# Patient Record
Sex: Male | Born: 1945 | Race: White | Hispanic: No | Marital: Married | Smoking: Never smoker
Health system: Southern US, Community
[De-identification: ages and names within clinical notes are randomized; demographics above are authoritative.]

## PROBLEM LIST (undated history)

## (undated) DIAGNOSIS — N200 Calculus of kidney: Secondary | ICD-10-CM

## (undated) DIAGNOSIS — N183 Chronic kidney disease, stage 3 unspecified: Secondary | ICD-10-CM

## (undated) DIAGNOSIS — G4730 Sleep apnea, unspecified: Secondary | ICD-10-CM

## (undated) DIAGNOSIS — I10 Essential (primary) hypertension: Secondary | ICD-10-CM

## (undated) HISTORY — PX: MANDIBLE FRACTURE SURGERY: SHX706

## (undated) HISTORY — PX: CATARACT EXTRACTION: SUR2

## (undated) HISTORY — PX: OTHER SURGICAL HISTORY: SHX169

---

## 2019-05-25 DIAGNOSIS — I10 Essential (primary) hypertension: Secondary | ICD-10-CM | POA: Diagnosis present

## 2019-06-12 ENCOUNTER — Ambulatory Visit: Payer: Medicare HMO | Admitting: Psychology

## 2019-07-20 DIAGNOSIS — F411 Generalized anxiety disorder: Secondary | ICD-10-CM | POA: Insufficient documentation

## 2021-02-10 ENCOUNTER — Encounter (HOSPITAL_BASED_OUTPATIENT_CLINIC_OR_DEPARTMENT_OTHER): Payer: Self-pay | Admitting: Emergency Medicine

## 2021-02-10 ENCOUNTER — Other Ambulatory Visit: Payer: Self-pay

## 2021-02-10 ENCOUNTER — Emergency Department (HOSPITAL_BASED_OUTPATIENT_CLINIC_OR_DEPARTMENT_OTHER): Payer: Medicare (Managed Care)

## 2021-02-10 ENCOUNTER — Emergency Department (HOSPITAL_BASED_OUTPATIENT_CLINIC_OR_DEPARTMENT_OTHER)
Admission: EM | Admit: 2021-02-10 | Discharge: 2021-02-10 | Disposition: A | Discharge status: Home / Self Care | Payer: Medicare (Managed Care) | Attending: Emergency Medicine | Admitting: Emergency Medicine

## 2021-02-10 DIAGNOSIS — R109 Unspecified abdominal pain: Secondary | ICD-10-CM | POA: Diagnosis present

## 2021-02-10 DIAGNOSIS — N202 Calculus of kidney with calculus of ureter: Secondary | ICD-10-CM | POA: Diagnosis not present

## 2021-02-10 DIAGNOSIS — Z87442 Personal history of urinary calculi: Secondary | ICD-10-CM | POA: Insufficient documentation

## 2021-02-10 DIAGNOSIS — I10 Essential (primary) hypertension: Secondary | ICD-10-CM | POA: Insufficient documentation

## 2021-02-10 DIAGNOSIS — N200 Calculus of kidney: Secondary | ICD-10-CM

## 2021-02-10 HISTORY — DX: Sleep apnea, unspecified: G47.30

## 2021-02-10 HISTORY — DX: Essential (primary) hypertension: I10

## 2021-02-10 HISTORY — DX: Calculus of kidney: N20.0

## 2021-02-10 LAB — CBC WITH DIFFERENTIAL/PLATELET
Abs Immature Granulocytes: 0.02 10*3/uL (ref 0.00–0.07)
Basophils Absolute: 0 10*3/uL (ref 0.0–0.1)
Basophils Relative: 1 %
Eosinophils Absolute: 0.2 10*3/uL (ref 0.0–0.5)
Eosinophils Relative: 3 %
HCT: 46 % (ref 39.0–52.0)
Hemoglobin: 15 g/dL (ref 13.0–17.0)
Immature Granulocytes: 0 %
Lymphocytes Relative: 19 %
Lymphs Abs: 1.5 10*3/uL (ref 0.7–4.0)
MCH: 28.7 pg (ref 26.0–34.0)
MCHC: 32.6 g/dL (ref 30.0–36.0)
MCV: 88.1 fL (ref 80.0–100.0)
Monocytes Absolute: 0.7 10*3/uL (ref 0.1–1.0)
Monocytes Relative: 8 %
Neutro Abs: 5.5 10*3/uL (ref 1.7–7.7)
Neutrophils Relative %: 69 %
Platelets: 167 10*3/uL (ref 150–400)
RBC: 5.22 MIL/uL (ref 4.22–5.81)
RDW: 14.6 % (ref 11.5–15.5)
WBC: 7.9 10*3/uL (ref 4.0–10.5)
nRBC: 0 % (ref 0.0–0.2)

## 2021-02-10 LAB — URINALYSIS, ROUTINE W REFLEX MICROSCOPIC
Bilirubin Urine: NEGATIVE
Glucose, UA: NEGATIVE mg/dL
Ketones, ur: 15 mg/dL — AB
Nitrite: NEGATIVE
Protein, ur: >300 mg/dL — AB
RBC / HPF: >50 RBC/hpf — ABNORMAL HIGH (ref 0–5)
Specific Gravity, Urine: 1.025 (ref 1.005–1.030)
WBC, UA: >50 WBC/hpf — ABNORMAL HIGH (ref 0–5)
pH: 7 (ref 5.0–8.0)

## 2021-02-10 LAB — BASIC METABOLIC PANEL
Anion gap: 10 (ref 5–15)
BUN: 21 mg/dL (ref 8–23)
CO2: 27 mmol/L (ref 22–32)
Calcium: 9.3 mg/dL (ref 8.9–10.3)
Chloride: 103 mmol/L (ref 98–111)
Creatinine, Ser: 1.55 mg/dL — ABNORMAL HIGH (ref 0.61–1.24)
GFR, Estimated: 47 mL/min — ABNORMAL LOW (ref >60)
Glucose, Bld: 87 mg/dL (ref 70–99)
Potassium: 3.8 mmol/L (ref 3.5–5.1)
Sodium: 140 mmol/L (ref 135–145)

## 2021-02-10 IMAGING — CT CT RENAL STONE PROTOCOL
2 of 4 series · 16 of 46 positions shown, 18 images · non-contrast
Comparison: None.

CLINICAL DATA: Bilateral flank pain.  Hematuria.

EXAM:
CT ABDOMEN AND PELVIS WITHOUT CONTRAST
TECHNIQUE: Multidetector CT imaging of the abdomen and pelvis was performed
following the standard protocol without IV contrast.

[Series 2: stone full · axial · 0.91mm/px · z∈[+830,+1255]mm · 13 of 93 slices shown, 15 images]
[im 4/93  soft-tissue]
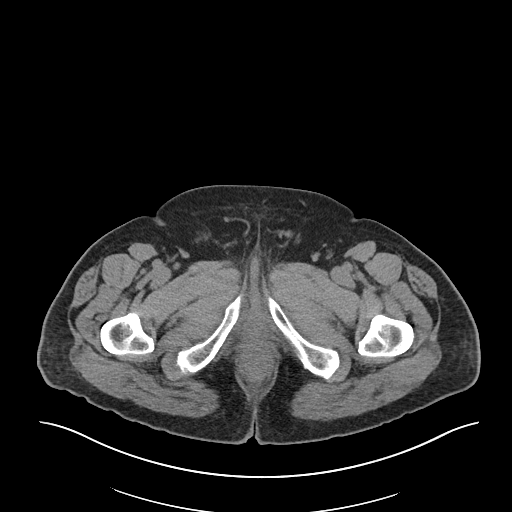
[im 4/93  bone]
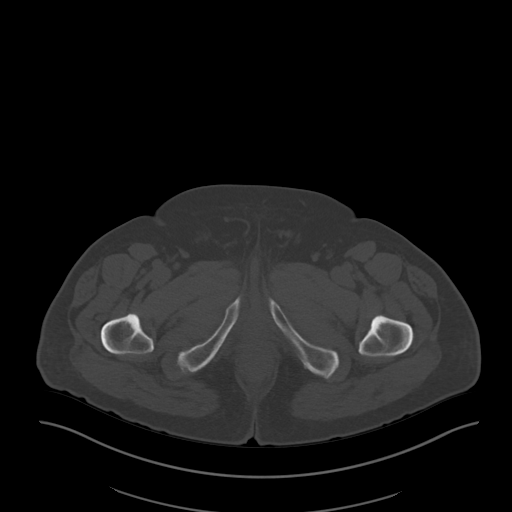
[im 12/93  soft-tissue]
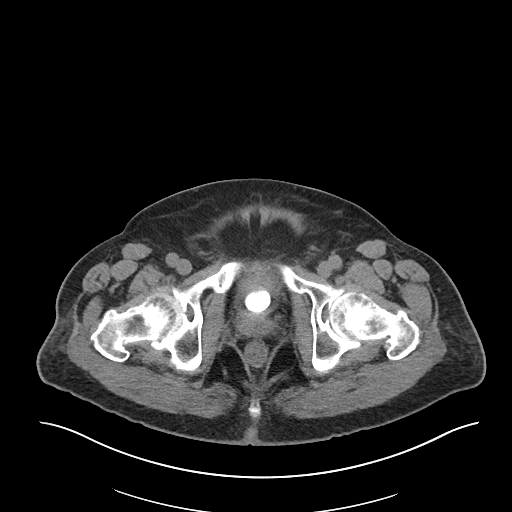
[im 20/93  soft-tissue]
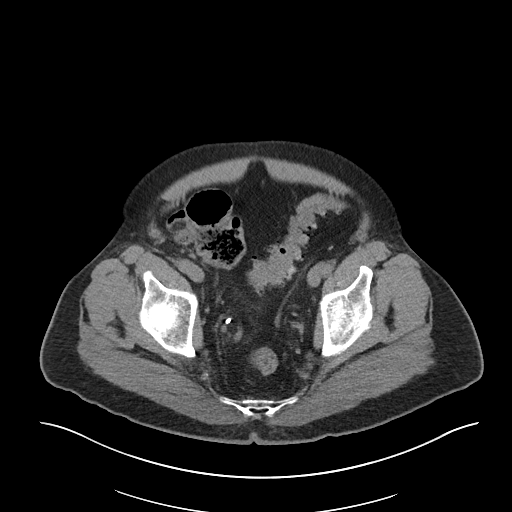
[im 27/93  soft-tissue]
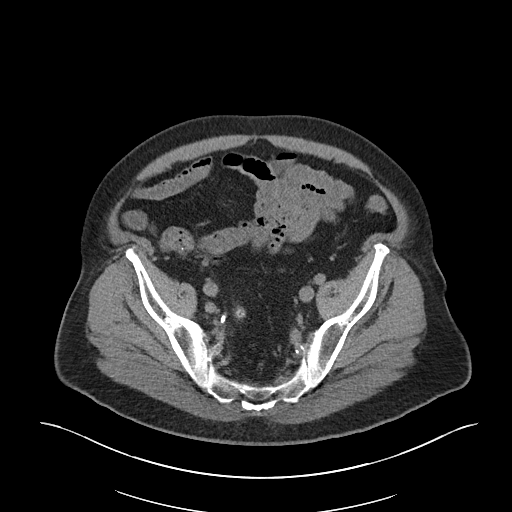
[im 31/93  soft-tissue]
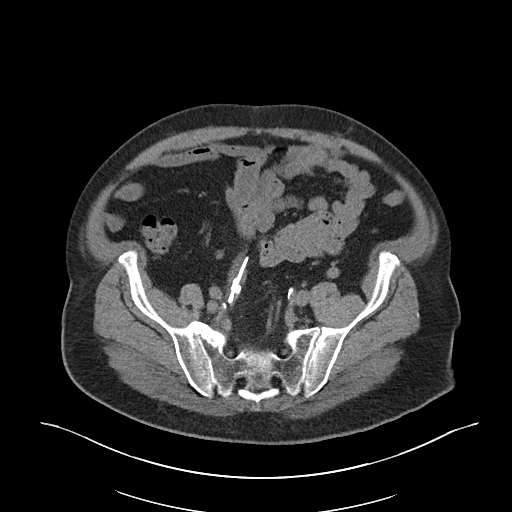
[im 39/93  soft-tissue]
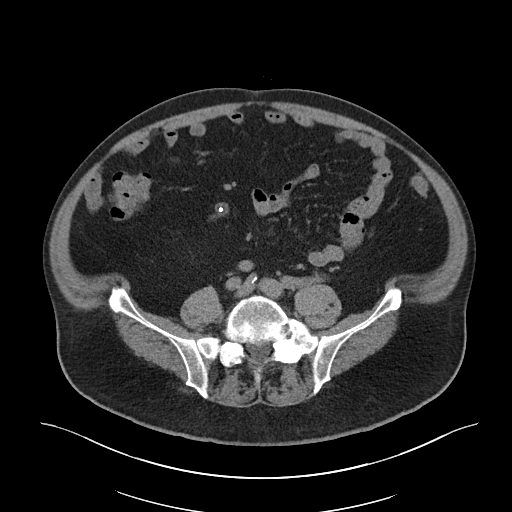
[im 47/93  soft-tissue]
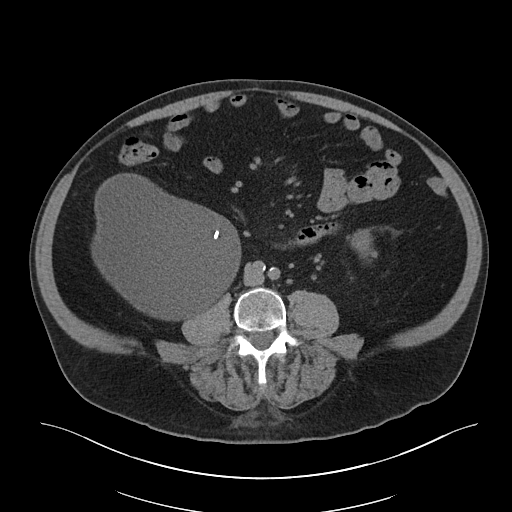
[im 54/93  soft-tissue]
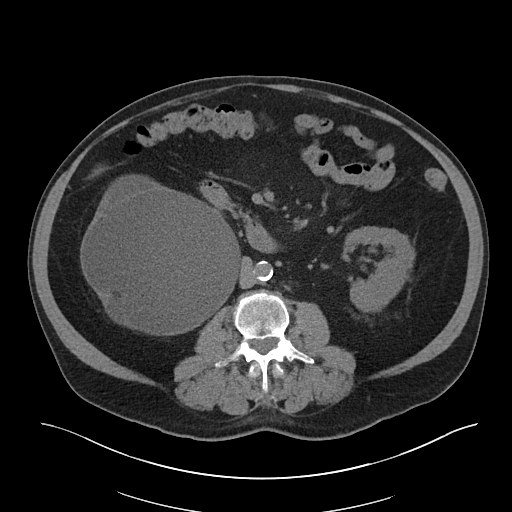
[im 62/93  soft-tissue]
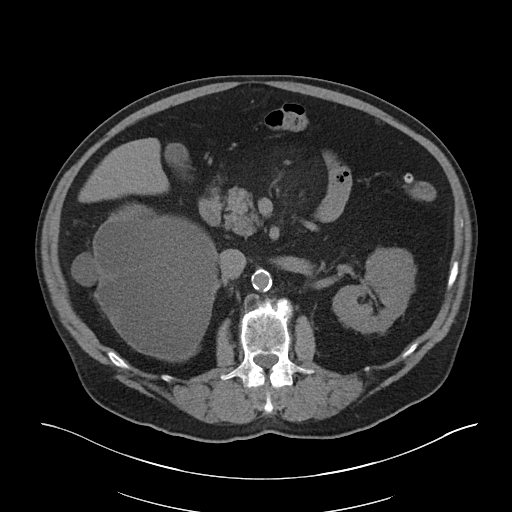
[im 62/93  bone]
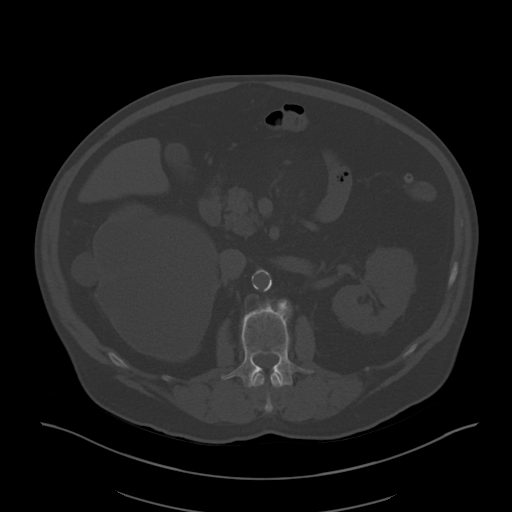
[im 66/93  soft-tissue]
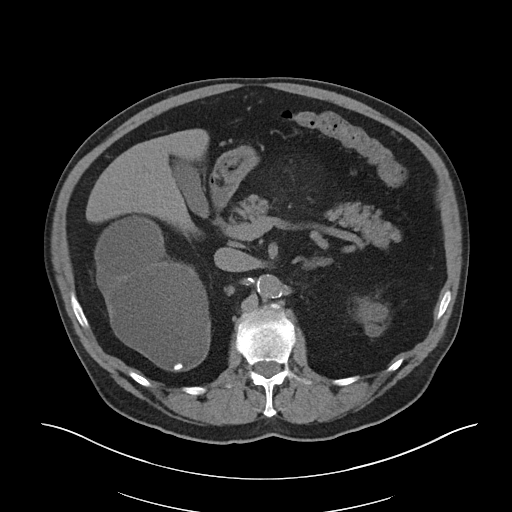
[im 73/93  soft-tissue]
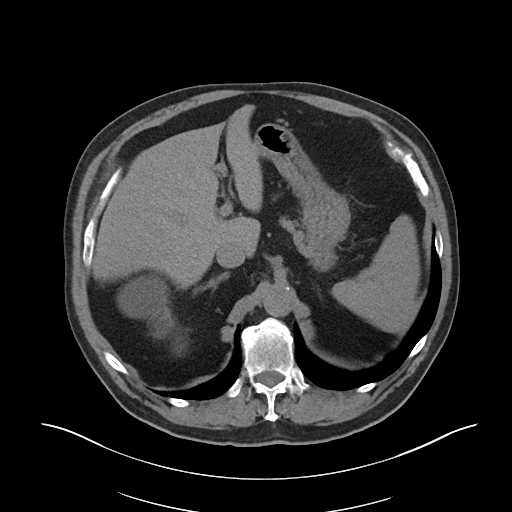
[im 81/93  soft-tissue]
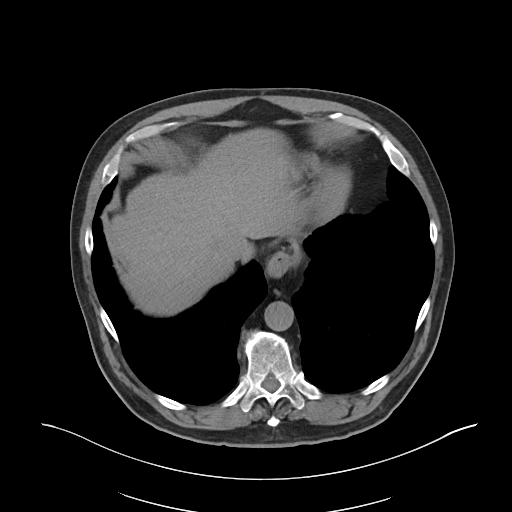
[im 89/93  soft-tissue]
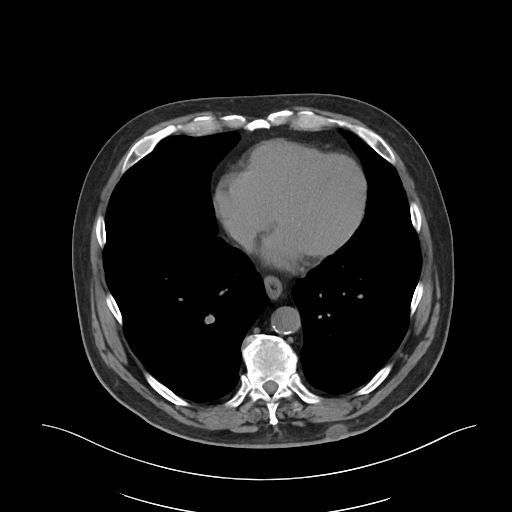

[Series 5: coronal · coronal · 0.92mm/px · 3 of 113 slices shown]
[im 38/113  soft-tissue]
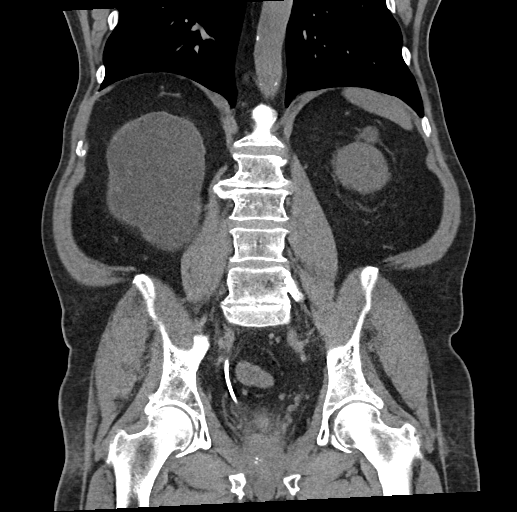
[im 50/113  soft-tissue]
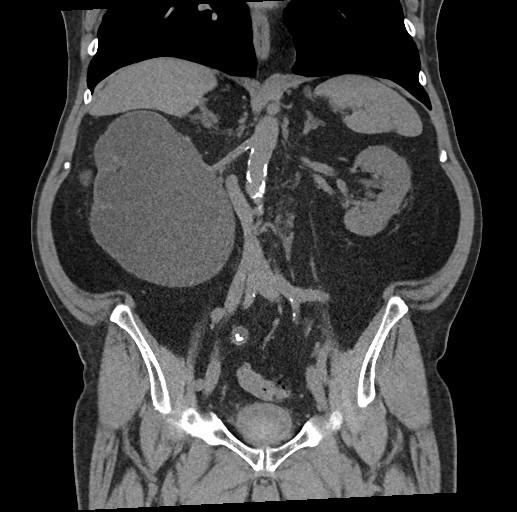
[im 63/113  soft-tissue]
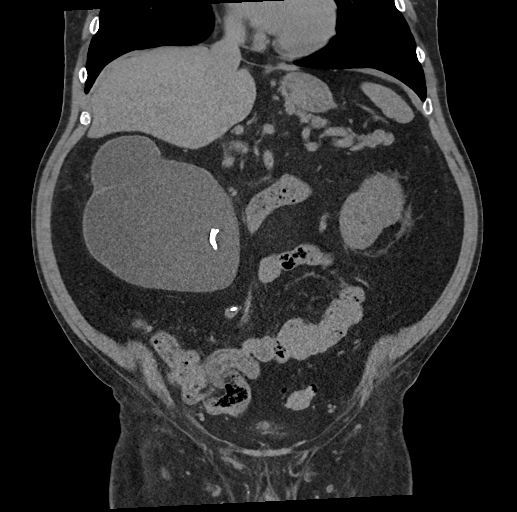

[16 of 46 positions shown; findings below may reference images not displayed]

FINDINGS: Lower chest: No acute findings.

Hepatobiliary: No mass visualized on this unenhanced exam.
Gallbladder is unremarkable. No evidence of biliary ductal
dilatation.

Pancreas: No mass or inflammatory process visualized on this
unenhanced exam.

Spleen:  Within normal limits in size.

Adrenals/Urinary tract: A few tiny fluid attenuation and hemorrhagic
renal cysts are noted. Severe right hydroureteronephrosis is seen
with diffuse right renal parenchymal atrophy. A right ureteral stent
is seen in appropriate position. Several calculi are seen adjacent
to the stent in the mid right ureter measuring up to 6 mm. An 8 mm
nonobstructing calculus is seen in the right renal collecting
system. A 2.8 cm calculus is seen in the urinary bladder, which has
formed around the distal pigtail loop of the ureteral stent.

Stomach/Bowel: No evidence of obstruction, inflammatory process, or
abnormal fluid collections. Diverticulosis is seen mainly involving
the descending and sigmoid colon, however there is no evidence of
diverticulitis.

Vascular/Lymphatic: No pathologically enlarged lymph nodes
identified. No evidence of abdominal aortic aneurysm. Aortic
atherosclerotic calcification noted.

Reproductive:  No mass or other significant abnormality.

Other:  None.

Musculoskeletal:  No suspicious bone lesions identified.
IMPRESSION: Severe right hydroureteronephrosis with diffuse right renal
parenchymal atrophy.

Right ureteral stent in appropriate position, with several calculi
in the mid right ureter measuring up to 6 mm.

Large urinary bladder calculus which is formed around the distal
pigtail loop of the ureteral stent. Stent malfunction cannot be
excluded.

Colonic diverticulosis, without radiographic evidence of
diverticulitis.

Aortic Atherosclerosis ([XL]-[XL]).

## 2021-02-10 MED ORDER — HYDROCODONE-ACETAMINOPHEN 5-325 MG PO TABS: 1.0000 | ORAL_TABLET | ORAL | 0 refills | Status: DC | PRN

## 2021-02-10 MED ORDER — SODIUM CHLORIDE 0.9 % IV SOLN
1.0000 g | Freq: Once | INTRAVENOUS | Status: AC
  Administered 2021-02-10: 1 g via INTRAVENOUS
  Filled 2021-02-10: qty 10

## 2021-02-10 MED ORDER — CEPHALEXIN 500 MG PO CAPS: 500.0000 mg | ORAL_CAPSULE | Freq: Two times a day (BID) | ORAL | 0 refills | Status: DC

## 2021-02-10 NOTE — ED Provider Notes (Signed)
[facility] EMERGENCY DEPT Provider Note   CSN: [id number] Arrival date & time: 02/10/21  1358     History Chief Complaint  Patient presents with   Flank Pain   Hematuria    Nicholas Mckenzie is a 75 y.o. male.  Patient is a 75 year old male who presents with hematuria.  He has a history of hypertension and prior kidney stones.  He states that over the last 4 days he has had some blood in his urine and pain on urination.  He denies any associated abdominal pain.  He does have some achiness across his low back.  No fevers.  No nausea or vomiting.  No dizziness.  He does have a prior history of kidney stones.  He states he was previously seen a urologist in [place] and he did have a kidney stone about 3 years ago where he had to have a stent placed.  He reports that the stent is still in place and he never had it removed once he moved here.  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      Past Medical History:  Diagnosis Date   Hypertension    Kidney stones    Sleep apnea     There are no problems to display for this patient.   Past Surgical History:  Procedure Laterality Date   CATARACT EXTRACTION     kidney stent     MANDIBLE FRACTURE SURGERY         No family history on file.     Home Medications Prior to Admission medications   Medication Sig Start Date End Date Taking? Authorizing Provider  cephALEXin (KEFLEX) 500 MG capsule Take 1 capsule (500 mg total) by mouth 2 (two) times daily. 02/10/21  Yes Rolan Bucco, MD  HYDROcodone-acetaminophen (NORCO/VICODIN) 5-325 MG tablet Take 1 tablet by mouth every 4 (four) hours as needed. 02/10/21  Yes Rolan Bucco, MD    Allergies    Patient has no known allergies.  Review of Systems   Review of Systems  Constitutional:  Negative for chills, diaphoresis, fatigue and fever.  HENT:  Negative for congestion, rhinorrhea and sneezing.   Eyes: Negative.    Respiratory:  Negative for cough, chest tightness and shortness of breath.   Cardiovascular:  Negative for chest pain and leg swelling.  Gastrointestinal:  Negative for abdominal pain, blood in stool, diarrhea, nausea and vomiting.  Genitourinary:  Positive for difficulty urinating and hematuria. Negative for flank pain and frequency.  Musculoskeletal:  Positive for back pain. Negative for arthralgias.  Skin:  Negative for rash.  Neurological:  Negative for dizziness, speech difficulty, weakness, numbness and headaches.   Physical Exam Updated Vital Signs BP (!) 170/90   Pulse (!) 45   Temp 98.1 F (36.7 C) (Oral)   Resp 18   Ht 5\' 8"  (1.727 m)   Wt 104.3 kg   SpO2 97%   BMI 34.97 kg/m   Physical Exam Constitutional:      Appearance: He is well-developed.  HENT:     Head: Normocephalic and atraumatic.  Eyes:     Pupils: Pupils are equal, round, and reactive to light.  Cardiovascular:     Rate and Rhythm: Normal rate and regular rhythm.     Heart sounds: Normal heart sounds.  Pulmonary:     Effort: Pulmonary effort is normal. No respiratory distress.     Breath sounds: Normal breath sounds. No wheezing or rales.  Chest:     Chest wall: No tenderness.  Abdominal:     General: Bowel sounds are normal.     Palpations: Abdomen is soft.     Tenderness: There is no abdominal tenderness. There is no guarding or rebound.  Musculoskeletal:        General: Normal range of motion.     Cervical back: Normal range of motion and neck supple.  Lymphadenopathy:     Cervical: No cervical adenopathy.  Skin:    General: Skin is warm and dry.     Findings: No rash.  Neurological:     Mental Status: He is alert and oriented to person, place, and time.    ED Results / Procedures / Treatments   Labs (all labs ordered are listed, but only abnormal results are displayed) Labs Reviewed  URINALYSIS, ROUTINE W REFLEX MICROSCOPIC - Abnormal; Notable for the following components:       Result Value   APPearance CLOUDY (*)    Hgb urine dipstick MODERATE (*)    Ketones, ur 15 (*)    Protein, ur >300 (*)    Leukocytes,Ua LARGE (*)    RBC / HPF >50 (*)    WBC, UA >50 (*)    Bacteria, UA MANY (*)    All other components within normal limits  BASIC METABOLIC PANEL - Abnormal; Notable for the following components:   Creatinine, Ser 1.55 (*)    GFR, Estimated 47 (*)    All other components within normal limits  CBC WITH DIFFERENTIAL/PLATELET    EKG None  Radiology CT Renal Stone Study  Result Date: 02/10/2021 CLINICAL DATA:  Bilateral flank pain.  Hematuria. EXAM: CT ABDOMEN AND PELVIS WITHOUT CONTRAST TECHNIQUE: Multidetector CT imaging of the abdomen and pelvis was performed following the standard protocol without IV contrast. COMPARISON:  None. FINDINGS: Lower chest: No acute findings. Hepatobiliary: No mass visualized on this unenhanced exam. Gallbladder is unremarkable. No evidence of biliary ductal dilatation. Pancreas: No mass or inflammatory process visualized on this unenhanced exam. Spleen:  Within normal limits in size. Adrenals/Urinary tract: A few tiny fluid attenuation and hemorrhagic renal cysts are noted. Severe right hydroureteronephrosis is seen with diffuse right renal parenchymal atrophy. A right ureteral stent is seen in appropriate position. Several calculi are seen adjacent to the stent in the mid right ureter measuring up to 6 mm. An 8 mm nonobstructing calculus is seen in the right renal collecting system. A 2.8 cm calculus is seen in the urinary bladder, which has formed around the distal pigtail loop of the ureteral stent. Stomach/Bowel: No evidence of obstruction, inflammatory process, or abnormal fluid collections. Diverticulosis is seen mainly involving the descending and sigmoid colon, however there is no evidence of diverticulitis. Vascular/Lymphatic: No pathologically enlarged lymph nodes identified. No evidence of abdominal aortic aneurysm. Aortic  atherosclerotic calcification noted. Reproductive:  No mass or other significant abnormality. Other:  None. Musculoskeletal:  No suspicious bone lesions identified. IMPRESSION: Severe right hydroureteronephrosis with diffuse right renal parenchymal atrophy. Right ureteral stent in appropriate position, with several calculi in the mid right ureter measuring up to 6 mm. Large urinary bladder calculus which is formed around the distal pigtail loop of the ureteral stent. Stent malfunction cannot be excluded. Colonic diverticulosis, without radiographic evidence of diverticulitis. Aortic Atherosclerosis (ICD10-I70.0). Electronically Signed   By: Danae Orleans M.D.   On: 02/10/2021 17:12    Procedures Procedures   Medications Ordered in ED Medications  cefTRIAXone (ROCEPHIN) 1 g in sodium chloride  0.9 % 100 mL IVPB (1 g Intravenous New Bag/Given 02/10/21 1819)    ED Course  I have reviewed the triage vital signs and the nursing notes.  Pertinent labs & imaging results that were available during my care of the patient were reviewed by me and considered in my medical decision making (see chart for details).    MDM Rules/Calculators/A&P                           Patient is a 75 year old male who presents with hematuria and back pain.  His urine does show suggestions of infection.  His creatinine is 1.55 which is similar to his prior value from July of 1.46.  His hemoglobin is stable.  He is afebrile.  His white count is normal.  CT scan shows multiple stones in the right ureter with severe hydronephrosis.  A stent is still in place with some calcifications distally.  I spoke with Dr. Berneice Heinrich with urology.  He feels that patient can be discharged home with outpatient follow-up.  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  He does not have a fever or elevated white count.  He was started on antibiotics.  He was given dose Rocephin here.  He was given prescription for Vicodin.  He was given strict return  precautions and strict instructions to have close follow-up with urology. Final Clinical Impression(s) / ED Diagnoses Final diagnoses:  Kidney stone    Rx / DC Orders ED Discharge Orders          Ordered    HYDROcodone-acetaminophen (NORCO/VICODIN) 5-325 MG tablet  Every 4 hours PRN        02/10/21 1909    cephALEXin (KEFLEX) 500 MG capsule  2 times daily        02/10/21 1909             Rolan Bucco, MD 02/10/21 1911

## 2021-02-10 NOTE — ED Triage Notes (Signed)
Patient presents ambulatory c/o bilateral flank pain, hematuria, and hypertension since Tuesday. 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

## 2021-02-10 NOTE — Discharge Instructions (Addendum)
Call on Monday to schedule an appointment with the urologist.  Take the antibiotics as directed.  Return here as needed if you have any worsening symptoms.

## 2021-03-10 ENCOUNTER — Encounter (HOSPITAL_COMMUNITY): Payer: Self-pay

## 2021-03-10 ENCOUNTER — Observation Stay (HOSPITAL_COMMUNITY)
Admission: EM | Admit: 2021-03-10 | Discharge: 2021-03-12 | Disposition: A | Discharge status: Home / Self Care | Payer: Medicare (Managed Care) | Attending: Internal Medicine | Admitting: Internal Medicine

## 2021-03-10 DIAGNOSIS — Z20822 Contact with and (suspected) exposure to covid-19: Secondary | ICD-10-CM | POA: Diagnosis not present

## 2021-03-10 DIAGNOSIS — N1831 Chronic kidney disease, stage 3a: Secondary | ICD-10-CM | POA: Insufficient documentation

## 2021-03-10 DIAGNOSIS — Z79899 Other long term (current) drug therapy: Secondary | ICD-10-CM | POA: Insufficient documentation

## 2021-03-10 DIAGNOSIS — N1832 Chronic kidney disease, stage 3b: Secondary | ICD-10-CM | POA: Diagnosis present

## 2021-03-10 DIAGNOSIS — T424X1A Poisoning by benzodiazepines, accidental (unintentional), initial encounter: Secondary | ICD-10-CM | POA: Diagnosis not present

## 2021-03-10 DIAGNOSIS — Y900 Blood alcohol level of less than 20 mg/100 ml: Secondary | ICD-10-CM | POA: Insufficient documentation

## 2021-03-10 DIAGNOSIS — I129 Hypertensive chronic kidney disease with stage 1 through stage 4 chronic kidney disease, or unspecified chronic kidney disease: Secondary | ICD-10-CM | POA: Insufficient documentation

## 2021-03-10 DIAGNOSIS — T50911A Poisoning by multiple unspecified drugs, medicaments and biological substances, accidental (unintentional), initial encounter: Secondary | ICD-10-CM | POA: Diagnosis present

## 2021-03-10 DIAGNOSIS — R001 Bradycardia, unspecified: Secondary | ICD-10-CM | POA: Diagnosis present

## 2021-03-10 DIAGNOSIS — F3341 Major depressive disorder, recurrent, in partial remission: Secondary | ICD-10-CM

## 2021-03-10 DIAGNOSIS — I10 Essential (primary) hypertension: Secondary | ICD-10-CM | POA: Diagnosis present

## 2021-03-10 DIAGNOSIS — N183 Chronic kidney disease, stage 3 unspecified: Secondary | ICD-10-CM | POA: Diagnosis present

## 2021-03-10 DIAGNOSIS — G4730 Sleep apnea, unspecified: Secondary | ICD-10-CM | POA: Diagnosis present

## 2021-03-10 LAB — COMPREHENSIVE METABOLIC PANEL
ALT: 23 U/L (ref 0–44)
AST: 22 U/L (ref 15–41)
Albumin: 3.8 g/dL (ref 3.5–5.0)
Alkaline Phosphatase: 75 U/L (ref 38–126)
Anion gap: 8 (ref 5–15)
BUN: 26 mg/dL — ABNORMAL HIGH (ref 8–23)
CO2: 26 mmol/L (ref 22–32)
Calcium: 8.9 mg/dL (ref 8.9–10.3)
Chloride: 105 mmol/L (ref 98–111)
Creatinine, Ser: 1.45 mg/dL — ABNORMAL HIGH (ref 0.61–1.24)
GFR, Estimated: 51 mL/min — ABNORMAL LOW (ref >60)
Glucose, Bld: 96 mg/dL (ref 70–99)
Potassium: 3.7 mmol/L (ref 3.5–5.1)
Sodium: 139 mmol/L (ref 135–145)
Total Bilirubin: 0.5 mg/dL (ref 0.3–1.2)
Total Protein: 6.5 g/dL (ref 6.5–8.1)

## 2021-03-10 LAB — RAPID URINE DRUG SCREEN, HOSP PERFORMED
Amphetamines: NOT DETECTED
Barbiturates: NOT DETECTED
Benzodiazepines: POSITIVE — AB
Cocaine: NOT DETECTED
Opiates: NOT DETECTED
Tetrahydrocannabinol: NOT DETECTED

## 2021-03-10 LAB — CBC WITH DIFFERENTIAL/PLATELET
Abs Immature Granulocytes: 0.04 10*3/uL (ref 0.00–0.07)
Basophils Absolute: 0 10*3/uL (ref 0.0–0.1)
Basophils Relative: 0 %
Eosinophils Absolute: 0.2 10*3/uL (ref 0.0–0.5)
Eosinophils Relative: 3 %
HCT: 41 % (ref 39.0–52.0)
Hemoglobin: 13.2 g/dL (ref 13.0–17.0)
Immature Granulocytes: 1 %
Lymphocytes Relative: 20 %
Lymphs Abs: 1.3 10*3/uL (ref 0.7–4.0)
MCH: 29.1 pg (ref 26.0–34.0)
MCHC: 32.2 g/dL (ref 30.0–36.0)
MCV: 90.5 fL (ref 80.0–100.0)
Monocytes Absolute: 0.6 10*3/uL (ref 0.1–1.0)
Monocytes Relative: 8 %
Neutro Abs: 4.7 10*3/uL (ref 1.7–7.7)
Neutrophils Relative %: 68 %
Platelets: 185 10*3/uL (ref 150–400)
RBC: 4.53 MIL/uL (ref 4.22–5.81)
RDW: 14.6 % (ref 11.5–15.5)
WBC: 6.9 10*3/uL (ref 4.0–10.5)
nRBC: 0 % (ref 0.0–0.2)

## 2021-03-10 LAB — ACETAMINOPHEN LEVEL: Acetaminophen (Tylenol), Serum: <10 ug/mL — ABNORMAL LOW (ref 10–30)

## 2021-03-10 LAB — ETHANOL: Alcohol, Ethyl (B): <10 mg/dL (ref <10)

## 2021-03-10 LAB — SALICYLATE LEVEL: Salicylate Lvl: <7 mg/dL — ABNORMAL LOW (ref 7.0–30.0)

## 2021-03-10 MED ORDER — LACTATED RINGERS IV BOLUS
1000.0000 mL | Freq: Once | INTRAVENOUS | Status: AC
  Administered 2021-03-10: 1000 mL via INTRAVENOUS

## 2021-03-10 NOTE — ED Triage Notes (Signed)
Pt presents to the ED via EMS from [employer]. Per EMS, the pt was in the store and "watched the pt turn the bottle up" of 30mg  Temazepam tablets of a 3/4 of full bottle which is now empty. The pt denies he was not attempting to harm himself he "just wanted to go home."

## 2021-03-10 NOTE — ED Notes (Addendum)
Pt de-satted to 60% on room air with good pleth while sleeping. Pt sternal rubbed and arousable to pain. Pt's O2 increased to mid 90's on room air following this episode while awake, however, pt falls back asleep and placed on 6L [place] O2 at this time. ED provider notified.

## 2021-03-10 NOTE — ED Provider Notes (Addendum)
[facility]-EMERGENCY DEPT Provider Note   CSN: [id number] Arrival date & time: 03/10/21  1630     History Chief Complaint  Patient presents with   Drug Overdose    Temazepam OD intentional    Nicholas Mckenzie is a 75 y.o. male who presents emergency department for evaluation of an unintentional drug overdose.  History obtained from patient's partner as the patient is currently altered.  She states that at approximately 3 PM today the patient took an entire bottle of a combination of his home medications that primarily included temazepam but also may have included metoprolol succinate, amlodipine, fluoxetine, or valsartan.  The patient also consumed a single beer at the grocery store earlier today.  He presents significantly drowsy, bradycardic into the upper 40s, low 50s but hemodynamically stable and arousable to loud voice.  Denies chest pain, abdominal pain, nausea, vomiting, diarrhea or other systemic symptoms.   Drug Overdose      Past Medical History:  Diagnosis Date   Hypertension    Kidney stones    Sleep apnea     There are no problems to display for this patient.   Past Surgical History:  Procedure Laterality Date   CATARACT EXTRACTION     kidney stent     MANDIBLE FRACTURE SURGERY         History reviewed. No pertinent family history.     Home Medications Prior to Admission medications   Medication Sig Start Date End Date Taking? Authorizing Provider  cephALEXin (KEFLEX) 500 MG capsule Take 1 capsule (500 mg total) by mouth 2 (two) times daily. 02/10/21   Rolan Bucco, MD  HYDROcodone-acetaminophen (NORCO/VICODIN) 5-325 MG tablet Take 1 tablet by mouth every 4 (four) hours as needed. 02/10/21   Rolan Bucco, MD    Allergies    Patient has no known allergies.  Review of Systems   Review of Systems  Unable to perform ROS: Mental status change   Physical Exam Updated Vital Signs BP (!) 178/86   Pulse (!) 50   Temp 98.4 F (36.9  C) (Oral)   Resp (!) 22   SpO2 98%   Physical Exam Vitals and nursing note reviewed.  Constitutional:      Appearance: He is well-developed.  HENT:     Head: Normocephalic and atraumatic.  Eyes:     Conjunctiva/sclera: Conjunctivae normal.  Cardiovascular:     Rate and Rhythm: Regular rhythm. Bradycardia present.     Heart sounds: No murmur heard. Pulmonary:     Effort: Pulmonary effort is normal. No respiratory distress.     Breath sounds: Normal breath sounds.  Abdominal:     Palpations: Abdomen is soft.     Tenderness: There is no abdominal tenderness.  Musculoskeletal:     Cervical back: Neck supple.  Skin:    General: Skin is warm and dry.  Neurological:     Mental Status: He is alert. He is disoriented.     Cranial Nerves: No cranial nerve deficit.     Sensory: No sensory deficit.     Motor: No weakness.    ED Results / Procedures / Treatments   Labs (all labs ordered are listed, but only abnormal results are displayed) Labs Reviewed  COMPREHENSIVE METABOLIC PANEL - Abnormal; Notable for the following components:      Result Value   BUN 26 (*)    Creatinine, Ser 1.45 (*)    GFR, Estimated 51 (*)    All other components within normal  limits  ACETAMINOPHEN LEVEL - Abnormal; Notable for the following components:   Acetaminophen (Tylenol), Serum <10 (*)    All other components within normal limits  SALICYLATE LEVEL - Abnormal; Notable for the following components:   Salicylate Lvl <7.0 (*)    All other components within normal limits  RAPID URINE DRUG SCREEN, HOSP PERFORMED - Abnormal; Notable for the following components:   Benzodiazepines POSITIVE (*)    All other components within normal limits  CBC WITH DIFFERENTIAL/PLATELET  ETHANOL    EKG None  Radiology No results found.  Procedures Procedures   Medications Ordered in ED Medications  lactated ringers bolus 1,000 mL (1,000 mLs Intravenous New Bag/Given 03/10/21 1814)    ED Course  I have  reviewed the triage vital signs and the nursing notes.  Pertinent labs & imaging results that were available during my care of the patient were reviewed by me and considered in my medical decision making (see chart for details).   MDM Rules/Calculators/A&P                           Patient seen in the emergency department for evaluation of an unintentional drug overdose.  Exam reveals a regular bradycardia but is otherwise unremarkable.  Laboratory evaluation with negative salicylate and acetaminophen levels, creatinine elevation 1.45, BUN 26 but is otherwise unremarkable.  UDS with positive benzodiazepines but otherwise unremarkable.  On reevaluation, patient's mental status is improving but patient remains persistently bradycardic.  Due to patient's elderly status, persistent altered mental status despite significant observation time in the emergency department and persistent bradycardia, patient will require admission for observation for suspected benzodiazepine and beta-blocker overdose. Final Clinical Impression(s) / ED Diagnoses Final diagnoses:  None    Rx / DC Orders ED Discharge Orders     None        Winnie Barsky, Wyn Forster, MD 03/11/21 0015    Glendora Score, MD 03/11/21 0030

## 2021-03-10 NOTE — ED Notes (Addendum)
This nurse contacted Winnifred Friar., RN with Poison Control who recommends 4hr Acetaminophen level and observe until pt is back to baseline mentation. Observing for drowsiness, mainly respiratory depression. No additional recommendations from Poison Control at this time. Per Poison Control, they are to follow-up around 2100pm tonight. The above information was relayed to EDP Dr. Posey Rea.

## 2021-03-10 NOTE — ED Notes (Signed)
ED provider at the bedside.  

## 2021-03-10 NOTE — ED Notes (Signed)
This nurse attempted to straight cath pt for urine. Pt has a "difficult prostate" as well as a ureteral stent. Unsuccessful with obtaining urine sample with straight cath. ED provider notified.

## 2021-03-10 NOTE — ED Notes (Signed)
ED provider notified.

## 2021-03-11 ENCOUNTER — Encounter (HOSPITAL_COMMUNITY): Payer: Self-pay | Admitting: Internal Medicine

## 2021-03-11 DIAGNOSIS — I10 Essential (primary) hypertension: Secondary | ICD-10-CM

## 2021-03-11 DIAGNOSIS — F3341 Major depressive disorder, recurrent, in partial remission: Secondary | ICD-10-CM

## 2021-03-11 DIAGNOSIS — N183 Chronic kidney disease, stage 3 unspecified: Secondary | ICD-10-CM | POA: Diagnosis present

## 2021-03-11 DIAGNOSIS — N1832 Chronic kidney disease, stage 3b: Secondary | ICD-10-CM | POA: Diagnosis present

## 2021-03-11 DIAGNOSIS — T50911A Poisoning by multiple unspecified drugs, medicaments and biological substances, accidental (unintentional), initial encounter: Secondary | ICD-10-CM | POA: Diagnosis present

## 2021-03-11 DIAGNOSIS — G4730 Sleep apnea, unspecified: Secondary | ICD-10-CM

## 2021-03-11 DIAGNOSIS — R001 Bradycardia, unspecified: Secondary | ICD-10-CM | POA: Diagnosis present

## 2021-03-11 LAB — BASIC METABOLIC PANEL
Anion gap: 9 (ref 5–15)
BUN: 19 mg/dL (ref 8–23)
CO2: 27 mmol/L (ref 22–32)
Calcium: 9 mg/dL (ref 8.9–10.3)
Chloride: 105 mmol/L (ref 98–111)
Creatinine, Ser: 1.12 mg/dL (ref 0.61–1.24)
GFR, Estimated: >60 mL/min (ref >60)
Glucose, Bld: 115 mg/dL — ABNORMAL HIGH (ref 70–99)
Potassium: 3.3 mmol/L — ABNORMAL LOW (ref 3.5–5.1)
Sodium: 141 mmol/L (ref 135–145)

## 2021-03-11 LAB — RESP PANEL BY RT-PCR (FLU A&B, COVID) ARPGX2
Influenza A by PCR: NEGATIVE
Influenza B by PCR: NEGATIVE
SARS Coronavirus 2 by RT PCR: NEGATIVE

## 2021-03-11 LAB — MAGNESIUM: Magnesium: 2.2 mg/dL (ref 1.7–2.4)

## 2021-03-11 MED ORDER — HYDRALAZINE HCL 25 MG PO TABS: 25.0000 mg | ORAL_TABLET | Freq: Three times a day (TID) | ORAL | Status: DC

## 2021-03-11 MED ORDER — SODIUM CHLORIDE 0.9 % IV SOLN: INTRAVENOUS | Status: DC

## 2021-03-11 MED ORDER — FLUOXETINE HCL 20 MG PO CAPS
20.0000 mg | ORAL_CAPSULE | Freq: Every day | ORAL | Status: DC
  Administered 2021-03-11 – 2021-03-12 (×2): 20 mg via ORAL
  Filled 2021-03-11 (×2): qty 1

## 2021-03-11 MED ORDER — POTASSIUM CHLORIDE CRYS ER 20 MEQ PO TBCR
40.0000 meq | EXTENDED_RELEASE_TABLET | Freq: Three times a day (TID) | ORAL | Status: AC
  Administered 2021-03-11 (×2): 40 meq via ORAL
  Filled 2021-03-11 (×2): qty 2

## 2021-03-11 MED ORDER — ACETAMINOPHEN 650 MG RE SUPP: 650.0000 mg | Freq: Four times a day (QID) | RECTAL | Status: DC | PRN

## 2021-03-11 MED ORDER — ENOXAPARIN SODIUM 40 MG/0.4ML IJ SOSY
40.0000 mg | PREFILLED_SYRINGE | INTRAMUSCULAR | Status: DC
  Administered 2021-03-11 – 2021-03-12 (×2): 40 mg via SUBCUTANEOUS
  Filled 2021-03-11 (×2): qty 0.4

## 2021-03-11 MED ORDER — ONDANSETRON HCL 4 MG/2ML IJ SOLN: 4.0000 mg | Freq: Four times a day (QID) | INTRAMUSCULAR | Status: DC | PRN

## 2021-03-11 MED ORDER — HYDRALAZINE HCL 25 MG PO TABS
25.0000 mg | ORAL_TABLET | Freq: Three times a day (TID) | ORAL | Status: DC
  Administered 2021-03-11 – 2021-03-12 (×4): 25 mg via ORAL
  Filled 2021-03-11 (×4): qty 1

## 2021-03-11 MED ORDER — ONDANSETRON HCL 4 MG PO TABS: 4.0000 mg | ORAL_TABLET | Freq: Four times a day (QID) | ORAL | Status: DC | PRN

## 2021-03-11 MED ORDER — ROSUVASTATIN CALCIUM 10 MG PO TABS
10.0000 mg | ORAL_TABLET | Freq: Every day | ORAL | Status: DC
  Administered 2021-03-11: 10 mg via ORAL
  Filled 2021-03-11: qty 1

## 2021-03-11 MED ORDER — TEMAZEPAM 15 MG PO CAPS
30.0000 mg | ORAL_CAPSULE | Freq: Every evening | ORAL | Status: DC | PRN
  Administered 2021-03-11: 30 mg via ORAL
  Filled 2021-03-11: qty 2

## 2021-03-11 MED ORDER — ACETAMINOPHEN 325 MG PO TABS: 650.0000 mg | ORAL_TABLET | Freq: Four times a day (QID) | ORAL | Status: DC | PRN

## 2021-03-11 NOTE — Consult Note (Signed)
[facility] Face-to-Face Psychiatry Consult   Reason for Consult: '' overdose on multiple meds. ?? unintentional'' Referring Physician:  Ramiro Harvest, MD Patient Identification: Nicholas Mckenzie MRN:  [id number] Principal Diagnosis: Multiple drug overdose, accidental or unintentional, initial encounter Diagnosis:  Principal Problem:   Multiple drug overdose, accidental or unintentional, initial encounter Active Problems:   Sinus bradycardia   CKD (chronic kidney disease) stage 3a   Hypertension   Sleep apnea   Major depressive disorder, recurrent episode, in partial remission (HCC)   Total Time spent with patient: 1 hour  Subjective:   Nicholas Mckenzie is a 75 y.o. male patient admitted with unintentional medication overdose.  HPI:  75 y.o. male who reports medical history significant of hypertension, lithiasis, sleep apnea, alcohol use -mild in remission and Major depression diagnosed about 40 years ago (stable on Prozac 40 mg daily). Patient reports that he always put all the medications he takes on daily basis in one bottle and take them at once. However, he did same yesterday and made a mistake of taking a bottle of alcohol to '' wash it down'' following which he became lethargic and was brought to the hospital. His wife whom he allowed to partake in the interview confirmed his history. Patient reports that it was an unintentional overdose and has no reason to take his life. 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 Today, he is alert, awake, oriented x 4 denies depression, anxiety, psychosis,delusions and self harming thoughts. He lives with his wife of 10 year and receives all his medications from his PCP.  Past Psychiatric History: as above  Risk to Self:  denies Risk to Others:  denies Prior Inpatient Therapy:  none reported by the patient Prior Outpatient Therapy:  PCP  Past Medical History:  Past Medical History:  Diagnosis Date   Hypertension     Kidney stones    Sleep apnea     Past Surgical History:  Procedure Laterality Date   CATARACT EXTRACTION     kidney stent     MANDIBLE FRACTURE SURGERY     Family History:  Family History  Problem Relation Age of Onset   Hypertension Mother    Hypertension Other    Family Psychiatric  History:  Social History:  Social History   Substance and Sexual Activity  Alcohol Use None     Social History   Substance and Sexual Activity  Drug Use Not on file    Social History   Socioeconomic History   Marital status: Married    Spouse name: Not on file   Number of children: Not on file   Years of education: Not on file   Highest education level: Not on file  Occupational History   Not on file  Tobacco Use   Smoking status: Not on file   Smokeless tobacco: Not on file  Substance and Sexual Activity   Alcohol use: Not on file   Drug use: Not on file   Sexual activity: Not on file  Other Topics Concern   Not on file  Social History Narrative   Not on file   Social Determinants of Health   Financial Resource Strain: Not on file  Food Insecurity: Not on file  Transportation Needs: Not on file  Physical Activity: Not on file  Stress: Not on file  Social Connections: Not on file   Additional Social History:    Allergies:  No Known Allergies  Labs:  Results for orders  placed or performed during the hospital encounter of 03/10/21 (from the past 48 hour(s))  Comprehensive metabolic panel     Status: Abnormal   Collection Time: 03/10/21  5:11 PM  Result Value Ref Range   Sodium 139 135 - 145 mmol/L   Potassium 3.7 3.5 - 5.1 mmol/L   Chloride 105 98 - 111 mmol/L   CO2 26 22 - 32 mmol/L   Glucose, Bld 96 70 - 99 mg/dL    Comment: Glucose reference range applies only to samples taken after fasting for at least 8 hours.   BUN 26 (H) 8 - 23 mg/dL   Creatinine, Ser 3.66 (H) 0.61 - 1.24 mg/dL   Calcium 8.9 8.9 - 44.0 mg/dL   Total Protein 6.5 6.5 - 8.1 g/dL   Albumin  3.8 3.5 - 5.0 g/dL   AST 22 15 - 41 U/L   ALT 23 0 - 44 U/L   Alkaline Phosphatase 75 38 - 126 U/L   Total Bilirubin 0.5 0.3 - 1.2 mg/dL   GFR, Estimated 51 (L) >60 mL/min    Comment: (NOTE) Calculated using the CKD-EPI Creatinine Equation (2021)    Anion gap 8 5 - 15    Comment: Performed at [facility], [address], [place], [place] [address]  CBC with Differential     Status: None   Collection Time: 03/10/21  5:11 PM  Result Value Ref Range   WBC 6.9 4.0 - 10.5 K/uL   RBC 4.53 4.22 - 5.81 MIL/uL   Hemoglobin 13.2 13.0 - 17.0 g/dL   HCT 59.5 63.8 - 75.6 %   MCV 90.5 80.0 - 100.0 fL   MCH 29.1 26.0 - 34.0 pg   MCHC 32.2 30.0 - 36.0 g/dL   RDW 43.3 29.5 - 18.8 %   Platelets 185 150 - 400 K/uL   nRBC 0.0 0.0 - 0.2 %   Neutrophils Relative % 68 %   Neutro Abs 4.7 1.7 - 7.7 K/uL   Lymphocytes Relative 20 %   Lymphs Abs 1.3 0.7 - 4.0 K/uL   Monocytes Relative 8 %   Monocytes Absolute 0.6 0.1 - 1.0 K/uL   Eosinophils Relative 3 %   Eosinophils Absolute 0.2 0.0 - 0.5 K/uL   Basophils Relative 0 %   Basophils Absolute 0.0 0.0 - 0.1 K/uL   Immature Granulocytes 1 %   Abs Immature Granulocytes 0.04 0.00 - 0.07 K/uL    Comment: Performed at [facility], [address], [place], [place] [address]  Acetaminophen level     Status: Abnormal   Collection Time: 03/10/21  5:11 PM  Result Value Ref Range   Acetaminophen (Tylenol), Serum <10 (L) 10 - 30 ug/mL    Comment: (NOTE) Therapeutic concentrations vary significantly. A range of 10-30 ug/mL  may be an effective concentration for many patients. However, some  are best treated at concentrations outside of this range. Acetaminophen concentrations >150 ug/mL at 4 hours after ingestion  and >50 ug/mL at 12 hours after ingestion are often associated with  toxic reactions.  Performed at [facility], [address] [place], [place] [address]   Salicylate level     Status:  Abnormal   Collection Time: 03/10/21  5:11 PM  Result Value Ref Range   Salicylate Lvl <7.0 (L) 7.0 - 30.0 mg/dL    Comment: Performed at [facility], [address], [place], [place] [address]  Ethanol     Status: None  Collection Time: 03/10/21  5:11 PM  Result Value Ref Range   Alcohol, Ethyl (B) <10 <10 mg/dL    Comment: (NOTE) Lowest detectable limit for serum alcohol is 10 mg/dL.  For medical purposes only. Performed at [facility], [address] [place], [place] [address]   Rapid urine drug screen (hospital performed)     Status: Abnormal   Collection Time: 03/10/21  7:27 PM  Result Value Ref Range   Opiates NONE DETECTED NONE DETECTED   Cocaine NONE DETECTED NONE DETECTED   Benzodiazepines POSITIVE (A) NONE DETECTED   Amphetamines NONE DETECTED NONE DETECTED   Tetrahydrocannabinol NONE DETECTED NONE DETECTED   Barbiturates NONE DETECTED NONE DETECTED    Comment: (NOTE) DRUG SCREEN FOR MEDICAL PURPOSES ONLY.  IF CONFIRMATION IS NEEDED FOR ANY PURPOSE, NOTIFY LAB WITHIN 5 DAYS.  LOWEST DETECTABLE LIMITS FOR URINE DRUG SCREEN Drug Class                     Cutoff (ng/mL) Amphetamine and metabolites    1000 Barbiturate and metabolites    200 Benzodiazepine                 200 Tricyclics and metabolites     300 Opiates and metabolites        300 Cocaine and metabolites        300 THC                            50 Performed at [facility], [address] [place], [place] [address]   Basic metabolic panel     Status: Abnormal   Collection Time: 03/11/21  3:41 AM  Result Value Ref Range   Sodium 141 135 - 145 mmol/L   Potassium 3.3 (L) 3.5 - 5.1 mmol/L   Chloride 105 98 - 111 mmol/L   CO2 27 22 - 32 mmol/L   Glucose, Bld 115 (H) 70 - 99 mg/dL    Comment: Glucose reference range applies only to samples taken after fasting for at least 8 hours.   BUN 19 8 - 23 mg/dL   Creatinine, Ser 6.83 0.61 - 1.24  mg/dL   Calcium 9.0 8.9 - 41.9 mg/dL   GFR, Estimated >62 >22 mL/min    Comment: (NOTE) Calculated using the CKD-EPI Creatinine Equation (2021)    Anion gap 9 5 - 15    Comment: Performed at [facility], [address] [place], [place] [address]  Magnesium     Status: None   Collection Time: 03/11/21  3:41 AM  Result Value Ref Range   Magnesium 2.2 1.7 - 2.4 mg/dL    Comment: Performed at [facility], [address], [place], [place] [address]  Resp Panel by RT-PCR (Flu A&B, Covid) Nasopharyngeal Swab     Status: None   Collection Time: 03/11/21  3:48 AM   Specimen: Nasopharyngeal Swab; Nasopharyngeal(NP) swabs in vial transport medium  Result Value Ref Range   SARS Coronavirus 2 by RT PCR NEGATIVE NEGATIVE    Comment: (NOTE) SARS-CoV-2 target nucleic acids are NOT DETECTED.  The SARS-CoV-2 RNA is generally detectable in upper respiratory specimens during the acute phase of infection. The lowest concentration of SARS-CoV-2 viral copies this assay can detect is 138 copies/mL. A negative result does not preclude SARS-Cov-2 infection and should not be used as the sole basis for treatment or other patient management decisions.  A negative result may occur with  improper specimen collection/handling, submission of specimen other than nasopharyngeal swab, presence of viral mutation(s) within the areas targeted by this assay, and inadequate number of viral copies(<138 copies/mL). A negative result must be combined with clinical observations, patient history, and epidemiological information. The expected result is Negative.  Fact Sheet for Patients:  BloggerCourse.com  Fact Sheet for Healthcare Providers:  SeriousBroker.it  This test is no t yet approved or cleared by the [place] FDA and  has been authorized for detection and/or diagnosis of SARS-CoV-2 by FDA under an Emergency Use  Authorization (EUA). This EUA will remain  in effect (meaning this test can be used) for the duration of the COVID-19 declaration under Section 564(b)(1) of the Act, 21 U.S.C.section 360bbb-3(b)(1), unless the authorization is terminated  or revoked sooner.       Influenza A by PCR NEGATIVE NEGATIVE   Influenza B by PCR NEGATIVE NEGATIVE    Comment: (NOTE) The Xpert Xpress SARS-CoV-2/FLU/RSV plus assay is intended as an aid in the diagnosis of influenza from Nasopharyngeal swab specimens and should not be used as a sole basis for treatment. Nasal washings and aspirates are unacceptable for Xpert Xpress SARS-CoV-2/FLU/RSV testing.  Fact Sheet for Patients: BloggerCourse.com  Fact Sheet for Healthcare Providers: SeriousBroker.it  This test is not yet approved or cleared by the [place] FDA and has been authorized for detection and/or diagnosis of SARS-CoV-2 by FDA under an Emergency Use Authorization (EUA). This EUA will remain in effect (meaning this test can be used) for the duration of the COVID-19 declaration under Section 564(b)(1) of the Act, 21 U.S.C. section 360bbb-3(b)(1), unless the authorization is terminated or revoked.  Performed at [facility], [address] [place], [place] [address]     Current Facility-Administered Medications  Medication Dose Route Frequency Provider Last Rate Last Admin   0.9 %  sodium chloride infusion   Intravenous Continuous Bobette Mo, MD 100 mL/hr at 03/11/21 0934 New Bag at 03/11/21 0934   acetaminophen (TYLENOL) tablet 650 mg  650 mg Oral Q6H PRN Bobette Mo, MD       Or   acetaminophen (TYLENOL) suppository 650 mg  650 mg Rectal Q6H PRN Bobette Mo, MD       enoxaparin (LOVENOX) injection 40 mg  40 mg Subcutaneous Q24H Bobette Mo, MD   40 mg at 03/11/21 0934   hydrALAZINE (APRESOLINE) tablet 25 mg  25 mg Oral Q8H Rodolph Bong, MD   25 mg at 03/11/21 1114   ondansetron (ZOFRAN) tablet 4 mg  4 mg Oral Q6H PRN Bobette Mo, MD       Or   ondansetron [facility]) injection 4 mg  4 mg Intravenous Q6H PRN Bobette Mo, MD       potassium chloride SA (KLOR-CON) CR tablet 40 mEq  40 mEq Oral Q8H Bobette Mo, MD   40 mEq at 03/11/21 0539    Musculoskeletal: Strength & Muscle Tone: within normal limits Gait & Station: normal Patient leans: N/A   Psychiatric Specialty Exam:  Presentation  General Appearance: Appropriate for Environment; Well Groomed  Eye Contact:Good  Speech:Clear and Coherent; Normal Rate  Speech Volume:Normal  Handedness:Right   Mood and Affect  Mood:Euthymic  Affect:Appropriate   Thought Process  Thought Processes:Coherent; Linear  Descriptions of Associations:Intact  Orientation:Full (Time, Place and Person)  Thought Content:Abstract Reasoning; Logical  History of Schizophrenia/Schizoaffective disorder:No data recorded Duration of Psychotic Symptoms:No  data recorded Hallucinations:Hallucinations: None  Ideas of Reference:None  Suicidal Thoughts:Suicidal Thoughts: No  Homicidal Thoughts:Homicidal Thoughts: No   Sensorium  Memory:Immediate Good; Recent Good; Remote Good  Judgment:Intact  Insight:Good   Executive Functions  Concentration:Good  Attention Span:Good  Recall:Good  Fund of Knowledge:Good  Language:Good   Psychomotor Activity  Psychomotor Activity:Psychomotor Activity: Normal   Assets  Assets:Communication Skills   Sleep  Sleep:Sleep: Fair (uses CPAP due to sleep apnea)   Physical Exam: Physical Exam ROS Blood pressure (!) 174/87, pulse (!) 48, temperature 98.4 F (36.9 C), temperature source Oral, resp. rate 18, height 5\' 8"  (1.727 m), weight 98.5 kg, SpO2 94 %. Body mass index is 33.02 kg/m.  Treatment Plan Summary: 75 year old male with 40 year history of depression but stable on Prozac 40 mg daily.  He was admitted to the hospital after he became lethargic following unintentional overdose on his medication and a bottle of beer. However, patient is now alert, awake, oriented and vehemently denies suicide attempt. He appears mentally stable, denies psychosis and delusions.  Recommendations: -Re-start patient on Prozac 20 mg daily when he is able to tolerate orally. -Refer patient to his PCP for medication management upon discharge   Disposition: No evidence of imminent risk to self or others at present.   Patient does not meet criteria for psychiatric inpatient admission. Supportive therapy provided about ongoing stressors. Psychiatric service signing out. Re-consult as needed  66, MD 03/11/2021 12:24 PM

## 2021-03-11 NOTE — H&P (Signed)
History and Physical    Kentrail Shew NWG:[id number] DOB: [birth year] DOA: 03/10/2021  PCP: Eartha Inch, MD  Patient coming from: Home.  I have personally briefly reviewed patient's old medical records in [facility]  Chief Complaint: Drug overdose  HPI: Nicholas Mckenzie is a 75 y.o. male with medical history significant of hypertension, lithiasis, sleep apnea who is coming to the emergency department via EMS who took an unknown amount of temazepam, metoprolol succinate and probably amlodipine fluoxetine and valsartan.  His wife stated that his behavior have been normal off to the point.  He is still somnolent and unable to remember all the details.  He stated he was not trying to harm himself.  Denied headache, dyspnea, back, chest or abdominal pain.  No fever, chills, rhinorrhea, sore throat, dyspnea, wheezing or hemoptysis.  No chest pain, palpitations, diaphoresis, PND, orthopnea or pitting edema of the lower extremities.  Denied nausea, emesis, diarrhea, constipation, melena or hematochezia.  No dysuria, frequency or hematuria.  No polyuria, polydipsia, polyphagia or blurred vision.  ED Course: Initial vital signs were temperature 98.4 F, pulse 53, respirations 17, BP 139/79 mmHg O2 sat 98% on room air.  The patient received a 1000 mL LR bolus.  Lab work: UDS was positive for benzodiazepines.  CBC was normal.  CMP showed a BUN of 26 and creatinine of 1.45 mg/dL.  The rest of the values were normal.  Normal EtOH, salicylate and acetaminophen levels.  Review of Systems: As per HPI otherwise all other systems reviewed and are negative.  Past Medical History:  Diagnosis Date   Hypertension    Kidney stones    Sleep apnea     Past Surgical History:  Procedure Laterality Date   CATARACT EXTRACTION     kidney stent     MANDIBLE FRACTURE SURGERY      Social History  has no history on file for tobacco use, alcohol use, and drug use.  No Known Allergies  Family medical  history Several members with history of hypertension.  Prior to Admission medications   Medication Sig Start Date End Date Taking? Authorizing Provider  cephALEXin (KEFLEX) 500 MG capsule Take 1 capsule (500 mg total) by mouth 2 (two) times daily. 02/10/21   Rolan Bucco, MD  HYDROcodone-acetaminophen (NORCO/VICODIN) 5-325 MG tablet Take 1 tablet by mouth every 4 (four) hours as needed. 02/10/21   Rolan Bucco, MD   Physical Exam: Vitals:   03/10/21 2328 03/10/21 2358 03/11/21 0028 03/11/21 0100  BP: (!) 178/86 (!) 154/84 140/78 130/73  Pulse: (!) 50 (!) 44 (!) 44 (!) 45  Resp: (!) 22 19 19 18   Temp:      TempSrc:      SpO2: 98% 97% 97% 92%   Constitutional: NAD, calm, comfortable Eyes: PERRL, lids and conjunctivae normal ENMT: Mucous membranes are moist. Posterior pharynx clear of any exudate or lesions. Neck: normal, supple, no masses, no thyromegaly Respiratory: clear to auscultation bilaterally, no wheezing, no crackles. Normal respiratory effort. No accessory muscle use.  Cardiovascular: Sinus bradycardia in the 40s and 50s, no murmurs / rubs / gallops. No extremity edema. 2+ pedal pulses. No carotid bruits.  Abdomen: Obese, no distention.  Bowel sounds positive.  Soft, no tenderness, no masses palpated. No hepatosplenomegaly Musculoskeletal: Mild generalized weakness.  No clubbing / cyanosis. Good ROM, no contractures. Normal muscle tone.  Skin: no rashes, lesions, ulcers on very limited dermatological examination. Neurologic: CN 2-12 grossly intact. Sensation intact, DTR normal. Strength 5/5 in all  4.  Psychiatric: Somnolent but wakes up with tactile or verbal stimuli.  Oriented x3 afterwards  Labs on Admission: I have personally reviewed following labs and imaging studies  CBC: Recent Labs  Lab 03/10/21 1711  WBC 6.9  NEUTROABS 4.7  HGB 13.2  HCT 41.0  MCV 90.5  PLT 185    Basic Metabolic Panel: Recent Labs  Lab 03/10/21 1711  NA 139  K 3.7  CL 105  CO2 26   GLUCOSE 96  BUN 26*  CREATININE 1.45*  CALCIUM 8.9    GFR: CrCl cannot be calculated (Unknown ideal weight.).  Liver Function Tests: Recent Labs  Lab 03/10/21 1711  AST 22  ALT 23  ALKPHOS 75  BILITOT 0.5  PROT 6.5  ALBUMIN 3.8   Radiological Exams on Admission: No results found.  EKG: Independently reviewed.  Vent. rate 53 BPM PR interval 197 ms QRS duration 114 ms QT/QTcB 446/419 ms P-R-T axes 13 -13 3 Sinus rhythm Incomplete left bundle branch block Inferior infarct, old Baseline wander in lead(s) I III aVL  Assessment/Plan Principal Problem:   Multiple drug overdose, accidental or unintentional, initial encounter Observation/progressive unit. Neurochecks every 4 hours. Consider psych evaluation once more alert.  Active Problems:   Sinus bradycardia Secondary to possible metoprolol OD. Monitor blood pressure and heart rate. Atropine as needed for sustained bradycardia less than 40 bpm.    CKD (chronic kidney disease) stage 3a Monitor renal function electrolytes.    Hypertension Hold antihypertensives. Monitor blood pressure.    Sleep apnea Continue CPAP at bedtime. Home device seem to be malfunctioning. He requested his PCP and device provider for replacement.   DVT prophylaxis: Lovenox SQ. Code Status:   Full code. Family Communication:   Disposition Plan:   Patient is from:  Home.  Anticipated DC to:  Home.  Anticipated DC date:  03/12/2021.  Anticipated DC barriers: Clinical status.  Consults called:   Admission status:  Observation/progressive unit.  Severity of Illness:  High severity after presenting with a history of multiple pharmaceutical unintentional overdose that included temazepam, metoprolol succinate, but also could have to had amlodipine fluoxetine or valsartan.  The patient will need to remain under close observation for 24 to 48 hours.  Bobette Mo MD [facility]  How to contact the [facility] Attending or  Consulting provider 7A - 7P or covering provider during after hours 7P -7A, for this patient?   Check the care team in [facility] and look for a) attending/consulting [facility] provider listed and b) the [facility] team listed Log into [email or web address] and use [facility]'s universal password to access. If you do not have the password, please contact the hospital operator. Locate the [facility] provider you are looking for under [facility] and page to a number that you can be directly reached. If you still have difficulty reaching the provider, please page the [facility] (Director on Call) for the Hospitalists listed on amion for assistance.  03/11/2021, 2:25 AM   This document was prepared using [employer] adviser and may contain some unintended transcription errors.

## 2021-03-11 NOTE — Progress Notes (Signed)
I have seen and assessed patient and agree with Dr. Robb Matar assessment and plan. Patient is a 75 year old gentleman with history of hypertension, sleep apnea presented to the ED via EMS after he took an unknown amount of temazepam, Metroprolol, probable amlodipine, fluoxetine and valsartan and noted by his wife to have some abnormal behavior with some somnolence and memory difficulties.  Patient denies suicidal ideation.  Patient seen in the ED noted to be bradycardic with heart rates in the 40s to 50s, generalized weakness.  Patient admitted for multiple drug overdose, unintentional to be monitored in the progressive care unit with neurochecks.  Psychiatry consulted.  No charge.

## 2021-03-12 ENCOUNTER — Encounter (HOSPITAL_COMMUNITY): Payer: Self-pay

## 2021-03-12 ENCOUNTER — Other Ambulatory Visit: Payer: Self-pay

## 2021-03-12 DIAGNOSIS — N1831 Chronic kidney disease, stage 3a: Secondary | ICD-10-CM | POA: Diagnosis not present

## 2021-03-12 DIAGNOSIS — I10 Essential (primary) hypertension: Secondary | ICD-10-CM | POA: Diagnosis not present

## 2021-03-12 DIAGNOSIS — T50911A Poisoning by multiple unspecified drugs, medicaments and biological substances, accidental (unintentional), initial encounter: Secondary | ICD-10-CM | POA: Diagnosis not present

## 2021-03-12 DIAGNOSIS — F3341 Major depressive disorder, recurrent, in partial remission: Secondary | ICD-10-CM | POA: Diagnosis not present

## 2021-03-12 LAB — BASIC METABOLIC PANEL
Anion gap: 8 (ref 5–15)
BUN: 23 mg/dL (ref 8–23)
CO2: 26 mmol/L (ref 22–32)
Calcium: 8.8 mg/dL — ABNORMAL LOW (ref 8.9–10.3)
Chloride: 106 mmol/L (ref 98–111)
Creatinine, Ser: 1.27 mg/dL — ABNORMAL HIGH (ref 0.61–1.24)
GFR, Estimated: 60 mL/min — ABNORMAL LOW (ref >60)
Glucose, Bld: 85 mg/dL (ref 70–99)
Potassium: 4.3 mmol/L (ref 3.5–5.1)
Sodium: 140 mmol/L (ref 135–145)

## 2021-03-12 LAB — MAGNESIUM: Magnesium: 2.3 mg/dL (ref 1.7–2.4)

## 2021-03-12 MED ORDER — AMOXICILLIN-POT CLAVULANATE 875-125 MG PO TABS: 1.0000 | ORAL_TABLET | Freq: Two times a day (BID) | ORAL | Status: DC

## 2021-03-12 MED ORDER — CHLORHEXIDINE GLUCONATE 0.12 % MT SOLN
15.0000 mL | Freq: Two times a day (BID) | OROMUCOSAL | Status: DC
  Administered 2021-03-12: 15 mL via OROMUCOSAL
  Filled 2021-03-12: qty 15

## 2021-03-12 MED ORDER — HYDRALAZINE HCL 25 MG PO TABS
25.0000 mg | ORAL_TABLET | Freq: Three times a day (TID) | ORAL | 0 refills | Status: DC
Start: 2021-03-12 — End: 2021-04-14

## 2021-03-12 MED ORDER — INFLUENZA VAC A&B SA ADJ QUAD 0.5 ML IM PRSY: 0.5000 mL | PREFILLED_SYRINGE | INTRAMUSCULAR | Status: DC

## 2021-03-12 MED ORDER — ACETAMINOPHEN 325 MG PO TABS: 650.0000 mg | ORAL_TABLET | Freq: Four times a day (QID) | ORAL | Status: DC | PRN

## 2021-03-12 MED ORDER — AMOXICILLIN-POT CLAVULANATE 875-125 MG PO TABS
1.0000 | ORAL_TABLET | Freq: Two times a day (BID) | ORAL | Status: DC
  Administered 2021-03-12: 1 via ORAL
  Filled 2021-03-12: qty 1

## 2021-03-12 MED ORDER — IRBESARTAN 300 MG PO TABS
300.0000 mg | ORAL_TABLET | Freq: Every day | ORAL | Status: DC
  Administered 2021-03-12: 300 mg via ORAL
  Filled 2021-03-12: qty 1

## 2021-03-12 MED ORDER — ORAL CARE MOUTH RINSE
15.0000 mL | Freq: Two times a day (BID) | OROMUCOSAL | Status: DC
  Administered 2021-03-12: 15 mL via OROMUCOSAL

## 2021-03-12 MED ORDER — AMLODIPINE BESYLATE 10 MG PO TABS
10.0000 mg | ORAL_TABLET | Freq: Every day | ORAL | Status: DC
  Administered 2021-03-12: 10 mg via ORAL
  Filled 2021-03-12: qty 1

## 2021-03-12 MED ORDER — AMLODIPINE BESYLATE 10 MG PO TABS: 10.0000 mg | ORAL_TABLET | Freq: Every day | ORAL | Status: DC

## 2021-03-12 NOTE — Progress Notes (Signed)
Poison Control called to check on patient. Poison Control signed off on patient.

## 2021-03-12 NOTE — Discharge Summary (Signed)
Physician Discharge Summary  Adien Kimmel QQV:[id number] DOB: 08-23-1945 DOA: 03/10/2021  PCP: Eartha Inch, MD  Admit date: 03/10/2021 Discharge date: 03/12/2021  Time spent: 50 minutes  Recommendations for Outpatient Follow-up:  Follow-up with Eartha Inch, MD in 1 week or as previously scheduled.  On follow-up patient's blood pressure need to be reassessed as patient's metoprolol has been discontinued and patient placed on hydralazine, continued on home regimen Norvasc and ARB.  Patient will need a basic metabolic profile done to follow-up on electrolytes and renal function.   Discharge Diagnoses:  Principal Problem:   Multiple drug overdose, accidental or unintentional, initial encounter Active Problems:   Sinus bradycardia   CKD (chronic kidney disease) stage 3a   Hypertension   Sleep apnea   Major depressive disorder, recurrent episode, in partial remission (HCC)   Discharge Condition: Stable and improved  Diet recommendation: Heart healthy  Filed Weights   03/11/21 0900  Weight: 98.5 kg    History of present illness:  HPI per Dr. Lucien Mons Baze is a 75 y.o. male with medical history significant of hypertension, lithiasis, sleep apnea who is coming to the emergency department via EMS who took an unknown amount of temazepam, metoprolol succinate and probably amlodipine fluoxetine and valsartan.  His wife stated that his behavior have been normal off to the point.  He is still somnolent and unable to remember all the details.  He stated he was not trying to harm himself.  Denied headache, dyspnea, back, chest or abdominal pain.  No fever, chills, rhinorrhea, sore throat, dyspnea, wheezing or hemoptysis.  No chest pain, palpitations, diaphoresis, PND, orthopnea or pitting edema of the lower extremities.  Denied nausea, emesis, diarrhea, constipation, melena or hematochezia.  No dysuria, frequency or hematuria.  No polyuria, polydipsia, polyphagia or blurred vision.   ED  Course: Initial vital signs were temperature 98.4 F, pulse 53, respirations 17, BP 139/79 mmHg O2 sat 98% on room air.  The patient received a 1000 mL LR bolus.   Lab work: UDS was positive for benzodiazepines.  CBC was normal.  CMP showed a BUN of 26 and creatinine of 1.45 mg/dL.  The rest of the values were normal.  Normal EtOH, salicylate and acetaminophen levels.   Hospital Course:  #1 multiple drug overdose, accidental or unintentional -Patient was admitted with unintentional drug overdose it was felt patient had taken an unknown amount of temazepam, metoprolol succinate, amlodipine, fluoxetine, valsartan and noted to have change in behavior. -Patient was admitted and observed in the progressive care unit with neurochecks every 4 hours. -Patient noted to be bradycardic on admission with heart rates in the 40s to the 50s. -Patient gently hydrated with IV fluids and antihypertensive medications initially held. -UDS obtained was positive for benzodiazepines. -Alcohol level was < 10, salicylate level < 7. -Patient improved clinically and was back to baseline by day of discharge. -Patient also seen by psychiatry who recommended resumption of patient's Prozac, felt patient was not an imminent risk to self or others and did not meet criteria for psychiatric inpatient admission. -Outpatient follow-up recommended with PCP. -Patient be discharged in stable and improved condition.  2.  Sinus bradycardia -Patient noted to have heart rates in the 40s to 50s on admission felt likely secondary to overdose from metoprolol. -Metoprolol was discontinued during this hospitalization. -Bradycardia improved such that by day of discharge heart rate was in the 60s. -Metoprolol will not be resumed on discharge, outpatient follow-up with PCP.  3.  Chronic  kidney disease stage IIIa -Remained stable. -ARB initially held and resumed on discharge.  4.  Hypertension -Patient's antihypertensives were initially  held on admission. -As patient improved clinically patient was started on hydralazine for better blood pressure control, patient's ARB was subsequently resumed.  It was noted that patient is Norvasc was recently increased to 10 mg daily per PCPs last office note which patient was placed back on. -Patient's metoprolol was discontinued during the hospitalization. -Outpatient follow-up with PCP for further blood pressure titration and management.  5.  Sleep apnea -Patient noted to to have his home device which was malfunctioning and requested being placed in for PCP and device provider for replacement which was pending. -Patient maintained on CPAP during the hospitalization. -Outpatient follow-up.  6.  Major depressive disorder -Remained stable throughout the hospitalization. -Patient seen in consultation by psychiatry who recommended resumption of patient's Prozac with outpatient follow-up with PCP.  Procedures: None  Consultations: Psychiatry: Dr. Jannifer Franklin 03/11/2021  Discharge Exam: Vitals:   03/12/21 0950 03/12/21 1254  BP: (!) 174/96 (!) 153/91  Pulse:  (!) 59  Resp:  18  Temp:  98.5 F (36.9 C)  SpO2:  97%    General: NAD Cardiovascular: RRR no murmurs rubs or gallops.  No JVD.  No lower extremity edema. Respiratory: Clear to auscultation bilaterally.  No wheezes, no crackles, no rhonchi.  Discharge Instructions   Discharge Instructions     Diet - low sodium heart healthy   Complete by: As directed    Increase activity slowly   Complete by: As directed       Allergies as of 03/12/2021   No Known Allergies      Medication List     STOP taking these medications    cephALEXin 500 MG capsule Commonly known as: KEFLEX   HYDROcodone-acetaminophen 5-325 MG tablet Commonly known as: NORCO/VICODIN   metoprolol succinate 25 MG 24 hr tablet Commonly known as: TOPROL-XL       TAKE these medications    acetaminophen 325 MG tablet Commonly known as:  TYLENOL Take 2 tablets (650 mg total) by mouth every 6 (six) hours as needed for mild pain (or Fever >/= 101).   amLODipine 10 MG tablet Commonly known as: NORVASC Take 1 tablet (10 mg total) by mouth daily. What changed:  medication strength how much to take   amoxicillin-clavulanate 875-125 MG tablet Commonly known as: AUGMENTIN Take 1 tablet by mouth every 12 (twelve) hours.   FLUoxetine 20 MG capsule Commonly known as: PROZAC Take 1 capsule by mouth daily.   hydrALAZINE 25 MG tablet Commonly known as: APRESOLINE Take 1 tablet (25 mg total) by mouth every 8 (eight) hours.   Hydrocortisone (Perianal) 1 % Crea Place 1 application rectally daily as needed for pain.   MULTIVITAMIN ADULT PO Take 1 tablet by mouth daily.   OVER THE COUNTER MEDICATION Take 1 tablet by mouth daily. Magnesium + Potassium + Bromelain supplement by [employer]   OVER THE COUNTER MEDICATION Take 1 tablet by mouth at bedtime. Dan Maker for sleep.   rosuvastatin 10 MG tablet Commonly known as: CRESTOR Take 10 mg by mouth at bedtime.   sildenafil 20 MG tablet Commonly known as: REVATIO Take 4 tablets by mouth daily as needed (ED).   telmisartan 80 MG tablet Commonly known as: MICARDIS Take 1 tablet by mouth daily.   temazepam 30 MG capsule Commonly known as: RESTORIL Take 30 mg by mouth at bedtime as needed for sleep.  No Known Allergies  Follow-up Information     Eartha Inch, MD. Schedule an appointment as soon as possible for a visit in 1 week(s).   Specialty: Family Medicine Why: Follow-up in 1 week or as previously scheduled. Contact information: [address] [place] [address] [phone]                  The results of significant diagnostics from this hospitalization (including imaging, microbiology, ancillary and laboratory) are listed below for reference.    Significant Diagnostic Studies: CT Renal Stone Study  Result Date:  02/10/2021 CLINICAL DATA:  Bilateral flank pain.  Hematuria. EXAM: CT ABDOMEN AND PELVIS WITHOUT CONTRAST TECHNIQUE: Multidetector CT imaging of the abdomen and pelvis was performed following the standard protocol without IV contrast. COMPARISON:  None. FINDINGS: Lower chest: No acute findings. Hepatobiliary: No mass visualized on this unenhanced exam. Gallbladder is unremarkable. No evidence of biliary ductal dilatation. Pancreas: No mass or inflammatory process visualized on this unenhanced exam. Spleen:  Within normal limits in size. Adrenals/Urinary tract: A few tiny fluid attenuation and hemorrhagic renal cysts are noted. Severe right hydroureteronephrosis is seen with diffuse right renal parenchymal atrophy. A right ureteral stent is seen in appropriate position. Several calculi are seen adjacent to the stent in the mid right ureter measuring up to 6 mm. An 8 mm nonobstructing calculus is seen in the right renal collecting system. A 2.8 cm calculus is seen in the urinary bladder, which has formed around the distal pigtail loop of the ureteral stent. Stomach/Bowel: No evidence of obstruction, inflammatory process, or abnormal fluid collections. Diverticulosis is seen mainly involving the descending and sigmoid colon, however there is no evidence of diverticulitis. Vascular/Lymphatic: No pathologically enlarged lymph nodes identified. No evidence of abdominal aortic aneurysm. Aortic atherosclerotic calcification noted. Reproductive:  No mass or other significant abnormality. Other:  None. Musculoskeletal:  No suspicious bone lesions identified. IMPRESSION: Severe right hydroureteronephrosis with diffuse right renal parenchymal atrophy. Right ureteral stent in appropriate position, with several calculi in the mid right ureter measuring up to 6 mm. Large urinary bladder calculus which is formed around the distal pigtail loop of the ureteral stent. Stent malfunction cannot be excluded. Colonic diverticulosis,  without radiographic evidence of diverticulitis. Aortic Atherosclerosis (ICD10-I70.0). Electronically Signed   By: Danae Orleans M.D.   On: 02/10/2021 17:12    Microbiology: Recent Results (from the past 240 hour(s))  Resp Panel by RT-PCR (Flu A&B, Covid) Nasopharyngeal Swab     Status: None   Collection Time: 03/11/21  3:48 AM   Specimen: Nasopharyngeal Swab; Nasopharyngeal(NP) swabs in vial transport medium  Result Value Ref Range Status   SARS Coronavirus 2 by RT PCR NEGATIVE NEGATIVE Final    Comment: (NOTE) SARS-CoV-2 target nucleic acids are NOT DETECTED.  The SARS-CoV-2 RNA is generally detectable in upper respiratory specimens during the acute phase of infection. The lowest concentration of SARS-CoV-2 viral copies this assay can detect is 138 copies/mL. A negative result does not preclude SARS-Cov-2 infection and should not be used as the sole basis for treatment or other patient management decisions. A negative result may occur with  improper specimen collection/handling, submission of specimen other than nasopharyngeal swab, presence of viral mutation(s) within the areas targeted by this assay, and inadequate number of viral copies(<138 copies/mL). A negative result must be combined with clinical observations, patient history, and epidemiological information. The expected result is Negative.  Fact Sheet for Patients:  BloggerCourse.com  Fact Sheet for Healthcare Providers:  SeriousBroker.it  This test is no t yet approved or cleared by the [place] and  has been authorized for detection and/or diagnosis of SARS-CoV-2 by FDA under an Emergency Use Authorization (EUA). This EUA will remain  in effect (meaning this test can be used) for the duration of the COVID-19 declaration under Section 564(b)(1) of the Act, 21 U.S.C.section 360bbb-3(b)(1), unless the authorization is terminated  or revoked sooner.        Influenza A by PCR NEGATIVE NEGATIVE Final   Influenza B by PCR NEGATIVE NEGATIVE Final    Comment: (NOTE) The Xpert Xpress SARS-CoV-2/FLU/RSV plus assay is intended as an aid in the diagnosis of influenza from Nasopharyngeal swab specimens and should not be used as a sole basis for treatment. Nasal washings and aspirates are unacceptable for Xpert Xpress SARS-CoV-2/FLU/RSV testing.  Fact Sheet for Patients: BloggerCourse.com  Fact Sheet for Healthcare Providers: SeriousBroker.it  This test is not yet approved or cleared by the [place] FDA and has been authorized for detection and/or diagnosis of SARS-CoV-2 by FDA under an Emergency Use Authorization (EUA). This EUA will remain in effect (meaning this test can be used) for the duration of the COVID-19 declaration under Section 564(b)(1) of the Act, 21 U.S.C. section 360bbb-3(b)(1), unless the authorization is terminated or revoked.  Performed at [facility], [address], [place], [place] [address]      Labs: Basic Metabolic Panel: Recent Labs  Lab 03/10/21 1711 03/11/21 0341 03/12/21 0501  NA 139 141 140  K 3.7 3.3* 4.3  CL 105 105 106  CO2 26 27 26   GLUCOSE 96 115* 85  BUN 26* 19 23  CREATININE 1.45* 1.12 1.27*  CALCIUM 8.9 9.0 8.8*  MG  --  2.2 2.3   Liver Function Tests: Recent Labs  Lab 03/10/21 1711  AST 22  ALT 23  ALKPHOS 75  BILITOT 0.5  PROT 6.5  ALBUMIN 3.8   No results for input(s): LIPASE, AMYLASE in the last 168 hours. No results for input(s): AMMONIA in the last 168 hours. CBC: Recent Labs  Lab 03/10/21 1711  WBC 6.9  NEUTROABS 4.7  HGB 13.2  HCT 41.0  MCV 90.5  PLT 185   Cardiac Enzymes: No results for input(s): CKTOTAL, CKMB, CKMBINDEX, TROPONINI in the last 168 hours. BNP: BNP (last 3 results) No results for input(s): BNP in the last 8760 hours.  ProBNP (last 3 results) No results for input(s):  PROBNP in the last 8760 hours.  CBG: No results for input(s): GLUCAP in the last 168 hours.     Signed:  03/12/21 MD.  [facility] 03/12/2021, 3:56 PM

## 2021-03-12 NOTE — Plan of Care (Signed)

## 2021-03-12 NOTE — Plan of Care (Signed)

## 2021-03-12 NOTE — Evaluation (Signed)
Occupational Therapy Evaluation Patient Details Name: Nicholas Mckenzie MRN: [id number] DOB: 09/06/1945 Today's Date: 03/12/2021   History of Present Illness Patient is a 75 year old male who presented to hosptial after overdose of medications. PMH: HTN, sleep apnea   Clinical Impression   Patient evaluated by Occupational Therapy with no further acute OT needs identified. All education has been completed and the patient has no further questions. Patient is currently MI with ADL and functional mobility tasks.  See below for any follow-up Occupational Therapy or equipment needs. OT is signing off. Thank you for this referral.       Recommendations for follow up therapy are one component of a multi-disciplinary discharge planning process, led by the attending physician.  Recommendations may be updated based on patient status, additional functional criteria and insurance authorization.   Follow Up Recommendations  No OT follow up    Equipment Recommendations  None recommended by OT    Recommendations for Other Services       Precautions / Restrictions Restrictions Weight Bearing Restrictions: No      Mobility Bed Mobility               General bed mobility comments: in recliner already    Transfers Overall transfer level: Modified independent Equipment used: None                  Balance Overall balance assessment: Modified Independent                                         ADL either performed or assessed with clinical judgement   ADL Overall ADL's : Modified independent                                       General ADL Comments: patient is at baseline for ADLs at this time with patient able to complete toileting, functional mobility in hallway, LB dressing on this date with MI. patients wife was present inquring about waling in community. patient and wife educated on slow build for distance. patient and wife verbalized  understanding.     Vision Patient Visual Report: No change from baseline       Perception     Praxis      Pertinent Vitals/Pain Pain Assessment: No/denies pain     Hand Dominance Right   Extremity/Trunk Assessment Upper Extremity Assessment Upper Extremity Assessment: Overall WFL for tasks assessed   Lower Extremity Assessment Lower Extremity Assessment: Overall WFL for tasks assessed   Cervical / Trunk Assessment Cervical / Trunk Assessment: Normal   Communication Communication Communication: No difficulties   Cognition Arousal/Alertness: Awake/alert Behavior During Therapy: WFL for tasks assessed/performed Overall Cognitive Status: Within Functional Limits for tasks assessed                                     General Comments       Exercises     Shoulder Instructions      Home Living Family/patient expects to be discharged to:: Private residence Living Arrangements: Spouse/significant other Available Help at Discharge: Family Type of Home: House Home Access: Ramped entrance     Home Layout: One level     Bathroom Shower/Tub: Psychologist, counselling  Bathroom Toilet: Standard     Home Equipment: None          Prior Functioning/Environment Level of Independence: Independent                 OT Problem List:        OT Treatment/Interventions:      OT Goals(Current goals can be found in the care plan section) Acute Rehab OT Goals Patient Stated Goal: out of here today OT Goal Formulation: All assessment and education complete, DC therapy  OT Frequency:     Barriers to D/C:            Co-evaluation              AM-PAC OT "6 Clicks" Daily Activity     Outcome Measure Help from another person eating meals?: None Help from another person taking care of personal grooming?: None Help from another person toileting, which includes using toliet, bedpan, or urinal?: None Help from another person bathing (including  washing, rinsing, drying)?: None Help from another person to put on and taking off regular upper body clothing?: None Help from another person to put on and taking off regular lower body clothing?: None 6 Click Score: 24   End of Session Equipment Utilized During Treatment: Gait belt Nurse Communication: Mobility status  Activity Tolerance: Patient tolerated treatment well Patient left: in chair;with call bell/phone within reach;with family/visitor present                   Time: 1521-1541 OT Time Calculation (min): 20 min Charges:  OT General Charges $OT Visit: 1 Visit OT Evaluation $OT Eval Low Complexity: 1 Low  Sharyn Blitz OTR/L, MS Acute Rehabilitation Department Office# [phone] Pager# [phone]   [facility]almers Guest Gemayel Mascio 03/12/2021, 4:21 PM

## 2021-04-01 ENCOUNTER — Other Ambulatory Visit: Payer: Self-pay

## 2021-04-01 ENCOUNTER — Inpatient Hospital Stay (HOSPITAL_COMMUNITY)
Admission: EM | Admit: 2021-04-01 | Discharge: 2021-04-14 | DRG: 698 | Disposition: A | Discharge status: Rehabilitation Facility | Payer: Medicare (Managed Care) | Attending: Internal Medicine | Admitting: Internal Medicine

## 2021-04-01 ENCOUNTER — Emergency Department (HOSPITAL_COMMUNITY): Payer: Medicare (Managed Care)

## 2021-04-01 ENCOUNTER — Encounter (HOSPITAL_COMMUNITY): Payer: Self-pay | Admitting: Emergency Medicine

## 2021-04-01 DIAGNOSIS — T83192A Other mechanical complication of urinary stent, initial encounter: Secondary | ICD-10-CM

## 2021-04-01 DIAGNOSIS — I63431 Cerebral infarction due to embolism of right posterior cerebral artery: Secondary | ICD-10-CM | POA: Diagnosis not present

## 2021-04-01 DIAGNOSIS — Z7189 Other specified counseling: Secondary | ICD-10-CM

## 2021-04-01 DIAGNOSIS — N1330 Unspecified hydronephrosis: Secondary | ICD-10-CM | POA: Diagnosis not present

## 2021-04-01 DIAGNOSIS — Z7982 Long term (current) use of aspirin: Secondary | ICD-10-CM

## 2021-04-01 DIAGNOSIS — I10 Essential (primary) hypertension: Secondary | ICD-10-CM | POA: Diagnosis present

## 2021-04-01 DIAGNOSIS — R270 Ataxia, unspecified: Secondary | ICD-10-CM | POA: Diagnosis not present

## 2021-04-01 DIAGNOSIS — Z8249 Family history of ischemic heart disease and other diseases of the circulatory system: Secondary | ICD-10-CM

## 2021-04-01 DIAGNOSIS — Z8673 Personal history of transient ischemic attack (TIA), and cerebral infarction without residual deficits: Secondary | ICD-10-CM

## 2021-04-01 DIAGNOSIS — G4733 Obstructive sleep apnea (adult) (pediatric): Secondary | ICD-10-CM | POA: Diagnosis present

## 2021-04-01 DIAGNOSIS — T8389XA Other specified complication of genitourinary prosthetic devices, implants and grafts, initial encounter: Principal | ICD-10-CM | POA: Diagnosis present

## 2021-04-01 DIAGNOSIS — N183 Chronic kidney disease, stage 3 unspecified: Secondary | ICD-10-CM | POA: Diagnosis present

## 2021-04-01 DIAGNOSIS — G9340 Encephalopathy, unspecified: Secondary | ICD-10-CM | POA: Diagnosis present

## 2021-04-01 DIAGNOSIS — E861 Hypovolemia: Secondary | ICD-10-CM | POA: Diagnosis present

## 2021-04-01 DIAGNOSIS — R471 Dysarthria and anarthria: Secondary | ICD-10-CM | POA: Diagnosis not present

## 2021-04-01 DIAGNOSIS — B952 Enterococcus as the cause of diseases classified elsewhere: Secondary | ICD-10-CM | POA: Diagnosis present

## 2021-04-01 DIAGNOSIS — R1310 Dysphagia, unspecified: Secondary | ICD-10-CM | POA: Diagnosis not present

## 2021-04-01 DIAGNOSIS — T83192S Other mechanical complication of urinary stent, sequela: Secondary | ICD-10-CM

## 2021-04-01 DIAGNOSIS — N1832 Chronic kidney disease, stage 3b: Secondary | ICD-10-CM | POA: Diagnosis present

## 2021-04-01 DIAGNOSIS — F3341 Major depressive disorder, recurrent, in partial remission: Secondary | ICD-10-CM | POA: Diagnosis present

## 2021-04-01 DIAGNOSIS — I7121 Aneurysm of the ascending aorta, without rupture: Secondary | ICD-10-CM | POA: Diagnosis present

## 2021-04-01 DIAGNOSIS — N1831 Chronic kidney disease, stage 3a: Secondary | ICD-10-CM | POA: Diagnosis present

## 2021-04-01 DIAGNOSIS — N202 Calculus of kidney with calculus of ureter: Secondary | ICD-10-CM | POA: Diagnosis present

## 2021-04-01 DIAGNOSIS — Z66 Do not resuscitate: Secondary | ICD-10-CM | POA: Diagnosis present

## 2021-04-01 DIAGNOSIS — Z7902 Long term (current) use of antithrombotics/antiplatelets: Secondary | ICD-10-CM

## 2021-04-01 DIAGNOSIS — N179 Acute kidney failure, unspecified: Secondary | ICD-10-CM | POA: Diagnosis present

## 2021-04-01 DIAGNOSIS — G9341 Metabolic encephalopathy: Secondary | ICD-10-CM | POA: Diagnosis present

## 2021-04-01 DIAGNOSIS — G4730 Sleep apnea, unspecified: Secondary | ICD-10-CM | POA: Diagnosis present

## 2021-04-01 DIAGNOSIS — R310 Gross hematuria: Secondary | ICD-10-CM | POA: Diagnosis present

## 2021-04-01 DIAGNOSIS — Y831 Surgical operation with implant of artificial internal device as the cause of abnormal reaction of the patient, or of later complication, without mention of misadventure at the time of the procedure: Secondary | ICD-10-CM | POA: Diagnosis present

## 2021-04-01 DIAGNOSIS — Z87442 Personal history of urinary calculi: Secondary | ICD-10-CM

## 2021-04-01 DIAGNOSIS — Z20822 Contact with and (suspected) exposure to covid-19: Secondary | ICD-10-CM | POA: Diagnosis present

## 2021-04-01 DIAGNOSIS — N210 Calculus in bladder: Secondary | ICD-10-CM | POA: Diagnosis present

## 2021-04-01 DIAGNOSIS — H53462 Homonymous bilateral field defects, left side: Secondary | ICD-10-CM | POA: Diagnosis not present

## 2021-04-01 DIAGNOSIS — E669 Obesity, unspecified: Secondary | ICD-10-CM | POA: Diagnosis present

## 2021-04-01 DIAGNOSIS — Z79899 Other long term (current) drug therapy: Secondary | ICD-10-CM

## 2021-04-01 DIAGNOSIS — F419 Anxiety disorder, unspecified: Secondary | ICD-10-CM | POA: Diagnosis present

## 2021-04-01 DIAGNOSIS — E785 Hyperlipidemia, unspecified: Secondary | ICD-10-CM | POA: Diagnosis present

## 2021-04-01 DIAGNOSIS — E860 Dehydration: Secondary | ICD-10-CM | POA: Diagnosis present

## 2021-04-01 DIAGNOSIS — N136 Pyonephrosis: Secondary | ICD-10-CM | POA: Diagnosis present

## 2021-04-01 DIAGNOSIS — R7401 Elevation of levels of liver transaminase levels: Secondary | ICD-10-CM | POA: Diagnosis present

## 2021-04-01 DIAGNOSIS — Z9114 Patient's other noncompliance with medication regimen: Secondary | ICD-10-CM

## 2021-04-01 DIAGNOSIS — Z6832 Body mass index (BMI) 32.0-32.9, adult: Secondary | ICD-10-CM

## 2021-04-01 DIAGNOSIS — I129 Hypertensive chronic kidney disease with stage 1 through stage 4 chronic kidney disease, or unspecified chronic kidney disease: Secondary | ICD-10-CM | POA: Diagnosis present

## 2021-04-01 HISTORY — DX: Chronic kidney disease, stage 3 unspecified: N18.30

## 2021-04-01 LAB — CBC WITH DIFFERENTIAL/PLATELET
Abs Immature Granulocytes: 0.28 10*3/uL — ABNORMAL HIGH (ref 0.00–0.07)
Basophils Absolute: 0 10*3/uL (ref 0.0–0.1)
Basophils Relative: 0 %
Eosinophils Absolute: 0 10*3/uL (ref 0.0–0.5)
Eosinophils Relative: 0 %
HCT: 37 % — ABNORMAL LOW (ref 39.0–52.0)
Hemoglobin: 11.8 g/dL — ABNORMAL LOW (ref 13.0–17.0)
Immature Granulocytes: 2 %
Lymphocytes Relative: 3 %
Lymphs Abs: 0.4 10*3/uL — ABNORMAL LOW (ref 0.7–4.0)
MCH: 28.6 pg (ref 26.0–34.0)
MCHC: 31.9 g/dL (ref 30.0–36.0)
MCV: 89.8 fL (ref 80.0–100.0)
Monocytes Absolute: 0.7 10*3/uL (ref 0.1–1.0)
Monocytes Relative: 6 %
Neutro Abs: 10.6 10*3/uL — ABNORMAL HIGH (ref 1.7–7.7)
Neutrophils Relative %: 89 %
Platelets: 340 10*3/uL (ref 150–400)
RBC: 4.12 MIL/uL — ABNORMAL LOW (ref 4.22–5.81)
RDW: 15.4 % (ref 11.5–15.5)
WBC: 12 10*3/uL — ABNORMAL HIGH (ref 4.0–10.5)
nRBC: 0 % (ref 0.0–0.2)

## 2021-04-01 LAB — COMPREHENSIVE METABOLIC PANEL
ALT: 46 U/L — ABNORMAL HIGH (ref 0–44)
AST: 42 U/L — ABNORMAL HIGH (ref 15–41)
Albumin: 2.2 g/dL — ABNORMAL LOW (ref 3.5–5.0)
Alkaline Phosphatase: 75 U/L (ref 38–126)
Anion gap: 10 (ref 5–15)
BUN: 39 mg/dL — ABNORMAL HIGH (ref 8–23)
CO2: 22 mmol/L (ref 22–32)
Calcium: 8.7 mg/dL — ABNORMAL LOW (ref 8.9–10.3)
Chloride: 111 mmol/L (ref 98–111)
Creatinine, Ser: 1.63 mg/dL — ABNORMAL HIGH (ref 0.61–1.24)
GFR, Estimated: 44 mL/min — ABNORMAL LOW (ref >60)
Glucose, Bld: 134 mg/dL — ABNORMAL HIGH (ref 70–99)
Potassium: 3.5 mmol/L (ref 3.5–5.1)
Sodium: 143 mmol/L (ref 135–145)
Total Bilirubin: 0.8 mg/dL (ref 0.3–1.2)
Total Protein: 6.2 g/dL — ABNORMAL LOW (ref 6.5–8.1)

## 2021-04-01 LAB — RESP PANEL BY RT-PCR (FLU A&B, COVID) ARPGX2
Influenza A by PCR: NEGATIVE
Influenza B by PCR: NEGATIVE
SARS Coronavirus 2 by RT PCR: NEGATIVE

## 2021-04-01 LAB — LACTIC ACID, PLASMA: Lactic Acid, Venous: 1.3 mmol/L (ref 0.5–1.9)

## 2021-04-01 LAB — LIPASE, BLOOD: Lipase: 40 U/L (ref 11–51)

## 2021-04-01 IMAGING — CT CT ABD-PELV W/O CM
2 of 4 series · 15 of 46 positions shown, 17 images · non-contrast
Comparison: [DATE]

CLINICAL DATA: Diarrhea, weakness, dizziness for 2 weeks

EXAM:
CT ABDOMEN AND PELVIS WITHOUT CONTRAST
TECHNIQUE: Multidetector CT imaging of the abdomen and pelvis was performed
following the standard protocol without IV contrast. Unenhanced CT
was performed per clinician order. Lack of IV contrast limits
sensitivity and specificity, especially for evaluation of
abdominal/pelvic solid viscera.

[Series 2: axial st · axial · 0.92mm/px · z∈[-543,-88]mm · 12 of 105 slices shown, 14 images]
[im 7/105  soft-tissue]
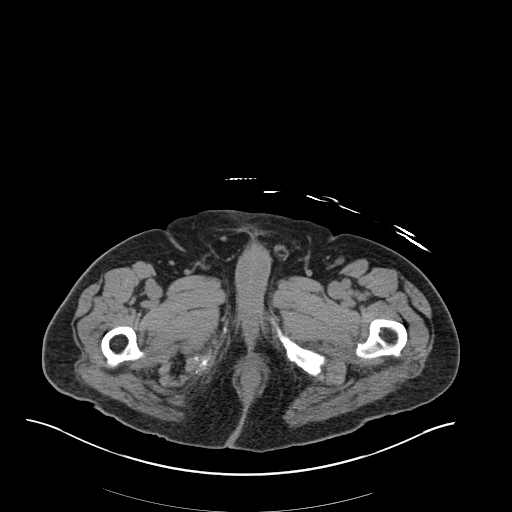
[im 7/105  bone]
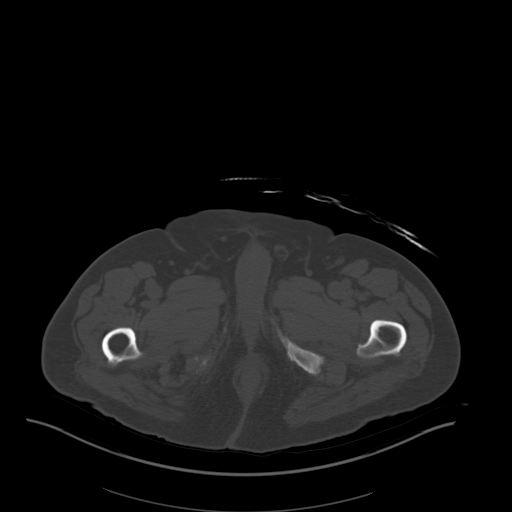
[im 14/105  soft-tissue]
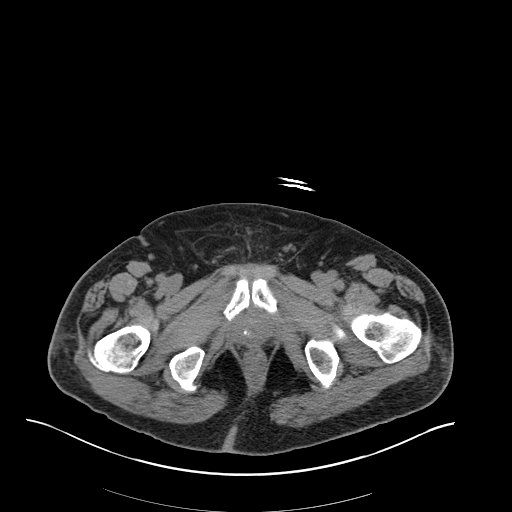
[im 27/105  soft-tissue]
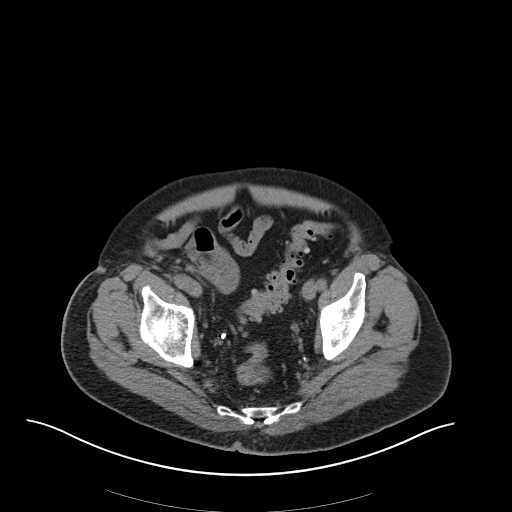
[im 33/105  soft-tissue]
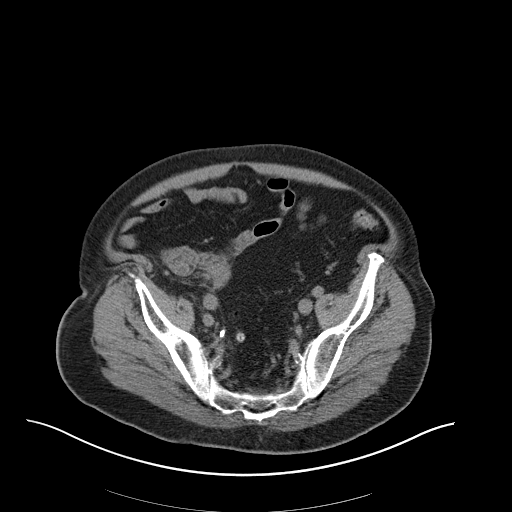
[im 40/105  soft-tissue]
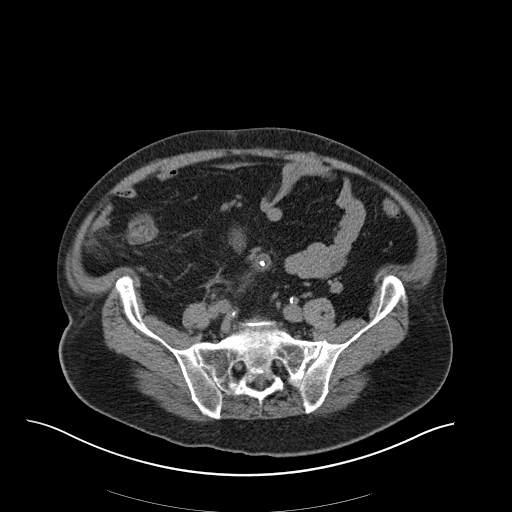
[im 46/105  soft-tissue]
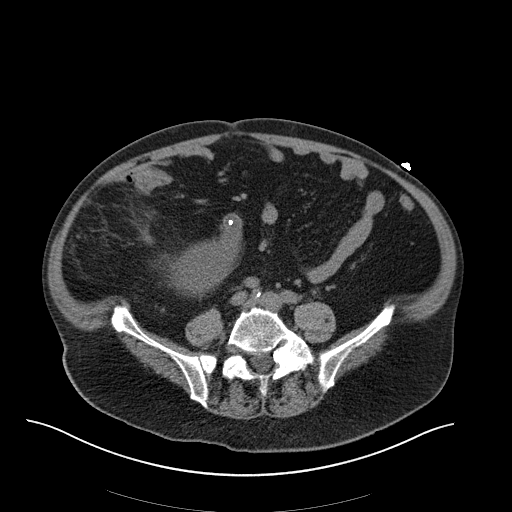
[im 59/105  soft-tissue]
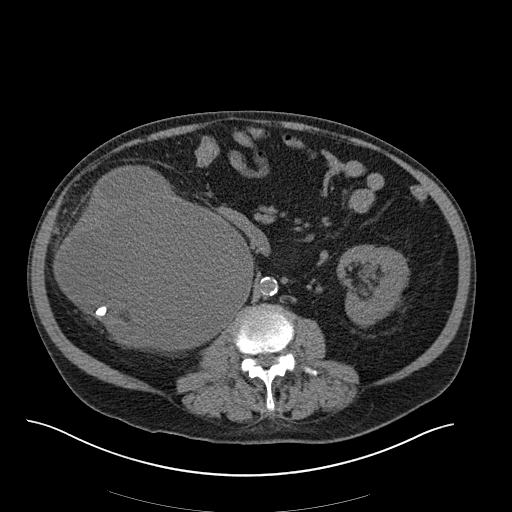
[im 66/105  soft-tissue]
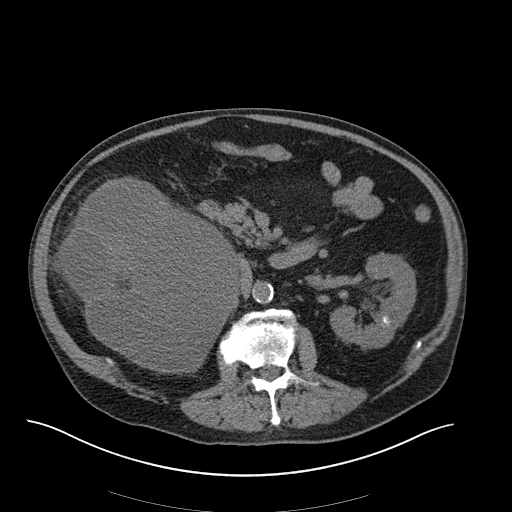
[im 72/105  soft-tissue]
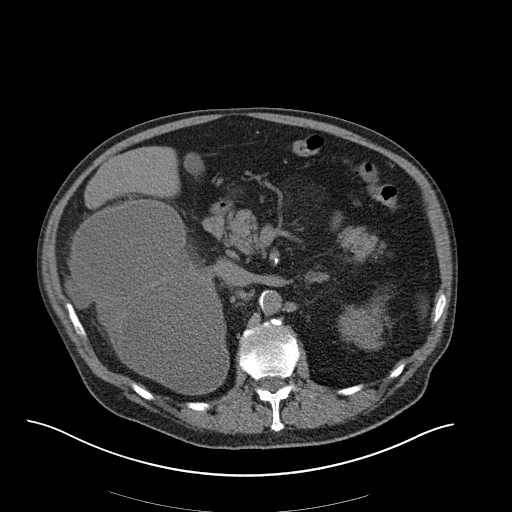
[im 72/105  bone]
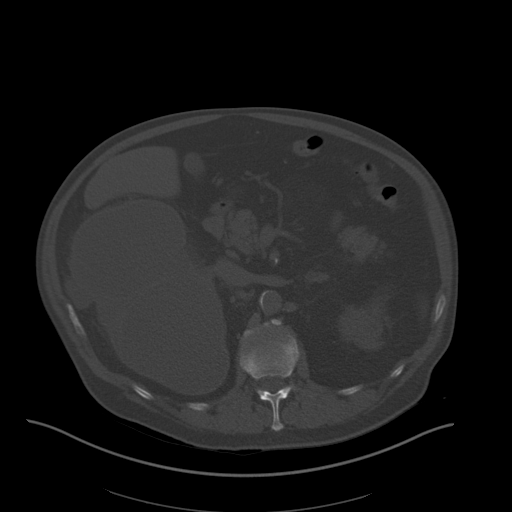
[im 79/105  soft-tissue]
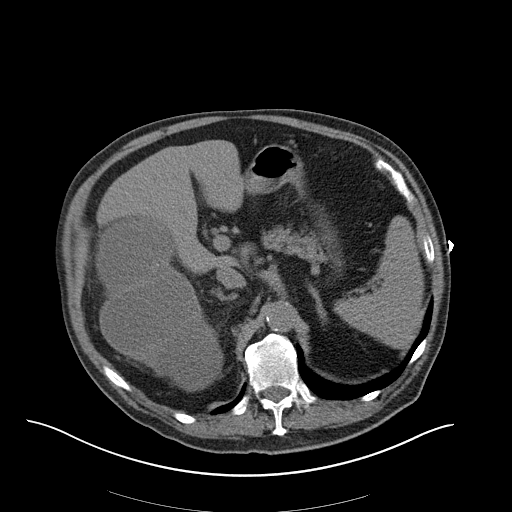
[im 92/105  soft-tissue]
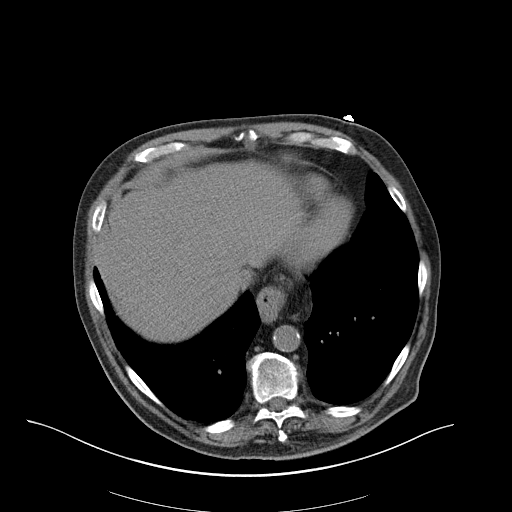
[im 98/105  soft-tissue]
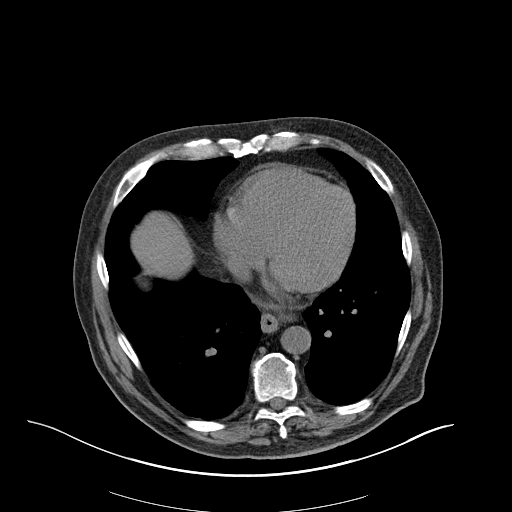

[Series 5: coronal st · coronal · 0.91mm/px · 3 of 185 slices shown]
[im 62/185  soft-tissue]
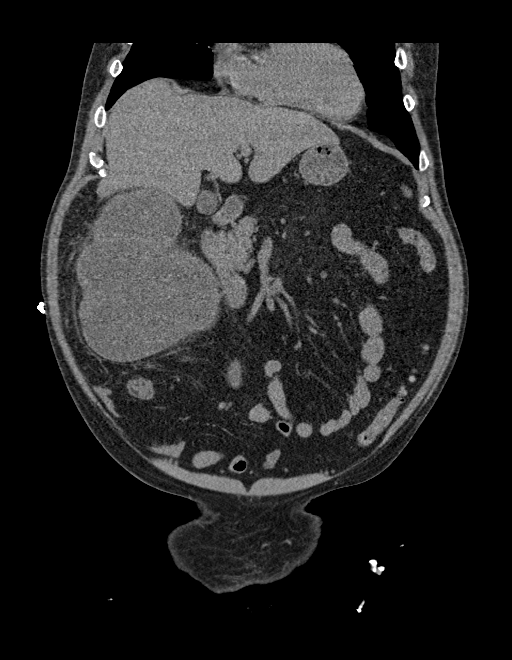
[im 82/185  soft-tissue]
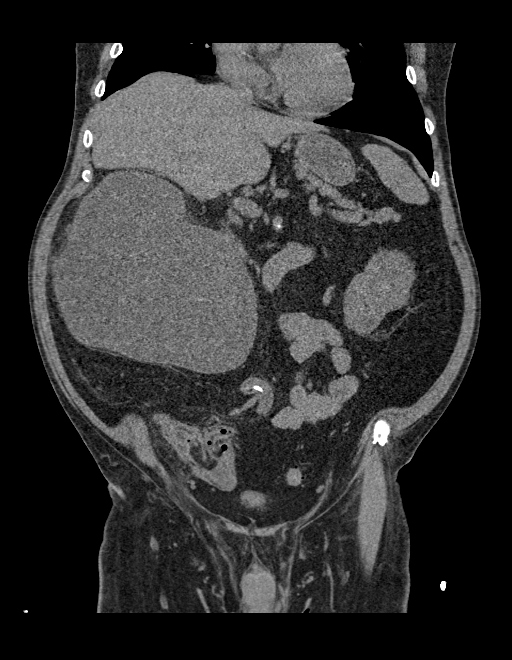
[im 103/185  soft-tissue]
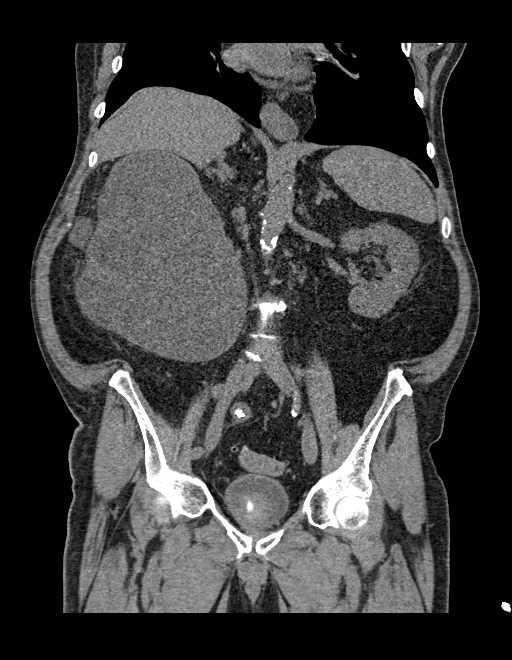

[15 of 46 positions shown; findings below may reference images not displayed]

FINDINGS: Lower chest: No acute pleural or parenchymal lung disease. Small
hiatal hernia.

Hepatobiliary: No focal liver abnormality is seen. No gallstones,
gallbladder wall thickening, or biliary dilatation.

Pancreas: Unremarkable. No pancreatic ductal dilatation or
surrounding inflammatory changes.

Spleen: Normal in size without focal abnormality.

Adrenals/Urinary Tract: There is progressive severe right-sided
obstructive uropathy with worsening hydronephrosis and hydroureter.
Nonobstructing right renal calculus measuring 1 cm again noted. An
indwelling right ureteral stent is coiled within the dilated right
renal pelvis, and extends into the bladder lumen. A large concretion
surrounds the distal aspect of the stent within the bladder lumen,
measuring 3.1 cm, likely resulting in stent malfunction. There are
multiple right ureteral calculi along the course of the right
ureteral stent, largest measuring 1.1 cm.

Stable small caliceal calculi within the upper pole left kidney
measuring up to 7 mm. No left-sided obstruction.

The bladder is decompressed, limiting its evaluation. The adrenals
are unremarkable.

Stomach/Bowel: No bowel obstruction or ileus. Extensive
diverticulosis of the distal colon without diverticulitis. No bowel
wall thickening or inflammatory change.

Vascular/Lymphatic: Aortic atherosclerosis. No enlarged abdominal or
pelvic lymph nodes.

Reproductive: Prostate is unremarkable.

Other: No free fluid or free intraperitoneal gas. Stable fat
containing right inguinal hernia. No bowel herniation.

Musculoskeletal: No acute or destructive bony lesions. Reconstructed
images demonstrate no additional findings.
IMPRESSION: 1. Progressive severe right-sided hydronephrosis and hydroureter,
likely due to malfunction of the indwelling right ureteral stent. A
3.1 cm concretion surrounding the distal margin of the right
ureteral stent within the bladder lumen likely results in stent
obstruction.
2. Bilateral nonobstructing renal calculi.
3. Multiple right ureteral calculi are seen along the course of the
ureteral stent, largest measuring 1.1 cm. These are stable.
4. Distal colonic diverticulosis without diverticulitis.
5. Small hiatal hernia.
6.  Choose 1

## 2021-04-01 MED ORDER — ONDANSETRON 4 MG PO TBDP: 4.0000 mg | ORAL_TABLET | Freq: Once | ORAL | Status: DC

## 2021-04-01 MED ORDER — ACETAMINOPHEN 325 MG PO TABS
650.0000 mg | ORAL_TABLET | Freq: Once | ORAL | Status: AC
  Administered 2021-04-01: 650 mg via ORAL
  Filled 2021-04-01: qty 2

## 2021-04-01 MED ORDER — LACTATED RINGERS IV BOLUS
1000.0000 mL | Freq: Once | INTRAVENOUS | Status: AC
  Administered 2021-04-01: 1000 mL via INTRAVENOUS

## 2021-04-01 MED ORDER — ONDANSETRON HCL 4 MG/2ML IJ SOLN
4.0000 mg | Freq: Once | INTRAMUSCULAR | Status: AC
  Administered 2021-04-01: 4 mg via INTRAVENOUS
  Filled 2021-04-01: qty 2

## 2021-04-01 NOTE — ED Provider Notes (Signed)
[facility]-EMERGENCY DEPT Provider Note   CSN: [id number] Arrival date & time: 04/01/21  1935     History Chief Complaint  Patient presents with   Emesis   Diarrhea   Weakness    Nicholas Mckenzie is a 75 y.o. male.  HPI  Patient is a 75 year old male with a medical history as noted below.  He presents to the emergency department due to abdominal pain, nausea, vomiting, diarrhea, weakness, fevers.  States his symptoms started yesterday and have been persistent.  He has been unable to tolerate any significant p.o. intake due to his symptoms.  Denies any hematemesis or hematochezia.  States his abdominal pain is diffuse.  Denies any cough, runny nose, sore throat, urinary complaints, chest pain, shortness of breath.     Patient has a history of kidney stones and previously had a right ureteral stent placed in [place] about 3 years ago which she never had removed since moving to [place].  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  Patient was seen on August 19 of this year for hematuria and back pain.  He was found to have severe hydronephrosis.  Dr. Berneice Heinrich was consulted with urology who recommended discharge and outpatient follow-up.   I spoke to patient's wife who is now at bedside.  She states that they have been trying to reach out to local urology offices in [place] but have been unsuccessful in getting a follow-up appointment and having his stent removed.  She states that over the past month he has had decreased appetite as well as around 20 pounds of weight loss.  She states that he is becoming more intermittently confused over the past week and states that at times he will have difficulty finding his way around his own home.     Past Medical History:  Diagnosis Date   Hypertension    Kidney stones    Sleep apnea     Patient Active Problem List   Diagnosis Date Noted   Multiple drug overdose,  accidental or unintentional, initial encounter 03/11/2021   Sinus bradycardia 03/11/2021   CKD (chronic kidney disease) stage 3a 03/11/2021   Major depressive disorder, recurrent episode, in partial remission (HCC) 03/11/2021   Hypertension    Sleep apnea     Past Surgical History:  Procedure Laterality Date   CATARACT EXTRACTION     kidney stent     MANDIBLE FRACTURE SURGERY         Family History  Problem Relation Age of Onset   Hypertension Mother    Hypertension Other     Social History   Tobacco Use   Smoking status: Never   Smokeless tobacco: Never  Substance Use Topics   Alcohol use: Yes    Comment: Socially; twice a week    Home Medications Prior to Admission medications   Medication Sig Start Date End Date Taking? Authorizing Provider  acetaminophen (TYLENOL) 325 MG tablet Take 2 tablets (650 mg total) by mouth every 6 (six) hours as needed for mild pain (or Fever >/= 101). 03/12/21   Rodolph Bong, MD  amLODipine (NORVASC) 10 MG tablet Take 1 tablet (10 mg total) by mouth daily. 03/12/21   Rodolph Bong, MD  amoxicillin-clavulanate (AUGMENTIN) 875-125 MG tablet Take 1 tablet by mouth every 12 (twelve) hours. 03/12/21   Rodolph Bong, MD  FLUoxetine (PROZAC) 20 MG capsule Take 1 capsule by mouth daily. 06/29/20  [provider]  hydrALAZINE (APRESOLINE) 25 MG tablet Take 1 tablet (25 mg total) by mouth every 8 (eight) hours. 03/12/21   Rodolph Bong, MD  Hydrocortisone, Perianal, 1 % CREA Place 1 application rectally daily as needed for pain. 03/19/19   [provider]  Multiple Vitamin (MULTIVITAMIN ADULT PO) Take 1 tablet by mouth daily.    [provider]  OVER THE COUNTER MEDICATION Take 1 tablet by mouth daily. Magnesium + Potassium + Bromelain supplement by [facility]    [provider]  OVER THE COUNTER MEDICATION Take 1 tablet by mouth at bedtime. Dan Maker for sleep.    [provider]   rosuvastatin (CRESTOR) 10 MG tablet Take 10 mg by mouth at bedtime. 01/15/21   [provider]  sildenafil (REVATIO) 20 MG tablet Take 4 tablets by mouth daily as needed (ED). 01/11/21   [provider]  telmisartan (MICARDIS) 80 MG tablet Take 1 tablet by mouth daily. 02/24/21   [provider]  temazepam (RESTORIL) 30 MG capsule Take 30 mg by mouth at bedtime as needed for sleep. 03/10/21   [provider]    Allergies    Patient has no known allergies.  Review of Systems   Review of Systems  All other systems reviewed and are negative. Ten systems reviewed and are negative for acute change, except as noted in the HPI.   Physical Exam Updated Vital Signs BP 133/80 (BP Location: Right Arm)   Pulse 74   Temp 98.9 F (37.2 C) (Oral)   Resp 18   Wt 98.4 kg   SpO2 96%   BMI 32.99 kg/m   Physical Exam Vitals and nursing note reviewed.  Constitutional:      General: He is not in acute distress.    Appearance: Normal appearance. He is not ill-appearing, toxic-appearing or diaphoretic.  HENT:     Head: Normocephalic and atraumatic.     Right Ear: External ear normal.     Left Ear: External ear normal.     Nose: Nose normal.     Mouth/Throat:     Mouth: Mucous membranes are moist.     Pharynx: Oropharynx is clear. No oropharyngeal exudate or posterior oropharyngeal erythema.  Eyes:     General: No scleral icterus.       Right eye: No discharge.        Left eye: No discharge.     Extraocular Movements: Extraocular movements intact.     Conjunctiva/sclera: Conjunctivae normal.  Cardiovascular:     Rate and Rhythm: Normal rate and regular rhythm.     Pulses: Normal pulses.     Heart sounds: Normal heart sounds. No murmur heard.   No friction rub. No gallop.  Pulmonary:     Effort: Pulmonary effort is normal. No respiratory distress.     Breath sounds: Normal breath sounds. No stridor. No wheezing, rhonchi or rales.  Abdominal:     General:  Abdomen is flat.     Palpations: Abdomen is soft.     Tenderness: There is abdominal tenderness.     Comments: Abdomen is soft.  Mildly distended.  Diffuse tenderness noted across the abdomen that appears to be worst along the epigastrium.  Musculoskeletal:        General: Normal range of motion.     Cervical back: Normal range of motion and neck supple. No tenderness.  Skin:    General: Skin is warm and dry.  Neurological:  General: No focal deficit present.     Mental Status: He is alert and oriented to person, place, and time.     Comments: A&O x3.  Speaking clearly and coherently.  Moving all 4 extremities.  No gross deficits.  Psychiatric:        Mood and Affect: Mood normal.        Behavior: Behavior normal.   ED Results / Procedures / Treatments   Labs (all labs ordered are listed, but only abnormal results are displayed) Labs Reviewed  CBC WITH DIFFERENTIAL/PLATELET - Abnormal; Notable for the following components:      Result Value   WBC 12.0 (*)    RBC 4.12 (*)    Hemoglobin 11.8 (*)    HCT 37.0 (*)    Neutro Abs 10.6 (*)    Lymphs Abs 0.4 (*)    Abs Immature Granulocytes 0.28 (*)    All other components within normal limits  COMPREHENSIVE METABOLIC PANEL - Abnormal; Notable for the following components:   Glucose, Bld 134 (*)    BUN 39 (*)    Creatinine, Ser 1.63 (*)    Calcium 8.7 (*)    Total Protein 6.2 (*)    Albumin 2.2 (*)    AST 42 (*)    ALT 46 (*)    GFR, Estimated 44 (*)    All other components within normal limits  RESP PANEL BY RT-PCR (FLU A&B, COVID) ARPGX2  LIPASE, BLOOD  LACTIC ACID, PLASMA  URINALYSIS, ROUTINE W REFLEX MICROSCOPIC  LACTIC ACID, PLASMA   EKG None  Radiology CT ABDOMEN PELVIS WO CONTRAST  Result Date: 04/01/2021 CLINICAL DATA:  Diarrhea, weakness, dizziness for 2 weeks EXAM: CT ABDOMEN AND PELVIS WITHOUT CONTRAST TECHNIQUE: Multidetector CT imaging of the abdomen and pelvis was performed following the standard protocol  without IV contrast. Unenhanced CT was performed per clinician order. Lack of IV contrast limits sensitivity and specificity, especially for evaluation of abdominal/pelvic solid viscera. COMPARISON:  02/10/2021 FINDINGS: Lower chest: No acute pleural or parenchymal lung disease. Small hiatal hernia. Hepatobiliary: No focal liver abnormality is seen. No gallstones, gallbladder wall thickening, or biliary dilatation. Pancreas: Unremarkable. No pancreatic ductal dilatation or surrounding inflammatory changes. Spleen: Normal in size without focal abnormality. Adrenals/Urinary Tract: There is progressive severe right-sided obstructive uropathy with worsening hydronephrosis and hydroureter. Nonobstructing right renal calculus measuring 1 cm again noted. An indwelling right ureteral stent is coiled within the dilated right renal pelvis, and extends into the bladder lumen. A large concretion surrounds the distal aspect of the stent within the bladder lumen, measuring 3.1 cm, likely resulting in stent malfunction. There are multiple right ureteral calculi along the course of the right ureteral stent, largest measuring 1.1 cm. Stable small caliceal calculi within the upper pole left kidney measuring up to 7 mm. No left-sided obstruction. The bladder is decompressed, limiting its evaluation. The adrenals are unremarkable. Stomach/Bowel: No bowel obstruction or ileus. Extensive diverticulosis of the distal colon without diverticulitis. No bowel wall thickening or inflammatory change. Vascular/Lymphatic: Aortic atherosclerosis. No enlarged abdominal or pelvic lymph nodes. Reproductive: Prostate is unremarkable. Other: No free fluid or free intraperitoneal gas. Stable fat containing right inguinal hernia. No bowel herniation. Musculoskeletal: No acute or destructive bony lesions. Reconstructed images demonstrate no additional findings. IMPRESSION: 1. Progressive severe right-sided hydronephrosis and hydroureter, likely due to  malfunction of the indwelling right ureteral stent. A 3.1 cm concretion surrounding the distal margin of the right ureteral stent within the bladder lumen likely results in  stent obstruction. 2. Bilateral nonobstructing renal calculi. 3. Multiple right ureteral calculi are seen along the course of the ureteral stent, largest measuring 1.1 cm. These are stable. 4. Distal colonic diverticulosis without diverticulitis. 5. Small hiatal hernia. 6.  Choose 1 Electronically Signed   By: Sharlet Salina M.D.   On: 04/01/2021 22:06    Procedures Procedures   Medications Ordered in ED Medications  lactated ringers bolus 1,000 mL (1,000 mLs Intravenous New Bag/Given 04/01/21 2252)  ondansetron (ZOFRAN) injection 4 mg (4 mg Intravenous Given 04/01/21 2251)  acetaminophen (TYLENOL) tablet 650 mg (650 mg Oral Given 04/01/21 2226)    ED Course  I have reviewed the triage vital signs and the nursing notes.  Pertinent labs & imaging results that were available during my care of the patient were reviewed by me and considered in my medical decision making (see chart for details).  Clinical Course as of 04/01/21 2341  Sat Apr 01, 2021  2322 WBC(!): 12.0 [LJ]  2322 NEUT#(!): 10.6 [LJ]  2322 Abs Immature Granulocytes(!): 0.28 [LJ]  2335 Creatinine(!): 1.63 [LJ]  2335 GFR, Estimated(!): 44 [LJ]    Clinical Course User Index [LJ] Placido Sou, PA-C   MDM Rules/Calculators/A&P                          Pt is a 75 y.o. male who presents to the emergency department due to nausea, vomiting, diarrhea, abdominal pain.  Labs: CBC with a white count of 12, neutrophils of 10.6, lymphocytes of 0.4, absolute immature granulocytes of 0.28. CMP with a glucose of 134, BUN of 39, creatinine of 1.63, calcium of 8.7, total protein of 6.2, albumin of 2.2, AST of 42, ALT of 46, GFR 44. Respiratory panel is negative. Lactic acid 1.3. Lipase of 40. UA pending.  Imaging: CT scan of the abdomen/pelvis without contrast  showing IMPRESSION: 1. Progressive severe right-sided hydronephrosis and hydroureter, likely due to malfunction of the indwelling right ureteral stent. A 3.1 cm concretion surrounding the distal margin of the right ureteral stent within the bladder lumen likely results in stent obstruction. 2. Bilateral nonobstructing renal calculi. 3. Multiple right ureteral calculi are seen along the course of the ureteral stent, largest measuring 1.1 cm. These are stable. 4. Distal colonic diverticulosis without diverticulitis. 5. Small hiatal hernia.  CT scan of the head without contrast is pending.  I, Placido Sou, PA-C, personally reviewed and evaluated these images and lab results as part of my medical decision-making.  Patient appears to have worsening severe right-sided hydronephrosis and hydroureter which is likely due to a malfunction of a right-sided indwelling right ureteral stent which was placed in [place] about 3 years ago.  He was seen for hematuria as well as back pain in August of this year and was discussed with urology who recommended outpatient follow-up.  His wife is at bedside and states that they have been unable to secure an appointment with urology since that ED visit.  She notes that he has had decreased p.o. intake as well as about a 20 pound weight loss over the past month.  She feels as if he is becoming more altered intermittently over the past week and sometimes will have difficulty finding rooms in his own home.  Lab work with a CBC showing leukocytosis as well as neutrophilia.  CMP concerning for mild AKI.  Lactic acid within normal limits at 1.3.  Given patient's CT findings and altered mental status will obtain a CT  scan of the head as well as a UA.  Patient likely experiencing a right infected stent.  Feel that he will likely require admission and urology consultation.  Note: Portions of this report may have been transcribed using voice recognition software. Every effort was  made to ensure accuracy; however, inadvertent computerized transcription errors may be present.   Final Clinical Impression(s) / ED Diagnoses Final diagnoses:  Hydroureteronephrosis  AKI (acute kidney injury) [facility])   Rx / DC Orders ED Discharge Orders     None        Placido Sou, PA-C 04/01/21 2348    Cheryll Cockayne, MD 04/12/21 [phone]

## 2021-04-01 NOTE — ED Triage Notes (Signed)
Pt BIB EMS from home c/o diarrhea, weakness, dizziness, and pale x 2 weeks that worsened today. A&o x 4.

## 2021-04-01 NOTE — ED Provider Notes (Signed)
Emergency Medicine Provider Triage Evaluation Note  Nicholas Mckenzie , a 75 y.o. male  was evaluated in triage.  Pt complains of weakness.  Review of Systems  Positive: Weak ness, abdominal pain, nausea, vomiting, diarrhea, fatigue Negative: Cough, dysuria  Physical Exam  There were no vitals taken for this visit. Gen:   Awake, ill appearing Resp:  Normal effort  MSK:   Moves extremities without difficulty  Other:    Medical Decision Making  Medically screening exam initiated at 7:55 PM.  Appropriate orders placed.  Chet Greenley was informed that the remainder of the evaluation will be completed by another provider, this initial triage assessment does not replace that evaluation, and the importance of remaining in the ED until their evaluation is complete.  Pt here with n/v//d, weakness, fatigue and abd pain x 2 weeks.  Pt is ill appearing.    Fayrene Helper, PA-C 04/01/21 2002    Maia Plan, MD 04/03/21 [id number]

## 2021-04-02 ENCOUNTER — Emergency Department (HOSPITAL_COMMUNITY): Payer: Medicare (Managed Care)

## 2021-04-02 ENCOUNTER — Encounter (HOSPITAL_COMMUNITY): Payer: Self-pay | Admitting: Family Medicine

## 2021-04-02 DIAGNOSIS — E785 Hyperlipidemia, unspecified: Secondary | ICD-10-CM | POA: Diagnosis present

## 2021-04-02 DIAGNOSIS — Z6832 Body mass index (BMI) 32.0-32.9, adult: Secondary | ICD-10-CM | POA: Diagnosis not present

## 2021-04-02 DIAGNOSIS — N1831 Chronic kidney disease, stage 3a: Secondary | ICD-10-CM | POA: Diagnosis present

## 2021-04-02 DIAGNOSIS — H53462 Homonymous bilateral field defects, left side: Secondary | ICD-10-CM | POA: Diagnosis not present

## 2021-04-02 DIAGNOSIS — Z66 Do not resuscitate: Secondary | ICD-10-CM | POA: Diagnosis present

## 2021-04-02 DIAGNOSIS — F3341 Major depressive disorder, recurrent, in partial remission: Secondary | ICD-10-CM | POA: Diagnosis present

## 2021-04-02 DIAGNOSIS — Y831 Surgical operation with implant of artificial internal device as the cause of abnormal reaction of the patient, or of later complication, without mention of misadventure at the time of the procedure: Secondary | ICD-10-CM | POA: Diagnosis present

## 2021-04-02 DIAGNOSIS — I7121 Aneurysm of the ascending aorta, without rupture: Secondary | ICD-10-CM | POA: Diagnosis present

## 2021-04-02 DIAGNOSIS — G9340 Encephalopathy, unspecified: Secondary | ICD-10-CM | POA: Diagnosis not present

## 2021-04-02 DIAGNOSIS — I63431 Cerebral infarction due to embolism of right posterior cerebral artery: Secondary | ICD-10-CM | POA: Diagnosis not present

## 2021-04-02 DIAGNOSIS — R270 Ataxia, unspecified: Secondary | ICD-10-CM | POA: Diagnosis not present

## 2021-04-02 DIAGNOSIS — I10 Essential (primary) hypertension: Secondary | ICD-10-CM

## 2021-04-02 DIAGNOSIS — E860 Dehydration: Secondary | ICD-10-CM | POA: Diagnosis present

## 2021-04-02 DIAGNOSIS — T83192A Other mechanical complication of urinary stent, initial encounter: Secondary | ICD-10-CM

## 2021-04-02 DIAGNOSIS — T8389XA Other specified complication of genitourinary prosthetic devices, implants and grafts, initial encounter: Secondary | ICD-10-CM | POA: Diagnosis present

## 2021-04-02 DIAGNOSIS — G4730 Sleep apnea, unspecified: Secondary | ICD-10-CM

## 2021-04-02 DIAGNOSIS — R310 Gross hematuria: Secondary | ICD-10-CM | POA: Diagnosis present

## 2021-04-02 DIAGNOSIS — R7401 Elevation of levels of liver transaminase levels: Secondary | ICD-10-CM | POA: Diagnosis present

## 2021-04-02 DIAGNOSIS — N210 Calculus in bladder: Secondary | ICD-10-CM | POA: Diagnosis present

## 2021-04-02 DIAGNOSIS — N1330 Unspecified hydronephrosis: Secondary | ICD-10-CM | POA: Diagnosis present

## 2021-04-02 DIAGNOSIS — N179 Acute kidney failure, unspecified: Secondary | ICD-10-CM | POA: Diagnosis present

## 2021-04-02 DIAGNOSIS — I129 Hypertensive chronic kidney disease with stage 1 through stage 4 chronic kidney disease, or unspecified chronic kidney disease: Secondary | ICD-10-CM | POA: Diagnosis present

## 2021-04-02 DIAGNOSIS — Z20822 Contact with and (suspected) exposure to covid-19: Secondary | ICD-10-CM | POA: Diagnosis present

## 2021-04-02 DIAGNOSIS — I6389 Other cerebral infarction: Secondary | ICD-10-CM | POA: Diagnosis not present

## 2021-04-02 DIAGNOSIS — R471 Dysarthria and anarthria: Secondary | ICD-10-CM | POA: Diagnosis not present

## 2021-04-02 DIAGNOSIS — E669 Obesity, unspecified: Secondary | ICD-10-CM | POA: Diagnosis present

## 2021-04-02 DIAGNOSIS — T83192S Other mechanical complication of urinary stent, sequela: Secondary | ICD-10-CM

## 2021-04-02 DIAGNOSIS — N136 Pyonephrosis: Secondary | ICD-10-CM | POA: Diagnosis present

## 2021-04-02 DIAGNOSIS — B952 Enterococcus as the cause of diseases classified elsewhere: Secondary | ICD-10-CM | POA: Diagnosis present

## 2021-04-02 DIAGNOSIS — N202 Calculus of kidney with calculus of ureter: Secondary | ICD-10-CM | POA: Diagnosis present

## 2021-04-02 DIAGNOSIS — G9341 Metabolic encephalopathy: Secondary | ICD-10-CM | POA: Diagnosis present

## 2021-04-02 DIAGNOSIS — R1310 Dysphagia, unspecified: Secondary | ICD-10-CM | POA: Diagnosis not present

## 2021-04-02 LAB — URINALYSIS, ROUTINE W REFLEX MICROSCOPIC
Bilirubin Urine: NEGATIVE
Glucose, UA: NEGATIVE mg/dL
Hgb urine dipstick: NEGATIVE
Ketones, ur: 5 mg/dL — AB
Nitrite: NEGATIVE
Protein, ur: 30 mg/dL — AB
Specific Gravity, Urine: 1.016 (ref 1.005–1.030)
WBC, UA: >50 WBC/hpf — ABNORMAL HIGH (ref 0–5)
pH: 5 (ref 5.0–8.0)

## 2021-04-02 LAB — CBC
HCT: 36.2 % — ABNORMAL LOW (ref 39.0–52.0)
Hemoglobin: 11.7 g/dL — ABNORMAL LOW (ref 13.0–17.0)
MCH: 28.7 pg (ref 26.0–34.0)
MCHC: 32.3 g/dL (ref 30.0–36.0)
MCV: 88.7 fL (ref 80.0–100.0)
Platelets: 403 10*3/uL — ABNORMAL HIGH (ref 150–400)
RBC: 4.08 MIL/uL — ABNORMAL LOW (ref 4.22–5.81)
RDW: 15.6 % — ABNORMAL HIGH (ref 11.5–15.5)
WBC: 8.7 10*3/uL (ref 4.0–10.5)
nRBC: 0 % (ref 0.0–0.2)

## 2021-04-02 LAB — COMPREHENSIVE METABOLIC PANEL
ALT: 41 U/L (ref 0–44)
AST: 32 U/L (ref 15–41)
Albumin: 2.3 g/dL — ABNORMAL LOW (ref 3.5–5.0)
Alkaline Phosphatase: 74 U/L (ref 38–126)
Anion gap: 13 (ref 5–15)
BUN: 37 mg/dL — ABNORMAL HIGH (ref 8–23)
CO2: 24 mmol/L (ref 22–32)
Calcium: 9.2 mg/dL (ref 8.9–10.3)
Chloride: 107 mmol/L (ref 98–111)
Creatinine, Ser: 1.59 mg/dL — ABNORMAL HIGH (ref 0.61–1.24)
GFR, Estimated: 46 mL/min — ABNORMAL LOW (ref >60)
Glucose, Bld: 117 mg/dL — ABNORMAL HIGH (ref 70–99)
Potassium: 3.6 mmol/L (ref 3.5–5.1)
Sodium: 144 mmol/L (ref 135–145)
Total Bilirubin: 0.6 mg/dL (ref 0.3–1.2)
Total Protein: 6.2 g/dL — ABNORMAL LOW (ref 6.5–8.1)

## 2021-04-02 LAB — LACTIC ACID, PLASMA: Lactic Acid, Venous: 1 mmol/L (ref 0.5–1.9)

## 2021-04-02 IMAGING — CT CT HEAD W/O CM
3 of 4 series · 14 of 47 positions shown, 16 images · non-contrast
Comparison: None.

CLINICAL DATA: Delirium

EXAM:
CT HEAD WITHOUT CONTRAST
TECHNIQUE: Contiguous axial images were obtained from the base of the skull
through the vertex without intravenous contrast.

[Series 7: coronal soft tissue · coronal · 0.35mm/px · 3 of 76 slices shown]
[im 26/76  brain]
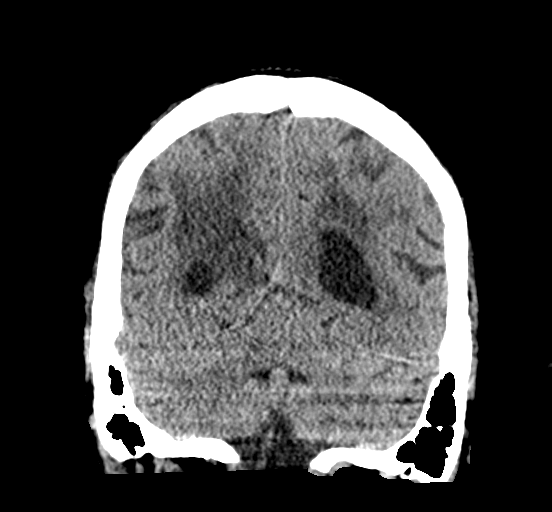
[im 34/76  brain]
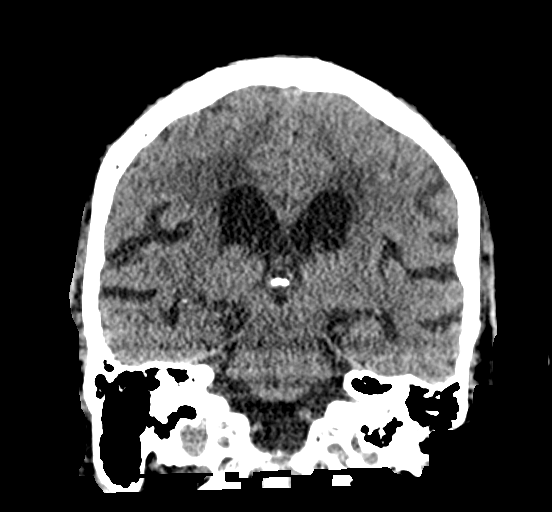
[im 42/76  brain]
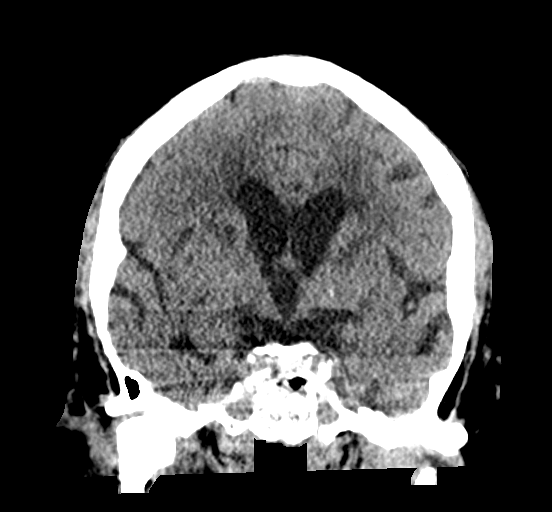

[Series 8: sagittal soft tissue · sagittal · 0.35mm/px · 3 of 65 slices shown]
[im 22/65  brain]
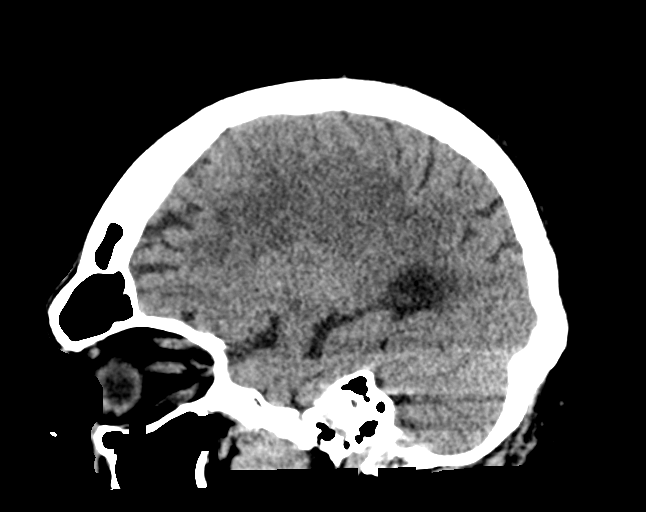
[im 33/65  brain]
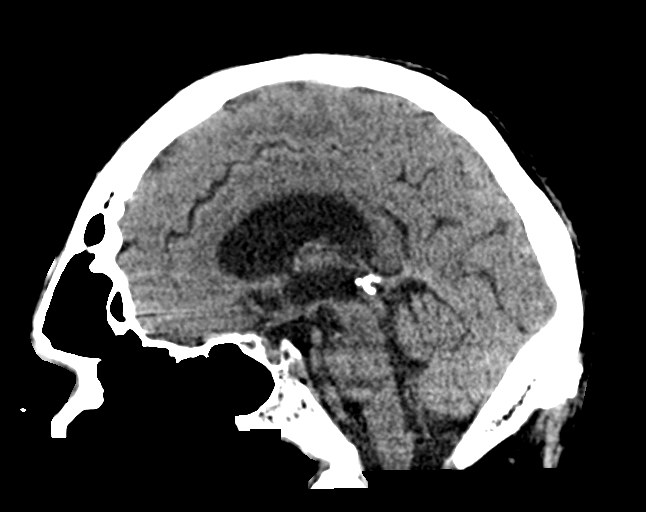
[im 43/65  brain]
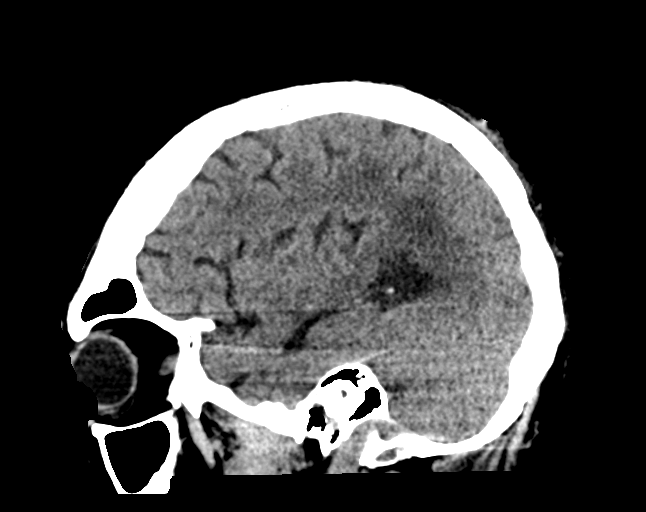

[Series 9: true axial · axial · 0.38mm/px · z∈[-167,-34]mm · 8 of 59 slices shown, 10 images]
[im 7/59  brain]
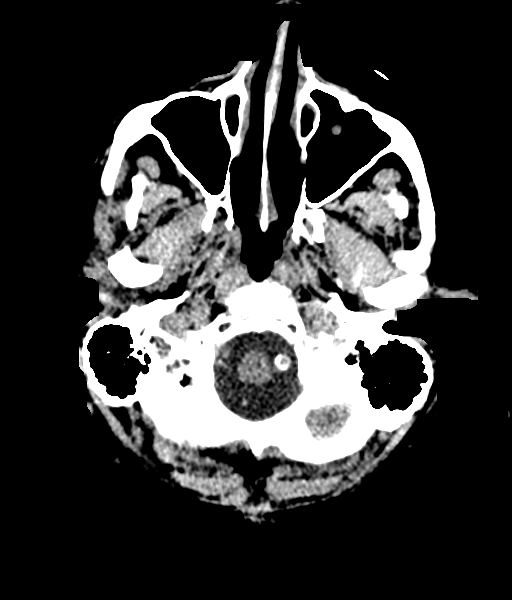
[im 7/59  bone]
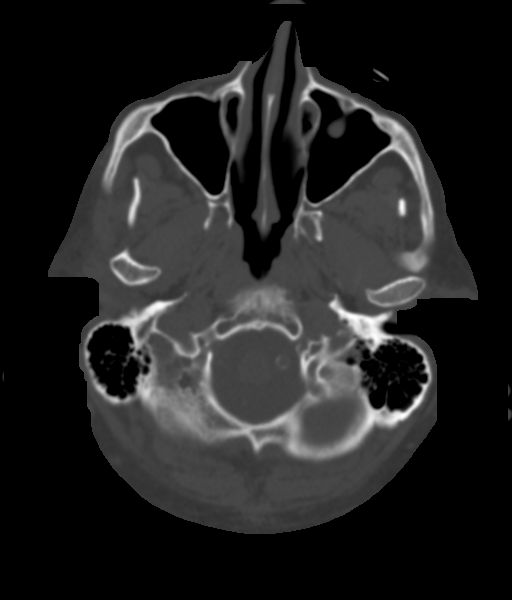
[im 13/59  brain]
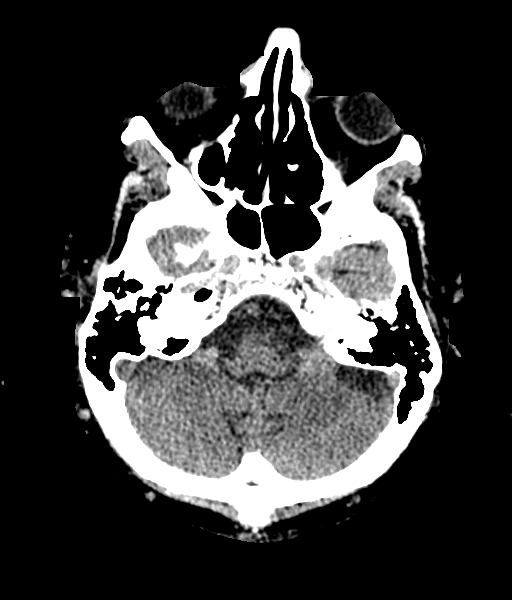
[im 20/59  brain]
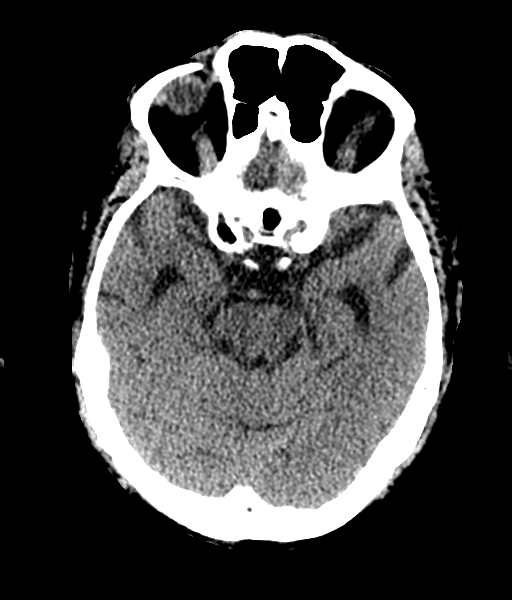
[im 26/59  brain]
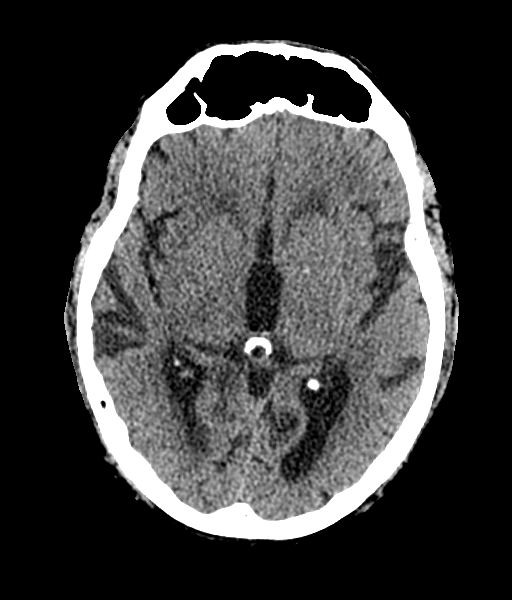
[im 33/59  brain]
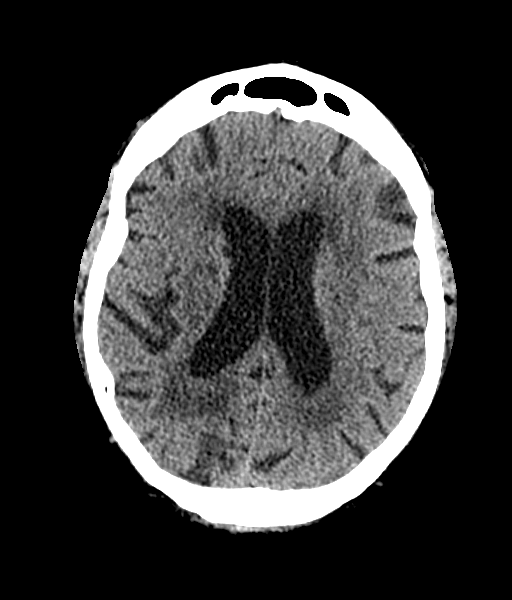
[im 33/59  bone]
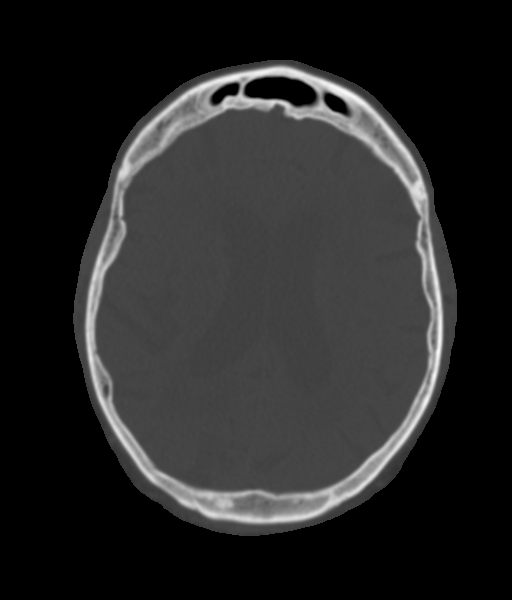
[im 39/59  brain]
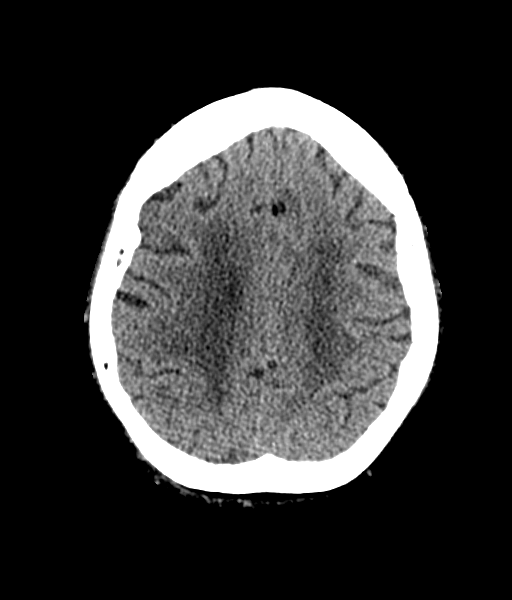
[im 46/59  brain]
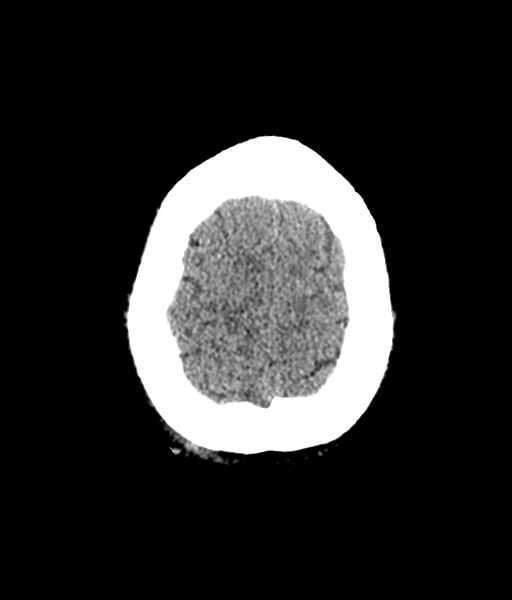
[im 52/59  brain]
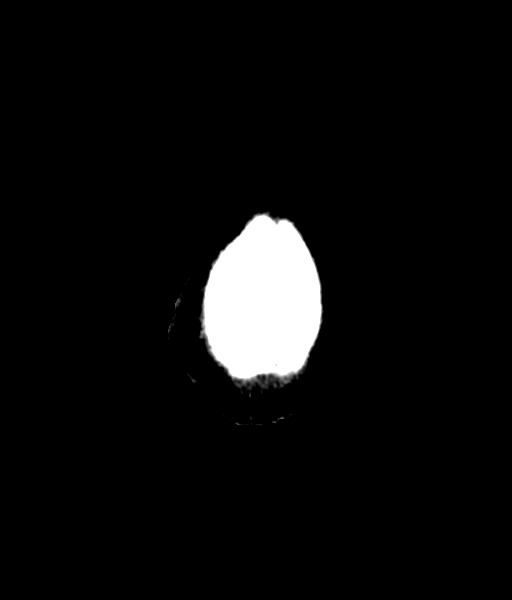

[14 of 47 positions shown; findings below may reference images not displayed]

FINDINGS: Brain: There is no mass, hemorrhage or extra-axial collection. There
is generalized atrophy without lobar predilection. Hypodensity of
the white matter is most commonly associated with chronic
microvascular disease. There are old small vessel infarcts of the
right basal ganglia.

Vascular: No abnormal hyperdensity of the major intracranial
arteries or dural venous sinuses. No intracranial atherosclerosis.

Skull: The visualized skull base, calvarium and extracranial soft
tissues are normal.

Sinuses/Orbits: No fluid levels or advanced mucosal thickening of
the visualized paranasal sinuses. No mastoid or middle ear effusion.
The orbits are normal.
IMPRESSION: 1. No acute intracranial abnormality.
2. Generalized atrophy and chronic microvascular ischemia.

## 2021-04-02 MED ORDER — MELATONIN 5 MG PO TABS
5.0000 mg | ORAL_TABLET | Freq: Once | ORAL | Status: AC
  Administered 2021-04-02: 5 mg via ORAL
  Filled 2021-04-02: qty 1

## 2021-04-02 MED ORDER — ACETAMINOPHEN 650 MG RE SUPP: 650.0000 mg | Freq: Four times a day (QID) | RECTAL | Status: DC | PRN

## 2021-04-02 MED ORDER — SODIUM CHLORIDE 0.9 % IV SOLN: INTRAVENOUS | Status: AC

## 2021-04-02 MED ORDER — SODIUM CHLORIDE 0.9 % IV SOLN
1.0000 g | Freq: Once | INTRAVENOUS | Status: AC
  Administered 2021-04-02: 1 g via INTRAVENOUS
  Filled 2021-04-02: qty 10

## 2021-04-02 MED ORDER — ONDANSETRON HCL 4 MG/2ML IJ SOLN
4.0000 mg | Freq: Four times a day (QID) | INTRAMUSCULAR | Status: DC | PRN
  Administered 2021-04-04 – 2021-04-06 (×2): 4 mg via INTRAVENOUS
  Filled 2021-04-02 (×2): qty 2

## 2021-04-02 MED ORDER — ACETAMINOPHEN 325 MG PO TABS
650.0000 mg | ORAL_TABLET | Freq: Four times a day (QID) | ORAL | Status: DC | PRN
  Administered 2021-04-02 – 2021-04-06 (×6): 650 mg via ORAL
  Filled 2021-04-02 (×7): qty 2

## 2021-04-02 MED ORDER — ENOXAPARIN SODIUM 40 MG/0.4ML IJ SOSY
40.0000 mg | PREFILLED_SYRINGE | INTRAMUSCULAR | Status: DC
  Administered 2021-04-02 – 2021-04-14 (×13): 40 mg via SUBCUTANEOUS
  Filled 2021-04-02 (×13): qty 0.4

## 2021-04-02 MED ORDER — ONDANSETRON HCL 4 MG PO TABS
4.0000 mg | ORAL_TABLET | Freq: Four times a day (QID) | ORAL | Status: DC | PRN
  Administered 2021-04-02 – 2021-04-03 (×3): 4 mg via ORAL
  Filled 2021-04-02 (×3): qty 1

## 2021-04-02 MED ORDER — AMLODIPINE BESYLATE 5 MG PO TABS
5.0000 mg | ORAL_TABLET | Freq: Every day | ORAL | Status: DC
  Administered 2021-04-02 – 2021-04-14 (×12): 5 mg via ORAL
  Filled 2021-04-02 (×13): qty 1

## 2021-04-02 MED ORDER — SODIUM CHLORIDE 0.9 % IV SOLN
1.0000 g | INTRAVENOUS | Status: DC
  Administered 2021-04-03 – 2021-04-08 (×6): 1 g via INTRAVENOUS
  Filled 2021-04-02 (×6): qty 10

## 2021-04-02 NOTE — Plan of Care (Signed)
  Problem: Clinical Measurements: Goal: Ability to maintain clinical measurements within normal limits will improve Outcome: Progressing   Problem: Coping: Goal: Level of anxiety will decrease Outcome: Progressing   Problem: Pain Managment: Goal: General experience of comfort will improve Outcome: Progressing   Problem: Safety: Goal: Ability to remain free from injury will improve Outcome: Progressing   

## 2021-04-02 NOTE — Progress Notes (Addendum)
Pt stable though confused and attempting to crawl of bed with bed alarm going off often this afternoon. Family reports on admission history earlier in shift that pt was not falling but having severe weakness at home. Wife reported she feels like she needs help for him at home. Physical therapy and occupational therapy eval ordered. Pt wife also reported concern with pt meds being reordered as well. She feels like he was on too much medication that caused weakness and diarrhea at home. She definitely feels like pt did much better when his statin drug was stopped. She wants to be notified and kept up to date when medications are added back to pt.

## 2021-04-02 NOTE — H&P (Signed)
History and Physical    Nicholas Mckenzie BEM:[id number] DOB: 04/29/1946 DOA: 04/01/2021  PCP: Chesley Noon, MD   Patient coming from: Home   Chief Complaint: Fatigue, lightheaded, N/V/D, pain with urination, confusion   HPI: Nicholas Mckenzie is a 75 y.o. male with medical history significant for hypertension, nephrolithiasis, OSA on CPAP, chronic renal insufficiency, and depression, now presenting to emergency department for evaluation of fatigue, lightheadedness, dysuria, confusion, nausea, vomiting, and loose stools.  Patient is accompanied by his wife who assists with the history.  He was discharged from the hospital 3 weeks ago after an accidental overdose on multiple prescription medications, was doing well initially, but over the past 1 to 2 weeks, has become more fatigued, developed nausea, poor appetite, nonbloody vomiting, loose stools, and has had pain with urination.  He has seemed confused intermittently for the past few days according to his wife.  He has not been drinking any alcohol since the recent admission, is also stopped his antihypertensives and statin.  Wife reports that his blood pressure has been well controlled off of the medications for the past couple weeks.  Patient denies headache, focal numbness or weakness, but acknowledges feeling confused at times, fatigued, and lightheaded, particularly on standing.  He had a right ureteral stent placed in [place] 2-1/2 years ago and was working with his PCP to get a referral to urology to have this addressed.  ED Course: Upon arrival to the ED, patient is found to be afebrile, saturating well on room air, and with stable blood pressure.  Chemistry panel notable for creatinine 1.63 and slight elevation in transaminases.  CBC with new leukocytosis to 12,000.  Lactic acid is normal.  Urinalysis with many bacteria, large leukocytes, greater than 50 WBC/hpf, ketonuria, and proteinuria.  Head CT is negative for acute intracranial abnormality.  CT  of the abdomen and pelvis demonstrates progressive severe right hydroureteronephrosis likely due to obstruction of right ureteral stent from 3.1 cm concretion at the distal margin within the bladder lumen.  Multiple right ureteral calculi are noted along the course of the ureteral stent on CT, measuring up to 1.1 cm and stable from prior study.  Patient was given a liter saline, acetaminophen, Rocephin, and Zofran in the ED.  Review of Systems:  All other systems reviewed and apart from HPI, are negative.  Past Medical History:  Diagnosis Date   Hypertension    Kidney stones    Sleep apnea     Past Surgical History:  Procedure Laterality Date   CATARACT EXTRACTION     kidney stent     MANDIBLE FRACTURE SURGERY      Social History:   reports that he has never smoked. He has never used smokeless tobacco. He reports current alcohol use. No history on file for drug use.  No Known Allergies  Family History  Problem Relation Age of Onset   Hypertension Mother    Hypertension Other      Prior to Admission medications   Medication Sig Start Date End Date Taking? Authorizing Provider  acetaminophen (TYLENOL) 325 MG tablet Take 2 tablets (650 mg total) by mouth every 6 (six) hours as needed for mild pain (or Fever >/= 101). 03/12/21   Eugenie Filler, MD  amLODipine (NORVASC) 10 MG tablet Take 1 tablet (10 mg total) by mouth daily. 03/12/21   Eugenie Filler, MD  amoxicillin-clavulanate (AUGMENTIN) 875-125 MG tablet Take 1 tablet by mouth every 12 (twelve) hours. 03/12/21   Eugenie Filler,  MD  FLUoxetine (PROZAC) 20 MG capsule Take 1 capsule by mouth daily. 06/29/20   [provider]  hydrALAZINE (APRESOLINE) 25 MG tablet Take 1 tablet (25 mg total) by mouth every 8 (eight) hours. 03/12/21   Eugenie Filler, MD  Hydrocortisone, Perianal, 1 % CREA Place 1 application rectally daily as needed for pain. 03/19/19   [provider]  Multiple Vitamin (MULTIVITAMIN ADULT  PO) Take 1 tablet by mouth daily.    [provider]  OVER THE COUNTER MEDICATION Take 1 tablet by mouth daily. Magnesium + Potassium + Bromelain supplement by [facility]    [provider]  OVER THE COUNTER MEDICATION Take 1 tablet by mouth at bedtime. Gennette Pac for sleep.    [provider]  rosuvastatin (CRESTOR) 10 MG tablet Take 10 mg by mouth at bedtime. 01/15/21   [provider]  sildenafil (REVATIO) 20 MG tablet Take 4 tablets by mouth daily as needed (ED). 01/11/21   [provider]  telmisartan (MICARDIS) 80 MG tablet Take 1 tablet by mouth daily. 02/24/21   [provider]  temazepam (RESTORIL) 30 MG capsule Take 30 mg by mouth at bedtime as needed for sleep. 03/10/21   [provider]    Physical Exam: Vitals:   04/01/21 2345 04/02/21 0045 04/02/21 0145 04/02/21 0224  BP: 139/83 (!) 146/95 130/78 (!) 108/94  Pulse: 70 74 69 67  Resp: '18 18 18 18  ' Temp:    97.7 F (36.5 C)  TempSrc:    Oral  SpO2: 97% 94% 98% 96%  Weight:        Constitutional: NAD, calm  Eyes: PERTLA, lids and conjunctivae normal ENMT: Mucous membranes are moist. Posterior pharynx clear of any exudate or lesions.   Neck:  supple, no masses  Respiratory: no wheezing, no crackles. No accessory muscle use.  Cardiovascular: S1 & S2 heard, regular rate and rhythm. No extremity edema.  Abdomen: No distension, no tenderness, soft. Bowel sounds active.  Musculoskeletal: no clubbing / cyanosis. No joint deformity upper and lower extremities.   Skin: no significant rashes, lesions, ulcers. Warm, dry, well-perfused. Neurologic: CN 2-12 grossly intact. Sensation intact. Strength 5/5 in all 4 limbs. Somnolent, wakes to voice. Slow to respond initially but oriented to person, place, and situation.  Psychiatric: Pleasant. Cooperative.    Labs and Imaging on Admission: I have personally reviewed following labs and imaging studies  CBC: Recent Labs   Lab 04/01/21 2255  WBC 12.0*  NEUTROABS 10.6*  HGB 11.8*  HCT 37.0*  MCV 89.8  PLT 827   Basic Metabolic Panel: Recent Labs  Lab 04/01/21 2255  NA 143  K 3.5  CL 111  CO2 22  GLUCOSE 134*  BUN 39*  CREATININE 1.63*  CALCIUM 8.7*   GFR: Estimated Creatinine Clearance: 45.9 mL/min (A) (by C-G formula based on SCr of 1.63 mg/dL (H)). Liver Function Tests: Recent Labs  Lab 04/01/21 2255  AST 42*  ALT 46*  ALKPHOS 75  BILITOT 0.8  PROT 6.2*  ALBUMIN 2.2*   Recent Labs  Lab 04/01/21 2255  LIPASE 40   No results for input(s): AMMONIA in the last 168 hours. Coagulation Profile: No results for input(s): INR, PROTIME in the last 168 hours. Cardiac Enzymes: No results for input(s): CKTOTAL, CKMB, CKMBINDEX, TROPONINI in the last 168 hours. BNP (last 3 results) No results for input(s): PROBNP in the last 8760 hours. HbA1C: No results for input(s): HGBA1C in the last 72 hours. CBG:  No results for input(s): GLUCAP in the last 168 hours. Lipid Profile: No results for input(s): CHOL, HDL, LDLCALC, TRIG, CHOLHDL, LDLDIRECT in the last 72 hours. Thyroid Function Tests: No results for input(s): TSH, T4TOTAL, FREET4, T3FREE, THYROIDAB in the last 72 hours. Anemia Panel: No results for input(s): VITAMINB12, FOLATE, FERRITIN, TIBC, IRON, RETICCTPCT in the last 72 hours. Urine analysis:    Component Value Date/Time   COLORURINE YELLOW 04/02/2021 0230   APPEARANCEUR HAZY (A) 04/02/2021 0230   LABSPEC 1.016 04/02/2021 0230   PHURINE 5.0 04/02/2021 0230   GLUCOSEU NEGATIVE 04/02/2021 0230   HGBUR NEGATIVE 04/02/2021 0230   BILIRUBINUR NEGATIVE 04/02/2021 0230   KETONESUR 5 (A) 04/02/2021 0230   PROTEINUR 30 (A) 04/02/2021 0230   NITRITE NEGATIVE 04/02/2021 0230   LEUKOCYTESUR LARGE (A) 04/02/2021 0230   Sepsis Labs: '@LABRCNTIP' (procalcitonin:4,lacticidven:4) ) Recent Results (from the past 240 hour(s))  Resp Panel by RT-PCR (Flu A&B, Covid) Nasopharyngeal Swab      Status: None   Collection Time: 04/01/21  8:00 PM   Specimen: Nasopharyngeal Swab; Nasopharyngeal(NP) swabs in vial transport medium  Result Value Ref Range Status   SARS Coronavirus 2 by RT PCR NEGATIVE NEGATIVE Final    Comment: (NOTE) SARS-CoV-2 target nucleic acids are NOT DETECTED.  The SARS-CoV-2 RNA is generally detectable in upper respiratory specimens during the acute phase of infection. The lowest concentration of SARS-CoV-2 viral copies this assay can detect is 138 copies/mL. A negative result does not preclude SARS-Cov-2 infection and should not be used as the sole basis for treatment or other patient management decisions. A negative result may occur with  improper specimen collection/handling, submission of specimen other than nasopharyngeal swab, presence of viral mutation(s) within the areas targeted by this assay, and inadequate number of viral copies(<138 copies/mL). A negative result must be combined with clinical observations, patient history, and epidemiological information. The expected result is Negative.  Fact Sheet for Patients:  EntrepreneurPulse.com.au  Fact Sheet for Healthcare Providers:  IncredibleEmployment.be  This test is no t yet approved or cleared by the [place] FDA and  has been authorized for detection and/or diagnosis of SARS-CoV-2 by FDA under an Emergency Use Authorization (EUA). This EUA will remain  in effect (meaning this test can be used) for the duration of the COVID-19 declaration under Section 564(b)(1) of the Act, 21 U.S.C.section 360bbb-3(b)(1), unless the authorization is terminated  or revoked sooner.       Influenza A by PCR NEGATIVE NEGATIVE Final   Influenza B by PCR NEGATIVE NEGATIVE Final    Comment: (NOTE) The Xpert Xpress SARS-CoV-2/FLU/RSV plus assay is intended as an aid in the diagnosis of influenza from Nasopharyngeal swab specimens and should not be used as a sole basis  for treatment. Nasal washings and aspirates are unacceptable for Xpert Xpress SARS-CoV-2/FLU/RSV testing.  Fact Sheet for Patients: EntrepreneurPulse.com.au  Fact Sheet for Healthcare Providers: IncredibleEmployment.be  This test is not yet approved or cleared by the [place] FDA and has been authorized for detection and/or diagnosis of SARS-CoV-2 by FDA under an Emergency Use Authorization (EUA). This EUA will remain in effect (meaning this test can be used) for the duration of the COVID-19 declaration under Section 564(b)(1) of the Act, 21 U.S.C. section 360bbb-3(b)(1), unless the authorization is terminated or revoked.  Performed at [facility], [place] [address], [name], [place] [address]      Radiological Exams on Admission: CT ABDOMEN PELVIS WO CONTRAST  Result Date: 04/01/2021 CLINICAL DATA:  Diarrhea,  weakness, dizziness for 2 weeks EXAM: CT ABDOMEN AND PELVIS WITHOUT CONTRAST TECHNIQUE: Multidetector CT imaging of the abdomen and pelvis was performed following the standard protocol without IV contrast. Unenhanced CT was performed per clinician order. Lack of IV contrast limits sensitivity and specificity, especially for evaluation of abdominal/pelvic solid viscera. COMPARISON:  02/10/2021 FINDINGS: Lower chest: No acute pleural or parenchymal lung disease. Small hiatal hernia. Hepatobiliary: No focal liver abnormality is seen. No gallstones, gallbladder wall thickening, or biliary dilatation. Pancreas: Unremarkable. No pancreatic ductal dilatation or surrounding inflammatory changes. Spleen: Normal in size without focal abnormality. Adrenals/Urinary Tract: There is progressive severe right-sided obstructive uropathy with worsening hydronephrosis and hydroureter. Nonobstructing right renal calculus measuring 1 cm again noted. An indwelling right ureteral stent is coiled within the dilated right renal pelvis, and extends into  the bladder lumen. A large concretion surrounds the distal aspect of the stent within the bladder lumen, measuring 3.1 cm, likely resulting in stent malfunction. There are multiple right ureteral calculi along the course of the right ureteral stent, largest measuring 1.1 cm. Stable small caliceal calculi within the upper pole left kidney measuring up to 7 mm. No left-sided obstruction. The bladder is decompressed, limiting its evaluation. The adrenals are unremarkable. Stomach/Bowel: No bowel obstruction or ileus. Extensive diverticulosis of the distal colon without diverticulitis. No bowel wall thickening or inflammatory change. Vascular/Lymphatic: Aortic atherosclerosis. No enlarged abdominal or pelvic lymph nodes. Reproductive: Prostate is unremarkable. Other: No free fluid or free intraperitoneal gas. Stable fat containing right inguinal hernia. No bowel herniation. Musculoskeletal: No acute or destructive bony lesions. Reconstructed images demonstrate no additional findings. IMPRESSION: 1. Progressive severe right-sided hydronephrosis and hydroureter, likely due to malfunction of the indwelling right ureteral stent. A 3.1 cm concretion surrounding the distal margin of the right ureteral stent within the bladder lumen likely results in stent obstruction. 2. Bilateral nonobstructing renal calculi. 3. Multiple right ureteral calculi are seen along the course of the ureteral stent, largest measuring 1.1 cm. These are stable. 4. Distal colonic diverticulosis without diverticulitis. 5. Small hiatal hernia. 6.  Choose 1 Electronically Signed   By: Randa Ngo M.D.   On: 04/01/2021 22:06   CT HEAD WO CONTRAST (5MM)  Result Date: 04/02/2021 CLINICAL DATA:  Delirium EXAM: CT HEAD WITHOUT CONTRAST TECHNIQUE: Contiguous axial images were obtained from the base of the skull through the vertex without intravenous contrast. COMPARISON:  None. FINDINGS: Brain: There is no mass, hemorrhage or extra-axial collection.  There is generalized atrophy without lobar predilection. Hypodensity of the white matter is most commonly associated with chronic microvascular disease. 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 Vascular: No abnormal hyperdensity of the major intracranial arteries or dural venous sinuses. No intracranial atherosclerosis. Skull: The visualized skull base, calvarium and extracranial soft tissues are normal. Sinuses/Orbits: No fluid levels or advanced mucosal thickening of the visualized paranasal sinuses. No mastoid or middle ear effusion. The orbits are normal. IMPRESSION: 1. No acute intracranial abnormality. 2. Generalized atrophy and chronic microvascular ischemia. Electronically Signed   By: Ulyses Jarred M.D.   On: 04/02/2021 01:22    Assessment/Plan   1. Acute encephalopathy  - Presents with ~10 days poor appetite, fatigue, N/V, loose stools, dysuria, and now confusion  - No focal neuro deficits or acute abnormality on head CT - Likely related to dehydration and UTI  - Continue IVF hydration and empiric antibiotics, expand workup if he does not improve as expected with this    2. UTI;  nephrolithiasis; obstructed right ureteral stent  - Presents with confusion, fatigue, and dysuria  - UA compatible with infection and there is new leukocytosis but no fever, lactic acidosis, or hemodynamic instability  - CT with severe Rt hydroureteronephrosis and atrophic parenchyma, likely chronic and secondary to obstruction of stent placed almost 3 yrs ago per family - Rocephin started in ED  - Culture urine, continue Rocephin, discuss with urology in am    3. CKD IIIa  - SCr is 1.63 on admission, up from last month in setting of hypovolemia  - Continue IVF hydration, renally-dose medications, monitor    4. OSA  - Continue CPAP qHS    5. Hypertension  - BP well controlled at home off of antihypertensives for the past couple weeks  - Treat as-needed only for now    6. Depression   - Hold Prozac initially in light of somnolence and diarrhea   7. N/V/D  - Abd exam benign and no bowel wall thickening or inflammatory change on CT  - Denies N/V/D since arrival in ED 8 hrs ago  - Continue IVF hydration, monitor electrolytes    8. Elevated transaminases  - Transaminases slightly elevated with normal alk phos and bilirubin  - No acute hepatobiliary abnormality on CT in ED  - Hold statin initially and trend    DVT prophylaxis: Lovenox  Code Status: Full  Level of Care: Level of care: Med-Surg Family Communication: Wife updated at bedside  Disposition Plan: Patient is from: Home  Anticipated d/c is to: Home  Anticipated d/c date is: 10/11 or 04/05/21  Patient currently: pending improvement in mental status  Consults called: None  Admission status: Inpatient     Vianne Bulls, MD [facility]  04/02/2021, 3:45 AM

## 2021-04-02 NOTE — ED Notes (Signed)
In and Out catheter attempted to obtain U/A. There was no urine output. PA notified

## 2021-04-02 NOTE — Progress Notes (Signed)
  PROGRESS NOTE    Nicholas Mckenzie  HYW:[id number] DOB: 1945-08-04 DOA: 04/01/2021  PCP: Nicholas Inch, MD    LOS - 0    Patient admitted after midnight with progressive fatigue, dysuria, confusion, N/V and loose stools and poor appetite.  UA obtained in the ED consistent with infection.  CT abdomen/pelvis with progressive severe right hydroureteronephrosis likely due to obstruction of right ureteral stent from 3.1 cm concretion at the distal margin within the bladder lumen.  Multiple right ureteral calculi are noted along the course of the ureteral stent on CT, measuring up to 1.1 cm and stable from prior study.    Interval subjective: Pt seen in ED holding for a bed. Wearing CPAP.  Reports feeling a little better since presenting yesterday evening.  No acute complaints.  Exam: awake, NAD HEENT: CPAP mask on, hearing grossly normal Lungs: clear bilaterally Heart: RRR, no peripheral edema Abdomen soft and non-tender Neuro: no gross focal deficits, normal speech.   Principal Problem:   Acute encephalopathy Active Problems:   CKD (chronic kidney disease) stage 3a   Hypertension   Sleep apnea   Major depressive disorder, recurrent episode, in partial remission (HCC)   Ureteral stent occlusion, sequela   Elevated transaminase level    I have reviewed the full H&P by Dr. Antionette Char in detail, and I agree with the assessment and plan as outlined therein. In addition: --Urology consulted and case discussed with Dr. Annabell Howells --Follow urology recommendations --Continue empiric antibiotics --Follow urine culture --Will need outpatient urology follow up   No Charge    [name], DO [facility]   If 7PM-7AM, please contact night-coverage [email or web address] 04/02/2021, 7:48 AM

## 2021-04-02 NOTE — Progress Notes (Signed)
Md notified that pt would like to be a DNR. Code status was discussed with family and pt. Wife and pt understood what DNR meant. Rn will continue to monitor.

## 2021-04-02 NOTE — ED Provider Notes (Signed)
Patient signed out to me at shift change.  Here with some confusion, low-grade temperature, mildly elevated white blood cell count.  Suspicious of urinary source.  Wife is at the bedside, she states that the patient has seemed confused to her.  I will start some Rocephin to cover urinary source.  Case was discussed with Dr. Antionette Char, who is appreciated for admitting the patient.   Roxy Horseman, PA-C 04/02/21 0236    Shon Baton, MD 04/02/21 [phone]

## 2021-04-02 NOTE — Consult Note (Signed)
Subjective: 1. Hydroureteronephrosis   2. AKI (acute kidney injury) [facility])      Consult requested by Dr. Esaw Grandchild.  I was asked to see Nicholas Mckenzie for the finding of a retained right ureteral stent with massive hydro and a large bladder stone with possible infection and acute encephalopathy.   He had a bout of severe nausea and vomiting over the last few days and presented with confusion but he is alert and orient at this time.   He has chronic frequency and urgency with nocturia.  He voids every 1 hour and is voiding well with a condom cath in place.  He was found to have an infected looking urine and a WBC of 12 and a Cr of 1.63 which is up from his baseline of 1.12.  The lactic acid was 1.3.  He was given rocephin with improvement in his labs and he is afebrile with a prior tmax of 100.2.  He has no hypotension.  He had a CT AP that shows a right ureteral stent with encrustation particularly of the distal loop that is 3.1cm.  The hydro was reported as progressive since a CT on 02/10/21 but the degree of atrophy is unchanged and the encrustation is stable.  He reports a history of right renal obstruction 20 years ago and reports having a nephrostomy tube at some point.  He had the stent placed in [place] about 3 years ago.  He was seen on 02/10/21 at [facility] ER and was referred to our office but he has [facility] Medicare which we don't participate with so a referral was initiated to [facility].   ROS:  Review of Systems  Constitutional:  Negative for chills and fever.  Gastrointestinal:  Negative for abdominal pain.  Genitourinary:  Positive for frequency and urgency. Negative for flank pain and hematuria.  All other systems reviewed and are negative.  No Known Allergies  Past Medical History:  Diagnosis Date   CKD (chronic kidney disease) stage 3, GFR 30-59 ml/min (HCC)    Hypertension    Kidney stones    Sleep apnea     Past Surgical History:  Procedure Laterality Date   CATARACT  EXTRACTION     kidney stent     MANDIBLE FRACTURE SURGERY      Social History   Socioeconomic History   Marital status: Married    Spouse name: Not on file   Number of children: Not on file   Years of education: Not on file   Highest education level: Not on file  Occupational History   Not on file  Tobacco Use   Smoking status: Never   Smokeless tobacco: Never  Substance and Sexual Activity   Alcohol use: Yes    Comment: Socially; twice a week   Drug use: Not on file   Sexual activity: Not on file  Other Topics Concern   Not on file  Social History Narrative   Not on file   Social Determinants of Health   Financial Resource Strain: Not on file  Food Insecurity: Not on file  Transportation Needs: Not on file  Physical Activity: Not on file  Stress: Not on file  Social Connections: Not on file  Intimate Partner Violence: Not on file    Family History  Problem Relation Age of Onset   Hypertension Mother    Hypertension Other     Anti-infectives: Anti-infectives (From admission, onward)    Start     Dose/Rate Route Frequency Ordered Stop  04/03/21 0215  cefTRIAXone (ROCEPHIN) 1 g in sodium chloride 0.9 % 100 mL IVPB        1 g 200 mL/hr over 30 Minutes Intravenous Every 24 hours 04/02/21 0344     04/02/21 0215  cefTRIAXone (ROCEPHIN) 1 g in sodium chloride 0.9 % 100 mL IVPB        1 g 200 mL/hr over 30 Minutes Intravenous  Once 04/02/21 0213 04/02/21 0251       Current Facility-Administered Medications  Medication Dose Route Frequency Provider Last Rate Last Admin   0.9 %  sodium chloride infusion   Intravenous Continuous Opyd, Lavone Neri, MD 100 mL/hr at 04/02/21 0646 New Bag at 04/02/21 0646   acetaminophen (TYLENOL) tablet 650 mg  650 mg Oral Q6H PRN Opyd, Lavone Neri, MD       Or   acetaminophen (TYLENOL) suppository 650 mg  650 mg Rectal Q6H PRN Opyd, Lavone Neri, MD       [START ON 04/03/2021] cefTRIAXone (ROCEPHIN) 1 g in sodium chloride 0.9 % 100 mL  IVPB  1 g Intravenous Q24H Opyd, Lavone Neri, MD       enoxaparin (LOVENOX) injection 40 mg  40 mg Subcutaneous Q24H Opyd, Lavone Neri, MD   40 mg at 04/02/21 0921   ondansetron (ZOFRAN) tablet 4 mg  4 mg Oral Q6H PRN Opyd, Lavone Neri, MD       Or   ondansetron (ZOFRAN) injection 4 mg  4 mg Intravenous Q6H PRN Opyd, Lavone Neri, MD       Current Outpatient Medications  Medication Sig Dispense Refill   acetaminophen (TYLENOL) 325 MG tablet Take 2 tablets (650 mg total) by mouth every 6 (six) hours as needed for mild pain (or Fever >/= 101). (Patient not taking: No sig reported)     amLODipine (NORVASC) 10 MG tablet Take 1 tablet (10 mg total) by mouth daily. (Patient not taking: No sig reported)     amoxicillin-clavulanate (AUGMENTIN) 875-125 MG tablet Take 1 tablet by mouth every 12 (twelve) hours. (Patient not taking: No sig reported)     hydrALAZINE (APRESOLINE) 25 MG tablet Take 1 tablet (25 mg total) by mouth every 8 (eight) hours. (Patient not taking: No sig reported) 90 tablet 0     Objective: Vital signs in last 24 hours: BP (!) 134/94   Pulse 72   Temp 97.7 F (36.5 C) (Oral)   Resp 18   Wt 98.4 kg   SpO2 100%   BMI 32.99 kg/m   Intake/Output from previous day: 10/08 0701 - 10/09 0700 In: 1099 [IV Piggyback:1099] Out: -  Intake/Output this shift: No intake/output data recorded.   Physical Exam Vitals reviewed.  Constitutional:      Appearance: Normal appearance.  HENT:     Head: Normocephalic and atraumatic.  Cardiovascular:     Rate and Rhythm: Normal rate and regular rhythm.     Heart sounds: Normal heart sounds.  Pulmonary:     Effort: Pulmonary effort is normal. No respiratory distress.     Breath sounds: Normal breath sounds.  Abdominal:     General: There is distension.     Palpations: Abdomen is soft. There is no mass (firm on right).     Tenderness: There is no abdominal tenderness.  Genitourinary:    Comments: Normal phallus with a condom cath in place.    Musculoskeletal:        General: No swelling or tenderness. Normal range of motion.  Skin:  General: Skin is warm and dry.  Neurological:     General: No focal deficit present.     Mental Status: He is alert and oriented to person, place, and time.  Psychiatric:        Mood and Affect: Mood normal.        Behavior: Behavior normal.    Lab Results:  Results for orders placed or performed during the hospital encounter of 04/01/21 (from the past 24 hour(s))  Resp Panel by RT-PCR (Flu A&B, Covid) Nasopharyngeal Swab     Status: None   Collection Time: 04/01/21  8:00 PM   Specimen: Nasopharyngeal Swab; Nasopharyngeal(NP) swabs in vial transport medium  Result Value Ref Range   SARS Coronavirus 2 by RT PCR NEGATIVE NEGATIVE   Influenza A by PCR NEGATIVE NEGATIVE   Influenza B by PCR NEGATIVE NEGATIVE  CBC with Differential     Status: Abnormal   Collection Time: 04/01/21 10:55 PM  Result Value Ref Range   WBC 12.0 (H) 4.0 - 10.5 K/uL   RBC 4.12 (L) 4.22 - 5.81 MIL/uL   Hemoglobin 11.8 (L) 13.0 - 17.0 g/dL   HCT 29.5 (L) 18.8 - 41.6 %   MCV 89.8 80.0 - 100.0 fL   MCH 28.6 26.0 - 34.0 pg   MCHC 31.9 30.0 - 36.0 g/dL   RDW 60.6 30.1 - 60.1 %   Platelets 340 150 - 400 K/uL   nRBC 0.0 0.0 - 0.2 %   Neutrophils Relative % 89 %   Neutro Abs 10.6 (H) 1.7 - 7.7 K/uL   Lymphocytes Relative 3 %   Lymphs Abs 0.4 (L) 0.7 - 4.0 K/uL   Monocytes Relative 6 %   Monocytes Absolute 0.7 0.1 - 1.0 K/uL   Eosinophils Relative 0 %   Eosinophils Absolute 0.0 0.0 - 0.5 K/uL   Basophils Relative 0 %   Basophils Absolute 0.0 0.0 - 0.1 K/uL   Immature Granulocytes 2 %   Abs Immature Granulocytes 0.28 (H) 0.00 - 0.07 K/uL  Comprehensive metabolic panel     Status: Abnormal   Collection Time: 04/01/21 10:55 PM  Result Value Ref Range   Sodium 143 135 - 145 mmol/L   Potassium 3.5 3.5 - 5.1 mmol/L   Chloride 111 98 - 111 mmol/L   CO2 22 22 - 32 mmol/L   Glucose, Bld 134 (H) 70 - 99 mg/dL   BUN  39 (H) 8 - 23 mg/dL   Creatinine, Ser 0.93 (H) 0.61 - 1.24 mg/dL   Calcium 8.7 (L) 8.9 - 10.3 mg/dL   Total Protein 6.2 (L) 6.5 - 8.1 g/dL   Albumin 2.2 (L) 3.5 - 5.0 g/dL   AST 42 (H) 15 - 41 U/L   ALT 46 (H) 0 - 44 U/L   Alkaline Phosphatase 75 38 - 126 U/L   Total Bilirubin 0.8 0.3 - 1.2 mg/dL   GFR, Estimated 44 (L) >60 mL/min   Anion gap 10 5 - 15  Lipase, blood     Status: None   Collection Time: 04/01/21 10:55 PM  Result Value Ref Range   Lipase 40 11 - 51 U/L  Lactic acid, plasma     Status: None   Collection Time: 04/01/21 10:55 PM  Result Value Ref Range   Lactic Acid, Venous 1.3 0.5 - 1.9 mmol/L  Lactic acid, plasma     Status: None   Collection Time: 04/02/21  2:00 AM  Result Value Ref Range   Lactic Acid, Venous  1.0 0.5 - 1.9 mmol/L  Urinalysis, Routine w reflex microscopic Urine, Clean Catch     Status: Abnormal   Collection Time: 04/02/21  2:30 AM  Result Value Ref Range   Color, Urine YELLOW YELLOW   APPearance HAZY (A) CLEAR   Specific Gravity, Urine 1.016 1.005 - 1.030   pH 5.0 5.0 - 8.0   Glucose, UA NEGATIVE NEGATIVE mg/dL   Hgb urine dipstick NEGATIVE NEGATIVE   Bilirubin Urine NEGATIVE NEGATIVE   Ketones, ur 5 (A) NEGATIVE mg/dL   Protein, ur 30 (A) NEGATIVE mg/dL   Nitrite NEGATIVE NEGATIVE   Leukocytes,Ua LARGE (A) NEGATIVE   RBC / HPF 0-5 0 - 5 RBC/hpf   WBC, UA >50 (H) 0 - 5 WBC/hpf   Bacteria, UA MANY (A) NONE SEEN   Squamous Epithelial / LPF 0-5 0 - 5   WBC Clumps PRESENT    Mucus PRESENT    Amorphous Crystal PRESENT   Comprehensive metabolic panel     Status: Abnormal   Collection Time: 04/02/21  5:59 AM  Result Value Ref Range   Sodium 144 135 - 145 mmol/L   Potassium 3.6 3.5 - 5.1 mmol/L   Chloride 107 98 - 111 mmol/L   CO2 24 22 - 32 mmol/L   Glucose, Bld 117 (H) 70 - 99 mg/dL   BUN 37 (H) 8 - 23 mg/dL   Creatinine, Ser 2.19 (H) 0.61 - 1.24 mg/dL   Calcium 9.2 8.9 - 75.8 mg/dL   Total Protein 6.2 (L) 6.5 - 8.1 g/dL   Albumin 2.3  (L) 3.5 - 5.0 g/dL   AST 32 15 - 41 U/L   ALT 41 0 - 44 U/L   Alkaline Phosphatase 74 38 - 126 U/L   Total Bilirubin 0.6 0.3 - 1.2 mg/dL   GFR, Estimated 46 (L) >60 mL/min   Anion gap 13 5 - 15  CBC     Status: Abnormal   Collection Time: 04/02/21  5:59 AM  Result Value Ref Range   WBC 8.7 4.0 - 10.5 K/uL   RBC 4.08 (L) 4.22 - 5.81 MIL/uL   Hemoglobin 11.7 (L) 13.0 - 17.0 g/dL   HCT 83.2 (L) 54.9 - 82.6 %   MCV 88.7 80.0 - 100.0 fL   MCH 28.7 26.0 - 34.0 pg   MCHC 32.3 30.0 - 36.0 g/dL   RDW 41.5 (H) 83.0 - 94.0 %   Platelets 403 (H) 150 - 400 K/uL   nRBC 0.0 0.0 - 0.2 %    BMET Recent Labs    04/01/21 2255 04/02/21 0559  NA 143 144  K 3.5 3.6  CL 111 107  CO2 22 24  GLUCOSE 134* 117*  BUN 39* 37*  CREATININE 1.63* 1.59*  CALCIUM 8.7* 9.2   PT/INR No results for input(s): LABPROT, INR in the last 72 hours. ABG No results for input(s): PHART, HCO3 in the last 72 hours.  Invalid input(s): PCO2, PO2  Studies/Results: CT ABDOMEN PELVIS WO CONTRAST  Result Date: 04/01/2021 CLINICAL DATA:  Diarrhea, weakness, dizziness for 2 weeks EXAM: CT ABDOMEN AND PELVIS WITHOUT CONTRAST TECHNIQUE: Multidetector CT imaging of the abdomen and pelvis was performed following the standard protocol without IV contrast. Unenhanced CT was performed per clinician order. Lack of IV contrast limits sensitivity and specificity, especially for evaluation of abdominal/pelvic solid viscera. COMPARISON:  02/10/2021 FINDINGS: Lower chest: No acute pleural or parenchymal lung disease. Small hiatal hernia. Hepatobiliary: No focal liver abnormality is seen. No gallstones, gallbladder  wall thickening, or biliary dilatation. Pancreas: Unremarkable. No pancreatic ductal dilatation or surrounding inflammatory changes. Spleen: Normal in size without focal abnormality. Adrenals/Urinary Tract: There is progressive severe right-sided obstructive uropathy with worsening hydronephrosis and hydroureter. Nonobstructing  right renal calculus measuring 1 cm again noted. An indwelling right ureteral stent is coiled within the dilated right renal pelvis, and extends into the bladder lumen. A large concretion surrounds the distal aspect of the stent within the bladder lumen, measuring 3.1 cm, likely resulting in stent malfunction. There are multiple right ureteral calculi along the course of the right ureteral stent, largest measuring 1.1 cm. Stable small caliceal calculi within the upper pole left kidney measuring up to 7 mm. No left-sided obstruction. The bladder is decompressed, limiting its evaluation. The adrenals are unremarkable. Stomach/Bowel: No bowel obstruction or ileus. Extensive diverticulosis of the distal colon without diverticulitis. No bowel wall thickening or inflammatory change. Vascular/Lymphatic: Aortic atherosclerosis. No enlarged abdominal or pelvic lymph nodes. Reproductive: Prostate is unremarkable. Other: No free fluid or free intraperitoneal gas. Stable fat containing right inguinal hernia. No bowel herniation. Musculoskeletal: No acute or destructive bony lesions. Reconstructed images demonstrate no additional findings. IMPRESSION: 1. Progressive severe right-sided hydronephrosis and hydroureter, likely due to malfunction of the indwelling right ureteral stent. A 3.1 cm concretion surrounding the distal margin of the right ureteral stent within the bladder lumen likely results in stent obstruction. 2. Bilateral nonobstructing renal calculi. 3. Multiple right ureteral calculi are seen along the course of the ureteral stent, largest measuring 1.1 cm. These are stable. 4. Distal colonic diverticulosis without diverticulitis. 5. Small hiatal hernia. 6.  Choose 1 Electronically Signed   By: Sharlet Salina M.D.   On: 04/01/2021 22:06   CT HEAD WO CONTRAST ( )  Result Date: 04/02/2021 CLINICAL DATA:  Delirium EXAM: CT HEAD WITHOUT CONTRAST TECHNIQUE: Contiguous axial images were obtained from the base of the  skull through the vertex without intravenous contrast. COMPARISON:  None. FINDINGS: Brain: There is no mass, hemorrhage or extra-axial collection. There is generalized atrophy without lobar predilection. Hypodensity of the white matter is most commonly associated with chronic microvascular disease. 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 Vascular: No abnormal hyperdensity of the major intracranial arteries or dural venous sinuses. No intracranial atherosclerosis. Skull: The visualized skull base, calvarium and extracranial soft tissues are normal. Sinuses/Orbits: No fluid levels or advanced mucosal thickening of the visualized paranasal sinuses. No mastoid or middle ear effusion. The orbits are normal. IMPRESSION: 1. No acute intracranial abnormality. 2. Generalized atrophy and chronic microvascular ischemia. Electronically Signed   By: Deatra Robinson M.D.   On: 04/02/2021 01:22     Assessment/Plan: Mental status change with encephalopathy.   I don't think his issues are related to urosepsis at this time.  His urine is abnormal because of the bladder stone and antibiotics are appropriate pending the culture, but I don't think acute renal decompression is indicated at this time.  Retained, encrusted right ureteral stent with massive hydronephrosis and severe parenchymal atrophy.   I believe he will be best served by a right nephroureterectomy with removal of the stent and bladder stone.   I will discuss this with one of my partners, but he may need to be referred to [facility] because of his Medicare network.  His wife will look into changing plans if possible to stay local.     AKI.  His Cr is up from baseline and there is progressive hydronephrosis but I don't think  the right kidney is contributing to his overall renal function so the AKI maybe more hydrational or related to the infection.   Continue to monitor and if it doesn't improve, we could consider a nephrostomy but it is unlikely to  help the renal function.      No follow-ups on file.    CC: Dr. Esaw Grandchild.      Bjorn Pippin 04/02/2021 [phone]

## 2021-04-02 NOTE — Plan of Care (Signed)
  Problem: Clinical Measurements: Goal: Diagnostic test results will improve Outcome: Progressing   Problem: Clinical Measurements: Goal: Respiratory complications will improve Outcome: Progressing   Problem: Clinical Measurements: Goal: Cardiovascular complication will be avoided Outcome: Progressing   Problem: Elimination: Goal: Will not experience complications related to bowel motility Outcome: Progressing   Problem: Skin Integrity: Goal: Risk for impaired skin integrity will decrease Outcome: Progressing   Problem: Safety: Goal: Ability to remain free from injury will improve Outcome: Progressing   

## 2021-04-02 NOTE — Progress Notes (Signed)
Pt in stable condition on arrival to floor. Assessment and Vs performed. Family at bedside.

## 2021-04-03 DIAGNOSIS — I10 Essential (primary) hypertension: Secondary | ICD-10-CM | POA: Diagnosis not present

## 2021-04-03 DIAGNOSIS — Z7189 Other specified counseling: Secondary | ICD-10-CM

## 2021-04-03 LAB — COMPREHENSIVE METABOLIC PANEL
ALT: 42 U/L (ref 0–44)
AST: 32 U/L (ref 15–41)
Albumin: 1.9 g/dL — ABNORMAL LOW (ref 3.5–5.0)
Alkaline Phosphatase: 70 U/L (ref 38–126)
Anion gap: 9 (ref 5–15)
BUN: 31 mg/dL — ABNORMAL HIGH (ref 8–23)
CO2: 23 mmol/L (ref 22–32)
Calcium: 8.7 mg/dL — ABNORMAL LOW (ref 8.9–10.3)
Chloride: 108 mmol/L (ref 98–111)
Creatinine, Ser: 1.51 mg/dL — ABNORMAL HIGH (ref 0.61–1.24)
GFR, Estimated: 48 mL/min — ABNORMAL LOW (ref >60)
Glucose, Bld: 126 mg/dL — ABNORMAL HIGH (ref 70–99)
Potassium: 3.7 mmol/L (ref 3.5–5.1)
Sodium: 140 mmol/L (ref 135–145)
Total Bilirubin: 0.4 mg/dL (ref 0.3–1.2)
Total Protein: 5.7 g/dL — ABNORMAL LOW (ref 6.5–8.1)

## 2021-04-03 LAB — CBC
HCT: 34.6 % — ABNORMAL LOW (ref 39.0–52.0)
Hemoglobin: 11.5 g/dL — ABNORMAL LOW (ref 13.0–17.0)
MCH: 28.5 pg (ref 26.0–34.0)
MCHC: 33.2 g/dL (ref 30.0–36.0)
MCV: 85.9 fL (ref 80.0–100.0)
Platelets: 360 10*3/uL (ref 150–400)
RBC: 4.03 MIL/uL — ABNORMAL LOW (ref 4.22–5.81)
RDW: 15.1 % (ref 11.5–15.5)
WBC: 15.7 10*3/uL — ABNORMAL HIGH (ref 4.0–10.5)
nRBC: 0 % (ref 0.0–0.2)

## 2021-04-03 MED ORDER — FLORANEX PO PACK
1.0000 g | PACK | Freq: Two times a day (BID) | ORAL | Status: DC
  Administered 2021-04-03 – 2021-04-14 (×22): 1 g via ORAL
  Filled 2021-04-03 (×27): qty 1

## 2021-04-03 MED ORDER — BACID PO TABS
2.0000 | ORAL_TABLET | Freq: Three times a day (TID) | ORAL | Status: DC
  Filled 2021-04-03: qty 2

## 2021-04-03 NOTE — Progress Notes (Signed)
Met with patient and his wife, Nicholas Mckenzie, on rounds today.   We had a conversation to clarify patient's request yesterday to have his code status changed to DNR.  Patient remains mildly confused, not oriented to year (said 1921), slow to respond when asked where we are (not fully oriented to place).  Wife reports patient's mental status is not at his baseline, agrees he remains confused.  Wife also reports that patient and she have had these discussions previously.  Patient has stated he would not want to be "kept alive" if requiring long term life support, but would want to have CPR and other measures in the event of cardiac or respiratory arrest.  Code status changed back to full code.   Wife also states that they would NOT WANT ANY BLOOD PRODUCTS, if any blood transfusions were indicated.  

## 2021-04-03 NOTE — TOC Initial Note (Signed)
Transition of Care [facility]) - Initial/Assessment Note   Patient Details  Name: Nicholas Mckenzie MRN: [id number] Date of Birth: 1946/04/14  [facility]) CM/SW Contact:    Ewing Schlein, LCSW Phone Number: 04/03/2021, 2:55 PM  Clinical Narrative: TOC received consult for help at home per patient's wife's request. CSW spoke with wife, Nicholas Mckenzie, to follow up with consult. Per wife, she is unsure what help the patient will need at home as the patient was not able to participate with PT today. CSW explained that HH is covered by insurance, but if they patient needs PCS it will be private pay. TOC awaiting PT recommendations.  Expected Discharge Plan: Home/Self Care Barriers to Discharge: Continued Medical Work up  Patient Goals and CMS Choice Patient states their goals for this hospitalization and ongoing recovery are:: Get some help at home CMS Medicare.gov Compare Post Acute Care list provided to:: Patient Represenative (must comment) Choice offered to / list presented to : Spouse  Expected Discharge Plan and Services Expected Discharge Plan: Home/Self Care In-house Referral: Clinical Social Work Living arrangements for the past 2 months: Single Family Home             DME Arranged: N/A DME Agency: NA  Prior Living Arrangements/Services Living arrangements for the past 2 months: Single Family Home Lives with:: Spouse Patient language and need for interpreter reviewed:: Yes Do you feel safe going back to the place where you live?: Yes      Need for Family Participation in Patient Care: Yes (Comment) (Patient has acute encephalopathy and is oriented x2.) Care giver support system in place?: Yes (comment) Criminal Activity/Legal Involvement Pertinent to Current Situation/Hospitalization: No - Comment as needed  Activities of Daily Living Home Assistive Devices/Equipment: CPAP, Hearing aid ADL Screening (condition at time of admission) Patient's cognitive ability adequate to safely  complete daily activities?: Yes Is the patient deaf or have difficulty hearing?: Yes Does the patient have difficulty seeing, even when wearing glasses/contacts?: No Does the patient have difficulty concentrating, remembering, or making decisions?: No Patient able to express need for assistance with ADLs?: Yes Does the patient have difficulty dressing or bathing?: No Independently performs ADLs?: Yes (appropriate for developmental age) Does the patient have difficulty walking or climbing stairs?: Yes Weakness of Legs: Both Weakness of Arms/Hands: None  Emotional Assessment Attitude/Demeanor/Rapport: Unable to Assess Affect (typically observed): Unable to Assess Orientation: : Oriented to Self, Oriented to Place Alcohol / Substance Use: Not Applicable Psych Involvement: No (comment)  Admission diagnosis:  Hydroureteronephrosis [N13.30] Acute encephalopathy [G93.40] AKI (acute kidney injury) (HCC) [N17.9] Patient Active Problem List   Diagnosis Date Noted   Acute encephalopathy 04/02/2021   Ureteral stent occlusion, sequela 04/02/2021   Elevated transaminase level 04/02/2021   CKD (chronic kidney disease) stage 3a 03/11/2021   Major depressive disorder, recurrent episode, in partial remission (HCC) 03/11/2021   Hypertension    Sleep apnea    PCP:  Eartha Inch, MD Pharmacy:   [id number] [place], [place] - [address] AT [facility] [address] [facility] [address] [place] [address] Phone: [phone] Fax: [phone]  [facility] #[id number] [place], [place] - [address] AT [facility] OF [facility] [address] & [facility] [address] [place] [address] Phone: [phone] Fax: [phone]  Readmission Risk Interventions No flowsheet data found.

## 2021-04-03 NOTE — Progress Notes (Addendum)
PROGRESS NOTE    Nicholas Mckenzie   HTD:[id number]  DOB: [birth year]  PCP: Nicholas Noon, MD    DOA: 04/01/2021 LOS: 1    Brief Narrative / Hospital Course to Date:   75 y.o. male with medical history significant for hypertension, nephrolithiasis, OSA on CPAP, chronic renal insufficiency, and depression, now presenting to emergency department for evaluation of fatigue, lightheadedness, dysuria, confusion, nausea, vomiting, and loose stools. Marland KitchenMarland KitchenMarland KitchenHe was discharged from the hospital 3 weeks ago after an accidental overdose on multiple prescription medications, was doing well initially, but over the past 1 to 2 weeks, has become more fatigued, intermittently confused, developed nausea, poor appetite, nonbloody vomiting, loose stools, and has had pain with urination.    Evaluation in the ED notable for leukocytosis, creatinine 1.63, slightly elevated LFTs, urinalysis consistent with infection showing many bacteria, large leukocytes, greater than 50 WBCs.  Head CT was negative for anything acute.  CT of abdomen and pelvis showed progressive severe right hydroureteronephrosis likely due to obstruction of the right ureteral stent, 3.1 cm stone in the bladder lumen, multiple right ureteral calculi stable from prior study.  Treated with empiric IV antibiotics and IV fluids in the ED and admitted to [facility] service for further evaluation and management.  Urology is consulted and following.  Assessment & Plan   Principal Problem:   Acute encephalopathy Active Problems:   CKD (chronic kidney disease) stage 3a   Hypertension   Sleep apnea   Major depressive disorder, recurrent episode, in partial remission ([place])   Ureteral stent occlusion, sequela   Elevated transaminase level   Goals of care, counseling/discussion   Complicated UTI due to right ureteral stent obstruction Cystolithiasis -urology is managing, see their recommendations.  Expect patient will need right nephro ureterectomy and  cystolithotomy. --Urology recommending transfer [facility] due to [facility] outside insurance network and would be high out-of-pocket cost --Continue empiric Rocephin --Follow urine cultures  Acute metabolic encephalopathy -likely due to infection.  Patient presented with intermittent confusion and about 10 days poor appetite, fatigue, nausea vomiting dysuria and found to have complicated UTI.  Head CT was negative.  Mental status improving with antibiotics but patient still mildly confused. -- Treat underlying causes as above --Delirium precautions  Nausea vomiting diarrhea -reported on admission. Abdominal exam is benign and no bowel wall thickening or inflammatory changes seen on CT abdomen pelvis. --Continue supportive care with IV hydration --Monitor electrolytes  Transaminitis  -Presented with mildly elevated transaminases but normal alk phos and bilirubin.  No acute hepatobiliary findings on CT abdomen pelvis ED. --Hold statin --Trend LFTs  CKD stage IIIa -presented with creatinine 1.63 up just slightly from the month before in the setting of hypovolemia.  Renal function improved with IV hydration. --Monitor BMP -- Avoid nephrotoxins and hypotension  Obstructive sleep apnea -continue CPAP nightly  Essential hypertension -BP stable upon admission while patient had been off antihypertensives at home for couple weeks.  Treat as needed for now.  Depression -Prozac held on admission due to somnolence and diarrhea   Obesity: Body mass index is 31.55 kg/m (pended).  Complicates overall care and prognosis.  Recommend lifestyle modifications including physical activity and diet for weight loss and overall long-term health.   DVT prophylaxis: enoxaparin (LOVENOX) injection 40 mg Start: 04/02/21 1000   Diet:  Diet Orders (From admission, onward)     Start     Ordered   04/02/21 0344  Diet Heart Room service appropriate? Yes; Fluid consistency: Thin  Diet effective now  Question  Answer Comment  Room service appropriate? Yes   Fluid consistency: Thin      04/02/21 0344              Code Status: Full Code   Subjective 04/03/21    Pt seen with wife at bedside today.  Pt still a bit confused, wife confirms he is not at his baseline mental status, not himself.   Pt reports feeling okay.  Currently no acute complaints.    Wife working on supplemental insurance coverage to get urology procedure/surgery covered vs paying out of pocket vs transferring to [facility].    Disposition Plan & Communication   Status is: Inpatient  Remains inpatient appropriate because:Ongoing diagnostic testing needed not appropriate for outpatient work up. Remains on IV antibiotics.  Fevers.  Dispo: The patient is from: Home              Anticipated d/c is to: Home              Patient currently is not medically stable to d/c.   Difficult to place patient No    Family Communication: wife, Nicholas Mckenzie, at bedside on rounds today. See separate note regarding code status discussion.     Consults, Procedures, Significant Events   Consultants:  Urology  Procedures:  None  Antimicrobials:  Anti-infectives (From admission, onward)    Start     Dose/Rate Route Frequency Ordered Stop   04/03/21 0215  cefTRIAXone (ROCEPHIN) 1 g in sodium chloride 0.9 % 100 mL IVPB        1 g 200 mL/hr over 30 Minutes Intravenous Every 24 hours 04/02/21 0344     04/02/21 0215  cefTRIAXone (ROCEPHIN) 1 g in sodium chloride 0.9 % 100 mL IVPB        1 g 200 mL/hr over 30 Minutes Intravenous  Once 04/02/21 0213 04/02/21 0251         Micro    Objective   Vitals:   04/02/21 1610 04/02/21 2215 04/03/21 0239 04/03/21 0604  BP: 138/72 (!) 148/70 (!) 150/77 129/76  Pulse: 76 88 78 79  Resp: 18     Temp: 98.4 F (36.9 C) (!) 101.4 F (38.6 C) 99.5 F (37.5 C) 99.9 F (37.7 C)  TempSrc: Oral Oral Oral Oral  SpO2: 99% 100% 100% 97%  Weight:      Height:        Intake/Output Summary (Last 24  hours) at 04/03/2021 1516 Last data filed at 04/03/2021 0600 Gross per 24 hour  Intake 1982.59 ml  Output --  Net 1982.59 ml   Filed Weights   04/01/21 2043 04/02/21 1500  Weight: 98.4 kg (P) 94.1 kg    Physical Exam:  General exam: awake, alert, no acute distress, mildly confused HEENT: clear conjunctiva, anicteric sclera, moist mucus membranes, hearing grossly normal  Respiratory system: CTAB, no wheezes, rales or rhonchi, normal respiratory effort. Cardiovascular system: normal S1/S2, RRR, no pedal edema.   Gastrointestinal system: soft, non-tender abdomen. Central nervous system: alert, oriented to self (not to year, delayed answer to place). no gross focal neurologic deficits, normal speech Skin: dry, intact, normal temperature Psychiatry: normal mood, congruent affect, judgement and insight appear normal  Labs   Data Reviewed: I have personally reviewed following labs and imaging studies  CBC: Recent Labs  Lab 04/01/21 2255 04/02/21 0559 04/03/21 0600  WBC 12.0* 8.7 15.7*  NEUTROABS 10.6*  --   --   HGB 11.8* 11.7* 11.5*  HCT 37.0* 36.2*  34.6*  MCV 89.8 88.7 85.9  PLT 340 403* 921   Basic Metabolic Panel: Recent Labs  Lab 04/01/21 2255 04/02/21 0559 04/03/21 0600  NA 143 144 140  K 3.5 3.6 3.7  CL 111 107 108  CO2 '22 24 23  ' GLUCOSE 134* 117* 126*  BUN 39* 37* 31*  CREATININE 1.63* 1.59* 1.51*  CALCIUM 8.7* 9.2 8.7*   GFR: Estimated Creatinine Clearance: 49.5 mL/min (A) (by C-G formula based on SCr of 1.51 mg/dL (H)). Liver Function Tests: Recent Labs  Lab 04/01/21 2255 04/02/21 0559 04/03/21 0600  AST 42* 32 32  ALT 46* 41 42  ALKPHOS 75 74 70  BILITOT 0.8 0.6 0.4  PROT 6.2* 6.2* 5.7*  ALBUMIN 2.2* 2.3* 1.9*   Recent Labs  Lab 04/01/21 2255  LIPASE 40   No results for input(s): AMMONIA in the last 168 hours. Coagulation Profile: No results for input(s): INR, PROTIME in the last 168 hours. Cardiac Enzymes: No results for input(s):  CKTOTAL, CKMB, CKMBINDEX, TROPONINI in the last 168 hours. BNP (last 3 results) No results for input(s): PROBNP in the last 8760 hours. HbA1C: No results for input(s): HGBA1C in the last 72 hours. CBG: No results for input(s): GLUCAP in the last 168 hours. Lipid Profile: No results for input(s): CHOL, HDL, LDLCALC, TRIG, CHOLHDL, LDLDIRECT in the last 72 hours. Thyroid Function Tests: No results for input(s): TSH, T4TOTAL, FREET4, T3FREE, THYROIDAB in the last 72 hours. Anemia Panel: No results for input(s): VITAMINB12, FOLATE, FERRITIN, TIBC, IRON, RETICCTPCT in the last 72 hours. Sepsis Labs: Recent Labs  Lab 04/01/21 2255 04/02/21 0200  LATICACIDVEN 1.3 1.0    Recent Results (from the past 240 hour(s))  Resp Panel by RT-PCR (Flu A&B, Covid) Nasopharyngeal Swab     Status: None   Collection Time: 04/01/21  8:00 PM   Specimen: Nasopharyngeal Swab; Nasopharyngeal(NP) swabs in vial transport medium  Result Value Ref Range Status   SARS Coronavirus 2 by RT PCR NEGATIVE NEGATIVE Final    Comment: (NOTE) SARS-CoV-2 target nucleic acids are NOT DETECTED.  The SARS-CoV-2 RNA is generally detectable in upper respiratory specimens during the acute phase of infection. The lowest concentration of SARS-CoV-2 viral copies this assay can detect is 138 copies/mL. A negative result does not preclude SARS-Cov-2 infection and should not be used as the sole basis for treatment or other patient management decisions. A negative result may occur with  improper specimen collection/handling, submission of specimen other than nasopharyngeal swab, presence of viral mutation(s) within the areas targeted by this assay, and inadequate number of viral copies(<138 copies/mL). A negative result must be combined with clinical observations, patient history, and epidemiological information. The expected result is Negative.  Fact Sheet for Patients:  EntrepreneurPulse.com.au  Fact Sheet  for Healthcare Providers:  IncredibleEmployment.be  This test is no t yet approved or cleared by the [place] FDA and  has been authorized for detection and/or diagnosis of SARS-CoV-2 by FDA under an Emergency Use Authorization (EUA). This EUA will remain  in effect (meaning this test can be used) for the duration of the COVID-19 declaration under Section 564(b)(1) of the Act, 21 U.S.C.section 360bbb-3(b)(1), unless the authorization is terminated  or revoked sooner.       Influenza A by PCR NEGATIVE NEGATIVE Final   Influenza B by PCR NEGATIVE NEGATIVE Final    Comment: (NOTE) The Xpert Xpress SARS-CoV-2/FLU/RSV plus assay is intended as an aid in the diagnosis of influenza from Nasopharyngeal swab specimens and  should not be used as a sole basis for treatment. Nasal washings and aspirates are unacceptable for Xpert Xpress SARS-CoV-2/FLU/RSV testing.  Fact Sheet for Patients: EntrepreneurPulse.com.au  Fact Sheet for Healthcare Providers: IncredibleEmployment.be  This test is not yet approved or cleared by the [place] FDA and has been authorized for detection and/or diagnosis of SARS-CoV-2 by FDA under an Emergency Use Authorization (EUA). This EUA will remain in effect (meaning this test can be used) for the duration of the COVID-19 declaration under Section 564(b)(1) of the Act, 21 U.S.C. section 360bbb-3(b)(1), unless the authorization is terminated or revoked.  Performed at [facility], [address] [place], [place] [address]       Imaging Studies   CT ABDOMEN PELVIS WO CONTRAST  Result Date: 04/01/2021 CLINICAL DATA:  Diarrhea, weakness, dizziness for 2 weeks EXAM: CT ABDOMEN AND PELVIS WITHOUT CONTRAST TECHNIQUE: Multidetector CT imaging of the abdomen and pelvis was performed following the standard protocol without IV contrast. Unenhanced CT was performed per clinician order.  Lack of IV contrast limits sensitivity and specificity, especially for evaluation of abdominal/pelvic solid viscera. COMPARISON:  02/10/2021 FINDINGS: Lower chest: No acute pleural or parenchymal lung disease. Small hiatal hernia. Hepatobiliary: No focal liver abnormality is seen. No gallstones, gallbladder wall thickening, or biliary dilatation. Pancreas: Unremarkable. No pancreatic ductal dilatation or surrounding inflammatory changes. Spleen: Normal in size without focal abnormality. Adrenals/Urinary Tract: There is progressive severe right-sided obstructive uropathy with worsening hydronephrosis and hydroureter. Nonobstructing right renal calculus measuring 1 cm again noted. An indwelling right ureteral stent is coiled within the dilated right renal pelvis, and extends into the bladder lumen. A large concretion surrounds the distal aspect of the stent within the bladder lumen, measuring 3.1 cm, likely resulting in stent malfunction. There are multiple right ureteral calculi along the course of the right ureteral stent, largest measuring 1.1 cm. Stable small caliceal calculi within the upper pole left kidney measuring up to 7 mm. No left-sided obstruction. The bladder is decompressed, limiting its evaluation. The adrenals are unremarkable. Stomach/Bowel: No bowel obstruction or ileus. Extensive diverticulosis of the distal colon without diverticulitis. No bowel wall thickening or inflammatory change. Vascular/Lymphatic: Aortic atherosclerosis. No enlarged abdominal or pelvic lymph nodes. Reproductive: Prostate is unremarkable. Other: No free fluid or free intraperitoneal gas. Stable fat containing right inguinal hernia. No bowel herniation. Musculoskeletal: No acute or destructive bony lesions. Reconstructed images demonstrate no additional findings. IMPRESSION: 1. Progressive severe right-sided hydronephrosis and hydroureter, likely due to malfunction of the indwelling right ureteral stent. A 3.1 cm concretion  surrounding the distal margin of the right ureteral stent within the bladder lumen likely results in stent obstruction. 2. Bilateral nonobstructing renal calculi. 3. Multiple right ureteral calculi are seen along the course of the ureteral stent, largest measuring 1.1 cm. These are stable. 4. Distal colonic diverticulosis without diverticulitis. 5. Small hiatal hernia. 6.  Choose 1 Electronically Signed   By: Randa Ngo M.D.   On: 04/01/2021 22:06   CT HEAD WO CONTRAST (5MM)  Result Date: 04/02/2021 CLINICAL DATA:  Delirium EXAM: CT HEAD WITHOUT CONTRAST TECHNIQUE: Contiguous axial images were obtained from the base of the skull through the vertex without intravenous contrast. COMPARISON:  None. FINDINGS: Brain: There is no mass, hemorrhage or extra-axial collection. There is generalized atrophy without lobar predilection. Hypodensity of the white matter is most commonly associated with chronic microvascular disease. 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 Vascular: No abnormal hyperdensity of the major  intracranial arteries or dural venous sinuses. No intracranial atherosclerosis. Skull: The visualized skull base, calvarium and extracranial soft tissues are normal. Sinuses/Orbits: No fluid levels or advanced mucosal thickening of the visualized paranasal sinuses. No mastoid or middle ear effusion. The orbits are normal. IMPRESSION: 1. No acute intracranial abnormality. 2. Generalized atrophy and chronic microvascular ischemia. Electronically Signed   By: Ulyses Jarred M.D.   On: 04/02/2021 01:22     Medications   Scheduled Meds:  amLODipine  5 mg Oral Daily   enoxaparin (LOVENOX) injection  40 mg Subcutaneous Q24H   lactobacillus  1 g Oral BID   Continuous Infusions:  cefTRIAXone (ROCEPHIN)  IV Stopped (04/03/21 0238)       LOS: 1 day    Time spent: 40 minutes with > 50% spent at bedside and in coordination of care     Ezekiel Slocumb, DO [facility]  04/03/2021, 3:16 PM      If 7PM-7AM, please contact night-coverage. How to contact the [facility] Attending or Consulting provider [place] or covering provider during after hours [place], for this patient?    Check the care team in [facility] and look for a) attending/consulting [facility] provider listed and b) the [facility] team listed Log into [email or web address] and use [place]'s universal password to access. If you do not have the password, please contact the hospital operator. Locate the [facility] provider you are looking for under [facility] and page to a number that you can be directly reached. If you still have difficulty reaching the provider, please page the [facility] (Director on Call) for the Hospitalists listed on amion for assistance.

## 2021-04-03 NOTE — Progress Notes (Deleted)
Met with patient and his wife, [name], on rounds today.   We had a conversation to clarify patient's request yesterday to have his code status changed to DNR.  Patient remains mildly confused, not oriented to year (said [place]), slow to respond when asked where we are (not fully oriented to place).  Wife reports patient's mental status is not at his baseline, agrees he remains confused.  Wife also reports that patient and she have had these discussions previously.  Patient has stated he would not want to be "kept alive" if requiring long term life support, but would want to have CPR and other measures in the event of cardiac or respiratory arrest.  Code status changed back to full code.   Wife also states that they would NOT WANT ANY BLOOD PRODUCTS, if any blood transfusions were indicated.

## 2021-04-03 NOTE — Evaluation (Signed)
Physical Therapy Evaluation Patient Details Name: Nicholas Mckenzie MRN: [id number] DOB: 09-06-1945 Today's Date: 04/03/2021  History of Present Illness  75 yo male admitted with AMS, UTI, weakness, fevers, R obstructed ureteral stent + bladder stone-urology following. Hx of chronic renal insufficiency, OSA  Clinical Impression  On eval, pt required Min A for mobility. He stood for ~ 10 seconds at bedside. Mobility was limited 2* dizziness-pt reported spinning and c/o some nausea. Did not visualize any nystagmus during my session. Pt c/o spinning/dizziness that lasted greater than 1 minute. Dizziness lessened once in bed but still lingered. Wife reports that he had a couple of falls prior to admission.  Will plan to follow and progress activity as tolerated.      Recommendations for follow up therapy are one component of a multi-disciplinary discharge planning process, led by the attending physician.  Recommendations may be updated based on patient status, additional functional criteria and insurance authorization.  Follow Up Recommendations Home health PT;Supervision/Assistance - 24 hour (depending on progress)    Equipment Recommendations   (continuing to assess-possibly walker?)    Recommendations for Other Services       Precautions / Restrictions Precautions Precautions: Fall Precaution Comments: dizzness, nausea Restrictions Weight Bearing Restrictions: No      Mobility  Bed Mobility Overal bed mobility: Needs Assistance Bed Mobility: Supine to Sit;Sit to Supine     Supine to sit: Min guard;HOB elevated Sit to supine: Min assist   General bed mobility comments: x 3 2* pt abruptly returning to supine due to dizziness (spinning). He relied on bedrail. Assist for LEs back onto bed.    Transfers Overall transfer level: Needs assistance   Transfers: Sit to/from Stand Sit to Stand: Min assist         General transfer comment: Assist to steady once standing 2* dizziness. Pt  used bedrail for support as well. Stood for ~10 seconds before having to sit back down 2* dizziness.  Ambulation/Gait             General Gait Details: Nt-unable to safely attempt on today 2* dizziness.  Stairs            Wheelchair Mobility    Modified Rankin (Stroke Patients Only)       Balance Overall balance assessment: Needs assistance;History of Falls Sitting-balance support: Feet supported Sitting balance-Leahy Scale: Fair     Standing balance support: Single extremity supported Standing balance-Leahy Scale: Poor                               Pertinent Vitals/Pain Pain Assessment: Faces Faces Pain Scale: No hurt Pain Location: wife reports he has pain when urinating    Home Living Family/patient expects to be discharged to:: Private residence Living Arrangements: Spouse/significant other Available Help at Discharge: Family Type of Home: House Home Access: Ramped entrance     Home Layout: One level Home Equipment: None      Prior Function Level of Independence: Independent               Hand Dominance        Extremity/Trunk Assessment   Upper Extremity Assessment Upper Extremity Assessment: Defer to OT evaluation    Lower Extremity Assessment Lower Extremity Assessment: Generalized weakness    Cervical / Trunk Assessment Cervical / Trunk Assessment: Normal  Communication   Communication: No difficulties  Cognition Arousal/Alertness: Awake/alert Behavior During Therapy: WFL for tasks assessed/performed Overall Cognitive  Status: Impaired/Different from baseline Area of Impairment: Problem solving                             Problem Solving: Slow processing;Requires verbal cues General Comments: mostly oriented at time of my session.      General Comments      Exercises     Assessment/Plan    PT Assessment Patient needs continued PT services  PT Problem List Decreased strength;Decreased  mobility;Decreased activity tolerance;Decreased balance;Pain;Decreased cognition       PT Treatment Interventions Gait training;DME instruction;Therapeutic exercise;Balance training;Functional mobility training;Therapeutic activities;Patient/family education    PT Goals (Current goals can be found in the Care Plan section)  Acute Rehab PT Goals Patient Stated Goal: get rid of dizziness. get better PT Goal Formulation: With patient/family Time For Goal Achievement: 04/17/21 Potential to Achieve Goals: Good    Frequency Min 3X/week   Barriers to discharge        Co-evaluation               AM-PAC PT "6 Clicks" Mobility  Outcome Measure Help needed turning from your back to your side while in a flat bed without using bedrails?: A Little Help needed moving from lying on your back to sitting on the side of a flat bed without using bedrails?: A Little Help needed moving to and from a bed to a chair (including a wheelchair)?: A Little Help needed standing up from a chair using your arms (e.g., wheelchair or bedside chair)?: A Little Help needed to walk in hospital room?: A Lot Help needed climbing 3-5 steps with a railing? : A Lot 6 Click Score: 16    End of Session   Activity Tolerance:  (limited by dizziness) Patient left: in bed;with call bell/phone within reach;with bed alarm set;with family/visitor present   PT Visit Diagnosis: Muscle weakness (generalized) (M62.81);Difficulty in walking, not elsewhere classified (R26.2)    Time: 1884-1660 PT Time Calculation (min) (ACUTE ONLY): 19 min   Charges:   PT Evaluation $PT Eval Moderate Complexity: 1 Mod             Faye Ramsay, PT Acute Rehabilitation  Office: [phone] Pager: [phone]

## 2021-04-03 NOTE — Progress Notes (Signed)
Patient ID: Nicholas Mckenzie, male   DOB: 08/09/1945, 75 y.o.   MRN: [id number]    Subjective: Kincaid had a fever to 101.4 overnight but the curve is declining.  His WBC is up to 15.7.   He denies pain.    ROS:  Review of Systems  Constitutional:  Positive for fever.  Gastrointestinal:  Negative for abdominal pain.  Genitourinary:  Positive for frequency.   Anti-infectives: Anti-infectives (From admission, onward)    Start     Dose/Rate Route Frequency Ordered Stop   04/03/21 0215  cefTRIAXone (ROCEPHIN) 1 g in sodium chloride 0.9 % 100 mL IVPB        1 g 200 mL/hr over 30 Minutes Intravenous Every 24 hours 04/02/21 0344     04/02/21 0215  cefTRIAXone (ROCEPHIN) 1 g in sodium chloride 0.9 % 100 mL IVPB        1 g 200 mL/hr over 30 Minutes Intravenous  Once 04/02/21 0213 04/02/21 0251       Current Facility-Administered Medications  Medication Dose Route Frequency Provider Last Rate Last Admin   acetaminophen (TYLENOL) tablet 650 mg  650 mg Oral Q6H PRN Briscoe Deutscher, MD   650 mg at 04/02/21 2216   Or   acetaminophen (TYLENOL) suppository 650 mg  650 mg Rectal Q6H PRN Opyd, Lavone Neri, MD       amLODipine (NORVASC) tablet 5 mg  5 mg Oral Daily Esaw Grandchild A, DO   5 mg at 04/02/21 1441   cefTRIAXone (ROCEPHIN) 1 g in sodium chloride 0.9 % 100 mL IVPB  1 g Intravenous Q24H Briscoe Deutscher, MD   Stopped at 04/03/21 0238   enoxaparin (LOVENOX) injection 40 mg  40 mg Subcutaneous Q24H Opyd, Lavone Neri, MD   40 mg at 04/02/21 0921   ondansetron (ZOFRAN) tablet 4 mg  4 mg Oral Q6H PRN Briscoe Deutscher, MD   4 mg at 04/03/21 0618   Or   ondansetron (ZOFRAN) injection 4 mg  4 mg Intravenous Q6H PRN Opyd, Lavone Neri, MD         Objective: Vital signs in last 24 hours: Temp:  [98.4 F (36.9 C)-101.4 F (38.6 C)] 99.9 F (37.7 C) (10/10 0604) Pulse Rate:  [76-89] 79 (10/10 0604) Resp:  [18-20] 18 (10/09 1610) BP: (129-158)/(70-80) 129/76 (10/10 0604) SpO2:  [94 %-100 %] 97 % (10/10  0604) Weight:  [94.1 kg] (P) 94.1 kg (10/09 1500)  Intake/Output from previous day: 10/09 0701 - 10/10 0700 In: 2742.6 [P.O.:1200; I.V.:1345.7; IV Piggyback:196.9] Out: 300 [Urine:300] Intake/Output this shift: No intake/output data recorded.   Physical Exam Vitals reviewed.  Constitutional:      Appearance: Normal appearance.  Abdominal:     Palpations: Abdomen is soft.     Tenderness: There is no abdominal tenderness.  Neurological:     Mental Status: He is alert.    Lab Results:  Recent Labs    04/02/21 0559 04/03/21 0600  WBC 8.7 15.7*  HGB 11.7* 11.5*  HCT 36.2* 34.6*  PLT 403* 360   BMET Recent Labs    04/02/21 0559 04/03/21 0600  NA 144 140  K 3.6 3.7  CL 107 108  CO2 24 23  GLUCOSE 117* 126*  BUN 37* 31*  CREATININE 1.59* 1.51*  CALCIUM 9.2 8.7*   PT/INR No results for input(s): LABPROT, INR in the last 72 hours. ABG No results for input(s): PHART, HCO3 in the last 72 hours.  Invalid input(s): PCO2, PO2  Studies/Results: CT ABDOMEN PELVIS WO CONTRAST  Result Date: 04/01/2021 CLINICAL DATA:  Diarrhea, weakness, dizziness for 2 weeks EXAM: CT ABDOMEN AND PELVIS WITHOUT CONTRAST TECHNIQUE: Multidetector CT imaging of the abdomen and pelvis was performed following the standard protocol without IV contrast. Unenhanced CT was performed per clinician order. Lack of IV contrast limits sensitivity and specificity, especially for evaluation of abdominal/pelvic solid viscera. COMPARISON:  02/10/2021 FINDINGS: Lower chest: No acute pleural or parenchymal lung disease. Small hiatal hernia. Hepatobiliary: No focal liver abnormality is seen. No gallstones, gallbladder wall thickening, or biliary dilatation. Pancreas: Unremarkable. No pancreatic ductal dilatation or surrounding inflammatory changes. Spleen: Normal in size without focal abnormality. Adrenals/Urinary Tract: There is progressive severe right-sided obstructive uropathy with worsening hydronephrosis and  hydroureter. Nonobstructing right renal calculus measuring 1 cm again noted. An indwelling right ureteral stent is coiled within the dilated right renal pelvis, and extends into the bladder lumen. A large concretion surrounds the distal aspect of the stent within the bladder lumen, measuring 3.1 cm, likely resulting in stent malfunction. There are multiple right ureteral calculi along the course of the right ureteral stent, largest measuring 1.1 cm. Stable small caliceal calculi within the upper pole left kidney measuring up to 7 mm. No left-sided obstruction. The bladder is decompressed, limiting its evaluation. The adrenals are unremarkable. Stomach/Bowel: No bowel obstruction or ileus. Extensive diverticulosis of the distal colon without diverticulitis. No bowel wall thickening or inflammatory change. Vascular/Lymphatic: Aortic atherosclerosis. No enlarged abdominal or pelvic lymph nodes. Reproductive: Prostate is unremarkable. Other: No free fluid or free intraperitoneal gas. Stable fat containing right inguinal hernia. No bowel herniation. Musculoskeletal: No acute or destructive bony lesions. Reconstructed images demonstrate no additional findings. IMPRESSION: 1. Progressive severe right-sided hydronephrosis and hydroureter, likely due to malfunction of the indwelling right ureteral stent. A 3.1 cm concretion surrounding the distal margin of the right ureteral stent within the bladder lumen likely results in stent obstruction. 2. Bilateral nonobstructing renal calculi. 3. Multiple right ureteral calculi are seen along the course of the ureteral stent, largest measuring 1.1 cm. These are stable. 4. Distal colonic diverticulosis without diverticulitis. 5. Small hiatal hernia. 6.  Choose 1 Electronically Signed   By: Sharlet Salina M.D.   On: 04/01/2021 22:06   CT HEAD WO CONTRAST ( )  Result Date: 04/02/2021 CLINICAL DATA:  Delirium EXAM: CT HEAD WITHOUT CONTRAST TECHNIQUE: Contiguous axial images were  obtained from the base of the skull through the vertex without intravenous contrast. COMPARISON:  None. FINDINGS: Brain: There is no mass, hemorrhage or extra-axial collection. There is generalized atrophy without lobar predilection. Hypodensity of the white matter is most commonly associated with chronic microvascular disease. 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 Vascular: No abnormal hyperdensity of the major intracranial arteries or dural venous sinuses. No intracranial atherosclerosis. Skull: The visualized skull base, calvarium and extracranial soft tissues are normal. Sinuses/Orbits: No fluid levels or advanced mucosal thickening of the visualized paranasal sinuses. No mastoid or middle ear effusion. The orbits are normal. IMPRESSION: 1. No acute intracranial abnormality. 2. Generalized atrophy and chronic microvascular ischemia. Electronically Signed   By: Deatra Robinson M.D.   On: 04/02/2021 01:22     Assessment and Plan: Retained stent with right renal obstruction with severe atrophy and a 3cm bladder stone.  He has no flank pain.  I have spoken to Dr. Berneice Heinrich who agrees with the need for a right nephroureterectomy and cystolithotomy and is willing to do the surgery if the  patient can get his insurance changed to a carrier that we participate with but if that doesn't happen.  He will need a referral to [facility].     Febrile UTI.   If he has persistent fevers, he will need a right nephrostomy tube.   I will continue to follow.       LOS: 1 day    Bjorn Pippin 04/03/2021 [phone]

## 2021-04-04 ENCOUNTER — Inpatient Hospital Stay (HOSPITAL_COMMUNITY): Payer: Medicare (Managed Care)

## 2021-04-04 ENCOUNTER — Encounter (HOSPITAL_COMMUNITY): Payer: Self-pay | Admitting: Family Medicine

## 2021-04-04 ENCOUNTER — Other Ambulatory Visit: Payer: Self-pay | Admitting: Urology

## 2021-04-04 DIAGNOSIS — G9340 Encephalopathy, unspecified: Secondary | ICD-10-CM | POA: Diagnosis not present

## 2021-04-04 DIAGNOSIS — I10 Essential (primary) hypertension: Secondary | ICD-10-CM | POA: Diagnosis not present

## 2021-04-04 LAB — COMPREHENSIVE METABOLIC PANEL
ALT: 35 U/L (ref 0–44)
AST: 26 U/L (ref 15–41)
Albumin: 2.1 g/dL — ABNORMAL LOW (ref 3.5–5.0)
Alkaline Phosphatase: 71 U/L (ref 38–126)
Anion gap: 10 (ref 5–15)
BUN: 29 mg/dL — ABNORMAL HIGH (ref 8–23)
CO2: 23 mmol/L (ref 22–32)
Calcium: 8.3 mg/dL — ABNORMAL LOW (ref 8.9–10.3)
Chloride: 107 mmol/L (ref 98–111)
Creatinine, Ser: 1.67 mg/dL — ABNORMAL HIGH (ref 0.61–1.24)
GFR, Estimated: 43 mL/min — ABNORMAL LOW (ref >60)
Glucose, Bld: 131 mg/dL — ABNORMAL HIGH (ref 70–99)
Potassium: 3.4 mmol/L — ABNORMAL LOW (ref 3.5–5.1)
Sodium: 140 mmol/L (ref 135–145)
Total Bilirubin: 0.7 mg/dL (ref 0.3–1.2)
Total Protein: 5.8 g/dL — ABNORMAL LOW (ref 6.5–8.1)

## 2021-04-04 LAB — CBC
HCT: 32.8 % — ABNORMAL LOW (ref 39.0–52.0)
Hemoglobin: 10.9 g/dL — ABNORMAL LOW (ref 13.0–17.0)
MCH: 28.2 pg (ref 26.0–34.0)
MCHC: 33.2 g/dL (ref 30.0–36.0)
MCV: 85 fL (ref 80.0–100.0)
Platelets: 352 10*3/uL (ref 150–400)
RBC: 3.86 MIL/uL — ABNORMAL LOW (ref 4.22–5.81)
RDW: 15.1 % (ref 11.5–15.5)
WBC: 17.3 10*3/uL — ABNORMAL HIGH (ref 4.0–10.5)
nRBC: 0 % (ref 0.0–0.2)

## 2021-04-04 LAB — MAGNESIUM: Magnesium: 1.9 mg/dL (ref 1.7–2.4)

## 2021-04-04 IMAGING — CT CT ANGIO HEAD
2 of 11 series · 7 of 34 positions shown · IV contrast (APPLIED)
Comparison: Head CT [DATE].
COMPARISON: Head CT [DATE].

Addendum:
CLINICAL DATA: Vertigo, central. Additional history provided: Sinus
headache, vertigo, confusion.

EXAM:
CT ANGIOGRAPHY HEAD AND NECK
TECHNIQUE: Multidetector CT imaging of the head and neck was performed using
the standard protocol during bolus administration of intravenous
contrast. Multiplanar CT image reconstructions and MIPs were
obtained to evaluate the vascular anatomy. Carotid stenosis
measurements (when applicable) are obtained utilizing NASCET
criteria, using the distal internal carotid diameter as the
denominator.
CONTRAST:  60mL OMNIPAQUE IOHEXOL 350 MG/ML SOLN

[Series 9: cta head neck · axial · 0.46mm/px · z∈[+1378,+1500]mm · 2 of 185 slices shown]
[im 62/185  soft-tissue]
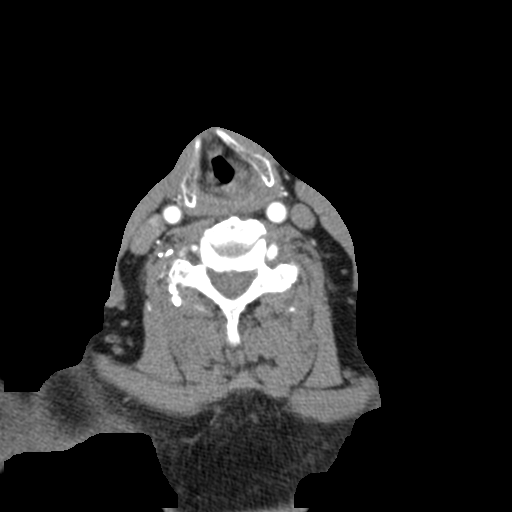
[im 123/185  soft-tissue]
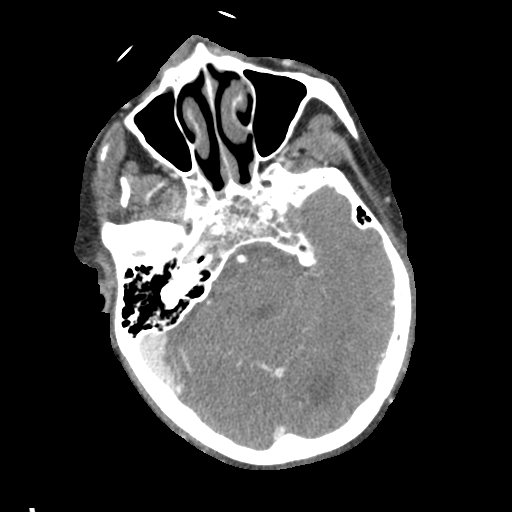

[Series 11: ax thin · axial · 0.46mm/px · z∈[+1325,+1570]mm · 5 of 369 slices shown]
[im 62/369  soft-tissue]
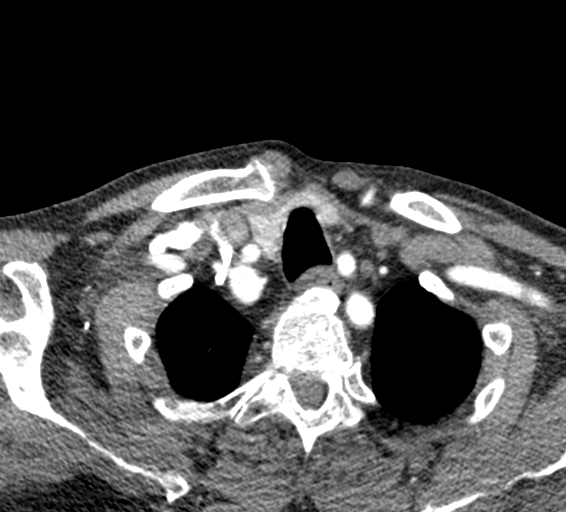
[im 123/369  bone]
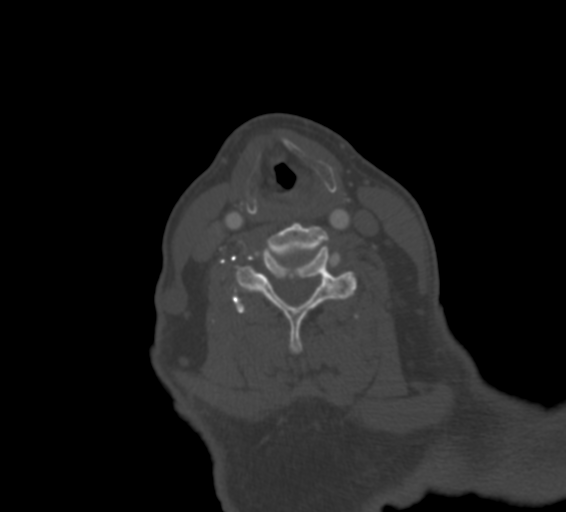
[im 185/369  soft-tissue]
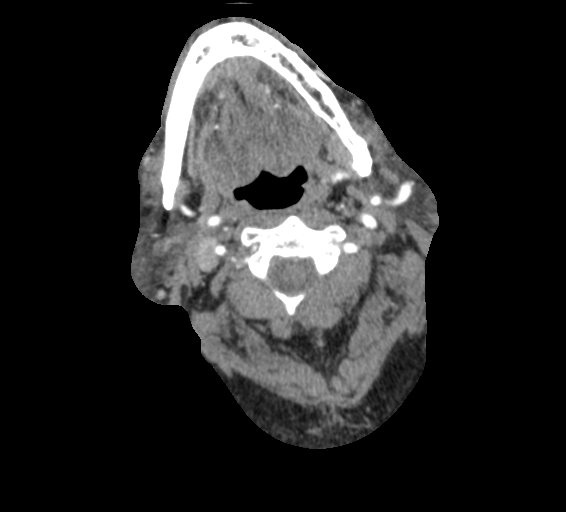
[im 246/369  bone]
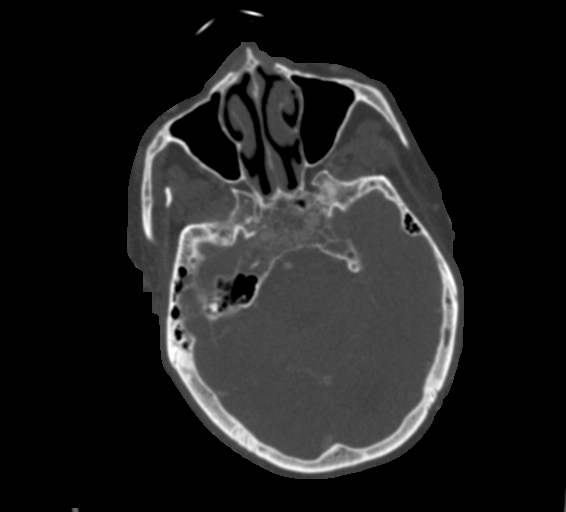
[im 307/369  soft-tissue]
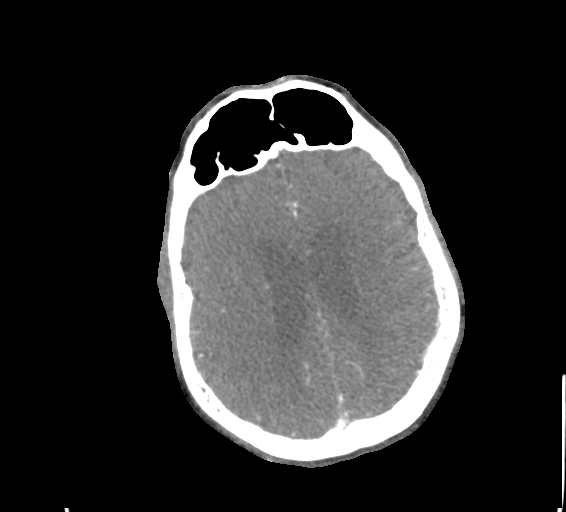

[7 of 34 positions shown; findings below may reference images not displayed]

FINDINGS: CT HEAD FINDINGS

Brain:

Mild generalized cerebral atrophy.

Acute/subacute appearing cortical/subcortical infarct within the
right parietooccipital lobes and callosal splenium (right PCA
vascular territory). No significant mass effect at this time. No
evidence of hemorrhagic conversion.

Chronic lacunar infarcts within the right basal ganglia.

Background moderate patchy and ill-defined hypoattenuation within
the cerebral white matter, nonspecific but compatible with chronic
small vessel ischemic disease.

There is no acute intracranial hemorrhage.

No extra-axial fluid collection.

No evidence of an intracranial mass.

No midline shift.

Vascular: Reported below.

Skull: Normal. Negative for fracture or focal lesion.

Sinuses: Postsurgical appearance of the paranasal sinuses. Small
mucous retention cyst versus polypoid mucosal thickening within the
left maxillary sinus.

Orbits: No acute or significant orbital finding.

Review of the MIP images confirms the above findings

CTA NECK FINDINGS

Aortic arch: Standard aortic branching. Atherosclerotic plaque
within the proximal major branch vessels of the neck. No
hemodynamically significant innominate or proximal subclavian artery
stenosis.

Right carotid system: CCA and ICA patent within the neck without
stenosis. Minimal soft and calcified plaque within the CCA.

Left carotid system: CCA and ICA patent within the neck without
stenosis. Minimal atherosclerotic plaque about the carotid
bifurcation.

Vertebral arteries: The non dominant right vertebral artery becomes
occluded at the C1-skull base level. The dominant left vertebral
artery is patent within the neck without stenosis.

Skeleton: Partially imaged thoracic dextrocurvature. Multilevel
bridging ventrolateral osteophytes with relative preservation of the
intervertebral disc spaces. These findings suggest diffuse
idiopathic skeletal hyperostosis (DISH). No acute bony abnormality
or aggressive osseous lesion.

Other neck: 1.4 cm right thyroid lobe nodule, not meeting consensus
criteria for ultrasound follow-up based on size.

Upper chest: No consolidation within the imaged lung apices.

Review of the MIP images confirms the above findings

CTA HEAD FINDINGS

Anterior circulation:

The intracranial internal carotid arteries are patent. Calcified
plaque within both vessels with no more than mild stenosis. There is
a severe, near occlusive stenosis of the proximal to mid M1 left
middle cerebral artery (series 14, image 57). The M1 right middle
cerebral artery is patent with mild atherosclerotic irregularity. No
M2 proximal branch occlusion or high-grade proximal stenosis is
identified. The anterior cerebral arteries are patent. Moderate
stenosis within the proximal right A1 segment.

Posterior circulation:

There is reconstitution of enhancement within the V4 right vertebral
artery, likely due to retrograde flow. Enhancement is present within
the proximal right PICA. Mild-to-moderate atherosclerotic narrowing
of the V4 left vertebral artery. The basilar artery is patent.
Severe stenosis of a right PCA branch at the P2/P3 junction.
Occlusion of a right PCA branch at the P3 segment. The left cerebral
artery is fetal in origin and patent. However, there is a severe
stenosis within the P2 left PCA. The right posterior communicating
artery is diminutive or absent.

Venous sinuses: Within the limitations of contrast timing, no
convincing thrombus.

Anatomic variants: As described.

Review of the MIP images confirms the above findings
IMPRESSION: CT head:

1. Acute/early subacute appearing right PCA territory
cortical/subcortical infarct within the right parietooccipital lobes
and callosal splenium. No significant mass effect at this time. No
evidence of hemorrhagic conversion.
2. Chronic lacunar infarcts within the right basal ganglia.
3. Background moderate chronic small vessel ischemic changes within
the cerebral white matter.
4. Mild generalized parenchymal atrophy.

CTA neck:

1. The non-dominant right vertebral artery becomes occluded at the
C1-skull base level.
2. The dominant left vertebral artery is patent throughout the neck
without stenosis.
3. Common and internal carotid arteries patent within the neck
bilaterally without stenosis.
4. Findings suggesting diffuse idiopathic skeletal hyperostosis
(DISH).

CTA head:

1. Occlusion of a P3 segment right posterior cerebral artery branch.
2. Severe stenosis of a right posterior cerebral artery branch at
the P2/P3 junction.
3. Severe stenosis within the P2 segment of the left posterior
cerebral artery.
4. Reconstitution of enhancement within the intracranial right
vertebral artery, likely due to retrograde flow.
5. Mild/moderate stenosis within the V4 left vertebral artery.
6. Severe, near-occlusive stenosis of the M1 left middle cerebral
artery.
7. Moderate stenosis of the right anterior cerebral artery A1
segment.
8. Atherosclerotic plaque within the intracranial internal carotid
arteries bilaterally with no more than mild stenosis.

ADDENDUM:
CT head impression 1, CTA neck impression 1 and CTA head impressions
1, 2 and 6 called by telephone at the time of interpretation on
[DATE] at [DATE] to provider Dr. MAGNER, who verbally
acknowledged these results.

*** End of Addendum ***
FINDINGS: CT HEAD FINDINGS

Brain:

Mild generalized cerebral atrophy.

Acute/subacute appearing cortical/subcortical infarct within the
right parietooccipital lobes and callosal splenium (right PCA
vascular territory). No significant mass effect at this time. No
evidence of hemorrhagic conversion.

Chronic lacunar infarcts within the right basal ganglia.

Background moderate patchy and ill-defined hypoattenuation within
the cerebral white matter, nonspecific but compatible with chronic
small vessel ischemic disease.

There is no acute intracranial hemorrhage.

No extra-axial fluid collection.

No evidence of an intracranial mass.

No midline shift.

Vascular: Reported below.

Skull: Normal. Negative for fracture or focal lesion.

Sinuses: Postsurgical appearance of the paranasal sinuses. Small
mucous retention cyst versus polypoid mucosal thickening within the
left maxillary sinus.

Orbits: No acute or significant orbital finding.

Review of the MIP images confirms the above findings

CTA NECK FINDINGS

Aortic arch: Standard aortic branching. Atherosclerotic plaque
within the proximal major branch vessels of the neck. No
hemodynamically significant innominate or proximal subclavian artery
stenosis.

Right carotid system: CCA and ICA patent within the neck without
stenosis. Minimal soft and calcified plaque within the CCA.

Left carotid system: CCA and ICA patent within the neck without
stenosis. Minimal atherosclerotic plaque about the carotid
bifurcation.

Vertebral arteries: The non dominant right vertebral artery becomes
occluded at the C1-skull base level. The dominant left vertebral
artery is patent within the neck without stenosis.

Skeleton: Partially imaged thoracic dextrocurvature. Multilevel
bridging ventrolateral osteophytes with relative preservation of the
intervertebral disc spaces. These findings suggest diffuse
idiopathic skeletal hyperostosis (DISH). No acute bony abnormality
or aggressive osseous lesion.

Other neck: 1.4 cm right thyroid lobe nodule, not meeting consensus
criteria for ultrasound follow-up based on size.

Upper chest: No consolidation within the imaged lung apices.

Review of the MIP images confirms the above findings

CTA HEAD FINDINGS

Anterior circulation:

The intracranial internal carotid arteries are patent. Calcified
plaque within both vessels with no more than mild stenosis. There is
a severe, near occlusive stenosis of the proximal to mid M1 left
middle cerebral artery (series 14, image 57). The M1 right middle
cerebral artery is patent with mild atherosclerotic irregularity. No
M2 proximal branch occlusion or high-grade proximal stenosis is
identified. The anterior cerebral arteries are patent. Moderate
stenosis within the proximal right A1 segment.

Posterior circulation:

There is reconstitution of enhancement within the V4 right vertebral
artery, likely due to retrograde flow. Enhancement is present within
the proximal right PICA. Mild-to-moderate atherosclerotic narrowing
of the V4 left vertebral artery. The basilar artery is patent.
Severe stenosis of a right PCA branch at the P2/P3 junction.
Occlusion of a right PCA branch at the P3 segment. The left cerebral
artery is fetal in origin and patent. However, there is a severe
stenosis within the P2 left PCA. The right posterior communicating
artery is diminutive or absent.

Venous sinuses: Within the limitations of contrast timing, no
convincing thrombus.

Anatomic variants: As described.

Review of the MIP images confirms the above findings
IMPRESSION: CT head:

1. Acute/early subacute appearing right PCA territory
cortical/subcortical infarct within the right parietooccipital lobes
and callosal splenium. No significant mass effect at this time. No
evidence of hemorrhagic conversion.
2. Chronic lacunar infarcts within the right basal ganglia.
3. Background moderate chronic small vessel ischemic changes within
the cerebral white matter.
4. Mild generalized parenchymal atrophy.

CTA neck:

1. The non-dominant right vertebral artery becomes occluded at the
C1-skull base level.
2. The dominant left vertebral artery is patent throughout the neck
without stenosis.
3. Common and internal carotid arteries patent within the neck
bilaterally without stenosis.
4. Findings suggesting diffuse idiopathic skeletal hyperostosis
(DISH).

CTA head:

1. Occlusion of a P3 segment right posterior cerebral artery branch.
2. Severe stenosis of a right posterior cerebral artery branch at
the P2/P3 junction.
3. Severe stenosis within the P2 segment of the left posterior
cerebral artery.
4. Reconstitution of enhancement within the intracranial right
vertebral artery, likely due to retrograde flow.
5. Mild/moderate stenosis within the V4 left vertebral artery.
6. Severe, near-occlusive stenosis of the M1 left middle cerebral
artery.
7. Moderate stenosis of the right anterior cerebral artery A1
segment.
8. Atherosclerotic plaque within the intracranial internal carotid
arteries bilaterally with no more than mild stenosis.

## 2021-04-04 MED ORDER — ASPIRIN 300 MG RE SUPP
300.0000 mg | Freq: Every day | RECTAL | Status: DC
  Filled 2021-04-04 (×5): qty 1

## 2021-04-04 MED ORDER — ASPIRIN 325 MG PO TABS
325.0000 mg | ORAL_TABLET | Freq: Once | ORAL | Status: AC
  Administered 2021-04-04: 325 mg via ORAL
  Filled 2021-04-04: qty 1

## 2021-04-04 MED ORDER — CLOPIDOGREL BISULFATE 75 MG PO TABS
300.0000 mg | ORAL_TABLET | Freq: Once | ORAL | Status: AC
  Administered 2021-04-04: 300 mg via ORAL
  Filled 2021-04-04: qty 4

## 2021-04-04 MED ORDER — CLOPIDOGREL BISULFATE 75 MG PO TABS
75.0000 mg | ORAL_TABLET | Freq: Every day | ORAL | Status: DC
  Administered 2021-04-05 – 2021-04-14 (×10): 75 mg via ORAL
  Filled 2021-04-04 (×10): qty 1

## 2021-04-04 MED ORDER — POTASSIUM CHLORIDE CRYS ER 20 MEQ PO TBCR
40.0000 meq | EXTENDED_RELEASE_TABLET | Freq: Once | ORAL | Status: AC
  Administered 2021-04-04: 40 meq via ORAL
  Filled 2021-04-04: qty 2

## 2021-04-04 MED ORDER — STROKE: EARLY STAGES OF RECOVERY BOOK
Freq: Once | Status: AC
  Filled 2021-04-04: qty 1

## 2021-04-04 MED ORDER — ASPIRIN 81 MG PO CHEW
81.0000 mg | CHEWABLE_TABLET | Freq: Every day | ORAL | Status: DC
  Administered 2021-04-05 – 2021-04-14 (×10): 81 mg via ORAL
  Filled 2021-04-04 (×10): qty 1

## 2021-04-04 MED ORDER — POLYETHYLENE GLYCOL 3350 17 GM/SCOOP PO POWD
1.0000 | Freq: Once | ORAL | Status: AC
  Administered 2021-04-04: 255 g via ORAL
  Filled 2021-04-04: qty 255

## 2021-04-04 MED ORDER — IOHEXOL 350 MG/ML SOLN
60.0000 mL | Freq: Once | INTRAVENOUS | Status: AC | PRN
  Administered 2021-04-04: 60 mL via INTRAVENOUS

## 2021-04-04 NOTE — Progress Notes (Signed)
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 Placed years ago per wife for stones at another facility. Distal aspect of stent with 3cm+ bladder stone. Appears 1 artery / 2 vein (lower accessory inferior to artery) right renovascular anatomy. Colon antero-renal.   2 - Urinary Tract Infection - enterococcus by [facility] 04/02/21 on eval mental status changes, placed on empiric ceftriaxone.   Today "Nicholas Mckenzie" is stable. Still with some significant confusion, not yet at baseline per wife Nicholas Mckenzie). CT earlier today with subacute large CVA for which he will require antiplatelet therapy.    Objective: Vital signs in last 24 hours: Temp:  [99 F (37.2 C)-100.4 F (38 C)] 100.4 F (38 C) (10/11 1349) Pulse Rate:  [75-93] 93 (10/11 1349) Resp:  [16-20] 20 (10/11 1349) BP: (132-142)/(75-80) 137/80 (10/11 1349) SpO2:  [96 %-100 %] 100 % (10/11 1349) Weight:  [94.4 kg-95.7 kg] 94.4 kg (10/10 2000) Last BM Date: 04/02/21  Intake/Output from previous day: 10/10 0701 - 10/11 0700 In: 820 [P.O.:720; IV Piggyback:100] Out: 500 [Urine:500] Intake/Output this shift: No intake/output data recorded.  AOx1 On CPAP Wife Nicholas Mckenzie at bedside and provides all history RRR by monitor Stable obese abdomen w/o r/g External penile urine collection bag in place No c/c/e  Lab Results:  Recent Labs    04/03/21 0600 04/04/21 0311  WBC 15.7* 17.3*  HGB 11.5* 10.9*  HCT 34.6* 32.8*  PLT 360 352   BMET Recent Labs    04/03/21 0600 04/04/21 0311  NA 140 140  K 3.7 3.4*  CL 108 107  CO2 23 23  GLUCOSE 126* 131*  BUN 31* 29*  CREATININE 1.51* 1.67*  CALCIUM 8.7* 8.3*   PT/INR No results for input(s): LABPROT, INR in the last 72 hours. ABG No results for input(s): PHART, HCO3 in the last 72 hours.  Invalid input(s): PCO2, PO2  Studies/Results: No results  found.  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 I feel most definitive management would be RIGHT nephroureterectomy (decompress kdiney early in procedure to allow more room in abdomen, address hilum and ureter to around iliacs from lateral approach, then convert to pelvic approach with small cystotomy to address large bladder stone / distal stent component, foley x about 10 days before cystogram and removal as optimal.   Risks (including bleeding, infection, mortality), benefits, alternatives, expected peri-op course discussed extensively with wife Nicholas Mckenzie [facility]). WE specifically discussed his desire to refuse all blood products even if life-saving and we will respect this.   UNFORTUNATELY, his subacute stroke clearly takes precedence and we will put above on hold. INstead.  Ref Rt neph tube as temporizing measure to decompress, reassess potential for surgery in elective setting.    2 - Urinary Tract Infection - agree with current ABX pending additional CX data. Removing  / decompressing infected nidus (kidney) should help as well.   Greatly appreciate hospitalist team comangment of this complex and challenging patient.    Sebastian Ache 04/04/2021

## 2021-04-04 NOTE — Progress Notes (Signed)
Physical Therapy Treatment Patient Details Name: Nicholas Mckenzie MRN: [id number] DOB: 09/01/1945 Today's Date: 04/04/2021   History of Present Illness 75 yo male admitted with AMS, UTI, weakness, fevers, R obstructed ureteral stent + bladder stone-urology following. Hx of chronic renal insufficiency, OSA.    PT Comments    Patient remains limited by dizziness with mobility. Pt easily fatigued by orthostatic vitals at start of session and requesting seated rest throughout vitals assessment. In sitting pt with Rt lateral and slight posterior lean. Vestibular evaluation completed; significant findings listed below: horizontal nystagmus at rest, direction changing nystagmus, impaired VOR and inability to fixate gaze on target, impaired occulomotor coordination with undershooting saccades during tracking, absent/impaired Lt visual field; and impaired finger to nose on Lt UE with normal result on Rt UE. Testing is suggestive of central lesion as the source of vestibular dysfunction and RN/MD notified. Acute PT will continue to progress pt as able. Pending findings recommend CIR rehab at this time for intense follow up.    Recommendations for follow up therapy are one component of a multi-disciplinary discharge planning process, led by the attending physician.  Recommendations may be updated based on patient status, additional functional criteria and insurance authorization.  Follow Up Recommendations   CIR     Equipment Recommendations  Rolling walker with 5" wheels (TBA)    Recommendations for Other Services       Precautions / Restrictions Precautions Precautions: Fall Restrictions Weight Bearing Restrictions: No      Vestibular Assessment - 04/04/21 0001       Vestibular Assessment   General Observation pt sitting at EOB for orthostatic vitals with RN. pt wtih Rt lateral lean and slight posterior lean.      Symptom Behavior   Subjective history of current problem Pt reports he feels  like he is falling to the left and backwards. pt's wife reports 2 posterior falls the day of admission. pt reports he had visual deficits PTA however wife clarifies he had readers but did not appear to have trouble with peripheral vision. when asked if vision is blurry, double, or feels like part of it is missing states "I feel like part is missing...the left side"    Type of Dizziness  Oscillopsia;Spinning;Vertigo;Blurred vision;Imbalance    Frequency of Dizziness constant    Duration of Dizziness constant    Symptom Nature Constant    Aggravating Factors No known aggravating factors;Spontaneous onset;Activity in general;Moving eyes    Relieving Factors Lying supine;Closing eyes    Progression of Symptoms No change since onset    History of similar episodes no known prior episodes      Oculomotor Exam   Oculomotor Alignment Abnormal   Rt eye medial drift and appears to rest slightly superior compared to Lt eye.   Ocular ROM abnormal   pt has decreased ROM tracking in Lt gaze and eyes drift back to midline position.   Spontaneous Direction changing nystagmus   spontaneous horizonatl beating nystagmus at rest in room light. increased and direction changing with gaze fixation.   Gaze-induced  Direction changing nystagmus    Head shaking Horizontal Comment   pt with inability to maintain gaze and nystagmus direction changing   Head Shaking Vertical Comment   inability to maintain gaze, no vertical nystagmus present   Smooth Pursuits Saccades   pt with abnoraml catch-up saccades in bil visual fields. pt unable to catch up towards Lt visual field due to loss of vision.   Saccades Dysmetria;Poor trajectory;Undershoots  pt with abnormal saccades and undershoots with Rt tracking and Rt eye drifting medialling. pt unable to fully track to Lt signifcant undershooting.     Oculomotor Exam-Fixation Suppressed    Left Head Impulse pt unable to maintain fixation on target durign VOR testing with direction  changing nystagmus and poor occularmotor control/coordination.    Right Head Impulse pt unable to maintain fixation on target durign VOR testing with direction changing nystagmus and poor occularmotor control/coordination.      Vestibulo-Ocular Reflex   VOR Cancellation Unable to maintain gaze   pt unable to maintain fixation on target durign VOR.             Mobility  Bed Mobility Overal bed mobility: Needs Assistance Bed Mobility: Supine to Sit;Sit to Supine     Supine to sit: Min guard;HOB elevated Sit to supine: Min guard        Transfers Overall transfer level: Needs assistance Equipment used: Rolling walker (2 wheeled) Transfers: Sit to/from Stand Sit to Stand: Min assist            Ambulation/Gait                 Stairs             Wheelchair Mobility    Modified Rankin (Stroke Patients Only)       Balance Overall balance assessment: Needs assistance;History of Falls Sitting-balance support: Feet supported Sitting balance-Leahy Scale: Fair   Postural control: Posterior lean;Right lateral lean Standing balance support: Bilateral upper extremity supported Standing balance-Leahy Scale: Poor                              Cognition Arousal/Alertness: Awake/alert Behavior During Therapy: WFL for tasks assessed/performed Overall Cognitive Status: Impaired/Different from baseline Area of Impairment: Orientation;Attention;Following commands;Problem solving                 Orientation Level: Disoriented to;Place (slightly) Current Attention Level: Selective   Following Commands: Follows multi-step commands with increased time;Follows multi-step commands inconsistently;Follows one step commands with increased time     Problem Solving: Slow processing;Requires verbal cues;Decreased initiation        Exercises      General Comments        Pertinent Vitals/Pain Pain Assessment: No/denies pain    Home Living                       Prior Function            PT Goals (current goals can now be found in the care plan section) Acute Rehab PT Goals PT Goal Formulation: With patient/family Time For Goal Achievement: 04/17/21 Potential to Achieve Goals: Good Progress towards PT goals: Progressing toward goals    Frequency    Min 3X/week      PT Plan Current plan remains appropriate    Co-evaluation              AM-PAC PT "6 Clicks" Mobility   Outcome Measure  Help needed turning from your back to your side while in a flat bed without using bedrails?: A Little Help needed moving from lying on your back to sitting on the side of a flat bed without using bedrails?: A Little Help needed moving to and from a bed to a chair (including a wheelchair)?: A Little Help needed standing up from a chair using your arms (e.g., wheelchair or bedside chair)?:  A Little Help needed to walk in hospital room?: A Lot Help needed climbing 3-5 steps with a railing? : A Lot 6 Click Score: 16    End of Session Equipment Utilized During Treatment: Gait belt Activity Tolerance: Patient limited by fatigue Patient left: in bed;with call bell/phone within reach;with bed alarm set;with family/visitor present Nurse Communication: Mobility status;Other (comment) (central lesion/source concerns) PT Visit Diagnosis: Muscle weakness (generalized) (M62.81);Difficulty in walking, not elsewhere classified (R26.2)     Time: 4967-5916 PT Time Calculation (min) (ACUTE ONLY): 31 min  Charges:  $Therapeutic Activity: 8-22 mins $Physical Performance Test: 8-22 mins                     Wynn Maudlin, DPT Acute Rehabilitation Services Office [phone] Pager [phone]    Anitra Lauth 04/04/2021, 1:29 PM

## 2021-04-04 NOTE — Progress Notes (Signed)
PROGRESS NOTE    Nicholas Mckenzie   PPI:[id number]  DOB: [birth year]  PCP: Chesley Noon, MD    DOA: 04/01/2021 LOS: 2    Brief Narrative / Hospital Course to Date:   75 y.o. male with medical history significant for hypertension, nephrolithiasis, OSA on CPAP, chronic renal insufficiency, and depression, now presenting to emergency department for evaluation of fatigue, lightheadedness, dysuria, confusion, nausea, vomiting, and loose stools. Marland KitchenMarland KitchenMarland KitchenHe was discharged from the hospital 3 weeks ago after an accidental overdose on multiple prescription medications, was doing well initially, but over the past 1 to 2 weeks, has become more fatigued, intermittently confused, developed nausea, poor appetite, nonbloody vomiting, loose stools, and has had pain with urination.    Evaluation in the ED notable for leukocytosis, creatinine 1.63, slightly elevated LFTs, urinalysis consistent with infection showing many bacteria, large leukocytes, greater than 50 WBCs.  Head CT was negative for anything acute.  CT of abdomen and pelvis showed progressive severe right hydroureteronephrosis likely due to obstruction of the right ureteral stent, 3.1 cm stone in the bladder lumen, multiple right ureteral calculi stable from prior study.  Treated with empiric IV antibiotics and IV fluids in the ED and admitted to [facility] service for further evaluation and management.  Urology is consulted and following.  Assessment & Plan   Principal Problem:   Acute encephalopathy Active Problems:   CKD (chronic kidney disease) stage 3a   Hypertension   Sleep apnea   Major depressive disorder, recurrent episode, in partial remission ([place])   Ureteral stent occlusion, sequela   Elevated transaminase level   Goals of care, counseling/discussion   Complicated UTI due to right ureteral stent obstruction Cystolithiasis -urology is managing, see their recommendations.  Expect patient will need right nephro ureterectomy and  cystolithotomy. --Urology following --Continue empiric Rocephin --Follow urine cultures --Plan for perc nephrostomy placement tomorrow --Eventual nephroureterectomy in future  Acute Right PCA Stroke - with symptoms of vertigo and blurry vision.  See CTA head/neck report.  Also with multiple areas of intracranial vascular stenosis. --Neurology consulted --Transfer to [facility]  --MRI Brain still pending - follow up --ASA 325 and load Plavix --Further evaluation per Neurology --PT/OT/SLP  Acute metabolic encephalopathy -likely due to infection and stroke.  Patient presented with intermittent confusion and about 10 days poor appetite, fatigue, nausea vomiting dysuria and found to have complicated UTI.  Head CT on admission was negative.  Mental status improving with antibiotics but patient still mildly confused. 10/11: nystagmus noted, found to have a stroke --Treat underlying causes as above --Delirium precautions  Nausea vomiting diarrhea -reported on admission. Abdominal exam is benign and no bowel wall thickening or inflammatory changes seen on CT abdomen pelvis. --Continue supportive care with IV hydration --Monitor electrolytes  Transaminitis  -Presented with mildly elevated transaminases but normal alk phos and bilirubin.  No acute hepatobiliary findings on CT abdomen pelvis ED. --Hold statin --Trend LFTs  CKD stage IIIa -presented with creatinine 1.63 up just slightly from the month before in the setting of hypovolemia.  Renal function improved with IV hydration. --Monitor BMP -- Avoid nephrotoxins and hypotension  Obstructive sleep apnea -continue CPAP nightly  Essential hypertension -BP stable upon admission while patient had been off antihypertensives at home for couple weeks.  Treat as needed for now.  Depression -Prozac held on admission due to somnolence and diarrhea   Obesity: Body mass index is 31.66 kg/m.  Complicates overall care and prognosis.  Recommend  lifestyle modifications including physical activity and  diet for weight loss and overall long-term health.   DVT prophylaxis: enoxaparin (LOVENOX) injection 40 mg Start: 04/02/21 1000   Diet:  Diet Orders (From admission, onward)     Start     Ordered   04/04/21 1117  Diet clear liquid Fluid consistency: Thin  Diet effective now       Comments: The day before surgery.  Question:  Fluid consistency:  Answer:  Thin   04/04/21 1116              Code Status: Full Code   Subjective 04/04/21    Pt seen with wife at bedside today.  PT reported noticing nystagmus during evaluation today.  Pt endorses dizziness / room spinning even at rest, not only in change of positions.  He also reports feeling his vision is more blurry than usual but unable to say when he first noticed these symptoms.    Disposition Plan & Communication   Status is: Inpatient  Remains inpatient appropriate because:Ongoing diagnostic testing needed not appropriate for outpatient work up. Remains on IV antibiotics.  Fevers.  Dispo: The patient is from: Home              Anticipated d/c is to: Home              Patient currently is not medically stable to d/c.   Difficult to place patient No    Family Communication: wife, [name], at bedside on rounds 10/10, 10/11.   Consults, Procedures, Significant Events   Consultants:  Urology  Procedures:  None  Antimicrobials:  Anti-infectives (From admission, onward)    Start     Dose/Rate Route Frequency Ordered Stop   04/03/21 0215  cefTRIAXone (ROCEPHIN) 1 g in sodium chloride 0.9 % 100 mL IVPB        1 g 200 mL/hr over 30 Minutes Intravenous Every 24 hours 04/02/21 0344     04/02/21 0215  cefTRIAXone (ROCEPHIN) 1 g in sodium chloride 0.9 % 100 mL IVPB        1 g 200 mL/hr over 30 Minutes Intravenous  Once 04/02/21 0213 04/02/21 0251         Micro    Objective   Vitals:   04/03/21 2157 04/04/21 0255 04/04/21 0611 04/04/21 1349  BP: (!) 141/75  132/77 (!) 142/77 137/80  Pulse: 92 85 89 93  Resp: '17 16 16 20  ' Temp: 99.9 F (37.7 C) 99.1 F (37.3 C) 99 F (37.2 C) (!) 100.4 F (38 C)  TempSrc: Oral Oral  Oral  SpO2: 99% 96% 97% 100%  Weight:      Height:        Intake/Output Summary (Last 24 hours) at 04/04/2021 1707 Last data filed at 04/04/2021 0649 Gross per 24 hour  Intake 820 ml  Output 500 ml  Net 320 ml   Filed Weights   04/02/21 1500 04/03/21 1957 04/03/21 2000  Weight: (P) 94.1 kg 95.7 kg 94.4 kg    Physical Exam:  General exam: awake, alert, no acute distress, mildly confused HEENT: no nystagmus seen, moist mucus membranes, hearing grossly normal  Respiratory system: lungs clear, normal respiratory effort. Cardiovascular system: normal S1/S2, RRR, no pedal edema.   Gastrointestinal system: soft, non-tender abdomen. Central nervous system: normal speech, CN's grossly intact, motor intact and symmetric in extremities Skin: dry, intact, normal temperature Psychiatry: normal mood, congruent affect  Labs   Data Reviewed: I have personally reviewed following labs and imaging studies  CBC: Recent Labs  Lab 04/01/21 2255 04/02/21 0559 04/03/21 0600 04/04/21 0311  WBC 12.0* 8.7 15.7* 17.3*  NEUTROABS 10.6*  --   --   --   HGB 11.8* 11.7* 11.5* 10.9*  HCT 37.0* 36.2* 34.6* 32.8*  MCV 89.8 88.7 85.9 85.0  PLT 340 403* 360 845   Basic Metabolic Panel: Recent Labs  Lab 04/01/21 2255 04/02/21 0559 04/03/21 0600 04/04/21 0311  NA 143 144 140 140  K 3.5 3.6 3.7 3.4*  CL 111 107 108 107  CO2 '22 24 23 23  ' GLUCOSE 134* 117* 126* 131*  BUN 39* 37* 31* 29*  CREATININE 1.63* 1.59* 1.51* 1.67*  CALCIUM 8.7* 9.2 8.7* 8.3*  MG  --   --   --  1.9   GFR: Estimated Creatinine Clearance: 43.9 mL/min (A) (by C-G formula based on SCr of 1.67 mg/dL (H)). Liver Function Tests: Recent Labs  Lab 04/01/21 2255 04/02/21 0559 04/03/21 0600 04/04/21 0311  AST 42* 32 32 26  ALT 46* 41 42 35  ALKPHOS 75 74  70 71  BILITOT 0.8 0.6 0.4 0.7  PROT 6.2* 6.2* 5.7* 5.8*  ALBUMIN 2.2* 2.3* 1.9* 2.1*   Recent Labs  Lab 04/01/21 2255  LIPASE 40   No results for input(s): AMMONIA in the last 168 hours. Coagulation Profile: No results for input(s): INR, PROTIME in the last 168 hours. Cardiac Enzymes: No results for input(s): CKTOTAL, CKMB, CKMBINDEX, TROPONINI in the last 168 hours. BNP (last 3 results) No results for input(s): PROBNP in the last 8760 hours. HbA1C: No results for input(s): HGBA1C in the last 72 hours. CBG: No results for input(s): GLUCAP in the last 168 hours. Lipid Profile: No results for input(s): CHOL, HDL, LDLCALC, TRIG, CHOLHDL, LDLDIRECT in the last 72 hours. Thyroid Function Tests: No results for input(s): TSH, T4TOTAL, FREET4, T3FREE, THYROIDAB in the last 72 hours. Anemia Panel: No results for input(s): VITAMINB12, FOLATE, FERRITIN, TIBC, IRON, RETICCTPCT in the last 72 hours. Sepsis Labs: Recent Labs  Lab 04/01/21 2255 04/02/21 0200  LATICACIDVEN 1.3 1.0    Recent Results (from the past 240 hour(s))  Resp Panel by RT-PCR (Flu A&B, Covid) Nasopharyngeal Swab     Status: None   Collection Time: 04/01/21  8:00 PM   Specimen: Nasopharyngeal Swab; Nasopharyngeal(NP) swabs in vial transport medium  Result Value Ref Range Status   SARS Coronavirus 2 by RT PCR NEGATIVE NEGATIVE Final    Comment: (NOTE) SARS-CoV-2 target nucleic acids are NOT DETECTED.  The SARS-CoV-2 RNA is generally detectable in upper respiratory specimens during the acute phase of infection. The lowest concentration of SARS-CoV-2 viral copies this assay can detect is 138 copies/mL. A negative result does not preclude SARS-Cov-2 infection and should not be used as the sole basis for treatment or other patient management decisions. A negative result may occur with  improper specimen collection/handling, submission of specimen other than nasopharyngeal swab, presence of viral mutation(s) within  the areas targeted by this assay, and inadequate number of viral copies(<138 copies/mL). A negative result must be combined with clinical observations, patient history, and epidemiological information. The expected result is Negative.  Fact Sheet for Patients:  EntrepreneurPulse.com.au  Fact Sheet for Healthcare Providers:  IncredibleEmployment.be  This test is no t yet approved or cleared by the [place] FDA and  has been authorized for detection and/or diagnosis of SARS-CoV-2 by FDA under an Emergency Use Authorization (EUA). This EUA will remain  in effect (meaning this test can be used) for the duration  of the COVID-19 declaration under Section 564(b)(1) of the Act, 21 U.S.C.section 360bbb-3(b)(1), unless the authorization is terminated  or revoked sooner.       Influenza A by PCR NEGATIVE NEGATIVE Final   Influenza B by PCR NEGATIVE NEGATIVE Final    Comment: (NOTE) The Xpert Xpress SARS-CoV-2/FLU/RSV plus assay is intended as an aid in the diagnosis of influenza from Nasopharyngeal swab specimens and should not be used as a sole basis for treatment. Nasal washings and aspirates are unacceptable for Xpert Xpress SARS-CoV-2/FLU/RSV testing.  Fact Sheet for Patients: EntrepreneurPulse.com.au  Fact Sheet for Healthcare Providers: IncredibleEmployment.be  This test is not yet approved or cleared by the [place] FDA and has been authorized for detection and/or diagnosis of SARS-CoV-2 by FDA under an Emergency Use Authorization (EUA). This EUA will remain in effect (meaning this test can be used) for the duration of the COVID-19 declaration under Section 564(b)(1) of the Act, 21 U.S.C. section 360bbb-3(b)(1), unless the authorization is terminated or revoked.  Performed at [facility], [place] [address], [place], [place] [address]   Urine Culture     Status: Abnormal  (Preliminary result)   Collection Time: 04/02/21  2:41 AM   Specimen: Urine, Clean Catch  Result Value Ref Range Status   Specimen Description   Final    URINE, CLEAN CATCH Performed at [facility], [place] [address], [place], [place] [address]    Special Requests   Final    NONE Performed at [facility], [place] [address], [place], [place] [address]    Culture (A)  Final    >=100,000 COLONIES/mL ENTEROBACTER SPECIES CULTURE REINCUBATED FOR BETTER GROWTH SUSCEPTIBILITIES TO FOLLOW Performed at [facility], [place] [address], [place], [place] [address]    Report Status PENDING  Incomplete  Culture, blood (routine x 2)     Status: None (Preliminary result)   Collection Time: 04/02/21 11:24 PM   Specimen: BLOOD  Result Value Ref Range Status   Specimen Description   Final    BLOOD BLOOD RIGHT HAND Performed at [place] [address] [place], [place] [address]    Special Requests   Final    BOTTLES DRAWN AEROBIC AND ANAEROBIC Blood Culture adequate volume Performed at [place] [address] [place], [place] [address]    Culture   Final    NO GROWTH 1 DAY Performed at [facility], [place] [address], [place], [place] [address]    Report Status PENDING  Incomplete  Culture, blood (routine x 2)     Status: None (Preliminary result)   Collection Time: 04/02/21 11:26 PM   Specimen: BLOOD  Result Value Ref Range Status   Specimen Description   Final    BLOOD RIGHT ANTECUBITAL Performed at [place] [address], [place], [place] [address]    Special Requests   Final    BOTTLES DRAWN AEROBIC AND ANAEROBIC Blood Culture adequate volume Performed at [place] [address], [place], [place] [address]    Culture   Final    NO GROWTH 1 DAY Performed at [facility], [place] [address] [place], [place] [address]    Report Status PENDING   Incomplete      Imaging Studies   CT ANGIO HEAD W OR WO CONTRAST  Addendum Date: 04/04/2021   ADDENDUM REPORT: 04/04/2021 15:55 ADDENDUM: CT head impression 1, CTA neck impression 1  and CTA head impressions 1, 2 and 6 called by telephone at the time of interpretation on 04/04/2021 at 3:50 pm to provider Dr. Arbutus Ped, who verbally acknowledged these results. Electronically Signed   By: Kellie Simmering D.O.   On: 04/04/2021 15:55   Result Date: 04/04/2021 CLINICAL DATA:  Vertigo, central. Additional history provided: Sinus headache, vertigo, confusion. EXAM: CT ANGIOGRAPHY HEAD AND NECK TECHNIQUE: Multidetector CT imaging of the head and neck was performed using the standard protocol during bolus administration of intravenous contrast. Multiplanar CT image reconstructions and MIPs were obtained to evaluate the vascular anatomy. Carotid stenosis measurements (when applicable) are obtained utilizing NASCET criteria, using the distal internal carotid diameter as the denominator. CONTRAST:  49m OMNIPAQUE IOHEXOL 350 MG/ML SOLN COMPARISON:  Head CT 04/02/2021. FINDINGS: CT HEAD FINDINGS Brain: Mild generalized cerebral atrophy. Acute/subacute appearing cortical/subcortical infarct within the right parietooccipital lobes and callosal splenium (right PCA vascular territory). No significant mass effect at this time. No evidence of hemorrhagic conversion. 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 Background moderate patchy and ill-defined hypoattenuation within the cerebral white matter, nonspecific but compatible with chronic small vessel ischemic disease. There is no acute intracranial hemorrhage. No extra-axial fluid collection. No evidence of an intracranial mass. No midline shift. Vascular: Reported below. Skull: Normal. Negative for fracture or focal lesion. Sinuses: Postsurgical appearance of the paranasal sinuses. Small mucous retention cyst versus polypoid mucosal thickening within the left  maxillary sinus. Orbits: No acute or significant orbital finding. Review of the MIP images confirms the above findings CTA NECK FINDINGS Aortic arch: Standard aortic branching. Atherosclerotic plaque within the proximal major branch vessels of the neck. No hemodynamically significant innominate or proximal subclavian artery stenosis. Right carotid system: CCA and ICA patent within the neck without stenosis. Minimal soft and calcified plaque within the CCA. Left carotid system: CCA and ICA patent within the neck without stenosis. Minimal atherosclerotic plaque about the carotid bifurcation. Vertebral arteries: The non dominant right vertebral artery becomes occluded at the C1-skull base level. The dominant left vertebral artery is patent within the neck without stenosis. Skeleton: Partially imaged thoracic dextrocurvature. Multilevel bridging ventrolateral osteophytes with relative preservation of the intervertebral disc spaces. 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 No acute bony abnormality or aggressive osseous lesion. Other neck: 1.4 cm right thyroid lobe nodule, not meeting consensus criteria for ultrasound follow-up based on size. Upper chest: No consolidation within the imaged lung apices. Review of the MIP images confirms the above findings CTA HEAD FINDINGS Anterior circulation: The intracranial internal carotid arteries are patent. Calcified plaque within both vessels with no more than mild stenosis. There is a severe, near occlusive stenosis of the proximal to mid M1 left middle cerebral artery (series 14, image 57). The M1 right middle cerebral artery is patent with mild atherosclerotic irregularity. No M2 proximal branch occlusion or high-grade proximal stenosis is identified. The anterior cerebral arteries are patent. Moderate stenosis within the proximal right A1 segment. Posterior circulation: There is reconstitution of enhancement within the V4 right vertebral artery,  likely due to retrograde flow. Enhancement is present within the proximal right PICA. Mild-to-moderate atherosclerotic narrowing of the V4 left vertebral artery. The basilar artery is patent. Severe stenosis of a right PCA branch at the P2/P3 junction. Occlusion of a right PCA branch at the P3 segment. The left cerebral artery is fetal in origin and patent. However, there is a severe stenosis within the P2 left PCA. The right posterior communicating artery is diminutive or  absent. Venous sinuses: Within the limitations of contrast timing, no convincing thrombus. Anatomic variants: As described. Review of the MIP images confirms the above findings IMPRESSION: CT head: 1. Acute/early subacute appearing right PCA territory cortical/subcortical infarct within the right parietooccipital lobes and callosal splenium. No significant mass effect at this time. No evidence of hemorrhagic conversion. 2. 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 3. Background moderate chronic small vessel ischemic changes within the cerebral white matter. 4. Mild generalized parenchymal atrophy. CTA neck: 1. The non-dominant right vertebral artery becomes occluded at the C1-skull base level. 2. The dominant left vertebral artery is patent throughout the neck without stenosis. 3. Common and internal carotid arteries patent within the neck bilaterally without stenosis. 4. 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 CTA head: 1. Occlusion of a P3 segment right posterior cerebral artery branch. 2. Severe stenosis of a right posterior cerebral artery branch at the P2/P3 junction. 3. Severe stenosis within the P2 segment of the left posterior cerebral artery. 4. Reconstitution of enhancement within the intracranial right vertebral artery, likely due to retrograde flow. 5. Mild/moderate stenosis within the V4 left vertebral artery. 6. 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 7.  Moderate stenosis of the right anterior cerebral artery A1 segment. 8. Atherosclerotic plaque within the intracranial internal carotid arteries bilaterally with no more than mild stenosis. Electronically Signed: By: Kellie Simmering D.O. On: 04/04/2021 15:45   CT ANGIO NECK W OR WO CONTRAST  Addendum Date: 04/04/2021   ADDENDUM REPORT: 04/04/2021 15:55 ADDENDUM: CT head impression 1, CTA neck impression 1 and CTA head impressions 1, 2 and 6 called by telephone at the time of interpretation on 04/04/2021 at 3:50 pm to provider Dr. Arbutus Ped, who verbally acknowledged these results. Electronically Signed   By: Kellie Simmering D.O.   On: 04/04/2021 15:55   Result Date: 04/04/2021 CLINICAL DATA:  Vertigo, central. Additional history provided: Sinus headache, vertigo, confusion. EXAM: CT ANGIOGRAPHY HEAD AND NECK TECHNIQUE: Multidetector CT imaging of the head and neck was performed using the standard protocol during bolus administration of intravenous contrast. Multiplanar CT image reconstructions and MIPs were obtained to evaluate the vascular anatomy. Carotid stenosis measurements (when applicable) are obtained utilizing NASCET criteria, using the distal internal carotid diameter as the denominator. CONTRAST:  54m OMNIPAQUE IOHEXOL 350 MG/ML SOLN COMPARISON:  Head CT 04/02/2021. FINDINGS: CT HEAD FINDINGS Brain: Mild generalized cerebral atrophy. Acute/subacute appearing cortical/subcortical infarct within the right parietooccipital lobes and callosal splenium (right PCA vascular territory). No significant mass effect at this time. No evidence of hemorrhagic conversion. 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 Background moderate patchy and ill-defined hypoattenuation within the cerebral white matter, nonspecific but compatible with chronic small vessel ischemic disease. There is no acute intracranial hemorrhage. No extra-axial fluid collection. No evidence of an intracranial mass. No midline shift.  Vascular: Reported below. Skull: Normal. Negative for fracture or focal lesion. Sinuses: Postsurgical appearance of the paranasal sinuses. Small mucous retention cyst versus polypoid mucosal thickening within the left maxillary sinus. Orbits: No acute or significant orbital finding. Review of the MIP images confirms the above findings CTA NECK FINDINGS Aortic arch: Standard aortic branching. Atherosclerotic plaque within the proximal major branch vessels of the neck. No hemodynamically significant innominate or proximal subclavian artery stenosis. Right carotid system: CCA and ICA patent within the neck without stenosis. Minimal soft and calcified plaque within the CCA. Left carotid system: CCA and ICA patent within the neck without stenosis. Minimal  atherosclerotic plaque about the carotid bifurcation. Vertebral arteries: The non dominant right vertebral artery becomes occluded at the C1-skull base level. The dominant left vertebral artery is patent within the neck without stenosis. Skeleton: Partially imaged thoracic dextrocurvature. Multilevel bridging ventrolateral osteophytes with relative preservation of the intervertebral disc spaces. 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 No acute bony abnormality or aggressive osseous lesion. Other neck: 1.4 cm right thyroid lobe nodule, not meeting consensus criteria for ultrasound follow-up based on size. Upper chest: No consolidation within the imaged lung apices. Review of the MIP images confirms the above findings CTA HEAD FINDINGS Anterior circulation: The intracranial internal carotid arteries are patent. Calcified plaque within both vessels with no more than mild stenosis. There is a severe, near occlusive stenosis of the proximal to mid M1 left middle cerebral artery (series 14, image 57). The M1 right middle cerebral artery is patent with mild atherosclerotic irregularity. No M2 proximal branch occlusion or high-grade proximal  stenosis is identified. The anterior cerebral arteries are patent. Moderate stenosis within the proximal right A1 segment. Posterior circulation: There is reconstitution of enhancement within the V4 right vertebral artery, likely due to retrograde flow. Enhancement is present within the proximal right PICA. Mild-to-moderate atherosclerotic narrowing of the V4 left vertebral artery. The basilar artery is patent. Severe stenosis of a right PCA branch at the P2/P3 junction. Occlusion of a right PCA branch at the P3 segment. The left cerebral artery is fetal in origin and patent. However, there is a severe stenosis within the P2 left PCA. The right posterior communicating artery is diminutive or absent. Venous sinuses: Within the limitations of contrast timing, no convincing thrombus. Anatomic variants: As described. Review of the MIP images confirms the above findings IMPRESSION: CT head: 1. Acute/early subacute appearing right PCA territory cortical/subcortical infarct within the right parietooccipital lobes and callosal splenium. No significant mass effect at this time. No evidence of hemorrhagic conversion. 2. 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 3. Background moderate chronic small vessel ischemic changes within the cerebral white matter. 4. Mild generalized parenchymal atrophy. CTA neck: 1. The non-dominant right vertebral artery becomes occluded at the C1-skull base level. 2. The dominant left vertebral artery is patent throughout the neck without stenosis. 3. Common and internal carotid arteries patent within the neck bilaterally without stenosis. 4. 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 CTA head: 1. Occlusion of a P3 segment right posterior cerebral artery branch. 2. Severe stenosis of a right posterior cerebral artery branch at the P2/P3 junction. 3. Severe stenosis within the P2 segment of the left posterior cerebral artery. 4. Reconstitution of enhancement  within the intracranial right vertebral artery, likely due to retrograde flow. 5. Mild/moderate stenosis within the V4 left vertebral artery. 6. 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 7. Moderate stenosis of the right anterior cerebral artery A1 segment. 8. Atherosclerotic plaque within the intracranial internal carotid arteries bilaterally with no more than mild stenosis. Electronically Signed: By: Kellie Simmering D.O. On: 04/04/2021 15:45     Medications   Scheduled Meds:  amLODipine  5 mg Oral Daily   [START ON 04/05/2021] clopidogrel  75 mg Oral Daily   enoxaparin (LOVENOX) injection  40 mg Subcutaneous Q24H   lactobacillus  1 g Oral BID   Continuous Infusions:  cefTRIAXone (ROCEPHIN)  IV Stopped (04/04/21 0310)       LOS: 2 days    Time spent: 50 minutes with > 50% spent at bedside and  in coordination of care     Ezekiel Slocumb, DO [facility]  04/04/2021, 5:07 PM      If 7PM-7AM, please contact night-coverage. How to contact the [facility] Attending or Consulting provider [place] or covering provider during after hours [place], for this patient?    Check the care team in [facility] and look for a) attending/consulting TR[facility] provider listed and b) the [facility] team listed Log into [email or web address] and use [place]'s universal password to access. If you do not have the password, please contact the hospital operator. Locate the [facility] provider you are looking for under [facility] and page to a number that you can be directly reached. If you still have difficulty reaching the provider, please page the [facility] (Director on Call) for the Hospitalists listed on amion for assistance.

## 2021-04-04 NOTE — Progress Notes (Signed)
Patient to [facility] via [facility]. Wife at bedside, will take all belongings. Report called to [employer]. Patient had large incont BM. Assisted w/ hygiene prior to stand/pivot to stretcher.

## 2021-04-04 NOTE — Progress Notes (Signed)
OT Cancellation Note  Patient Details Name: Nicholas Mckenzie MRN: [id number] DOB: May 04, 1946   Cancelled Treatment:    Reason Eval/Treat Not Completed: Patient at procedure or test/ unavailable patient is off unit getting MRI at this time. Will check back as schedule allows.  Sharyn Blitz OTR/L, MS Acute Rehabilitation Department Office# [phone] Pager# [phone]    04/04/2021, 2:36 PM

## 2021-04-04 NOTE — Consult Note (Addendum)
NEUROLOGY CONSULTATION NOTE   Date of service: April 04, 2021 Patient Name: Nicholas Mckenzie MRN:  [id number] DOB:  11-09-1945 Reason for consult: "Right PCA stroke" Requesting Provider: [name], DO _ _ _   _ __   _ __ _ _  __ __   _ __   __ _  History of Present Illness  Nicholas Mckenzie is a 75 y.o. male with PMH significant for CKD 3, OSA, HTN, Kidney stones, OSA on CPAP who presented to the ED with malaise, lightheaded, confusion, dysuria, nausea and vomiting. He is being treated for a Complicated UTI due to right ureteral stent obstruction. 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 This was not noted on CTH 2 days prior at the time of admission.  He was transferred to [facility] for further workup and stroke evaluation.  Denies any arm or leg weakness or numbness, no facial droop, no facial numbness, no speech issues, no slurred speech. Endorses difficulty with vision on the left, this started a few days ago.  mRS: 0 LKW: unknown tNK/thrombectomy: outside window NIHSS components Score: Comment  1a Level of Conscious 0[x]  1[]  2[]  3[]      1b LOC Questions 0[x]  1[]  2[]       1c LOC Commands 0[x]  1[]  2[]       2 Best Gaze 0[x]  1[]  2[]       3 Visual 0[]  1[]  2[x]  3[]    Left inferior quadrantanopsia  4 Facial Palsy 0[x]  1[]  2[]  3[]      5a Motor Arm - left 0[x]  1[]  2[]  3[]  4[]  UN[]    5b Motor Arm - Right 0[x]  1[]  2[]  3[]  4[]  UN[]    6a Motor Leg - Left 0[x]  1[]  2[]  3[]  4[]  UN[]    6b Motor Leg - Right 0[x]  1[]  2[]  3[]  4[]  UN[]    7 Limb Ataxia 0[x]  1[]  2[]  3[]  UN[]     8 Sensory 0[x]  1[]  2[]  UN[]      9 Best Language 0[x]  1[]  2[]  3[]      10 Dysarthria 0[x]  1[]  2[]  UN[]      11 Extinct. and Inattention 0[x]  1[]  2[]       TOTAL: 2      ROS   Constitutional Denies weight loss, endorses fever and chills.   HEENT Endorses changes in vision but not in hearing.   Respiratory Denies SOB and cough.   CV Denies palpitations  and CP   GI Denies abdominal pain, endorses nausea, vomiting but no diarrhea.  GU Denies dysuria and urinary frequency.   MSK Denies myalgia and joint pain.   Skin Denies rash and pruritus.   Neurological Denies headache and syncope.   Psychiatric Denies recent changes in mood. Denies anxiety and depression.    Past History   Past Medical History:  Diagnosis Date   CKD (chronic kidney disease) stage 3, GFR 30-59 ml/min (HCC)    Hypertension    Kidney stones    Sleep apnea    Past Surgical History:  Procedure Laterality Date   CATARACT EXTRACTION     kidney stent     MANDIBLE FRACTURE SURGERY     Family History  Problem Relation Age of Onset   Hypertension Mother    Hypertension Other    Social History   Socioeconomic History   Marital status: Married    Spouse name: Not on file   Number of children: Not on file   Years of education: Not on file  Highest education level: Not on file  Occupational History   Not on file  Tobacco Use   Smoking status: Never   Smokeless tobacco: Never  Substance and Sexual Activity   Alcohol use: Yes    Comment: Socially; twice a week   Drug use: Not on file   Sexual activity: Not on file  Other Topics Concern   Not on file  Social History Narrative   Not on file   Social Determinants of Health   Financial Resource Strain: Not on file  Food Insecurity: Not on file  Transportation Needs: Not on file  Physical Activity: Not on file  Stress: Not on file  Social Connections: Not on file   Allergies  Allergen Reactions   Statins Other (See Comments)    Pt reports severe weakness and mobility issues with his med, as well as diarrhea    Medications   Medications Prior to Admission  Medication Sig Dispense Refill Last Dose   acetaminophen (TYLENOL) 325 MG tablet Take 2 tablets (650 mg total) by mouth every 6 (six) hours as needed for mild pain (or Fever >/= 101). (Patient not taking: No sig reported)   Not Taking    amLODipine (NORVASC) 10 MG tablet Take 1 tablet (10 mg total) by mouth daily. (Patient not taking: No sig reported)   Not Taking   amoxicillin-clavulanate (AUGMENTIN) 875-125 MG tablet Take 1 tablet by mouth every 12 (twelve) hours. (Patient not taking: No sig reported)   Completed Course   hydrALAZINE (APRESOLINE) 25 MG tablet Take 1 tablet (25 mg total) by mouth every 8 (eight) hours. (Patient not taking: No sig reported) 90 tablet 0 Not Taking     Vitals   Vitals:   04/03/21 2157 04/04/21 0255 04/04/21 0611 04/04/21 1349  BP: (!) 141/75 132/77 (!) 142/77 137/80  Pulse: 92 85 89 93  Resp: 17 16 16 20   Temp: 99.9 F (37.7 C) 99.1 F (37.3 C) 99 F (37.2 C) (!) 100.4 F (38 C)  TempSrc: Oral Oral  Oral  SpO2: 99% 96% 97% 100%  Weight:      Height:         Body mass index is 31.66 kg/m.  Physical Exam   General: Laying comfortably in bed; in no acute distress.  HENT: Normal oropharynx and mucosa. Normal external appearance of ears and nose.  Neck: Supple, no pain or tenderness  CV: No JVD. No peripheral edema.  Pulmonary: Symmetric Chest rise. Normal respiratory effort.  Abdomen: Soft to touch, non-tender.  Ext: No cyanosis, edema, or deformity  Skin: No rash. Normal palpation of skin.   Musculoskeletal: Normal digits and nails by inspection. No clubbing.   Neurologic Examination  Mental status/Cognition: Alert, oriented to self, place, month and year, good attention.  Speech/language: Fluent, comprehension intact, object naming intact, repetition intact.  Cranial nerves:   CN II Pupils equal and reactive to light, Left inferior temporal quadrantanopsia   CN III,IV,VI EOM intact, no gaze preference or deviation, no nystagmus    CN V normal sensation in V1, V2, and V3 segments bilaterally    CN VII no asymmetry, no nasolabial fold flattening    CN VIII normal hearing to speech    CN IX & X normal palatal elevation, no uvular deviation    CN XI 5/5 head turn and 5/5  shoulder shrug bilaterally    CN XII midline tongue protrusion    Motor:  Muscle bulk: normal, tone normal, pronator drift none tremor none  Mvmt Root Nerve  Muscle Right Left Comments  SA C5/6 Ax Deltoid 5 5   EF C5/6 Mc Biceps 5 5   EE C6/7/8 Rad Triceps 5 5   WF C6/7 Med FCR     WE C7/8 PIN ECU     F Ab C8/T1 U ADM/FDI 5 5   HF L1/2/3 Fem Illopsoas 5 5   KE L2/3/4 Fem Quad 5 5   DF L4/5 D Peron Tib Ant 5 5   PF S1/2 Tibial Grc/Sol 5 5    Reflexes:  Right Left Comments  Pectoralis      Biceps (C5/6) 2 2   Brachioradialis (C5/6) 2 2    Triceps (C6/7) 2 2    Patellar (L3/4) 2 2    Achilles (S1)      Hoffman      Plantar     Jaw jerk    Sensation:  Light touch Intact throughout   Pin prick    Temperature    Vibration   Proprioception    Coordination/Complex Motor:  - Finger to Nose intact BL - Heel to shin intact BL - Rapid alternating movement are normal - Gait: Stride length normal. Arm swing none. Base width narrow.  Labs   CBC:  Recent Labs  Lab 04/01/21 2255 04/02/21 0559 04/03/21 0600 04/04/21 0311  WBC 12.0*   < > 15.7* 17.3*  NEUTROABS 10.6*  --   --   --   HGB 11.8*   < > 11.5* 10.9*  HCT 37.0*   < > 34.6* 32.8*  MCV 89.8   < > 85.9 85.0  PLT 340   < > 360 352   < > = values in this interval not displayed.    Basic Metabolic Panel:  Lab Results  Component Value Date   NA 140 04/04/2021   K 3.4 (L) 04/04/2021   CO2 23 04/04/2021   GLUCOSE 131 (H) 04/04/2021   BUN 29 (H) 04/04/2021   CREATININE 1.67 (H) 04/04/2021   CALCIUM 8.3 (L) 04/04/2021   GFRNONAA 43 (L) 04/04/2021   Lipid Panel: No results found for: LDLCALC HgbA1c: No results found for: HGBA1C Urine Drug Screen:     Component Value Date/Time   LABOPIA NONE DETECTED 03/10/2021 1927   COCAINSCRNUR NONE DETECTED 03/10/2021 1927   LABBENZ POSITIVE (A) 03/10/2021 1927   AMPHETMU NONE DETECTED 03/10/2021 1927   THCU NONE DETECTED 03/10/2021 1927   LABBARB NONE DETECTED  03/10/2021 1927    Alcohol Level     Component Value Date/Time   ETH <10 03/10/2021 1711    CT Head without contrast: Personally reviewed and notable for R PCA stroke.  CT angio Head and Neck with contrast: Personally reviewed and notable for multifocal multivessel intracranial stenosis. Specifically, also has R PCA P3 occlusion.  MRI Brain: pending Impression   Nicholas Mckenzie is a 75 y.o. male with PMH significant for CKD 3, OSA, HTN, Kidney stones, OSA on CPAP admitted for complicated UTI. 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 I suspect that his stroke is probably related to large artery atherosclerosis and the noted multifocal multivessel stenosis. Will get stroke workup to evaluate for causes and see what we can do to reduce future risks of strokes.  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    Secondary Diagnosis: Essential (primary) hypertension  Recommendations  Plan:  - Frequent Neuro checks per stroke unit protocol -  Recommend brain imaging with MRI Brain without contrast - Recommend obtaining TTE - Recommend obtaining Lipid panel with LDL - Please start statin if LDL > 70 - Recommend HbA1c - Antithrombotic - Aspirin 81mg  daily along with plavix 75mg  daily for 21 days followed by Aspirin 81mg  daily alone. - Recommend DVT ppx - SBP goal - permissive hypertension first 24 h < 220/110. Held home meds.  - Recommend Telemetry monitoring for arrythmia - Recommend bedside swallow screen prior to PO intake. - Stroke education booklet - Recommend PT/OT/SLP consult - Stroke team to follow along. - I discussed with patient and his wife that he should not drive given his vision deficit.  ________________________________________________  Plan discussed with patient and his wife at the bedside. I also discussed with Dr. with the overnight hospitalist team.   Thank you for  the opportunity to take part in the care of this patient. If you have any further questions, please contact the neurology consultation attending.  Signed,  [facility] Pager Number _ _ _   _ __   _ __ _ _  __ __   _ __   __ _

## 2021-04-04 NOTE — Progress Notes (Signed)
Inpatient Rehab Admissions Coordinator:   Pt was screened for CIR based on recommendations from PT.  Please note that [facility] Medicare is not in network with [facility].  Will need to seek acute inpatient rehab at an in-network facility.   Estill Dooms, PT, DPT Admissions Coordinator [phone] 04/04/21  4:51 PM

## 2021-04-04 NOTE — TOC Progression Note (Signed)
Transition of Care [facility]) - Progression Note   Patient Details  Name: Nicholas Mckenzie MRN: [id number] Date of Birth: 11/20/1945  [facility] CM/SW Contact  Ewing Schlein, LCSW Phone Number: 04/04/2021, 2:18 PM  Clinical Narrative: PT recommended HHPT. Wife is agreeable to referral for [facility]. CSW made referral to [facility] with [facility]. Wife updated. TOC to follow.  Expected Discharge Plan: Home w Home Health Services Barriers to Discharge: Continued Medical Work up  Expected Discharge Plan and Services Expected Discharge Plan: Home w Home Health Services In-house Referral: Clinical Social Work Post Acute Care Choice: Home Health Living arrangements for the past 2 months: Single Family Home          DME Arranged: N/A DME Agency: NA HH Arranged: PT HH Agency: [facility] Date HH Agency Contacted: 04/04/21 Time HH Agency Contacted: 1400 Representative spoke with at [facility] Agency: Pearson Grippe  Readmission Risk Interventions No flowsheet data found.

## 2021-04-04 NOTE — Evaluation (Addendum)
Occupational Therapy Evaluation Patient Details Name: Nicholas Mckenzie MRN: [id number] DOB: 25-Jun-1946 Today's Date: 04/04/2021   History of Present Illness 75 yo male admitted with AMS, UTI, weakness, fevers, R obstructed ureteral stent + bladder stone-urology following. Hx of chronic renal insufficiency, OSA.    Clinical Impression   Patient was previously living at home with wife with independence in ADLs with no AD. Currently, patient is noted to have new onset of L visual field cut with left eye deficits with moving medially. Patient was noted to have decreased coordination, decreased safety awareness, increased visual deficits, decreased sitting balance/tolerance, and decreased functional activity tolerance impacting participation in ADL tasks. Patient would continue to benefit from skilled OT services at this time while admitted and after d/c to address noted deficits in order to improve overall safety and independence in ADLs.       Recommendations for follow up therapy are one component of a multi-disciplinary discharge planning process, led by the attending physician.  Recommendations may be updated based on patient status, additional functional criteria and insurance authorization.   Follow Up Recommendations  CIR    Equipment Recommendations  Other (comment) (TBD)    Recommendations for Other Services       Precautions / Restrictions Precautions Precautions: Fall Precaution Comments: dizzness, nausea Restrictions Weight Bearing Restrictions: No      Mobility Bed Mobility Overal bed mobility: Needs Assistance Bed Mobility: Supine to Sit;Sit to Supine     Supine to sit: Mod assist Sit to supine: Mod assist;HOB elevated   General bed mobility comments: patient was able to sit on edge of bed for about 2 mins to attempt periphreal vision test with patient returning to supine without warning leaving BLE off edge of bed. patient was noted to have increased assistance with  mangement of BLE and BUE to preform tasks. patient declined to stand up at this time.    Transfers                      Balance Overall balance assessment: Needs assistance;History of Falls Sitting-balance support: Feet supported Sitting balance-Leahy Scale: Fair Sitting balance - Comments: patient with increased time sitting on edge of bed was noted to lean to R side Postural control: Right lateral lean                                 ADL either performed or assessed with clinical judgement   ADL                                               Vision Patient Visual Report: Other (comment) (patient reported having blurred vision and seeing almost double. patient reported having it for about a week) Vision Assessment?: Yes Ocular Range of Motion: Impaired-to be further tested in functional context Tracking/Visual Pursuits: Left eye does not track medially Visual Fields: Left visual field deficit Additional Comments: Patient was noted to have absent awareness of L peripheral field when tested. Patient was noted to correctly idenfity letters on the priner paper in front of patient with noted need for cues to become aware of letter on top L corner of paper. when cued to find it stating near therpist hand. patient turned head and looked down at therpist opposite hand at side. patient was noted  to have increased frustrations with testing on this date. in pointing to each letter identified patient was noted to point to item lower and more to R than where located on page. patients clock draw test is attached to this note. patient drew this with L hand with page held horizontally.     Perception     Praxis      Pertinent Vitals/Pain Pain Assessment: No/denies pain     Hand Dominance Right   Extremity/Trunk Assessment Upper Extremity Assessment Upper Extremity Assessment: LUE deficits/detail;RUE deficits/detail RUE Deficits / Details: noted to  have some strength deficits from evaluation 03/12/21 RUE Coordination: decreased fine motor;decreased gross motor LUE Deficits / Details: patient was noted to have some weakness with grasp on L side compared to R. patient was able to complete bilateral digit opposition with increased time. LUE Coordination: decreased gross motor;decreased fine motor           Communication Communication Communication: No difficulties   Cognition Arousal/Alertness: Awake/alert Behavior During Therapy: WFL for tasks assessed/performed Overall Cognitive Status: Impaired/Different from baseline                     Current Attention Level: Selective           General Comments: patient was able to follow commands with noted frustrations with each assessment item.   General Comments       Exercises     Shoulder Instructions      Home Living Family/patient expects to be discharged to:: Private residence Living Arrangements: Spouse/significant other Available Help at Discharge: Family Type of Home: House Home Access: Ramped entrance     Home Layout: One level     Bathroom Shower/Tub: Producer, television/film/video: Standard     Home Equipment: None          Prior Functioning/Environment Level of Independence: Independent                 OT Problem List: Decreased strength;Decreased range of motion;Decreased coordination;Impaired vision/perception;Decreased knowledge of use of DME or AE;Decreased knowledge of precautions;Decreased activity tolerance;Impaired balance (sitting and/or standing);Decreased safety awareness;Decreased cognition;Impaired UE functional use      OT Treatment/Interventions: Self-care/ADL training;Neuromuscular education;Therapeutic exercise;Energy conservation;DME and/or AE instruction;Therapeutic activities;Balance training;Patient/family education    OT Goals(Current goals can be found in the care plan section) Acute Rehab OT Goals Patient  Stated Goal: to rest OT Goal Formulation: With patient Time For Goal Achievement: 04/18/21 Potential to Achieve Goals: Good  OT Frequency: Min 2X/week   Barriers to D/C:                            AM-PAC OT "6 Clicks" Daily Activity     Outcome Measure Help from another person eating meals?: A Little Help from another person taking care of personal grooming?: A Lot Help from another person toileting, which includes using toliet, bedpan, or urinal?: Total Help from another person bathing (including washing, rinsing, drying)?: Total Help from another person to put on and taking off regular upper body clothing?: A Lot Help from another person to put on and taking off regular lower body clothing?: A Lot 6 Click Score: 11   End of Session Nurse Communication: Other (comment) (visual deficits and coordination issues identified during session)  Activity Tolerance:   Patient left: in bed;with call bell/phone within reach  OT Visit Diagnosis: Unsteadiness on feet (R26.81);Other symptoms and signs involving cognitive function;Muscle  weakness (generalized) (M62.81);Other abnormalities of gait and mobility (R26.89);History of falling (Z91.81)                Time: 1542-1600 OT Time Calculation (min): 18 min Charges:  OT General Charges $OT Visit: 1 Visit OT Evaluation $OT Eval High Complexity: 1 High  Sharyn Blitz OTR/L, MS Acute Rehabilitation Department Office# [phone] Pager# [phone]   [place] Guest Aleane Wesenberg 04/04/2021, 4:46 PM

## 2021-04-04 NOTE — Progress Notes (Signed)
Patient roomed to [facility] via [facility]. Vitals WNL and CHG bath given. CCMD called and second verified. Pt oriented to room.

## 2021-04-05 ENCOUNTER — Inpatient Hospital Stay (HOSPITAL_COMMUNITY): Payer: Medicare (Managed Care)

## 2021-04-05 ENCOUNTER — Encounter (HOSPITAL_COMMUNITY)
Admission: EM | Disposition: A | Discharge status: Rehabilitation Facility | Payer: Self-pay | Source: Home / Self Care | Attending: Internal Medicine

## 2021-04-05 DIAGNOSIS — I6389 Other cerebral infarction: Secondary | ICD-10-CM

## 2021-04-05 DIAGNOSIS — I10 Essential (primary) hypertension: Secondary | ICD-10-CM | POA: Diagnosis not present

## 2021-04-05 DIAGNOSIS — G9340 Encephalopathy, unspecified: Secondary | ICD-10-CM | POA: Diagnosis not present

## 2021-04-05 LAB — URINE CULTURE: Culture: >=100000 — AB

## 2021-04-05 LAB — BASIC METABOLIC PANEL
Anion gap: 11 (ref 5–15)
BUN: 24 mg/dL — ABNORMAL HIGH (ref 8–23)
CO2: 21 mmol/L — ABNORMAL LOW (ref 22–32)
Calcium: 8.5 mg/dL — ABNORMAL LOW (ref 8.9–10.3)
Chloride: 108 mmol/L (ref 98–111)
Creatinine, Ser: 1.6 mg/dL — ABNORMAL HIGH (ref 0.61–1.24)
GFR, Estimated: 45 mL/min — ABNORMAL LOW (ref >60)
Glucose, Bld: 124 mg/dL — ABNORMAL HIGH (ref 70–99)
Potassium: 3.4 mmol/L — ABNORMAL LOW (ref 3.5–5.1)
Sodium: 140 mmol/L (ref 135–145)

## 2021-04-05 LAB — CBC
HCT: 33 % — ABNORMAL LOW (ref 39.0–52.0)
Hemoglobin: 10.8 g/dL — ABNORMAL LOW (ref 13.0–17.0)
MCH: 28.1 pg (ref 26.0–34.0)
MCHC: 32.7 g/dL (ref 30.0–36.0)
MCV: 85.9 fL (ref 80.0–100.0)
Platelets: 323 10*3/uL (ref 150–400)
RBC: 3.84 MIL/uL — ABNORMAL LOW (ref 4.22–5.81)
RDW: 15.4 % (ref 11.5–15.5)
WBC: 15 10*3/uL — ABNORMAL HIGH (ref 4.0–10.5)
nRBC: 0 % (ref 0.0–0.2)

## 2021-04-05 LAB — ECHOCARDIOGRAM COMPLETE
Area-P 1/2: 2.09 cm2
Height: 68 in
S' Lateral: 3.3 cm
Weight: 3331.2 oz

## 2021-04-05 LAB — LIPID PANEL
Cholesterol: 82 mg/dL (ref 0–200)
HDL: <10 mg/dL — ABNORMAL LOW (ref >40)
Triglycerides: 91 mg/dL (ref <150)
VLDL: 18 mg/dL (ref 0–40)

## 2021-04-05 LAB — HEMOGLOBIN A1C
Hgb A1c MFr Bld: 6.1 % — ABNORMAL HIGH (ref 4.8–5.6)
Mean Plasma Glucose: 128.37 mg/dL

## 2021-04-05 IMAGING — MR MR HEAD W/O CM
13 of 14 series · 44 of 48 positions shown · non-contrast
Comparison: None.

CLINICAL DATA: Dizziness

EXAM:
MRI HEAD WITHOUT CONTRAST
TECHNIQUE: Multiplanar, multiecho pulse sequences of the brain and surrounding
structures were obtained without intravenous contrast.

[Series 5: DWI · axial · 3.0mm · 0.88mm/px · z∈[-37,+86]mm · 7 of 96 slices shown (1 of 4)]
[im 1/96]
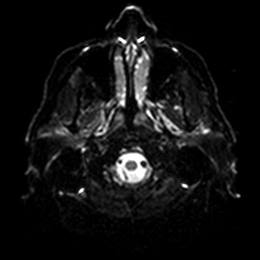
[im 16/96]
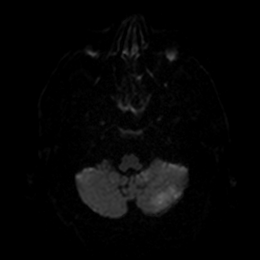
[im 32/96]
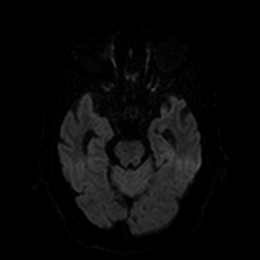
[im 48/96]
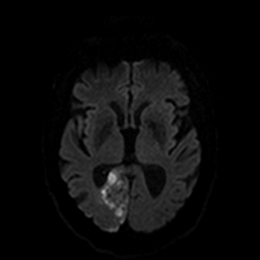
[im 64/96]
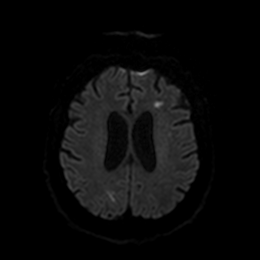
[im 80/96]
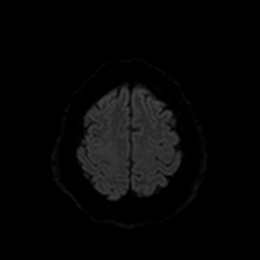
[im 96/96]
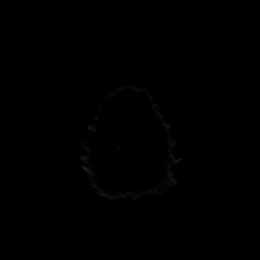

[Series 6: DWI · axial · 3.0mm · 0.88mm/px · z∈[-37,+86]mm · 4 of 48 slices shown (2 of 4)]
[im 1/48]
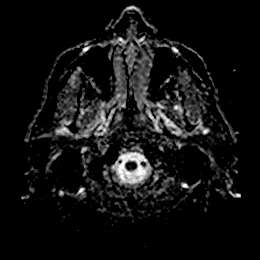
[im 16/48]
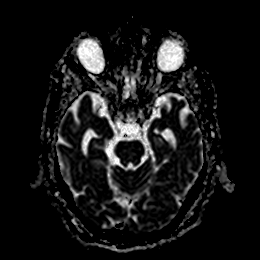
[im 32/48]
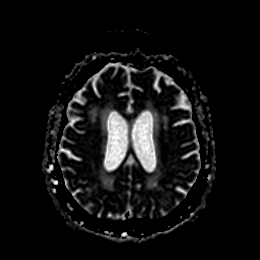
[im 48/48]
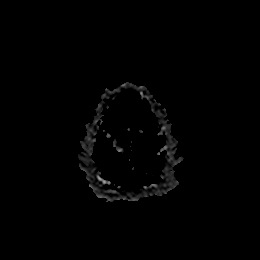

[Series 7: DWI · coronal · 4.0mm · 0.88mm/px · 5 of 68 slices shown (3 of 4)]
[im 1/68]
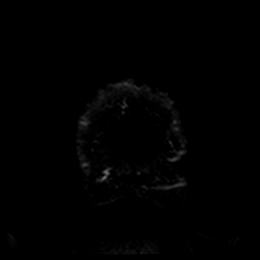
[im 17/68]
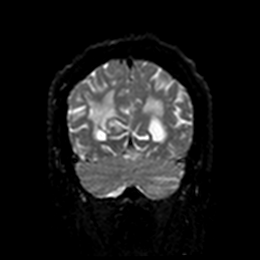
[im 34/68]
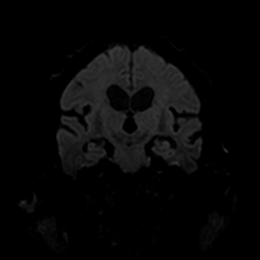
[im 51/68]
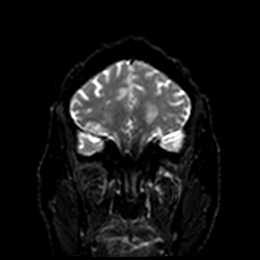
[im 68/68]
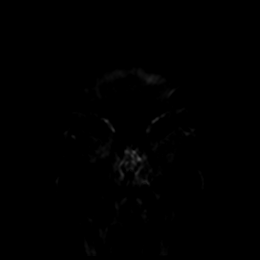

[Series 8: DWI · coronal · 4.0mm · 0.88mm/px · 3 of 34 slices shown (4 of 4)]
[im 1/34]
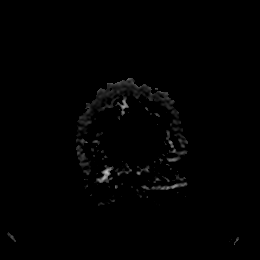
[im 17/34]
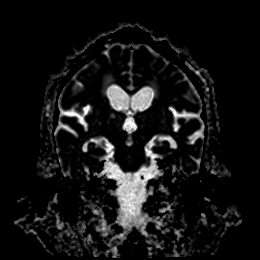
[im 34/34]
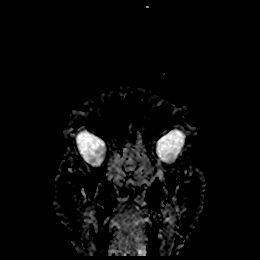

[Series 9: T2 · axial · 5.0mm · 0.72mm/px · z∈[-39,+88]mm · 2 of 25 slices shown (1 of 2)]
[im 1/25]
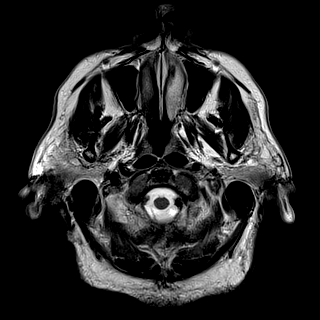
[im 25/25]
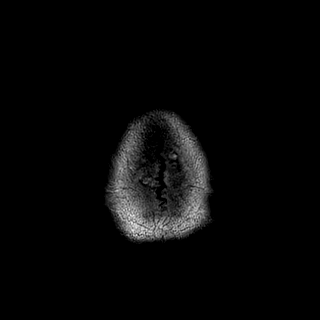

[Series 10: T1 · sagittal · 5.0mm · 0.75mm/px · 2 of 25 slices shown]
[im 1/25]
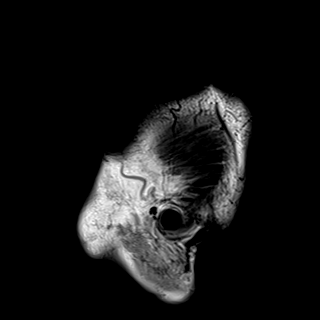
[im 25/25]
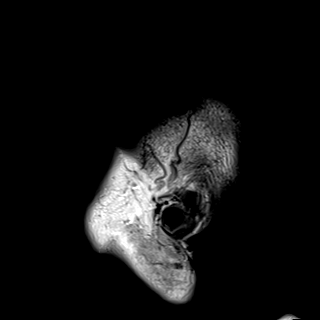

[Series 11: FLAIR · axial · 5.0mm · 0.45mm/px · z∈[-38,+89]mm · 2 of 25 slices shown (1 of 2)]
[im 1/25]
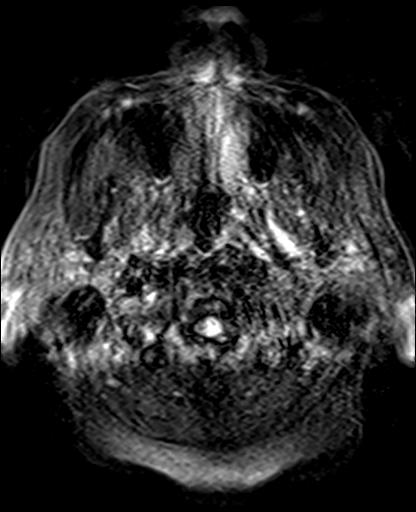
[im 25/25]
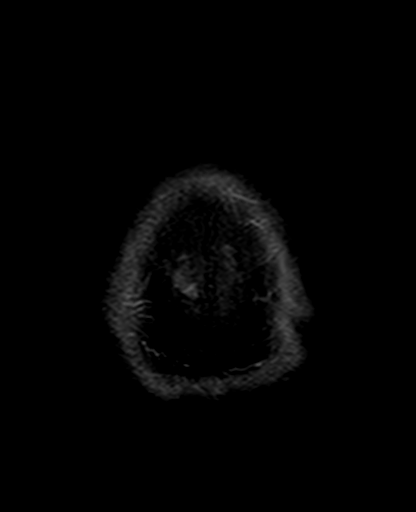

[Series 12: mag_images · axial · 3.0mm · 0.90mm/px · z∈[-42,+92]mm · 4 of 52 slices shown]
[im 1/52]
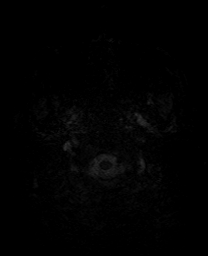
[im 18/52]
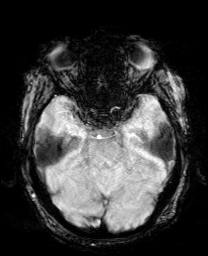
[im 35/52]
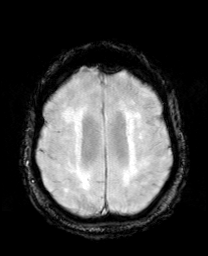
[im 52/52]
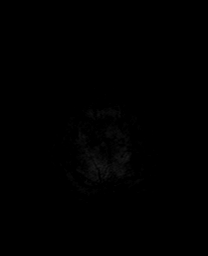

[Series 13: pha_images · axial · 3.0mm · 0.90mm/px · z∈[-42,+92]mm · 4 of 52 slices shown]
[im 1/52]
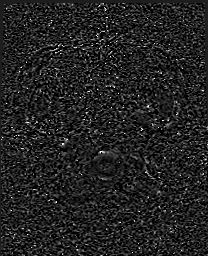
[im 18/52]
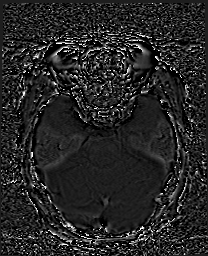
[im 35/52]
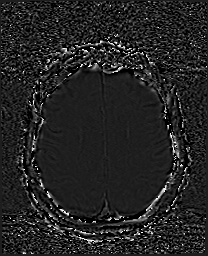
[im 52/52]
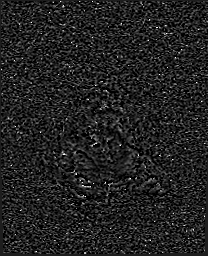

[Series 14: swi_images · axial · 3.0mm · 0.90mm/px · z∈[-42,+92]mm · 4 of 52 slices shown]
[im 1/52]
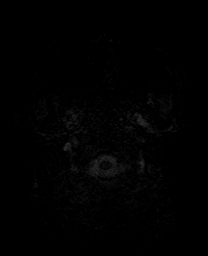
[im 18/52]
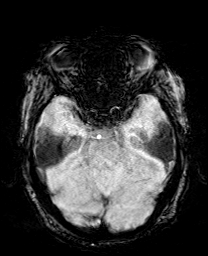
[im 35/52]
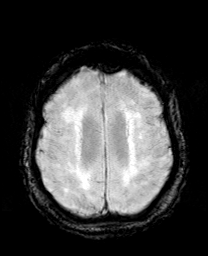
[im 52/52]
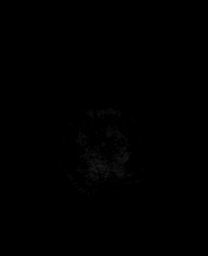

[Series 15: mip_images(sw) · axial · 24.0mm · 0.90mm/px · z∈[-32,+83]mm · 3 of 45 slices shown]
[im 1/45]
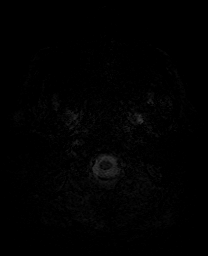
[im 23/45]
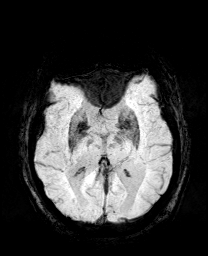
[im 45/45]
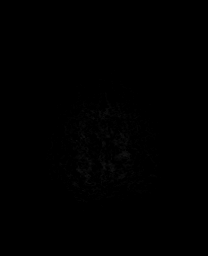

[Series 17: FLAIR · axial · 5.0mm · 0.90mm/px · z∈[-39,+88]mm · 2 of 25 slices shown (2 of 2)]
[im 1/25]
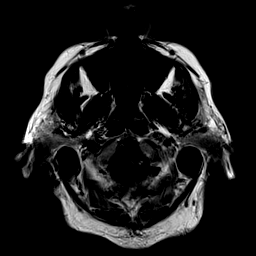
[im 25/25]
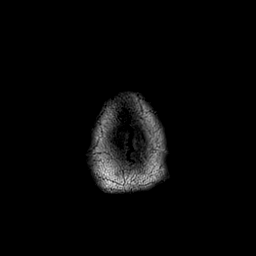

[Series 18: T2 · coronal · 5.0mm · 0.34mm/px · 2 of 29 slices shown (2 of 2)]
[im 1/29]
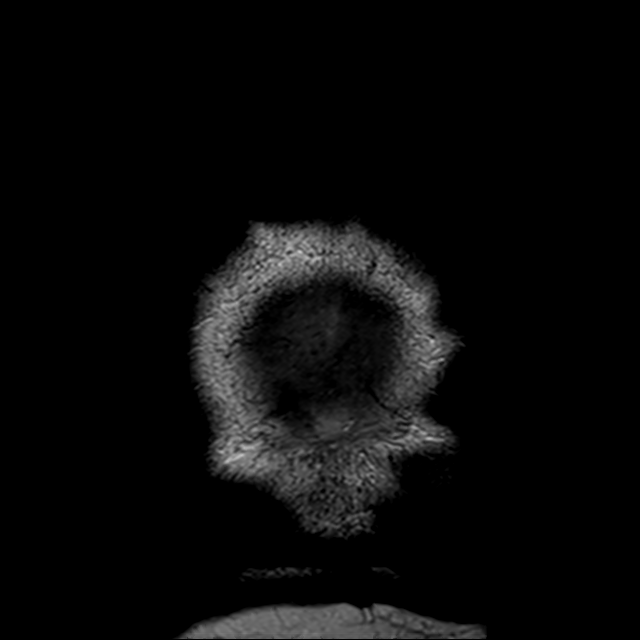
[im 29/29]
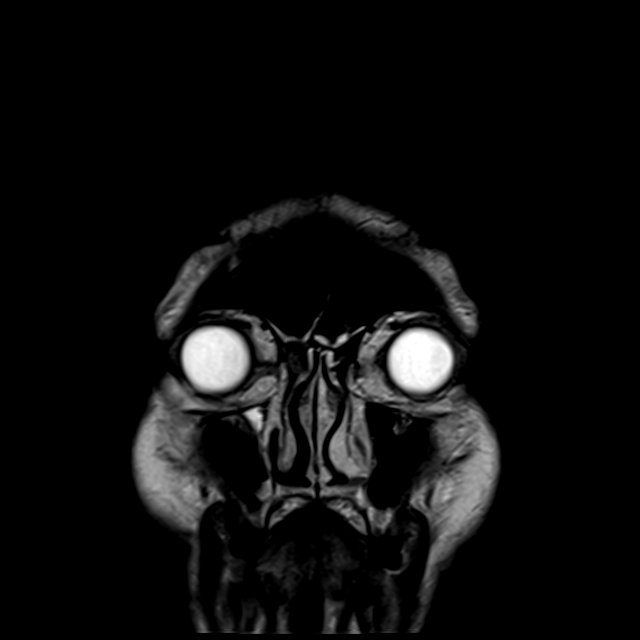

[44 of 48 positions shown; findings below may reference images not displayed]

FINDINGS: Brain: Multifocal abnormal diffusion restriction within both
cerebral hemispheres right worse than left, in the left middle
cerebellar peduncle and the inferior left cerebellum. No acute or
chronic hemorrhage. Hyperintense T2-weighted signal is moderately
widespread throughout the white matter. Generalized volume loss
without a clear lobar predilection. The midline structures are
normal.

Vascular: Major flow voids are preserved.

Skull and upper cervical spine: Normal calvarium and skull base.
Visualized upper cervical spine and soft tissues are normal.

Sinuses/Orbits:No paranasal sinus fluid levels or advanced mucosal
thickening. No mastoid or middle ear effusion. Normal orbits.
IMPRESSION: Multifocal acute ischemia within both cerebral hemispheres, right
worse than left. Ischemia in the left middle cerebellar peduncle and
inferior left cerebellum may be slightly older but is still in the
recent subacute phase. No hemorrhage or mass effect.

## 2021-04-05 SURGERY — NEPHROURETERECTOMY, ROBOT-ASSISTED, LAPAROSCOPIC
Anesthesia: General | Laterality: Right

## 2021-04-05 NOTE — Progress Notes (Addendum)
STROKE TEAM PROGRESS NOTE   INTERVAL HISTORY His wife is at the bedside.  He complains of visual loss affecting left eye. Otherwise he is pleasant and cooperative.  MRI shows a right PCA, left cerebellar and left periventricular white matter infarcts.  CT angiogram shows severe diffuse multivessel intracranial atherosclerosis.  LDL cannot be calculated.  Hemoglobin A1c 6.1.  He had fever and blood cultures are pending. Vitals:   04/05/21 0127 04/05/21 0130 04/05/21 0326 04/05/21 1124  BP:  139/71 121/74 (!) 148/87  Pulse:  74 74 83  Resp:  '18 18 20  ' Temp: 97.6 F (36.4 C) 97.6 F (36.4 C) 98.1 F (36.7 C) 98.1 F (36.7 C)  TempSrc: Oral Oral Oral Oral  SpO2:  96% 98% 97%  Weight:      Height:       CBC:  Recent Labs  Lab 04/01/21 2255 04/02/21 0559 04/04/21 0311 04/05/21 0200  WBC 12.0*   < > 17.3* 15.0*  NEUTROABS 10.6*  --   --   --   HGB 11.8*   < > 10.9* 10.8*  HCT 37.0*   < > 32.8* 33.0*  MCV 89.8   < > 85.0 85.9  PLT 340   < > 352 323   < > = values in this interval not displayed.   Basic Metabolic Panel:  Recent Labs  Lab 04/04/21 0311 04/05/21 0200  NA 140 140  K 3.4* 3.4*  CL 107 108  CO2 23 21*  GLUCOSE 131* 124*  BUN 29* 24*  CREATININE 1.67* 1.60*  CALCIUM 8.3* 8.5*  MG 1.9  --     Lipid Panel:  Recent Labs  Lab 04/05/21 0200  CHOL 82  TRIG 91  HDL <10*  CHOLHDL NOT CALCULATED  VLDL 18  LDLCALC NOT CALCULATED    HgbA1c:  Recent Labs  Lab 04/05/21 0200  HGBA1C 6.1*   Urine Drug Screen: No results for input(s): LABOPIA, COCAINSCRNUR, LABBENZ, AMPHETMU, THCU, LABBARB in the last 168 hours.  Alcohol Level No results for input(s): ETH in the last 168 hours.  IMAGING past 24 hours CT ANGIO HEAD W OR WO CONTRAST  Addendum Date: 04/04/2021   ADDENDUM REPORT: 04/04/2021 15:55 ADDENDUM: CT head impression 1, CTA neck impression 1 and CTA head impressions 1, 2 and 6 called by telephone at the time of interpretation on 04/04/2021 at 3:50 pm to  provider Dr. Arbutus Ped, who verbally acknowledged these results. Electronically Signed   By: Kellie Simmering D.O.   On: 04/04/2021 15:55   Result Date: 04/04/2021 CLINICAL DATA:  Vertigo, central. Additional history provided: Sinus headache, vertigo, confusion. EXAM: CT ANGIOGRAPHY HEAD AND NECK TECHNIQUE: Multidetector CT imaging of the head and neck was performed using the standard protocol during bolus administration of intravenous contrast. Multiplanar CT image reconstructions and MIPs were obtained to evaluate the vascular anatomy. Carotid stenosis measurements (when applicable) are obtained utilizing NASCET criteria, using the distal internal carotid diameter as the denominator. CONTRAST:  51m OMNIPAQUE IOHEXOL 350 MG/ML SOLN COMPARISON:  Head CT 04/02/2021. FINDINGS: CT HEAD FINDINGS Brain: Mild generalized cerebral atrophy. Acute/subacute appearing cortical/subcortical infarct within the right parietooccipital lobes and callosal splenium (right PCA vascular territory). No significant mass effect at this time. No evidence of hemorrhagic conversion. 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 Background moderate patchy and ill-defined hypoattenuation within the cerebral white matter, nonspecific but compatible with chronic small vessel ischemic disease. There is no acute intracranial hemorrhage. No extra-axial fluid collection. No evidence of  an intracranial mass. No midline shift. Vascular: Reported below. Skull: Normal. Negative for fracture or focal lesion. Sinuses: Postsurgical appearance of the paranasal sinuses. Small mucous retention cyst versus polypoid mucosal thickening within the left maxillary sinus. Orbits: No acute or significant orbital finding. Review of the MIP images confirms the above findings CTA NECK FINDINGS Aortic arch: Standard aortic branching. Atherosclerotic plaque within the proximal major branch vessels of the neck. No hemodynamically significant innominate or proximal  subclavian artery stenosis. Right carotid system: CCA and ICA patent within the neck without stenosis. Minimal soft and calcified plaque within the CCA. Left carotid system: CCA and ICA patent within the neck without stenosis. Minimal atherosclerotic plaque about the carotid bifurcation. Vertebral arteries: The non dominant right vertebral artery becomes occluded at the C1-skull base level. The dominant left vertebral artery is patent within the neck without stenosis. Skeleton: Partially imaged thoracic dextrocurvature. Multilevel bridging ventrolateral osteophytes with relative preservation of the intervertebral disc spaces. 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 No acute bony abnormality or aggressive osseous lesion. Other neck: 1.4 cm right thyroid lobe nodule, not meeting consensus criteria for ultrasound follow-up based on size. Upper chest: No consolidation within the imaged lung apices. Review of the MIP images confirms the above findings CTA HEAD FINDINGS Anterior circulation: The intracranial internal carotid arteries are patent. Calcified plaque within both vessels with no more than mild stenosis. There is a severe, near occlusive stenosis of the proximal to mid M1 left middle cerebral artery (series 14, image 57). The M1 right middle cerebral artery is patent with mild atherosclerotic irregularity. No M2 proximal branch occlusion or high-grade proximal stenosis is identified. The anterior cerebral arteries are patent. Moderate stenosis within the proximal right A1 segment. Posterior circulation: There is reconstitution of enhancement within the V4 right vertebral artery, likely due to retrograde flow. Enhancement is present within the proximal right PICA. Mild-to-moderate atherosclerotic narrowing of the V4 left vertebral artery. The basilar artery is patent. Severe stenosis of a right PCA branch at the P2/P3 junction. Occlusion of a right PCA branch at the P3 segment. The  left cerebral artery is fetal in origin and patent. However, there is a severe stenosis within the P2 left PCA. The right posterior communicating artery is diminutive or absent. Venous sinuses: Within the limitations of contrast timing, no convincing thrombus. Anatomic variants: As described. Review of the MIP images confirms the above findings IMPRESSION: CT head: 1. Acute/early subacute appearing right PCA territory cortical/subcortical infarct within the right parietooccipital lobes and callosal splenium. No significant mass effect at this time. No evidence of hemorrhagic conversion. 2. 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 3. Background moderate chronic small vessel ischemic changes within the cerebral white matter. 4. Mild generalized parenchymal atrophy. CTA neck: 1. The non-dominant right vertebral artery becomes occluded at the C1-skull base level. 2. The dominant left vertebral artery is patent throughout the neck without stenosis. 3. Common and internal carotid arteries patent within the neck bilaterally without stenosis. 4. 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 CTA head: 1. Occlusion of a P3 segment right posterior cerebral artery branch. 2. Severe stenosis of a right posterior cerebral artery branch at the P2/P3 junction. 3. Severe stenosis within the P2 segment of the left posterior cerebral artery. 4. Reconstitution of enhancement within the intracranial right vertebral artery, likely due to retrograde flow. 5. Mild/moderate stenosis within the V4 left vertebral artery. 6. 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 7. Moderate stenosis  of the right anterior cerebral artery A1 segment. 8. Atherosclerotic plaque within the intracranial internal carotid arteries bilaterally with no more than mild stenosis. Electronically Signed: By: Kellie Simmering D.O. On: 04/04/2021 15:45   CT ANGIO NECK W OR WO CONTRAST  Addendum Date:  04/04/2021   ADDENDUM REPORT: 04/04/2021 15:55 ADDENDUM: CT head impression 1, CTA neck impression 1 and CTA head impressions 1, 2 and 6 called by telephone at the time of interpretation on 04/04/2021 at 3:50 pm to provider Dr. Arbutus Ped, who verbally acknowledged these results. Electronically Signed   By: Kellie Simmering D.O.   On: 04/04/2021 15:55   Result Date: 04/04/2021 CLINICAL DATA:  Vertigo, central. Additional history provided: Sinus headache, vertigo, confusion. EXAM: CT ANGIOGRAPHY HEAD AND NECK TECHNIQUE: Multidetector CT imaging of the head and neck was performed using the standard protocol during bolus administration of intravenous contrast. Multiplanar CT image reconstructions and MIPs were obtained to evaluate the vascular anatomy. Carotid stenosis measurements (when applicable) are obtained utilizing NASCET criteria, using the distal internal carotid diameter as the denominator. CONTRAST:  81m OMNIPAQUE IOHEXOL 350 MG/ML SOLN COMPARISON:  Head CT 04/02/2021. FINDINGS: CT HEAD FINDINGS Brain: Mild generalized cerebral atrophy. Acute/subacute appearing cortical/subcortical infarct within the right parietooccipital lobes and callosal splenium (right PCA vascular territory). No significant mass effect at this time. No evidence of hemorrhagic conversion. 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 Background moderate patchy and ill-defined hypoattenuation within the cerebral white matter, nonspecific but compatible with chronic small vessel ischemic disease. There is no acute intracranial hemorrhage. No extra-axial fluid collection. No evidence of an intracranial mass. No midline shift. Vascular: Reported below. Skull: Normal. Negative for fracture or focal lesion. Sinuses: Postsurgical appearance of the paranasal sinuses. Small mucous retention cyst versus polypoid mucosal thickening within the left maxillary sinus. Orbits: No acute or significant orbital finding. Review of the MIP images  confirms the above findings CTA NECK FINDINGS Aortic arch: Standard aortic branching. Atherosclerotic plaque within the proximal major branch vessels of the neck. No hemodynamically significant innominate or proximal subclavian artery stenosis. Right carotid system: CCA and ICA patent within the neck without stenosis. Minimal soft and calcified plaque within the CCA. Left carotid system: CCA and ICA patent within the neck without stenosis. Minimal atherosclerotic plaque about the carotid bifurcation. Vertebral arteries: The non dominant right vertebral artery becomes occluded at the C1-skull base level. The dominant left vertebral artery is patent within the neck without stenosis. Skeleton: Partially imaged thoracic dextrocurvature. Multilevel bridging ventrolateral osteophytes with relative preservation of the intervertebral disc spaces. 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 No acute bony abnormality or aggressive osseous lesion. Other neck: 1.4 cm right thyroid lobe nodule, not meeting consensus criteria for ultrasound follow-up based on size. Upper chest: No consolidation within the imaged lung apices. Review of the MIP images confirms the above findings CTA HEAD FINDINGS Anterior circulation: The intracranial internal carotid arteries are patent. Calcified plaque within both vessels with no more than mild stenosis. There is a severe, near occlusive stenosis of the proximal to mid M1 left middle cerebral artery (series 14, image 57). The M1 right middle cerebral artery is patent with mild atherosclerotic irregularity. No M2 proximal branch occlusion or high-grade proximal stenosis is identified. The anterior cerebral arteries are patent. Moderate stenosis within the proximal right A1 segment. Posterior circulation: There is reconstitution of enhancement within the V4 right vertebral artery, likely due to retrograde flow. Enhancement is present within the proximal right PICA.  Mild-to-moderate  atherosclerotic narrowing of the V4 left vertebral artery. The basilar artery is patent. Severe stenosis of a right PCA branch at the P2/P3 junction. Occlusion of a right PCA branch at the P3 segment. The left cerebral artery is fetal in origin and patent. However, there is a severe stenosis within the P2 left PCA. The right posterior communicating artery is diminutive or absent. Venous sinuses: Within the limitations of contrast timing, no convincing thrombus. Anatomic variants: As described. Review of the MIP images confirms the above findings IMPRESSION: CT head: 1. Acute/early subacute appearing right PCA territory cortical/subcortical infarct within the right parietooccipital lobes and callosal splenium. No significant mass effect at this time. No evidence of hemorrhagic conversion. 2. 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 3. Background moderate chronic small vessel ischemic changes within the cerebral white matter. 4. Mild generalized parenchymal atrophy. CTA neck: 1. The non-dominant right vertebral artery becomes occluded at the C1-skull base level. 2. The dominant left vertebral artery is patent throughout the neck without stenosis. 3. Common and internal carotid arteries patent within the neck bilaterally without stenosis. 4. 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 CTA head: 1. Occlusion of a P3 segment right posterior cerebral artery branch. 2. Severe stenosis of a right posterior cerebral artery branch at the P2/P3 junction. 3. Severe stenosis within the P2 segment of the left posterior cerebral artery. 4. Reconstitution of enhancement within the intracranial right vertebral artery, likely due to retrograde flow. 5. Mild/moderate stenosis within the V4 left vertebral artery. 6. 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 7. Moderate stenosis of the right anterior cerebral artery A1 segment. 8. Atherosclerotic  plaque within the intracranial internal carotid arteries bilaterally with no more than mild stenosis. Electronically Signed: By: Kellie Simmering D.O. On: 04/04/2021 15:45   MR BRAIN WO CONTRAST  Result Date: 04/05/2021 CLINICAL DATA:  Dizziness EXAM: MRI HEAD WITHOUT CONTRAST TECHNIQUE: Multiplanar, multiecho pulse sequences of the brain and surrounding structures were obtained without intravenous contrast. COMPARISON:  None. FINDINGS: Brain: Multifocal abnormal diffusion restriction within both cerebral hemispheres right worse than left, in the left middle cerebellar peduncle and the inferior left cerebellum. No acute or chronic hemorrhage. Hyperintense T2-weighted signal is moderately widespread throughout the white matter. Generalized volume loss without a clear lobar predilection. The midline structures are normal. Vascular: Major flow voids are preserved. Skull and upper cervical spine: Normal calvarium and skull base. Visualized upper cervical spine and soft tissues are normal. Sinuses/Orbits:No paranasal sinus fluid levels or advanced mucosal thickening. No mastoid or middle ear effusion. Normal orbits. 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 Ischemia in the left middle cerebellar peduncle and inferior left cerebellum may be slightly older but is still in the recent subacute phase. No hemorrhage or mass effect. Electronically Signed   By: Ulyses Jarred M.D.   On: 04/05/2021 03:19    PHYSICAL EXAM Pleasant elderly Caucasian male not in distress. . Afebrile. Head is nontraumatic. Neck is supple without bruit.    Cardiac exam no murmur or gallop. Lungs are clear to auscultation. Distal pulses are well felt.   Mental status/Cognition: Alert, oriented to self, place, month and year, good attention.  Diminished attention and recall. Speech/language: Fluent, comprehension intact, object naming intact, repetition intact.  Cranial nerves:   CN II Pupils  equal and reactive to light, Left inferior temporal quadrantanopsia   CN III,IV,VI EOM intact, no gaze preference or deviation, no nystagmus    CN  V normal sensation in V1, V2, and V3 segments bilaterally    CN VII no asymmetry, no nasolabial fold flattening    CN VIII normal hearing to speech    CN IX & X normal palatal elevation, no uvular deviation    CN XI 5/5 head turn and 5/5 shoulder shrug bilaterally    CN XII midline tongue protrusion     motor: muscle bulk normal, tone normal, no pronator drift seen. Strength: 5/5 throughout  Sensation: intact throughout to light touch.   ASSESSMENT/PLAN [name] is a 75 y.o. male with history of   CKD 3, OSA, HTN, Kidney stones, OSA on CPAP who presented to the ED with malaise, lightheaded, confusion, dysuria, nausea and vomiting. He is being treated for a Complicated UTI due to right ureteral stent obstruction. 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 This was not noted on CTH 2 days prior at the time of admission.   He was transferred to [facility] for further workup and stroke evaluation.   Denies any arm or leg weakness or numbness, no facial droop, no facial numbness, no speech issues, no slurred speech. Endorses difficulty with vision on the left, this started a few days ago.   MRI shows a right PCA territory stroke.   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 No significant mass effect at this time. No evidence of hemorrhagic conversion. 2. 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 3. Background moderate chronic small vessel ischemic changes within the cerebral white matter. 4. Mild generalized parenchymal atrophy.  CTA head   1. Occlusion of a P3 segment  right posterior cerebral artery branch. 2. Severe stenosis of a right posterior cerebral artery branch at the P2/P3 junction. 3. Severe stenosis within the P2 segment of the left posterior cerebral artery. 4. Reconstitution of enhancement within the intracranial right vertebral artery, likely due to retrograde flow. 5. Mild/moderate stenosis within the V4 left vertebral artery. 6. 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 7. Moderate stenosis of the right anterior cerebral artery A1 segment. 8. Atherosclerotic plaque within the intracranial internal carotid arteries bilaterally with no more than mild stenosis.   CTA NECK 1. The non-dominant right vertebral artery becomes occluded at the C1-skull base level. 2. The dominant left vertebral artery is patent throughout the neck without stenosis. 3. Common and internal carotid arteries patent within the neck bilaterally without stenosis. 4. 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  MRI   Multifocal acute ischemia within both cerebral hemispheres, right worse than left. Ischemia in the left middle cerebellar peduncle and inferior left cerebellum may be slightly older but is still in the recent subacute phase. No hemorrhage or mass effect.   LDL NOT CALCULATED HgbA1c 6.1 VTE prophylaxis - SCD    Diet   DIET DYS 2 Room service appropriate? Yes; Fluid consistency: Thin   No antithrombotic prior to admission, now on aspirin 81 mg daily and clopidogrel 75 mg daily.  For 3 months followed by aspirin alone Therapy recommendations:  CIR Disposition:  pending  Hypertension Home meds:  amlodipine Stable Permissive hypertension (OK if < 220/120) but gradually normalize in 5-7 days Long-term BP goal normotensive  Hyperlipidemia Home meds:  no  LDL NOT CALCULATED, goal <  70 Results for DARIUSH, MCNELLIS (MRN [id number]) as of 04/05/2021 12:03  Ref. Range 04/05/2021 02:00  Total  CHOL/HDL Ratio Latest Units: RATIO NOT CALCULATED  Cholesterol Latest Ref Range: 0 - 200 mg/dL 82  HDL Cholesterol Latest Ref Range: >40 mg/dL <10 (L)  LDL (calc) Latest Ref Range: 0 - 99 mg/dL NOT CALCULATED  Triglycerides Latest Ref Range: <150 mg/dL 91  VLDL Latest Ref Range: 0 - 40 mg/dL 18   HgbA1c 6.1, goal < 7.0 CBGs No results for input(s): GLUCAP in the last 72 hours.  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 CKD IIIa  - SCr is 1.63 on admission, up from last month in setting of hypovolemia  - Continue IVF hydration, renally-dose medications, monitor     4. OSA  - Continue CPAP qHS     5. Hypertension  - BP well controlled at home off of antihypertensives for the past couple weeks  - Treat as-needed only for now     6. Depression  - Hold Prozac initially in light of somnolence and diarrhea    7. N/V/D  - Abd exam benign and no bowel wall thickening or inflammatory change on CT  - Denies N/V/D since arrival in ED 8 hrs ago  - Continue IVF hydration, monitor electrolytes     8. Elevated transaminases  - Transaminases slightly elevated with normal alk phos and bilirubin  - No acute hepatobiliary abnormality on CT in ED  - Hold statin initially and trend        Hospital day # 3   I have personally  obtained history,examined this patient, reviewed notes, independently viewed imaging studies, participated in medical decision making and plan of care.ROS completed by me personally and pertinent positives fully documented  I have made any additions or clarifications directly to the above note. Agree with note above.  She presented with confusion, headache and vertigo and MRI shows right PCA, left cerebellar and left periventricular infarcts with CT angiogram showing diffuse multivessel intracranial atherosclerosis.  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  Recommend dual antiplatelet therapy aspirin Plavix for 3 months followed by aspirin alone.  Along with aggressive risk factor modification.  Patient advised not to drive till his vision improves.  Continue ongoing stroke work-up and therapy consults.  Check blood cultures and if positive may need a TEE to look for endocarditis.  Treatment of UTI as per primary team.  Discussed with Dr. Avon Gully and patient's wife and answered questions greater than 50% time during the study family does not spent on counseling and coordination of care and discussion with care team and answering questions.  Antony Contras, MD Medical Director [facility] Pager: [phone] 04/05/2021 4:05 PM   To contact Stroke Continuity provider, please refer to [email or web address]  After hours, contact General Neurology

## 2021-04-05 NOTE — Progress Notes (Signed)
RT set up CPAP and placed on patient. Patient tolerating settings well at this time. RT will monitor as needed.  

## 2021-04-05 NOTE — Evaluation (Signed)
Clinical/Bedside Swallow Evaluation Patient Details  Name: Nicholas Mckenzie MRN: [id number] Date of Birth: [birth year]  Today's Date: 04/05/2021 Time: SLP Start Time (ACUTE ONLY): 1035 SLP Stop Time (ACUTE ONLY): 1100 SLP Time Calculation (min) (ACUTE ONLY): 25 min  Past Medical History:  Past Medical History:  Diagnosis Date   CKD (chronic kidney disease) stage 3, GFR 30-59 ml/min (HCC)    Hypertension    Kidney stones    Sleep apnea    Past Surgical History:  Past Surgical History:  Procedure Laterality Date   CATARACT EXTRACTION     kidney stent     MANDIBLE FRACTURE SURGERY     HPI:  75yo male admitted 04/01/21 with malaise, lightheadedness, confusion, dysuria, n/v, UTI d/t R ureteral stent obstruction (surgery now on hold due to infection). 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 PMH: CKD3, HTN, kidney stones, OSA    Assessment / Plan / Recommendation  Clinical Impression  Pt seen at bedside for assessment of swallow function and safety, and to identify least restrictive diet. Pt presents with adequate natural dentition. CN exam is remarkable for slight left facial weakness, and mild lingual weakness resulting in mildly dysarthric (but intelligible) speech. Pt was able to complete oral care after set up, after which pt accepted trials of ice chips, water, juice, puree, and solid textures. Oropharyngeal swallow appears WFL, with no obvious oral issues or overt s/s aspiration observed. Pt exhibited difficulty self feeding puree with a spoon, possibly due to visual issues. Given altered mentation and vision, recommend a more conservative diet of dys 2/thin liquids to begin. Anticipate being able to advance solid textures quickly with improvement in cognition. SLP will follow to assess diet tolerance and readiness to advance textures. RN and MD informed. Safe swallow precautions posted at [facility].  SLP Visit Diagnosis: Dysphagia, unspecified (R13.10)    Aspiration Risk  Mild aspiration risk;Moderate  aspiration risk    Diet Recommendation Thin liquid;Dysphagia 2 (Fine chop)   Liquid Administration via: Straw Medication Administration: Whole meds with liquid Supervision: Patient able to self feed;Full supervision/cueing for compensatory strategies;Staff to assist with self feeding Compensations: Slow rate;Small sips/bites;Minimize environmental distractions Postural Changes: Seated upright at 90 degrees    Other  Recommendations Oral Care Recommendations: Oral care BID    Recommendations for follow up therapy are one component of a multi-disciplinary discharge planning process, led by the attending physician.  Recommendations may be updated based on patient status, additional functional criteria and insurance authorization.  Follow up Recommendations Other (comment) (TBD)      Frequency and Duration min 1 x/week  1 week;2 weeks       Prognosis Prognosis for Safe Diet Advancement: Good Barriers to Reach Goals: Cognitive deficits      Swallow Study   General Date of Onset: 04/05/21 HPI: 74yo male admitted 04/01/21 with malaise, lightheadedness, confusion, dysuria, n/v, UTI d/t R ureteral stent obstruction (surgery now on hold due to infection). 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 PMH: CKD3, HTN, kidney stones, OSA Type of Study: Bedside Swallow Evaluation Previous Swallow Assessment: none Diet Prior to this Study: NPO Temperature Spikes Noted: No Respiratory Status: Room air History of Recent Intubation: No Behavior/Cognition: Alert;Cooperative;Pleasant mood Oral Cavity Assessment: Within Functional Limits Oral Care Completed by SLP: Yes Oral Cavity - Dentition: Adequate natural dentition Vision: Functional for self-feeding Self-Feeding Abilities: Able to feed self Patient Positioning: Upright in bed Baseline Vocal Quality: Normal Volitional Cough: Weak Volitional Swallow: Able to elicit    Oral/Motor/Sensory Function Overall Oral  Motor/Sensory Function: Mild  impairment Facial ROM: Reduced left Facial Symmetry: Abnormal symmetry left Facial Strength: Reduced left Facial Sensation: Within Functional Limits Lingual ROM: Within Functional Limits Lingual Symmetry: Within Functional Limits Lingual Strength: Reduced Lingual Sensation: Within Functional Limits Velum: Within Functional Limits Mandible: Within Functional Limits   Ice Chips Ice chips: Within functional limits Presentation: Spoon   Thin Liquid Thin Liquid: Within functional limits Presentation: Straw    Nectar Thick Nectar Thick Liquid: Within functional limits Presentation: Straw;Self Fed   Honey Thick Honey Thick Liquid: Not tested   Puree Puree: Within functional limits Presentation: Spoon;Self Fed Other Comments: difficulty noted with self feeding, possibly due to visual issues   Solid     Solid: Within functional limits Presentation: Self Fed     Nicholas Mckenzie B. Murvin Natal, [facility], CCC-SLP Speech Language Pathologist Office: [phone]  [name] 04/05/2021,11:07 AM

## 2021-04-05 NOTE — Progress Notes (Signed)
Echocardiogram 2D Echocardiogram has been performed.  Warren Lacy Neeko Pharo RDCS 04/05/2021, 4:02 PM

## 2021-04-05 NOTE — Progress Notes (Signed)
PROGRESS NOTE    Nicholas Mckenzie  ZOX:[id number] DOB: December 16, 1945 DOA: 04/01/2021 PCP: Eartha Inch, MD   Brief Narrative:  75 y.o. male with medical history significant for hypertension, nephrolithiasis, OSA on CPAP, chronic renal insufficiency, and depression, now presenting to emergency department for evaluation of fatigue, lightheadedness, dysuria, confusion, nausea, vomiting, and loose stools. Previously discharged 3 weeks ago from polypharmacy/accidental overdose. Who present with worsening malaise/weakness found to have UTI in the setting of R ureteral stent obstruction. Urology is consulted and following.  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  Defer to urology and neurology for further timing in the case given acute CVA as above    Acute metabolic encephalopathy -Likely multifactorial in the setting of infection, complicated by acute stroke given discussion with wife that patient had intermittent and worsening confusion up to 10 days prior to admission  Nausea vomiting diarrhea  -Reported prior to admission, no further signs or symptoms of nausea vomiting  Transaminitis, resolved -Likely in setting of poor p.o. intake -LFTs downtrending appropriately   CKD stage IIIa -Resolving with IV fluids, increase p.o. intake as  appropriate  Obstructive sleep apnea -continue CPAP nightly  Essential hypertension -BP stable upon admission while patient had been off antihypertensives at home for couple weeks.  Treat as needed for now.  Depression -Prozac held on admission due to somnolence     Obesity: Body mass index is 31.66 kg/m.  Complicates overall care and prognosis.  Recommend lifestyle modifications including physical activity and diet for weight loss and overall long-term health.    DVT prophylaxis: Lovenox Code Status: Full Family Communication: Wife updated at bedside, lengthy discussion about current infection, stroke possible need for procedure and further imaging  Status is: Inpatient  Dispo: The patient is from: Home              Anticipated d/c is to: CIR              Anticipated d/c date is: > 72 hours              Patient currently not medically stable for discharge  Consultants:  Neurology, neurology  Antimicrobials:  Ceftriaxone  Subjective: No acute issues or events overnight per staff or wife at bedside, patient remains somnolent but arousable, review of systems markedly limited  Objective: Vitals:   04/05/21 0000 04/05/21 0127 04/05/21 0130 04/05/21 0326  BP: 110/65  139/71 121/74  Pulse:   74 74  Resp:   18 18  Temp:  97.6 F (36.4 C) 97.6 F (36.4 C) 98.1 F (36.7 C)  TempSrc:  Oral Oral Oral  SpO2:   96% 98%  Weight:      Height:        Intake/Output Summary (Last 24 hours) at 04/05/2021 0738 Last data filed at 04/04/2021 1858 Gross per 24 hour  Intake --  Output 650 ml  Net -650 ml   Filed Weights   04/02/21 1500 04/03/21 1957 04/03/21 2000  Weight: (P) 94.1 kg 95.7 kg 94.4 kg    Examination:  General exam: Appears calm and comfortable  Respiratory system: Clear to auscultation. Respiratory effort normal. Cardiovascular system: S1 & S2 heard, RRR. No JVD, murmurs, rubs, gallops or clicks. No pedal edema. Gastrointestinal system: Abdomen is  nondistended, soft and nontender. No organomegaly or masses felt. Normal bowel sounds heard. Central nervous system: Alert and oriented. No focal neurological deficits. Extremities: Symmetric 5 x 5 power. Skin: No rashes, lesions or ulcers Psychiatry: Judgement and insight appear normal. Mood & affect appropriate.     Data Reviewed: I have personally reviewed following labs and imaging studies  CBC: Recent Labs  Lab 04/01/21 2255 04/02/21 0559 04/03/21 0600 04/04/21 0311 04/05/21 0200  WBC 12.0* 8.7 15.7* 17.3* 15.0*  NEUTROABS 10.6*  --   --   --   --   HGB 11.8* 11.7* 11.5* 10.9* 10.8*  HCT 37.0* 36.2* 34.6* 32.8* 33.0*  MCV 89.8 88.7 85.9 85.0 85.9  PLT 340 403* 360 352 323   Basic Metabolic Panel: Recent Labs  Lab 04/01/21 2255 04/02/21 0559 04/03/21 0600 04/04/21 0311 04/05/21 0200  NA 143 144 140 140 140  K 3.5 3.6 3.7 3.4* 3.4*  CL 111 107 108 107 108  CO2 21*  GLUCOSE 134* 117* 126* 131* 124*  BUN 39* 37* 31* 29* 24*  CREATININE 1.63* 1.59* 1.51* 1.67* 1.60*  CALCIUM 8.7* 9.2 8.7* 8.3* 8.5*  MG  --   --   --  1.9  --    GFR: Estimated Creatinine Clearance: 45.8 mL/min (A) (by C-G formula based on SCr of 1.6 mg/dL (H)). Liver Function Tests: Recent Labs  Lab 04/01/21 2255 04/02/21 0559 04/03/21 0600 04/04/21 0311  AST 42* 32 32 26  ALT 46* 41 42 35  ALKPHOS 75 74 70 71  BILITOT 0.8 0.6 0.4 0.7  PROT 6.2* 6.2* 5.7* 5.8*  ALBUMIN 2.2* 2.3* 1.9* 2.1*   Recent Labs  Lab 04/01/21 2255  LIPASE 40   No results for input(s): AMMONIA in the last 168 hours. Coagulation Profile: No results for input(s): INR, PROTIME in the last 168 hours. Cardiac Enzymes: No results for input(s): CKTOTAL, CKMB, CKMBINDEX, TROPONINI in the last 168 hours. BNP (last 3 results) No results for input(s): PROBNP in the last 8760 hours. HbA1C: Recent Labs    04/05/21 0200  HGBA1C 6.1*   CBG: No results for input(s): GLUCAP in the last 168 hours. Lipid  Profile: Recent Labs    04/05/21 0200  CHOL 82  HDL <10*  LDLCALC NOT CALCULATED  TRIG 91  CHOLHDL NOT CALCULATED   Thyroid Function Tests: No results for input(s): TSH, T4TOTAL, FREET4, T3FREE, THYROIDAB in the last 72 hours. Anemia Panel: No results for input(s): VITAMINB12, FOLATE, FERRITIN, TIBC, IRON, RETICCTPCT in the last 72 hours. Sepsis Labs: Recent Labs  Lab 04/01/21 2255 04/02/21 0200  LATICACIDVEN 1.3 1.0    Recent Results (from the past 240 hour(s))  Resp Panel by RT-PCR (Flu A&B, Covid) Nasopharyngeal Swab     Status: None   Collection Time: 04/01/21  8:00 PM   Specimen: Nasopharyngeal Swab; Nasopharyngeal(NP) swabs in vial transport medium  Result Value Ref Range Status   SARS Coronavirus 2 by RT PCR NEGATIVE NEGATIVE Final    Comment: (NOTE) SARS-CoV-2 target nucleic acids are NOT DETECTED.  The SARS-CoV-2 RNA is generally detectable in upper respiratory specimens during the acute phase of infection. The lowest concentration of SARS-CoV-2 viral copies  this assay can detect is 138 copies/mL. A negative result does not preclude SARS-Cov-2 infection and should not be used as the sole basis for treatment or other patient management decisions. A negative result may occur with  improper specimen collection/handling, submission of specimen other than nasopharyngeal swab, presence of viral mutation(s) within the areas targeted by this assay, and inadequate number of viral copies(<138 copies/mL). A negative result must be combined with clinical observations, patient history, and epidemiological information. The expected result is Negative.  Fact Sheet for Patients:  BloggerCourse.com  Fact Sheet for Healthcare Providers:  SeriousBroker.it  This test is no t yet approved or cleared by the [place] FDA and  has been authorized for detection and/or diagnosis of SARS-CoV-2 by FDA under an Emergency Use  Authorization (EUA). This EUA will remain  in effect (meaning this test can be used) for the duration of the COVID-19 declaration under Section 564(b)(1) of the Act, 21 U.S.C.section 360bbb-3(b)(1), unless the authorization is terminated  or revoked sooner.       Influenza A by PCR NEGATIVE NEGATIVE Final   Influenza B by PCR NEGATIVE NEGATIVE Final    Comment: (NOTE) The Xpert Xpress SARS-CoV-2/FLU/RSV plus assay is intended as an aid in the diagnosis of influenza from Nasopharyngeal swab specimens and should not be used as a sole basis for treatment. Nasal washings and aspirates are unacceptable for Xpert Xpress SARS-CoV-2/FLU/RSV testing.  Fact Sheet for Patients: BloggerCourse.com  Fact Sheet for Healthcare Providers: SeriousBroker.it  This test is not yet approved or cleared by the [place] FDA and has been authorized for detection and/or diagnosis of SARS-CoV-2 by FDA under an Emergency Use Authorization (EUA). This EUA will remain in effect (meaning this test can be used) for the duration of the COVID-19 declaration under Section 564(b)(1) of the Act, 21 U.S.C. section 360bbb-3(b)(1), unless the authorization is terminated or revoked.  Performed at [facility], [address], [place], [place] [address]   Urine Culture     Status: Abnormal   Collection Time: 04/02/21  2:41 AM   Specimen: Urine, Clean Catch  Result Value Ref Range Status   Specimen Description   Final    URINE, CLEAN CATCH Performed at [facility], [address] [place], [place] [address]    Special Requests   Final    NONE Performed at [facility], [address] [place], [place] [address]    Culture (A)  Final    >=100,000 COLONIES/mL ENTEROBACTER CLOACAE 50,000 COLONIES/mL AEROCOCCUS SPECIES Standardized susceptibility testing for this organism is not available. Performed at  [facility], [address] [place], [place] [address]    Report Status 04/05/2021 FINAL  Final   Organism ID, Bacteria ENTEROBACTER CLOACAE (A)  Final      Susceptibility   Enterobacter cloacae - MIC*    CEFAZOLIN >=64 RESISTANT Resistant     CEFEPIME <=0.12 SENSITIVE Sensitive     CIPROFLOXACIN <=0.25 SENSITIVE Sensitive     GENTAMICIN <=1 SENSITIVE Sensitive     IMIPENEM <=0.25 SENSITIVE Sensitive     NITROFURANTOIN 64 INTERMEDIATE Intermediate     TRIMETH/SULFA >=320 RESISTANT Resistant     PIP/TAZO 8 SENSITIVE Sensitive     * >=100,000 COLONIES/mL ENTEROBACTER CLOACAE  Culture, blood (routine x 2)     Status: None (Preliminary result)   Collection Time: 04/02/21 11:24 PM   Specimen: BLOOD  Result Value Ref Range Status   Specimen Description   Final  BLOOD BLOOD RIGHT HAND Performed at [facility], [address], [place], [place] [address]    Special Requests   Final    BOTTLES DRAWN AEROBIC AND ANAEROBIC Blood Culture adequate volume Performed at [facility], [address] [place], [place] [address]    Culture   Final    NO GROWTH 1 DAY Performed at [facility], [address], [place], [place] [address]    Report Status PENDING  Incomplete  Culture, blood (routine x 2)     Status: None (Preliminary result)   Collection Time: 04/02/21 11:26 PM   Specimen: BLOOD  Result Value Ref Range Status   Specimen Description   Final    BLOOD RIGHT ANTECUBITAL Performed at [facility], [address], [place], [place] [address]    Special Requests   Final    BOTTLES DRAWN AEROBIC AND ANAEROBIC Blood Culture adequate volume Performed at [facility], [address], [place], [place] [address]    Culture   Final    NO GROWTH 1 DAY Performed at [facility], [address], [place], [place] [address]    Report Status PENDING  Incomplete         Radiology Studies: CT  ANGIO HEAD W OR WO CONTRAST  Addendum Date: 04/04/2021   ADDENDUM REPORT: 04/04/2021 15:55 ADDENDUM: CT head impression 1, CTA neck impression 1 and CTA head impressions 1, 2 and 6 called by telephone at the time of interpretation on 04/04/2021 at 3:50 pm to provider Dr. Denton Lank, who verbally acknowledged these results. Electronically Signed   By: Jackey Loge D.O.   On: 04/04/2021 15:55   Result Date: 04/04/2021 CLINICAL DATA:  Vertigo, central. Additional history provided: Sinus headache, vertigo, confusion. EXAM: CT ANGIOGRAPHY HEAD AND NECK TECHNIQUE: Multidetector CT imaging of the head and neck was performed using the standard protocol during bolus administration of intravenous contrast. Multiplanar CT image reconstructions and MIPs were obtained to evaluate the vascular anatomy. Carotid stenosis measurements (when applicable) are obtained utilizing NASCET criteria, using the distal internal carotid diameter as the denominator. CONTRAST:  59mL OMNIPAQUE IOHEXOL 350 MG/ML SOLN COMPARISON:  Head CT 04/02/2021. FINDINGS: CT HEAD FINDINGS Brain: Mild generalized cerebral atrophy. Acute/subacute appearing cortical/subcortical infarct within the right parietooccipital lobes and callosal splenium (right PCA vascular territory). No significant mass effect at this time. No evidence of hemorrhagic conversion. 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 Background moderate patchy and ill-defined hypoattenuation within the cerebral white matter, nonspecific but compatible with chronic small vessel ischemic disease. There is no acute intracranial hemorrhage. No extra-axial fluid collection. No evidence of an intracranial mass. No midline shift. Vascular: Reported below. Skull: Normal. Negative for fracture or focal lesion. Sinuses: Postsurgical appearance of the paranasal sinuses. Small mucous retention cyst versus polypoid mucosal thickening within the left maxillary sinus. Orbits: No acute or  significant orbital finding. Review of the MIP images confirms the above findings CTA NECK FINDINGS Aortic arch: Standard aortic branching. Atherosclerotic plaque within the proximal major branch vessels of the neck. No hemodynamically significant innominate or proximal subclavian artery stenosis. Right carotid system: CCA and ICA patent within the neck without stenosis. Minimal soft and calcified plaque within the CCA. Left carotid system: CCA and ICA patent within the neck without stenosis. Minimal atherosclerotic plaque about the carotid bifurcation. Vertebral arteries: The non dominant right vertebral artery becomes occluded at the C1-skull base level. The dominant left vertebral artery is  patent within the neck without stenosis. Skeleton: Partially imaged thoracic dextrocurvature. Multilevel bridging ventrolateral osteophytes with relative preservation of the intervertebral disc spaces. 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 No acute bony abnormality or aggressive osseous lesion. Other neck: 1.4 cm right thyroid lobe nodule, not meeting consensus criteria for ultrasound follow-up based on size. Upper chest: No consolidation within the imaged lung apices. Review of the MIP images confirms the above findings CTA HEAD FINDINGS Anterior circulation: The intracranial internal carotid arteries are patent. Calcified plaque within both vessels with no more than mild stenosis. There is a severe, near occlusive stenosis of the proximal to mid M1 left middle cerebral artery (series 14, image 57). The M1 right middle cerebral artery is patent with mild atherosclerotic irregularity. No M2 proximal branch occlusion or high-grade proximal stenosis is identified. The anterior cerebral arteries are patent. Moderate stenosis within the proximal right A1 segment. Posterior circulation: There is reconstitution of enhancement within the V4 right vertebral artery, likely due to retrograde flow.  Enhancement is present within the proximal right PICA. Mild-to-moderate atherosclerotic narrowing of the V4 left vertebral artery. The basilar artery is patent. Severe stenosis of a right PCA branch at the P2/P3 junction. Occlusion of a right PCA branch at the P3 segment. The left cerebral artery is fetal in origin and patent. However, there is a severe stenosis within the P2 left PCA. The right posterior communicating artery is diminutive or absent. Venous sinuses: Within the limitations of contrast timing, no convincing thrombus. Anatomic variants: As described. Review of the MIP images confirms the above findings IMPRESSION: CT head: 1. Acute/early subacute appearing right PCA territory cortical/subcortical infarct within the right parietooccipital lobes and callosal splenium. No significant mass effect at this time. No evidence of hemorrhagic conversion. 2. 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 3. Background moderate chronic small vessel ischemic changes within the cerebral white matter. 4. Mild generalized parenchymal atrophy. CTA neck: 1. The non-dominant right vertebral artery becomes occluded at the C1-skull base level. 2. The dominant left vertebral artery is patent throughout the neck without stenosis. 3. Common and internal carotid arteries patent within the neck bilaterally without stenosis. 4. 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 CTA head: 1. Occlusion of a P3 segment right posterior cerebral artery branch. 2. Severe stenosis of a right posterior cerebral artery branch at the P2/P3 junction. 3. Severe stenosis within the P2 segment of the left posterior cerebral artery. 4. Reconstitution of enhancement within the intracranial right vertebral artery, likely due to retrograde flow. 5. Mild/moderate stenosis within the V4 left vertebral artery. 6. 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 7. Moderate stenosis of the right  anterior cerebral artery A1 segment. 8. Atherosclerotic plaque within the intracranial internal carotid arteries bilaterally with no more than mild stenosis. Electronically Signed: By: Jackey Loge D.O. On: 04/04/2021 15:45   CT ANGIO NECK W OR WO CONTRAST  Addendum Date: 04/04/2021   ADDENDUM REPORT: 04/04/2021 15:55 ADDENDUM: CT head impression 1, CTA neck impression 1 and CTA head impressions 1, 2 and 6 called by telephone at the time of interpretation on 04/04/2021 at 3:50 pm to provider Dr. Denton Lank, who verbally acknowledged these results. Electronically Signed   By: Jackey Loge D.O.   On: 04/04/2021 15:55   Result Date: 04/04/2021 CLINICAL DATA:  Vertigo, central. Additional history provided: Sinus headache, vertigo, confusion. EXAM: CT ANGIOGRAPHY HEAD AND NECK TECHNIQUE: Multidetector CT imaging of the head and neck was performed using  the standard protocol during bolus administration of intravenous contrast. Multiplanar CT image reconstructions and MIPs were obtained to evaluate the vascular anatomy. Carotid stenosis measurements (when applicable) are obtained utilizing NASCET criteria, using the distal internal carotid diameter as the denominator. CONTRAST:  42mL OMNIPAQUE IOHEXOL 350 MG/ML SOLN COMPARISON:  Head CT 04/02/2021. FINDINGS: CT HEAD FINDINGS Brain: Mild generalized cerebral atrophy. Acute/subacute appearing cortical/subcortical infarct within the right parietooccipital lobes and callosal splenium (right PCA vascular territory). No significant mass effect at this time. No evidence of hemorrhagic conversion. 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 Background moderate patchy and ill-defined hypoattenuation within the cerebral white matter, nonspecific but compatible with chronic small vessel ischemic disease. There is no acute intracranial hemorrhage. No extra-axial fluid collection. No evidence of an intracranial mass. No midline shift. Vascular: Reported below. Skull:  Normal. Negative for fracture or focal lesion. Sinuses: Postsurgical appearance of the paranasal sinuses. Small mucous retention cyst versus polypoid mucosal thickening within the left maxillary sinus. Orbits: No acute or significant orbital finding. Review of the MIP images confirms the above findings CTA NECK FINDINGS Aortic arch: Standard aortic branching. Atherosclerotic plaque within the proximal major branch vessels of the neck. No hemodynamically significant innominate or proximal subclavian artery stenosis. Right carotid system: CCA and ICA patent within the neck without stenosis. Minimal soft and calcified plaque within the CCA. Left carotid system: CCA and ICA patent within the neck without stenosis. Minimal atherosclerotic plaque about the carotid bifurcation. Vertebral arteries: The non dominant right vertebral artery becomes occluded at the C1-skull base level. The dominant left vertebral artery is patent within the neck without stenosis. Skeleton: Partially imaged thoracic dextrocurvature. Multilevel bridging ventrolateral osteophytes with relative preservation of the intervertebral disc spaces. 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 No acute bony abnormality or aggressive osseous lesion. Other neck: 1.4 cm right thyroid lobe nodule, not meeting consensus criteria for ultrasound follow-up based on size. Upper chest: No consolidation within the imaged lung apices. Review of the MIP images confirms the above findings CTA HEAD FINDINGS Anterior circulation: The intracranial internal carotid arteries are patent. Calcified plaque within both vessels with no more than mild stenosis. There is a severe, near occlusive stenosis of the proximal to mid M1 left middle cerebral artery (series 14, image 57). The M1 right middle cerebral artery is patent with mild atherosclerotic irregularity. No M2 proximal branch occlusion or high-grade proximal stenosis is identified. The anterior  cerebral arteries are patent. Moderate stenosis within the proximal right A1 segment. Posterior circulation: There is reconstitution of enhancement within the V4 right vertebral artery, likely due to retrograde flow. Enhancement is present within the proximal right PICA. Mild-to-moderate atherosclerotic narrowing of the V4 left vertebral artery. The basilar artery is patent. Severe stenosis of a right PCA branch at the P2/P3 junction. Occlusion of a right PCA branch at the P3 segment. The left cerebral artery is fetal in origin and patent. However, there is a severe stenosis within the P2 left PCA. The right posterior communicating artery is diminutive or absent. Venous sinuses: Within the limitations of contrast timing, no convincing thrombus. Anatomic variants: As described. Review of the MIP images confirms the above findings IMPRESSION: CT head: 1. Acute/early subacute appearing right PCA territory cortical/subcortical infarct within the right parietooccipital lobes and callosal splenium. No significant mass effect at this time. No evidence of hemorrhagic conversion. 2. 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 3. Background moderate chronic small vessel ischemic changes within the cerebral white matter.  4. Mild generalized parenchymal atrophy. CTA neck: 1. The non-dominant right vertebral artery becomes occluded at the C1-skull base level. 2. The dominant left vertebral artery is patent throughout the neck without stenosis. 3. Common and internal carotid arteries patent within the neck bilaterally without stenosis. 4. 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 CTA head: 1. Occlusion of a P3 segment right posterior cerebral artery branch. 2. Severe stenosis of a right posterior cerebral artery branch at the P2/P3 junction. 3. Severe stenosis within the P2 segment of the left posterior cerebral artery. 4. Reconstitution of enhancement within the intracranial right  vertebral artery, likely due to retrograde flow. 5. Mild/moderate stenosis within the V4 left vertebral artery. 6. 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 7. Moderate stenosis of the right anterior cerebral artery A1 segment. 8. Atherosclerotic plaque within the intracranial internal carotid arteries bilaterally with no more than mild stenosis. Electronically Signed: By: Jackey Loge D.O. On: 04/04/2021 15:45   MR BRAIN WO CONTRAST  Result Date: 04/05/2021 CLINICAL DATA:  Dizziness EXAM: MRI HEAD WITHOUT CONTRAST TECHNIQUE: Multiplanar, multiecho pulse sequences of the brain and surrounding structures were obtained without intravenous contrast. COMPARISON:  None. FINDINGS: Brain: Multifocal abnormal diffusion restriction within both cerebral hemispheres right worse than left, in the left middle cerebellar peduncle and the inferior left cerebellum. No acute or chronic hemorrhage. Hyperintense T2-weighted signal is moderately widespread throughout the white matter. Generalized volume loss without a clear lobar predilection. The midline structures are normal. Vascular: Major flow voids are preserved. Skull and upper cervical spine: Normal calvarium and skull base. Visualized upper cervical spine and soft tissues are normal. Sinuses/Orbits:No paranasal sinus fluid levels or advanced mucosal thickening. No mastoid or middle ear effusion. Normal orbits. 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 Ischemia in the left middle cerebellar peduncle and inferior left cerebellum may be slightly older but is still in the recent subacute phase. No hemorrhage or mass effect. Electronically Signed   By: Deatra Robinson M.D.   On: 04/05/2021 03:19     Scheduled Meds:  amLODipine  5 mg Oral Daily   aspirin  81 mg Oral Daily   Or   aspirin  300 mg Rectal Daily   clopidogrel  75 mg Oral Daily   enoxaparin (LOVENOX) injection  40 mg Subcutaneous  Q24H   lactobacillus  1 g Oral BID   Continuous Infusions:  cefTRIAXone (ROCEPHIN)  IV 1 g (04/05/21 0125)     LOS: 3 days   Time spent:  Azucena Fallen, DO [facility]  If 7PM-7AM, please contact night-coverage [email or web address]  04/05/2021, 7:38 AM

## 2021-04-06 DIAGNOSIS — I10 Essential (primary) hypertension: Secondary | ICD-10-CM | POA: Diagnosis not present

## 2021-04-06 DIAGNOSIS — G9340 Encephalopathy, unspecified: Secondary | ICD-10-CM | POA: Diagnosis not present

## 2021-04-06 LAB — BASIC METABOLIC PANEL
Anion gap: 10 (ref 5–15)
BUN: 24 mg/dL — ABNORMAL HIGH (ref 8–23)
CO2: 22 mmol/L (ref 22–32)
Calcium: 8.5 mg/dL — ABNORMAL LOW (ref 8.9–10.3)
Chloride: 107 mmol/L (ref 98–111)
Creatinine, Ser: 1.44 mg/dL — ABNORMAL HIGH (ref 0.61–1.24)
GFR, Estimated: 51 mL/min — ABNORMAL LOW (ref >60)
Glucose, Bld: 129 mg/dL — ABNORMAL HIGH (ref 70–99)
Potassium: 3.7 mmol/L (ref 3.5–5.1)
Sodium: 139 mmol/L (ref 135–145)

## 2021-04-06 LAB — PROTIME-INR
INR: 1.2 (ref 0.8–1.2)
Prothrombin Time: 15 seconds (ref 11.4–15.2)

## 2021-04-06 LAB — CBC
HCT: 33.2 % — ABNORMAL LOW (ref 39.0–52.0)
Hemoglobin: 11 g/dL — ABNORMAL LOW (ref 13.0–17.0)
MCH: 28.5 pg (ref 26.0–34.0)
MCHC: 33.1 g/dL (ref 30.0–36.0)
MCV: 86 fL (ref 80.0–100.0)
Platelets: 305 10*3/uL (ref 150–400)
RBC: 3.86 MIL/uL — ABNORMAL LOW (ref 4.22–5.81)
RDW: 15.5 % (ref 11.5–15.5)
WBC: 14.6 10*3/uL — ABNORMAL HIGH (ref 4.0–10.5)
nRBC: 0 % (ref 0.0–0.2)

## 2021-04-06 MED ORDER — PROMETHAZINE HCL 25 MG RE SUPP
25.0000 mg | Freq: Four times a day (QID) | RECTAL | Status: DC | PRN
  Filled 2021-04-06: qty 1

## 2021-04-06 MED ORDER — PROMETHAZINE HCL 12.5 MG PO TABS
12.5000 mg | ORAL_TABLET | Freq: Four times a day (QID) | ORAL | Status: DC | PRN
  Administered 2021-04-06 – 2021-04-12 (×2): 12.5 mg via ORAL
  Filled 2021-04-06 (×4): qty 1

## 2021-04-06 MED ORDER — SODIUM CHLORIDE 0.9 % IV SOLN
12.5000 mg | Freq: Four times a day (QID) | INTRAVENOUS | Status: DC | PRN
  Filled 2021-04-06: qty 0.5

## 2021-04-06 NOTE — Care Management Important Message (Signed)
Important Message  Patient Details  Name: Nicholas Mckenzie MRN: [id number] Date of Birth: 1946-06-12   Medicare Important Message Given:  Yes     Renie Ora 04/06/2021, 9:23 AM

## 2021-04-06 NOTE — Consult Note (Signed)
Chief Complaint: Patient was seen in consultation today for obstruction of ureteral stent.   Referring Physician(s): Dr. Carma Leaven  Supervising Physician: Oley Balm  Patient Status: [facility] - In-pt  History of Present Illness: Nicholas Mckenzie is a 75 y.o. male with chronic renal insufficiency, who presented to the ED for fatigue, lightheadedness, dysuria, confusion, N/V/D.  He was found to have URI in the setting of Right ureteral stent obstruction.  He has history of recent stroke and is on ASA 81 and Plavix.  He has started Rocephin.  Plan for nephroureterectomy in the future.  Imaging demonstrates worsening hydronephrosis of the Right kidney and IR has been asked to place a PCN.  Past Medical History:  Diagnosis Date   CKD (chronic kidney disease) stage 3, GFR 30-59 ml/min (HCC)    Hypertension    Kidney stones    Sleep apnea     Past Surgical History:  Procedure Laterality Date   CATARACT EXTRACTION     kidney stent     MANDIBLE FRACTURE SURGERY      Allergies: Statins  Medications: Prior to Admission medications   Medication Sig Start Date End Date Taking? Authorizing Provider  acetaminophen (TYLENOL) 325 MG tablet Take 2 tablets (650 mg total) by mouth every 6 (six) hours as needed for mild pain (or Fever >/= 101). Patient not taking: No sig reported 03/12/21   Rodolph Bong, MD  amLODipine (NORVASC) 10 MG tablet Take 1 tablet (10 mg total) by mouth daily. Patient not taking: No sig reported 03/12/21   Rodolph Bong, MD  amoxicillin-clavulanate (AUGMENTIN) 875-125 MG tablet Take 1 tablet by mouth every 12 (twelve) hours. Patient not taking: No sig reported 03/12/21   Rodolph Bong, MD  hydrALAZINE (APRESOLINE) 25 MG tablet Take 1 tablet (25 mg total) by mouth every 8 (eight) hours. Patient not taking: No sig reported 03/12/21   Rodolph Bong, MD     Family History  Problem Relation Age of Onset   Hypertension Mother    Hypertension  Other     Social History   Socioeconomic History   Marital status: Married    Spouse name: Not on file   Number of children: Not on file   Years of education: Not on file   Highest education level: Not on file  Occupational History   Not on file  Tobacco Use   Smoking status: Never   Smokeless tobacco: Never  Substance and Sexual Activity   Alcohol use: Yes    Comment: Socially; twice a week   Drug use: Not on file   Sexual activity: Not on file  Other Topics Concern   Not on file  Social History Narrative   Not on file   Social Determinants of Health   Financial Resource Strain: Not on file  Food Insecurity: Not on file  Transportation Needs: Not on file  Physical Activity: Not on file  Stress: Not on file  Social Connections: Not on file     Review of Systems: A 12 point ROS discussed and pertinent positives are indicated in the HPI above.  All other systems are negative.  Review of Systems  Constitutional:  Positive for activity change and fatigue.  Eyes:  Positive for visual disturbance.  Respiratory: Negative.    Cardiovascular: Negative.   Gastrointestinal: Negative.   Genitourinary:  Positive for decreased urine volume, difficulty urinating and frequency.  Neurological:  Positive for light-headedness.  Psychiatric/Behavioral:  Positive for confusion.  Vital Signs: BP 137/79 (BP Location: Right Arm)   Pulse 84   Temp 99.4 F (37.4 C) (Oral)   Resp 18   Ht  (1.727 m)   Wt 208 lb 3.2 oz (94.4 kg)   SpO2 98%   BMI 31.66 kg/m   Physical Exam Constitutional:      General: He is not in acute distress. HENT:     Head: Normocephalic.     Mouth/Throat:     Mouth: Mucous membranes are dry.  Cardiovascular:     Rate and Rhythm: Normal rate and regular rhythm.     Pulses: Normal pulses.  Pulmonary:     Effort: Pulmonary effort is normal.  Abdominal:     General: Abdomen is flat.  Skin:    General: Skin is dry.  Neurological:     General:  No focal deficit present.     Mental Status: He is alert.  Psychiatric:        Behavior: Behavior normal.    Imaging: CT ABDOMEN PELVIS WO CONTRAST  Result Date: 04/01/2021 CLINICAL DATA:  Diarrhea, weakness, dizziness for 2 weeks EXAM: CT ABDOMEN AND PELVIS WITHOUT CONTRAST TECHNIQUE: Multidetector CT imaging of the abdomen and pelvis was performed following the standard protocol without IV contrast. Unenhanced CT was performed per clinician order. Lack of IV contrast limits sensitivity and specificity, especially for evaluation of abdominal/pelvic solid viscera. COMPARISON:  02/10/2021 FINDINGS: Lower chest: No acute pleural or parenchymal lung disease. Small hiatal hernia. Hepatobiliary: No focal liver abnormality is seen. No gallstones, gallbladder wall thickening, or biliary dilatation. Pancreas: Unremarkable. No pancreatic ductal dilatation or surrounding inflammatory changes. Spleen: Normal in size without focal abnormality. Adrenals/Urinary Tract: There is progressive severe right-sided obstructive uropathy with worsening hydronephrosis and hydroureter. Nonobstructing right renal calculus measuring 1 cm again noted. An indwelling right ureteral stent is coiled within the dilated right renal pelvis, and extends into the bladder lumen. A large concretion surrounds the distal aspect of the stent within the bladder lumen, measuring 3.1 cm, likely resulting in stent malfunction. There are multiple right ureteral calculi along the course of the right ureteral stent, largest measuring 1.1 cm. Stable small caliceal calculi within the upper pole left kidney measuring up to 7 mm. No left-sided obstruction. The bladder is decompressed, limiting its evaluation. The adrenals are unremarkable. Stomach/Bowel: No bowel obstruction or ileus. Extensive diverticulosis of the distal colon without diverticulitis. No bowel wall thickening or inflammatory change. Vascular/Lymphatic: Aortic atherosclerosis. No enlarged  abdominal or pelvic lymph nodes. Reproductive: Prostate is unremarkable. Other: No free fluid or free intraperitoneal gas. Stable fat containing right inguinal hernia. No bowel herniation. Musculoskeletal: No acute or destructive bony lesions. Reconstructed images demonstrate no additional findings. IMPRESSION: 1. Progressive severe right-sided hydronephrosis and hydroureter, likely due to malfunction of the indwelling right ureteral stent. A 3.1 cm concretion surrounding the distal margin of the right ureteral stent within the bladder lumen likely results in stent obstruction. 2. Bilateral nonobstructing renal calculi. 3. Multiple right ureteral calculi are seen along the course of the ureteral stent, largest measuring 1.1 cm. These are stable. 4. Distal colonic diverticulosis without diverticulitis. 5. Small hiatal hernia. 6.  Choose 1 Electronically Signed   By: Sharlet Salina M.D.   On: 04/01/2021 22:06   CT ANGIO HEAD W OR WO CONTRAST  Addendum Date: 04/04/2021   ADDENDUM REPORT: 04/04/2021 15:55 ADDENDUM: CT head impression 1, CTA neck impression 1 and CTA head impressions 1, 2 and 6 called by telephone at the  time of interpretation on 04/04/2021 at 3:50 pm to provider Dr. Denton Lank, who verbally acknowledged these results. Electronically Signed   By: Jackey Loge D.O.   On: 04/04/2021 15:55   Result Date: 04/04/2021 CLINICAL DATA:  Vertigo, central. Additional history provided: Sinus headache, vertigo, confusion. EXAM: CT ANGIOGRAPHY HEAD AND NECK TECHNIQUE: Multidetector CT imaging of the head and neck was performed using the standard protocol during bolus administration of intravenous contrast. Multiplanar CT image reconstructions and MIPs were obtained to evaluate the vascular anatomy. Carotid stenosis measurements (when applicable) are obtained utilizing NASCET criteria, using the distal internal carotid diameter as the denominator. CONTRAST:  60mL OMNIPAQUE IOHEXOL 350 MG/ML SOLN COMPARISON:  Head  CT 04/02/2021. FINDINGS: CT HEAD FINDINGS Brain: Mild generalized cerebral atrophy. Acute/subacute appearing cortical/subcortical infarct within the right parietooccipital lobes and callosal splenium (right PCA vascular territory). No significant mass effect at this time. No evidence of hemorrhagic conversion. 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 Background moderate patchy and ill-defined hypoattenuation within the cerebral white matter, nonspecific but compatible with chronic small vessel ischemic disease. There is no acute intracranial hemorrhage. No extra-axial fluid collection. No evidence of an intracranial mass. No midline shift. Vascular: Reported below. Skull: Normal. Negative for fracture or focal lesion. Sinuses: Postsurgical appearance of the paranasal sinuses. Small mucous retention cyst versus polypoid mucosal thickening within the left maxillary sinus. Orbits: No acute or significant orbital finding. Review of the MIP images confirms the above findings CTA NECK FINDINGS Aortic arch: Standard aortic branching. Atherosclerotic plaque within the proximal major branch vessels of the neck. No hemodynamically significant innominate or proximal subclavian artery stenosis. Right carotid system: CCA and ICA patent within the neck without stenosis. Minimal soft and calcified plaque within the CCA. Left carotid system: CCA and ICA patent within the neck without stenosis. Minimal atherosclerotic plaque about the carotid bifurcation. Vertebral arteries: The non dominant right vertebral artery becomes occluded at the C1-skull base level. The dominant left vertebral artery is patent within the neck without stenosis. Skeleton: Partially imaged thoracic dextrocurvature. Multilevel bridging ventrolateral osteophytes with relative preservation of the intervertebral disc spaces. 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 No acute bony abnormality or aggressive osseous  lesion. Other neck: 1.4 cm right thyroid lobe nodule, not meeting consensus criteria for ultrasound follow-up based on size. Upper chest: No consolidation within the imaged lung apices. Review of the MIP images confirms the above findings CTA HEAD FINDINGS Anterior circulation: The intracranial internal carotid arteries are patent. Calcified plaque within both vessels with no more than mild stenosis. There is a severe, near occlusive stenosis of the proximal to mid M1 left middle cerebral artery (series 14, image 57). The M1 right middle cerebral artery is patent with mild atherosclerotic irregularity. No M2 proximal branch occlusion or high-grade proximal stenosis is identified. The anterior cerebral arteries are patent. Moderate stenosis within the proximal right A1 segment. Posterior circulation: There is reconstitution of enhancement within the V4 right vertebral artery, likely due to retrograde flow. Enhancement is present within the proximal right PICA. Mild-to-moderate atherosclerotic narrowing of the V4 left vertebral artery. The basilar artery is patent. Severe stenosis of a right PCA branch at the P2/P3 junction. Occlusion of a right PCA branch at the P3 segment. The left cerebral artery is fetal in origin and patent. However, there is a severe stenosis within the P2 left PCA. The right posterior communicating artery is diminutive or absent. Venous sinuses: Within the limitations of contrast timing, no convincing thrombus. Anatomic  variants: As described. Review of the MIP images confirms the above findings IMPRESSION: CT head: 1. Acute/early subacute appearing right PCA territory cortical/subcortical infarct within the right parietooccipital lobes and callosal splenium. No significant mass effect at this time. No evidence of hemorrhagic conversion. 2. 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 3. Background moderate chronic small vessel ischemic changes within the cerebral white matter. 4.  Mild generalized parenchymal atrophy. CTA neck: 1. The non-dominant right vertebral artery becomes occluded at the C1-skull base level. 2. The dominant left vertebral artery is patent throughout the neck without stenosis. 3. Common and internal carotid arteries patent within the neck bilaterally without stenosis. 4. 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 CTA head: 1. Occlusion of a P3 segment right posterior cerebral artery branch. 2. Severe stenosis of a right posterior cerebral artery branch at the P2/P3 junction. 3. Severe stenosis within the P2 segment of the left posterior cerebral artery. 4. Reconstitution of enhancement within the intracranial right vertebral artery, likely due to retrograde flow. 5. Mild/moderate stenosis within the V4 left vertebral artery. 6. 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 7. Moderate stenosis of the right anterior cerebral artery A1 segment. 8. Atherosclerotic plaque within the intracranial internal carotid arteries bilaterally with no more than mild stenosis. Electronically Signed: By: Jackey Loge D.O. On: 04/04/2021 15:45   CT HEAD WO CONTRAST ( )  Result Date: 04/02/2021 CLINICAL DATA:  Delirium EXAM: CT HEAD WITHOUT CONTRAST TECHNIQUE: Contiguous axial images were obtained from the base of the skull through the vertex without intravenous contrast. COMPARISON:  None. FINDINGS: Brain: There is no mass, hemorrhage or extra-axial collection. There is generalized atrophy without lobar predilection. Hypodensity of the white matter is most commonly associated with chronic microvascular disease. 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 Vascular: No abnormal hyperdensity of the major intracranial arteries or dural venous sinuses. No intracranial atherosclerosis. Skull: The visualized skull base, calvarium and extracranial soft tissues are normal. Sinuses/Orbits: No fluid levels or advanced  mucosal thickening of the visualized paranasal sinuses. No mastoid or middle ear effusion. The orbits are normal. IMPRESSION: 1. No acute intracranial abnormality. 2. Generalized atrophy and chronic microvascular ischemia. Electronically Signed   By: Deatra Robinson M.D.   On: 04/02/2021 01:22   CT ANGIO NECK W OR WO CONTRAST  Addendum Date: 04/04/2021   ADDENDUM REPORT: 04/04/2021 15:55 ADDENDUM: CT head impression 1, CTA neck impression 1 and CTA head impressions 1, 2 and 6 called by telephone at the time of interpretation on 04/04/2021 at 3:50 pm to provider Dr. Denton Lank, who verbally acknowledged these results. Electronically Signed   By: Jackey Loge D.O.   On: 04/04/2021 15:55   Result Date: 04/04/2021 CLINICAL DATA:  Vertigo, central. Additional history provided: Sinus headache, vertigo, confusion. EXAM: CT ANGIOGRAPHY HEAD AND NECK TECHNIQUE: Multidetector CT imaging of the head and neck was performed using the standard protocol during bolus administration of intravenous contrast. Multiplanar CT image reconstructions and MIPs were obtained to evaluate the vascular anatomy. Carotid stenosis measurements (when applicable) are obtained utilizing NASCET criteria, using the distal internal carotid diameter as the denominator. CONTRAST:  2mL OMNIPAQUE IOHEXOL 350 MG/ML SOLN COMPARISON:  Head CT 04/02/2021. FINDINGS: CT HEAD FINDINGS Brain: Mild generalized cerebral atrophy. Acute/subacute appearing cortical/subcortical infarct within the right parietooccipital lobes and callosal splenium (right PCA vascular territory). No significant mass effect at this time. No evidence of hemorrhagic conversion. 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  Background moderate patchy and ill-defined hypoattenuation within the cerebral white matter, nonspecific but compatible with chronic small vessel ischemic disease. There is no acute intracranial hemorrhage. No extra-axial fluid collection. No evidence of an  intracranial mass. No midline shift. Vascular: Reported below. Skull: Normal. Negative for fracture or focal lesion. Sinuses: Postsurgical appearance of the paranasal sinuses. Small mucous retention cyst versus polypoid mucosal thickening within the left maxillary sinus. Orbits: No acute or significant orbital finding. Review of the MIP images confirms the above findings CTA NECK FINDINGS Aortic arch: Standard aortic branching. Atherosclerotic plaque within the proximal major branch vessels of the neck. No hemodynamically significant innominate or proximal subclavian artery stenosis. Right carotid system: CCA and ICA patent within the neck without stenosis. Minimal soft and calcified plaque within the CCA. Left carotid system: CCA and ICA patent within the neck without stenosis. Minimal atherosclerotic plaque about the carotid bifurcation. Vertebral arteries: The non dominant right vertebral artery becomes occluded at the C1-skull base level. The dominant left vertebral artery is patent within the neck without stenosis. Skeleton: Partially imaged thoracic dextrocurvature. Multilevel bridging ventrolateral osteophytes with relative preservation of the intervertebral disc spaces. 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 No acute bony abnormality or aggressive osseous lesion. Other neck: 1.4 cm right thyroid lobe nodule, not meeting consensus criteria for ultrasound follow-up based on size. Upper chest: No consolidation within the imaged lung apices. Review of the MIP images confirms the above findings CTA HEAD FINDINGS Anterior circulation: The intracranial internal carotid arteries are patent. Calcified plaque within both vessels with no more than mild stenosis. There is a severe, near occlusive stenosis of the proximal to mid M1 left middle cerebral artery (series 14, image 57). The M1 right middle cerebral artery is patent with mild atherosclerotic irregularity. No M2 proximal branch  occlusion or high-grade proximal stenosis is identified. The anterior cerebral arteries are patent. Moderate stenosis within the proximal right A1 segment. Posterior circulation: There is reconstitution of enhancement within the V4 right vertebral artery, likely due to retrograde flow. Enhancement is present within the proximal right PICA. Mild-to-moderate atherosclerotic narrowing of the V4 left vertebral artery. The basilar artery is patent. Severe stenosis of a right PCA branch at the P2/P3 junction. Occlusion of a right PCA branch at the P3 segment. The left cerebral artery is fetal in origin and patent. However, there is a severe stenosis within the P2 left PCA. The right posterior communicating artery is diminutive or absent. Venous sinuses: Within the limitations of contrast timing, no convincing thrombus. Anatomic variants: As described. Review of the MIP images confirms the above findings IMPRESSION: CT head: 1. Acute/early subacute appearing right PCA territory cortical/subcortical infarct within the right parietooccipital lobes and callosal splenium. No significant mass effect at this time. No evidence of hemorrhagic conversion. 2. 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 3. Background moderate chronic small vessel ischemic changes within the cerebral white matter. 4. Mild generalized parenchymal atrophy. CTA neck: 1. The non-dominant right vertebral artery becomes occluded at the C1-skull base level. 2. The dominant left vertebral artery is patent throughout the neck without stenosis. 3. Common and internal carotid arteries patent within the neck bilaterally without stenosis. 4. 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 CTA head: 1. Occlusion of a P3 segment right posterior cerebral artery branch. 2. Severe stenosis of a right posterior cerebral artery branch at the P2/P3 junction. 3. Severe stenosis within the P2 segment of the left posterior cerebral artery.  4. Reconstitution of enhancement  within the intracranial right vertebral artery, likely due to retrograde flow. 5. Mild/moderate stenosis within the V4 left vertebral artery. 6. 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 7. Moderate stenosis of the right anterior cerebral artery A1 segment. 8. Atherosclerotic plaque within the intracranial internal carotid arteries bilaterally with no more than mild stenosis. Electronically Signed: By: Jackey Loge D.O. On: 04/04/2021 15:45   MR BRAIN WO CONTRAST  Result Date: 04/05/2021 CLINICAL DATA:  Dizziness EXAM: MRI HEAD WITHOUT CONTRAST TECHNIQUE: Multiplanar, multiecho pulse sequences of the brain and surrounding structures were obtained without intravenous contrast. COMPARISON:  None. FINDINGS: Brain: Multifocal abnormal diffusion restriction within both cerebral hemispheres right worse than left, in the left middle cerebellar peduncle and the inferior left cerebellum. No acute or chronic hemorrhage. Hyperintense T2-weighted signal is moderately widespread throughout the white matter. Generalized volume loss without a clear lobar predilection. The midline structures are normal. Vascular: Major flow voids are preserved. Skull and upper cervical spine: Normal calvarium and skull base. Visualized upper cervical spine and soft tissues are normal. Sinuses/Orbits:No paranasal sinus fluid levels or advanced mucosal thickening. No mastoid or middle ear effusion. Normal orbits. 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 Ischemia in the left middle cerebellar peduncle and inferior left cerebellum may be slightly older but is still in the recent subacute phase. No hemorrhage or mass effect. Electronically Signed   By: Deatra Robinson M.D.   On: 04/05/2021 03:19   ECHOCARDIOGRAM COMPLETE  Result Date: 04/05/2021    ECHOCARDIOGRAM REPORT   Patient Name:   Nicholas Mckenzie  Date of Exam: 04/05/2021 Medical  Rec #:  [id number]  Height:       68.0 in Accession #:    [id number] Weight:       208.2 lb Date of Birth:  11/19/1945 BSA:          2.079 m Patient Age:    74 years   BP:           148/87 mmHg Patient Gender: M          HR:           82 bpm. Exam Location:  Inpatient Procedure: 2D Echo, Color Doppler and Cardiac Doppler Indications:    Stroke i63.9  History:        Patient has no prior history of Echocardiogram examinations.                 Risk Factors:Hypertension and Sleep Apnea.  Sonographer:    Alvera Novel Referring Phys: [id number] [facility] KHALIQDINA IMPRESSIONS  1. Consider further imaging of the aorta with CTA given enlargement.  2. Left ventricular ejection fraction, by estimation, is 60 to 65%. The left ventricle has normal function. The left ventricle has no regional wall motion abnormalities. There is mild left ventricular hypertrophy. Left ventricular diastolic parameters were normal.  3. Right ventricular systolic function is normal. The right ventricular size is normal. There is normal pulmonary artery systolic pressure.  4. Left atrial size was moderately dilated.  5. The mitral valve is normal in structure. No evidence of mitral valve regurgitation. No evidence of mitral stenosis.  6. The aortic valve is tricuspid. Aortic valve regurgitation is not visualized. No aortic stenosis is present.  7. Aortic dilatation noted. There is mild dilatation of the aortic root, measuring 39 mm. 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  8. The inferior vena cava is normal in size  with greater than 50% respiratory variability, suggesting right atrial pressure of 3 mmHg. FINDINGS  Left Ventricle: Left ventricular ejection fraction, by estimation, is 60 to 65%. The left ventricle has normal function. The left ventricle has no regional wall motion abnormalities. The left ventricular internal cavity size was normal in size. There is  mild left ventricular hypertrophy. Left ventricular  diastolic parameters were normal. Right Ventricle: The right ventricular size is normal. No increase in right ventricular wall thickness. Right ventricular systolic function is normal. There is normal pulmonary artery systolic pressure. The tricuspid regurgitant velocity is 2.03 m/s, and  with an assumed right atrial pressure of 8 mmHg, the estimated right ventricular systolic pressure is 24.5 mmHg. Left Atrium: Left atrial size was moderately dilated. Right Atrium: Right atrial size was normal in size. Pericardium: There is no evidence of pericardial effusion. Mitral Valve: The mitral valve is normal in structure. No evidence of mitral valve regurgitation. No evidence of mitral valve stenosis. Tricuspid Valve: The tricuspid valve is normal in structure. Tricuspid valve regurgitation is not demonstrated. No evidence of tricuspid stenosis. Aortic Valve: The aortic valve is tricuspid. Aortic valve regurgitation is not visualized. No aortic stenosis is present. Pulmonic Valve: The pulmonic valve was normal in structure. Pulmonic valve regurgitation is not visualized. No evidence of pulmonic stenosis. Aorta: Aortic dilatation noted. There is mild dilatation of the aortic root, measuring 39 mm. 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 Venous: The inferior vena cava is normal in size with greater than 50% respiratory variability, suggesting right atrial pressure of 3 mmHg. IAS/Shunts: No atrial level shunt detected by color flow Doppler. Additional Comments: Consider further imaging of the aorta with CTA given enlargement.  LEFT VENTRICLE PLAX 2D LVIDd:         4.80 cm   Diastology LVIDs:         3.30 cm   LV e' medial:    7.18 cm/s LV PW:         1.20 cm   LV E/e' medial:  7.7 LV IVS:        1.20 cm   LV e' lateral:   14.70 cm/s LVOT diam:     2.20 cm   LV E/e' lateral: 3.8 LV SV:         105 LV SV Index:   50 LVOT Area:     3.80 cm  RIGHT VENTRICLE RV S prime:     22.80 cm/s TAPSE (M-mode):  3.0 cm LEFT ATRIUM             Index        RIGHT ATRIUM           Index LA diam:        4.50 cm 2.16 cm/m   RA Area:     19.80 cm LA Vol (A2C):   56.7 ml 27.27 ml/m  RA Volume:   55.90 ml  26.89 ml/m LA Vol (A4C):   62.8 ml 30.21 ml/m LA Biplane Vol: 61.3 ml 29.49 ml/m  AORTIC VALVE LVOT Vmax:   148.00 cm/s LVOT Vmean:  96.000 cm/s LVOT VTI:    0.276 m  AORTA Ao Root diam: 3.90 cm Ao Asc diam:  4.50 cm MITRAL VALVE               TRICUSPID VALVE MV Area (PHT): 2.09 cm    TR Peak grad:   16.5 mmHg MV Decel Time: 363 msec    TR Vmax:  203.00 cm/s MV E velocity: 55.50 cm/s MV A velocity: 72.30 cm/s  SHUNTS MV E/A ratio:  0.77        Systemic VTI:  0.28 m                            Systemic Diam: 2.20 cm Charlton Haws MD Electronically signed by Charlton Haws MD Signature Date/Time: 04/05/2021/4:20:55 PM    Final     Labs:  CBC: Recent Labs    04/03/21 0600 04/04/21 0311 04/05/21 0200 04/06/21 0443  WBC 15.7* 17.3* 15.0* 14.6*  HGB 11.5* 10.9* 10.8* 11.0*  HCT 34.6* 32.8* 33.0* 33.2*  PLT 360 352 323 305    COAGS: No results for input(s): INR, APTT in the last 8760 hours.  BMP: Recent Labs    04/03/21 0600 04/04/21 0311 04/05/21 0200 04/06/21 0443  NA 140 140 140 139  K 3.7 3.4* 3.4* 3.7  CL 108 107 108 107  CO2 23 23 21* 22  GLUCOSE 126* 131* 124* 129*  BUN 31* 29* 24* 24*  CALCIUM 8.7* 8.3* 8.5* 8.5*  CREATININE 1.51* 1.67* 1.60* 1.44*  GFRNONAA 48* 43* 45* 51*    LIVER FUNCTION TESTS: Recent Labs    04/01/21 2255 04/02/21 0559 04/03/21 0600 04/04/21 0311  BILITOT 0.8 0.6 0.4 0.7  AST 42* 32 32 26  ALT 46* 41 42 35  ALKPHOS 75 74 70 71  PROT 6.2* 6.2* 5.7* 5.8*  ALBUMIN 2.2* 2.3* 1.9* 2.1*     Assessment and Plan:  Hydronephrosis- --ureteral stent obstructed by stone --plan for percutaneous nephrostomy tomorrow --INR ordered, unable to come off Plavix and ASA --NPO after midnight --patient and spouse in agreement  Risks and benefits discussed  with the patient and spouse, [name], including bleeding, infection, damage to adjacent structures, bowel perforation/fistula connection, and sepsis.  All of the patient's questions were answered, patient is agreeable to proceed. Consent signed and in IR.    Thank you for this interesting consult.  I greatly enjoyed meeting Nicholas Mckenzie and look forward to participating in their care.  A copy of this report was sent to the requesting provider on this date.  Electronically Signed: Sheliah Plane, PA 04/06/2021, 1:48 PM   I spent a total of 20 Minutes in face to face in clinical consultation, greater than 50% of which was counseling/coordinating care for hydronephrosis

## 2021-04-06 NOTE — Progress Notes (Signed)
PROGRESS NOTE    Nicholas Mckenzie  LHT:[id number] DOB: 10-31-1945 DOA: 04/01/2021 PCP: Eartha Inch, MD   Brief Narrative:  75 y.o. male with medical history significant for hypertension, nephrolithiasis, OSA on CPAP, chronic renal insufficiency, and depression, now presenting to emergency department for evaluation of fatigue, lightheadedness, dysuria, confusion, nausea, vomiting, and loose stools. Previously discharged 3 weeks ago from polypharmacy/accidental overdose. Who present with worsening malaise/weakness found to have UTI in the setting of R ureteral stent obstruction. Urology is consulted and following.  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  Acute metabolic encephalopathy, resolving -Likely multifactorial in the setting of infection, complicated by acute stroke  -Infection likely primary driving source given worsening confusion up to 10 days prior to admission, well before  strokelike symptoms  Nausea vomiting diarrhea, ongoing but improving -Mild episodes of nausea today, soft to loose stool ongoing but appears to be resolving from profound watery diarrhea noted previously  Transaminitis, resolved -Likely in setting of poor p.o. intake -LFTs downtrending appropriately   CKD stage IIIa -Resolving with IV fluids, increase p.o. intake as appropriate  Obstructive sleep apnea -continue CPAP nightly  Essential hypertension -BP stable upon admission while patient had been off antihypertensives at home for couple weeks.  Treat as needed for now.  Depression -Prozac held on admission due to somnolence     Obesity: Body mass index is 31.66 kg/m.  Complicates overall care and prognosis.  Recommend lifestyle modifications including physical activity and diet for weight loss and overall long-term health.    DVT prophylaxis: Lovenox Code Status: Full Family Communication: Wife updated at bedside, lengthy discussion about current infection, stroke possible need for procedure and further imaging  Status is: Inpatient  Dispo: The patient is from: Home              Anticipated d/c is to: CIR              Anticipated d/c date is: > 72 hours              Patient currently not medically stable for discharge  Consultants:  Urology, neurology  Antimicrobials:  Ceftriaxone  Subjective: Patient much more awake alert oriented to person place and general situation, oriented to year but not month.  Review of systems otherwise unremarkable other than ongoing nausea.  Objective: Vitals:   04/05/21 2328 04/05/21 2359 04/06/21 0119 04/06/21 0301  BP: (!) 150/76   (!) 142/75  Pulse: 90 89  72  Resp: (!) 24 18  18   Temp: (!) 101.4 F (38.6 C)  98.1 F (36.7 C) 98.8 F (37.1 C)  TempSrc: Oral  Oral Oral  SpO2: 96% 95%  96%  Weight:      Height:        Intake/Output Summary (Last 24 hours) at 04/06/2021 0804 Last data filed at 04/06/2021 0604 Gross per 24 hour   Intake 1440 ml  Output 700 ml  Net 740 ml    Filed Weights   04/02/21 1500 04/03/21 1957 04/03/21 2000  Weight: (P) 94.1 kg 95.7 kg 94.4 kg    Examination:  General:  Pleasantly resting in bed, No acute distress.  Alert to person place and year only HEENT:  Normocephalic atraumatic.  Sclerae nonicteric, noninjected.  Extraocular movements intact bilaterally. Neck:  Without mass or deformity.  Trachea is midline. Lungs:  Clear to auscultate bilaterally without rhonchi, wheeze, or rales. Heart:  Regular rate and rhythm.  Without murmurs, rubs, or gallops. Abdomen:  Soft, nontender, nondistended.  Without guarding or rebound. Extremities: Without cyanosis, clubbing, edema, or obvious deformity. Vascular:  Dorsalis pedis and posterior tibial pulses palpable bilaterally. Skin:  Warm and dry, no erythema, no ulcerations.  Data Reviewed: I have personally reviewed following labs and imaging studies  CBC: Recent Labs  Lab 04/01/21 2255 04/02/21 0559 04/03/21 0600 04/04/21 0311 04/05/21 0200 04/06/21 0443  WBC 12.0* 8.7 15.7* 17.3* 15.0* 14.6*  NEUTROABS 10.6*  --   --   --   --   --   HGB 11.8* 11.7* 11.5* 10.9* 10.8* 11.0*  HCT 37.0* 36.2* 34.6* 32.8* 33.0* 33.2*  MCV 89.8 88.7 85.9 85.0 85.9 86.0  PLT 340 403* 360 352 323 305    Basic Metabolic Panel: Recent Labs  Lab 04/02/21 0559 04/03/21 0600 04/04/21 0311 04/05/21 0200 04/06/21 0443  NA 144 140 140 140 139  K 3.6 3.7 3.4* 3.4* 3.7  CL 107 108 107 108 107  CO2 24 23 23  21* 22  GLUCOSE 117* 126* 131* 124* 129*  BUN 37* 31* 29* 24* 24*  CREATININE 1.59* 1.51* 1.67* 1.60* 1.44*  CALCIUM 9.2 8.7* 8.3* 8.5* 8.5*  MG  --   --  1.9  --   --     GFR: Estimated Creatinine Clearance: 50.9 mL/min (A) (by C-G formula based on SCr of 1.44 mg/dL (H)). Liver Function Tests: Recent Labs  Lab 04/01/21 2255 04/02/21 0559 04/03/21 0600 04/04/21 0311  AST 42* 32 32 26  ALT 46* 41 42 35  ALKPHOS 75 74 70 71  BILITOT  0.8 0.6 0.4 0.7  PROT 6.2* 6.2* 5.7* 5.8*  ALBUMIN 2.2* 2.3* 1.9* 2.1*    Recent Labs  Lab 04/01/21 2255  LIPASE 40    No results for input(s): AMMONIA in the last 168 hours. Coagulation Profile: No results for input(s): INR, PROTIME in the last 168 hours. Cardiac Enzymes: No results for input(s): CKTOTAL, CKMB, CKMBINDEX, TROPONINI in the last 168 hours. BNP (last 3 results) No results for input(s): PROBNP in the last 8760 hours. HbA1C: Recent Labs    04/05/21 0200  HGBA1C 6.1*    CBG: No results for input(s): GLUCAP in the last 168 hours. Lipid Profile: Recent Labs    04/05/21 0200  CHOL 82  HDL <10*  LDLCALC NOT CALCULATED  TRIG 91  CHOLHDL NOT CALCULATED    Thyroid Function Tests: No results for input(s): TSH, T4TOTAL, FREET4, T3FREE, THYROIDAB in the last 72 hours. Anemia Panel: No results for input(s): VITAMINB12, FOLATE, FERRITIN, TIBC, IRON, RETICCTPCT in the last 72 hours. Sepsis Labs: Recent Labs  Lab 04/01/21 2255 04/02/21 0200  LATICACIDVEN 1.3  1.0     Recent Results (from the past 240 hour(s))  Resp Panel by RT-PCR (Flu A&B, Covid) Nasopharyngeal Swab     Status: None   Collection Time: 04/01/21  8:00 PM   Specimen: Nasopharyngeal Swab; Nasopharyngeal(NP) swabs in vial transport medium  Result Value Ref Range Status   SARS Coronavirus 2 by RT PCR NEGATIVE NEGATIVE Final    Comment: (NOTE) SARS-CoV-2 target nucleic acids are NOT DETECTED.  The SARS-CoV-2 RNA is generally detectable in upper respiratory specimens during the acute phase of infection. The lowest concentration of SARS-CoV-2 viral copies this assay can detect is 138 copies/mL. A negative result does not preclude SARS-Cov-2 infection and should not be used as the sole basis for treatment or other patient management decisions. A negative result may occur with  improper specimen collection/handling, submission of specimen other than nasopharyngeal swab, presence of viral  mutation(s) within the areas targeted by this assay, and inadequate number of viral copies(<138 copies/mL). A negative result must be combined with clinical observations, patient history, and epidemiological information. The expected result is Negative.  Fact Sheet for Patients:  BloggerCourse.com  Fact Sheet for Healthcare Providers:  SeriousBroker.it  This test is no t yet approved or cleared by the [place] FDA and  has been authorized for detection and/or diagnosis of SARS-CoV-2 by FDA under an Emergency Use Authorization (EUA). This EUA will remain  in effect (meaning this test can be used) for the duration of the COVID-19 declaration under Section 564(b)(1) of the Act, 21 U.S.C.section 360bbb-3(b)(1), unless the authorization is terminated  or revoked sooner.       Influenza A by PCR NEGATIVE NEGATIVE Final   Influenza B by PCR NEGATIVE NEGATIVE Final    Comment: (NOTE) The Xpert Xpress SARS-CoV-2/FLU/RSV plus assay is intended as an aid in the diagnosis of influenza from Nasopharyngeal swab specimens and should not be used as a sole basis for treatment. Nasal washings and aspirates are unacceptable for Xpert Xpress SARS-CoV-2/FLU/RSV testing.  Fact Sheet for Patients: BloggerCourse.com  Fact Sheet for Healthcare Providers: SeriousBroker.it  This test is not yet approved or cleared by the [place] FDA and has been authorized for detection and/or diagnosis of SARS-CoV-2 by FDA under an Emergency Use Authorization (EUA). This EUA will remain in effect (meaning this test can be used) for the duration of the COVID-19 declaration under Section 564(b)(1) of the Act, 21 U.S.C. section 360bbb-3(b)(1), unless the authorization is terminated or revoked.  Performed at [facility], [address] [place], [place] [address]   Urine Culture      Status: Abnormal   Collection Time: 04/02/21  2:41 AM   Specimen: Urine, Clean Catch  Result Value Ref Range Status   Specimen Description   Final    URINE, CLEAN CATCH Performed at [facility], [address] [place], [place] [address]    Special Requests   Final    NONE Performed at [facility], [address], [place], [place] [address]    Culture (A)  Final    >=100,000 COLONIES/mL ENTEROBACTER CLOACAE 50,000 COLONIES/mL AEROCOCCUS SPECIES Standardized susceptibility testing for this organism is not available. Performed at [facility], [address], [place], [place] [address]    Report Status 04/05/2021 FINAL  Final   Organism ID, Bacteria ENTEROBACTER CLOACAE (A)  Final      Susceptibility   Enterobacter cloacae - MIC*    CEFAZOLIN >=64 RESISTANT Resistant  CEFEPIME <=0.12 SENSITIVE Sensitive     CIPROFLOXACIN <=0.25 SENSITIVE Sensitive     GENTAMICIN <=1 SENSITIVE Sensitive     IMIPENEM <=0.25 SENSITIVE Sensitive     NITROFURANTOIN 64 INTERMEDIATE Intermediate     TRIMETH/SULFA >=320 RESISTANT Resistant     PIP/TAZO 8 SENSITIVE Sensitive     * >=100,000 COLONIES/mL ENTEROBACTER CLOACAE  Culture, blood (routine x 2)     Status: None (Preliminary result)   Collection Time: 04/02/21 11:24 PM   Specimen: BLOOD  Result Value Ref Range Status   Specimen Description   Final    BLOOD BLOOD RIGHT HAND Performed at [facility], [address], [place], [place] [address]    Special Requests   Final    BOTTLES DRAWN AEROBIC AND ANAEROBIC Blood Culture adequate volume Performed at [facility], [address], [place], [place] [address]    Culture   Final    NO GROWTH 2 DAYS Performed at [facility], [address] [place], [place] [address]    Report Status PENDING  Incomplete  Culture, blood (routine x 2)     Status: None (Preliminary result)   Collection Time: 04/02/21  11:26 PM   Specimen: BLOOD  Result Value Ref Range Status   Specimen Description   Final    BLOOD RIGHT ANTECUBITAL Performed at [facility], [address] [place], [place] [address]    Special Requests   Final    BOTTLES DRAWN AEROBIC AND ANAEROBIC Blood Culture adequate volume Performed at [facility], [address], [place], [place] [address]    Culture   Final    NO GROWTH 2 DAYS Performed at [facility], [address] [place], [place] [address]    Report Status PENDING  Incomplete          Radiology Studies: CT ANGIO HEAD W OR WO CONTRAST  Addendum Date: 04/04/2021   ADDENDUM REPORT: 04/04/2021 15:55 ADDENDUM: CT head impression 1, CTA neck impression 1 and CTA head impressions 1, 2 and 6 called by telephone at the time of interpretation on 04/04/2021 at 3:50 pm to provider Dr. Denton Lank, who verbally acknowledged these results. Electronically Signed   By: Jackey Loge D.O.   On: 04/04/2021 15:55   Result Date: 04/04/2021 CLINICAL DATA:  Vertigo, central. Additional history provided: Sinus headache, vertigo, confusion. EXAM: CT ANGIOGRAPHY HEAD AND NECK TECHNIQUE: Multidetector CT imaging of the head and neck was performed using the standard protocol during bolus administration of intravenous contrast. Multiplanar CT image reconstructions and MIPs were obtained to evaluate the vascular anatomy. Carotid stenosis measurements (when applicable) are obtained utilizing NASCET criteria, using the distal internal carotid diameter as the denominator. CONTRAST:  60mL OMNIPAQUE IOHEXOL 350 MG/ML SOLN COMPARISON:  Head CT 04/02/2021. FINDINGS: CT HEAD FINDINGS Brain: Mild generalized cerebral atrophy. Acute/subacute appearing cortical/subcortical infarct within the right parietooccipital lobes and callosal splenium (right PCA vascular territory). No significant mass effect at this time. No evidence of hemorrhagic conversion. Chronic lacunar  infarcts within the right basal ganglia. Background moderate patchy and ill-defined hypoattenuation within the cerebral white matter, nonspecific but compatible with chronic small vessel ischemic disease. There is no acute intracranial hemorrhage. No extra-axial fluid collection. No evidence of an intracranial mass. No midline shift. Vascular: Reported below. Skull: Normal. Negative for fracture or focal lesion. Sinuses: Postsurgical appearance of the paranasal sinuses. Small mucous retention cyst versus polypoid mucosal thickening within the left maxillary sinus. Orbits:  No acute or significant orbital finding. Review of the MIP images confirms the above findings CTA NECK FINDINGS Aortic arch: Standard aortic branching. Atherosclerotic plaque within the proximal major branch vessels of the neck. No hemodynamically significant innominate or proximal subclavian artery stenosis. Right carotid system: CCA and ICA patent within the neck without stenosis. Minimal soft and calcified plaque within the CCA. Left carotid system: CCA and ICA patent within the neck without stenosis. Minimal atherosclerotic plaque about the carotid bifurcation. Vertebral arteries: The non dominant right vertebral artery becomes occluded at the C1-skull base level. The dominant left vertebral artery is patent within the neck without stenosis. Skeleton: Partially imaged thoracic dextrocurvature. Multilevel bridging ventrolateral osteophytes with relative preservation of the intervertebral disc spaces. 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 No acute bony abnormality or aggressive osseous lesion. Other neck: 1.4 cm right thyroid lobe nodule, not meeting consensus criteria for ultrasound follow-up based on size. Upper chest: No consolidation within the imaged lung apices. Review of the MIP images confirms the above findings CTA HEAD FINDINGS Anterior circulation: The intracranial internal carotid arteries are patent.  Calcified plaque within both vessels with no more than mild stenosis. There is a severe, near occlusive stenosis of the proximal to mid M1 left middle cerebral artery (series 14, image 57). The M1 right middle cerebral artery is patent with mild atherosclerotic irregularity. No M2 proximal branch occlusion or high-grade proximal stenosis is identified. The anterior cerebral arteries are patent. Moderate stenosis within the proximal right A1 segment. Posterior circulation: There is reconstitution of enhancement within the V4 right vertebral artery, likely due to retrograde flow. Enhancement is present within the proximal right PICA. Mild-to-moderate atherosclerotic narrowing of the V4 left vertebral artery. The basilar artery is patent. Severe stenosis of a right PCA branch at the P2/P3 junction. Occlusion of a right PCA branch at the P3 segment. The left cerebral artery is fetal in origin and patent. However, there is a severe stenosis within the P2 left PCA. The right posterior communicating artery is diminutive or absent. Venous sinuses: Within the limitations of contrast timing, no convincing thrombus. Anatomic variants: As described. Review of the MIP images confirms the above findings IMPRESSION: CT head: 1. Acute/early subacute appearing right PCA territory cortical/subcortical infarct within the right parietooccipital lobes and callosal splenium. No significant mass effect at this time. No evidence of hemorrhagic conversion. 2. 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 3. Background moderate chronic small vessel ischemic changes within the cerebral white matter. 4. Mild generalized parenchymal atrophy. CTA neck: 1. The non-dominant right vertebral artery becomes occluded at the C1-skull base level. 2. The dominant left vertebral artery is patent throughout the neck without stenosis. 3. Common and internal carotid arteries patent within the neck bilaterally without stenosis. 4. Findings  suggesting diffuse idiopathic skeletal hyperostosis (DISH). CTA head: 1. Occlusion of a P3 segment right posterior cerebral artery branch. 2. Severe stenosis of a right posterior cerebral artery branch at the P2/P3 junction. 3. Severe stenosis within the P2 segment of the left posterior cerebral artery. 4. Reconstitution of enhancement within the intracranial right vertebral artery, likely due to retrograde flow. 5. Mild/moderate stenosis within the V4 left vertebral artery. 6. 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 7. Moderate stenosis of the right anterior cerebral artery A1 segment. 8. Atherosclerotic plaque within the intracranial internal carotid arteries bilaterally with no more than mild stenosis. Electronically Signed: By: Jackey Loge D.O. On: 04/04/2021 15:45   CT ANGIO  NECK W OR WO CONTRAST  Addendum Date: 04/04/2021   ADDENDUM REPORT: 04/04/2021 15:55 ADDENDUM: CT head impression 1, CTA neck impression 1 and CTA head impressions 1, 2 and 6 called by telephone at the time of interpretation on 04/04/2021 at 3:50 pm to provider Dr. Denton Lank, who verbally acknowledged these results. Electronically Signed   By: Jackey Loge D.O.   On: 04/04/2021 15:55   Result Date: 04/04/2021 CLINICAL DATA:  Vertigo, central. Additional history provided: Sinus headache, vertigo, confusion. EXAM: CT ANGIOGRAPHY HEAD AND NECK TECHNIQUE: Multidetector CT imaging of the head and neck was performed using the standard protocol during bolus administration of intravenous contrast. Multiplanar CT image reconstructions and MIPs were obtained to evaluate the vascular anatomy. Carotid stenosis measurements (when applicable) are obtained utilizing NASCET criteria, using the distal internal carotid diameter as the denominator. CONTRAST:  60mL OMNIPAQUE IOHEXOL 350 MG/ML SOLN COMPARISON:  Head CT 04/02/2021. FINDINGS: CT HEAD FINDINGS Brain: Mild generalized cerebral atrophy. Acute/subacute appearing  cortical/subcortical infarct within the right parietooccipital lobes and callosal splenium (right PCA vascular territory). No significant mass effect at this time. No evidence of hemorrhagic conversion. 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 Background moderate patchy and ill-defined hypoattenuation within the cerebral white matter, nonspecific but compatible with chronic small vessel ischemic disease. There is no acute intracranial hemorrhage. No extra-axial fluid collection. No evidence of an intracranial mass. No midline shift. Vascular: Reported below. Skull: Normal. Negative for fracture or focal lesion. Sinuses: Postsurgical appearance of the paranasal sinuses. Small mucous retention cyst versus polypoid mucosal thickening within the left maxillary sinus. Orbits: No acute or significant orbital finding. Review of the MIP images confirms the above findings CTA NECK FINDINGS Aortic arch: Standard aortic branching. Atherosclerotic plaque within the proximal major branch vessels of the neck. No hemodynamically significant innominate or proximal subclavian artery stenosis. Right carotid system: CCA and ICA patent within the neck without stenosis. Minimal soft and calcified plaque within the CCA. Left carotid system: CCA and ICA patent within the neck without stenosis. Minimal atherosclerotic plaque about the carotid bifurcation. Vertebral arteries: The non dominant right vertebral artery becomes occluded at the C1-skull base level. The dominant left vertebral artery is patent within the neck without stenosis. Skeleton: Partially imaged thoracic dextrocurvature. Multilevel bridging ventrolateral osteophytes with relative preservation of the intervertebral disc spaces. 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 No acute bony abnormality or aggressive osseous lesion. Other neck: 1.4 cm right thyroid lobe nodule, not meeting consensus criteria for ultrasound follow-up  based on size. Upper chest: No consolidation within the imaged lung apices. Review of the MIP images confirms the above findings CTA HEAD FINDINGS Anterior circulation: The intracranial internal carotid arteries are patent. Calcified plaque within both vessels with no more than mild stenosis. There is a severe, near occlusive stenosis of the proximal to mid M1 left middle cerebral artery (series 14, image 57). The M1 right middle cerebral artery is patent with mild atherosclerotic irregularity. No M2 proximal branch occlusion or high-grade proximal stenosis is identified. The anterior cerebral arteries are patent. Moderate stenosis within the proximal right A1 segment. Posterior circulation: There is reconstitution of enhancement within the V4 right vertebral artery, likely due to retrograde flow. Enhancement is present within the proximal right PICA. Mild-to-moderate atherosclerotic narrowing of the V4 left vertebral artery. The basilar artery is patent. Severe stenosis of a right PCA branch at the P2/P3 junction. Occlusion of a right PCA branch at the P3 segment. The left cerebral artery is  fetal in origin and patent. However, there is a severe stenosis within the P2 left PCA. The right posterior communicating artery is diminutive or absent. Venous sinuses: Within the limitations of contrast timing, no convincing thrombus. Anatomic variants: As described. Review of the MIP images confirms the above findings IMPRESSION: CT head: 1. Acute/early subacute appearing right PCA territory cortical/subcortical infarct within the right parietooccipital lobes and callosal splenium. No significant mass effect at this time. No evidence of hemorrhagic conversion. 2. 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 3. Background moderate chronic small vessel ischemic changes within the cerebral white matter. 4. Mild generalized parenchymal atrophy. CTA neck: 1. The non-dominant right vertebral artery becomes occluded at  the C1-skull base level. 2. The dominant left vertebral artery is patent throughout the neck without stenosis. 3. Common and internal carotid arteries patent within the neck bilaterally without stenosis. 4. 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 CTA head: 1. Occlusion of a P3 segment right posterior cerebral artery branch. 2. Severe stenosis of a right posterior cerebral artery branch at the P2/P3 junction. 3. Severe stenosis within the P2 segment of the left posterior cerebral artery. 4. Reconstitution of enhancement within the intracranial right vertebral artery, likely due to retrograde flow. 5. Mild/moderate stenosis within the V4 left vertebral artery. 6. 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 7. Moderate stenosis of the right anterior cerebral artery A1 segment. 8. Atherosclerotic plaque within the intracranial internal carotid arteries bilaterally with no more than mild stenosis. Electronically Signed: By: Jackey Loge D.O. On: 04/04/2021 15:45   MR BRAIN WO CONTRAST  Result Date: 04/05/2021 CLINICAL DATA:  Dizziness EXAM: MRI HEAD WITHOUT CONTRAST TECHNIQUE: Multiplanar, multiecho pulse sequences of the brain and surrounding structures were obtained without intravenous contrast. COMPARISON:  None. FINDINGS: Brain: Multifocal abnormal diffusion restriction within both cerebral hemispheres right worse than left, in the left middle cerebellar peduncle and the inferior left cerebellum. No acute or chronic hemorrhage. Hyperintense T2-weighted signal is moderately widespread throughout the white matter. Generalized volume loss without a clear lobar predilection. The midline structures are normal. Vascular: Major flow voids are preserved. Skull and upper cervical spine: Normal calvarium and skull base. Visualized upper cervical spine and soft tissues are normal. Sinuses/Orbits:No paranasal sinus fluid levels or advanced mucosal thickening. No  mastoid or middle ear effusion. Normal orbits. 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 Ischemia in the left middle cerebellar peduncle and inferior left cerebellum may be slightly older but is still in the recent subacute phase. No hemorrhage or mass effect. Electronically Signed   By: Deatra Robinson M.D.   On: 04/05/2021 03:19   ECHOCARDIOGRAM COMPLETE  Result Date: 04/05/2021    ECHOCARDIOGRAM REPORT   Patient Name:   Nicholas Mckenzie  Date of Exam: 04/05/2021 Medical Rec #:  [id number]  Height:       68.0 in Accession #:    [id number] Weight:       208.2 lb Date of Birth:  [birth year] BSA:          2.079 m Patient Age:    74 years   BP:           148/87 mmHg Patient Gender: M          HR:           82 bpm. Exam Location:  Inpatient Procedure: 2D Echo, Color Doppler and Cardiac Doppler Indications:    Stroke i63.9  History:  Patient has no prior history of Echocardiogram examinations.                 Risk Factors:Hypertension and Sleep Apnea.  Sonographer:    Alvera Novel Referring Phys: [id number] [facility] KHALIQDINA IMPRESSIONS  1. Consider further imaging of the aorta with CTA given enlargement.  2. Left ventricular ejection fraction, by estimation, is 60 to 65%. The left ventricle has normal function. The left ventricle has no regional wall motion abnormalities. There is mild left ventricular hypertrophy. Left ventricular diastolic parameters were normal.  3. Right ventricular systolic function is normal. The right ventricular size is normal. There is normal pulmonary artery systolic pressure.  4. Left atrial size was moderately dilated.  5. The mitral valve is normal in structure. No evidence of mitral valve regurgitation. No evidence of mitral stenosis.  6. The aortic valve is tricuspid. Aortic valve regurgitation is not visualized. No aortic stenosis is present.  7. Aortic dilatation noted. There is mild dilatation of the aortic root, measuring 39 mm.  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  8. The inferior vena cava is normal in size with greater than 50% respiratory variability, suggesting right atrial pressure of 3 mmHg. FINDINGS  Left Ventricle: Left ventricular ejection fraction, by estimation, is 60 to 65%. The left ventricle has normal function. The left ventricle has no regional wall motion abnormalities. The left ventricular internal cavity size was normal in size. There is  mild left ventricular hypertrophy. Left ventricular diastolic parameters were normal. Right Ventricle: The right ventricular size is normal. No increase in right ventricular wall thickness. Right ventricular systolic function is normal. There is normal pulmonary artery systolic pressure. The tricuspid regurgitant velocity is 2.03 m/s, and  with an assumed right atrial pressure of 8 mmHg, the estimated right ventricular systolic pressure is 24.5 mmHg. Left Atrium: Left atrial size was moderately dilated. Right Atrium: Right atrial size was normal in size. Pericardium: There is no evidence of pericardial effusion. Mitral Valve: The mitral valve is normal in structure. No evidence of mitral valve regurgitation. No evidence of mitral valve stenosis. Tricuspid Valve: The tricuspid valve is normal in structure. Tricuspid valve regurgitation is not demonstrated. No evidence of tricuspid stenosis. Aortic Valve: The aortic valve is tricuspid. Aortic valve regurgitation is not visualized. No aortic stenosis is present. Pulmonic Valve: The pulmonic valve was normal in structure. Pulmonic valve regurgitation is not visualized. No evidence of pulmonic stenosis. Aorta: Aortic dilatation noted. There is mild dilatation of the aortic root, measuring 39 mm. 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 Venous: The inferior vena cava is normal in size with greater than 50% respiratory variability, suggesting right atrial pressure of 3 mmHg. IAS/Shunts:  No atrial level shunt detected by color flow Doppler. Additional Comments: Consider further imaging of the aorta with CTA given enlargement.  LEFT VENTRICLE PLAX 2D LVIDd:         4.80 cm   Diastology LVIDs:         3.30 cm   LV e' medial:    7.18 cm/s LV PW:         1.20 cm   LV E/e' medial:  7.7 LV IVS:        1.20 cm   LV e' lateral:   14.70 cm/s LVOT diam:     2.20 cm   LV E/e' lateral: 3.8 LV SV:         105 LV SV Index:  50 LVOT Area:     3.80 cm  RIGHT VENTRICLE RV S prime:     22.80 cm/s TAPSE (M-mode): 3.0 cm LEFT ATRIUM             Index        RIGHT ATRIUM           Index LA diam:        4.50 cm 2.16 cm/m   RA Area:     19.80 cm LA Vol (A2C):   56.7 ml 27.27 ml/m  RA Volume:   55.90 ml  26.89 ml/m LA Vol (A4C):   62.8 ml 30.21 ml/m LA Biplane Vol: 61.3 ml 29.49 ml/m  AORTIC VALVE LVOT Vmax:   148.00 cm/s LVOT Vmean:  96.000 cm/s LVOT VTI:    0.276 m  AORTA Ao Root diam: 3.90 cm Ao Asc diam:  4.50 cm MITRAL VALVE               TRICUSPID VALVE MV Area (PHT): 2.09 cm    TR Peak grad:   16.5 mmHg MV Decel Time: 363 msec    TR Vmax:        203.00 cm/s MV E velocity: 55.50 cm/s MV A velocity: 72.30 cm/s  SHUNTS MV E/A ratio:  0.77        Systemic VTI:  0.28 m                            Systemic Diam: 2.20 cm Charlton Haws MD Electronically signed by Charlton Haws MD Signature Date/Time: 04/05/2021/4:20:55 PM    Final      Scheduled Meds:  amLODipine  5 mg Oral Daily   aspirin  81 mg Oral Daily   Or   aspirin  300 mg Rectal Daily   clopidogrel  75 mg Oral Daily   enoxaparin (LOVENOX) injection  40 mg Subcutaneous Q24H   lactobacillus  1 g Oral BID   Continuous Infusions:  cefTRIAXone (ROCEPHIN)  IV 1 g (04/06/21 0116)     LOS: 4 days   Time spent:  Azucena Fallen, DO [facility]  If 7PM-7AM, please contact night-coverage [email or web address]  04/06/2021, 8:04 AM

## 2021-04-06 NOTE — Progress Notes (Signed)
RT went to place pt on cpap for the night, pt refusing for the night.

## 2021-04-06 NOTE — Progress Notes (Signed)
Physical Therapy Treatment Patient Details Name: Nicholas Mckenzie MRN: [id number] DOB: 10/24/1945 Today's Date: 04/06/2021   History of Present Illness The pt is a 75 yo male admitted 10/8 to [facility] with c/o dizziness and weakness x2 weeks. Pt found to have UTI, nephrolithiasis, and obstructed right ureteral stent. PT noted vestibular dysfunction indicating central lesion on 10/11 and pt then found to have R PCA, L cerebellar, and L periventricular infarcts as well as left inferior quadrantanopsia. PMH includes: accidential overdose, CKD III, depression, HTN, and kidney stones.     PT Comments    The pt was agreeable to limited session due to fatigue and nausea, but was able to complete sit-stand transfers and short bout of steps with modA at this time. Will continue to benefit from skilled PT to address dynamic stability, strength, and activity tolerance, but will benefit from CIR level therapies to maximize pt recovery and return to independence.     Recommendations for follow up therapy are one component of a multi-disciplinary discharge planning process, led by the attending physician.  Recommendations may be updated based on patient status, additional functional criteria and insurance authorization.  Follow Up Recommendations  CIR     Equipment Recommendations   (defer to post acute)    Recommendations for Other Services       Precautions / Restrictions Precautions Precautions: Fall Precaution Comments: dizzness, nausea Restrictions Weight Bearing Restrictions: No     Mobility  Bed Mobility Overal bed mobility: Needs Assistance Bed Mobility: Sit to Supine     Supine to sit: Mod assist Sit to supine: Mod assist;+2 for physical assistance   General bed mobility comments: pt unable to follow cues to return to bed, able to initiate movements with all extremities but needing modA to complete movements and +2 to reposition in bed after returning to supine    Transfers Overall  transfer level: Needs assistance Equipment used: Rolling walker (2 wheeled) Transfers: Sit to/from [employer] Sit to Stand: Min assist Stand pivot transfers: Mod assist       General transfer comment: minA to power up with VC for hand placement and posture in standing. modA to manage RW movements and balance in standing with pivoting steps to the side  Ambulation/Gait Ambulation/Gait assistance: Mod assist Gait Distance (Feet): 3 Feet Assistive device: Rolling walker (2 wheeled) Gait Pattern/deviations: Step-to pattern;Decreased stride length;Trunk flexed;Narrow base of support     General Gait Details: pt with small steps, increased time and effort to manage advancement and placement of feet, max cues for direction and modA to RW to manage and maintain safety. chair follow helpful for gait progression   Stairs             Wheelchair Mobility    Modified Rankin (Stroke Patients Only) Modified Rankin (Stroke Patients Only) Pre-Morbid Rankin Score: Slight disability Modified Rankin: Moderately severe disability     Balance Overall balance assessment: Needs assistance;History of Falls Sitting-balance support: Feet supported Sitting balance-Leahy Scale: Fair Sitting balance - Comments: able to maintain balance with one extremity support Postural control: Right lateral lean Standing balance support: Bilateral upper extremity supported Standing balance-Leahy Scale: Poor Standing balance comment: BUE support for static stance and gait                            Cognition Arousal/Alertness: Awake/alert Behavior During Therapy: Flat affect Overall Cognitive Status: Difficult to assess Area of Impairment: Orientation;Attention;Following commands;Problem solving  Orientation Level: Disoriented to;Time Current Attention Level: Selective   Following Commands: Follows multi-step commands with increased time;Follows  multi-step commands inconsistently;Follows one step commands with increased time     Problem Solving: Slow processing;Requires verbal cues;Decreased initiation General Comments: pt following all commands during session but deeding increased cues at times to sequence tasks such as bed mobility. maintained flat affect due to dizziness so difficult to discern cog vs flat affect/behavior      Exercises      General Comments General comments (skin integrity, edema, etc.): VSS on RA, pt reports consistent dizziness and nausea through session, wife stating zofran increases nausea, alerted RN      Pertinent Vitals/Pain Pain Assessment: No/denies pain Pain Location: dizziness    Home Living     Available Help at Discharge: Family Type of Home: House                  PT Goals (current goals can now be found in the care plan section) Acute Rehab PT Goals Patient Stated Goal: to rest PT Goal Formulation: With patient/family Time For Goal Achievement: 04/17/21 Potential to Achieve Goals: Good Progress towards PT goals: Progressing toward goals    Frequency    Min 4X/week      PT Plan Current plan remains appropriate    Co-evaluation              AM-PAC PT "6 Clicks" Mobility   Outcome Measure  Help needed turning from your back to your side while in a flat bed without using bedrails?: A Little Help needed moving from lying on your back to sitting on the side of a flat bed without using bedrails?: A Lot Help needed moving to and from a bed to a chair (including a wheelchair)?: A Lot Help needed standing up from a chair using your arms (e.g., wheelchair or bedside chair)?: A Little Help needed to walk in hospital room?: A Lot Help needed climbing 3-5 steps with a railing? : Total 6 Click Score: 13    End of Session Equipment Utilized During Treatment: Gait belt Activity Tolerance: Treatment limited secondary to medical complications (Comment) (nausea) Patient  left: in bed;with call bell/phone within reach;with bed alarm set;with family/visitor present Nurse Communication: Mobility status (nausea) PT Visit Diagnosis: Muscle weakness (generalized) (M62.81);Difficulty in walking, not elsewhere classified (R26.2);Ataxic gait (R26.0)     Time: 5631-4970 PT Time Calculation (min) (ACUTE ONLY): 15 min  Charges:  $Therapeutic Activity: 8-22 mins                     Vickki Muff, PT, DPT   Acute Rehabilitation Department Pager #: [phone]   Ronnie Derby 04/06/2021, 2:51 PM

## 2021-04-06 NOTE — Progress Notes (Signed)
Occupational Therapy Treatment Patient Details Name: Nicholas Mckenzie MRN: [id number] DOB: 09-25-1945 Today's Date: 04/06/2021   History of present illness 75 yo male admitted with AMS, UTI, weakness, fevers, R obstructed ureteral stent + bladder stone-urology following. Hx of chronic renal insufficiency, OSA   OT comments  Patient received in bed and eager to participate. Patient seen by skilled OT to address bed mobility, transfer training, and grooming seated.  Patient had complaints of dizziness once up on eob and nausea at end of treatment.  BP checked at end of session with 145/78 and nursing informed. Acute OT to continue to follow.    Recommendations for follow up therapy are one component of a multi-disciplinary discharge planning process, led by the attending physician.  Recommendations may be updated based on patient status, additional functional criteria and insurance authorization.    Follow Up Recommendations  CIR    Equipment Recommendations  Other (comment)    Recommendations for Other Services      Precautions / Restrictions Precautions Precautions: Fall Precaution Comments: dizzness, nausea Restrictions Weight Bearing Restrictions: No       Mobility Bed Mobility Overal bed mobility: Needs Assistance Bed Mobility: Supine to Sit     Supine to sit: Mod assist     General bed mobility comments: patient required assistance with BLEs to get to eob.  Once on eob he had complaints of dizziness which subsided time to allow for transfer to recliner.    Transfers Overall transfer level: Needs assistance Equipment used: Rolling walker (2 wheeled) Transfers: Sit to/from Stand Sit to Stand: Min assist         General transfer comment: rquired min assist to stand and for correct walker use.  Verbal cues to push up from bed and to reach back before sitting.    Balance Overall balance assessment: Needs assistance;History of Falls Sitting-balance support: Feet  supported Sitting balance-Leahy Scale: Fair Sitting balance - Comments: able to maintain balance with one extremity support Postural control: Right lateral lean Standing balance support: Bilateral upper extremity supported Standing balance-Leahy Scale: Poor Standing balance comment: stood with rw for transfer to recliner                           ADL either performed or assessed with clinical judgement   ADL Overall ADL's : Needs assistance/impaired     Grooming: Supervision/safety;Cueing for sequencing;Sitting;Wash/dry hands;Wash/dry face;Oral care Grooming Details (indicate cue type and reason): patient required verbal cues for sequencing with brushing teeth; attempted to brush teeth without applying toothpaste                 Toilet Transfer: Minimal assistance Toilet Transfer Details (indicate cue type and reason): simulated with transfer to recliner           General ADL Comments: grooming items placed on left side to allow for visual scanning     Vision       Perception     Praxis      Cognition Arousal/Alertness: Awake/alert Behavior During Therapy: WFL for tasks assessed/performed Overall Cognitive Status: Impaired/Different from baseline Area of Impairment: Orientation;Attention;Following commands;Problem solving                 Orientation Level: Disoriented to;Time Current Attention Level: Selective   Following Commands: Follows multi-step commands with increased time;Follows multi-step commands inconsistently;Follows one step commands with increased time     Problem Solving: Slow processing;Requires verbal cues;Decreased initiation General Comments: patient rquired  extra time to process commands        Exercises     Shoulder Instructions       General Comments      Pertinent Vitals/ Pain       Pain Assessment: No/denies pain  Home Living                                          Prior  Functioning/Environment              Frequency  Min 2X/week        Progress Toward Goals  OT Goals(current goals can now be found in the care plan section)  Progress towards OT goals: Progressing toward goals  Acute Rehab OT Goals Patient Stated Goal: to rest OT Goal Formulation: With patient Time For Goal Achievement: 04/18/21 Potential to Achieve Goals: Good ADL Goals Pt Will Perform Upper Body Bathing: with modified independence;sitting Pt Will Transfer to Toilet: with supervision;bedside commode;ambulating Additional ADL Goal #1: patient to demonstrate use of compesatory strategies to compenate for visual deficits with MI during ADL tasks.  Plan Discharge plan remains appropriate    Co-evaluation                 AM-PAC OT "6 Clicks" Daily Activity     Outcome Measure   Help from another person eating meals?: A Little Help from another person taking care of personal grooming?: A Lot Help from another person toileting, which includes using toliet, bedpan, or urinal?: Total Help from another person bathing (including washing, rinsing, drying)?: Total Help from another person to put on and taking off regular upper body clothing?: A Lot Help from another person to put on and taking off regular lower body clothing?: A Lot 6 Click Score: 11    End of Session Equipment Utilized During Treatment: Gait belt;Rolling walker  OT Visit Diagnosis: Unsteadiness on feet (R26.81);Other symptoms and signs involving cognitive function;Muscle weakness (generalized) (M62.81);Other abnormalities of gait and mobility (R26.89);History of falling (Z91.81)   Activity Tolerance Patient tolerated treatment well   Patient Left in chair;with call bell/phone within reach;with chair alarm set;with family/visitor present   Nurse Communication Other (comment) (on patient's complaints of nausea)        Time: 6237-6283 OT Time Calculation (min): 30 min  Charges: OT General  Charges $OT Visit: 1 Visit OT Treatments $Self Care/Home Management : 23-37 mins  Alfonse Flavors, OTA   Aundra Espin Jeannett Senior 04/06/2021, 1:19 PM

## 2021-04-06 NOTE — Progress Notes (Signed)
STROKE TEAM PROGRESS NOTE   INTERVAL HISTORY His friend is at the bedside.  He complains of visual loss affecting left eye yet persist.  He is pleasant and cooperative.  Blood cultures have been negative so far.  He is afebrile now.  Her white count remains elevated. Vitals:   04/06/21 0119 04/06/21 0301 04/06/21 0859 04/06/21 1119  BP:  (!) 142/75 (!) 150/79 137/79  Pulse:  72 85 84  Resp:  '18 20 18  ' Temp: 98.1 F (36.7 C) 98.8 F (37.1 C) 98.7 F (37.1 C) 99.4 F (37.4 C)  TempSrc: Oral Oral Oral Oral  SpO2:  96% 96% 98%  Weight:      Height:       CBC:  Recent Labs  Lab 04/01/21 2255 04/02/21 0559 04/05/21 0200 04/06/21 0443  WBC 12.0*   < > 15.0* 14.6*  NEUTROABS 10.6*  --   --   --   HGB 11.8*   < > 10.8* 11.0*  HCT 37.0*   < > 33.0* 33.2*  MCV 89.8   < > 85.9 86.0  PLT 340   < > 323 305   < > = values in this interval not displayed.   Basic Metabolic Panel:  Recent Labs  Lab 04/04/21 0311 04/05/21 0200 04/06/21 0443  NA 140 140 139  K 3.4* 3.4* 3.7  CL 107 108 107  CO2 23 21* 22  GLUCOSE 131* 124* 129*  BUN 29* 24* 24*  CREATININE 1.67* 1.60* 1.44*  CALCIUM 8.3* 8.5* 8.5*  MG 1.9  --   --     Lipid Panel:  Recent Labs  Lab 04/05/21 0200  CHOL 82  TRIG 91  HDL <10*  CHOLHDL NOT CALCULATED  VLDL 18  LDLCALC NOT CALCULATED    HgbA1c:  Recent Labs  Lab 04/05/21 0200  HGBA1C 6.1*   Urine Drug Screen: No results for input(s): LABOPIA, COCAINSCRNUR, LABBENZ, AMPHETMU, THCU, LABBARB in the last 168 hours.  Alcohol Level No results for input(s): ETH in the last 168 hours.  IMAGING past 24 hours ECHOCARDIOGRAM COMPLETE  Result Date: 04/05/2021    ECHOCARDIOGRAM REPORT   Patient Name:   Nicholas Mckenzie  Date of Exam: 04/05/2021 Medical Rec #:  [id number]  Height:       68.0 in Accession #:    [id number] Weight:       208.2 lb Date of Birth:  06-Jun-1946 BSA:          2.079 m Patient Age:    75 years   BP:           148/87 mmHg Patient Gender: M           HR:           82 bpm. Exam Location:  Inpatient Procedure: 2D Echo, Color Doppler and Cardiac Doppler Indications:    Stroke i63.9  History:        Patient has no prior history of Echocardiogram examinations.                 Risk Factors:Hypertension and Sleep Apnea.  Sonographer:    Helmut Muster Referring Phys: [id number] Warren  1. Consider further imaging of the aorta with CTA given enlargement.  2. Left ventricular ejection fraction, by estimation, is 60 to 65%. The left ventricle has normal function. The left ventricle has no regional wall motion abnormalities. There is mild left ventricular hypertrophy. Left ventricular diastolic parameters were normal.  3. Right ventricular systolic  function is normal. The right ventricular size is normal. There is normal pulmonary artery systolic pressure.  4. Left atrial size was moderately dilated.  5. The mitral valve is normal in structure. No evidence of mitral valve regurgitation. No evidence of mitral stenosis.  6. The aortic valve is tricuspid. Aortic valve regurgitation is not visualized. No aortic stenosis is present.  7. Aortic dilatation noted. There is mild dilatation of the aortic root, measuring 39 mm. 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  8. The inferior vena cava is normal in size with greater than 50% respiratory variability, suggesting right atrial pressure of 3 mmHg. FINDINGS  Left Ventricle: Left ventricular ejection fraction, by estimation, is 60 to 65%. The left ventricle has normal function. The left ventricle has no regional wall motion abnormalities. The left ventricular internal cavity size was normal in size. There is  mild left ventricular hypertrophy. Left ventricular diastolic parameters were normal. Right Ventricle: The right ventricular size is normal. No increase in right ventricular wall thickness. Right ventricular systolic function is normal. There is normal pulmonary artery systolic  pressure. The tricuspid regurgitant velocity is 2.03 m/s, and  with an assumed right atrial pressure of 8 mmHg, the estimated right ventricular systolic pressure is 45.4 mmHg. Left Atrium: Left atrial size was moderately dilated. Right Atrium: Right atrial size was normal in size. Pericardium: There is no evidence of pericardial effusion. Mitral Valve: The mitral valve is normal in structure. No evidence of mitral valve regurgitation. No evidence of mitral valve stenosis. Tricuspid Valve: The tricuspid valve is normal in structure. Tricuspid valve regurgitation is not demonstrated. No evidence of tricuspid stenosis. Aortic Valve: The aortic valve is tricuspid. Aortic valve regurgitation is not visualized. No aortic stenosis is present. Pulmonic Valve: The pulmonic valve was normal in structure. Pulmonic valve regurgitation is not visualized. No evidence of pulmonic stenosis. Aorta: Aortic dilatation noted. There is mild dilatation of the aortic root, measuring 39 mm. 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 Venous: The inferior vena cava is normal in size with greater than 50% respiratory variability, suggesting right atrial pressure of 3 mmHg. IAS/Shunts: No atrial level shunt detected by color flow Doppler. Additional Comments: Consider further imaging of the aorta with CTA given enlargement.  LEFT VENTRICLE PLAX 2D LVIDd:         4.80 cm   Diastology LVIDs:         3.30 cm   LV e' medial:    7.18 cm/s LV PW:         1.20 cm   LV E/e' medial:  7.7 LV IVS:        1.20 cm   LV e' lateral:   14.70 cm/s LVOT diam:     2.20 cm   LV E/e' lateral: 3.8 LV SV:         105 LV SV Index:   50 LVOT Area:     3.80 cm  RIGHT VENTRICLE RV S prime:     22.80 cm/s TAPSE (M-mode): 3.0 cm LEFT ATRIUM             Index        RIGHT ATRIUM           Index LA diam:        4.50 cm 2.16 cm/m   RA Area:     19.80 cm LA Vol (A2C):   56.7 ml 27.27 ml/m  RA Volume:  55.90 ml  26.89 ml/m LA Vol (A4C):   62.8 ml  30.21 ml/m LA Biplane Vol: 61.3 ml 29.49 ml/m  AORTIC VALVE LVOT Vmax:   148.00 cm/s LVOT Vmean:  96.000 cm/s LVOT VTI:    0.276 m  AORTA Ao Root diam: 3.90 cm Ao Asc diam:  4.50 cm MITRAL VALVE               TRICUSPID VALVE MV Area (PHT): 2.09 cm    TR Peak grad:   16.5 mmHg MV Decel Time: 363 msec    TR Vmax:        203.00 cm/s MV E velocity: 55.50 cm/s MV A velocity: 72.30 cm/s  SHUNTS MV E/A ratio:  0.77        Systemic VTI:  0.28 m                            Systemic Diam: 2.20 cm Jenkins Rouge MD Electronically signed by Jenkins Rouge MD Signature Date/Time: 04/05/2021/4:20:55 PM    Final     PHYSICAL EXAM Pleasant elderly Caucasian male not in distress. . Afebrile. Head is nontraumatic. Neck is supple without bruit.    Cardiac exam no murmur or gallop. Lungs are clear to auscultation. Distal pulses are well felt.   Mental status/Cognition: Alert, oriented to self, place, month and year, good attention.  Diminished attention and recall. Speech/language: Fluent, comprehension intact, object naming intact, repetition intact.  Cranial nerves:   CN II Pupils equal and reactive to light, Left homonymous hemianopsia   CN III,IV,VI EOM intact, no gaze preference or deviation, no nystagmus    CN V normal sensation in V1, V2, and V3 segments bilaterally    CN VII no asymmetry, no nasolabial fold flattening    CN VIII normal hearing to speech    CN IX & X normal palatal elevation, no uvular deviation    CN XI 5/5 head turn and 5/5 shoulder shrug bilaterally    CN XII midline tongue protrusion     motor: muscle bulk normal, tone normal, no pronator drift seen. Strength: 5/5 throughout  Sensation: intact throughout to light touch.   ASSESSMENT/PLAN Nicholas Mckenzie is a 75 y.o. male with history of   CKD 3, OSA, HTN, Kidney stones, OSA on CPAP who presented to the ED with malaise, lightheaded, confusion, dysuria, nausea and vomiting. He is being treated for a Complicated UTI due to right ureteral stent  obstruction. 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 This was not noted on CTH 2 days prior at the time of admission.   He was [facility] to [facility] for further workup and stroke evaluation.   Denies any arm or leg weakness or numbness, no facial droop, no facial numbness, no speech issues, no slurred speech. Endorses difficulty with vision on the left, this started a few days ago.   MRI shows a right PCA territory stroke.   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 No significant mass effect at this time. No evidence of hemorrhagic conversion. 2. 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 3. Background moderate chronic small vessel ischemic changes within the cerebral white matter. 4. Mild generalized parenchymal atrophy.  CTA head   1.  Occlusion of a P3 segment right posterior cerebral artery branch. 2. Severe stenosis of a right posterior cerebral artery branch at the P2/P3 junction. 3. Severe stenosis within the P2 segment of the left posterior cerebral artery. 4. Reconstitution of enhancement within the intracranial right vertebral artery, likely due to retrograde flow. 5. Mild/moderate stenosis within the V4 left vertebral artery. 6. 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 7. Moderate stenosis of the right anterior cerebral artery A1 segment. 8. Atherosclerotic plaque within the intracranial internal carotid arteries bilaterally with no more than mild stenosis.   CTA NECK 1. The non-dominant right vertebral artery becomes occluded at the C1-skull base level. 2. The dominant left vertebral artery is patent throughout the neck without stenosis. 3. Common and internal  carotid arteries patent within the neck bilaterally without stenosis. 4. 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  MRI   Multifocal acute ischemia within both cerebral hemispheres, right worse than left. Ischemia in the left middle cerebellar peduncle and inferior left cerebellum may be slightly older but is still in the recent subacute phase. No hemorrhage or mass effect.   LDL NOT CALCULATED HgbA1c 6.1 VTE prophylaxis - SCD    Diet   DIET DYS 2 Room service appropriate? No; Fluid consistency: Thin   No antithrombotic prior to admission, now on aspirin 81 mg daily and clopidogrel 75 mg daily.  For 3 months followed by aspirin alone Therapy recommendations:  CIR Disposition:  pending  Hypertension Home meds:  amlodipine Stable Permissive hypertension (OK if < 220/120) but gradually normalize in 5-7 days Long-term BP goal normotensive  Hyperlipidemia Home meds:  no  LDL NOT CALCULATED, goal < 70 Results for TACARI, REPASS (MRN [id number]) as of 04/05/2021 12:03  Ref. Range 04/05/2021 02:00  Total CHOL/HDL Ratio Latest Units: RATIO NOT CALCULATED  Cholesterol Latest Ref Range: 0 - 200 mg/dL 82  HDL Cholesterol Latest Ref Range: >40 mg/dL <10 (L)  LDL (calc) Latest Ref Range: 0 - 99 mg/dL NOT CALCULATED  Triglycerides Latest Ref Range: <150 mg/dL 91  VLDL Latest Ref Range: 0 - 40 mg/dL 18   HgbA1c 6.1, goal < 7.0 CBGs No results for input(s): GLUCAP in the last 72 hours.  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 CKD IIIa  - SCr is 1.63 on admission, up from last month in setting of hypovolemia  - Continue IVF hydration, renally-dose medications, monitor     4. OSA  - Continue CPAP qHS     5. Hypertension  - BP well controlled at home off of antihypertensives for the past couple weeks  - Treat as-needed only for now     6. Depression  - Hold Prozac initially in light of somnolence and diarrhea    7. N/V/D  -  Abd exam benign and no bowel wall thickening or inflammatory change on CT  - Denies N/V/D since arrival in ED 8 hrs ago  - Continue IVF hydration, monitor electrolytes     8. Elevated transaminases  - Transaminases slightly elevated with normal alk phos and bilirubin  - No acute hepatobiliary abnormality on CT in ED  - Hold statin initially and trend        Hospital day # 4   He presented with confusion, headache and vertigo and MRI shows right PCA, left cerebellar and left periventricular infarcts with CT angiogram showing diffuse multivessel intracranial atherosclerosis.  Neurological exam shows left   homonymous hemianopsia .  Recommend dual antiplatelet therapy aspirin Plavix for 3 months followed by aspirin alone and aggressive risk factor modification.  Patient advised not to drive till his vision improves.  Continue ongoing therapies.  His insurance is not in network with [facility] so he will need inpatient rehab at an outside facility.  Blood cultures have been negative so far.  Long discussion over the phone with the patient's wife and answered questions.  Discussed with Dr. Avon Gully and patient's wife and answered questions greater than 50% time during t this 25-minute visit was spent on counseling and coordination of care and discussion with care team and  answering questions.  Antony Contras, MD Medical Director [facility] Pager: [phone] 04/06/2021 1:58 PM   To contact Stroke Continuity provider, please refer to [email or web address] After hours, contact General Neurology

## 2021-04-06 NOTE — Evaluation (Signed)
Speech Language Pathology Evaluation Patient Details Name: Nicholas Mckenzie MRN: [id number] DOB: [birth year] Today's Date: 04/06/2021 Time: 3710-6269 SLP Time Calculation (min) (ACUTE ONLY): 21 min  Problem List:  Patient Active Problem List   Diagnosis Date Noted   Goals of care, counseling/discussion 04/03/2021   Acute encephalopathy 04/02/2021   Ureteral stent occlusion, sequela 04/02/2021   Elevated transaminase level 04/02/2021   CKD (chronic kidney disease) stage 3a 03/11/2021   Major depressive disorder, recurrent episode, in partial remission (HCC) 03/11/2021   Hypertension    Sleep apnea    Past Medical History:  Past Medical History:  Diagnosis Date   CKD (chronic kidney disease) stage 3, GFR 30-59 ml/min (HCC)    Hypertension    Kidney stones    Sleep apnea    Past Surgical History:  Past Surgical History:  Procedure Laterality Date   CATARACT EXTRACTION     kidney stent     MANDIBLE FRACTURE SURGERY     HPI:  75yo male admitted 04/01/21 with malaise, lightheadedness, confusion, dysuria, n/v, UTI d/t R ureteral stent obstruction (surgery now on hold due to infection). 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 PMH: CKD3, HTN, kidney stones, OSA. 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   Assessment / Plan / Recommendation Clinical Impression  Pt was seen at bedside for assessment of cognitive linguistic function. He reports residual cognitive deficits from his previous stroke, including memory and decision making. Pt presents with mild-moderate cognitive deficits, including decreased mental math, thought organization, and problem solving. Pt had difficulty with reading and writing tests due to reduced visual acuity. Pt also reports orofacial numbness of the forehead, left cheek, and tongue. Speech continues to be fully intelligible, and is slightly clearer than yesterday. Pt reports he is independent with med administration at home. His wife manages the  finances and household responsibilities. Given residual and current deficits, continued ST intervention is recommended to maximize safety and independence at home. Will continue to follow pt acutely.    SLP Assessment  SLP Recommendation/Assessment: Patient needs continued Speech Language Pathology Services  SLP Visit Diagnosis: Cognitive communication deficit (R41.841)    Recommendations for follow up therapy are one component of a multi-disciplinary discharge planning process, led by the attending physician.  Recommendations may be updated based on patient status, additional functional criteria and insurance authorization.    Follow Up Recommendations  Other (comment) (TBD)    Frequency and Duration min 1 x/week  2 weeks      SLP Evaluation Cognition  Overall Cognitive Status: History of cognitive impairments - at baseline Arousal/Alertness: Awake/alert Orientation Level: Oriented to person;Oriented to place;Oriented to situation (pt independently utilized environmental cues for day/date)       Comprehension  Auditory Comprehension Overall Auditory Comprehension: Appears within functional limits for tasks assessed Reading Comprehension Reading Status: Impaired Interfering Components: Visual acuity    Expression Expression Primary Mode of Expression: Verbal Verbal Expression Overall Verbal Expression: Appears within functional limits for tasks assessed Written Expression Dominant Hand: Right Written Expression: Exceptions to [facility] (pt's right (dominant) hand is weak)   Oral / Motor  Oral Motor/Sensory Function Overall Oral Motor/Sensory Function: Mild impairment Facial ROM: Reduced left Facial Symmetry: Abnormal symmetry left Facial Strength: Reduced left Facial Sensation: Reduced left Lingual ROM: Within Functional Limits Lingual Symmetry: Within Functional Limits Lingual Strength: Reduced Lingual Sensation: Reduced Velum: Within Functional Limits Mandible: Within  Functional Limits Motor Speech Overall Motor Speech: Impaired Articulation: Impaired Level of Impairment: Conversation Intelligibility: Intelligible  GO                   Verba Ainley B. Murvin Natal, [facility], CCC-SLP Speech Language Pathologist Office: [phone]  [name] 04/06/2021, 2:00 PM

## 2021-04-06 NOTE — Progress Notes (Signed)
Speech Language Pathology Treatment: Dysphagia  Patient Details Name: Nicholas Mckenzie MRN: [id number] DOB: 1945/12/13 Today's Date: 04/06/2021 Time: 8115-7262 SLP Time Calculation (min) (ACUTE ONLY): 15 min  Assessment / Plan / Recommendation Clinical Impression  Pt seen for follow up after BSE completed 04/05/21. Pt was seated upright in recliner, finishing lunch. Pt reports no difficulty swallowing, and indicates he prefers the dys2 (finely chopped) consistency at this time. Pt was observed drinking water from a bottle. Left anterior leakage and cough response was noted. When pt was given a straw and encouraged to place the straw on the right side of the mouth, no cough and no anterior leakage occurred.  Will continue dys 2 diet with thin liquids. Safe swallow precautions posted at [facility]. SLP will continue to follow acutely to assess readiness to advance solids.   HPI HPI: 75yo male admitted 04/01/21 with malaise, lightheadedness, confusion, dysuria, n/v, UTI d/t R ureteral stent obstruction (surgery now on hold due to infection). 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 PMH: CKD3, HTN, kidney stones, OSA      SLP Plan  Continue with current plan of care      Recommendations for follow up therapy are one component of a multi-disciplinary discharge planning process, led by the attending physician.  Recommendations may be updated based on patient status, additional functional criteria and insurance authorization.    Recommendations  Diet recommendations: Dysphagia 2 (fine chop);Thin liquid Liquids provided via: Straw;Cup Medication Administration: Whole meds with liquid Supervision: Patient able to self feed Compensations: Slow rate;Small sips/bites;Minimize environmental distractions Postural Changes and/or Swallow Maneuvers: Seated upright 90 degrees;Upright 30-60 min after meal                Oral Care Recommendations: Oral care BID Follow up Recommendations: Other (comment) (TBD) SLP Visit  Diagnosis: Dysphagia, unspecified (R13.10) Plan: Continue with current plan of care       GO               Nicholas Mckenzie B. Murvin Natal, [facility], CCC-SLP Speech Language Pathologist Office: [phone]  [name]  04/06/2021, 1:41 PM

## 2021-04-07 ENCOUNTER — Inpatient Hospital Stay (HOSPITAL_COMMUNITY): Payer: Medicare (Managed Care)

## 2021-04-07 DIAGNOSIS — G9340 Encephalopathy, unspecified: Secondary | ICD-10-CM | POA: Diagnosis not present

## 2021-04-07 DIAGNOSIS — I10 Essential (primary) hypertension: Secondary | ICD-10-CM | POA: Diagnosis not present

## 2021-04-07 HISTORY — PX: IR NEPHROSTOMY PLACEMENT RIGHT: IMG6064

## 2021-04-07 IMAGING — XA IR NEPHROSTOMY PLACEMENT RIGHT
3 series · 4 of 4 positions shown · non-contrast
Comparison: None.

INDICATION: Chronic right-sided urinary obstruction secondary to right-sided
double-J ureteral catheter which has not been exchanged for many
years, presently with a nonfunctional right kidney however not a
candidate for nephro ureterectomy secondary to recent stroke and
dual platelet anticoagulation.

As such, request made for placement of a right-sided percutaneous
nephrostomy catheter for infection source control purposes.
EXAM:
ULTRASOUND AND FLUOROSCOPIC GUIDED PLACEMENT OF LEFT NEPHROSTOMY
TUBE

[Series 1: fl (-) angio · 1 of 1 slices shown (1 of 2)]
[im 1/1]
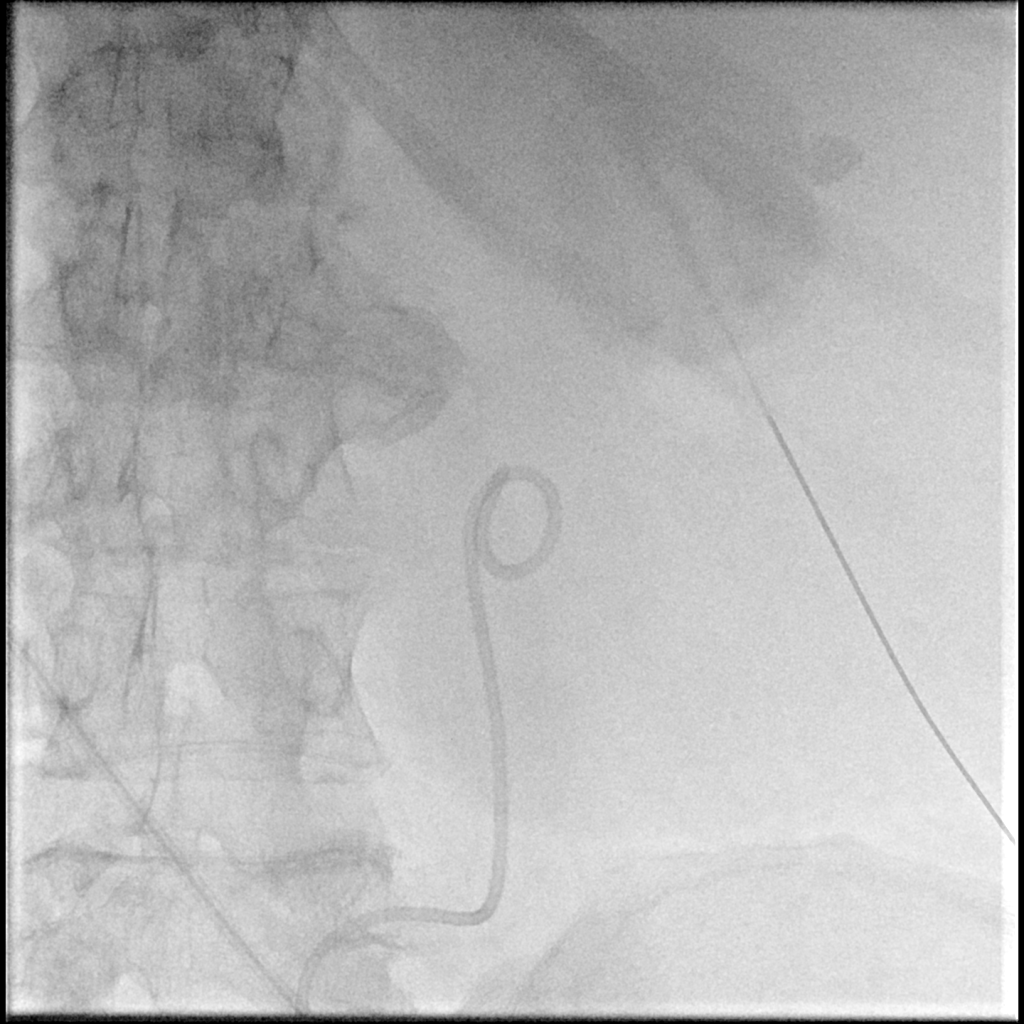

[Series 1: ir nephrostomy placement right · 2 of 2 slices shown]
[im 1/2]
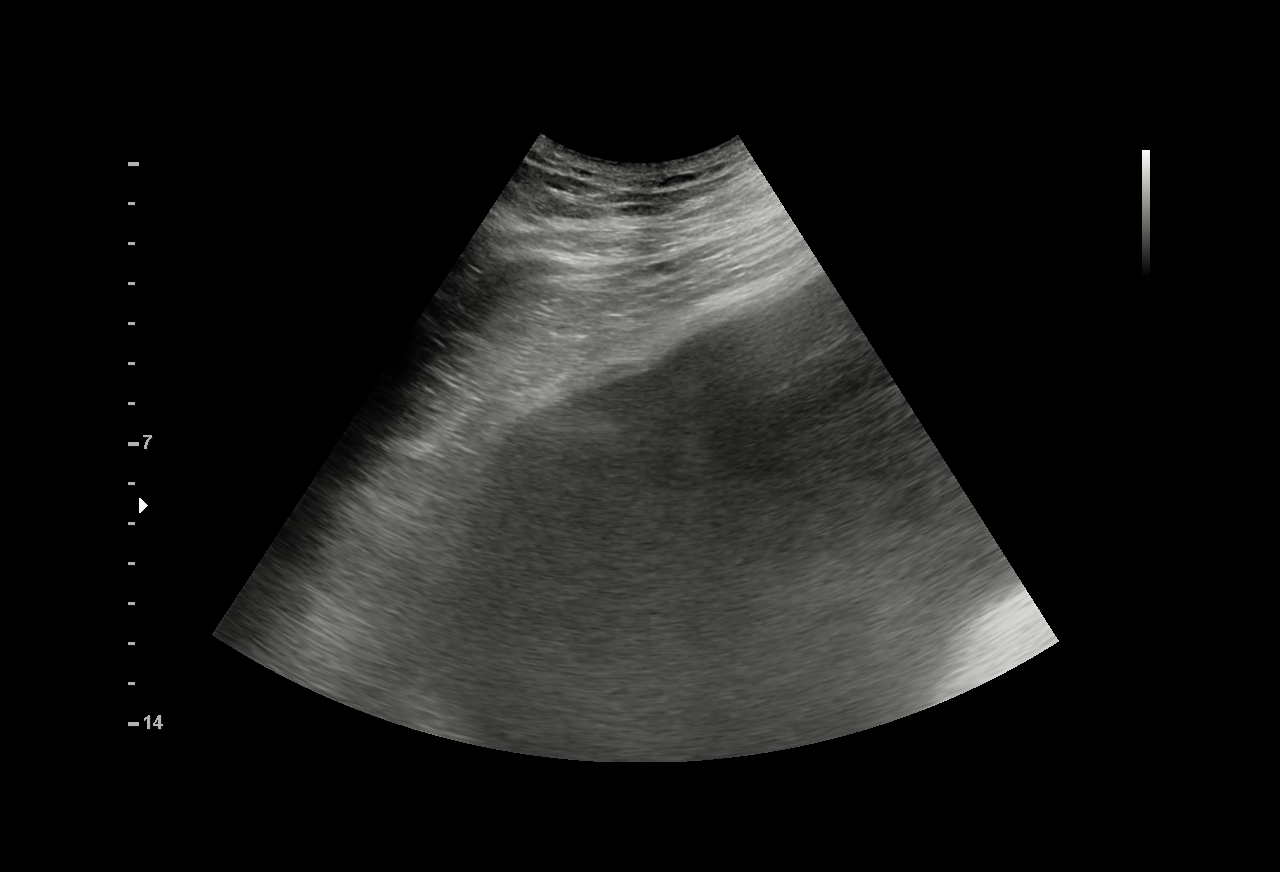
[im 2/2]
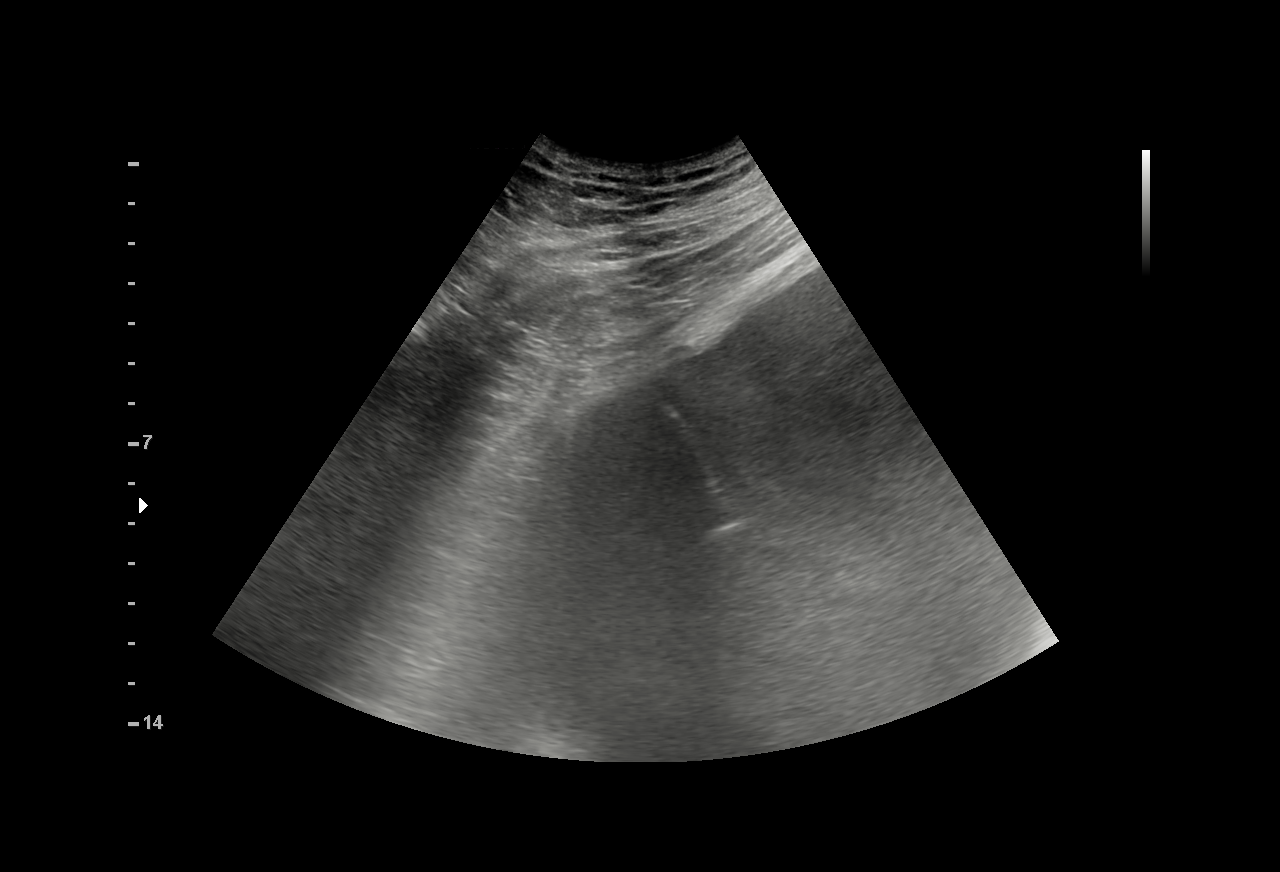

[Series 2: fl (-) angio · 1 of 1 slices shown (2 of 2)]
[im 1/1]
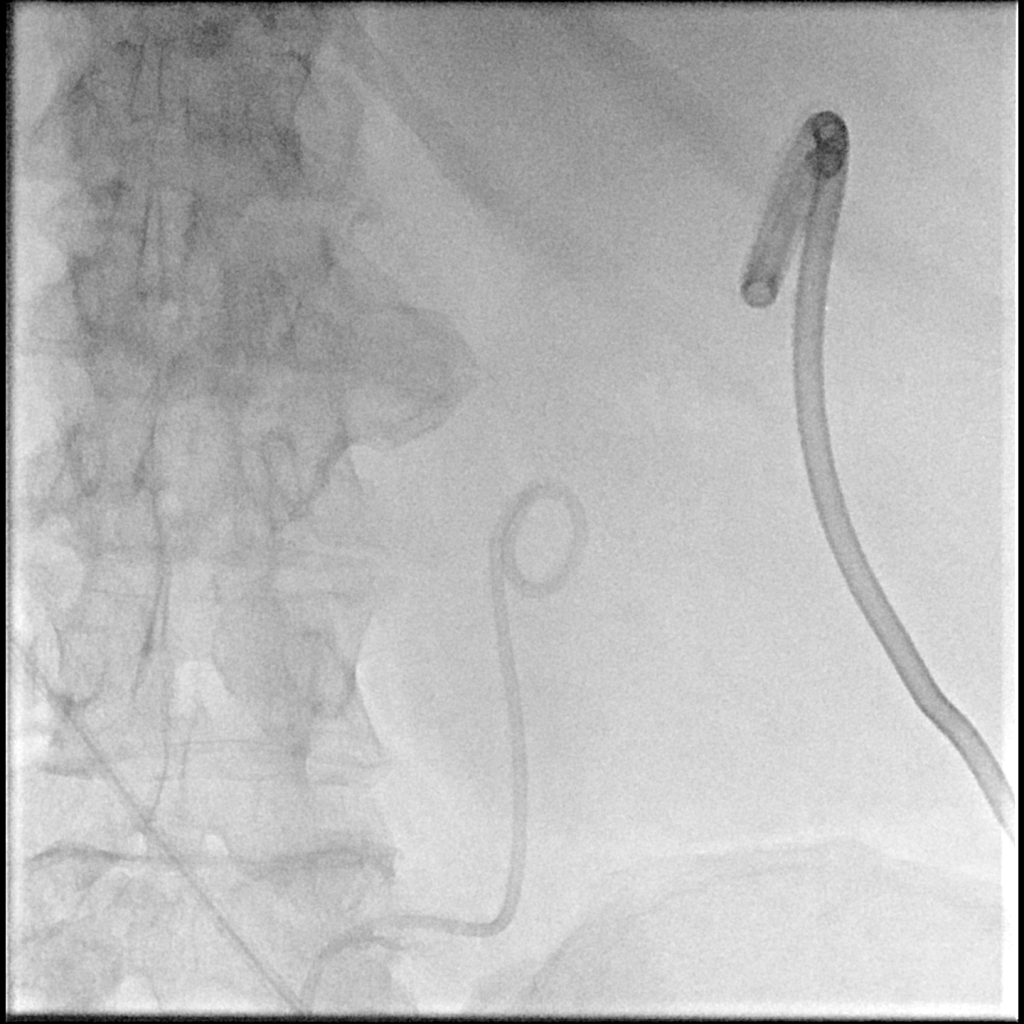

[4 of 4 positions shown; findings below may reference images not displayed]

MEDICATIONS:
Ciprofloxacin 400 mg IV; The antibiotic was administered in an
appropriate time frame prior to skin puncture.

ANESTHESIA/SEDATION:
Moderate (conscious) sedation was employed during this procedure as
administered by the [REDACTED].

A total of Versed 1.5 mg and Fentanyl 75 mcg was administered
intravenously.

Moderate Sedation Time: 32 minutes. The patient's level of
consciousness and vital signs were monitored continuously by
radiology nursing throughout the procedure under my direct
supervision.

CONTRAST:  10 cc mL Isovue 300 - administered into the renal
collecting system

FLUOROSCOPY TIME:  18 seconds (8 mGy)

COMPLICATIONS:
None immediate.

PROCEDURE:
The procedure, risks, benefits, and alternatives were explained to
the patient, questions were encouraged and answered and informed
consent was obtained. A timeout was performed prior to the
initiation of the procedure.

The operative site was prepped and draped in the usual sterile
fashion and a sterile drape was applied covering the operative
field. A sterile gown and sterile gloves were used for the
procedure. Local anesthesia was provided with 1% Lidocaine with
epinephrine. Ultrasound was used to localize the right kidney. Under
direct ultrasound guidance, a 20 gauge needle was advanced into the
renal collecting system. An ultrasound image documentation was
performed. Access within the collecting system was confirmed with
the efflux of purulent urine followed by limited contrast injection.

Under intermittent fluoroscopic guidance, an 0.018 wire was advanced
into the collecting system and the tract was dilated with an
Accustick stent. Next, over a short Amplatz wire, the track was
further dilated ultimately allowing placement of a 14-French
percutaneous nephrostomy catheter with end coiled and locked within
the renal pelvis.

Following nephrostomy catheter placement, 3 L of purulent urine was
aspirated. A small amount of aspirated purulent urine was capped and
sent to the laboratory for analysis.

A postprocedural spot fluoroscopic images were obtained in various
obliquities. The catheter was secured at the skin entrance site with
an interrupted suture and a stat lock device and connected to a
gravity bag. Dressings were applied. The patient tolerated procedure
well without immediate postprocedural complication.
FINDINGS: Ultrasound scanning demonstrates a severe dilatation of the right
kidney without any identifiable renal parenchyma.

Under a combination of ultrasound fluoroscopic guidance, the right
renal pelvis was accessed allowing placement of a 14 French
percutaneous nephrostomy catheter.
IMPRESSION: Successful ultrasound and fluoroscopic guided placement of a right
sided 14 French PCN yielding 3 L of purulent urine. A small amount
of aspirated purulent urine was capped and sent to the laboratory
for analysis.

Note, as this kidney is nonfunctional, there is likely to be little
to no output from the nephrostomy catheter following evacuation of
the remainder of the infected purulent urine.

Routine nephrostomy catheter exchange may be performed in 6-8 weeks
unless nephroureteral intervention is pursued in the interval.

## 2021-04-07 MED ORDER — FENTANYL CITRATE (PF) 100 MCG/2ML IJ SOLN
INTRAMUSCULAR | Status: DC | PRN
  Administered 2021-04-07: 50 ug via INTRAVENOUS
  Administered 2021-04-07: 25 ug via INTRAVENOUS

## 2021-04-07 MED ORDER — SODIUM CHLORIDE 0.9% FLUSH
5.0000 mL | Freq: Three times a day (TID) | INTRAVENOUS | Status: DC
  Administered 2021-04-07 – 2021-04-14 (×16): 5 mL

## 2021-04-07 MED ORDER — LIDOCAINE HCL 1 % IJ SOLN
INTRAMUSCULAR | Status: AC
  Administered 2021-04-07: 10 mL via SUBCUTANEOUS
  Filled 2021-04-07: qty 20

## 2021-04-07 MED ORDER — MIDAZOLAM HCL 2 MG/2ML IJ SOLN
INTRAMUSCULAR | Status: DC | PRN
  Administered 2021-04-07: 1 mg via INTRAVENOUS
  Administered 2021-04-07: .5 mg via INTRAVENOUS

## 2021-04-07 MED ORDER — FENTANYL CITRATE (PF) 100 MCG/2ML IJ SOLN
INTRAMUSCULAR | Status: AC
  Filled 2021-04-07: qty 4

## 2021-04-07 MED ORDER — IOHEXOL 300 MG/ML  SOLN: 100.0000 mL | Freq: Once | INTRAMUSCULAR | Status: DC | PRN

## 2021-04-07 MED ORDER — MIDAZOLAM HCL 2 MG/2ML IJ SOLN
INTRAMUSCULAR | Status: AC
  Filled 2021-04-07: qty 4

## 2021-04-07 NOTE — Progress Notes (Addendum)
PROGRESS NOTE  Nicholas Mckenzie ULA:[id number] DOB: 10/11/1945 DOA: 04/01/2021 PCP: Eartha Inch, MD  HPI/Recap of past 24 hours:  75 y.o. male with medical history significant for hypertension, nephrolithiasis, OSA on CPAP, chronic renal insufficiency, and depression, now presenting to emergency department for evaluation of fatigue, lightheadedness, dysuria, confusion, nausea, vomiting, and loose stools. Previously discharged 3 weeks ago from polypharmacy/accidental overdose. Who present with worsening malaise/weakness found to have UTI in the setting of R ureteral stent obstruction.  Seen by urology, plan for percutaneous nephrostomy by IR on 04/07/2021.  Hospital course complicated by acute/subacute CVA involving the right PCA territory cortical/subcortical infarct within the right parietal occipital lobes, chronic lacunar infarcts within the right basal ganglia for which neurology/stroke team was consulted.  04/07/2021: Patient was seen and examined at his bedside.  Family members at bedside.  He has no new complaints.  He was getting ready to go to IR.    Assessment/Plan: Principal Problem:   Acute encephalopathy Active Problems:   CKD (chronic kidney disease) stage 3a   Hypertension   Sleep apnea   Major depressive disorder, recurrent episode, in partial remission (HCC)   Ureteral stent occlusion, sequela   Elevated transaminase level   Goals of care, counseling/discussion  Acute/subacute CVA involving right PCA territory, both cerebral hemisphere, right worse than left, confirmed on MRI brain 04/13/2021. Cortical/subcortical infarct within the right parietal occipital lobes Ischemia in the left middle cerebellar peduncle and inferior left cerebellum - Confirmed on MRI --Neurology following, appreciate insight and recommendations --Continue aspirin 81 Plavix 75 for 3 months followed by aspirin alone -recommending against any breaks in therapy unless for urgent or emergent issues  procedures or surgery Hemoglobin A1c 6.1, goal less than 7.0. LDL, not calculated. --PT/OT/SLP -potential CIR candidate pending below   Complicated Enterobacter cloacae/Aerococcus species UTI in the setting of right ureteral stent obstruction.   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 Plan for percutaneous nephrostomy by IR on 04/07/2021.  Ascending aortic aneurysm 4.5 cm seen on 2D echo Recommendation for follow-up CTA Keep BP normotensive.  Resolved acute metabolic encephalopathy -Likely multifactorial in the setting of infection, complicated by acute stroke  -Infection likely primary driving source given worsening confusion up to 10 days prior to admission, well before strokelike symptoms   Nausea vomiting diarrhea, ongoing but improving Continue supportive care.  Transaminitis, resolved -Likely in setting of poor p.o. intake -LFTs downtrending appropriately   CKD stage IIIa -Resolving with IV fluids, increase p.o. intake as appropriate Appears to be back to his baseline creatinine 1.44 with GFR 51. Continue to monitor urine output and renal function Continue to avoid nephrotoxic agents  Obstructive sleep apnea -continue CPAP nightly  Essential hypertension  BP is currently at goal. Continue Norvasc 5 mg daily Continue to monitor vital signs  Chronic anxiety/depression Prozac held on admission due to somnolence  Restart once mentation is returning to baseline.  Obesity: Body mass index is 31.66 kg/m.  Complicates overall care and prognosis.  Recommend lifestyle modifications including physical activity and diet for weight loss and overall long-term health.     DVT prophylaxis: Subcu Lovenox daily. Code Status: Full Family Communication: Wife updated at bedside, lengthy discussion about current infection, stroke possible need for procedure and  further imaging   Status is: Inpatient   Dispo: The patient is from: Home              Anticipated  d/c is to: CIR              Anticipated d/c date is: > 72 hours              Patient currently not medically stable for discharge   Consultants:  Urology, neurology   Antimicrobials:  Ceftriaxone           Objective: Vitals:   04/07/21 0945 04/07/21 0950 04/07/21 0955 04/07/21 1000  BP: 135/72 136/74 136/73 131/71  Pulse: 80 82 80 78  Resp: 14 16 16 16   Temp:      TempSrc:      SpO2: 99% 99% 99% 97%  Weight:      Height:        Intake/Output Summary (Last 24 hours) at 04/07/2021 1004 Last data filed at 04/07/2021 0959 Gross per 24 hour  Intake --  Output 4150 ml  Net -4150 ml   Filed Weights   04/02/21 1500 04/03/21 1957 04/03/21 2000  Weight: (P) 94.1 kg 95.7 kg 94.4 kg    Exam:  General: 75 y.o. year-old male well developed well nourished in no acute distress.  Alert and interactive. Cardiovascular: Regular rate and rhythm with no rubs or gallops.  No thyromegaly or JVD noted.   Respiratory: Clear to auscultation with no wheezes or rales. Good inspiratory effort. Abdomen: Soft nontender nondistended with normal bowel sounds x4 quadrants. Musculoskeletal: No lower extremity edema. 2/4 pulses in all 4 extremities. Skin: No ulcerative lesions noted or rashes, Psychiatry: Mood is appropriate for condition and setting   Data Reviewed: CBC: Recent Labs  Lab 04/01/21 2255 04/02/21 0559 04/03/21 0600 04/04/21 0311 04/05/21 0200 04/06/21 0443  WBC 12.0* 8.7 15.7* 17.3* 15.0* 14.6*  NEUTROABS 10.6*  --   --   --   --   --   HGB 11.8* 11.7* 11.5* 10.9* 10.8* 11.0*  HCT 37.0* 36.2* 34.6* 32.8* 33.0* 33.2*  MCV 89.8 88.7 85.9 85.0 85.9 86.0  PLT 340 403* 360 352 323 305   Basic Metabolic Panel: Recent Labs  Lab 04/02/21 0559 04/03/21 0600 04/04/21 0311 04/05/21 0200 04/06/21 0443  NA 144 140 140 140 139  K 3.6 3.7 3.4* 3.4* 3.7  CL 107 108 107  108 107  CO2 24 23 23  21* 22  GLUCOSE 117* 126* 131* 124* 129*  BUN 37* 31* 29* 24* 24*  CREATININE 1.59* 1.51* 1.67* 1.60* 1.44*  CALCIUM 9.2 8.7* 8.3* 8.5* 8.5*  MG  --   --  1.9  --   --    GFR: Estimated Creatinine Clearance: 50.9 mL/min (A) (by C-G formula based on SCr of 1.44 mg/dL (H)). Liver Function Tests: Recent Labs  Lab 04/01/21 2255 04/02/21 0559 04/03/21 0600 04/04/21 0311  AST 42* 32 32 26  ALT 46* 41 42 35  ALKPHOS 75 74 70 71  BILITOT 0.8 0.6 0.4 0.7  PROT 6.2* 6.2* 5.7* 5.8*  ALBUMIN 2.2* 2.3* 1.9* 2.1*   Recent Labs  Lab 04/01/21 2255  LIPASE 40   No results for input(s): AMMONIA in the last 168 hours. Coagulation Profile: Recent Labs  Lab 04/06/21 1506  INR 1.2   Cardiac Enzymes: No results for input(s): CKTOTAL, CKMB, CKMBINDEX, TROPONINI in the last 168 hours. BNP (last 3 results) No results for input(s): PROBNP in the last 8760 hours. HbA1C: Recent Labs    04/05/21 0200  HGBA1C 6.1*   CBG: No results for input(s): GLUCAP in the last 168 hours. Lipid Profile: Recent Labs  04/05/21 0200  CHOL 82  HDL <10*  LDLCALC NOT CALCULATED  TRIG 91  CHOLHDL NOT CALCULATED   Thyroid Function Tests: No results for input(s): TSH, T4TOTAL, FREET4, T3FREE, THYROIDAB in the last 72 hours. Anemia Panel: No results for input(s): VITAMINB12, FOLATE, FERRITIN, TIBC, IRON, RETICCTPCT in the last 72 hours. Urine analysis:    Component Value Date/Time   COLORURINE YELLOW 04/02/2021 0230   APPEARANCEUR HAZY (A) 04/02/2021 0230   LABSPEC 1.016 04/02/2021 0230   PHURINE 5.0 04/02/2021 0230   GLUCOSEU NEGATIVE 04/02/2021 0230   HGBUR NEGATIVE 04/02/2021 0230   BILIRUBINUR NEGATIVE 04/02/2021 0230   KETONESUR 5 (A) 04/02/2021 0230   PROTEINUR 30 (A) 04/02/2021 0230   NITRITE NEGATIVE 04/02/2021 0230   LEUKOCYTESUR LARGE (A) 04/02/2021 0230   Sepsis Labs: @LABRCNTIP (procalcitonin:4,lacticidven:4)  ) Recent Results (from the past 240 hour(s))   Resp Panel by RT-PCR (Flu A&B, Covid) Nasopharyngeal Swab     Status: None   Collection Time: 04/01/21  8:00 PM   Specimen: Nasopharyngeal Swab; Nasopharyngeal(NP) swabs in vial transport medium  Result Value Ref Range Status   SARS Coronavirus 2 by RT PCR NEGATIVE NEGATIVE Final    Comment: (NOTE) SARS-CoV-2 target nucleic acids are NOT DETECTED.  The SARS-CoV-2 RNA is generally detectable in upper respiratory specimens during the acute phase of infection. The lowest concentration of SARS-CoV-2 viral copies this assay can detect is 138 copies/mL. A negative result does not preclude SARS-Cov-2 infection and should not be used as the sole basis for treatment or other patient management decisions. A negative result may occur with  improper specimen collection/handling, submission of specimen other than nasopharyngeal swab, presence of viral mutation(s) within the areas targeted by this assay, and inadequate number of viral copies(<138 copies/mL). A negative result must be combined with clinical observations, patient history, and epidemiological information. The expected result is Negative.  Fact Sheet for Patients:  06/01/21  Fact Sheet for Healthcare Providers:  BloggerCourse.com  This test is no t yet approved or cleared by the [employer] FDA and  has been authorized for detection and/or diagnosis of SARS-CoV-2 by FDA under an Emergency Use Authorization (EUA). This EUA will remain  in effect (meaning this test can be used) for the duration of the COVID-19 declaration under Section 564(b)(1) of the Act, 21 U.S.C.section 360bbb-3(b)(1), unless the authorization is terminated  or revoked sooner.       Influenza A by PCR NEGATIVE NEGATIVE Final   Influenza B by PCR NEGATIVE NEGATIVE Final    Comment: (NOTE) The Xpert Xpress SARS-CoV-2/FLU/RSV plus assay is intended as an aid in the diagnosis of influenza from  Nasopharyngeal swab specimens and should not be used as a sole basis for treatment. Nasal washings and aspirates are unacceptable for Xpert Xpress SARS-CoV-2/FLU/RSV testing.  Fact Sheet for Patients: [place]  Fact Sheet for Healthcare Providers: BloggerCourse.com  This test is not yet approved or cleared by the [employer] FDA and has been authorized for detection and/or diagnosis of SARS-CoV-2 by FDA under an Emergency Use Authorization (EUA). This EUA will remain in effect (meaning this test can be used) for the duration of the COVID-19 declaration under Section 564(b)(1) of the Act, 21 U.S.C. section 360bbb-3(b)(1), unless the authorization is terminated or revoked.  Performed at [facility], [address] [place], [place]   Urine Culture     Status: Abnormal   Collection Time: 04/02/21  2:41 AM   Specimen: Urine, Clean Catch  Result Value  Ref Range Status   Specimen Description   Final    URINE, CLEAN CATCH Performed at [facility] [address], [place], [place] [address]    Special Requests   Final    NONE Performed at [facility], [address] [place], [place] [address]    Culture (A)  Final    >=100,000 COLONIES/mL ENTEROBACTER CLOACAE 50,000 COLONIES/mL AEROCOCCUS SPECIES Standardized susceptibility testing for this organism is not available. Performed at [facility], [address] [place], [place] [address]    Report Status 04/05/2021 FINAL  Final   Organism ID, Bacteria ENTEROBACTER CLOACAE (A)  Final      Susceptibility   Enterobacter cloacae - MIC*    CEFAZOLIN >=64 RESISTANT Resistant     CEFEPIME <=0.12 SENSITIVE Sensitive     CIPROFLOXACIN <=0.25 SENSITIVE Sensitive     GENTAMICIN <=1 SENSITIVE Sensitive     IMIPENEM <=0.25 SENSITIVE Sensitive     NITROFURANTOIN 64 INTERMEDIATE Intermediate      TRIMETH/SULFA >=320 RESISTANT Resistant     PIP/TAZO 8 SENSITIVE Sensitive     * >=100,000 COLONIES/mL ENTEROBACTER CLOACAE  Culture, blood (routine x 2)     Status: None (Preliminary result)   Collection Time: 04/02/21 11:24 PM   Specimen: BLOOD  Result Value Ref Range Status   Specimen Description   Final    BLOOD BLOOD RIGHT HAND Performed at [facility], [name] [place], [place] [address]    Special Requests   Final    BOTTLES DRAWN AEROBIC AND ANAEROBIC Blood Culture adequate volume Performed at [facility], [address] [place], [place] [address]    Culture   Final    NO GROWTH 4 DAYS Performed at [facility], [address], [place], [place] [address]    Report Status PENDING  Incomplete  Culture, blood (routine x 2)     Status: None (Preliminary result)   Collection Time: 04/02/21 11:26 PM   Specimen: BLOOD  Result Value Ref Range Status   Specimen Description   Final    BLOOD RIGHT ANTECUBITAL Performed at [facility], [address], [place], [place] [address]    Special Requests   Final    BOTTLES DRAWN AEROBIC AND ANAEROBIC Blood Culture adequate volume Performed at [facility], [address] [place], [place] [address]    Culture   Final    NO GROWTH 4 DAYS Performed at [facility], [address] [place], [place] [address]    Report Status PENDING  Incomplete      Studies: No results found.  Scheduled Meds:  amLODipine  5 mg Oral Daily   aspirin  81 mg Oral Daily   Or   aspirin  300 mg Rectal Daily   clopidogrel  75 mg Oral Daily   enoxaparin (LOVENOX) injection  40 mg Subcutaneous Q24H   fentaNYL       lactobacillus  1 g Oral BID   midazolam        Continuous Infusions:  cefTRIAXone (ROCEPHIN)  IV 1 g (04/07/21 0202)   promethazine (PHENERGAN) injection (IM or IVPB)       LOS: 5 days     Darlin Drop, MD [facility] Pager  [phone]  If 7PM-7AM, please contact night-coverage [email or web address] Password [email or web address] 04/07/2021, 10:04 AM

## 2021-04-07 NOTE — Procedures (Addendum)
Pre Procedure Dx: Hydronephrosis Post Procedural Dx: Same  Successful [place] and fluoroscopic guided placement of a right sided PCN with end coiled and locked within the renal pelvis. 3 L of purulent fluid aspirated following PCN placement.  Sample sent to lab for analysis. PCN connected to gravity bag.  EBL: None  Complications: None immediate.  Katherina Right, MD Pager #: [phone]

## 2021-04-07 NOTE — Progress Notes (Signed)
STROKE TEAM PROGRESS NOTE   INTERVAL HISTORY Patient's wife and friends are at the bedside.  He is sedated with pain medications following ultrasound and fluoroscopic guided placement of right PCN which has drained 3 L of purulent fluid.  He is on BiPAP.  He is drowsy but can be aroused and follows simple commands and moves all 4 extremities purposefully.  Vital signs are stable.  Neurological exam unchanged limited due to sedation Vitals:   04/07/21 0955 04/07/21 1000 04/07/21 1010 04/07/21 1058  BP: 136/73 131/71 129/72 123/70  Pulse: 80 78 78 72  Resp: '16 16 14 14  ' Temp:    97.7 F (36.5 C)  TempSrc:    Oral  SpO2: 99% 97% 95% 96%  Weight:      Height:       CBC:  Recent Labs  Lab 04/01/21 2255 04/02/21 0559 04/05/21 0200 04/06/21 0443  WBC 12.0*   < > 15.0* 14.6*  NEUTROABS 10.6*  --   --   --   HGB 11.8*   < > 10.8* 11.0*  HCT 37.0*   < > 33.0* 33.2*  MCV 89.8   < > 85.9 86.0  PLT 340   < > 323 305   < > = values in this interval not displayed.   Basic Metabolic Panel:  Recent Labs  Lab 04/04/21 0311 04/05/21 0200 04/06/21 0443  NA 140 140 139  K 3.4* 3.4* 3.7  CL 107 108 107  CO2 23 21* 22  GLUCOSE 131* 124* 129*  BUN 29* 24* 24*  CREATININE 1.67* 1.60* 1.44*  CALCIUM 8.3* 8.5* 8.5*  MG 1.9  --   --     Lipid Panel:  Recent Labs  Lab 04/05/21 0200  CHOL 82  TRIG 91  HDL <10*  CHOLHDL NOT CALCULATED  VLDL 18  LDLCALC NOT CALCULATED    HgbA1c:  Recent Labs  Lab 04/05/21 0200  HGBA1C 6.1*   Urine Drug Screen: No results for input(s): LABOPIA, COCAINSCRNUR, LABBENZ, AMPHETMU, THCU, LABBARB in the last 168 hours.  Alcohol Level No results for input(s): ETH in the last 168 hours.  IMAGING past 24 hours No results found.  PHYSICAL EXAM Pleasant elderly Caucasian male not in distress. . Afebrile. Head is nontraumatic. Neck is supple without bruit.    Cardiac exam no murmur or gallop. Lungs are clear to auscultation. Distal pulses are well felt.    Mental status/Cognition: Drowsy due to sedation but can be aroused and follows commands well.   Speech/language: Fluent, comprehension intact, object naming intact, repetition intact.  Cranial nerves:   CN II Pupils equal and reactive to light, Left homonymous hemianopsia   CN III,IV,VI EOM intact, no gaze preference or deviation, no nystagmus    CN V normal sensation in V1, V2, and V3 segments bilaterally    CN VII no asymmetry, no nasolabial fold flattening    CN VIII normal hearing to speech    CN IX & X normal palatal elevation, no uvular deviation    CN XI 5/5 head turn and 5/5 shoulder shrug bilaterally    CN XII midline tongue protrusion     motor: muscle bulk normal, tone normal, no pronator drift seen. Strength: 5/5 throughout  Sensation: intact throughout to light touch.   ASSESSMENT/PLAN [name] is a 75 y.o. male with history of   CKD 3, OSA, HTN, Kidney stones, OSA on CPAP who presented to the ED with malaise, lightheaded, confusion, dysuria, nausea and vomiting.  He is being treated for a Complicated UTI due to right ureteral stent obstruction. 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 This was not noted on CTH 2 days prior at the time of admission.   He was transferred to [facility] for further workup and stroke evaluation.   Denies any arm or leg weakness or numbness, no facial droop, no facial numbness, no speech issues, no slurred speech. Endorses difficulty with vision on the left, this started a few days ago.   MRI shows a right PCA territory stroke.   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 No significant mass effect at this time. No evidence of hemorrhagic conversion. 2. Chronic lacunar infarcts within the  right basal ganglia. 3. Background moderate chronic small vessel ischemic changes within the cerebral white matter. 4. Mild generalized parenchymal atrophy.  CTA head   1. Occlusion of a P3 segment right posterior cerebral artery branch. 2. Severe stenosis of a right posterior cerebral artery branch at the P2/P3 junction. 3. Severe stenosis within the P2 segment of the left posterior cerebral artery. 4. Reconstitution of enhancement within the intracranial right vertebral artery, likely due to retrograde flow. 5. Mild/moderate stenosis within the V4 left vertebral artery. 6. 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 7. Moderate stenosis of the right anterior cerebral artery A1 segment. 8. Atherosclerotic plaque within the intracranial internal carotid arteries bilaterally with no more than mild stenosis.   CTA NECK 1. The non-dominant right vertebral artery becomes occluded at the C1-skull base level. 2. The dominant left vertebral artery is patent throughout the neck without stenosis. 3. Common and internal carotid arteries patent within the neck bilaterally without stenosis. 4. 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  MRI   Multifocal acute ischemia within both cerebral hemispheres, right worse than left. Ischemia in the left middle cerebellar peduncle and inferior left cerebellum may be slightly older but is still in the recent subacute phase. No hemorrhage or mass effect.   LDL NOT CALCULATED HgbA1c 6.1 VTE prophylaxis - SCD    Diet   Diet NPO time specified   No antithrombotic prior to admission, now on aspirin 81 mg daily and clopidogrel 75 mg daily.  For 3 months followed by aspirin alone Therapy recommendations:  CIR Disposition:  pending  Hypertension Home meds:  amlodipine Stable Permissive hypertension (OK if < 220/120) but gradually normalize in 5-7 days Long-term BP goal  normotensive  Hyperlipidemia Home meds:  no  LDL NOT CALCULATED, goal < 70 Results for Nicholas, Mckenzie (MRN [id number]) as of 04/05/2021 12:03  Ref. Range 04/05/2021 02:00  Total CHOL/HDL Ratio Latest Units: RATIO NOT CALCULATED  Cholesterol Latest Ref Range: 0 - 200 mg/dL 82  HDL Cholesterol Latest Ref Range: >40 mg/dL <10 (L)  LDL (calc) Latest Ref Range: 0 - 99 mg/dL NOT CALCULATED  Triglycerides Latest Ref Range: <150 mg/dL 91  VLDL Latest Ref Range: 0 - 40 mg/dL 18   HgbA1c 6.1, goal < 7.0 CBGs No results for input(s): GLUCAP in the last 72 hours.  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 CKD IIIa  - SCr is 1.63 on admission, up from last month in setting of hypovolemia  - Continue IVF hydration, renally-dose medications, monitor     4. OSA  - Continue CPAP qHS     5. Hypertension  - BP well controlled at home off of antihypertensives for the past couple weeks  - Treat as-needed only for now     6. Depression  - Hold Prozac initially in light of somnolence and diarrhea    7. N/V/D  - Abd exam benign and no bowel wall thickening or inflammatory change on CT  - Denies N/V/D since arrival in ED 8 hrs ago  - Continue IVF hydration, monitor electrolytes     8. Elevated transaminases  - Transaminases slightly  elevated with normal alk phos and bilirubin  - No acute hepatobiliary abnormality on CT in ED  - Hold statin initially and trend        Hospital day # 5   He presented with confusion, headache and vertigo and MRI shows right PCA, left cerebellar and left periventricular infarcts with CT angiogram showing diffuse multivessel intracranial atherosclerosis.  Patient had percutaneous procedure for treatment for hydronephrosis and drainage of pus.  Recommend dual antiplatelet therapy aspirin Plavix for 3 months followed by aspirin alone when it is safe to be restarted after the procedure.  And aggressive risk factor modification.  Patient advised not to drive till his vision improves.  Continue ongoing therapies.  His insurance is not in network with [facility] so he will need inpatient rehab at an outside facility.   Discussed with patient and family at the bedside and answered questions..  Discussed with Dr. Nevada Crane.  Stroke team will sign off.  Kindly call for questions.  Greater than 50% time during t this 25-minute visit was spent on counseling and coordination of care and discussion with care team and answering questions.  Antony Contras, MD Medical Director [facility] Pager: [phone] 04/07/2021 1:35 PM   To contact Stroke Continuity provider, please refer to [email or web address] After hours, contact General Neurology

## 2021-04-07 NOTE — Progress Notes (Signed)
PT Cancellation Note  Patient Details Name: Chett Taniguchi MRN: [id number] DOB: [birth year]   Cancelled Treatment:    Reason Eval/Treat Not Completed: Patient at procedure or test/unavailable   Angelina Ok [facility] 04/07/2021, 10:08 AM Skip Mayer PT Acute Rehabilitation Services Pager [phone] Office [phone]

## 2021-04-08 DIAGNOSIS — I10 Essential (primary) hypertension: Secondary | ICD-10-CM | POA: Diagnosis not present

## 2021-04-08 LAB — CULTURE, BLOOD (ROUTINE X 2)
Culture: NO GROWTH
Culture: NO GROWTH
Special Requests: ADEQUATE
Special Requests: ADEQUATE

## 2021-04-08 LAB — BASIC METABOLIC PANEL
Anion gap: 9 (ref 5–15)
BUN: 30 mg/dL — ABNORMAL HIGH (ref 8–23)
CO2: 22 mmol/L (ref 22–32)
Calcium: 8.5 mg/dL — ABNORMAL LOW (ref 8.9–10.3)
Chloride: 108 mmol/L (ref 98–111)
Creatinine, Ser: 1.46 mg/dL — ABNORMAL HIGH (ref 0.61–1.24)
GFR, Estimated: 50 mL/min — ABNORMAL LOW (ref >60)
Glucose, Bld: 113 mg/dL — ABNORMAL HIGH (ref 70–99)
Potassium: 4.2 mmol/L (ref 3.5–5.1)
Sodium: 139 mmol/L (ref 135–145)

## 2021-04-08 LAB — CBC
HCT: 34.3 % — ABNORMAL LOW (ref 39.0–52.0)
Hemoglobin: 11 g/dL — ABNORMAL LOW (ref 13.0–17.0)
MCH: 28.4 pg (ref 26.0–34.0)
MCHC: 32.1 g/dL (ref 30.0–36.0)
MCV: 88.4 fL (ref 80.0–100.0)
Platelets: 325 10*3/uL (ref 150–400)
RBC: 3.88 MIL/uL — ABNORMAL LOW (ref 4.22–5.81)
RDW: 15.8 % — ABNORMAL HIGH (ref 11.5–15.5)
WBC: 17.2 10*3/uL — ABNORMAL HIGH (ref 4.0–10.5)
nRBC: 0 % (ref 0.0–0.2)

## 2021-04-08 MED ORDER — PIPERACILLIN-TAZOBACTAM 3.375 G IVPB 30 MIN
3.3750 g | Freq: Once | INTRAVENOUS | Status: AC
  Administered 2021-04-08: 3.375 g via INTRAVENOUS
  Filled 2021-04-08 (×2): qty 50

## 2021-04-08 MED ORDER — TRAZODONE HCL 50 MG PO TABS: 50.0000 mg | ORAL_TABLET | Freq: Every evening | ORAL | Status: DC | PRN

## 2021-04-08 MED ORDER — PIPERACILLIN-TAZOBACTAM 3.375 G IVPB
3.3750 g | Freq: Three times a day (TID) | INTRAVENOUS | Status: DC
  Administered 2021-04-08 – 2021-04-14 (×18): 3.375 g via INTRAVENOUS
  Filled 2021-04-08 (×18): qty 50

## 2021-04-08 MED ORDER — MELATONIN 3 MG PO TABS
3.0000 mg | ORAL_TABLET | Freq: Every day | ORAL | Status: DC
  Administered 2021-04-08 – 2021-04-13 (×6): 3 mg via ORAL
  Filled 2021-04-08 (×6): qty 1

## 2021-04-08 NOTE — Progress Notes (Signed)
Pharmacy Antibiotic Note  Nicholas Mckenzie is a 75 y.o. male admitted on 04/01/2021, now with concern for  intra-abdominal infection .  Pharmacy has been consulted for Zosyn dosing.  Plan: Zosyn 3.375g IV q8h (4 hour infusion).  Height: 5\' 8"  (172.7 cm) Weight: 94.4 kg (208 lb 3.2 oz) IBW/kg (Calculated) : 68.4  Temp (24hrs), Avg:99.4 F (37.4 C), Min:97.7 F (36.5 C), Max:100.4 F (38 C)  Recent Labs  Lab 04/01/21 2255 04/02/21 0200 04/02/21 0559 04/03/21 0600 04/04/21 0311 04/05/21 0200 04/06/21 0443  WBC 12.0*  --  8.7 15.7* 17.3* 15.0* 14.6*  CREATININE 1.63*  --  1.59* 1.51* 1.67* 1.60* 1.44*  LATICACIDVEN 1.3 1.0  --   --   --   --   --     Estimated Creatinine Clearance: 50.9 mL/min (A) (by C-G formula based on SCr of 1.44 mg/dL (H)).    Allergies  Allergen Reactions   Statins Other (See Comments)    Pt reports severe weakness and mobility issues with his med, as well as diarrhea    Thank you for allowing pharmacy to be a part of this patient's care.  04/08/21, PharmD, BCPS  04/08/2021 5:49 AM

## 2021-04-08 NOTE — Progress Notes (Signed)
Patient was drowsy this morning comparatively speaking to yesterdays progress.  During assessment patient stated that he took a supplement and pills in the morning that his wife gave him when staff was NOT present before she left.  Patient stated his wife said they were a "silver" supplement drink and "vitamin" pills  Notified Charge nurse, and DO.  Will reach out to pharmacy for a medication reconciliation order  Educated patient about NOT taking anything that the hospital does not have for him while inpatient. Patient stated he understood and would comply. Patient states he feels safe at home and doesn't feel in danger  Will educate wife upon arrival to unit later today.  Patient's Temperature has ranged from 99.8-100.0  Will continue to monitor

## 2021-04-08 NOTE — Progress Notes (Signed)
Inpatient Rehab Admissions Coordinator:   CIR consult received. While Pt. Does appear appropriate for acute inpatient rehab, Pt.'s insurance is out of network for CIR at [facility]. TOC will need to look into other facilities that are in network with pt.'s insurance. MD and TOC notified. CIR will sign off.   Megan Salon, MS, CCC-SLP Rehab Admissions Coordinator  [phone] (celll) [phone] (office)

## 2021-04-08 NOTE — Progress Notes (Signed)
Upon wife's arrival to unit I asked the wife if the patient took anything to sleep. She stated she has a spray that she brought to give the patient to sleep that she gave last night that "knocks him out".  Pt wife educated on not giving patient any supplements, vitamins, medications, drinks or sprays from home while inpatient at [facility].  When asked the specifics of the spray after being educated on not giving anything to the patient the wife stated that is was just distilled water.  Wife stated she understood and would comply  Pharmacist entered the room shortly after.  Wife stated she brought a home medications list for her husband before the pharmacist arrived.  Once the pharmacist arrived wife said she did not have a medications list for her husband but would bring the list and all his home medications tomorrow for the pharmacist to review.

## 2021-04-08 NOTE — Progress Notes (Signed)
Patient states he takes medicine to sleep that isn't in his chart but isn't sure what he takes at home to sleep.  Patient also states he hasn't taken any medication at home for 1 month due to having constant diarrhea and vomit.  Patient states his wife keeps track of his medication at home.  Patient states he was hospitalized in September for overdose on his Benzodiazapine medications. Checked and Verified with Pharmacist that this did occur.  Contacted Pharmacy about patient's home meds.  Pharmacy has questions about patients home meds as well and will meet me in the patients room.  Pt is alert and temperature has gone down and is stable now after a few hours of monitoring.

## 2021-04-08 NOTE — Progress Notes (Signed)
Pt placed on CPAP at previous settings. Tol well 

## 2021-04-08 NOTE — Progress Notes (Addendum)
Urology Progress Note   75 yo M w/ Hx of retained R ureteral stent c/b obstruction and 3 cm bladder stone. Developed febrile UTI and acute CVA while admitted.  Subjective: NAEON.  AF, VSS Now s/p R PCN placement on 10/14.  Adequate UOP from Foley and R PCN. R PCN urine sent for culture Hgb and Cr stable Prior Ucx w/ Enterobacter spp. Currently on Zosyn.    Objective: Vital signs in last 24 hours: Temp:  [97.7 F (36.5 C)-100.4 F (38 C)] 98.8 F (37.1 C) (10/15 0734) Pulse Rate:  [72-85] 85 (10/15 0734) Resp:  [14-20] 18 (10/15 0734) BP: (122-143)/(67-86) 136/81 (10/15 0734) SpO2:  [91 %-99 %] 96 % (10/15 0734)  Intake/Output from previous day: 10/14 0701 - 10/15 0700 In: 5 [I.V.:5] Out: 4800 [Urine:1800] Intake/Output this shift: Total I/O In: -  Out: 450 [Urine:450]  Physical Exam:  General: Alert and oriented CV: Regular rate Lungs: No increased work of breathing Abdomen: Soft, non-tender. GU: Foley in place draining clear yellow urine. Right PCN in place draining bloody urine w/ some small clots/purulent draining.  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 As such, request made for placement of a right-sided percutaneous nephrostomy catheter for infection source control purposes. EXAM: ULTRASOUND AND FLUOROSCOPIC GUIDED PLACEMENT OF LEFT  NEPHROSTOMY TUBE COMPARISON:  None. MEDICATIONS: Ciprofloxacin 400 mg IV; The antibiotic was administered in an appropriate time frame prior to skin puncture. ANESTHESIA/SEDATION: Moderate (conscious) sedation was employed during this procedure as administered by the Interventional Radiology RN. A total of Versed 1.5 mg and Fentanyl 75 mcg was administered intravenously. Moderate Sedation Time: 32 minutes. The patient's level of consciousness and vital signs were monitored continuously by radiology nursing throughout the procedure under my direct supervision. CONTRAST:  10 cc mL Isovue 300 - administered into the renal collecting system FLUOROSCOPY TIME:  18 seconds (8 mGy) COMPLICATIONS: None immediate. PROCEDURE: The procedure, risks, benefits, and alternatives were explained to the patient, questions were encouraged and answered and informed consent was obtained. A timeout was performed prior to the initiation of the procedure. The operative site was prepped and draped in the usual sterile fashion and a sterile drape was applied covering the operative field. A sterile gown and sterile gloves were used for the procedure. Local anesthesia was provided with 1% Lidocaine with epinephrine. Ultrasound was used to localize the right kidney. Under direct ultrasound guidance, a 20 gauge needle was advanced into the renal collecting system. An ultrasound image documentation was performed. Access within the collecting system was confirmed with the efflux of purulent urine followed by limited contrast injection. Under intermittent fluoroscopic guidance, an 0.018 wire was advanced into the collecting system and the tract was dilated with an Accustick stent. Next, over a short Amplatz wire, the track was further dilated ultimately allowing placement of a 14-French percutaneous nephrostomy catheter  with end coiled and locked within the renal pelvis. Following nephrostomy catheter placement, 3 L of purulent urine was aspirated.  A small amount of aspirated purulent urine was capped and sent to the laboratory for analysis. A postprocedural spot fluoroscopic images were obtained in various obliquities. The catheter was secured at the skin entrance site with an interrupted suture and a stat lock device and connected to a gravity bag. Dressings were applied. The patient tolerated procedure well without immediate postprocedural complication. FINDINGS: Ultrasound scanning demonstrates a severe dilatation of the right kidney without any identifiable renal parenchyma. Under a combination of ultrasound fluoroscopic guidance, the right renal pelvis was accessed allowing placement of a 14 French percutaneous nephrostomy catheter. IMPRESSION: Successful ultrasound and fluoroscopic guided placement of a right sided 80 French PCN yielding 3 L of purulent urine. A small amount of aspirated purulent urine was capped and sent to the laboratory for analysis. 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 Routine nephrostomy catheter exchange may be performed in 6-8 weeks unless nephroureteral intervention is pursued in the interval. Electronically Signed   By: Simonne Come M.D.   On: 04/07/2021 15:50    Assessment/Plan:  75 y.o. male w/ Hx of retained R ureteral stent c/b stent failure/obstruction and 3 cm bladder stone. Originally planned for R nephroureterectomy and removal of bladder stone; however, canceled/delayed due to acute CVA. Currently on Plavix x 3 months and will begin inpatient rehab at an outside facility when able. Developed febrile UTI while admitted and now s/p R PCN placement.   - Cont Foley catheter and R PCN to drainage - Continue UTI treatment. Follow up Ucx from R PCN - Will re-evaluate plan for operative intervention early next week. Will likely delay given need for blood thinners and to recover  from acute stroke.    LOS: 6 days   Reola Mosher, MD [facility] Urology Resident, PGY4 [facility]  I have seen and examined the patient and agree with the above assessment and plan.

## 2021-04-08 NOTE — Progress Notes (Signed)
The med rec tech spoke with patient's spouse who said that he hasn't been taking his medications for the past two weeks.  Based on his fill history, he has been taking amlodipine 10 mg daily, Prozac 40 mg daily, hydralazine 25 mg q8h, Toprol 25 mg daily, Quetiapine 50 mg daily, Crestor 10 mg daily, valsartan 160 mg daily, temazepam 30 mg qHS prn sleep, verified by pharmacy.

## 2021-04-08 NOTE — Social Work (Signed)
CSW attempted to speak with pt about CIR unable to accept however someone was working with pt.  CSW will follow up tomorrow.

## 2021-04-08 NOTE — Progress Notes (Addendum)
PROGRESS NOTE  Nicholas Mckenzie IHK:[id number] DOB: 17-Jan-1946 DOA: 04/01/2021 PCP: Eartha Inch, MD  HPI/Recap of past 24 hours:  75 y.o. male with medical history significant for hypertension, nephrolithiasis, OSA on CPAP, CKD 3A, and depression, who presented to emergency department for evaluation of fatigue, lightheadedness, dysuria, confusion, nausea, vomiting, and loose stools. Previously discharged 3 weeks ago after being admitted for polypharmacy/accidental overdose.  Work-up in the ED revealed complicated UTI in the setting of R ureteral stent obstruction.  Seen by urology.  Post percutaneous nephrostomy by IR on 04/07/2021 with 3 L of purulent fluid aspirated following PCN placement, sample sent to lab for analysis.  PCN connected to gravity bag.    Hospital course complicated by confusion, central vertigo, on 04/04/2021, work-up revealed acute/subacute CVA involving the right PCA territory cortical/subcortical infarct within the right parietal occipital lobes, chronic lacunar infarcts within the right basal ganglia for which neurology/stroke team was consulted.  He had CT angio head and neck done on 04/04/2021 and MRI brain completed on 04/05/2021.  Rocephin switched to Zosyn on 04/08/2021 due to worsening leukocytosis.  04/08/2021: Seen at his bedside.  Family present.  No new complaints.    Assessment/Plan: Principal Problem:   Acute encephalopathy Active Problems:   CKD (chronic kidney disease) stage 3a   Hypertension   Sleep apnea   Major depressive disorder, recurrent episode, in partial remission (HCC)   Ureteral stent occlusion, sequela   Elevated transaminase level   Goals of care, counseling/discussion  Acute/subacute CVA involving right PCA territory, both cerebral hemisphere, right worse than left, confirmed on MRI brain 04/05/2021. Cortical/subcortical infarct within the right parietal occipital lobes Ischemia in the left middle cerebellar peduncle and inferior left  cerebellum - Confirmed on MRI brain. --Neurology followed, signed off. --Continue aspirin 81 Plavix 75 for 3 months followed by aspirin alone -recommending against any breaks in therapy unless for urgent or emergent issues procedures or surgery Hemoglobin A1c 6.1, goal less than 7.0. LDL, not calculated. --PT/OT/SLP -recommended CIR however his insurance Is not in network for our inpatient rehab. TOC assisting with inpatient rehab placement at other institutions   Complicated Enterobacter cloacae/Aerococcus species UTI in the setting of right ureteral stent obstruction.   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 3 L of purulent fluid removed following PCN placement. Follow fluid analysis.  Growing rare gram-positive cocci, too young to read. Rocephin switched to Zosyn on 04/08/2021 due to worsening leukocytosis.  Ascending aortic aneurysm 4.5 cm seen on 2D echo Recommendation for follow-up CTA Denies any chest pain. Keep BP normotensive.  Resolved acute metabolic encephalopathy -Likely multifactorial in the setting of infection, complicated by acute stroke  -Infection likely primary driving source given worsening confusion up to 10 days prior to admission, well before strokelike symptoms   Nausea vomiting diarrhea, ongoing but improving Continue supportive care.  Resolved transaminitis -Likely in setting of poor p.o. intake -LFTs have normalized.   CKD stage IIIa -Off IV fluid.  Good oral intake today. Continue to avoid nephrotoxic agents, dehydration, and hypotension.  Obstructive sleep apnea -continue CPAP nightly  Essential hypertension  BP is currently at goal. Continue Norvasc 5 mg daily Continue to monitor vital signs  Obesity: Body mass index is 31.66 kg/m.  Complicates overall care and prognosis.  Recommend lifestyle modifications including physical activity and diet  for weight loss and overall long-term health.     DVT prophylaxis: Subcu Lovenox daily. Code  Status: Full Family Communication: Family at bedside.   Status is: Inpatient   Dispo: The patient is from: Home              Anticipated d/c is to: CIR              Anticipated d/c date is: > 72 hours              Patient currently not medically stable for discharge   Consultants:  Urology, neurology   Antimicrobials:  Ceftriaxone, stopped on 04/08/2021 Zosyn started on 04/08/2021.           Objective: Vitals:   04/07/21 2320 04/08/21 0336 04/08/21 0734 04/08/21 1030  BP: 132/71 125/70 136/81 130/71  Pulse: 82 79 85 87  Resp: Temp: (!) 100.4 F (38 C) 99.4 F (37.4 C) 98.8 F (37.1 C) 99.9 F (37.7 C)  TempSrc: Axillary Axillary Oral Oral  SpO2: 91% 95% 96% 97%  Weight:      Height:        Intake/Output Summary (Last 24 hours) at 04/08/2021 1423 Last data filed at 04/08/2021 1300 Gross per 24 hour  Intake 15.18 ml  Output 2490 ml  Net -2474.82 ml   Filed Weights   04/02/21 1500 04/03/21 1957 04/03/21 2000  Weight: (P) 94.1 kg 95.7 kg 94.4 kg    Exam:  General: 75 y.o. year-old male well-developed well-nourished in no acute distress.  He is alert and interactive. Cardiovascular: Regular rate and rhythm no rubs or gallops.   Respiratory: Clear to auscultation with no wheezes or rales.  Abdomen: Soft nontender normal bowel sounds present.  Musculoskeletal: No lower extremity edema bilaterally.   Skin: No ulcerative lesions noted.   Psychiatry: Mood is appropriate for condition and setting.  Data Reviewed: CBC: Recent Labs  Lab 04/01/21 2255 04/02/21 0559 04/03/21 0600 04/04/21 0311 04/05/21 0200 04/06/21 0443 04/08/21 0503  WBC 12.0*   < > 15.7* 17.3* 15.0* 14.6* 17.2*  NEUTROABS 10.6*  --   --   --   --   --   --   HGB 11.8*   < > 11.5* 10.9* 10.8* 11.0* 11.0*  HCT 37.0*   < > 34.6* 32.8* 33.0* 33.2* 34.3*  MCV 89.8   < > 85.9 85.0  85.9 86.0 88.4  PLT 340   < > 360 352 323 305 325   < > = values in this interval not displayed.   Basic Metabolic Panel: Recent Labs  Lab 04/03/21 0600 04/04/21 0311 04/05/21 0200 04/06/21 0443 04/08/21 0503  NA 140 140 140 139 139  K 3.7 3.4* 3.4* 3.7 4.2  CL 108 107 108 107 108  CO2 23 23 21* 22 22  GLUCOSE 126* 131* 124* 129* 113*  BUN 31* 29* 24* 24* 30*  CREATININE 1.51* 1.67* 1.60* 1.44* 1.46*  CALCIUM 8.7* 8.3* 8.5* 8.5* 8.5*  MG  --  1.9  --   --   --    GFR: Estimated Creatinine Clearance: 50.2 mL/min (A) (by C-G formula based on SCr of 1.46 mg/dL (H)). Liver Function Tests: Recent Labs  Lab 04/01/21 2255 04/02/21 0559 04/03/21 0600 04/04/21 0311  AST 42* 32 32 26  ALT 46* 41 42 35  ALKPHOS 75 74 70 71  BILITOT 0.8 0.6 0.4 0.7  PROT 6.2* 6.2* 5.7* 5.8*  ALBUMIN 2.2* 2.3* 1.9* 2.1*   Recent Labs  Lab 04/01/21 2255  LIPASE 40   No results for input(s): AMMONIA  in the last 168 hours. Coagulation Profile: Recent Labs  Lab 04/06/21 1506  INR 1.2   Cardiac Enzymes: No results for input(s): CKTOTAL, CKMB, CKMBINDEX, TROPONINI in the last 168 hours. BNP (last 3 results) No results for input(s): PROBNP in the last 8760 hours. HbA1C: No results for input(s): HGBA1C in the last 72 hours.  CBG: No results for input(s): GLUCAP in the last 168 hours. Lipid Profile: No results for input(s): CHOL, HDL, LDLCALC, TRIG, CHOLHDL, LDLDIRECT in the last 72 hours.  Thyroid Function Tests: No results for input(s): TSH, T4TOTAL, FREET4, T3FREE, THYROIDAB in the last 72 hours. Anemia Panel: No results for input(s): VITAMINB12, FOLATE, FERRITIN, TIBC, IRON, RETICCTPCT in the last 72 hours. Urine analysis:    Component Value Date/Time   COLORURINE YELLOW 04/02/2021 0230   APPEARANCEUR HAZY (A) 04/02/2021 0230   LABSPEC 1.016 04/02/2021 0230   PHURINE 5.0 04/02/2021 0230   GLUCOSEU NEGATIVE 04/02/2021 0230   HGBUR NEGATIVE 04/02/2021 0230   BILIRUBINUR NEGATIVE  04/02/2021 0230   KETONESUR 5 (A) 04/02/2021 0230   PROTEINUR 30 (A) 04/02/2021 0230   NITRITE NEGATIVE 04/02/2021 0230   LEUKOCYTESUR LARGE (A) 04/02/2021 0230   Sepsis Labs: @LABRCNTIP (procalcitonin:4,lacticidven:4)  ) Recent Results (from the past 240 hour(s))  Resp Panel by RT-PCR (Flu A&B, Covid) Nasopharyngeal Swab     Status: None   Collection Time: 04/01/21  8:00 PM   Specimen: Nasopharyngeal Swab; Nasopharyngeal(NP) swabs in vial transport medium  Result Value Ref Range Status   SARS Coronavirus 2 by RT PCR NEGATIVE NEGATIVE Final    Comment: (NOTE) SARS-CoV-2 target nucleic acids are NOT DETECTED.  The SARS-CoV-2 RNA is generally detectable in upper respiratory specimens during the acute phase of infection. The lowest concentration of SARS-CoV-2 viral copies this assay can detect is 138 copies/mL. A negative result does not preclude SARS-Cov-2 infection and should not be used as the sole basis for treatment or other patient management decisions. A negative result may occur with  improper specimen collection/handling, submission of specimen other than nasopharyngeal swab, presence of viral mutation(s) within the areas targeted by this assay, and inadequate number of viral copies(<138 copies/mL). A negative result must be combined with clinical observations, patient history, and epidemiological information. The expected result is Negative.  Fact Sheet for Patients:  06/01/21  Fact Sheet for Healthcare Providers:  BloggerCourse.com  This test is no t yet approved or cleared by the [employer] FDA and  has been authorized for detection and/or diagnosis of SARS-CoV-2 by FDA under an Emergency Use Authorization (EUA). This EUA will remain  in effect (meaning this test can be used) for the duration of the COVID-19 declaration under Section 564(b)(1) of the Act, 21 U.S.C.section 360bbb-3(b)(1), unless the  authorization is terminated  or revoked sooner.       Influenza A by PCR NEGATIVE NEGATIVE Final   Influenza B by PCR NEGATIVE NEGATIVE Final    Comment: (NOTE) The Xpert Xpress SARS-CoV-2/FLU/RSV plus assay is intended as an aid in the diagnosis of influenza from Nasopharyngeal swab specimens and should not be used as a sole basis for treatment. Nasal washings and aspirates are unacceptable for Xpert Xpress SARS-CoV-2/FLU/RSV testing.  Fact Sheet for Patients: [place]  Fact Sheet for Healthcare Providers: BloggerCourse.com  This test is not yet approved or cleared by the [employer] FDA and has been authorized for detection and/or diagnosis of SARS-CoV-2 by FDA under an Emergency Use Authorization (EUA). This EUA will remain in effect (meaning this test can  be used) for the duration of the COVID-19 declaration under Section 564(b)(1) of the Act, 21 U.S.C. section 360bbb-3(b)(1), unless the authorization is terminated or revoked.  Performed at [facility], [address], [place], [place] [address]   Urine Culture     Status: Abnormal   Collection Time: 04/02/21  2:41 AM   Specimen: Urine, Clean Catch  Result Value Ref Range Status   Specimen Description   Final    URINE, CLEAN CATCH Performed at [facility], [address], [place], [place] [address]    Special Requests   Final    NONE Performed at [facility], [address], [place], [place] [address]    Culture (A)  Final    >=100,000 COLONIES/mL ENTEROBACTER CLOACAE 50,000 COLONIES/mL AEROCOCCUS SPECIES Standardized susceptibility testing for this organism is not available. Performed at [facility], [address], [place], [place] [address]    Report Status 04/05/2021 FINAL  Final   Organism ID, Bacteria ENTEROBACTER CLOACAE (A)  Final      Susceptibility   Enterobacter cloacae - MIC*     CEFAZOLIN >=64 RESISTANT Resistant     CEFEPIME <=0.12 SENSITIVE Sensitive     CIPROFLOXACIN <=0.25 SENSITIVE Sensitive     GENTAMICIN <=1 SENSITIVE Sensitive     IMIPENEM <=0.25 SENSITIVE Sensitive     NITROFURANTOIN 64 INTERMEDIATE Intermediate     TRIMETH/SULFA >=320 RESISTANT Resistant     PIP/TAZO 8 SENSITIVE Sensitive     * >=100,000 COLONIES/mL ENTEROBACTER CLOACAE  Culture, blood (routine x 2)     Status: None   Collection Time: 04/02/21 11:24 PM   Specimen: BLOOD  Result Value Ref Range Status   Specimen Description   Final    BLOOD BLOOD RIGHT HAND Performed at [facility], [address] [place], [place] [address]    Special Requests   Final    BOTTLES DRAWN AEROBIC AND ANAEROBIC Blood Culture adequate volume Performed at [facility], [address] [place], [place] [address]    Culture   Final    NO GROWTH 5 DAYS Performed at [facility], [address] [place], [place] [address]    Report Status 04/08/2021 FINAL  Final  Culture, blood (routine x 2)     Status: None   Collection Time: 04/02/21 11:26 PM   Specimen: BLOOD  Result Value Ref Range Status   Specimen Description   Final    BLOOD RIGHT ANTECUBITAL Performed at [facility], [address] [place], [place] [address]    Special Requests   Final    BOTTLES DRAWN AEROBIC AND ANAEROBIC Blood Culture adequate volume Performed at [facility], [address], [place], [place] [address]    Culture   Final    NO GROWTH 5 DAYS Performed at [facility], [address] [place], [place] [address]    Report Status 04/08/2021 FINAL  Final  Aerobic/Anaerobic Culture w Gram Stain (surgical/deep wound)     Status: None (Preliminary result)   Collection Time: 04/07/21 12:56 PM   Specimen: Abscess  Result Value Ref Range Status   Specimen Description ABSCESS  Final   Special Requests POST RIGHT SIDED PCN PLACEMENT   Final   Gram Stain   Final    ABUNDANT WBC PRESENT, PREDOMINANTLY PMN RARE GRAM POSITIVE COCCI    Culture   Final    TOO YOUNG TO READ Performed  at [facility], [address], [place], [place] [address]    Report Status PENDING  Incomplete      Studies: No results found.  Scheduled Meds:  amLODipine  5 mg Oral Daily   aspirin  81 mg Oral Daily   Or   aspirin  300 mg Rectal Daily   clopidogrel  75 mg Oral Daily   enoxaparin (LOVENOX) injection  40 mg Subcutaneous Q24H   lactobacillus  1 g Oral BID   sodium chloride flush  5 mL Intracatheter Q8H    Continuous Infusions:  piperacillin-tazobactam (ZOSYN)  IV 3.375 g (04/08/21 1210)   promethazine (PHENERGAN) injection (IM or IVPB)       LOS: 6 days     Darlin Drop, MD [facility] Pager [phone]  If 7PM-7AM, please contact night-coverage [email or web address] Password [email or web address] 04/08/2021, 2:23 PM

## 2021-04-08 NOTE — Progress Notes (Signed)
Patient ID: Nicholas Mckenzie, male   DOB: 06/10/1946, 75 y.o.   MRN: [id number]    Referring Physician(s): Lancaster,W/French,W  Supervising Physician: Ruel Favors  Patient Status:  [facility] - In-pt  Chief Complaint: Dysuria, confusion, recent stroke, retained right ureteral stent with obstruction   Subjective: Patient currently awake, talkative, family in room; has some mild right flank discomfort, denies nausea or vomiting   Allergies: Statins  Medications: Prior to Admission medications   Medication Sig Start Date End Date Taking? Authorizing Provider  acetaminophen (TYLENOL) 325 MG tablet Take 2 tablets (650 mg total) by mouth every 6 (six) hours as needed for mild pain (or Fever >/= 101). Patient not taking: No sig reported 03/12/21   Rodolph Bong, MD  amLODipine (NORVASC) 10 MG tablet Take 1 tablet (10 mg total) by mouth daily. Patient not taking: No sig reported 03/12/21   Rodolph Bong, MD  amoxicillin-clavulanate (AUGMENTIN) 875-125 MG tablet Take 1 tablet by mouth every 12 (twelve) hours. Patient not taking: No sig reported 03/12/21   Rodolph Bong, MD  hydrALAZINE (APRESOLINE) 25 MG tablet Take 1 tablet (25 mg total) by mouth every 8 (eight) hours. Patient not taking: No sig reported 03/12/21   Rodolph Bong, MD     Vital Signs: BP 130/71 (BP Location: Right Arm)   Pulse 87   Temp 99.9 F (37.7 C) (Oral)   Resp 20   Ht 5\' 8"  (1.727 m)   Wt 208 lb 3.2 oz (94.4 kg)   SpO2 97%   BMI 31.66 kg/m   Physical Exam awake, answering simple questions okay, following commands, right PCN intact, insertion site okay, mildly tender to palpation, output 800 cc yesterday, 100 cc today of blood-tinged urine, drain is being irrigated.  Imaging: CT ANGIO HEAD W OR WO CONTRAST  Addendum Date: 04/04/2021   ADDENDUM REPORT: 04/04/2021 15:55 ADDENDUM: CT head impression 1, CTA neck impression 1 and CTA head impressions 1, 2 and 6 called by telephone at the time of  interpretation on 04/04/2021 at 3:50 pm to provider Dr. 06/04/2021, who verbally acknowledged these results. Electronically Signed   By: Denton Lank D.O.   On: 04/04/2021 15:55   Result Date: 04/04/2021 CLINICAL DATA:  Vertigo, central. Additional history provided: Sinus headache, vertigo, confusion. EXAM: CT ANGIOGRAPHY HEAD AND NECK TECHNIQUE: Multidetector CT imaging of the head and neck was performed using the standard protocol during bolus administration of intravenous contrast. Multiplanar CT image reconstructions and MIPs were obtained to evaluate the vascular anatomy. Carotid stenosis measurements (when applicable) are obtained utilizing NASCET criteria, using the distal internal carotid diameter as the denominator. CONTRAST:  19mL OMNIPAQUE IOHEXOL 350 MG/ML SOLN COMPARISON:  Head CT 04/02/2021. FINDINGS: CT HEAD FINDINGS Brain: Mild generalized cerebral atrophy. Acute/subacute appearing cortical/subcortical infarct within the right parietooccipital lobes and callosal splenium (right PCA vascular territory). No significant mass effect at this time. No evidence of hemorrhagic conversion. 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 Background moderate patchy and ill-defined hypoattenuation within the cerebral white matter, nonspecific but compatible with chronic small vessel ischemic disease. There is no acute intracranial hemorrhage. No extra-axial fluid collection. No evidence of an intracranial mass. No midline shift. Vascular: Reported below. Skull: Normal. Negative for fracture or focal lesion. Sinuses: Postsurgical appearance of the paranasal sinuses. Small mucous retention cyst versus polypoid mucosal thickening within the left maxillary sinus. Orbits: No acute or significant orbital finding. Review of the MIP images confirms the above findings CTA NECK FINDINGS Aortic arch:  Standard aortic branching. Atherosclerotic plaque within the proximal major branch vessels of the neck. No  hemodynamically significant innominate or proximal subclavian artery stenosis. Right carotid system: CCA and ICA patent within the neck without stenosis. Minimal soft and calcified plaque within the CCA. Left carotid system: CCA and ICA patent within the neck without stenosis. Minimal atherosclerotic plaque about the carotid bifurcation. Vertebral arteries: The non dominant right vertebral artery becomes occluded at the C1-skull base level. The dominant left vertebral artery is patent within the neck without stenosis. Skeleton: Partially imaged thoracic dextrocurvature. Multilevel bridging ventrolateral osteophytes with relative preservation of the intervertebral disc spaces. 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 No acute bony abnormality or aggressive osseous lesion. Other neck: 1.4 cm right thyroid lobe nodule, not meeting consensus criteria for ultrasound follow-up based on size. Upper chest: No consolidation within the imaged lung apices. Review of the MIP images confirms the above findings CTA HEAD FINDINGS Anterior circulation: The intracranial internal carotid arteries are patent. Calcified plaque within both vessels with no more than mild stenosis. There is a severe, near occlusive stenosis of the proximal to mid M1 left middle cerebral artery (series 14, image 57). The M1 right middle cerebral artery is patent with mild atherosclerotic irregularity. No M2 proximal branch occlusion or high-grade proximal stenosis is identified. The anterior cerebral arteries are patent. Moderate stenosis within the proximal right A1 segment. Posterior circulation: There is reconstitution of enhancement within the V4 right vertebral artery, likely due to retrograde flow. Enhancement is present within the proximal right PICA. Mild-to-moderate atherosclerotic narrowing of the V4 left vertebral artery. The basilar artery is patent. Severe stenosis of a right PCA branch at the P2/P3 junction.  Occlusion of a right PCA branch at the P3 segment. The left cerebral artery is fetal in origin and patent. However, there is a severe stenosis within the P2 left PCA. The right posterior communicating artery is diminutive or absent. Venous sinuses: Within the limitations of contrast timing, no convincing thrombus. Anatomic variants: As described. Review of the MIP images confirms the above findings IMPRESSION: CT head: 1. Acute/early subacute appearing right PCA territory cortical/subcortical infarct within the right parietooccipital lobes and callosal splenium. No significant mass effect at this time. No evidence of hemorrhagic conversion. 2. 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 3. Background moderate chronic small vessel ischemic changes within the cerebral white matter. 4. Mild generalized parenchymal atrophy. CTA neck: 1. The non-dominant right vertebral artery becomes occluded at the C1-skull base level. 2. The dominant left vertebral artery is patent throughout the neck without stenosis. 3. Common and internal carotid arteries patent within the neck bilaterally without stenosis. 4. 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 CTA head: 1. Occlusion of a P3 segment right posterior cerebral artery branch. 2. Severe stenosis of a right posterior cerebral artery branch at the P2/P3 junction. 3. Severe stenosis within the P2 segment of the left posterior cerebral artery. 4. Reconstitution of enhancement within the intracranial right vertebral artery, likely due to retrograde flow. 5. Mild/moderate stenosis within the V4 left vertebral artery. 6. 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 7. Moderate stenosis of the right anterior cerebral artery A1 segment. 8. Atherosclerotic plaque within the intracranial internal carotid arteries bilaterally with no more than mild stenosis. Electronically Signed: By: Jackey Loge D.O. On: 04/04/2021 15:45    CT ANGIO NECK W OR WO CONTRAST  Addendum Date: 04/04/2021   ADDENDUM REPORT: 04/04/2021 15:55 ADDENDUM: CT head impression  1, CTA neck impression 1 and CTA head impressions 1, 2 and 6 called by telephone at the time of interpretation on 04/04/2021 at 3:50 pm to provider Dr. Denton Lank, who verbally acknowledged these results. Electronically Signed   By: Jackey Loge D.O.   On: 04/04/2021 15:55   Result Date: 04/04/2021 CLINICAL DATA:  Vertigo, central. Additional history provided: Sinus headache, vertigo, confusion. EXAM: CT ANGIOGRAPHY HEAD AND NECK TECHNIQUE: Multidetector CT imaging of the head and neck was performed using the standard protocol during bolus administration of intravenous contrast. Multiplanar CT image reconstructions and MIPs were obtained to evaluate the vascular anatomy. Carotid stenosis measurements (when applicable) are obtained utilizing NASCET criteria, using the distal internal carotid diameter as the denominator. CONTRAST:  31mL OMNIPAQUE IOHEXOL 350 MG/ML SOLN COMPARISON:  Head CT 04/02/2021. FINDINGS: CT HEAD FINDINGS Brain: Mild generalized cerebral atrophy. Acute/subacute appearing cortical/subcortical infarct within the right parietooccipital lobes and callosal splenium (right PCA vascular territory). No significant mass effect at this time. No evidence of hemorrhagic conversion. 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 Background moderate patchy and ill-defined hypoattenuation within the cerebral white matter, nonspecific but compatible with chronic small vessel ischemic disease. There is no acute intracranial hemorrhage. No extra-axial fluid collection. No evidence of an intracranial mass. No midline shift. Vascular: Reported below. Skull: Normal. Negative for fracture or focal lesion. Sinuses: Postsurgical appearance of the paranasal sinuses. Small mucous retention cyst versus polypoid mucosal thickening within the left maxillary sinus. Orbits: No acute or  significant orbital finding. Review of the MIP images confirms the above findings CTA NECK FINDINGS Aortic arch: Standard aortic branching. Atherosclerotic plaque within the proximal major branch vessels of the neck. No hemodynamically significant innominate or proximal subclavian artery stenosis. Right carotid system: CCA and ICA patent within the neck without stenosis. Minimal soft and calcified plaque within the CCA. Left carotid system: CCA and ICA patent within the neck without stenosis. Minimal atherosclerotic plaque about the carotid bifurcation. Vertebral arteries: The non dominant right vertebral artery becomes occluded at the C1-skull base level. The dominant left vertebral artery is patent within the neck without stenosis. Skeleton: Partially imaged thoracic dextrocurvature. Multilevel bridging ventrolateral osteophytes with relative preservation of the intervertebral disc spaces. 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 No acute bony abnormality or aggressive osseous lesion. Other neck: 1.4 cm right thyroid lobe nodule, not meeting consensus criteria for ultrasound follow-up based on size. Upper chest: No consolidation within the imaged lung apices. Review of the MIP images confirms the above findings CTA HEAD FINDINGS Anterior circulation: The intracranial internal carotid arteries are patent. Calcified plaque within both vessels with no more than mild stenosis. There is a severe, near occlusive stenosis of the proximal to mid M1 left middle cerebral artery (series 14, image 57). The M1 right middle cerebral artery is patent with mild atherosclerotic irregularity. No M2 proximal branch occlusion or high-grade proximal stenosis is identified. The anterior cerebral arteries are patent. Moderate stenosis within the proximal right A1 segment. Posterior circulation: There is reconstitution of enhancement within the V4 right vertebral artery, likely due to retrograde flow.  Enhancement is present within the proximal right PICA. Mild-to-moderate atherosclerotic narrowing of the V4 left vertebral artery. The basilar artery is patent. Severe stenosis of a right PCA branch at the P2/P3 junction. Occlusion of a right PCA branch at the P3 segment. The left cerebral artery is fetal in origin and patent. However, there is a severe stenosis within the P2 left PCA. The right posterior  communicating artery is diminutive or absent. Venous sinuses: Within the limitations of contrast timing, no convincing thrombus. Anatomic variants: As described. Review of the MIP images confirms the above findings IMPRESSION: CT head: 1. Acute/early subacute appearing right PCA territory cortical/subcortical infarct within the right parietooccipital lobes and callosal splenium. No significant mass effect at this time. No evidence of hemorrhagic conversion. 2. 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 3. Background moderate chronic small vessel ischemic changes within the cerebral white matter. 4. Mild generalized parenchymal atrophy. CTA neck: 1. The non-dominant right vertebral artery becomes occluded at the C1-skull base level. 2. The dominant left vertebral artery is patent throughout the neck without stenosis. 3. Common and internal carotid arteries patent within the neck bilaterally without stenosis. 4. 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 CTA head: 1. Occlusion of a P3 segment right posterior cerebral artery branch. 2. Severe stenosis of a right posterior cerebral artery branch at the P2/P3 junction. 3. Severe stenosis within the P2 segment of the left posterior cerebral artery. 4. Reconstitution of enhancement within the intracranial right vertebral artery, likely due to retrograde flow. 5. Mild/moderate stenosis within the V4 left vertebral artery. 6. 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 7. Moderate stenosis of the right  anterior cerebral artery A1 segment. 8. Atherosclerotic plaque within the intracranial internal carotid arteries bilaterally with no more than mild stenosis. Electronically Signed: By: Jackey Loge D.O. On: 04/04/2021 15:45   MR BRAIN WO CONTRAST  Result Date: 04/05/2021 CLINICAL DATA:  Dizziness EXAM: MRI HEAD WITHOUT CONTRAST TECHNIQUE: Multiplanar, multiecho pulse sequences of the brain and surrounding structures were obtained without intravenous contrast. COMPARISON:  None. FINDINGS: Brain: Multifocal abnormal diffusion restriction within both cerebral hemispheres right worse than left, in the left middle cerebellar peduncle and the inferior left cerebellum. No acute or chronic hemorrhage. Hyperintense T2-weighted signal is moderately widespread throughout the white matter. Generalized volume loss without a clear lobar predilection. The midline structures are normal. Vascular: Major flow voids are preserved. Skull and upper cervical spine: Normal calvarium and skull base. Visualized upper cervical spine and soft tissues are normal. Sinuses/Orbits:No paranasal sinus fluid levels or advanced mucosal thickening. No mastoid or middle ear effusion. Normal orbits. 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 Ischemia in the left middle cerebellar peduncle and inferior left cerebellum may be slightly older but is still in the recent subacute phase. No hemorrhage or mass effect. Electronically Signed   By: Deatra Robinson M.D.   On: 04/05/2021 03:19   ECHOCARDIOGRAM COMPLETE  Result Date: 04/05/2021    ECHOCARDIOGRAM REPORT   Patient Name:   JERELD BICKMORE  Date of Exam: 04/05/2021 Medical Rec #:  [id number]  Height:       68.0 in Accession #:    [id number] Weight:       208.2 lb Date of Birth:  01-12-1946 BSA:          2.079 m Patient Age:    74 years   BP:           148/87 mmHg Patient Gender: M          HR:           82 bpm. Exam Location:  Inpatient Procedure: 2D  Echo, Color Doppler and Cardiac Doppler Indications:    Stroke i63.9  History:        Patient has no prior history of Echocardiogram examinations.  Risk Factors:Hypertension and Sleep Apnea.  Sonographer:    Alvera Novel Referring Phys: [id number] [facility] KHALIQDINA IMPRESSIONS  1. Consider further imaging of the aorta with CTA given enlargement.  2. Left ventricular ejection fraction, by estimation, is 60 to 65%. The left ventricle has normal function. The left ventricle has no regional wall motion abnormalities. There is mild left ventricular hypertrophy. Left ventricular diastolic parameters were normal.  3. Right ventricular systolic function is normal. The right ventricular size is normal. There is normal pulmonary artery systolic pressure.  4. Left atrial size was moderately dilated.  5. The mitral valve is normal in structure. No evidence of mitral valve regurgitation. No evidence of mitral stenosis.  6. The aortic valve is tricuspid. Aortic valve regurgitation is not visualized. No aortic stenosis is present.  7. Aortic dilatation noted. There is mild dilatation of the aortic root, measuring 39 mm. 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  8. The inferior vena cava is normal in size with greater than 50% respiratory variability, suggesting right atrial pressure of 3 mmHg. FINDINGS  Left Ventricle: Left ventricular ejection fraction, by estimation, is 60 to 65%. The left ventricle has normal function. The left ventricle has no regional wall motion abnormalities. The left ventricular internal cavity size was normal in size. There is  mild left ventricular hypertrophy. Left ventricular diastolic parameters were normal. Right Ventricle: The right ventricular size is normal. No increase in right ventricular wall thickness. Right ventricular systolic function is normal. There is normal pulmonary artery systolic pressure. The tricuspid regurgitant velocity is 2.03 m/s, and   with an assumed right atrial pressure of 8 mmHg, the estimated right ventricular systolic pressure is 24.5 mmHg. Left Atrium: Left atrial size was moderately dilated. Right Atrium: Right atrial size was normal in size. Pericardium: There is no evidence of pericardial effusion. Mitral Valve: The mitral valve is normal in structure. No evidence of mitral valve regurgitation. No evidence of mitral valve stenosis. Tricuspid Valve: The tricuspid valve is normal in structure. Tricuspid valve regurgitation is not demonstrated. No evidence of tricuspid stenosis. Aortic Valve: The aortic valve is tricuspid. Aortic valve regurgitation is not visualized. No aortic stenosis is present. Pulmonic Valve: The pulmonic valve was normal in structure. Pulmonic valve regurgitation is not visualized. No evidence of pulmonic stenosis. Aorta: Aortic dilatation noted. There is mild dilatation of the aortic root, measuring 39 mm. 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 Venous: The inferior vena cava is normal in size with greater than 50% respiratory variability, suggesting right atrial pressure of 3 mmHg. IAS/Shunts: No atrial level shunt detected by color flow Doppler. Additional Comments: Consider further imaging of the aorta with CTA given enlargement.  LEFT VENTRICLE PLAX 2D LVIDd:         4.80 cm   Diastology LVIDs:         3.30 cm   LV e' medial:    7.18 cm/s LV PW:         1.20 cm   LV E/e' medial:  7.7 LV IVS:        1.20 cm   LV e' lateral:   14.70 cm/s LVOT diam:     2.20 cm   LV E/e' lateral: 3.8 LV SV:         105 LV SV Index:   50 LVOT Area:     3.80 cm  RIGHT VENTRICLE RV S prime:     22.80 cm/s TAPSE (M-mode):  3.0 cm LEFT ATRIUM             Index        RIGHT ATRIUM           Index LA diam:        4.50 cm 2.16 cm/m   RA Area:     19.80 cm LA Vol (A2C):   56.7 ml 27.27 ml/m  RA Volume:   55.90 ml  26.89 ml/m LA Vol (A4C):   62.8 ml 30.21 ml/m LA Biplane Vol: 61.3 ml 29.49 ml/m  AORTIC VALVE  LVOT Vmax:   148.00 cm/s LVOT Vmean:  96.000 cm/s LVOT VTI:    0.276 m  AORTA Ao Root diam: 3.90 cm Ao Asc diam:  4.50 cm MITRAL VALVE               TRICUSPID VALVE MV Area (PHT): 2.09 cm    TR Peak grad:   16.5 mmHg MV Decel Time: 363 msec    TR Vmax:        203.00 cm/s MV E velocity: 55.50 cm/s MV A velocity: 72.30 cm/s  SHUNTS MV E/A ratio:  0.77        Systemic VTI:  0.28 m                            Systemic Diam: 2.20 cm Charlton Haws MD Electronically signed by Charlton Haws MD Signature Date/Time: 04/05/2021/4:20:55 PM    Final    IR NEPHROSTOMY PLACEMENT RIGHT  Result Date: 04/07/2021 INDICATION: 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 As such, request made for placement of a right-sided percutaneous nephrostomy catheter for infection source control purposes. EXAM: ULTRASOUND AND FLUOROSCOPIC GUIDED PLACEMENT OF LEFT NEPHROSTOMY TUBE COMPARISON:  None. MEDICATIONS: Ciprofloxacin 400 mg IV; The antibiotic was administered in an appropriate time frame prior to skin puncture. ANESTHESIA/SEDATION: Moderate (conscious) sedation was employed during this procedure as administered by the Interventional Radiology RN. A total of Versed 1.5 mg and Fentanyl 75 mcg was administered intravenously. Moderate Sedation Time: 32 minutes. The patient's level of consciousness and vital signs were monitored continuously by radiology nursing throughout the procedure under my direct supervision. CONTRAST:  10 cc mL Isovue 300 - administered into the renal collecting system FLUOROSCOPY TIME:  18 seconds (8 mGy) COMPLICATIONS: None immediate. PROCEDURE: The procedure, risks, benefits, and alternatives were explained to the patient, questions were encouraged and answered and informed  consent was obtained. A timeout was performed prior to the initiation of the procedure. The operative site was prepped and draped in the usual sterile fashion and a sterile drape was applied covering the operative field. A sterile gown and sterile gloves were used for the procedure. Local anesthesia was provided with 1% Lidocaine with epinephrine. Ultrasound was used to localize the right kidney. Under direct ultrasound guidance, a 20 gauge needle was advanced into the renal collecting system. An ultrasound image documentation was performed. Access within the collecting system was confirmed with the efflux of purulent urine followed by limited contrast injection. Under intermittent fluoroscopic guidance, an 0.018 wire was advanced into the collecting system and the tract was dilated with an Accustick stent. Next, over a short Amplatz wire, the track was further dilated ultimately allowing placement of a 14-French percutaneous  nephrostomy catheter with end coiled and locked within the renal pelvis. Following nephrostomy catheter placement, 3 L of purulent urine was aspirated. A small amount of aspirated purulent urine was capped and sent to the laboratory for analysis. A postprocedural spot fluoroscopic images were obtained in various obliquities. The catheter was secured at the skin entrance site with an interrupted suture and a stat lock device and connected to a gravity bag. Dressings were applied. The patient tolerated procedure well without immediate postprocedural complication. FINDINGS: Ultrasound scanning demonstrates a severe dilatation of the right kidney without any identifiable renal parenchyma. Under a combination of ultrasound fluoroscopic guidance, the right renal pelvis was accessed allowing placement of a 14 French percutaneous nephrostomy catheter. IMPRESSION: Successful ultrasound and fluoroscopic guided placement of a right sided 30 French PCN yielding 3 L of purulent urine. A small amount of  aspirated purulent urine was capped and sent to the laboratory for analysis. 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 Routine nephrostomy catheter exchange may be performed in 6-8 weeks unless nephroureteral intervention is pursued in the interval. Electronically Signed   By: Simonne Come M.D.   On: 04/07/2021 15:50    Labs:  CBC: Recent Labs    04/04/21 0311 04/05/21 0200 04/06/21 0443 04/08/21 0503  WBC 17.3* 15.0* 14.6* 17.2*  HGB 10.9* 10.8* 11.0* 11.0*  HCT 32.8* 33.0* 33.2* 34.3*  PLT 352 323 305 325    COAGS: Recent Labs    04/06/21 1506  INR 1.2    BMP: Recent Labs    04/04/21 0311 04/05/21 0200 04/06/21 0443 04/08/21 0503  NA 140 140 139 139  K 3.4* 3.4* 3.7 4.2  CL 107 108 107 108  CO2 23 21* 22 22  GLUCOSE 131* 124* 129* 113*  BUN 29* 24* 24* 30*  CALCIUM 8.3* 8.5* 8.5* 8.5*  CREATININE 1.67* 1.60* 1.44* 1.46*  GFRNONAA 43* 45* 51* 50*    LIVER FUNCTION TESTS: Recent Labs    04/01/21 2255 04/02/21 0559 04/03/21 0600 04/04/21 0311  BILITOT 0.8 0.6 0.4 0.7  AST 42* 32 32 26  ALT 46* 41 42 35  ALKPHOS 75 74 70 71  PROT 6.2* 6.2* 5.7* 5.8*  ALBUMIN 2.2* 2.3* 1.9* 2.1*    Assessment and Plan: Patient with history of retained right ureteral stent complicated by stent failure/obstruction and 3 cm bladder stone, febrile UTI; recent CVA; status post right percutaneous nephrostomy 10/14; current temp 98.8, creatinine 1.46 up slightly from 1.44, WBC 17.2 up from 14.6, hemoglobin stable, urine culture pending, nephrostomy output 800 cc yesterday, 100 cc today blood-tinged urine; continue current treatment/hydrate/antbx; additional plans as outlined by urology/neurology/TRH  As per Dr. 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   Routine nephrostomy catheter exchange may be performed in 6-8 weeks unless nephroureteral intervention is pursued in the interval.  Electronically Signed: D. Jeananne Rama, PA-C 04/08/2021, 11:35 AM   I spent a total of 15 Minutes at the the patient's bedside AND on the patient's hospital floor or unit, greater than 50% of which was counseling/coordinating care for

## 2021-04-09 DIAGNOSIS — I10 Essential (primary) hypertension: Secondary | ICD-10-CM | POA: Diagnosis not present

## 2021-04-09 LAB — BASIC METABOLIC PANEL
Anion gap: 9 (ref 5–15)
BUN: 26 mg/dL — ABNORMAL HIGH (ref 8–23)
CO2: 21 mmol/L — ABNORMAL LOW (ref 22–32)
Calcium: 8.4 mg/dL — ABNORMAL LOW (ref 8.9–10.3)
Chloride: 107 mmol/L (ref 98–111)
Creatinine, Ser: 1.38 mg/dL — ABNORMAL HIGH (ref 0.61–1.24)
GFR, Estimated: 54 mL/min — ABNORMAL LOW (ref >60)
Glucose, Bld: 118 mg/dL — ABNORMAL HIGH (ref 70–99)
Potassium: 4.3 mmol/L (ref 3.5–5.1)
Sodium: 137 mmol/L (ref 135–145)

## 2021-04-09 LAB — CBC
HCT: 32.6 % — ABNORMAL LOW (ref 39.0–52.0)
Hemoglobin: 10.7 g/dL — ABNORMAL LOW (ref 13.0–17.0)
MCH: 28.8 pg (ref 26.0–34.0)
MCHC: 32.8 g/dL (ref 30.0–36.0)
MCV: 87.9 fL (ref 80.0–100.0)
Platelets: 309 10*3/uL (ref 150–400)
RBC: 3.71 MIL/uL — ABNORMAL LOW (ref 4.22–5.81)
RDW: 15.5 % (ref 11.5–15.5)
WBC: 16 10*3/uL — ABNORMAL HIGH (ref 4.0–10.5)
nRBC: 0 % (ref 0.0–0.2)

## 2021-04-09 LAB — MAGNESIUM: Magnesium: 2.2 mg/dL (ref 1.7–2.4)

## 2021-04-09 LAB — PHOSPHORUS: Phosphorus: 4.4 mg/dL (ref 2.5–4.6)

## 2021-04-09 MED ORDER — FLUOXETINE HCL 20 MG PO CAPS
40.0000 mg | ORAL_CAPSULE | Freq: Every day | ORAL | Status: DC
  Administered 2021-04-09 – 2021-04-14 (×6): 40 mg via ORAL
  Filled 2021-04-09 (×6): qty 2

## 2021-04-09 NOTE — TOC Progression Note (Signed)
Transition of Care [facility]) - Progression Note    Patient Details  Name: Grant Swager MRN: [id number] Date of Birth: 06-24-1946  Transition of Care [facility]) CM/SW [place], [place] Phone Number: 04/09/2021, 1:59 PM  Clinical Narrative:   CSW met with patient to discuss recommendation for inpatient rehab. Patient in agreement with rehab, and understanding that CIR is out of network. Patient agreeable to either [place] or [facility], whoever has a bed available. CSW answered patient questions. CSW to send referral to both [facility] and [facility] IR.     Expected Discharge Plan: IP Rehab Facility Barriers to Discharge: Continued Medical Work up, Orthoptist and Services Expected Discharge Plan: [place] In-house Referral: Clinical Social Work   Post Acute Care Choice: [place] arrangements for the past 2 months: Single Family Home                 DME Arranged: N/A DME Agency: NA       HH Arranged: PT HH Agency: [place] ([place]) Date HH Agency Contacted: 04/04/21 Time HH Agency Contacted: 1400 Representative spoke with at [place]: [place] ([place]) Interventions    Readmission Risk Interventions No flowsheet data found.

## 2021-04-09 NOTE — Progress Notes (Addendum)
PROGRESS NOTE    Nicholas Mckenzie  OBS:[id number] DOB: [birth year] DOA: 04/01/2021 PCP: Eartha Inch, MD   Chief Complaint  Patient presents with   Emesis   Diarrhea   Weakness   Brief Narrative/Hospital Course:  Nicholas Mckenzie, 75 y.o. male with PMH of  htn, nephrolithiasis,  OSA on CPAP,  CKD3a,depression p/w fatigue, lightheadedness, dysuria, confusion, nausea, vomiting, and loose stools.  He had recent admission 3 wks ago for polypharmacy/accidental overdose.  In the ED, found to have complicated UTI in the setting of right ureteral stent obstruction.  Seen by urology status post percutaneous nephrostomy by IR 10/14 with 3 L of purulent fluid aspirated following PCN placement. Hospital course complicated by altered mental status central vertigo and acute/subacute CVA on 04/05/2021 involving the right PCA territory cortical/subcortical infarct, chronic lacunar infarct in the right basal ganglia and seen by neurology/stroke team. Due to persistent leukocytosis antibiotics switched to Zosyn 10/15  Subjective:  Overnight T max 99.9 Wbc down 16, creat at 1.3 Wife daughter at bedside Did not work with PT yesterday Is alert, awake no complaints Nephrostomy tube drain +  Assessment & Plan:  Acute/subacute CVA involving right PCA territory, both cerebral hemisphere, right worse than left: MRI brain completed 10/12.  Seen by neurology recommended to continue aspirin 81, Plavix 75 x 3 months followed by aspirin alone and without any break in therapy unless for urgent or emergent issues procedure or surgery.  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  3 L of purulent fluid removed following PCN.  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  IR Planning for nephrostomy catheter exchange in 6 to 8 weeks.  Culture from PCN 10/14-[place] Enterobacter species, pending C/S . Cont Zosyn leukocytosis remains high but slowly better.  Dr. Laverle Patter from urology mentioned plan to reevaluate for operative intervention early next week but will likely need to delay given need for blood thinners due to his stroke. Recent Labs  Lab 04/04/21 0311 04/05/21 0200 04/06/21 0443 04/08/21 0503 04/09/21 0452  WBC 17.3* 15.0* 14.6* 17.2* 16.0*   CKD stage IIIa: Renal function is stable at baseline.monitor [place] metabolic acidosis monitor bicarb. Recent Labs  Lab 04/04/21 0311 04/05/21 0200 04/06/21 0443 04/08/21 0503 04/09/21 0452  BUN 29* 24* 24* 30* 26*  CREATININE 1.67* 1.60* 1.44* 1.46* 1.38*    Essential hypertension: BP currently controlled on 5mg   of amlodipine.  At home on Amlodipine, Toprol 50 mg recently increased, telmisartan 80 mg.  Monitor blood pressure and resume home meds if BP starts to trend up further  Acute metabolic encephalopathy in the setting of sepsis, stroke likely multifactorial.  Currently is alert awake oriented at baseline.  Recent admission for polypharmacy.  Continue delirium precaution, supportive care.  Ascending aortic aneurysm 4.5 cm-seen in TTE, recommended follow-up CTA.  Continue BP control.  Nausea/vomiting/diarrhea: Improving.  Continue  supportive care  Transaminitis resolved  Depression: Followed by outpatient psychiatry.  Patient is on Prozac 40 and Seroquel 50  and melatonin at home -after discussion wife prefers only to resume prozac. If needed we can consider low dose w seroquel at night, cont on melatonin  OSA continue CPAP nightly  Class I Obesity:Patient's Body mass index is 31.66 kg/m. : Will benefit with PCP follow-up, weight loss  healthy lifestyle.  DVT prophylaxis: enoxaparin (LOVENOX) injection 40 mg Start: 04/02/21 1000 Code Status:   Code Status: Full Code Family Communication: plan of care discussed with patient'S WIFE/DAUGHTER at bedside. Status is: Inpatient  Remains inpatient appropriate because: Of ongoing IV antibiotic needs. Is from Home with family PT OT has recommended skilled nursing facility  Anticipated discharge : Likely early next week   Objective: Vitals: Today's Vitals   04/08/21 2032 04/08/21 2327 04/09/21 0423 04/09/21 0824  BP: 137/77 128/68 114/70 119/78  Pulse: 87 79 70 70  Resp: (!) 21 (!) 21 17 14   Temp: 99.7 F (37.6 C) 97.6 F (36.4 C) 98.4 F (36.9 C) 99.1 F (37.3 C)  TempSrc: Oral Axillary Axillary Oral  SpO2: 96% 100% 97% 96%  Weight:      Height:      PainSc: 0-No pain  Asleep    Physical Examination: General exam: AA oriented , pleasant,older than stated age. HEENT:Oral mucosa moist, Ear/Nose WNL grossly,dentition normal. Respiratory system: B/l clear BS, no use of accessory muscle, non tender. Cardiovascular system: S1 & S2 +,No JVD. Gastrointestinal system: Abdomen soft, NT,ND, BS+. Nervous System:Alert, awake, moving extremities well UE/LE and non focal. Extremities: edema none, distal peripheral pulses palpable.  Skin: No rashes, no icterus. MSK: Normal muscle bulk, tone, power.  Foley- Nephrostomy tube bag+  Medications reviewed:  Scheduled Meds:  amLODipine  5 mg Oral Daily   aspirin  81 mg Oral Daily   Or   aspirin  300 mg Rectal Daily    clopidogrel  75 mg Oral Daily   enoxaparin (LOVENOX) injection  40 mg Subcutaneous Q24H   lactobacillus  1 g Oral BID   melatonin  3 mg Oral QHS   sodium chloride flush  5 mL Intracatheter Q8H   Continuous Infusions:  piperacillin-tazobactam (ZOSYN)  IV 3.375 g (04/09/21 0423)   promethazine (PHENERGAN) injection (IM or IVPB)     Diet Order             DIET DYS 2 Room service appropriate? Yes; Fluid consistency: Thin  Diet effective now                          Intake/Output  Intake/Output Summary (Last 24 hours) at 04/09/2021 0825 Last data filed at 04/08/2021 2037 Gross per 24 hour  Intake 15.18 ml  Output 540 ml  Net -524.82 ml   Intake/Output from previous day: 10/15 0701 - 10/16 0700 In: 25.2 [I.V.:5; IV Piggyback:10.2] Out: 990 [Urine:990] Net IO Since Admission: -2,718.23 mL [04/09/21 0825]   Weight change:   Wt Readings from Last 3 Encounters:  04/03/21 94.4 kg  03/11/21 98.5 kg  02/10/21 104.3 kg     Consultants:see note  Procedures:see note Antimicrobials: Anti-infectives (From admission, onward)    Start     Dose/Rate Route Frequency Ordered Stop   04/08/21 1200  piperacillin-tazobactam (ZOSYN) IVPB 3.375 g        3.375 g 12.5 mL/hr over 240 Minutes Intravenous Every  8 hours 04/08/21 0551     04/08/21 0600  piperacillin-tazobactam (ZOSYN) IVPB 3.375 g        3.375 g 100 mL/hr over 30 Minutes Intravenous  Once 04/08/21 0551 04/08/21 0701   04/03/21 0215  cefTRIAXone (ROCEPHIN) 1 g in sodium chloride 0.9 % 100 mL IVPB  Status:  Discontinued        1 g 200 mL/hr over 30 Minutes Intravenous Every 24 hours 04/02/21 0344 04/08/21 0538   04/02/21 0215  cefTRIAXone (ROCEPHIN) 1 g in sodium chloride 0.9 % 100 mL IVPB        1 g 200 mL/hr over 30 Minutes Intravenous  Once 04/02/21 0213 04/02/21 0251      Culture/Microbiology    Component Value Date/Time   SDES ABSCESS 04/07/2021 1256   SPECREQUEST POST RIGHT SIDED PCN PLACEMENT 04/07/2021 1256    CULT  04/07/2021 1256    TOO YOUNG TO READ Performed at [facility], [address], [place], [place] [address]    REPTSTATUS PENDING 04/07/2021 1256    Other culture-see note  Unresulted Labs (From admission, onward)     Start     Ordered   04/09/21 0500  Creatinine, serum  (enoxaparin (LOVENOX)    CrCl >/= 30 ml/min)  Weekly,   R     Comments: while on enoxaparin therapy    04/02/21 0344   04/06/21 0500  CBC  Every 48 hours,   R     Question:  Specimen collection method  Answer:  Lab=Lab collect   04/05/21 1848   04/06/21 0500  Basic metabolic panel  Every 48 hours,   R     Question:  Specimen collection method  Answer:  Lab=Lab collect   04/05/21 1848           Data Reviewed: I have personally reviewed following labs and imaging studies CBC: Recent Labs  Lab 04/04/21 0311 04/05/21 0200 04/06/21 0443 04/08/21 0503 04/09/21 0452  WBC 17.3* 15.0* 14.6* 17.2* 16.0*  HGB 10.9* 10.8* 11.0* 11.0* 10.7*  HCT 32.8* 33.0* 33.2* 34.3* 32.6*  MCV 85.0 85.9 86.0 88.4 87.9  PLT 352 323 305 325 309   Basic Metabolic Panel: Recent Labs  Lab 04/04/21 0311 04/05/21 0200 04/06/21 0443 04/08/21 0503 04/09/21 0452  NA 140 140 139 139 137  K 3.4* 3.4* 3.7 4.2 4.3  CL 107 108 107 108 107  CO2 23 21* 22 22 21*  GLUCOSE 131* 124* 129* 113* 118*  BUN 29* 24* 24* 30* 26*  CREATININE 1.67* 1.60* 1.44* 1.46* 1.38*  CALCIUM 8.3* 8.5* 8.5* 8.5* 8.4*  MG 1.9  --   --   --  2.2  PHOS  --   --   --   --  4.4   GFR: Estimated Creatinine Clearance: 53.1 mL/min (A) (by C-G formula based on SCr of 1.38 mg/dL (H)). Liver Function Tests: Recent Labs  Lab 04/03/21 0600 04/04/21 0311  AST 32 26  ALT 42 35  ALKPHOS 70 71  BILITOT 0.4 0.7  PROT 5.7* 5.8*  ALBUMIN 1.9* 2.1*   No results for input(s): LIPASE, AMYLASE in the last 168 hours. No results for input(s): AMMONIA in the last 168 hours. Coagulation Profile: Recent Labs  Lab 04/06/21 1506  INR 1.2   Cardiac  Enzymes: No results for input(s): CKTOTAL, CKMB, CKMBINDEX, TROPONINI in the last 168 hours. BNP (last 3 results) No results for input(s): PROBNP in the last 8760 hours. HbA1C: No results for  input(s): HGBA1C in the last 72 hours. CBG: No results for input(s): GLUCAP in the last 168 hours. Lipid Profile: No results for input(s): CHOL, HDL, LDLCALC, TRIG, CHOLHDL, LDLDIRECT in the last 72 hours. Thyroid Function Tests: No results for input(s): TSH, T4TOTAL, FREET4, T3FREE, THYROIDAB in the last 72 hours. Anemia Panel: No results for input(s): VITAMINB12, FOLATE, FERRITIN, TIBC, IRON, RETICCTPCT in the last 72 hours. Sepsis Labs: No results for input(s): PROCALCITON, LATICACIDVEN in the last 168 hours.  Recent Results (from the past 240 hour(s))  Resp Panel by RT-PCR (Flu A&B, Covid) Nasopharyngeal Swab     Status: None   Collection Time: 04/01/21  8:00 PM   Specimen: Nasopharyngeal Swab; Nasopharyngeal(NP) swabs in vial transport medium  Result Value Ref Range Status   SARS Coronavirus 2 by RT PCR NEGATIVE NEGATIVE Final    Comment: (NOTE) SARS-CoV-2 target nucleic acids are NOT DETECTED.  The SARS-CoV-2 RNA is generally detectable in upper respiratory specimens during the acute phase of infection. The lowest concentration of SARS-CoV-2 viral copies this assay can detect is 138 copies/mL. A negative result does not preclude SARS-Cov-2 infection and should not be used as the sole basis for treatment or other patient management decisions. A negative result may occur with  improper specimen collection/handling, submission of specimen other than nasopharyngeal swab, presence of viral mutation(s) within the areas targeted by this assay, and inadequate number of viral copies(<138 copies/mL). A negative result must be combined with clinical observations, patient history, and epidemiological information. The expected result is Negative.  Fact Sheet for Patients:   BloggerCourse.com  Fact Sheet for Healthcare Providers:  SeriousBroker.it  This test is no t yet approved or cleared by the [place] FDA and  has been authorized for detection and/or diagnosis of SARS-CoV-2 by FDA under an Emergency Use Authorization (EUA). This EUA will remain  in effect (meaning this test can be used) for the duration of the COVID-19 declaration under Section 564(b)(1) of the Act, 21 U.S.C.section 360bbb-3(b)(1), unless the authorization is terminated  or revoked sooner.       Influenza A by PCR NEGATIVE NEGATIVE Final   Influenza B by PCR NEGATIVE NEGATIVE Final    Comment: (NOTE) The Xpert Xpress SARS-CoV-2/FLU/RSV plus assay is intended as an aid in the diagnosis of influenza from Nasopharyngeal swab specimens and should not be used as a sole basis for treatment. Nasal washings and aspirates are unacceptable for Xpert Xpress SARS-CoV-2/FLU/RSV testing.  Fact Sheet for Patients: BloggerCourse.com  Fact Sheet for Healthcare Providers: SeriousBroker.it  This test is not yet approved or cleared by the [place] FDA and has been authorized for detection and/or diagnosis of SARS-CoV-2 by FDA under an Emergency Use Authorization (EUA). This EUA will remain in effect (meaning this test can be used) for the duration of the COVID-19 declaration under Section 564(b)(1) of the Act, 21 U.S.C. section 360bbb-3(b)(1), unless the authorization is terminated or revoked.  Performed at [facility], [address], [place], [place] [address]   Urine Culture     Status: Abnormal   Collection Time: 04/02/21  2:41 AM   Specimen: Urine, Clean Catch  Result Value Ref Range Status   Specimen Description   Final    URINE, CLEAN CATCH Performed at [facility], [address], [place], [place] [address]    Special Requests    Final    NONE Performed at [facility], [address], [place], [place] [address]  Culture (A)  Final    >=100,000 COLONIES/mL ENTEROBACTER CLOACAE 50,000 COLONIES/mL AEROCOCCUS SPECIES Standardized susceptibility testing for this organism is not available. Performed at [facility], [address], [place], [place] [address]    Report Status 04/05/2021 FINAL  Final   Organism ID, Bacteria ENTEROBACTER CLOACAE (A)  Final      Susceptibility   Enterobacter cloacae - MIC*    CEFAZOLIN >=64 RESISTANT Resistant     CEFEPIME <=0.12 SENSITIVE Sensitive     CIPROFLOXACIN <=0.25 SENSITIVE Sensitive     GENTAMICIN <=1 SENSITIVE Sensitive     IMIPENEM <=0.25 SENSITIVE Sensitive     NITROFURANTOIN 64 INTERMEDIATE Intermediate     TRIMETH/SULFA >=320 RESISTANT Resistant     PIP/TAZO 8 SENSITIVE Sensitive     * >=100,000 COLONIES/mL ENTEROBACTER CLOACAE  Culture, blood (routine x 2)     Status: None   Collection Time: 04/02/21 11:24 PM   Specimen: BLOOD  Result Value Ref Range Status   Specimen Description   Final    BLOOD BLOOD RIGHT HAND Performed at [facility], [address], [place], [place] [address]    Special Requests   Final    BOTTLES DRAWN AEROBIC AND ANAEROBIC Blood Culture adequate volume Performed at [facility], [address] [place], [place] [address]    Culture   Final    NO GROWTH 5 DAYS Performed at [facility], [address], [place], [place] [address]    Report Status 04/08/2021 FINAL  Final  Culture, blood (routine x 2)     Status: None   Collection Time: 04/02/21 11:26 PM   Specimen: BLOOD  Result Value Ref Range Status   Specimen Description   Final    BLOOD RIGHT ANTECUBITAL Performed at [facility], [address] [place], [place] [address]    Special Requests   Final    BOTTLES DRAWN AEROBIC AND ANAEROBIC Blood Culture adequate volume Performed at [facility], [address] [place], [place] [address]    Culture   Final    NO GROWTH 5 DAYS Performed at [facility], [address] [place], [place] [address]    Report Status 04/08/2021 FINAL  Final  Aerobic/Anaerobic Culture w Gram Stain (surgical/deep wound)     Status: None (Preliminary result)   Collection Time: 04/07/21 12:56 PM   Specimen: Abscess  Result Value Ref Range Status   Specimen Description ABSCESS  Final   Special Requests POST RIGHT SIDED PCN PLACEMENT  Final   Gram Stain   Final    ABUNDANT WBC PRESENT, PREDOMINANTLY PMN RARE GRAM POSITIVE COCCI    Culture   Final    TOO YOUNG TO READ Performed at [facility], [address] [place], [place] [address]    Report Status PENDING  Incomplete     Radiology Studies: IR NEPHROSTOMY PLACEMENT RIGHT  Result Date: 04/07/2021 INDICATION: 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 As such, request made for placement of a right-sided percutaneous nephrostomy catheter for infection source control purposes. EXAM: ULTRASOUND AND FLUOROSCOPIC GUIDED PLACEMENT OF LEFT NEPHROSTOMY TUBE COMPARISON:  None. MEDICATIONS: Ciprofloxacin 400 mg IV; The antibiotic was administered in an appropriate time frame prior to skin puncture. ANESTHESIA/SEDATION: Moderate (conscious)  sedation was employed during this procedure as administered by the Interventional Radiology RN. A total of Versed 1.5 mg and Fentanyl 75 mcg was administered intravenously. Moderate Sedation Time: 32 minutes. The patient's level of consciousness and vital signs were monitored continuously by radiology nursing throughout the procedure under my direct supervision. CONTRAST:  10 cc mL Isovue 300 -  administered into the renal collecting system FLUOROSCOPY TIME:  18 seconds (8 mGy) COMPLICATIONS: None immediate. PROCEDURE: The procedure, risks, benefits, and alternatives were explained to the patient, questions were encouraged and answered and informed consent was obtained. A timeout was performed prior to the initiation of the procedure. The operative site was prepped and draped in the usual sterile fashion and a sterile drape was applied covering the operative field. A sterile gown and sterile gloves were used for the procedure. Local anesthesia was provided with 1% Lidocaine with epinephrine. Ultrasound was used to localize the right kidney. Under direct ultrasound guidance, a 20 gauge needle was advanced into the renal collecting system. An ultrasound image documentation was performed. Access within the collecting system was confirmed with the efflux of purulent urine followed by limited contrast injection. Under intermittent fluoroscopic guidance, an 0.018 wire was advanced into the collecting system and the tract was dilated with an Accustick stent. Next, over a short Amplatz wire, the track was further dilated ultimately allowing placement of a 14-French percutaneous nephrostomy catheter with end coiled and locked within the renal pelvis. Following nephrostomy catheter placement, 3 L of purulent urine was aspirated. A small amount of aspirated purulent urine was capped and sent to the laboratory for analysis. A postprocedural spot fluoroscopic images were obtained in various obliquities. The catheter was secured at the skin entrance site with an interrupted suture and a stat lock device and connected to a gravity bag. Dressings were applied. The patient tolerated procedure well without immediate postprocedural complication. FINDINGS: Ultrasound scanning demonstrates a severe dilatation of the right kidney without any identifiable renal parenchyma. Under a combination of ultrasound fluoroscopic guidance,  the right renal pelvis was accessed allowing placement of a 14 French percutaneous nephrostomy catheter. IMPRESSION: Successful ultrasound and fluoroscopic guided placement of a right sided 98 French PCN yielding 3 L of purulent urine. A small amount of aspirated purulent urine was capped and sent to the laboratory for analysis. 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 Routine nephrostomy catheter exchange may be performed in 6-8 weeks unless nephroureteral intervention is pursued in the interval. Electronically Signed   By: Simonne Come M.D.   On: 04/07/2021 15:50     LOS: 7 days   Lanae Boast, MD [facility]  04/09/2021, 8:25 AM

## 2021-04-09 NOTE — Progress Notes (Addendum)
Miscellaneous Pharmacy Note  Per Med History, patient was not taking home medications for 2 weeks before admission due to N/V. Home medication list and fill history were largely discordant. Upon speaking with patient, his wife, and two daughters, there has been confusion regarding which medications he should be taking and at what doses.   Based on 9/14 office visit for hypertension with [facility], patient should be taking the following -  amlodipine 10 mg qAM  telmisartan 80 mg qPM metoprolol succinate 25 mg daily (can increase to 2 tablets every day if blood pressure remains uncontrolled) hydralazine 25 mg q8h rosuvastatin 10 mg qHS sildenafil 20 mg PRN ED quetiapine 50 mg qHS fluoxetine 40 mg daily Of note, the temezepam 30 mg qHS PRN insomnia was discontinued.   Wife brought in all of patient's home medications 10/16 and went through each medicine with a pharmacist.  The following medications were taken to be destroyed per family's express wish to avoid polypharmacy - Fluoxetine 20 mg capsules (Qty 67, fill date 01/11/21) Losartan 50 mg tablets (Qty 87, fill date 10/31/18) Losartan 100 mg tablets (Qty 98, fill date 10/11/16) Olmesartan 40 mg tablets (Qty 40, fill date 01/06/21) Trazodone 50 mg tablets (Qty 102, fill date 08/02/16) Amlodipine 5 mg tablets (Qty 4, fill date 02/07/21)  Filbert Schilder, PharmD PGY1 Pharmacy Resident 04/09/2021  2:01 PM  Please check [email or web address] for unit-specific pharmacy phone numbers.

## 2021-04-09 NOTE — Progress Notes (Signed)
Pt already on CPAP upon my arrival.

## 2021-04-10 ENCOUNTER — Other Ambulatory Visit: Payer: Self-pay | Admitting: Radiology

## 2021-04-10 DIAGNOSIS — I10 Essential (primary) hypertension: Secondary | ICD-10-CM | POA: Diagnosis not present

## 2021-04-10 DIAGNOSIS — T83192S Other mechanical complication of urinary stent, sequela: Secondary | ICD-10-CM

## 2021-04-10 LAB — BASIC METABOLIC PANEL
Anion gap: 6 (ref 5–15)
BUN: 23 mg/dL (ref 8–23)
CO2: 23 mmol/L (ref 22–32)
Calcium: 8.4 mg/dL — ABNORMAL LOW (ref 8.9–10.3)
Chloride: 104 mmol/L (ref 98–111)
Creatinine, Ser: 1.4 mg/dL — ABNORMAL HIGH (ref 0.61–1.24)
GFR, Estimated: 53 mL/min — ABNORMAL LOW (ref >60)
Glucose, Bld: 111 mg/dL — ABNORMAL HIGH (ref 70–99)
Potassium: 4.1 mmol/L (ref 3.5–5.1)
Sodium: 133 mmol/L — ABNORMAL LOW (ref 135–145)

## 2021-04-10 LAB — CBC
HCT: 32.3 % — ABNORMAL LOW (ref 39.0–52.0)
Hemoglobin: 10.4 g/dL — ABNORMAL LOW (ref 13.0–17.0)
MCH: 28 pg (ref 26.0–34.0)
MCHC: 32.2 g/dL (ref 30.0–36.0)
MCV: 87.1 fL (ref 80.0–100.0)
Platelets: 338 10*3/uL (ref 150–400)
RBC: 3.71 MIL/uL — ABNORMAL LOW (ref 4.22–5.81)
RDW: 15.4 % (ref 11.5–15.5)
WBC: 13.3 10*3/uL — ABNORMAL HIGH (ref 4.0–10.5)
nRBC: 0 % (ref 0.0–0.2)

## 2021-04-10 MED ORDER — SODIUM CHLORIDE 0.9 % IV SOLN: INTRAVENOUS | Status: DC | PRN

## 2021-04-10 NOTE — Progress Notes (Signed)
Referring Physician(s): Dr. Alease Frame  Supervising Physician: Gilmer Mor  Patient Status:  [facility] - In-pt  Chief Complaint:  Complicated UTI in the presence of a double J stent s/p right sided nephrostomy tube placed on 10.14.22 by Dr. Grace Isaac  Subjective: Patient sitting in recliner. Family at bedside. Patient denies any complaints at this time. Waiting on additional information regarding transfer.   Allergies: Statins  Medications: Prior to Admission medications   Medication Sig Start Date End Date Taking? Authorizing Provider  sildenafil (REVATIO) 20 MG tablet Take 20 mg by mouth 3 (three) times daily.   Yes [provider]  amLODipine (NORVASC) 10 MG tablet Take 1 tablet (10 mg total) by mouth daily. Patient not taking: No sig reported 03/12/21   Rodolph Bong, MD  FLUoxetine (PROZAC) 40 MG capsule Take 40 mg by mouth daily. Patient not taking: Reported on 04/09/2021    [provider]  hydrALAZINE (APRESOLINE) 25 MG tablet Take 1 tablet (25 mg total) by mouth every 8 (eight) hours. Patient not taking: No sig reported 03/12/21   Rodolph Bong, MD  melatonin 3 MG TABS tablet Take 3 mg by mouth at bedtime. Patient not taking: Reported on 04/09/2021    [provider]  metoprolol succinate (TOPROL-XL) 25 MG 24 hr tablet Take 25 mg by mouth daily. Patient not taking: Reported on 04/09/2021    [provider]  Multiple Vitamins-Minerals (MULTIVITAMIN WITH MINERALS) tablet Take 1 tablet by mouth daily. Patient not taking: Reported on 04/09/2021    [provider]  QUEtiapine (SEROQUEL) 50 MG tablet Take 50 mg by mouth at bedtime. Patient not taking: Reported on 04/09/2021    [provider]  rosuvastatin (CRESTOR) 10 MG tablet Take 10 mg by mouth daily. Patient not taking: Reported on 04/09/2021    [provider]  telmisartan (MICARDIS) 80 MG tablet Take 80 mg by mouth daily. Patient not taking:  Reported on 04/09/2021    [provider]     Vital Signs: BP 117/79 (BP Location: Right Arm)   Pulse 81   Temp 97.8 F (36.6 C) (Oral)   Resp 16   Ht 5\' 8"  (1.727 m)   Wt 208 lb 3.2 oz (94.4 kg)   SpO2 99%   BMI 31.66 kg/m   Physical Exam Vitals and nursing note reviewed.  Constitutional:      Appearance: He is well-developed.  HENT:     Head: Normocephalic.  Pulmonary:     Effort: Pulmonary effort is normal.  Genitourinary:    Comments: Right sided nephrostomy tube placed to gravity bag. 20 ml of amber colored fluid noted.  Musculoskeletal:        General: Normal range of motion.     Cervical back: Normal range of motion.  Skin:    General: Skin is dry.  Neurological:     Mental Status: He is alert and oriented to person, place, and time.    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 As such, request made for placement of a right-sided percutaneous nephrostomy catheter for infection source control purposes. EXAM: ULTRASOUND AND FLUOROSCOPIC GUIDED PLACEMENT OF LEFT NEPHROSTOMY TUBE COMPARISON:  None. MEDICATIONS: Ciprofloxacin 400 mg IV; The antibiotic was administered in an appropriate  time frame prior to skin puncture. ANESTHESIA/SEDATION: Moderate (conscious) sedation was employed during this procedure as administered by the Interventional Radiology RN. A total of Versed 1.5 mg and Fentanyl 75 mcg was administered intravenously. Moderate Sedation Time: 32 minutes. The patient's level of consciousness and vital signs were monitored continuously by radiology nursing throughout the procedure under my direct supervision. CONTRAST:  10 cc mL Isovue  300 - administered into the renal collecting system FLUOROSCOPY TIME:  18 seconds (8 mGy) COMPLICATIONS: None immediate. PROCEDURE: The procedure, risks, benefits, and alternatives were explained to the patient, questions were encouraged and answered and informed consent was obtained. A timeout was performed prior to the initiation of the procedure. The operative site was prepped and draped in the usual sterile fashion and a sterile drape was applied covering the operative field. A sterile gown and sterile gloves were used for the procedure. Local anesthesia was provided with 1% Lidocaine with epinephrine. Ultrasound was used to localize the right kidney. Under direct ultrasound guidance, a 20 gauge needle was advanced into the renal collecting system. An ultrasound image documentation was performed. Access within the collecting system was confirmed with the efflux of purulent urine followed by limited contrast injection. Under intermittent fluoroscopic guidance, an 0.018 wire was advanced into the collecting system and the tract was dilated with an Accustick stent. Next, over a short Amplatz wire, the track was further dilated ultimately allowing placement of a 14-French percutaneous nephrostomy catheter with end coiled and locked within the renal pelvis. Following nephrostomy catheter placement, 3 L of purulent urine was aspirated. A small amount of aspirated purulent urine was capped and sent to the laboratory for analysis. A postprocedural spot fluoroscopic images were obtained in various obliquities. The catheter was secured at the skin entrance site with an interrupted suture and a stat lock device and connected to a gravity bag. Dressings were applied. The patient tolerated procedure well without immediate postprocedural complication. FINDINGS: Ultrasound scanning demonstrates a severe dilatation of the right kidney without any identifiable renal parenchyma. Under a combination of ultrasound fluoroscopic  guidance, the right renal pelvis was accessed allowing placement of a 14 French percutaneous nephrostomy catheter. IMPRESSION: Successful ultrasound and fluoroscopic guided placement of a right sided 71 French PCN yielding 3 L of purulent urine. A small amount of aspirated purulent urine was capped and sent to the laboratory for analysis. 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 Routine nephrostomy catheter exchange may be performed in 6-8 weeks unless nephroureteral intervention is pursued in the interval. Electronically Signed   By: Simonne Come M.D.   On: 04/07/2021 15:50    Labs:  CBC: Recent Labs    04/06/21 0443 04/08/21 0503 04/09/21 0452 04/10/21 0553  WBC 14.6* 17.2* 16.0* 13.3*  HGB 11.0* 11.0* 10.7* 10.4*  HCT 33.2* 34.3* 32.6* 32.3*  PLT 305 325 309 338    COAGS: Recent Labs    04/06/21 1506  INR 1.2    BMP: Recent Labs    04/06/21 0443 04/08/21 0503 04/09/21 0452 04/10/21 0553  NA 139 139 137 133*  K 3.7 4.2 4.3 4.1  CL 107 108 107 104  CO2 22 22 21* 23  GLUCOSE 129* 113* 118* 111*  BUN 24* 30* 26* 23  CALCIUM 8.5* 8.5* 8.4* 8.4*  CREATININE 1.44* 1.46* 1.38* 1.40*  GFRNONAA 51* 50* 54* 53*    LIVER  FUNCTION TESTS: Recent Labs    04/01/21 2255 04/02/21 0559 04/03/21 0600 04/04/21 0311  BILITOT 0.8 0.6 0.4 0.7  AST 42* 32 32 26  ALT 46* 41 42 35  ALKPHOS 75 74 70 71  PROT 6.2* 6.2* 5.7* 5.8*  ALBUMIN 2.2* 2.3* 1.9* 2.1*    Assessment and Plan:  75 y.o male inpatient. History of  HTN, nephrolithiasis, CKD, Found to have a CVA  and complicated UTI in the presence of a right double J stent. Ct shows a 3 cm obstructing stone. Due to dual antiplatelets Patient is not a candidate for a nephrouretectomy . IR placed a right sided nephrostomy tube on 10.14.22.   Drain Location: right nephrostomy tube Size: Fr size:  14 Fr Date of placement: 10.14.22  Currently to: Drain collection device: gravity 24 hour output:  150 ml, 220 ml, 800 ml urine is amber colored  Interval imaging/drain manipulation:  None since placement   Plan:  Record output Q shift. Dressing changes QD or PRN if soiled.     Discharge planning: Patient to stable from IR perspective s/p urology.  further plans per primary Team please call IR with questions or concerns. Patient will be scheduled for nephrostomy tube exchange in 6-8 week unless nephroureteral intervention is pursued in the interim.     Electronically Signed: Alene Mires, NP 04/10/2021, 3:23 PM   I spent a total of 15 Minutes at the patient's bedside AND on the patient's hospital floor or unit, greater than 50% of which was counseling/coordinating care for right sided nephrostomy tube placement

## 2021-04-10 NOTE — Progress Notes (Signed)
PROGRESS NOTE    Yohannes Waibel  YQI:[id number] DOB: March 01, 1946 DOA: 04/01/2021 PCP: Eartha Inch, MD   Chief Complaint  Patient presents with   Emesis   Diarrhea   Weakness   Brief Narrative/Hospital Course:  [name], 75 y.o. male with PMH of  htn, nephrolithiasis,  OSA on CPAP,  CKD3a,depression p/w fatigue, lightheadedness, dysuria, confusion, nausea, vomiting, and loose stools.  He had recent admission 3 wks ago for polypharmacy/accidental overdose.  In the ED, found to have complicated UTI in the setting of right ureteral stent obstruction.  Seen by urology status post percutaneous nephrostomy by IR 10/14 with 3 L of purulent fluid aspirated following PCN placement. Hospital course complicated by altered mental status central vertigo and acute/subacute CVA on 04/05/2021 involving the right PCA territory cortical/subcortical infarct, chronic lacunar infarct in the right basal ganglia and seen by neurology/stroke team. Due to persistent leukocytosis antibiotics switched to Zosyn 10/15  Subjective: Seen and examined this morning. Patient was sleeping but woke up after calling and interactive-mildly confused right after waking. Afebrile overnight.  WBC count downtrending  Nephrostomy tube drain + with clear urine Slept well last night Daughter  at bedside  Assessment & Plan:  Acute/subacute CVA involving right PCA territory, both cerebral hemisphere, right worse than left: MRI brain completed 10/12.  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 Neurology advised to continue aspirin 81, Plavix 75 mg x 3 months followed by aspirin alone, without any break in therapy unless for urgent or emergent issues procedure or surgery.    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  IR Planning for nephrostomy catheter exchange in 6 to 8 weeks.Culture from PCN 10/14-rare Enterobacter species growing-pending C/S. Cont Zosyn -leukocytosis improving, patient is afebrile.Dr. Laverle Patter from urology mentioned plan to reevaluate for operative intervention early next week but will likely need to delay given need for blood thinners due to his stroke.  Family would like to talk with urology re: plan. Recent Labs  Lab 04/05/21 0200 04/06/21 0443 04/08/21 0503 04/09/21 0452 04/10/21 0553  WBC 15.0* 14.6* 17.2* 16.0* 13.3*   CKD stage IIIa: Renal function remains stable at baseline.   [place] metabolic acidosis monitor bicarb. Recent Labs  Lab 04/05/21 0200 04/06/21 0443 04/08/21 0503 04/09/21 0452 04/10/21 0553  BUN 24* 24* 30* 26* 23  CREATININE 1.60* 1.44* 1.46* 1.38* 1.40*    Essential hypertension: At home on Amlodipine, Toprol 50 mg recently increased, telmisartan 80 mg.   BP well controlled on amlodipine 5 mg cont same.  Acute metabolic encephalopathy in the setting of sepsis, stroke likely multifactorial.  Patient is overall doing well but mildly confused and weak deconditioned, recent admission for polypharmacy.  Continue delirium precaution, supportive care.  After discussion resume Prozac, continue melatonin holding off on resuming Seroquel  Ascending aortic aneurysm 4.5 cm-seen in TTE, recommended follow-up CTA.  Continue BP control.  Nausea/vomiting/diarrhea: Diarrhea on Saturday, no BM since then, tolerating po well. Continue supportive care  Transaminitis resolved  Depression: Followed by outpatient psychiatry.  Back  on Prozac but holding Seroquel as per discussion . If needed we can consider low dose w seroquel at night, cont on melatonin  OSA continue CPAP nightly  Class I Obesity:Patient's Body mass index is 31.66 kg/m. : Will benefit with PCP follow-up, weight loss  healthy lifestyle.  Physical deconditioning/debility: Continue PT OT.  Planning for CIR  DVT prophylaxis: enoxaparin (LOVENOX) injection 40 mg Start: 04/02/21 1000 Code Status:   Code Status: Full Code Family Communication: plan of care discussed with patient'S WIFE/DAUGHTER at bedside. Status is: Inpatient  Remains inpatient appropriate because: Of ongoing IV antibiotic needs. Is from Home with family PT OT has recommended  [facility] (which is out of network for rehab here) Anticipated discharge : once bed available in 2-3 days Discussed in MDR meeting.  Objective: Vitals: Today's Vitals   04/09/21 2000 04/10/21 0034 04/10/21 0436 04/10/21 0913  BP: 135/70 123/79 119/73 (!) 121/57  Pulse: 88 80 73 65  Resp: 18 18 17 15   Temp: 99.1 F (37.3 C) 97.7 F (36.5 C) 97.6 F (36.4 C) 97.7 F (36.5 C)  TempSrc: Oral Oral Oral Oral  SpO2: 96% 97% 98% 99%  Weight:      Height:      PainSc: 0-No pain      Physical Examination: General exam: AAOx 2 (but just waking up from sleep) HEENT:Oral mucosa moist, Ear/Nose WNL grossly, dentition normal. Respiratory system: bilaterally clear, no use of accessory muscle Cardiovascular system: S1 & S2 +, No JVD,. Gastrointestinal system: Abdomen soft, NT,ND, BS+ Nervous System:Alert, awake, moving extremities and grossly nonfocal Extremities: No edema, distal peripheral pulses palpable.  Skin: No rashes,no icterus. MSK: Normal muscle bulk,tone, power  Foley+ condom Nephrostomy tube bag+ with clear urine 75 ml.  Medications reviewed:  Scheduled Meds:  amLODipine  5 mg Oral Daily   aspirin  81 mg Oral Daily   clopidogrel  75 mg Oral Daily   enoxaparin (LOVENOX) injection  40 mg Subcutaneous  Q24H   FLUoxetine  40 mg Oral Daily   lactobacillus  1 g Oral BID   melatonin  3 mg Oral QHS   sodium chloride flush  5 mL Intracatheter Q8H   Continuous Infusions:  piperacillin-tazobactam (ZOSYN)  IV 3.375 g (04/10/21 0429)   promethazine (PHENERGAN) injection (IM or IVPB)     Diet Order             DIET DYS 2 Room service appropriate? Yes; Fluid consistency: Thin  Diet effective now                  Intake/Output  Intake/Output Summary (Last 24 hours) at 04/10/2021 0918 Last data filed at 04/10/2021 0433 Gross per 24 hour  Intake 385 ml  Output 800 ml  Net -415 ml   Intake/Output from previous day: 10/16 0701 - 10/17 0700 In: 405 [P.O.:360; I.V.:10] Out: 1200 [Urine:1200] Net IO Since Admission: -3,513.23 mL [04/10/21 0918]   Weight change:   Wt Readings from Last 3 Encounters:  04/03/21 94.4 kg  03/11/21 98.5 kg  02/10/21 104.3 kg     Consultants:see note  Procedures:see note Antimicrobials: Anti-infectives (From admission, onward)    Start     Dose/Rate Route Frequency Ordered Stop   04/08/21 1200  piperacillin-tazobactam (ZOSYN) IVPB 3.375 g        3.375 g 12.5 mL/hr over 240 Minutes Intravenous Every 8 hours 04/08/21 0551     04/08/21 0600  piperacillin-tazobactam (ZOSYN) IVPB 3.375 g  3.375 g 100 mL/hr over 30 Minutes Intravenous  Once 04/08/21 0551 04/08/21 0701   04/03/21 0215  cefTRIAXone (ROCEPHIN) 1 g in sodium chloride 0.9 % 100 mL IVPB  Status:  Discontinued        1 g 200 mL/hr over 30 Minutes Intravenous Every 24 hours 04/02/21 0344 04/08/21 0538   04/02/21 0215  cefTRIAXone (ROCEPHIN) 1 g in sodium chloride 0.9 % 100 mL IVPB        1 g 200 mL/hr over 30 Minutes Intravenous  Once 04/02/21 0213 04/02/21 0251      Culture/Microbiology    Component Value Date/Time   SDES ABSCESS 04/07/2021 1256   SPECREQUEST POST RIGHT SIDED PCN PLACEMENT 04/07/2021 1256   CULT  04/07/2021 1256    RARE ENTEROBACTER SPECIES SUSCEPTIBILITIES TO  FOLLOW NO ANAEROBES ISOLATED; CULTURE IN PROGRESS FOR 5 DAYS    REPTSTATUS PENDING 04/07/2021 1256    Other culture-see note  Unresulted Labs (From admission, onward)     Start     Ordered   04/09/21 0500  Creatinine, serum  (enoxaparin (LOVENOX)    CrCl >/= 30 ml/min)  Weekly,   R     Comments: while on enoxaparin therapy    04/02/21 0344   04/06/21 0500  CBC  Every 48 hours,   R     Question:  Specimen collection method  Answer:  Lab=Lab collect   04/05/21 1848   04/06/21 0500  Basic metabolic panel  Every 48 hours,   R     Question:  Specimen collection method  Answer:  Lab=Lab collect   04/05/21 1848           Data Reviewed: I have personally reviewed following labs and imaging studies CBC: Recent Labs  Lab 04/05/21 0200 04/06/21 0443 04/08/21 0503 04/09/21 0452 04/10/21 0553  WBC 15.0* 14.6* 17.2* 16.0* 13.3*  HGB 10.8* 11.0* 11.0* 10.7* 10.4*  HCT 33.0* 33.2* 34.3* 32.6* 32.3*  MCV 85.9 86.0 88.4 87.9 87.1  PLT 323 305 325 309 338   Basic Metabolic Panel: Recent Labs  Lab 04/04/21 0311 04/05/21 0200 04/06/21 0443 04/08/21 0503 04/09/21 0452 04/10/21 0553  NA 140 140 139 139 137 133*  K 3.4* 3.4* 3.7 4.2 4.3 4.1  CL 107 108 107 108 107 104  CO2 23 21* 22 22 21* 23  GLUCOSE 131* 124* 129* 113* 118* 111*  BUN 29* 24* 24* 30* 26* 23  CREATININE 1.67* 1.60* 1.44* 1.46* 1.38* 1.40*  CALCIUM 8.3* 8.5* 8.5* 8.5* 8.4* 8.4*  MG 1.9  --   --   --  2.2  --   PHOS  --   --   --   --  4.4  --    GFR: Estimated Creatinine Clearance: 52.4 mL/min (A) (by C-G formula based on SCr of 1.4 mg/dL (H)). Liver Function Tests: Recent Labs  Lab 04/04/21 0311  AST 26  ALT 35  ALKPHOS 71  BILITOT 0.7  PROT 5.8*  ALBUMIN 2.1*   No results for input(s): LIPASE, AMYLASE in the last 168 hours. No results for input(s): AMMONIA in the last 168 hours. Coagulation Profile: Recent Labs  Lab 04/06/21 1506  INR 1.2   Cardiac Enzymes: No results for input(s): CKTOTAL,  CKMB, CKMBINDEX, TROPONINI in the last 168 hours. BNP (last 3 results) No results for input(s): PROBNP in the last 8760 hours. HbA1C: No results for input(s): HGBA1C in the last 72 hours. CBG: No results for input(s): GLUCAP in the last 168  hours. Lipid Profile: No results for input(s): CHOL, HDL, LDLCALC, TRIG, CHOLHDL, LDLDIRECT in the last 72 hours. Thyroid Function Tests: No results for input(s): TSH, T4TOTAL, FREET4, T3FREE, THYROIDAB in the last 72 hours. Anemia Panel: No results for input(s): VITAMINB12, FOLATE, FERRITIN, TIBC, IRON, RETICCTPCT in the last 72 hours. Sepsis Labs: No results for input(s): PROCALCITON, LATICACIDVEN in the last 168 hours.  Recent Results (from the past 240 hour(s))  Resp Panel by RT-PCR (Flu A&B, Covid) Nasopharyngeal Swab     Status: None   Collection Time: 04/01/21  8:00 PM   Specimen: Nasopharyngeal Swab; Nasopharyngeal(NP) swabs in vial transport medium  Result Value Ref Range Status   SARS Coronavirus 2 by RT PCR NEGATIVE NEGATIVE Final    Comment: (NOTE) SARS-CoV-2 target nucleic acids are NOT DETECTED.  The SARS-CoV-2 RNA is generally detectable in upper respiratory specimens during the acute phase of infection. The lowest concentration of SARS-CoV-2 viral copies this assay can detect is 138 copies/mL. A negative result does not preclude SARS-Cov-2 infection and should not be used as the sole basis for treatment or other patient management decisions. A negative result may occur with  improper specimen collection/handling, submission of specimen other than nasopharyngeal swab, presence of viral mutation(s) within the areas targeted by this assay, and inadequate number of viral copies(<138 copies/mL). A negative result must be combined with clinical observations, patient history, and epidemiological information. The expected result is Negative.  Fact Sheet for Patients:  BloggerCourse.com  Fact Sheet for  Healthcare Providers:  SeriousBroker.it  This test is no t yet approved or cleared by the [place] FDA and  has been authorized for detection and/or diagnosis of SARS-CoV-2 by FDA under an Emergency Use Authorization (EUA). This EUA will remain  in effect (meaning this test can be used) for the duration of the COVID-19 declaration under Section 564(b)(1) of the Act, 21 U.S.C.section 360bbb-3(b)(1), unless the authorization is terminated  or revoked sooner.       Influenza A by PCR NEGATIVE NEGATIVE Final   Influenza B by PCR NEGATIVE NEGATIVE Final    Comment: (NOTE) The Xpert Xpress SARS-CoV-2/FLU/RSV plus assay is intended as an aid in the diagnosis of influenza from Nasopharyngeal swab specimens and should not be used as a sole basis for treatment. Nasal washings and aspirates are unacceptable for Xpert Xpress SARS-CoV-2/FLU/RSV testing.  Fact Sheet for Patients: BloggerCourse.com  Fact Sheet for Healthcare Providers: SeriousBroker.it  This test is not yet approved or cleared by the [place] FDA and has been authorized for detection and/or diagnosis of SARS-CoV-2 by FDA under an Emergency Use Authorization (EUA). This EUA will remain in effect (meaning this test can be used) for the duration of the COVID-19 declaration under Section 564(b)(1) of the Act, 21 U.S.C. section 360bbb-3(b)(1), unless the authorization is terminated or revoked.  Performed at [facility], [address], [place], [place] [address]   Urine Culture     Status: Abnormal   Collection Time: 04/02/21  2:41 AM   Specimen: Urine, Clean Catch  Result Value Ref Range Status   Specimen Description   Final    URINE, CLEAN CATCH Performed at [facility], [address] [place], [place] [address]    Special Requests   Final    NONE Performed at [facility], [address] [place], [place] [address]    Culture (A)  Final    >=100,000 COLONIES/mL ENTEROBACTER CLOACAE 50,000 COLONIES/mL AEROCOCCUS SPECIES Standardized susceptibility  testing for this organism is not available. Performed at [facility], [address], [place], [place] [address]    Report Status 04/05/2021 FINAL  Final   Organism ID, Bacteria ENTEROBACTER CLOACAE (A)  Final      Susceptibility   Enterobacter cloacae - MIC*    CEFAZOLIN >=64 RESISTANT Resistant     CEFEPIME <=0.12 SENSITIVE Sensitive     CIPROFLOXACIN <=0.25 SENSITIVE Sensitive     GENTAMICIN <=1 SENSITIVE Sensitive     IMIPENEM <=0.25 SENSITIVE Sensitive     NITROFURANTOIN 64 INTERMEDIATE Intermediate     TRIMETH/SULFA >=320 RESISTANT Resistant     PIP/TAZO 8 SENSITIVE Sensitive     * >=100,000 COLONIES/mL ENTEROBACTER CLOACAE  Culture, blood (routine x 2)     Status: None   Collection Time: 04/02/21 11:24 PM   Specimen: BLOOD  Result Value Ref Range Status   Specimen Description   Final    BLOOD BLOOD RIGHT HAND Performed at [facility], [address], [place], [place] [address]    Special Requests   Final    BOTTLES DRAWN AEROBIC AND ANAEROBIC Blood Culture adequate volume Performed at [facility], [address], [place], [place] [address]    Culture   Final    NO GROWTH 5 DAYS Performed at [facility], [address] [place], [place] [address]    Report Status 04/08/2021 FINAL  Final  Culture, blood (routine x 2)     Status: None   Collection Time: 04/02/21 11:26 PM   Specimen: BLOOD  Result Value Ref Range Status   Specimen Description   Final    BLOOD RIGHT ANTECUBITAL Performed at [facility], [address] [place], [place] [address]    Special Requests   Final    BOTTLES DRAWN AEROBIC AND ANAEROBIC Blood Culture adequate volume Performed at [facility], [address], [place], [place]  [address]    Culture   Final    NO GROWTH 5 DAYS Performed at [facility], [address], [place], [place] [address]    Report Status 04/08/2021 FINAL  Final  Aerobic/Anaerobic Culture w Gram Stain (surgical/deep wound)     Status: None (Preliminary result)   Collection Time: 04/07/21 12:56 PM   Specimen: Abscess  Result Value Ref Range Status   Specimen Description ABSCESS  Final   Special Requests POST RIGHT SIDED PCN PLACEMENT  Final   Gram Stain   Final    ABUNDANT WBC PRESENT, PREDOMINANTLY PMN RARE GRAM POSITIVE COCCI Performed at [facility], [address], [place], [place] [address]    Culture   Final    RARE ENTEROBACTER SPECIES SUSCEPTIBILITIES TO FOLLOW NO ANAEROBES ISOLATED; CULTURE IN PROGRESS FOR 5 DAYS    Report Status PENDING  Incomplete     Radiology Studies: No results found.   LOS: 8 days   Lanae Boast, MD [facility]  04/10/2021, 9:18 AM

## 2021-04-10 NOTE — Progress Notes (Signed)
Occupational Therapy Treatment Patient Details Name: Birch Farino MRN: [id number] DOB: 1946-01-28 Today's Date: 04/10/2021   History of present illness The pt is a 75 yo male admitted 10/8 to [facility] with c/o dizziness and weakness x2 weeks. Pt found to have UTI, nephrolithiasis, and obstructed right ureteral stent. PT noted vestibular dysfunction indicating central lesion on 10/11 and pt then found to have R PCA, L cerebellar, and L periventricular infarcts as well as left inferior quadrantanopsia. PMH includes: accidential overdose, CKD III, depression, HTN, and kidney stones.   OT comments  Patient received in bed with family in room. Patient willing to participate in OT treatment and required assistance to get to eob. Patient was able to ambulate to sink to perform groom and to transfer to [facility].  Patient was able to follow commands with extra time to process and cues for safety.  Patient attempted to walk to recliner but was unable to make distance and chair was moved closer.  Patient left in recliner with family present.  Acute OT to continue to follow.    Recommendations for follow up therapy are one component of a multi-disciplinary discharge planning process, led by the attending physician.  Recommendations may be updated based on patient status, additional functional criteria and insurance authorization.    Follow Up Recommendations  CIR    Equipment Recommendations  3 in 1 bedside commode    Recommendations for Other Services      Precautions / Restrictions Precautions Precautions: Fall Precaution Comments: dizzness, nausea Restrictions Weight Bearing Restrictions: No       Mobility Bed Mobility Overal bed mobility: Needs Assistance Bed Mobility: Sit to Supine     Supine to sit: Mod assist     General bed mobility comments: required assistance with trunk to get seated on eob    Transfers Overall transfer level: Needs assistance Equipment used: Rolling walker (2  wheeled) Transfers: Sit to/from [employer] Sit to Stand: Min assist Stand pivot transfers: Min assist       General transfer comment: required assistance to power up and to maneuver RW    Balance Overall balance assessment: Needs assistance;History of Falls Sitting-balance support: Feet supported Sitting balance-Leahy Scale: Fair Sitting balance - Comments: able to maintain sitting balance   Standing balance support: Single extremity supported;During functional activity Standing balance-Leahy Scale: Poor Standing balance comment: stood at sink for grooming with one extremity support                           ADL either performed or assessed with clinical judgement   ADL Overall ADL's : Needs assistance/impaired     Grooming: Wash/dry hands;Wash/dry face;Oral care;Brushing hair;Minimal assistance;Standing;Sitting Grooming Details (indicate cue type and reason): patient performed oral care and washed face and hands standing with min guard for safety and brushed hair seated                 Toilet Transfer: Minimal assistance;BSC Toilet Transfer Details (indicate cue type and reason): performed with RW and BSC Toileting- Clothing Manipulation and Hygiene: Moderate assistance Toileting - Clothing Manipulation Details (indicate cue type and reason): required assistance to complete hygiene     Functional mobility during ADLs: Min guard;Minimal assistance;Rolling walker General ADL Comments: slow processing while performing ADL tasks     Vision       Perception     Praxis      Cognition Arousal/Alertness: Lethargic Behavior During Therapy: Flat affect Overall Cognitive  Status: Difficult to assess Area of Impairment: Orientation;Attention;Following commands;Problem solving                 Orientation Level: Disoriented to;Time Current Attention Level: Selective   Following Commands: Follows multi-step commands with increased  time;Follows multi-step commands inconsistently;Follows one step commands with increased time     Problem Solving: Slow processing;Requires verbal cues;Decreased initiation General Comments: required extra time to respond to commands        Exercises     Shoulder Instructions       General Comments      Pertinent Vitals/ Pain       Pain Assessment: No/denies pain  Home Living                                          Prior Functioning/Environment              Frequency  Min 2X/week        Progress Toward Goals  OT Goals(current goals can now be found in the care plan section)  Progress towards OT goals: Progressing toward goals  Acute Rehab OT Goals Patient Stated Goal: to rest OT Goal Formulation: With patient Time For Goal Achievement: 04/18/21 Potential to Achieve Goals: Good ADL Goals Pt Will Perform Upper Body Bathing: with modified independence;sitting Pt Will Transfer to Toilet: with supervision;bedside commode;ambulating Additional ADL Goal #1: patient to demonstrate use of compesatory strategies to compenate for visual deficits with MI during ADL tasks.  Plan Discharge plan remains appropriate    Co-evaluation                 AM-PAC OT "6 Clicks" Daily Activity     Outcome Measure   Help from another person eating meals?: A Little Help from another person taking care of personal grooming?: A Little Help from another person toileting, which includes using toliet, bedpan, or urinal?: A Lot Help from another person bathing (including washing, rinsing, drying)?: A Lot Help from another person to put on and taking off regular upper body clothing?: A Lot Help from another person to put on and taking off regular lower body clothing?: A Lot 6 Click Score: 14    End of Session Equipment Utilized During Treatment: Rolling walker  OT Visit Diagnosis: Unsteadiness on feet (R26.81);Other symptoms and signs involving cognitive  function;Muscle weakness (generalized) (M62.81);Other abnormalities of gait and mobility (R26.89);History of falling (Z91.81)   Activity Tolerance Patient tolerated treatment well   Patient Left in chair;with call bell/phone within reach;with chair alarm set;with family/visitor present   Nurse Communication Mobility status        Time: 2355-7322 OT Time Calculation (min): 40 min  Charges: OT General Charges $OT Visit: 1 Visit OT Treatments $Self Care/Home Management : 38-52 mins  Alfonse Flavors, OTA Acute Rehabilitation Services  Pager [phone] Office [phone]   Dewain Penning 04/10/2021, 10:51 AM

## 2021-04-10 NOTE — Progress Notes (Signed)
Physical Therapy Treatment Patient Details Name: Nicholas Mckenzie MRN: [id number] DOB: March 02, 1946 Today's Date: 04/10/2021   History of Present Illness The pt is a 75 yo male admitted 10/8 to [facility] with c/o dizziness and weakness x2 weeks. Pt found to have UTI, nephrolithiasis, and obstructed right ureteral stent with percutaneous nephrostomy by IR with PCN placement on 04/07/21. PT noted vestibular dysfunction indicating central lesion on 10/11 and pt then found to have R PCA, L cerebellar, and L periventricular infarcts as well as left inferior quadrantanopsia. PMH includes: accidential overdose, CKD III, depression, HTN, and kidney stones.    PT Comments    Pt making gradual progress.  Increased ambulation distance today and began habituation exercises.  Pt requiring frequent cues for transfer techniques.  Incorporated focus points and segmental turns with mobility to ease dizziness.  Continue to recommend CIR.     Recommendations for follow up therapy are one component of a multi-disciplinary discharge planning process, led by the attending physician.  Recommendations may be updated based on patient status, additional functional criteria and insurance authorization.  Follow Up Recommendations  CIR     Equipment Recommendations  Rolling walker with 5" wheels;Wheelchair cushion (measurements PT);Wheelchair (measurements PT) (further assessment post acute)    Recommendations for Other Services       Precautions / Restrictions Precautions Precautions: Fall Precaution Comments: dizzness, nausea     Mobility  Bed Mobility               General bed mobility comments: in chair    Transfers Overall transfer level: Needs assistance Equipment used: Rolling walker (2 wheeled) Transfers: Sit to/from Stand Sit to Stand: Mod assist;Min assist         General transfer comment: Performed sit to stand x 2; mod A to rise initially and min A 2nd attempt.  Cues for hand placement and for  focus point  Ambulation/Gait Ambulation/Gait assistance: Mod assist Gait Distance (Feet): 8 Feet (8'x2) Assistive device: Rolling walker (2 wheeled) Gait Pattern/deviations: Step-to pattern;Decreased stride length;Trunk flexed Gait velocity: decreased   General Gait Details: Mod A , fatigued easily, ambulated 8'x2 with seated rest break.  Pt requiring mod A for RW management and balance, tends to keep RW too far forward requiring cues and assist to correct.  Also cued for segmental turns and posture   Stairs             Wheelchair Mobility    Modified Rankin (Stroke Patients Only) Modified Rankin (Stroke Patients Only) Pre-Morbid Rankin Score: Slight disability Modified Rankin: Moderately severe disability     Balance Overall balance assessment: Needs assistance;History of Falls Sitting-balance support: Feet supported Sitting balance-Leahy Scale: Fair Sitting balance - Comments: able to maintain sitting balance   Standing balance support: Bilateral upper extremity supported Standing balance-Leahy Scale: Poor Standing balance comment: Requiring RW and assist                            Cognition Arousal/Alertness: Awake/alert Behavior During Therapy: Flat affect Overall Cognitive Status: Impaired/Different from baseline                   Orientation Level: Disoriented to;Situation Current Attention Level: Selective   Following Commands: Follows multi-step commands inconsistently;Follows one step commands consistently     Problem Solving: Slow processing;Requires verbal cues General Comments: required extra time to respond to commands      Exercises Other Exercises Other Exercises: Tracking up/down/L/R 5 x  each direction; gaze stabilzation up/down/L/R 5 x each direction (cued for increased speed as able); head turns up/down/L/R 5 x each direction    General Comments General comments (skin integrity, edema, etc.): VSS on RA.  Pt reports  dizziness is constant and worsens with transfers.  Educated on segmental turns and focus points with transfers.  Also, educated on purpose of habituation exercises and given verbal HEP (daughters present)      Pertinent Vitals/Pain Pain Assessment: No/denies pain    Home Living                      Prior Function            PT Goals (current goals can now be found in the care plan section) Progress towards PT goals: Progressing toward goals    Frequency    Min 4X/week      PT Plan Current plan remains appropriate    Co-evaluation              AM-PAC PT "6 Clicks" Mobility   Outcome Measure  Help needed turning from your back to your side while in a flat bed without using bedrails?: A Little Help needed moving from lying on your back to sitting on the side of a flat bed without using bedrails?: A Lot Help needed moving to and from a bed to a chair (including a wheelchair)?: A Lot Help needed standing up from a chair using your arms (e.g., wheelchair or bedside chair)?: A Lot Help needed to walk in hospital room?: A Lot Help needed climbing 3-5 steps with a railing? : Total 6 Click Score: 12    End of Session Equipment Utilized During Treatment: Gait belt Activity Tolerance: Patient tolerated treatment well Patient left: with chair alarm set;in chair;with call bell/phone within reach;with family/visitor present Nurse Communication: Mobility status PT Visit Diagnosis: Muscle weakness (generalized) (M62.81);Difficulty in walking, not elsewhere classified (R26.2);Ataxic gait (R26.0)     Time: 1350-1419 PT Time Calculation (min) (ACUTE ONLY): 29 min  Charges:  $Gait Training: 8-22 mins $Neuromuscular Re-education: 8-22 mins                     Anise Salvo, PT Acute Rehab Services Pager [phone] [facility] [phone]    Rayetta Humphrey 04/10/2021, 4:14 PM

## 2021-04-10 NOTE — Care Management Important Message (Signed)
Important Message  Patient Details  Name: Nicholas Mckenzie MRN: [id number] Date of Birth: 11/22/1945   Medicare Important Message Given:  Yes     Renie Ora 04/10/2021, 10:38 AM

## 2021-04-10 NOTE — TOC Progression Note (Signed)
Transition of Care (TOC) - Progression Note  Donn Pierini RN, BSN Transitions of Care Unit 4E- RN Case Manager See Treatment Team for direct phone #    Patient Details  Name: Nicholas Mckenzie MRN: [id number] Date of Birth: July 06, 1945  Transition of Care [facility]) CM/SW Contact  Zenda Alpers Lenn Sink, RN Phone Number: 04/10/2021, 3:15 PM  Clinical Narrative:    Follow up done on IPR referrals to [facility] and [facility] rehabs done over the weekend. [facility] rehab has received referral and is to speak with pt and family regarding their program. [facility] states that they did not receive referral- referral re-faxed to Anne at [facility] INPT rehab for review.   Call made to wife Nicholas Mckenzie to discuss INPT rehab options. Per TC conversation wife shares that she desires patient to remain as close to home in [place] as possible. Discussed with Nicholas Mckenzie that [facility] INPT rehab was not in-network with current insurance- next closest rehab would be [facility], and then after that would be the rehabs in [place]. Provided Nicholas Mckenzie with approximate mileage to both [employer] and [employer].  Nicholas Mckenzie expresses that she would like to look at [facility] location as her next preference, as she really is hopeful to stay as close to home as possible- will use [facility] as last resort. Also discussed with Nicholas Mckenzie the average length of stay for INPT rehab. Wife also shares that they have signed pt up for new insurance that will start on Nov. 1, 2022 with [facility].   Call made back to [facility] INPT rehab and spoke with Thurston Hole who confirmed that she had received the refax this am regarding referral- referral is being reviewed- will await return call.    Expected Discharge Plan: IP Rehab Facility Barriers to Discharge: Continued Medical Work up, Conservator, museum/gallery and Services Expected Discharge Plan: IP Rehab Facility In-house Referral: Clinical Social Work   Post Acute Care Choice: Home Health Living arrangements for the  past 2 months: Single Family Home                 DME Arranged: N/A DME Agency: NA       HH Arranged: PT HH Agency: [facility] Date HH Agency Contacted: 04/04/21 Time HH Agency Contacted: 1400 Representative spoke with at [facility] Agency: Pearson Grippe   Social Determinants of Health (SDOH) Interventions    Readmission Risk Interventions No flowsheet data found.

## 2021-04-11 DIAGNOSIS — I10 Essential (primary) hypertension: Secondary | ICD-10-CM | POA: Diagnosis not present

## 2021-04-11 NOTE — Progress Notes (Signed)
Mobility Specialist: Progress Note   04/11/21 1739  Mobility  Activity Ambulated in hall  Level of Assistance Moderate assist, patient does 50-74%  Assistive Device Front wheel walker  Distance Ambulated (ft) 32 ft (16'x2)  Mobility Ambulated with assistance in hallway  Mobility Response Tolerated well  Mobility performed by Mobility specialist  $Mobility charge 1 Mobility   Pre-Mobility: 80 HR, 136/80 BP Post-Mobility: 78 HR  Pt required modA to sit EOB from supine as well as to stand. Pt required one seated break halfway through ambulation d/t general fatigue, otherwise asymptomatic. Pt back to bed after walk with call bell at his side and family member present in the room.   [facility] Mishayla Sliwinski Mobility Specialist Mobility Specialist Phone: [phone]

## 2021-04-11 NOTE — Progress Notes (Signed)
Physical Therapy Treatment Patient Details Name: Nicholas Mckenzie MRN: [id number] DOB: 10-03-1945 Today's Date: 04/11/2021   History of Present Illness The pt is a 75 yo male admitted 10/8 to [facility] with c/o dizziness and weakness x2 weeks. Pt found to have UTI, nephrolithiasis, and obstructed right ureteral stent with percutaneous nephrostomy by IR with PCN placement on 04/07/21. PT noted vestibular dysfunction indicating central lesion on 10/11 and pt then found to have R PCA, L cerebellar, and L periventricular infarcts as well as left inferior quadrantanopsia. PMH includes: accidential overdose, CKD III, depression, HTN, and kidney stones.    PT Comments    Pt continues to make steady progress with mobility. Pt motivated and family supportive. Continue to feel pt will be good inpatient  rehab candidate.   Recommendations for follow up therapy are one component of a multi-disciplinary discharge planning process, led by the attending physician.  Recommendations may be updated based on patient status, additional functional criteria and insurance authorization.  Follow Up Recommendations  CIR     Equipment Recommendations  Rolling walker with 5" wheels;Wheelchair cushion (measurements PT);Wheelchair (measurements PT)    Recommendations for Other Services       Precautions / Restrictions Precautions Precautions: Fall     Mobility  Bed Mobility Overal bed mobility: Needs Assistance Bed Mobility: Supine to Sit     Supine to sit: Mod assist;HOB elevated     General bed mobility comments: Assist to guide LE's off of bed, elevate trunk into sitting and bring hips to EOB.    Transfers Overall transfer level: Needs assistance Equipment used: Rolling walker (2 wheeled) Transfers: Sit to/from [employer] Sit to Stand: Mod assist;Min assist Stand pivot transfers: Min assist;+2 safety/equipment       General transfer comment: Mod assist on initial sit to stand to bring  hips up and for balance. On subsequent stands min assist. Verbal cues for hand placement. Bed to bsc with stand pivot with shuffling steps with walker. Verbal cues to keep visual focus  Ambulation/Gait Ambulation/Gait assistance: Mod assist;+2 safety/equipment Gait Distance (Feet): 20 Feet (4' x 1, 20' x 1, 10' x 1) Assistive device: Rolling walker (2 wheeled) Gait Pattern/deviations: Step-to pattern;Decreased step length - right;Decreased step length - left;Decreased stride length;Ataxic;Trunk flexed;Wide base of support;Shuffle Gait velocity: decreased Gait velocity interpretation: <1.31 ft/sec, indicative of household ambulator General Gait Details: Assist for balance and support. Verbal/tactile cues for hand placement on walker as pt tends to place hands too far forward on walker. Pt with difficulty with gait sequence and with advancing LLE. By using cues "left right left right" pt with improved gait.   Stairs             Wheelchair Mobility    Modified Rankin (Stroke Patients Only) Modified Rankin (Stroke Patients Only) Pre-Morbid Rankin Score: Slight disability Modified Rankin: Moderately severe disability     Balance Overall balance assessment: Needs assistance;History of Falls Sitting-balance support: Feet supported;Bilateral upper extremity supported Sitting balance-Leahy Scale: Poor Sitting balance - Comments: Pt with UE support   Standing balance support: Bilateral upper extremity supported Standing balance-Leahy Scale: Poor Standing balance comment: Walker and min assist for static standing                            Cognition Arousal/Alertness: Awake/alert Behavior During Therapy: WFL for tasks assessed/performed Overall Cognitive Status: Impaired/Different from baseline Area of Impairment: Attention;Following commands;Problem solving  Current Attention Level: Selective   Following Commands: Follows one step commands  consistently;Follows multi-step commands inconsistently     Problem Solving: Slow processing;Requires verbal cues;Requires tactile cues;Difficulty sequencing        Exercises      General Comments General comments (skin integrity, edema, etc.): VSS on RA.      Pertinent Vitals/Pain Pain Assessment: No/denies pain    Home Living                      Prior Function            PT Goals (current goals can now be found in the care plan section) Progress towards PT goals: Progressing toward goals    Frequency    Min 4X/week      PT Plan Current plan remains appropriate    Co-evaluation              AM-PAC PT "6 Clicks" Mobility   Outcome Measure  Help needed turning from your back to your side while in a flat bed without using bedrails?: A Little Help needed moving from lying on your back to sitting on the side of a flat bed without using bedrails?: A Lot Help needed moving to and from a bed to a chair (including a wheelchair)?: A Lot Help needed standing up from a chair using your arms (e.g., wheelchair or bedside chair)?: A Lot Help needed to walk in hospital room?: A Lot Help needed climbing 3-5 steps with a railing? : Total 6 Click Score: 12    End of Session Equipment Utilized During Treatment: Gait belt Activity Tolerance: Patient tolerated treatment well Patient left: in chair;with call bell/phone within reach;with family/visitor present (chair alarm box not functioning. Nurse tech placed pt on different box.) Nurse Communication: Mobility status PT Visit Diagnosis: Muscle weakness (generalized) (M62.81);Difficulty in walking, not elsewhere classified (R26.2);Ataxic gait (R26.0)     Time: 4268-3419 PT Time Calculation (min) (ACUTE ONLY): 47 min  Charges:  $Gait Training: 38-52 mins                     [facility] PT Acute Rehabilitation Services Pager [phone] Office [phone]    Angelina Ok [facility] 04/11/2021, 2:35 PM

## 2021-04-11 NOTE — TOC Progression Note (Signed)
Transition of Care (TOC) - Progression Note  Donn Pierini RN, BSN Transitions of Care Unit 4E- RN Case Manager See Treatment Team for direct phone #    Patient Details  Name: Nicholas Mckenzie MRN: [id number] Date of Birth: 06-Jul-1945  Transition of Care [facility]) CM/SW Contact  Zenda Alpers, Lenn Sink, RN Phone Number: 04/11/2021, 10:01 AM  Clinical Narrative:    Received call back from [facility] INPT rehab- they feel pt is a good candidate for INPT rehab and have opened case with insurance to seek authorization for INPT stay. Per [employer] has requested updated PT notes- which these were faxed over this AM as per request.  Will await word regarding insurance auth and bed availability.   Call made to wife- Nicholas Mckenzie to update on [facility] INPT rehab.    Expected Discharge Plan: IP Rehab Facility Barriers to Discharge: Continued Medical Work up, Conservator, museum/gallery and Services Expected Discharge Plan: IP Rehab Facility In-house Referral: Clinical Social Work   Post Acute Care Choice: Home Health Living arrangements for the past 2 months: Single Family Home                 DME Arranged: N/A DME Agency: NA       HH Arranged: PT HH [facility] Date HH Agency Contacted: 04/04/21 Time HH Agency Contacted: 1400 Representative spoke with at [facility] Agency: Pearson Grippe   Social Determinants of Health (SDOH) Interventions    Readmission Risk Interventions No flowsheet data found.

## 2021-04-11 NOTE — Progress Notes (Signed)
PROGRESS NOTE    Nicholas Mckenzie  JGO:[id number] DOB: [birth year] DOA: 04/01/2021 PCP: Eartha Inch, MD   Chief Complaint  Patient presents with   Emesis   Diarrhea   Weakness   Brief Narrative/Hospital Course:  [name], 75 y.o. male with PMH of  htn, nephrolithiasis,  OSA on CPAP,  CKD3a,depression p/w fatigue, lightheadedness, dysuria, confusion, nausea, vomiting, and loose stools.  He had recent admission 3 wks ago for polypharmacy/accidental overdose.  In the ED, found to have complicated UTI in the setting of right ureteral stent obstruction.  Seen by urology status post percutaneous nephrostomy by IR 10/14 with 3 L of purulent fluid aspirated following PCN placement. Hospital course complicated by altered mental status central vertigo and acute/subacute CVA on 04/05/2021 involving the right PCA territory cortical/subcortical infarct, chronic lacunar infarct in the right basal ganglia and seen by neurology/stroke team. Due to persistent leukocytosis antibiotics switched to Zosyn 10/15  Subjective: Seen and examined this morning. Slept well. Nephrotomy draining - small amount. He is aaox3, daughter at bedside Wants to eat real food and wants to shower.  Assessment & Plan:  Acute/subacute CVA involving right PCA territory, both cerebral hemisphere, right worse than left: MRI brain completed 10/12.  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 Neurology advised to continue aspirin 81, Plavix 75 mg x 3 months followed by aspirin alone, without any break in therapy unless for urgent or emergent issues procedure or surgery.    Dysphagia on dysphagia diet, speech eval requested as patient wants to eat more solid food.  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  IR Planning for nephrostomy catheter exchange in 6 to 8 weeks.Culture from PCN 10/14-rare Enterobacter species growing-pending C/S. Cont Zosyn -leukocytosis improving, patient is afebrile.Dr. Laverle Patter from urology mentioned plan to reevaluate for operative intervention early next week but will likely need to delay given need for blood thinners due to his stroke.  Family would like to talk with urology re: plan-I will send a message. Recent Labs  Lab 04/05/21 0200 04/06/21 0443 04/08/21 0503 04/09/21 0452 04/10/21 0553  WBC 15.0* 14.6* 17.2* 16.0* 13.3*    CKD stage IIIa: Renal function remains stable at baseline.  Repeat labs in a.m. [place] metabolic acidosis monitor bicarb. Recent Labs  Lab 04/05/21 0200 04/06/21 0443 04/08/21 0503 04/09/21 0452 04/10/21 0553  BUN 24* 24* 30* 26* 23  CREATININE 1.60* 1.44* 1.46* 1.38* 1.40*     Essential hypertension: At home on Amlodipine, Toprol 50 mg recently increased, telmisartan 80 mg.  Here well-controlled on amlodipine 5 mg cont same.  Acute metabolic encephalopathy in the setting of sepsis, stroke likely multifactorial.  Patient is overall doing well-at times slow to respond but is alert awake oriented x3.  He remains weak deconditioned, recent admission for polypharmacy.  Continue delirium precaution, supportive care.  He is back on his Prozac, continue melatonin holding off on resuming Seroquel  Ascending aortic aneurysm 4.5 cm-seen in TTE, recommended follow-up CTA.  Continue BP control.  Nausea/vomiting/diarrhea: Diarrhea on Saturday,  had BM yesterday and Soft.   Transaminitis resolved  Depression:  Followed by outpatient psychiatry.  Back on Prozac but holding Seroquel as per discussion. If needed we can consider low dose w seroquel at night-but he is doing well without it.  Continue on melatonin  OSA continue CPAP nightly  Class I Obesity:Patient's Body mass index is 31.66 kg/m. : Will benefit with PCP follow-up, weight loss  healthy lifestyle.  Physical deconditioning/debility: Remains physically weak deconditioned.  Will benefit with rehabilitation.  Continue PT OT.  Planning for CIR  DVT prophylaxis: enoxaparin (LOVENOX) injection 40 mg Start: 04/02/21 1000 Code Status:   Code Status: Full Code Family Communication: plan of care discussed with patient'S WIFE/DAUGHTER at bedside. Status is: Inpatient  Remains inpatient appropriate because: Of ongoing IV antibiotic needs. Is from Home with family PT OT has recommended  [facility] (which is out of network for rehab here) Anticipated discharge : once bed available and cleared by urology. Discussed in MDR meeting.  Objective: Vitals: Today's Vitals   04/10/21 2107 04/10/21 2310 04/11/21 0413 04/11/21 0743  BP: 130/74 132/75 135/75 (!) 142/77  Pulse: 75 80 70 77  Resp: 19 20 18 17   Temp: 99.3 F (37.4 C) 99 F (37.2 C) 99 F (37.2 C) 98.1 F (36.7 C)  TempSrc: Oral Oral Oral Oral  SpO2:  100% 100% 96%  Weight:      Height:      PainSc: 0-No pain 0-No pain     Physical Examination: General exam: AAOx 3, pleasant weak appearing. HEENT:Oral mucosa moist, Ear/Nose WNL grossly, dentition normal. Respiratory system: bilaterally diminished,no use of accessory muscle Cardiovascular system: S1 & S2 +, No JVD,. Gastrointestinal system: Abdomen soft, NT,ND, BS+ Nervous System:Alert, awake, moving extremities and grossly nonfocal Extremities: No edema, distal peripheral pulses palpable.  Skin: No rashes,no icterus. MSK: Normal muscle bulk,tone, power  Condom catheter in place, nephrostomy tube with minimal urine clear  Medications  reviewed:  Scheduled Meds:  amLODipine  5 mg Oral Daily   aspirin  81 mg Oral Daily   clopidogrel  75 mg Oral Daily   enoxaparin (LOVENOX) injection  40 mg Subcutaneous Q24H   FLUoxetine  40 mg Oral Daily   lactobacillus  1 g Oral BID   melatonin  3 mg Oral QHS   sodium chloride flush  5 mL Intracatheter Q8H   Continuous Infusions:  sodium chloride Stopped (04/10/21 2056)   piperacillin-tazobactam (ZOSYN)  IV 3.375 g (04/11/21 0442)   promethazine (PHENERGAN) injection (IM or IVPB)     Diet Order             DIET DYS 2 Room service appropriate? Yes; Fluid consistency: Thin  Diet effective now                  Intake/Output  Intake/Output Summary (Last 24 hours) at 04/11/2021 0900 Last data filed at 04/10/2021 2315 Gross per 24 hour  Intake 2028.16 ml  Output 1200 ml  Net 828.16 ml    Intake/Output from previous day: 10/17 0701 - 10/18 0700 In: 3468.2 [P.O.:3120; I.V.:6; IV Piggyback:342.1] Out: 1200 [Urine:1200] Net IO Since Admission: -1,245.07 mL [04/11/21 0900]   Weight change:   Wt Readings from Last 3 Encounters:  04/03/21 94.4 kg  03/11/21 98.5 kg  02/10/21 104.3 kg     Consultants:see note  Procedures:see note Antimicrobials: Anti-infectives (From admission, onward)    Start     Dose/Rate Route Frequency Ordered Stop   04/08/21 1200  piperacillin-tazobactam (ZOSYN)  IVPB 3.375 g        3.375 g 12.5 mL/hr over 240 Minutes Intravenous Every 8 hours 04/08/21 0551     04/08/21 0600  piperacillin-tazobactam (ZOSYN) IVPB 3.375 g        3.375 g 100 mL/hr over 30 Minutes Intravenous  Once 04/08/21 0551 04/08/21 0701   04/03/21 0215  cefTRIAXone (ROCEPHIN) 1 g in sodium chloride 0.9 % 100 mL IVPB  Status:  Discontinued        1 g 200 mL/hr over 30 Minutes Intravenous Every 24 hours 04/02/21 0344 04/08/21 0538   04/02/21 0215  cefTRIAXone (ROCEPHIN) 1 g in sodium chloride 0.9 % 100 mL IVPB        1 g 200 mL/hr over 30 Minutes Intravenous  Once 04/02/21  0213 04/02/21 0251      Culture/Microbiology    Component Value Date/Time   SDES ABSCESS 04/07/2021 1256   SPECREQUEST POST RIGHT SIDED PCN PLACEMENT 04/07/2021 1256   CULT  04/07/2021 1256    RARE ENTEROBACTER CLOACAE NO ANAEROBES ISOLATED; CULTURE IN PROGRESS FOR 5 DAYS    REPTSTATUS PENDING 04/07/2021 1256    Other culture-see note  Unresulted Labs (From admission, onward)     Start     Ordered   04/09/21 0500  Creatinine, serum  (enoxaparin (LOVENOX)    CrCl >/= 30 ml/min)  Weekly,   R     Comments: while on enoxaparin therapy    04/02/21 0344   04/06/21 0500  CBC  Every 48 hours,   R     Question:  Specimen collection method  Answer:  Lab=Lab collect   04/05/21 1848   04/06/21 0500  Basic metabolic panel  Every 48 hours,   R     Question:  Specimen collection method  Answer:  Lab=Lab collect   04/05/21 1848           Data Reviewed: I have personally reviewed following labs and imaging studies CBC: Recent Labs  Lab 04/05/21 0200 04/06/21 0443 04/08/21 0503 04/09/21 0452 04/10/21 0553  WBC 15.0* 14.6* 17.2* 16.0* 13.3*  HGB 10.8* 11.0* 11.0* 10.7* 10.4*  HCT 33.0* 33.2* 34.3* 32.6* 32.3*  MCV 85.9 86.0 88.4 87.9 87.1  PLT 323 305 325 309 338    Basic Metabolic Panel: Recent Labs  Lab 04/05/21 0200 04/06/21 0443 04/08/21 0503 04/09/21 0452 04/10/21 0553  NA 140 139 139 137 133*  K 3.4* 3.7 4.2 4.3 4.1  CL 108 107 108 107 104  CO2 21* 22 22 21* 23  GLUCOSE 124* 129* 113* 118* 111*  BUN 24* 24* 30* 26* 23  CREATININE 1.60* 1.44* 1.46* 1.38* 1.40*  CALCIUM 8.5* 8.5* 8.5* 8.4* 8.4*  MG  --   --   --  2.2  --   PHOS  --   --   --  4.4  --     GFR: Estimated Creatinine Clearance: 52.4 mL/min (A) (by C-G formula based on SCr of 1.4 mg/dL (H)). Liver Function Tests: No results for input(s): AST, ALT, ALKPHOS, BILITOT, PROT, ALBUMIN in the last 168 hours.  No results for input(s): LIPASE, AMYLASE in the last 168 hours. No results for input(s):  AMMONIA in the last 168 hours. Coagulation Profile: Recent Labs  Lab 04/06/21 1506  INR 1.2    Cardiac Enzymes: No results for input(s): CKTOTAL, CKMB, CKMBINDEX, TROPONINI in the last 168 hours. BNP (last 3 results) No results for input(s): PROBNP in the last 8760 hours. HbA1C: No results  for input(s): HGBA1C in the last 72 hours. CBG: No results for input(s): GLUCAP in the last 168 hours. Lipid Profile: No results for input(s): CHOL, HDL, LDLCALC, TRIG, CHOLHDL, LDLDIRECT in the last 72 hours. Thyroid Function Tests: No results for input(s): TSH, T4TOTAL, FREET4, T3FREE, THYROIDAB in the last 72 hours. Anemia Panel: No results for input(s): VITAMINB12, FOLATE, FERRITIN, TIBC, IRON, RETICCTPCT in the last 72 hours. Sepsis Labs: No results for input(s): PROCALCITON, LATICACIDVEN in the last 168 hours.  Recent Results (from the past 240 hour(s))  Resp Panel by RT-PCR (Flu A&B, Covid) Nasopharyngeal Swab     Status: None   Collection Time: 04/01/21  8:00 PM   Specimen: Nasopharyngeal Swab; Nasopharyngeal(NP) swabs in vial transport medium  Result Value Ref Range Status   SARS Coronavirus 2 by RT PCR NEGATIVE NEGATIVE Final    Comment: (NOTE) SARS-CoV-2 target nucleic acids are NOT DETECTED.  The SARS-CoV-2 RNA is generally detectable in upper respiratory specimens during the acute phase of infection. The lowest concentration of SARS-CoV-2 viral copies this assay can detect is 138 copies/mL. A negative result does not preclude SARS-Cov-2 infection and should not be used as the sole basis for treatment or other patient management decisions. A negative result may occur with  improper specimen collection/handling, submission of specimen other than nasopharyngeal swab, presence of viral mutation(s) within the areas targeted by this assay, and inadequate number of viral copies(<138 copies/mL). A negative result must be combined with clinical observations, patient history, and  epidemiological information. The expected result is Negative.  Fact Sheet for Patients:  BloggerCourse.com  Fact Sheet for Healthcare Providers:  SeriousBroker.it  This test is no t yet approved or cleared by the [place] FDA and  has been authorized for detection and/or diagnosis of SARS-CoV-2 by FDA under an Emergency Use Authorization (EUA). This EUA will remain  in effect (meaning this test can be used) for the duration of the COVID-19 declaration under Section 564(b)(1) of the Act, 21 U.S.C.section 360bbb-3(b)(1), unless the authorization is terminated  or revoked sooner.       Influenza A by PCR NEGATIVE NEGATIVE Final   Influenza B by PCR NEGATIVE NEGATIVE Final    Comment: (NOTE) The Xpert Xpress SARS-CoV-2/FLU/RSV plus assay is intended as an aid in the diagnosis of influenza from Nasopharyngeal swab specimens and should not be used as a sole basis for treatment. Nasal washings and aspirates are unacceptable for Xpert Xpress SARS-CoV-2/FLU/RSV testing.  Fact Sheet for Patients: BloggerCourse.com  Fact Sheet for Healthcare Providers: SeriousBroker.it  This test is not yet approved or cleared by the [place] FDA and has been authorized for detection and/or diagnosis of SARS-CoV-2 by FDA under an Emergency Use Authorization (EUA). This EUA will remain in effect (meaning this test can be used) for the duration of the COVID-19 declaration under Section 564(b)(1) of the Act, 21 U.S.C. section 360bbb-3(b)(1), unless the authorization is terminated or revoked.  Performed at [facility], [address] [place], [place] [address]   Urine Culture     Status: Abnormal   Collection Time: 04/02/21  2:41 AM   Specimen: Urine, Clean Catch  Result Value Ref Range Status   Specimen Description   Final    URINE, CLEAN CATCH Performed at [facility], [address] [place], [place] [address]    Special Requests   Final    NONE Performed at [facility], [address] [place], [place] [address]  Culture (A)  Final    >=100,000 COLONIES/mL ENTEROBACTER CLOACAE 50,000 COLONIES/mL AEROCOCCUS SPECIES Standardized susceptibility testing for this organism is not available. Performed at [facility], [address], [place], [place] [address]    Report Status 04/05/2021 FINAL  Final   Organism ID, Bacteria ENTEROBACTER CLOACAE (A)  Final      Susceptibility   Enterobacter cloacae - MIC*    CEFAZOLIN >=64 RESISTANT Resistant     CEFEPIME <=0.12 SENSITIVE Sensitive     CIPROFLOXACIN <=0.25 SENSITIVE Sensitive     GENTAMICIN <=1 SENSITIVE Sensitive     IMIPENEM <=0.25 SENSITIVE Sensitive     NITROFURANTOIN 64 INTERMEDIATE Intermediate     TRIMETH/SULFA >=320 RESISTANT Resistant     PIP/TAZO 8 SENSITIVE Sensitive     * >=100,000 COLONIES/mL ENTEROBACTER CLOACAE  Culture, blood (routine x 2)     Status: None   Collection Time: 04/02/21 11:24 PM   Specimen: BLOOD  Result Value Ref Range Status   Specimen Description   Final    BLOOD BLOOD RIGHT HAND Performed at [facility], [address], [place], [place] [address]    Special Requests   Final    BOTTLES DRAWN AEROBIC AND ANAEROBIC Blood Culture adequate volume Performed at [facility], [address] [place], [place] [address]    Culture   Final    NO GROWTH 5 DAYS Performed at [facility], [address], [place], [place] [address]    Report Status 04/08/2021 FINAL  Final  Culture, blood (routine x 2)     Status: None   Collection Time: 04/02/21 11:26 PM   Specimen: BLOOD  Result Value Ref Range Status   Specimen Description   Final    BLOOD RIGHT ANTECUBITAL Performed at [facility], [address] [place], [place] [address]    Special Requests   Final     BOTTLES DRAWN AEROBIC AND ANAEROBIC Blood Culture adequate volume Performed at [facility], [address], [place], [place] [address]    Culture   Final    NO GROWTH 5 DAYS Performed at [facility], [address], [place], [place] [address]    Report Status 04/08/2021 FINAL  Final  Aerobic/Anaerobic Culture w Gram Stain (surgical/deep wound)     Status: None (Preliminary result)   Collection Time: 04/07/21 12:56 PM   Specimen: Abscess  Result Value Ref Range Status   Specimen Description ABSCESS  Final   Special Requests POST RIGHT SIDED PCN PLACEMENT  Final   Gram Stain   Final    ABUNDANT WBC PRESENT, PREDOMINANTLY PMN RARE GRAM POSITIVE COCCI Performed at [facility], [address] [place], [place] [address]    Culture   Final    RARE ENTEROBACTER CLOACAE NO ANAEROBES ISOLATED; CULTURE IN PROGRESS FOR 5 DAYS    Report Status PENDING  Incomplete   Organism ID, Bacteria ENTEROBACTER CLOACAE  Final      Susceptibility   Enterobacter cloacae - MIC*    CEFAZOLIN >=64 RESISTANT Resistant     CEFEPIME <=0.12 SENSITIVE Sensitive     CEFTAZIDIME <=1 SENSITIVE Sensitive     CIPROFLOXACIN <=0.25 SENSITIVE Sensitive     GENTAMICIN <=1 SENSITIVE Sensitive     IMIPENEM <=0.25 SENSITIVE Sensitive     TRIMETH/SULFA >=320 RESISTANT Resistant     PIP/TAZO <=4 SENSITIVE Sensitive     * RARE ENTEROBACTER CLOACAE  Radiology Studies: No results found.   LOS: 9 days   Lanae Boast, MD [facility]  04/11/2021, 9:00 AM

## 2021-04-12 DIAGNOSIS — G9340 Encephalopathy, unspecified: Secondary | ICD-10-CM | POA: Diagnosis not present

## 2021-04-12 LAB — AEROBIC/ANAEROBIC CULTURE W GRAM STAIN (SURGICAL/DEEP WOUND)

## 2021-04-12 LAB — CBC
HCT: 33.3 % — ABNORMAL LOW (ref 39.0–52.0)
Hemoglobin: 10.4 g/dL — ABNORMAL LOW (ref 13.0–17.0)
MCH: 27.8 pg (ref 26.0–34.0)
MCHC: 31.2 g/dL (ref 30.0–36.0)
MCV: 89 fL (ref 80.0–100.0)
Platelets: 391 10*3/uL (ref 150–400)
RBC: 3.74 MIL/uL — ABNORMAL LOW (ref 4.22–5.81)
RDW: 15.3 % (ref 11.5–15.5)
WBC: 11.2 10*3/uL — ABNORMAL HIGH (ref 4.0–10.5)
nRBC: 0 % (ref 0.0–0.2)

## 2021-04-12 LAB — BASIC METABOLIC PANEL
Anion gap: 7 (ref 5–15)
BUN: 24 mg/dL — ABNORMAL HIGH (ref 8–23)
CO2: 24 mmol/L (ref 22–32)
Calcium: 8.8 mg/dL — ABNORMAL LOW (ref 8.9–10.3)
Chloride: 106 mmol/L (ref 98–111)
Creatinine, Ser: 1.36 mg/dL — ABNORMAL HIGH (ref 0.61–1.24)
GFR, Estimated: 55 mL/min — ABNORMAL LOW (ref >60)
Glucose, Bld: 102 mg/dL — ABNORMAL HIGH (ref 70–99)
Potassium: 4 mmol/L (ref 3.5–5.1)
Sodium: 137 mmol/L (ref 135–145)

## 2021-04-12 LAB — SARS CORONAVIRUS 2 (TAT 6-24 HRS): SARS Coronavirus 2: NEGATIVE

## 2021-04-12 LAB — MAGNESIUM: Magnesium: 2.3 mg/dL (ref 1.7–2.4)

## 2021-04-12 MED ORDER — METOPROLOL TARTRATE 12.5 MG HALF TABLET: 12.5000 mg | ORAL_TABLET | Freq: Two times a day (BID) | ORAL | Status: DC

## 2021-04-12 NOTE — Progress Notes (Signed)
PROGRESS NOTE    Nicholas Mckenzie  PVX:[id number] DOB: [birth year] DOA: 04/01/2021 PCP: Nicholas Inch, MD   Chief Complaint  Patient presents with   Emesis   Diarrhea   Weakness   Brief Narrative/Hospital Course:  Nicholas Mckenzie, 75 y.o. male with PMH of  htn, nephrolithiasis,  OSA on CPAP,  CKD3a,depression p/w fatigue, lightheadedness, dysuria, confusion, nausea, vomiting, and loose stools.  He had recent admission 3 wks ago for polypharmacy/accidental overdose.  In the ED, found to have complicated UTI in the setting of right ureteral stent obstruction.  Seen by urology status post percutaneous nephrostomy by IR 10/14 with 3 L of purulent fluid aspirated following PCN placement. Hospital course complicated by altered mental status central vertigo and acute/subacute CVA on 04/05/2021 involving the right PCA territory cortical/subcortical infarct, chronic lacunar infarct in the right basal ganglia and seen by neurology/stroke team.Due to persistent leukocytosis antibiotics switched to Zosyn 10/15-WBC counts are improving.  Afebrile mental status improved.  Remains deconditioned and weak PT OT continues to recommend CIR  Subjective: Patient is afebrile overnight, saturating on room air labs this morning with a stable renal function WBC 11.2.Resting,  wife at bedside no complaints No new complaints. Assessment & Plan:  Acute/subacute CVA involving right PCA territory, both cerebral hemisphere, right worse than left: MRI brain completed 10/12.  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 Neurology advised to continue aspirin 81, Plavix 75 mg x 3 months followed by aspirin alone, without any break in therapy unless for urgent or emergent issues procedure or surgery.    Dysphagia on dysphagia diet, speech eval  requested 10/18 as patient wants to eat more solid food.  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 IR Planning for nephrostomy catheter exchange in 6 to 8 weeks.Culture from PCN 10/14-rare Enterobacter cloacae-  (S- Cipro/zosyn/Cefepime). On  Zosyn while here-responding well leukocytosis almost normal.Dr. Laverle Mckenzie from urology mentioned plan to reevaluate for operative intervention early next week but will likely need to delay given need for blood thinners due to his stroke.  Family would like to talk to him-I will send a message. Recent Labs  Lab 04/06/21 0443 04/08/21 0503 04/09/21 0452 04/10/21 0553 04/12/21 0459  WBC 14.6* 17.2* 16.0* 13.3* 11.2*    CKD stage IIIa: Stable renal function.  Monitor labs intermittently [place] metabolic acidosis- resolved. Recent Labs  Lab 04/06/21 0443 04/08/21 0503 04/09/21 0452 04/10/21 0553 04/12/21 0459  BUN 24* 30* 26* 23 24*  CREATININE 1.44* 1.46* 1.38* 1.40* 1.36*     Essential hypertension: Well-controlled on amlodipine 5 mg.  Continue to hold home telmisartan and metoprolol.  Acute metabolic encephalopathy in the setting of sepsis, stroke likely multifactorial.  Mental status is improved.  At times slow to respond but communicative. He remains weak deconditioned, recent admission for polypharmacy.  Continue delirium precaution, supportive care.  He is back on his Prozac, continue melatonin holding off on resuming Seroquel  Ascending aortic aneurysm 4.5 cm-seen in TTE, recommended follow-up CTA.  Continue BP  control.  Nausea/vomiting/diarrhea: Resolved continue  Transaminitis resolved  Depression: Followed by outpatient psychiatry.  Continue Home Prozac but holding Seroquel after discussion with patient's wife. Continue on melatonin  OSA continue CPAP nightly  Class I Obesity:Patient's Body mass index is 28.89 kg/m. : Will benefit with PCP follow-up, weight loss  healthy lifestyle.  Physical deconditioning/debility: Remains physically weak deconditioned.  Will benefit with rehabilitation -CIR awaiting placement.  DVT prophylaxis: enoxaparin (LOVENOX) injection 40 mg Start: 04/02/21 1000 Code Status:   Code Status: Full Code Family Communication: plan of care discussed with patient'S WIFE/DAUGHTER at bedside. Status is: Inpatient  Remains inpatient appropriate because: Deconditioning, need for inpatient rehab. Is from Home with family PT OT has recommended  CIR (which is out of network for rehab here) Anticipated discharge : once  CIR bed available  Objective: Vitals: Today's Vitals   04/11/21 2022 04/11/21 2314 04/12/21 0329 04/12/21 0351  BP: 137/81 135/81    Pulse: 80 79 68   Resp: 20 15    Temp: 98.3 F (36.8 C) 98 F (36.7 C) 97.9 F (36.6 C)   TempSrc: Oral Oral Oral   SpO2: 97% 98%    Weight:    86.2 kg  Height:      PainSc:       Physical Examination: General exam: AAOx 3,pleasant,older than stated age, weak appearing. HEENT:Oral mucosa moist, Ear/Nose WNL grossly, dentition normal. Respiratory system: bilaterally diminished, no use of accessory muscle Cardiovascular system: S1 & S2 +, No JVD,. Gastrointestinal system: Abdomen soft, NT,ND, BS+ Nervous System:Alert, awake, moving extremities and grossly nonfocal Extremities: No edema, distal peripheral pulses palpable.  Skin: No rashes,no icterus. MSK: Normal muscle bulk,tone, power  Condom catheter in place, nephrostomy tube in place +  Medications reviewed:  Scheduled Meds:  amLODipine  5 mg Oral Daily    aspirin  81 mg Oral Daily   clopidogrel  75 mg Oral Daily   enoxaparin (LOVENOX) injection  40 mg Subcutaneous Q24H   FLUoxetine  40 mg Oral Daily   lactobacillus  1 g Oral BID   melatonin  3 mg Oral QHS   sodium chloride flush  5 mL Intracatheter Q8H   Continuous Infusions:  sodium chloride Stopped (04/10/21 2056)   piperacillin-tazobactam (ZOSYN)  IV 3.375 g (04/12/21 0336)   promethazine (PHENERGAN) injection (IM or IVPB)     Diet Order             DIET DYS 2 Room service appropriate? Yes; Fluid consistency: Thin  Diet effective now                  Intake/Output  Intake/Output Summary (Last 24 hours) at 04/12/2021 0745 Last data filed at 04/12/2021 0552 Gross per 24 hour  Intake 55 ml  Output 1450 ml  Net -1395 ml    Intake/Output from previous day: 10/18 0701 - 10/19 0700 In: 55 [P.O.:50] Out: 1450 [Urine:1450] Net IO Since Admission: -2,640.07 mL [04/12/21 0745]   Weight change:   Wt Readings from Last 3 Encounters:  04/12/21 86.2 kg  03/11/21 98.5 kg  02/10/21 104.3 kg     Consultants:see note  Procedures:see note Antimicrobials: Anti-infectives (From admission, onward)    Start     Dose/Rate Route Frequency Ordered Stop   04/08/21 1200  piperacillin-tazobactam (ZOSYN) IVPB 3.375 g        3.375 g 12.5 mL/hr over 240 Minutes Intravenous Every 8 hours 04/08/21 0551     04/08/21 0600  piperacillin-tazobactam (ZOSYN) IVPB 3.375 g  3.375 g 100 mL/hr over 30 Minutes Intravenous  Once 04/08/21 0551 04/08/21 0701   04/03/21 0215  cefTRIAXone (ROCEPHIN) 1 g in sodium chloride 0.9 % 100 mL IVPB  Status:  Discontinued        1 g 200 mL/hr over 30 Minutes Intravenous Every 24 hours 04/02/21 0344 04/08/21 0538   04/02/21 0215  cefTRIAXone (ROCEPHIN) 1 g in sodium chloride 0.9 % 100 mL IVPB        1 g 200 mL/hr over 30 Minutes Intravenous  Once 04/02/21 0213 04/02/21 0251      Culture/Microbiology    Component Value Date/Time   SDES ABSCESS  04/07/2021 1256   SPECREQUEST POST RIGHT SIDED PCN PLACEMENT 04/07/2021 1256   CULT  04/07/2021 1256    RARE ENTEROBACTER CLOACAE NO ANAEROBES ISOLATED; CULTURE IN PROGRESS FOR 5 DAYS    REPTSTATUS PENDING 04/07/2021 1256     Enterobacter cloacae    MIC    CEFAZOLIN >=64 RESIST... Resistant    CEFEPIME <=0.12 SENS... Sensitive    CEFTAZIDIME <=1 SENSITIVE  Sensitive    CIPROFLOXACIN <=0.25 SENS... Sensitive    GENTAMICIN <=1 SENSITIVE  Sensitive    IMIPENEM <=0.25 SENS... Sensitive    PIP/TAZO <=4 SENSITIVE  Sensitive    TRIMETH/SULFA >=320 RESIS... Resistant           Other culture-see note  Unresulted Labs (From admission, onward)     Start     Ordered   04/09/21 0500  Creatinine, serum  (enoxaparin (LOVENOX)    CrCl >/= 30 ml/min)  Weekly,   R     Comments: while on enoxaparin therapy    04/02/21 0344   04/06/21 0500  CBC  Every 48 hours,   R     Question:  Specimen collection method  Answer:  Lab=Lab collect   04/05/21 1848   04/06/21 0500  Basic metabolic panel  Every 48 hours,   R     Question:  Specimen collection method  Answer:  Lab=Lab collect   04/05/21 1848           Data Reviewed: I have personally reviewed following labs and imaging studies CBC: Recent Labs  Lab 04/06/21 0443 04/08/21 0503 04/09/21 0452 04/10/21 0553 04/12/21 0459  WBC 14.6* 17.2* 16.0* 13.3* 11.2*  HGB 11.0* 11.0* 10.7* 10.4* 10.4*  HCT 33.2* 34.3* 32.6* 32.3* 33.3*  MCV 86.0 88.4 87.9 87.1 89.0  PLT 305 325 309 338 391    Basic Metabolic Panel: Recent Labs  Lab 04/06/21 0443 04/08/21 0503 04/09/21 0452 04/10/21 0553 04/12/21 0459  NA 139 139 137 133* 137  K 3.7 4.2 4.3 4.1 4.0  CL 107 108 107 104 106  CO2 22 22 21* 23 24  GLUCOSE 129* 113* 118* 111* 102*  BUN 24* 30* 26* 23 24*  CREATININE 1.44* 1.46* 1.38* 1.40* 1.36*  CALCIUM 8.5* 8.5* 8.4* 8.4* 8.8*  MG  --   --  2.2  --   --   PHOS  --   --  4.4  --   --     GFR: Estimated Creatinine Clearance: 51.7  mL/min (A) (by C-G formula based on SCr of 1.36 mg/dL (H)). Liver Function Tests: No results for input(s): AST, ALT, ALKPHOS, BILITOT, PROT, ALBUMIN in the last 168 hours.  No results for input(s): LIPASE, AMYLASE in the last 168 hours. No results for input(s): AMMONIA in the last 168 hours. Coagulation Profile: Recent Labs  Lab 04/06/21 1506  INR 1.2  Cardiac Enzymes: No results for input(s): CKTOTAL, CKMB, CKMBINDEX, TROPONINI in the last 168 hours. BNP (last 3 results) No results for input(s): PROBNP in the last 8760 hours. HbA1C: No results for input(s): HGBA1C in the last 72 hours. CBG: No results for input(s): GLUCAP in the last 168 hours. Lipid Profile: No results for input(s): CHOL, HDL, LDLCALC, TRIG, CHOLHDL, LDLDIRECT in the last 72 hours. Thyroid Function Tests: No results for input(s): TSH, T4TOTAL, FREET4, T3FREE, THYROIDAB in the last 72 hours. Anemia Panel: No results for input(s): VITAMINB12, FOLATE, FERRITIN, TIBC, IRON, RETICCTPCT in the last 72 hours. Sepsis Labs: No results for input(s): PROCALCITON, LATICACIDVEN in the last 168 hours.  Recent Results (from the past 240 hour(s))  Culture, blood (routine x 2)     Status: None   Collection Time: 04/02/21 11:24 PM   Specimen: BLOOD  Result Value Ref Range Status   Specimen Description   Final    BLOOD BLOOD RIGHT HAND Performed at [facility], [address], [place], [place] [address]    Special Requests   Final    BOTTLES DRAWN AEROBIC AND ANAEROBIC Blood Culture adequate volume Performed at [facility], [address] [place], [place] [address]    Culture   Final    NO GROWTH 5 DAYS Performed at [facility], [address], [place], [place] [address]    Report Status 04/08/2021 FINAL  Final  Culture, blood (routine x 2)     Status: None   Collection Time: 04/02/21 11:26 PM   Specimen: BLOOD  Result Value Ref Range Status   Specimen Description    Final    BLOOD RIGHT ANTECUBITAL Performed at [facility], [address] [place], [place] [address]    Special Requests   Final    BOTTLES DRAWN AEROBIC AND ANAEROBIC Blood Culture adequate volume Performed at [facility], [address] [place], [place] [address]    Culture   Final    NO GROWTH 5 DAYS Performed at [facility], [address], [place], [place] [address]    Report Status 04/08/2021 FINAL  Final  Aerobic/Anaerobic Culture w Gram Stain (surgical/deep wound)     Status: None (Preliminary result)   Collection Time: 04/07/21 12:56 PM   Specimen: Abscess  Result Value Ref Range Status   Specimen Description ABSCESS  Final   Special Requests POST RIGHT SIDED PCN PLACEMENT  Final   Gram Stain   Final    ABUNDANT WBC PRESENT, PREDOMINANTLY PMN RARE GRAM POSITIVE COCCI Performed at [facility], [address], [place], [place] [address]    Culture   Final    RARE ENTEROBACTER CLOACAE NO ANAEROBES ISOLATED; CULTURE IN PROGRESS FOR 5 DAYS    Report Status PENDING  Incomplete   Organism ID, Bacteria ENTEROBACTER CLOACAE  Final      Susceptibility   Enterobacter cloacae - MIC*    CEFAZOLIN >=64 RESISTANT Resistant     CEFEPIME <=0.12 SENSITIVE Sensitive     CEFTAZIDIME <=1 SENSITIVE Sensitive     CIPROFLOXACIN <=0.25 SENSITIVE Sensitive     GENTAMICIN <=1 SENSITIVE Sensitive     IMIPENEM <=0.25 SENSITIVE Sensitive     TRIMETH/SULFA >=320 RESISTANT Resistant     PIP/TAZO <=4 SENSITIVE Sensitive     * RARE ENTEROBACTER CLOACAE      Radiology Studies: No results found.   LOS: 10 days   Lanae Boast, MD [facility]  04/12/2021, 7:45 AM

## 2021-04-12 NOTE — Progress Notes (Signed)
Occupational Therapy Treatment Patient Details Name: Nicholas Mckenzie MRN: [id number] DOB: [birth year] Today's Date: 04/12/2021   History of present illness The pt is a 75 yo male admitted 10/8 to [facility] with c/o dizziness and weakness x2 weeks. Pt found to have UTI, nephrolithiasis, and obstructed right ureteral stent with percutaneous nephrostomy by IR with PCN placement on 04/07/21. PT noted vestibular dysfunction indicating central lesion on 10/11 and pt then found to have R PCA, L cerebellar, and L periventricular infarcts as well as left inferior quadrantanopsia. PMH includes: accidential overdose, CKD III, depression, HTN, and kidney stones.   OT comments  Patient received in bed and asking to use BSC. Patient required assistance to get to eob and demonstrated Right lateral leaning but was able to correct with verbal cues. Patient used rw to transfer to [facility] with verbal cues for hand placement.  Patient required extra time and assistance to complete hygiene. Grooming tasks performed seated in recliner. Acute OT to continue to follow.    Recommendations for follow up therapy are one component of a multi-disciplinary discharge planning process, led by the attending physician.  Recommendations may be updated based on patient status, additional functional criteria and insurance authorization.    Follow Up Recommendations  CIR    Equipment Recommendations  3 in 1 bedside commode    Recommendations for Other Services      Precautions / Restrictions Precautions Precautions: Fall Precaution Comments: dizzness, nausea Restrictions Weight Bearing Restrictions: No       Mobility Bed Mobility Overal bed mobility: Needs Assistance Bed Mobility: Supine to Sit     Supine to sit: Mod assist;HOB elevated     General bed mobility comments: required extra time assistance guiding LEs off bed    Transfers Overall transfer level: Needs assistance Equipment used: Rolling walker (2  wheeled) Transfers: Sit to/from [employer] Sit to Stand: Mod assist;Min assist Stand pivot transfers: Min guard;Mod assist       General transfer comment: verbal cues for safety and to push up from surface and to reach back before sitting    Balance Overall balance assessment: Needs assistance;History of Falls Sitting-balance support: Feet supported;Bilateral upper extremity supported Sitting balance-Leahy Scale: Poor Sitting balance - Comments: Pt with UE support Postural control: Right lateral lean Standing balance support: Bilateral upper extremity supported Standing balance-Leahy Scale: Poor Standing balance comment: able to use one extremity for toilet hygiene                           ADL either performed or assessed with clinical judgement   ADL Overall ADL's : Needs assistance/impaired     Grooming: Brushing hair;Set up;Sitting Grooming Details (indicate cue type and reason): performed seated in recliner             Lower Body Dressing: Minimal assistance;Sitting/lateral leans Lower Body Dressing Details (indicate cue type and reason): able to donn sock on LLE and required assistance with right sock Toilet Transfer: Min guard;Moderate assistance;BSC;RW Toilet Transfer Details (indicate cue type and reason): required assistance with guiding walker Toileting- Clothing Manipulation and Hygiene: Moderate assistance Toileting - Clothing Manipulation Details (indicate cue type and reason): required assistance to complete hygiene     Functional mobility during ADLs: Min guard;Minimal assistance;Rolling walker General ADL Comments: patient was unsteady and complained of dizziness     Vision       Perception     Praxis      Cognition Arousal/Alertness: Awake/alert  Behavior During Therapy: WFL for tasks assessed/performed Overall Cognitive Status: Impaired/Different from baseline Area of Impairment: Attention;Following  commands;Problem solving                 Orientation Level: Disoriented to;Situation Current Attention Level: Selective   Following Commands: Follows one step commands consistently;Follows multi-step commands inconsistently     Problem Solving: Slow processing;Requires verbal cues;Requires tactile cues;Difficulty sequencing General Comments: required extra time to respond to commands and often tactile cues        Exercises     Shoulder Instructions       General Comments      Pertinent Vitals/ Pain       Pain Assessment: No/denies pain  Home Living                                          Prior Functioning/Environment              Frequency  Min 2X/week        Progress Toward Goals  OT Goals(current goals can now be found in the care plan section)  Progress towards OT goals: Progressing toward goals  Acute Rehab OT Goals Patient Stated Goal: to rest OT Goal Formulation: With patient Time For Goal Achievement: 04/18/21 Potential to Achieve Goals: Good ADL Goals Pt Will Perform Upper Body Bathing: with modified independence;sitting Pt Will Transfer to Toilet: with supervision;bedside commode;ambulating Additional ADL Goal #1: patient to demonstrate use of compesatory strategies to compenate for visual deficits with MI during ADL tasks.  Plan Discharge plan remains appropriate    Co-evaluation                 AM-PAC OT "6 Clicks" Daily Activity     Outcome Measure   Help from another person eating meals?: A Little Help from another person taking care of personal grooming?: A Little Help from another person toileting, which includes using toliet, bedpan, or urinal?: A Lot Help from another person bathing (including washing, rinsing, drying)?: A Lot Help from another person to put on and taking off regular upper body clothing?: A Lot Help from another person to put on and taking off regular lower body clothing?: A Lot 6  Click Score: 14    End of Session Equipment Utilized During Treatment: Rolling walker  OT Visit Diagnosis: Unsteadiness on feet (R26.81);Other symptoms and signs involving cognitive function;Muscle weakness (generalized) (M62.81);Other abnormalities of gait and mobility (R26.89);History of falling (Z91.81)   Activity Tolerance Patient tolerated treatment well   Patient Left in chair;with call bell/phone within reach;with chair alarm set;with family/visitor present   Nurse Communication Mobility status        Time: 1610-9604 OT Time Calculation (min): 29 min  Charges: OT General Charges $OT Visit: 1 Visit OT Treatments $Self Care/Home Management : 23-37 mins  Alfonse Flavors, OTA Acute Rehabilitation Services  Pager [phone] Office [phone]   Dewain Penning 04/12/2021, 10:39 AM

## 2021-04-12 NOTE — Progress Notes (Signed)
Mobility Specialist:   04/12/21 1643  Mobility  Activity Transferred:  Chair to bed  Level of Assistance Minimal assist, patient does 75% or more  Assistive Device Front wheel walker  Distance Ambulated (ft) 4 ft  Mobility Ambulated with assistance in room  Mobility Response Tolerated well  Mobility performed by Mobility specialist;Nurse  $Mobility charge 1 Mobility   Pt assisted back to the bed from chair per request from RN. Pt required minA to stand from chair as well as verbal cues for hand placement and direction for RW. Pt back in bed with RN present in the room.  [facility] Shabana Armentrout Mobility Specialist Mobility Specialist Phone: [phone]

## 2021-04-12 NOTE — Progress Notes (Signed)
Subjective: Lerone has done well from a GU standpoint since placement of the right NT on 10/14.  He had 3 liters of purulent urine drained and the culture grew enterobacter which was the same as his initial urine culture.  He is afebrile and has good UOP with  stable mild CKD.   The NT is not draining much as expected but he is voiding into a condom cath.  ROS:  Review of Systems  Unable to perform ROS: Mental status change   Anti-infectives: Anti-infectives (From admission, onward)    Start     Dose/Rate Route Frequency Ordered Stop   04/08/21 1200  piperacillin-tazobactam (ZOSYN) IVPB 3.375 g        3.375 g 12.5 mL/hr over 240 Minutes Intravenous Every 8 hours 04/08/21 0551     04/08/21 0600  piperacillin-tazobactam (ZOSYN) IVPB 3.375 g        3.375 g 100 mL/hr over 30 Minutes Intravenous  Once 04/08/21 0551 04/08/21 0701   04/03/21 0215  cefTRIAXone (ROCEPHIN) 1 g in sodium chloride 0.9 % 100 mL IVPB  Status:  Discontinued        1 g 200 mL/hr over 30 Minutes Intravenous Every 24 hours 04/02/21 0344 04/08/21 0538   04/02/21 0215  cefTRIAXone (ROCEPHIN) 1 g in sodium chloride 0.9 % 100 mL IVPB        1 g 200 mL/hr over 30 Minutes Intravenous  Once 04/02/21 0213 04/02/21 0251       Current Facility-Administered Medications  Medication Dose Route Frequency Provider Last Rate Last Admin   0.9 %  sodium chloride infusion   Intravenous PRN Lanae Boast, MD   Stopped at 04/10/21 2056   acetaminophen (TYLENOL) tablet 650 mg  650 mg Oral Q6H PRN Briscoe Deutscher, MD   650 mg at 04/06/21 2017   Or   acetaminophen (TYLENOL) suppository 650 mg  650 mg Rectal Q6H PRN Opyd, Lavone Neri, MD       amLODipine (NORVASC) tablet 5 mg  5 mg Oral Daily Esaw Grandchild A, DO   5 mg at 04/12/21 0944   aspirin chewable tablet 81 mg  81 mg Oral Daily Erick Blinks, MD   81 mg at 04/12/21 0944   clopidogrel (PLAVIX) tablet 75 mg  75 mg Oral Daily Esaw Grandchild A, DO   75 mg at 04/12/21 0944    enoxaparin (LOVENOX) injection 40 mg  40 mg Subcutaneous Q24H Opyd, Lavone Neri, MD   40 mg at 04/12/21 0944   fentaNYL (SUBLIMAZE) injection    PRN Simonne Come, MD   25 mcg at 04/07/21 0940   FLUoxetine (PROZAC) capsule 40 mg  40 mg Oral Daily Kc, Ramesh, MD   40 mg at 04/12/21 0944   iohexol (OMNIPAQUE) 300 MG/ML solution 100 mL  100 mL Per Tube Once PRN Simonne Come, MD       lactobacillus (FLORANEX/LACTINEX) granules 1 g  1 g Oral BID Esaw Grandchild A, DO   1 g at 04/12/21 0944   melatonin tablet 3 mg  3 mg Oral QHS Dow Adolph N, DO   3 mg at 04/11/21 2122   midazolam (VERSED) injection    PRN Simonne Come, MD   0.5 mg at 04/07/21 0939   piperacillin-tazobactam (ZOSYN) IVPB 3.375 g  3.375 g Intravenous Q8H Juliette Mangle, RPH 12.5 mL/hr at 04/12/21 1249 3.375 g at 04/12/21 1249   promethazine (PHENERGAN) tablet 12.5 mg  12.5 mg Oral Q6H PRN Azucena Fallen, MD  12.5 mg at 04/12/21 1012   Or   promethazine (PHENERGAN) 12.5 mg in sodium chloride 0.9 % 50 mL IVPB  12.5 mg Intravenous Q6H PRN Azucena Fallen, MD       Or   promethazine (PHENERGAN) suppository 25 mg  25 mg Rectal Q6H PRN Azucena Fallen, MD       sodium chloride flush (NS) 0.9 % injection 5 mL  5 mL Intracatheter Sherron Ales, MD   5 mL at 04/12/21 1317     Objective: Vital signs in last 24 hours: Temp:  [97.9 F (36.6 C)-98.8 F (37.1 C)] 98.8 F (37.1 C) (10/19 1543) Pulse Rate:  [68-83] 83 (10/19 1543) Resp:  [13-20] 18 (10/19 1543) BP: (126-144)/(73-83) 126/73 (10/19 1543) SpO2:  [97 %-98 %] 98 % (10/19 1543) Weight:  [86.2 kg] 86.2 kg (10/19 0351)  Intake/Output from previous day: 10/18 0701 - 10/19 0700 In: 55 [P.O.:50] Out: 1450 [Urine:1450] Intake/Output this shift: Total I/O In: 50 [P.O.:50] Out: 551 [Urine:550; Stool:1]   Physical Exam Vitals reviewed.    Lab Results:  Recent Labs    04/10/21 0553 04/12/21 0459  WBC 13.3* 11.2*  HGB 10.4* 10.4*  HCT 32.3* 33.3*  PLT 338  391   BMET Recent Labs    04/10/21 0553 04/12/21 0459  NA 133* 137  K 4.1 4.0  CL 104 106  CO2 23 24  GLUCOSE 111* 102*  BUN 23 24*  CREATININE 1.40* 1.36*  CALCIUM 8.4* 8.8*   PT/INR No results for input(s): LABPROT, INR in the last 72 hours. ABG No results for input(s): PHART, HCO3 in the last 72 hours.  Invalid input(s): PCO2, PO2  Studies/Results: No results found.  I spoke to his wife regarding the plan.  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  He was to have a right nephroureterectomy but will need 3 months of antiplatelet therapy for the recent stroke.  He will continue with nephrostomy drainage in the interim and I will arrange f/u for 8-10wks.  He will need IR to change the NT in 6 weeks.  UTI.  He has been treated for the enterobacter and as the kidney is drained and he is voiding ok, I would not keep him on prolonged therapy, but only treat if symptomatic and reculture prior to any  procedure.        LOS: 10 days    Bjorn Pippin 04/12/2021 [phone] Patient ID: [name], male   DOB: [birth year], 75 y.o.   MRN: [id number]

## 2021-04-12 NOTE — Progress Notes (Signed)
Physical Therapy Treatment Patient Details Name: Guhan Bruington MRN: [id number] DOB: Oct 31, 1945 Today's Date: 04/12/2021   History of Present Illness The pt is a 75 yo male admitted 10/[facility] to [facility] with c/o dizziness and weakness x2 weeks. Pt found to have UTI, nephrolithiasis, and obstructed right ureteral stent with percutaneous nephrostomy by IR with PCN placement on 04/07/21. PT noted vestibular dysfunction indicating central lesion on 10/11 and pt then found to have R PCA, L cerebellar, and L periventricular infarcts as well as left inferior quadrantanopsia. PMH includes: accidential overdose, CKD III, depression, HTN, and kidney stones.    PT Comments    Patient received in bed, pleasant and cooperative but more fatigued today. Family states he was not able to get his cat nap due to having more visitors than usual. Continues to require [place] for dynamic balance and safety with gait today, needed multiple rest breaks due to fatigue. Has poor awareness of environment and safety in general, as well as quite a bit of difficulty navigating around obstacles. Left up in recliner positioned to comfort with all needs met, chair alarm active. Continue to recommend CIR.    Recommendations for follow up therapy are one component of a multi-disciplinary discharge planning process, led by the attending physician.  Recommendations may be updated based on patient status, additional functional criteria and insurance authorization.  Follow Up Recommendations  CIR     Equipment Recommendations  Rolling walker with 5" wheels;Wheelchair cushion (measurements PT);Wheelchair (measurements PT)    Recommendations for Other Services       Precautions / Restrictions Precautions Precautions: Fall Precaution Comments: dizzness, nausea Restrictions Weight Bearing Restrictions: No     Mobility  Bed Mobility Overal bed mobility: Needs Assistance Bed Mobility: Supine to Sit     Supine to sit: Min assist;HOB  elevated     General bed mobility comments: required extra time assistance guiding LEs off bed and boosting trunk to midline    Transfers Overall transfer level: Needs assistance Equipment used: Rolling walker (2 wheeled) Transfers: Sit to/from Stand Sit to Stand: Mod assist         General transfer comment: heavy ModA to boost up to standing as well as hand over hand assist for safe transition to RW in standing  Ambulation/Gait Ambulation/Gait assistance: Mod assist Gait Distance (Feet): 20 Feet (1f, 163f Assistive device: Rolling walker (2 wheeled) Gait Pattern/deviations: Step-to pattern;Decreased step length - right;Decreased step length - left;Decreased stride length;Trunk flexed;Wide base of support Gait velocity: decreased   General Gait Details: continues to [facility]; fatigues easily and needs multimodal cues for corrent hand placement on RW. Does still benefit from "left right" cues for sequencing.   Stairs             Wheelchair Mobility    Modified Rankin (Stroke Patients Only)       Balance Overall balance assessment: Needs assistance;History of Falls Sitting-balance support: Feet supported;Bilateral upper extremity supported Sitting balance-Leahy Scale: Fair Sitting balance - Comments: Pt reliant on UE support   Standing balance support: Bilateral upper extremity supported Standing balance-Leahy Scale: Poor Standing balance comment: reliant on BUE and external support                            Cognition Arousal/Alertness: Awake/alert Behavior During Therapy: WFL for tasks assessed/performed;Impulsive Overall Cognitive Status: Impaired/Different from baseline Area of Impairment: Attention;Following commands;Problem solving  Current Attention Level: Selective   Following Commands: Follows one step commands consistently;Follows multi-step commands inconsistently;Follows one  step commands with increased time     Problem Solving: Slow processing;Requires verbal cues;Requires tactile cues;Difficulty sequencing General Comments: required extra time to respond to commands and often tactile cues, needs intermittent hand over hand assist      Exercises      General Comments        Pertinent Vitals/Pain Pain Assessment: No/denies pain Faces Pain Scale: No hurt Pain Intervention(s): Limited activity within patient's tolerance;Monitored during session    Home Living                      Prior Function            PT Goals (current goals can now be found in the care plan section) Acute Rehab PT Goals Patient Stated Goal: to rest PT Goal Formulation: With patient/family Time For Goal Achievement: 04/17/21 Potential to Achieve Goals: Good Progress towards PT goals: Progressing toward goals    Frequency    Min 4X/week      PT Plan Current plan remains appropriate    Co-evaluation              AM-PAC PT "6 Clicks" Mobility   Outcome Measure  Help needed turning from your back to your side while in a flat bed without using bedrails?: A Little Help needed moving from lying on your back to sitting on the side of a flat bed without using bedrails?: A Lot Help needed moving to and from a bed to a chair (including a wheelchair)?: A Lot Help needed standing up from a chair using your arms (e.g., wheelchair or bedside chair)?: A Lot Help needed to walk in hospital room?: A Lot Help needed climbing 3-5 steps with a railing? : Total 6 Click Score: 12    End of Session Equipment Utilized During Treatment: Gait belt Activity Tolerance: Patient tolerated treatment well Patient left: in chair;with call bell/phone within reach;with chair alarm set Nurse Communication: Mobility status PT Visit Diagnosis: Muscle weakness (generalized) (M62.81);Difficulty in walking, not elsewhere classified (R26.2);Ataxic gait (R26.0)     Time:  1610-9604 PT Time Calculation (min) (ACUTE ONLY): 28 min  Charges:  $Gait Training: 8-22 mins $Therapeutic Activity: 8-22 mins                    Windell Norfolk, DPT, PN2   Supplemental Physical Therapist [place]    Pager [phone] Acute Rehab Office [phone]

## 2021-04-12 NOTE — Progress Notes (Signed)
Speech Language Pathology Treatment: Dysphagia  Patient Details Name: Nicholas Mckenzie MRN: [id number] DOB: 1946-01-10 Today's Date: 04/12/2021 Time: 0940-1000 SLP Time Calculation (min) (ACUTE ONLY): 20 min  Assessment / Plan / Recommendation Clinical Impression  Pt seen at bedside to assess readiness for advanced solid textures. New order for BSE received, however, pt is currently on ST caseload. Pt was seated in recliner, wife present.  Pt was observed eating [place] sandwich and pretzels, drinking water via straw. RN was present with PO medications, and pt tolerated multiple pills with water. No overt s/s aspiration observed following any consistency given. Pt appears to have made improvement since last ST visit. Will advance diet to regular solids and thin liquids. Safe swallow precautions updated at [facility]. ST will follow up at least once more to assess tolerance of advanced diet.   HPI HPI: 75yo male admitted 04/01/21 with malaise, lightheadedness, confusion, dysuria, n/v, UTI d/t R ureteral stent obstruction (surgery now on hold due to infection). 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 PMH: CKD3, HTN, kidney stones, OSA. 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 New order for BSE received, due to pt request to advance solid textures.      SLP Plan  Continue with current plan of care      Recommendations for follow up therapy are one component of a multi-disciplinary discharge planning process, led by the attending physician.  Recommendations may be updated based on patient status, additional functional criteria and insurance authorization.    Recommendations  Diet recommendations: Regular;Thin liquid Liquids provided via: Straw;Cup Medication Administration: Whole meds with liquid Supervision: Patient able to self feed Compensations: Slow rate;Small sips/bites;Minimize environmental distractions Postural Changes and/or Swallow Maneuvers: Seated upright 90 degrees;Upright  30-60 min after meal                Oral Care Recommendations: Oral care BID Follow up Recommendations: Other (comment) (TBD) SLP Visit Diagnosis: Dysphagia, unspecified (R13.10) Plan: Continue with current plan of care       GO               Renata Gambino B. Murvin Natal, [facility], CCC-SLP Speech Language Pathologist Office: [phone]  [name]  04/12/2021, 10:05 AM

## 2021-04-13 DIAGNOSIS — G9340 Encephalopathy, unspecified: Secondary | ICD-10-CM | POA: Diagnosis not present

## 2021-04-13 MED ORDER — FLORANEX PO PACK: 1.0000 g | PACK | Freq: Two times a day (BID) | ORAL | Status: DC

## 2021-04-13 MED ORDER — CIPROFLOXACIN HCL 500 MG PO TABS: 500.0000 mg | ORAL_TABLET | Freq: Two times a day (BID) | ORAL | 0 refills | Status: AC

## 2021-04-13 MED ORDER — CLOPIDOGREL BISULFATE 75 MG PO TABS: 75.0000 mg | ORAL_TABLET | Freq: Every day | ORAL | Status: DC

## 2021-04-13 MED ORDER — ASPIRIN 81 MG PO CHEW: 81.0000 mg | CHEWABLE_TABLET | Freq: Every day | ORAL | Status: DC

## 2021-04-13 MED ORDER — AMLODIPINE BESYLATE 5 MG PO TABS: 5.0000 mg | ORAL_TABLET | Freq: Every day | ORAL | Status: DC

## 2021-04-13 NOTE — Discharge Summary (Addendum)
Physician Discharge Summary  Breck [place] FTD:[id number] DOB: 1945/09/03 DOA: 04/01/2021  PCP: Eartha Inch, MD  Admit date: 04/01/2021 Discharge date: 04/14/2021  Admitted From: home Disposition:  inpatient Rehab  Recommendations for Outpatient Follow-up:  Follow up with PCP in 1-2 weeks Please obtain BMP/CBC in one week IR in 6 wk for nephrostomy change  Dr Annabell Howells from urology for  nephouretectomy Post stroke fu with neurology in 4-6 wks   Home Health:no  Equipment/Devices: none  Discharge Condition: Stable Code Status:   Code Status: Full Code Diet recommendation:  Diet Order             Diet Heart Room service appropriate? Yes; Fluid consistency: Thin  Diet effective now                    Brief/Interim Summary:  Nicholas Mckenzie, 75 y.o. male with PMH of  htn, nephrolithiasis,  OSA on CPAP,  CKD3a,depression p/w fatigue, lightheadedness, dysuria, confusion, nausea, vomiting, and loose stools.  He had recent admission 3 wks ago for polypharmacy/accidental overdose.  In the ED, found to have complicated UTI in the setting of right ureteral stent obstruction.  Seen by urology status post percutaneous nephrostomy by IR 10/14 with 3 L of purulent fluid aspirated following PCN placement. Hospital course complicated by altered mental status central vertigo and acute/subacute CVA on 04/05/2021 involving the right PCA territory cortical/subcortical infarct, chronic lacunar infarct in the right basal ganglia and seen by neurology/stroke team.Due to persistent leukocytosis antibiotics switched to Zosyn 10/15-WBC counts are improving.  Afebrile mental status improved.  Remains deconditioned and weak PT OT continues to recommend CIR. seen by his urologist who will plan to evaluate in 3 months for right nephroureterectomy and will follow-up as outpatient and to continue with nephrostomy cath by IR. TOC informed- he will have a bed at  [facility] tomorrow- he will be transported tomorrow to  rehab. 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 Neurology advised to continue aspirin 81, Plavix 75 mg x 3 months followed by aspirin alone, without any break in therapy unless for urgent or emergent issues procedure or surgery.    Dysphagia resolved now on a regular diet   Complicated Enterobacter cloacae/Aerococcus species CWC:[id number] from PCN 10/14-rare Enterobacter cloacae-  (S- Cipro/zosyn/Cefepime).  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 IR Planning for nephrostomy catheter exchange in 6 to 8 weeks.).  Seen by Dr. Annabell Howells- urology 2/2 his stroke-surgery (right nephroureterectomy) will be deferred for 3 months0 he is on DAPT x 3 months- he will plan outpatient follow-up Recent Labs  Lab 04/08/21 0503 04/09/21 0452 04/10/21 0553 04/12/21 0459 04/14/21 0618  WBC 17.2* 16.0* 13.3* 11.2* 9.9   CKD stage IIIa: Stable renal function.  Monitor labs intermittently [place] metabolic acidosis- resolved. Recent  Labs  Lab 04/08/21 0503 04/09/21 0452 04/10/21 0553 04/12/21 0459 04/14/21 0618  BUN 30* 26* 23 24* 21  CREATININE 1.46* 1.38* 1.40* 1.36* 1.30*    Essential hypertension: Well-controlled on amlodipine 5 mg.  Continue to  hold home telmisartan and metoprolol.  Acute metabolic encephalopathy in the setting of sepsis, stroke likely multifactorial.  Mental status is improved.  At times slow to respond but communicative. He remains weak deconditioned, recent admission for polypharmacy.  Continue delirium precaution, supportive care.  He is back on his Prozac, continue melatonin holding off on resuming Seroquel  Ascending aortic aneurysm 4.5 cm-seen in TTE, recommended follow-up CTA advise to see PCP within 2 to 4 weeks.  Continue BP control.  Nausea/vomiting/diarrhea: Resolved continue  Transaminitis resolved  Depression: Followed by outpatient psychiatry.  Continue Home Prozac but holding Seroquel after discussion with patient's wife. Continue on melatonin  OSA continue CPAP nightly  Class I Obesity:Patient's Body mass index is 28.89 kg/m. : Will benefit with PCP follow-up, weight loss  healthy lifestyle.  Physical deconditioning/debility: Remains physically weak deconditioned.  Will benefit with rehabilitation -CIR awaiting placement.  Consults: urology  Subjective: Aaox3, wife at bedside. Feels ready for discharge to rehab  Discharge Exam: Vitals:   04/14/21 0327 04/14/21 0804  BP: (!) 163/100 (!) 158/92  Pulse: 62   Resp: 12   Temp: (!) 97 F (36.1 C)   SpO2: 96%    General: Pt is alert, awake, not in acute distress Cardiovascular: RRR, S1/S2 +, no rubs, no gallops Respiratory: CTA bilaterally, no wheezing, no rhonchi Abdominal: Soft, NT, ND, bowel sounds + Extremities: no edema, no cyanosis  Discharge Instructions  Discharge Instructions     Discharge instructions   Complete by: As directed    Fu with urology for surgery plan.. f/u with IR in 6 wk for nephrostomy tube change  Please call call MD or return to ER for similar or worsening recurring problem that brought you to hospital or if any fever,nausea/vomiting,abdominal pain, uncontrolled pain, chest pain,  shortness of breath or  any other alarming symptoms.  Please follow-up your doctor as instructed in a week time and call the office for appointment.  Please avoid alcohol, smoking, or any other illicit substance and maintain healthy habits including taking your regular medications as prescribed.  You were cared for by a hospitalist during your hospital stay. If you have any questions about your discharge medications or the care you received while you were in the hospital after you are discharged, you can call the unit and ask to speak with the hospitalist on call if the hospitalist that took care of you is not available.  Once you are discharged, your primary care physician will handle any further medical issues. Please note that NO REFILLS for any discharge medications will be authorized once you are discharged, as it is imperative that you return to your primary care physician (or establish a relationship with a primary care physician if you do not have one) for your aftercare needs so that they can reassess your need for medications and monitor your lab values   Increase activity slowly   Complete by: As directed    No wound care   Complete by: As directed       Allergies as of 04/14/2021       Reactions   Statins Other (See Comments)   Pt reports severe weakness and mobility issues with his med, as well as diarrhea  Medication List     STOP taking these medications    hydrALAZINE 25 MG tablet Commonly known as: APRESOLINE   metoprolol succinate 25 MG 24 hr tablet Commonly known as: TOPROL-XL   QUEtiapine 50 MG tablet Commonly known as: SEROQUEL   rosuvastatin 10 MG tablet Commonly known as: CRESTOR   sildenafil 20 MG tablet Commonly known as: REVATIO   telmisartan 80 MG tablet Commonly known as: MICARDIS       TAKE these medications    amLODipine 5 MG tablet Commonly known as: NORVASC Take 1 tablet (5 mg total) by mouth daily. What changed:  medication strength how much to  take   aspirin 81 MG chewable tablet Chew 1 tablet (81 mg total) by mouth daily.   ciprofloxacin 500 MG tablet Commonly known as: Cipro Take 1 tablet (500 mg total) by mouth 2 (two) times daily for 7 days.   clopidogrel 75 MG tablet Commonly known as: PLAVIX Take 1 tablet (75 mg total) by mouth daily.   FLUoxetine 40 MG capsule Commonly known as: PROZAC Take 40 mg by mouth daily.   lactobacillus Pack Take 1 packet (1 g total) by mouth 2 (two) times daily.   melatonin 3 MG Tabs tablet Take 3 mg by mouth at bedtime.   multivitamin with minerals tablet Take 1 tablet by mouth daily.        Follow-up Information     [facility]-EMERGENCY DEPT. Go to .   Specialty: Emergency Medicine Why: If symptoms worsen Contact information: [id number] [address] [place] [address] [phone]        [facility] Follow up.   Specialty: Home Health Services Why: PT        Bjorn Pippin, MD Follow up in 2 week(s).   Specialty: Urology Contact information: [address] [place] [address] [phone]                Allergies  Allergen Reactions   Statins Other (See Comments)    Pt reports severe weakness and mobility issues with his med, as well as diarrhea    The results of significant diagnostics from this hospitalization (including imaging, microbiology, ancillary and laboratory) are listed below for reference.    Microbiology: Recent Results (from the past 240 hour(s))  Aerobic/Anaerobic Culture w Gram Stain (surgical/deep wound)     Status: None   Collection Time: 04/07/21 12:56 PM   Specimen: Abscess  Result Value Ref Range Status   Specimen Description ABSCESS  Final   Special Requests POST RIGHT SIDED PCN PLACEMENT  Final   Gram Stain   Final    ABUNDANT WBC PRESENT, PREDOMINANTLY PMN RARE GRAM POSITIVE COCCI    Culture   Final    RARE ENTEROBACTER CLOACAE NO ANAEROBES  ISOLATED Performed at [facility], [address] [place], [place] [address]    Report Status 04/12/2021 FINAL  Final   Organism ID, Bacteria ENTEROBACTER CLOACAE  Final      Susceptibility   Enterobacter cloacae - MIC*    CEFAZOLIN >=64 RESISTANT Resistant     CEFEPIME <=0.12 SENSITIVE Sensitive     CEFTAZIDIME <=1 SENSITIVE Sensitive     CIPROFLOXACIN <=0.25 SENSITIVE Sensitive     GENTAMICIN <=1 SENSITIVE Sensitive     IMIPENEM <=0.25 SENSITIVE Sensitive     TRIMETH/SULFA >=320 RESISTANT Resistant     PIP/TAZO <=4 SENSITIVE Sensitive     * RARE ENTEROBACTER CLOACAE  SARS CORONAVIRUS  2 (TAT 6-24 HRS) Nasopharyngeal Nasopharyngeal Swab     Status: None   Collection Time: 04/12/21 11:07 AM   Specimen: Nasopharyngeal Swab  Result Value Ref Range Status   SARS Coronavirus 2 NEGATIVE NEGATIVE Final    Comment: (NOTE) SARS-CoV-2 target nucleic acids are NOT DETECTED.  The SARS-CoV-2 RNA is generally detectable in upper and lower respiratory specimens during the acute phase of infection. Negative results do not preclude SARS-CoV-2 infection, do not rule out co-infections with other pathogens, and should not be used as the sole basis for treatment or other patient management decisions. Negative results must be combined with clinical observations, patient history, and epidemiological information. The expected result is Negative.  Fact Sheet for Patients: HairSlick.no  Fact Sheet for Healthcare Providers: quierodirigir.com  This test is not yet approved or cleared by the [place] FDA and  has been authorized for detection and/or diagnosis of SARS-CoV-2 by FDA under an Emergency Use Authorization (EUA). This EUA will remain  in effect (meaning this test can be used) for the duration of the COVID-19 declaration under Se ction 564(b)(1) of the Act, 21 U.S.C. section 360bbb-3(b)(1), unless the authorization is  terminated or revoked sooner.  Performed at [facility], [address], [place], [place] [address]     Procedures/Studies: CT ABDOMEN PELVIS WO CONTRAST  Result Date: 04/01/2021 CLINICAL DATA:  Diarrhea, weakness, dizziness for 2 weeks EXAM: CT ABDOMEN AND PELVIS WITHOUT CONTRAST TECHNIQUE: Multidetector CT imaging of the abdomen and pelvis was performed following the standard protocol without IV contrast. Unenhanced CT was performed per clinician order. Lack of IV contrast limits sensitivity and specificity, especially for evaluation of abdominal/pelvic solid viscera. COMPARISON:  02/10/2021 FINDINGS: Lower chest: No acute pleural or parenchymal lung disease. Small hiatal hernia. Hepatobiliary: No focal liver abnormality is seen. No gallstones, gallbladder wall thickening, or biliary dilatation. Pancreas: Unremarkable. No pancreatic ductal dilatation or surrounding inflammatory changes. Spleen: Normal in size without focal abnormality. Adrenals/Urinary Tract: There is progressive severe right-sided obstructive uropathy with worsening hydronephrosis and hydroureter. Nonobstructing right renal calculus measuring 1 cm again noted. An indwelling right ureteral stent is coiled within the dilated right renal pelvis, and extends into the bladder lumen. A large concretion surrounds the distal aspect of the stent within the bladder lumen, measuring 3.1 cm, likely resulting in stent malfunction. There are multiple right ureteral calculi along the course of the right ureteral stent, largest measuring 1.1 cm. Stable small caliceal calculi within the upper pole left kidney measuring up to 7 mm. No left-sided obstruction. The bladder is decompressed, limiting its evaluation. The adrenals are unremarkable. Stomach/Bowel: No bowel obstruction or ileus. Extensive diverticulosis of the distal colon without diverticulitis. No bowel wall thickening or inflammatory change. Vascular/Lymphatic: Aortic atherosclerosis.  No enlarged abdominal or pelvic lymph nodes. Reproductive: Prostate is unremarkable. Other: No free fluid or free intraperitoneal gas. Stable fat containing right inguinal hernia. No bowel herniation. Musculoskeletal: No acute or destructive bony lesions. Reconstructed images demonstrate no additional findings. IMPRESSION: 1. Progressive severe right-sided hydronephrosis and hydroureter, likely due to malfunction of the indwelling right ureteral stent. A 3.1 cm concretion surrounding the distal margin of the right ureteral stent within the bladder lumen likely results in stent obstruction. 2. Bilateral nonobstructing renal calculi. 3. Multiple right ureteral calculi are seen along the course of the ureteral stent, largest measuring 1.1 cm. These are stable. 4. Distal colonic diverticulosis without diverticulitis. 5. Small hiatal hernia. 6.  Choose 1 Electronically Signed   By: Casimiro Needle  Manson Passey M.D.   On: 04/01/2021 22:06   CT ANGIO HEAD W OR WO CONTRAST  Addendum Date: 04/04/2021   ADDENDUM REPORT: 04/04/2021 15:55 ADDENDUM: CT head impression 1, CTA neck impression 1 and CTA head impressions 1, 2 and 6 called by telephone at the time of interpretation on 04/04/2021 at 3:50 pm to provider Dr. Denton Lank, who verbally acknowledged these results. Electronically Signed   By: Jackey Loge D.O.   On: 04/04/2021 15:55   Result Date: 04/04/2021 CLINICAL DATA:  Vertigo, central. Additional history provided: Sinus headache, vertigo, confusion. EXAM: CT ANGIOGRAPHY HEAD AND NECK TECHNIQUE: Multidetector CT imaging of the head and neck was performed using the standard protocol during bolus administration of intravenous contrast. Multiplanar CT image reconstructions and MIPs were obtained to evaluate the vascular anatomy. Carotid stenosis measurements (when applicable) are obtained utilizing NASCET criteria, using the distal internal carotid diameter as the denominator. CONTRAST:  60mL OMNIPAQUE IOHEXOL 350 MG/ML SOLN  COMPARISON:  Head CT 04/02/2021. FINDINGS: CT HEAD FINDINGS Brain: Mild generalized cerebral atrophy. Acute/subacute appearing cortical/subcortical infarct within the right parietooccipital lobes and callosal splenium (right PCA vascular territory). No significant mass effect at this time. No evidence of hemorrhagic conversion. 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 Background moderate patchy and ill-defined hypoattenuation within the cerebral white matter, nonspecific but compatible with chronic small vessel ischemic disease. There is no acute intracranial hemorrhage. No extra-axial fluid collection. No evidence of an intracranial mass. No midline shift. Vascular: Reported below. Skull: Normal. Negative for fracture or focal lesion. Sinuses: Postsurgical appearance of the paranasal sinuses. Small mucous retention cyst versus polypoid mucosal thickening within the left maxillary sinus. Orbits: No acute or significant orbital finding. Review of the MIP images confirms the above findings CTA NECK FINDINGS Aortic arch: Standard aortic branching. Atherosclerotic plaque within the proximal major branch vessels of the neck. No hemodynamically significant innominate or proximal subclavian artery stenosis. Right carotid system: CCA and ICA patent within the neck without stenosis. Minimal soft and calcified plaque within the CCA. Left carotid system: CCA and ICA patent within the neck without stenosis. Minimal atherosclerotic plaque about the carotid bifurcation. Vertebral arteries: The non dominant right vertebral artery becomes occluded at the C1-skull base level. The dominant left vertebral artery is patent within the neck without stenosis. Skeleton: Partially imaged thoracic dextrocurvature. Multilevel bridging ventrolateral osteophytes with relative preservation of the intervertebral disc spaces. 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 No acute bony abnormality or  aggressive osseous lesion. Other neck: 1.4 cm right thyroid lobe nodule, not meeting consensus criteria for ultrasound follow-up based on size. Upper chest: No consolidation within the imaged lung apices. Review of the MIP images confirms the above findings CTA HEAD FINDINGS Anterior circulation: The intracranial internal carotid arteries are patent. Calcified plaque within both vessels with no more than mild stenosis. There is a severe, near occlusive stenosis of the proximal to mid M1 left middle cerebral artery (series 14, image 57). The M1 right middle cerebral artery is patent with mild atherosclerotic irregularity. No M2 proximal branch occlusion or high-grade proximal stenosis is identified. The anterior cerebral arteries are patent. Moderate stenosis within the proximal right A1 segment. Posterior circulation: There is reconstitution of enhancement within the V4 right vertebral artery, likely due to retrograde flow. Enhancement is present within the proximal right PICA. Mild-to-moderate atherosclerotic narrowing of the V4 left vertebral artery. The basilar artery is patent. Severe stenosis of a right PCA branch at the P2/P3 junction. Occlusion of a right  PCA branch at the P3 segment. The left cerebral artery is fetal in origin and patent. However, there is a severe stenosis within the P2 left PCA. The right posterior communicating artery is diminutive or absent. Venous sinuses: Within the limitations of contrast timing, no convincing thrombus. Anatomic variants: As described. Review of the MIP images confirms the above findings IMPRESSION: CT head: 1. Acute/early subacute appearing right PCA territory cortical/subcortical infarct within the right parietooccipital lobes and callosal splenium. No significant mass effect at this time. No evidence of hemorrhagic conversion. 2. 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 3. Background moderate chronic small vessel ischemic changes within the cerebral  white matter. 4. Mild generalized parenchymal atrophy. CTA neck: 1. The non-dominant right vertebral artery becomes occluded at the C1-skull base level. 2. The dominant left vertebral artery is patent throughout the neck without stenosis. 3. Common and internal carotid arteries patent within the neck bilaterally without stenosis. 4. 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 CTA head: 1. Occlusion of a P3 segment right posterior cerebral artery branch. 2. Severe stenosis of a right posterior cerebral artery branch at the P2/P3 junction. 3. Severe stenosis within the P2 segment of the left posterior cerebral artery. 4. Reconstitution of enhancement within the intracranial right vertebral artery, likely due to retrograde flow. 5. Mild/moderate stenosis within the V4 left vertebral artery. 6. 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 7. Moderate stenosis of the right anterior cerebral artery A1 segment. 8. Atherosclerotic plaque within the intracranial internal carotid arteries bilaterally with no more than mild stenosis. Electronically Signed: By: Jackey Loge D.O. On: 04/04/2021 15:45   CT HEAD WO CONTRAST ( )  Result Date: 04/02/2021 CLINICAL DATA:  Delirium EXAM: CT HEAD WITHOUT CONTRAST TECHNIQUE: Contiguous axial images were obtained from the base of the skull through the vertex without intravenous contrast. COMPARISON:  None. FINDINGS: Brain: There is no mass, hemorrhage or extra-axial collection. There is generalized atrophy without lobar predilection. Hypodensity of the white matter is most commonly associated with chronic microvascular disease. 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 Vascular: No abnormal hyperdensity of the major intracranial arteries or dural venous sinuses. No intracranial atherosclerosis. Skull: The visualized skull base, calvarium and extracranial soft tissues are normal. Sinuses/Orbits: No fluid levels  or advanced mucosal thickening of the visualized paranasal sinuses. No mastoid or middle ear effusion. The orbits are normal. IMPRESSION: 1. No acute intracranial abnormality. 2. Generalized atrophy and chronic microvascular ischemia. Electronically Signed   By: Deatra Robinson M.D.   On: 04/02/2021 01:22   CT ANGIO NECK W OR WO CONTRAST  Addendum Date: 04/04/2021   ADDENDUM REPORT: 04/04/2021 15:55 ADDENDUM: CT head impression 1, CTA neck impression 1 and CTA head impressions 1, 2 and 6 called by telephone at the time of interpretation on 04/04/2021 at 3:50 pm to provider Dr. Denton Lank, who verbally acknowledged these results. Electronically Signed   By: Jackey Loge D.O.   On: 04/04/2021 15:55   Result Date: 04/04/2021 CLINICAL DATA:  Vertigo, central. Additional history provided: Sinus headache, vertigo, confusion. EXAM: CT ANGIOGRAPHY HEAD AND NECK TECHNIQUE: Multidetector CT imaging of the head and neck was performed using the standard protocol during bolus administration of intravenous contrast. Multiplanar CT image reconstructions and MIPs were obtained to evaluate the vascular anatomy. Carotid stenosis measurements (when applicable) are obtained utilizing NASCET criteria, using the distal internal carotid diameter as the denominator. CONTRAST:  26mL OMNIPAQUE IOHEXOL 350 MG/ML SOLN COMPARISON:  Head CT 04/02/2021. FINDINGS: CT HEAD FINDINGS Brain: Mild generalized cerebral atrophy. Acute/subacute appearing cortical/subcortical infarct within the right parietooccipital lobes and callosal splenium (right PCA vascular territory). No significant mass effect at this time. No evidence of hemorrhagic conversion. 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 Background moderate patchy and ill-defined hypoattenuation within the cerebral white matter, nonspecific but compatible with chronic small vessel ischemic disease. There is no acute intracranial hemorrhage. No extra-axial fluid collection. No  evidence of an intracranial mass. No midline shift. Vascular: Reported below. Skull: Normal. Negative for fracture or focal lesion. Sinuses: Postsurgical appearance of the paranasal sinuses. Small mucous retention cyst versus polypoid mucosal thickening within the left maxillary sinus. Orbits: No acute or significant orbital finding. Review of the MIP images confirms the above findings CTA NECK FINDINGS Aortic arch: Standard aortic branching. Atherosclerotic plaque within the proximal major branch vessels of the neck. No hemodynamically significant innominate or proximal subclavian artery stenosis. Right carotid system: CCA and ICA patent within the neck without stenosis. Minimal soft and calcified plaque within the CCA. Left carotid system: CCA and ICA patent within the neck without stenosis. Minimal atherosclerotic plaque about the carotid bifurcation. Vertebral arteries: The non dominant right vertebral artery becomes occluded at the C1-skull base level. The dominant left vertebral artery is patent within the neck without stenosis. Skeleton: Partially imaged thoracic dextrocurvature. Multilevel bridging ventrolateral osteophytes with relative preservation of the intervertebral disc spaces. 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 No acute bony abnormality or aggressive osseous lesion. Other neck: 1.4 cm right thyroid lobe nodule, not meeting consensus criteria for ultrasound follow-up based on size. Upper chest: No consolidation within the imaged lung apices. Review of the MIP images confirms the above findings CTA HEAD FINDINGS Anterior circulation: The intracranial internal carotid arteries are patent. Calcified plaque within both vessels with no more than mild stenosis. There is a severe, near occlusive stenosis of the proximal to mid M1 left middle cerebral artery (series 14, image 57). The M1 right middle cerebral artery is patent with mild atherosclerotic irregularity. No M2  proximal branch occlusion or high-grade proximal stenosis is identified. The anterior cerebral arteries are patent. Moderate stenosis within the proximal right A1 segment. Posterior circulation: There is reconstitution of enhancement within the V4 right vertebral artery, likely due to retrograde flow. Enhancement is present within the proximal right PICA. Mild-to-moderate atherosclerotic narrowing of the V4 left vertebral artery. The basilar artery is patent. Severe stenosis of a right PCA branch at the P2/P3 junction. Occlusion of a right PCA branch at the P3 segment. The left cerebral artery is fetal in origin and patent. However, there is a severe stenosis within the P2 left PCA. The right posterior communicating artery is diminutive or absent. Venous sinuses: Within the limitations of contrast timing, no convincing thrombus. Anatomic variants: As described. Review of the MIP images confirms the above findings IMPRESSION: CT head: 1. Acute/early subacute appearing right PCA territory cortical/subcortical infarct within the right parietooccipital lobes and callosal splenium. No significant mass effect at this time. No evidence of hemorrhagic conversion. 2. 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 3. Background moderate chronic small vessel ischemic changes within the cerebral white matter. 4. Mild generalized parenchymal atrophy. CTA neck: 1. The non-dominant right vertebral artery becomes occluded at the C1-skull base level. 2. The dominant left vertebral artery is patent throughout the neck without stenosis. 3. Common and internal carotid arteries patent within the neck bilaterally without stenosis. 4. Findings suggesting diffuse idiopathic  skeletal hyperostosis (DISH). CTA head: 1. Occlusion of a P3 segment right posterior cerebral artery branch. 2. Severe stenosis of a right posterior cerebral artery branch at the P2/P3 junction. 3. Severe stenosis within the P2 segment of the left posterior  cerebral artery. 4. Reconstitution of enhancement within the intracranial right vertebral artery, likely due to retrograde flow. 5. Mild/moderate stenosis within the V4 left vertebral artery. 6. 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 7. Moderate stenosis of the right anterior cerebral artery A1 segment. 8. Atherosclerotic plaque within the intracranial internal carotid arteries bilaterally with no more than mild stenosis. Electronically Signed: By: Jackey Loge D.O. On: 04/04/2021 15:45   MR BRAIN WO CONTRAST  Result Date: 04/05/2021 CLINICAL DATA:  Dizziness EXAM: MRI HEAD WITHOUT CONTRAST TECHNIQUE: Multiplanar, multiecho pulse sequences of the brain and surrounding structures were obtained without intravenous contrast. COMPARISON:  None. FINDINGS: Brain: Multifocal abnormal diffusion restriction within both cerebral hemispheres right worse than left, in the left middle cerebellar peduncle and the inferior left cerebellum. No acute or chronic hemorrhage. Hyperintense T2-weighted signal is moderately widespread throughout the white matter. Generalized volume loss without a clear lobar predilection. The midline structures are normal. Vascular: Major flow voids are preserved. Skull and upper cervical spine: Normal calvarium and skull base. Visualized upper cervical spine and soft tissues are normal. Sinuses/Orbits:No paranasal sinus fluid levels or advanced mucosal thickening. No mastoid or middle ear effusion. Normal orbits. 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 Ischemia in the left middle cerebellar peduncle and inferior left cerebellum may be slightly older but is still in the recent subacute phase. No hemorrhage or mass effect. Electronically Signed   By: Deatra Robinson M.D.   On: 04/05/2021 03:19   ECHOCARDIOGRAM COMPLETE  Result Date: 04/05/2021    ECHOCARDIOGRAM REPORT   Patient Name:   Nicholas Mckenzie  Date of Exam:  04/05/2021 Medical Rec #:  [id number]  Height:       68.0 in Accession #:    [id number] Weight:       208.2 lb Date of Birth:  08/20/1945 BSA:          2.079 m Patient Age:    74 years   BP:           148/87 mmHg Patient Gender: M          HR:           82 bpm. Exam Location:  Inpatient Procedure: 2D Echo, Color Doppler and Cardiac Doppler Indications:    Stroke i63.9  History:        Patient has no prior history of Echocardiogram examinations.                 Risk Factors:Hypertension and Sleep Apnea.  Sonographer:    Alvera Novel Referring Phys: [id number] [facility] KHALIQDINA IMPRESSIONS  1. Consider further imaging of the aorta with CTA given enlargement.  2. Left ventricular ejection fraction, by estimation, is 60 to 65%. The left ventricle has normal function. The left ventricle has no regional wall motion abnormalities. There is mild left ventricular hypertrophy. Left ventricular diastolic parameters were normal.  3. Right ventricular systolic function is normal. The right ventricular size is normal. There is normal pulmonary artery systolic pressure.  4. Left atrial size was moderately dilated.  5. The mitral valve is normal in structure. No evidence of mitral valve regurgitation. No evidence of mitral stenosis.  6. The aortic valve is tricuspid.  Aortic valve regurgitation is not visualized. No aortic stenosis is present.  7. Aortic dilatation noted. There is mild dilatation of the aortic root, measuring 39 mm. 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  8. The inferior vena cava is normal in size with greater than 50% respiratory variability, suggesting right atrial pressure of 3 mmHg. FINDINGS  Left Ventricle: Left ventricular ejection fraction, by estimation, is 60 to 65%. The left ventricle has normal function. The left ventricle has no regional wall motion abnormalities. The left ventricular internal cavity size was normal in size. There is  mild left ventricular hypertrophy. Left  ventricular diastolic parameters were normal. Right Ventricle: The right ventricular size is normal. No increase in right ventricular wall thickness. Right ventricular systolic function is normal. There is normal pulmonary artery systolic pressure. The tricuspid regurgitant velocity is 2.03 m/s, and  with an assumed right atrial pressure of 8 mmHg, the estimated right ventricular systolic pressure is 24.5 mmHg. Left Atrium: Left atrial size was moderately dilated. Right Atrium: Right atrial size was normal in size. Pericardium: There is no evidence of pericardial effusion. Mitral Valve: The mitral valve is normal in structure. No evidence of mitral valve regurgitation. No evidence of mitral valve stenosis. Tricuspid Valve: The tricuspid valve is normal in structure. Tricuspid valve regurgitation is not demonstrated. No evidence of tricuspid stenosis. Aortic Valve: The aortic valve is tricuspid. Aortic valve regurgitation is not visualized. No aortic stenosis is present. Pulmonic Valve: The pulmonic valve was normal in structure. Pulmonic valve regurgitation is not visualized. No evidence of pulmonic stenosis. Aorta: Aortic dilatation noted. There is mild dilatation of the aortic root, measuring 39 mm. 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 Venous: The inferior vena cava is normal in size with greater than 50% respiratory variability, suggesting right atrial pressure of 3 mmHg. IAS/Shunts: No atrial level shunt detected by color flow Doppler. Additional Comments: Consider further imaging of the aorta with CTA given enlargement.  LEFT VENTRICLE PLAX 2D LVIDd:         4.80 cm   Diastology LVIDs:         3.30 cm   LV e' medial:    7.18 cm/s LV PW:         1.20 cm   LV E/e' medial:  7.7 LV IVS:        1.20 cm   LV e' lateral:   14.70 cm/s LVOT diam:     2.20 cm   LV E/e' lateral: 3.8 LV SV:         105 LV SV Index:   50 LVOT Area:     3.80 cm  RIGHT VENTRICLE RV S prime:     22.80 cm/s TAPSE  (M-mode): 3.0 cm LEFT ATRIUM             Index        RIGHT ATRIUM           Index LA diam:        4.50 cm 2.16 cm/m   RA Area:     19.80 cm LA Vol (A2C):   56.7 ml 27.27 ml/m  RA Volume:   55.90 ml  26.89 ml/m LA Vol (A4C):   62.8 ml 30.21 ml/m LA Biplane Vol: 61.3 ml 29.49 ml/m  AORTIC VALVE LVOT Vmax:   148.00 cm/s LVOT Vmean:  96.000 cm/s LVOT VTI:    0.276 m  AORTA Ao Root diam: 3.90 cm Ao  Asc diam:  4.50 cm MITRAL VALVE               TRICUSPID VALVE MV Area (PHT): 2.09 cm    TR Peak grad:   16.5 mmHg MV Decel Time: 363 msec    TR Vmax:        203.00 cm/s MV E velocity: 55.50 cm/s MV A velocity: 72.30 cm/s  SHUNTS MV E/A ratio:  0.77        Systemic VTI:  0.28 m                            Systemic Diam: 2.20 cm Charlton Haws MD Electronically signed by Charlton Haws MD Signature Date/Time: 04/05/2021/4:20:55 PM    Final    IR NEPHROSTOMY PLACEMENT RIGHT  Result Date: 04/07/2021 INDICATION: 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 As such, request made for placement of a right-sided percutaneous nephrostomy catheter for infection source control purposes. EXAM: ULTRASOUND AND FLUOROSCOPIC GUIDED PLACEMENT OF LEFT NEPHROSTOMY TUBE COMPARISON:  None. MEDICATIONS: Ciprofloxacin 400 mg IV; The antibiotic was administered in an appropriate time frame prior to skin puncture. ANESTHESIA/SEDATION: Moderate (conscious) sedation was employed during this procedure as administered by the Interventional Radiology RN. A total of Versed 1.5 mg and Fentanyl 75 mcg was administered intravenously. Moderate Sedation Time: 32 minutes. The patient's level of consciousness and vital signs were monitored continuously by radiology nursing throughout the procedure under my direct  supervision. CONTRAST:  10 cc mL Isovue 300 - administered into the renal collecting system FLUOROSCOPY TIME:  18 seconds (8 mGy) COMPLICATIONS: None immediate. PROCEDURE: The procedure, risks, benefits, and alternatives were explained to the patient, questions were encouraged and answered and informed consent was obtained. A timeout was performed prior to the initiation of the procedure. The operative site was prepped and draped in the usual sterile fashion and a sterile drape was applied covering the operative field. A sterile gown and sterile gloves were used for the procedure. Local anesthesia was provided with 1% Lidocaine with epinephrine. Ultrasound was used to localize the right kidney. Under direct ultrasound guidance, a 20 gauge needle was advanced into the renal collecting system. An ultrasound image documentation was performed. Access within the collecting system was confirmed with the efflux of purulent urine followed by limited contrast injection. Under intermittent fluoroscopic guidance, an 0.018 wire was advanced into the collecting system and the tract was dilated with an Accustick stent. Next, over a short Amplatz wire, the track was further dilated ultimately allowing placement of a 14-French percutaneous nephrostomy catheter with end coiled and locked within the renal pelvis. Following nephrostomy catheter placement, 3 L of purulent urine was aspirated. A small amount of aspirated purulent urine was capped and sent to the laboratory for analysis. A postprocedural spot fluoroscopic images were obtained in various obliquities. The catheter was secured at the skin entrance site with an interrupted suture and a stat lock device and connected to a gravity bag. Dressings were applied. The patient tolerated procedure well without immediate postprocedural complication. FINDINGS: Ultrasound scanning demonstrates a severe dilatation of the right kidney without any identifiable renal parenchyma. Under a  combination of ultrasound fluoroscopic guidance, the right renal pelvis was accessed allowing placement of a 14 French percutaneous nephrostomy catheter. IMPRESSION: Successful  ultrasound and fluoroscopic guided placement of a right sided 14 French PCN yielding 3 L of purulent urine. A small amount of aspirated purulent urine was capped and sent to the laboratory for analysis. 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 Routine nephrostomy catheter exchange may be performed in 6-8 weeks unless nephroureteral intervention is pursued in the interval. Electronically Signed   By: Simonne Come M.D.   On: 04/07/2021 15:50    Labs: BNP (last 3 results) No results for input(s): BNP in the last 8760 hours. Basic Metabolic Panel: Recent Labs  Lab 04/08/21 0503 04/09/21 0452 04/10/21 0553 04/12/21 0459 04/14/21 0618  NA 139 137 133* 137 140  K 4.2 4.3 4.1 4.0 4.1  CL 108 107 104 106 108  CO2 22 21* 23 24 25   GLUCOSE 113* 118* 111* 102* 102*  BUN 30* 26* 23 24* 21  CREATININE 1.46* 1.38* 1.40* 1.36* 1.30*  CALCIUM 8.5* 8.4* 8.4* 8.8* 9.1  MG  --  2.2  --  2.3  --   PHOS  --  4.4  --   --   --    Liver Function Tests: No results for input(s): AST, ALT, ALKPHOS, BILITOT, PROT, ALBUMIN in the last 168 hours. No results for input(s): LIPASE, AMYLASE in the last 168 hours. No results for input(s): AMMONIA in the last 168 hours. CBC: Recent Labs  Lab 04/08/21 0503 04/09/21 0452 04/10/21 0553 04/12/21 0459 04/14/21 0618  WBC 17.2* 16.0* 13.3* 11.2* 9.9  HGB 11.0* 10.7* 10.4* 10.4* 10.2*  HCT 34.3* 32.6* 32.3* 33.3* 32.7*  MCV 88.4 87.9 87.1 89.0 89.1  PLT 325 309 338 391 439*   Cardiac Enzymes: No results for input(s): CKTOTAL, CKMB, CKMBINDEX, TROPONINI in the last 168 hours. BNP: Invalid input(s): POCBNP CBG: No results for input(s): GLUCAP in the last  168 hours. D-Dimer No results for input(s): DDIMER in the last 72 hours. Hgb A1c No results for input(s): HGBA1C in the last 72 hours. Lipid Profile No results for input(s): CHOL, HDL, LDLCALC, TRIG, CHOLHDL, LDLDIRECT in the last 72 hours. Thyroid function studies No results for input(s): TSH, T4TOTAL, T3FREE, THYROIDAB in the last 72 hours.  Invalid input(s): FREET3 Anemia work up No results for input(s): VITAMINB12, FOLATE, FERRITIN, TIBC, IRON, RETICCTPCT in the last 72 hours. Urinalysis    Component Value Date/Time   COLORURINE YELLOW 04/02/2021 0230   APPEARANCEUR HAZY (A) 04/02/2021 0230   LABSPEC 1.016 04/02/2021 0230   PHURINE 5.0 04/02/2021 0230   GLUCOSEU NEGATIVE 04/02/2021 0230   HGBUR NEGATIVE 04/02/2021 0230   BILIRUBINUR NEGATIVE 04/02/2021 0230   KETONESUR 5 (A) 04/02/2021 0230   PROTEINUR 30 (A) 04/02/2021 0230   NITRITE NEGATIVE 04/02/2021 0230   LEUKOCYTESUR LARGE (A) 04/02/2021 0230   Sepsis Labs Invalid input(s): PROCALCITONIN,  WBC,  LACTICIDVEN Microbiology Recent Results (from the past 240 hour(s))  Aerobic/Anaerobic Culture w Gram Stain (surgical/deep wound)     Status: None   Collection Time: 04/07/21 12:56 PM   Specimen: Abscess  Result Value Ref Range Status   Specimen Description ABSCESS  Final   Special Requests POST RIGHT SIDED PCN PLACEMENT  Final   Gram Stain   Final    ABUNDANT WBC PRESENT, PREDOMINANTLY PMN RARE GRAM POSITIVE COCCI    Culture   Final    RARE ENTEROBACTER CLOACAE NO ANAEROBES ISOLATED Performed at [facility]  Hospital Lab, [address], [place], [place] [address]    Report Status 04/12/2021 FINAL  Final   Organism ID, Bacteria ENTEROBACTER CLOACAE  Final      Susceptibility   Enterobacter cloacae - MIC*    CEFAZOLIN >=64 RESISTANT Resistant     CEFEPIME <=0.12 SENSITIVE Sensitive     CEFTAZIDIME <=1 SENSITIVE Sensitive     CIPROFLOXACIN <=0.25 SENSITIVE Sensitive     GENTAMICIN <=1 SENSITIVE Sensitive     IMIPENEM  <=0.25 SENSITIVE Sensitive     TRIMETH/SULFA >=320 RESISTANT Resistant     PIP/TAZO <=4 SENSITIVE Sensitive     * RARE ENTEROBACTER CLOACAE  SARS CORONAVIRUS 2 (TAT 6-24 HRS) Nasopharyngeal Nasopharyngeal Swab     Status: None   Collection Time: 04/12/21 11:07 AM   Specimen: Nasopharyngeal Swab  Result Value Ref Range Status   SARS Coronavirus 2 NEGATIVE NEGATIVE Final    Comment: (NOTE) SARS-CoV-2 target nucleic acids are NOT DETECTED.  The SARS-CoV-2 RNA is generally detectable in upper and lower respiratory specimens during the acute phase of infection. Negative results do not preclude SARS-CoV-2 infection, do not rule out co-infections with other pathogens, and should not be used as the sole basis for treatment or other patient management decisions. Negative results must be combined with clinical observations, patient history, and epidemiological information. The expected result is Negative.  Fact Sheet for Patients: HairSlick.no  Fact Sheet for Healthcare Providers: quierodirigir.com  This test is not yet approved or cleared by the [place] FDA and  has been authorized for detection and/or diagnosis of SARS-CoV-2 by FDA under an Emergency Use Authorization (EUA). This EUA will remain  in effect (meaning this test can be used) for the duration of the COVID-19 declaration under Se ction 564(b)(1) of the Act, 21 U.S.C. section 360bbb-3(b)(1), unless the authorization is terminated or revoked sooner.  Performed at [facility], [address], [place], [place] [address]      Time coordinating discharge: 35 minutes  SIGNED: Lanae Boast, MD  [facility] 04/14/2021, 9:46 AM  If 7PM-7AM, please contact night-coverage [email or web address]

## 2021-04-13 NOTE — TOC Progression Note (Signed)
Transition of Care (TOC) - Progression Note  Donn Pierini RN, BSN Transitions of Care Unit 4E- RN Case Manager See Treatment Team for direct phone #    Patient Details  Name: Nicholas Mckenzie MRN: [id number] Date of Birth: 12/04/1945  Transition of Care [facility]) CM/SW Contact  Zenda Alpers, Lenn Sink, RN Phone Number: 04/13/2021, 2:53 PM  Clinical Narrative:    Have spoken with [facility]- they have insurance auth and have spoken with wife who is agreeable to accept bed offer. Per Thurston Hole at [facility] they will have bed available tomorrow for pt to transition to their facility. Thurston Hole is requesting transport be set up for pt between 10-11 am.   CM spoke with wife at the bedside as well as pt- who are both agreeable to transition tomorrow to [facility]- info has been provided to wife regarding address and visitation to [facility] facility.   Call made to [facility] transport- and pt has been scheduled for transport tomorrow AM- pickup time 1030am.   Anne with [facility] will call in the am with room # and report # for staff to call report prior to transport.     Expected Discharge Plan: IP Rehab Facility Barriers to Discharge: Barriers Resolved  Expected Discharge Plan and Services Expected Discharge Plan: IP Rehab Facility In-house Referral: Clinical Social Work Discharge Planning Services: CM Consult Post Acute Care Choice: IP Rehab Living arrangements for the past 2 months: Single Family Home Expected Discharge Date: 04/14/21               DME Arranged: N/A DME Agency: NA       HH Arranged: PT HH Agency: [facility] Date HH Agency Contacted: 04/04/21 Time HH Agency Contacted: 1400 Representative spoke with at [facility] Agency: Pearson Grippe   Social Determinants of Health (SDOH) Interventions    Readmission Risk Interventions No flowsheet data found.

## 2021-04-13 NOTE — Care Management Important Message (Signed)
Important Message  Patient Details  Name: Nicholas Mckenzie MRN: [id number] Date of Birth: [birth year]   Medicare Important Message Given:  Yes     Renie Ora 04/13/2021, 10:48 AM

## 2021-04-14 DIAGNOSIS — I639 Cerebral infarction, unspecified: Secondary | ICD-10-CM | POA: Insufficient documentation

## 2021-04-14 DIAGNOSIS — N289 Disorder of kidney and ureter, unspecified: Secondary | ICD-10-CM

## 2021-04-14 LAB — CBC
HCT: 32.7 % — ABNORMAL LOW (ref 39.0–52.0)
Hemoglobin: 10.2 g/dL — ABNORMAL LOW (ref 13.0–17.0)
MCH: 27.8 pg (ref 26.0–34.0)
MCHC: 31.2 g/dL (ref 30.0–36.0)
MCV: 89.1 fL (ref 80.0–100.0)
Platelets: 439 10*3/uL — ABNORMAL HIGH (ref 150–400)
RBC: 3.67 MIL/uL — ABNORMAL LOW (ref 4.22–5.81)
RDW: 15.4 % (ref 11.5–15.5)
WBC: 9.9 10*3/uL (ref 4.0–10.5)
nRBC: 0 % (ref 0.0–0.2)

## 2021-04-14 LAB — BASIC METABOLIC PANEL
Anion gap: 7 (ref 5–15)
BUN: 21 mg/dL (ref 8–23)
CO2: 25 mmol/L (ref 22–32)
Calcium: 9.1 mg/dL (ref 8.9–10.3)
Chloride: 108 mmol/L (ref 98–111)
Creatinine, Ser: 1.3 mg/dL — ABNORMAL HIGH (ref 0.61–1.24)
GFR, Estimated: 58 mL/min — ABNORMAL LOW (ref >60)
Glucose, Bld: 102 mg/dL — ABNORMAL HIGH (ref 70–99)
Potassium: 4.1 mmol/L (ref 3.5–5.1)
Sodium: 140 mmol/L (ref 135–145)

## 2021-04-14 NOTE — Progress Notes (Signed)
Physical Therapy Treatment Patient Details Name: Nicholas Mckenzie MRN: [id number] DOB: [birth year] Today's Date: 04/14/2021   History of Present Illness Pt is a 75 y.o. male admitted to [facility] on 04/01/21 with c/o dizziness, weakness. Workup for UTI, nephrolithiasis, and obstructed right ureteral stent s/p percutaneous nephrostomy on 10/14. Physical Therapy noted vestibular dysfunction indicating central lesion on 10/11; brain MRI 10/12 with multifocal acute ischemia both cerebral hemispheres (R>L), L middle cerebellar peduncle and inferior L cerebellum ischemia.    PT Comments    The pt was able to make great progress with ambulation distance and directing RW when given cues for visual scanning. The pt was able to self-monitor for exertion, but completed ~40 ft hallway ambulation with RW and frequent standing rest breaks as well as cues for visual scanning to improve R drifting. He continues to utilize ataxic gait with inconsistent stepping of LLE that requires up to modA to steady. Continue to recommend intensive therapies at d/c to maximize functional recovery.     Recommendations for follow up therapy are one component of a multi-disciplinary discharge planning process, led by the attending physician.  Recommendations may be updated based on patient status, additional functional criteria and insurance authorization.  Follow Up Recommendations  CIR     Equipment Recommendations  Rolling walker with 5" wheels;Wheelchair cushion (measurements PT);Wheelchair (measurements PT)    Recommendations for Other Services       Precautions / Restrictions Precautions Precautions: Fall Precaution Comments: dizzness, nausea Restrictions Weight Bearing Restrictions: No     Mobility  Bed Mobility Overal bed mobility: Needs Assistance Bed Mobility: Supine to Sit     Supine to sit: Min guard     General bed mobility comments: minG with increased cues and time    Transfers Overall transfer level:  Needs assistance Equipment used: Rolling walker (2 wheeled) Transfers: Sit to/from [employer] Sit to Stand: Min assist Stand pivot transfers: Mod assist       General transfer comment: minG-minA to rise with significantly increased time to power up. max cues for positioning of feet and UE. modA with repeated cues and physical assist to RW to manage turning in tight area  Ambulation/Gait Ambulation/Gait assistance: Min assist;Mod assist Gait Distance (Feet): 40 Feet (+ 15 ft) Assistive device: Rolling walker (2 wheeled) Gait Pattern/deviations: Step-to pattern;Decreased step length - left;Decreased stance time - right;Decreased stride length;Ataxic;Drifts right/left Gait velocity: decreased Gait velocity interpretation: <1.31 ft/sec, indicative of household ambulator General Gait Details: frequent drifting to R without cues to visually scan. inconsistent stepping width, clearance, and length with LLE. minG to modA to steady and manage RW positioning as pt leaning to R.      Modified Rankin (Stroke Patients Only) Modified Rankin (Stroke Patients Only) Pre-Morbid Rankin Score: Slight disability Modified Rankin: Moderately severe disability     Balance Overall balance assessment: Needs assistance;History of Falls Sitting-balance support: Feet supported;Bilateral upper extremity supported Sitting balance-Leahy Scale: Fair Sitting balance - Comments: able to static sit without UE support Postural control: Right lateral lean Standing balance support: Bilateral upper extremity supported Standing balance-Leahy Scale: Poor Standing balance comment: reliant on BUE and external support                            Cognition Arousal/Alertness: Awake/alert Behavior During Therapy: Flat affect;WFL for tasks assessed/performed Overall Cognitive Status: Impaired/Different from baseline Area of Impairment: Attention;Following commands;Problem solving  Current Attention Level: Focused   Following Commands: Follows one step commands consistently;Follows multi-step commands inconsistently;Follows one step commands with increased time     Problem Solving: Slow processing;Requires verbal cues;Requires tactile cues;Difficulty sequencing General Comments: increased time for processing all cues, but able to at least initiate movements with simple cues. repeated cues to complete tasks, even in calm environment of room. increased cues in hallway to maintain taks         General Comments General comments (skin integrity, edema, etc.): VSS on RA      Pertinent Vitals/Pain Pain Assessment: No/denies pain Faces Pain Scale: No hurt Pain Intervention(s): Monitored during session     PT Goals (current goals can now be found in the care plan section) Acute Rehab PT Goals PT Goal Formulation: With patient/family Time For Goal Achievement: 04/17/21 Potential to Achieve Goals: Good Progress towards PT goals: Progressing toward goals    Frequency    Min 4X/week      PT Plan Current plan remains appropriate       AM-PAC PT "6 Clicks" Mobility   Outcome Measure  Help needed turning from your back to your side while in a flat bed without using bedrails?: A Little Help needed moving from lying on your back to sitting on the side of a flat bed without using bedrails?: A Little Help needed moving to and from a bed to a chair (including a wheelchair)?: A Lot Help needed standing up from a chair using your arms (e.g., wheelchair or bedside chair)?: A Little Help needed to walk in hospital room?: A Lot Help needed climbing 3-5 steps with a railing? : Total 6 Click Score: 14    End of Session Equipment Utilized During Treatment: Gait belt Activity Tolerance: Patient tolerated treatment well Patient left: in chair;with call bell/phone within reach;with chair alarm set;with family/visitor present Nurse Communication: Mobility  status PT Visit Diagnosis: Muscle weakness (generalized) (M62.81);Difficulty in walking, not elsewhere classified (R26.2);Ataxic gait (R26.0)     Time: 8756-4332 PT Time Calculation (min) (ACUTE ONLY): 40 min  Charges:  $Gait Training: 23-37 mins $Therapeutic Activity: 8-22 mins                     Vickki Muff, PT, DPT   Acute Rehabilitation Department Pager #: [phone]   Ronnie Derby 04/14/2021, 8:57 AM

## 2021-04-14 NOTE — Progress Notes (Signed)
  Seem this am 04/14/21- vitals table., aaox3. Unchanged  clinical status- he is stable for d/c to [facility] today Dc summary updated Wife at bedside agreeable

## 2021-04-14 NOTE — TOC Transition Note (Signed)
Transition of Care (TOC) - CM/SW Discharge Note Donn Pierini RN, BSN Transitions of Care Unit 4E- RN Case Manager See Treatment Team for direct phone #    Patient Details  Name: Amear Strojny MRN: [id number] Date of Birth: [birth year]  [facility] CM/SW Contact:  Darrold Span, RN Phone Number: 04/14/2021, 10:03 AM   Clinical Narrative:    Pt stable for transition to [facility], D/C summary has been faxed to [facility] on 10/20. Spoke with [facility] this AM and confirmed they are ready for pt to transition to them today- tentative room assignment is [id number].   Number for staff to call report to [facility]- [phone]  [facility] transport has been scheduled for transport- pickup time 1030-1100. Paperwork has been placed on shadow chart.    Final next level of care: IP Rehab Facility Barriers to Discharge: Barriers Resolved   Patient Goals and CMS Choice Patient states their goals for this hospitalization and ongoing recovery are:: rehab then be able to return home CMS Medicare.gov Compare Post Acute Care list provided to:: Patient Represenative (must comment) Choice offered to / list presented to : Spouse  Discharge Placement               [facility].         Discharge Plan and Services In-house Referral: Clinical Social Work Discharge Planning Services: CM Consult Post Acute Care Choice: IP Rehab          DME Arranged: N/A DME Agency: NA       HH Arranged: PT HH Agency: [facility] Date HH Agency Contacted: 04/04/21 Time HH Agency Contacted: 1400 Representative spoke with at [facility] Agency: Pearson Grippe  Social Determinants of Health (SDOH) Interventions     Readmission Risk Interventions No flowsheet data found.

## 2021-04-14 NOTE — Progress Notes (Signed)
Report given to [facility] with [facility] facility. IV removed and CCMD notified that Tele was removed. Belongings packed by wife. Awaiting transport by [employer]

## 2021-06-02 ENCOUNTER — Other Ambulatory Visit (HOSPITAL_COMMUNITY): Payer: Self-pay | Admitting: Interventional Radiology

## 2021-06-02 ENCOUNTER — Ambulatory Visit (HOSPITAL_COMMUNITY)
Admission: RE | Admit: 2021-06-02 | Discharge: 2021-06-02 | Disposition: A | Discharge status: Home / Self Care | Payer: Medicare Other | Source: Ambulatory Visit | Attending: Interventional Radiology | Admitting: Interventional Radiology

## 2021-06-02 ENCOUNTER — Other Ambulatory Visit: Payer: Self-pay

## 2021-06-02 ENCOUNTER — Encounter (HOSPITAL_COMMUNITY): Payer: Self-pay | Admitting: Radiology

## 2021-06-02 DIAGNOSIS — N1339 Other hydronephrosis: Secondary | ICD-10-CM

## 2021-06-02 HISTORY — PX: IR PATIENT EVAL TECH 0-60 MINS: IMG5564

## 2021-06-02 NOTE — Procedures (Signed)
Nicholas Mckenzie here in Interventional Radiology to have leaking drainage bag replaced.  Appt time given to pt and his wife for nephrostomy tube replacement.  Relayed care information to Dr Bryn Gulling

## 2021-06-06 ENCOUNTER — Encounter (HOSPITAL_BASED_OUTPATIENT_CLINIC_OR_DEPARTMENT_OTHER): Payer: Self-pay | Admitting: Emergency Medicine

## 2021-06-06 ENCOUNTER — Other Ambulatory Visit: Payer: Self-pay

## 2021-06-06 ENCOUNTER — Emergency Department (HOSPITAL_BASED_OUTPATIENT_CLINIC_OR_DEPARTMENT_OTHER): Payer: Medicare Other

## 2021-06-06 ENCOUNTER — Emergency Department (HOSPITAL_BASED_OUTPATIENT_CLINIC_OR_DEPARTMENT_OTHER): Payer: Medicare Other | Admitting: Radiology

## 2021-06-06 ENCOUNTER — Inpatient Hospital Stay (HOSPITAL_BASED_OUTPATIENT_CLINIC_OR_DEPARTMENT_OTHER)
Admission: EM | Admit: 2021-06-06 | Discharge: 2021-06-08 | DRG: 082 | Disposition: A | Discharge status: Home Health | Payer: Medicare Other | Attending: Internal Medicine | Admitting: Internal Medicine

## 2021-06-06 DIAGNOSIS — R4182 Altered mental status, unspecified: Secondary | ICD-10-CM

## 2021-06-06 DIAGNOSIS — G4730 Sleep apnea, unspecified: Secondary | ICD-10-CM

## 2021-06-06 DIAGNOSIS — E66811 Obesity, class 1: Secondary | ICD-10-CM | POA: Diagnosis present

## 2021-06-06 DIAGNOSIS — W06XXXA Fall from bed, initial encounter: Secondary | ICD-10-CM | POA: Diagnosis present

## 2021-06-06 DIAGNOSIS — F32A Depression, unspecified: Secondary | ICD-10-CM | POA: Diagnosis present

## 2021-06-06 DIAGNOSIS — I10 Essential (primary) hypertension: Secondary | ICD-10-CM | POA: Diagnosis not present

## 2021-06-06 DIAGNOSIS — S0990XA Unspecified injury of head, initial encounter: Secondary | ICD-10-CM

## 2021-06-06 DIAGNOSIS — E876 Hypokalemia: Secondary | ICD-10-CM | POA: Diagnosis present

## 2021-06-06 DIAGNOSIS — Z87442 Personal history of urinary calculi: Secondary | ICD-10-CM

## 2021-06-06 DIAGNOSIS — Z66 Do not resuscitate: Secondary | ICD-10-CM | POA: Diagnosis present

## 2021-06-06 DIAGNOSIS — I69354 Hemiplegia and hemiparesis following cerebral infarction affecting left non-dominant side: Secondary | ICD-10-CM | POA: Diagnosis not present

## 2021-06-06 DIAGNOSIS — R350 Frequency of micturition: Secondary | ICD-10-CM

## 2021-06-06 DIAGNOSIS — N183 Chronic kidney disease, stage 3 unspecified: Secondary | ICD-10-CM | POA: Diagnosis present

## 2021-06-06 DIAGNOSIS — N136 Pyonephrosis: Secondary | ICD-10-CM | POA: Diagnosis present

## 2021-06-06 DIAGNOSIS — T83192A Other mechanical complication of urinary stent, initial encounter: Secondary | ICD-10-CM | POA: Diagnosis present

## 2021-06-06 DIAGNOSIS — D631 Anemia in chronic kidney disease: Secondary | ICD-10-CM | POA: Diagnosis present

## 2021-06-06 DIAGNOSIS — I129 Hypertensive chronic kidney disease with stage 1 through stage 4 chronic kidney disease, or unspecified chronic kidney disease: Secondary | ICD-10-CM | POA: Diagnosis present

## 2021-06-06 DIAGNOSIS — Z8673 Personal history of transient ischemic attack (TIA), and cerebral infarction without residual deficits: Secondary | ICD-10-CM

## 2021-06-06 DIAGNOSIS — Z6830 Body mass index (BMI) 30.0-30.9, adult: Secondary | ICD-10-CM

## 2021-06-06 DIAGNOSIS — E669 Obesity, unspecified: Secondary | ICD-10-CM | POA: Diagnosis present

## 2021-06-06 DIAGNOSIS — Z7902 Long term (current) use of antithrombotics/antiplatelets: Secondary | ICD-10-CM | POA: Diagnosis not present

## 2021-06-06 DIAGNOSIS — Z79890 Hormone replacement therapy (postmenopausal): Secondary | ICD-10-CM | POA: Diagnosis not present

## 2021-06-06 DIAGNOSIS — G9341 Metabolic encephalopathy: Secondary | ICD-10-CM | POA: Diagnosis present

## 2021-06-06 DIAGNOSIS — Z7982 Long term (current) use of aspirin: Secondary | ICD-10-CM | POA: Diagnosis not present

## 2021-06-06 DIAGNOSIS — U071 COVID-19: Secondary | ICD-10-CM | POA: Diagnosis present

## 2021-06-06 DIAGNOSIS — D649 Anemia, unspecified: Secondary | ICD-10-CM | POA: Diagnosis present

## 2021-06-06 DIAGNOSIS — G9340 Encephalopathy, unspecified: Secondary | ICD-10-CM | POA: Diagnosis present

## 2021-06-06 DIAGNOSIS — N1831 Chronic kidney disease, stage 3a: Secondary | ICD-10-CM | POA: Diagnosis present

## 2021-06-06 DIAGNOSIS — N390 Urinary tract infection, site not specified: Secondary | ICD-10-CM | POA: Diagnosis present

## 2021-06-06 DIAGNOSIS — G4733 Obstructive sleep apnea (adult) (pediatric): Secondary | ICD-10-CM | POA: Diagnosis present

## 2021-06-06 DIAGNOSIS — I7121 Aneurysm of the ascending aorta, without rupture: Secondary | ICD-10-CM | POA: Diagnosis present

## 2021-06-06 DIAGNOSIS — Y92009 Unspecified place in unspecified non-institutional (private) residence as the place of occurrence of the external cause: Secondary | ICD-10-CM

## 2021-06-06 DIAGNOSIS — Z888 Allergy status to other drugs, medicaments and biological substances status: Secondary | ICD-10-CM | POA: Diagnosis not present

## 2021-06-06 DIAGNOSIS — F419 Anxiety disorder, unspecified: Secondary | ICD-10-CM | POA: Diagnosis present

## 2021-06-06 DIAGNOSIS — N261 Atrophy of kidney (terminal): Secondary | ICD-10-CM | POA: Diagnosis present

## 2021-06-06 DIAGNOSIS — N3000 Acute cystitis without hematuria: Secondary | ICD-10-CM | POA: Diagnosis not present

## 2021-06-06 DIAGNOSIS — Z8249 Family history of ischemic heart disease and other diseases of the circulatory system: Secondary | ICD-10-CM | POA: Diagnosis not present

## 2021-06-06 DIAGNOSIS — Z79899 Other long term (current) drug therapy: Secondary | ICD-10-CM

## 2021-06-06 DIAGNOSIS — S065XAA Traumatic subdural hemorrhage with loss of consciousness status unknown, initial encounter: Principal | ICD-10-CM | POA: Diagnosis present

## 2021-06-06 DIAGNOSIS — K5900 Constipation, unspecified: Secondary | ICD-10-CM | POA: Diagnosis present

## 2021-06-06 DIAGNOSIS — R509 Fever, unspecified: Secondary | ICD-10-CM

## 2021-06-06 DIAGNOSIS — N1832 Chronic kidney disease, stage 3b: Secondary | ICD-10-CM | POA: Diagnosis present

## 2021-06-06 DIAGNOSIS — N289 Disorder of kidney and ureter, unspecified: Secondary | ICD-10-CM

## 2021-06-06 HISTORY — DX: Traumatic subdural hemorrhage with loss of consciousness status unknown, initial encounter: S06.5XAA

## 2021-06-06 HISTORY — DX: COVID-19: U07.1

## 2021-06-06 LAB — LACTIC ACID, PLASMA: Lactic Acid, Venous: 1.1 mmol/L (ref 0.5–1.9)

## 2021-06-06 LAB — CBC WITH DIFFERENTIAL/PLATELET
Abs Immature Granulocytes: 0.03 10*3/uL (ref 0.00–0.07)
Basophils Absolute: 0 10*3/uL (ref 0.0–0.1)
Basophils Relative: 0 %
Eosinophils Absolute: 0.1 10*3/uL (ref 0.0–0.5)
Eosinophils Relative: 1 %
HCT: 39.3 % (ref 39.0–52.0)
Hemoglobin: 12.4 g/dL — ABNORMAL LOW (ref 13.0–17.0)
Immature Granulocytes: 0 %
Lymphocytes Relative: 11 %
Lymphs Abs: 0.8 10*3/uL (ref 0.7–4.0)
MCH: 27.7 pg (ref 26.0–34.0)
MCHC: 31.6 g/dL (ref 30.0–36.0)
MCV: 87.9 fL (ref 80.0–100.0)
Monocytes Absolute: 0.9 10*3/uL (ref 0.1–1.0)
Monocytes Relative: 12 %
Neutro Abs: 5.9 10*3/uL (ref 1.7–7.7)
Neutrophils Relative %: 76 %
Platelets: 210 10*3/uL (ref 150–400)
RBC: 4.47 MIL/uL (ref 4.22–5.81)
RDW: 16.4 % — ABNORMAL HIGH (ref 11.5–15.5)
WBC: 7.8 10*3/uL (ref 4.0–10.5)
nRBC: 0 % (ref 0.0–0.2)

## 2021-06-06 LAB — URINALYSIS, ROUTINE W REFLEX MICROSCOPIC
Bilirubin Urine: NEGATIVE
Bilirubin Urine: NEGATIVE
Glucose, UA: NEGATIVE mg/dL
Glucose, UA: NEGATIVE mg/dL
Hgb urine dipstick: NEGATIVE
Ketones, ur: NEGATIVE mg/dL
Ketones, ur: NEGATIVE mg/dL
Nitrite: NEGATIVE
Nitrite: NEGATIVE
Protein, ur: 30 mg/dL — AB
Protein, ur: >300 mg/dL — AB
Specific Gravity, Urine: 1.017 (ref 1.005–1.030)
Specific Gravity, Urine: 1.024 (ref 1.005–1.030)
pH: 6.5 (ref 5.0–8.0)
pH: 8 (ref 5.0–8.0)

## 2021-06-06 LAB — COMPREHENSIVE METABOLIC PANEL
ALT: 16 U/L (ref 0–44)
AST: 16 U/L (ref 15–41)
Albumin: 4.3 g/dL (ref 3.5–5.0)
Alkaline Phosphatase: 57 U/L (ref 38–126)
Anion gap: 10 (ref 5–15)
BUN: 16 mg/dL (ref 8–23)
CO2: 29 mmol/L (ref 22–32)
Calcium: 9.7 mg/dL (ref 8.9–10.3)
Chloride: 102 mmol/L (ref 98–111)
Creatinine, Ser: 1.43 mg/dL — ABNORMAL HIGH (ref 0.61–1.24)
GFR, Estimated: 51 mL/min — ABNORMAL LOW (ref >60)
Glucose, Bld: 95 mg/dL (ref 70–99)
Potassium: 3.3 mmol/L — ABNORMAL LOW (ref 3.5–5.1)
Sodium: 141 mmol/L (ref 135–145)
Total Bilirubin: 0.6 mg/dL (ref 0.3–1.2)
Total Protein: 7.3 g/dL (ref 6.5–8.1)

## 2021-06-06 LAB — BLOOD GAS, VENOUS
Acid-Base Excess: 4.6 mmol/L — ABNORMAL HIGH (ref 0.0–2.0)
Bicarbonate: 28.8 mmol/L — ABNORMAL HIGH (ref 20.0–28.0)
O2 Saturation: 44.2 %
Patient temperature: 37
pCO2, Ven: 44 mmHg (ref 44.0–60.0)
pH, Ven: 7.43 (ref 7.250–7.430)

## 2021-06-06 LAB — PROTIME-INR
INR: 1 (ref 0.8–1.2)
Prothrombin Time: 12.8 seconds (ref 11.4–15.2)

## 2021-06-06 LAB — RESP PANEL BY RT-PCR (FLU A&B, COVID) ARPGX2
Influenza A by PCR: NEGATIVE
Influenza B by PCR: NEGATIVE
SARS Coronavirus 2 by RT PCR: POSITIVE — AB

## 2021-06-06 IMAGING — DX DG CHEST 2V
2 series · 3 of 3 positions shown · non-contrast
Comparison: Limited correlation made with neck CT [DATE] and
abdominal CT [DATE].

CLINICAL DATA: Fever and possible urinary tract infection.
Suspected sepsis.

EXAM:
CHEST - 2 VIEW

[Series 2: chest lat · 0.14mm/px · 2 of 2 slices shown]
[im 1/2]
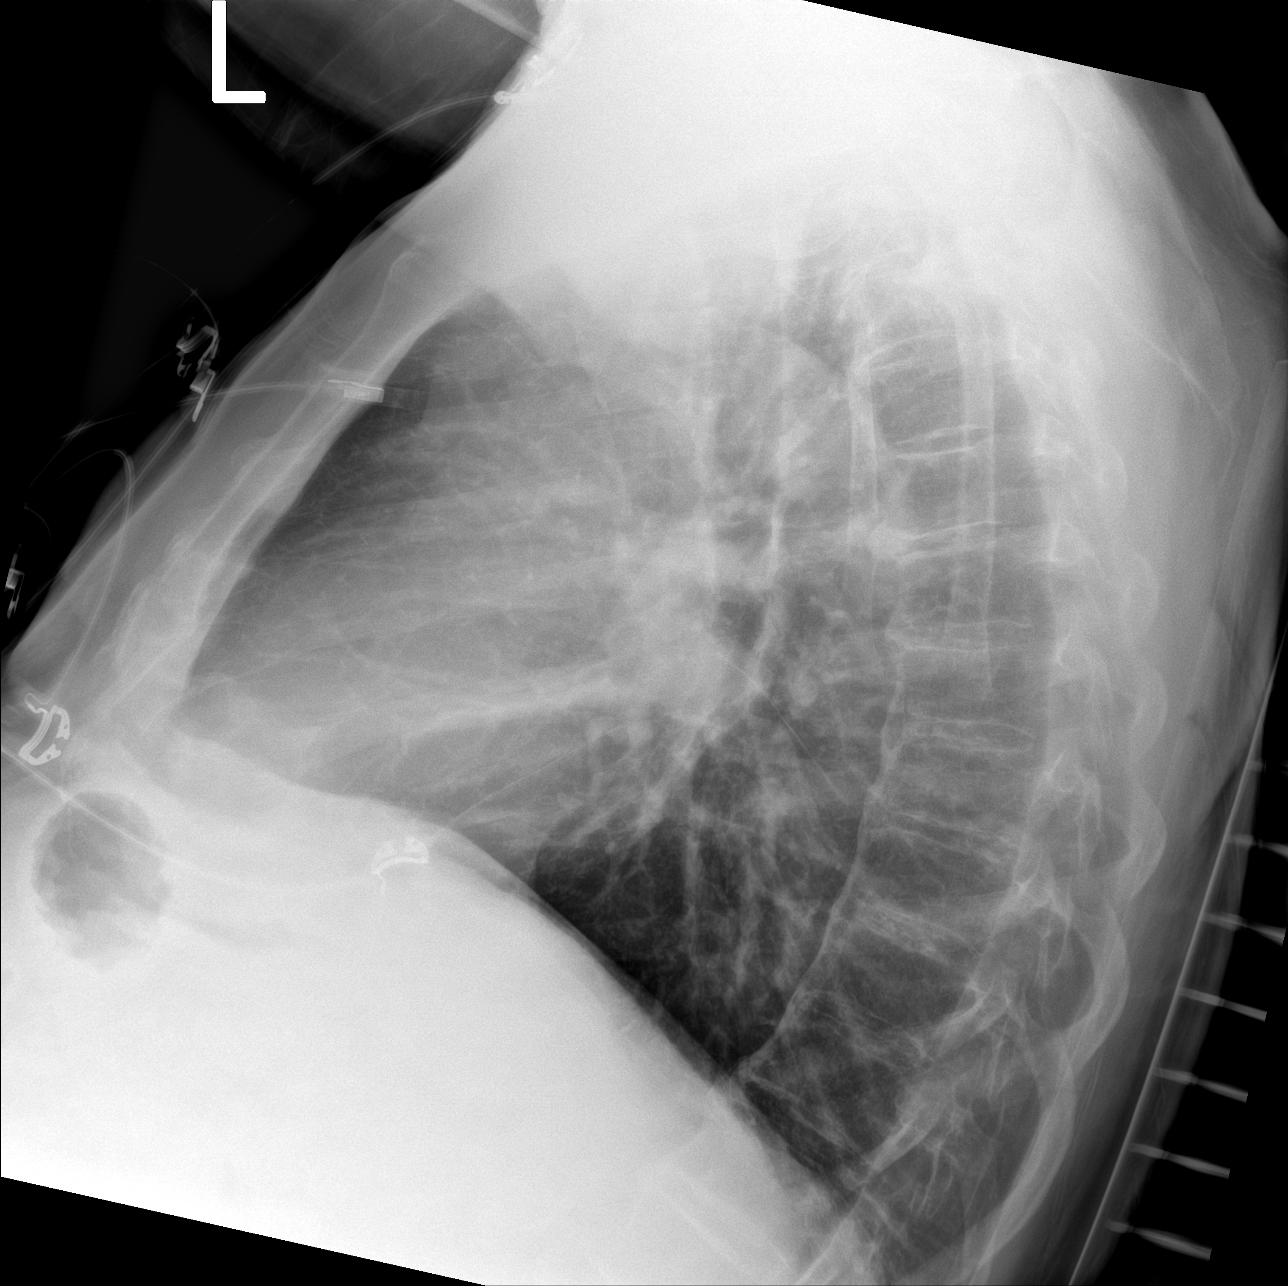
[im 2/2]
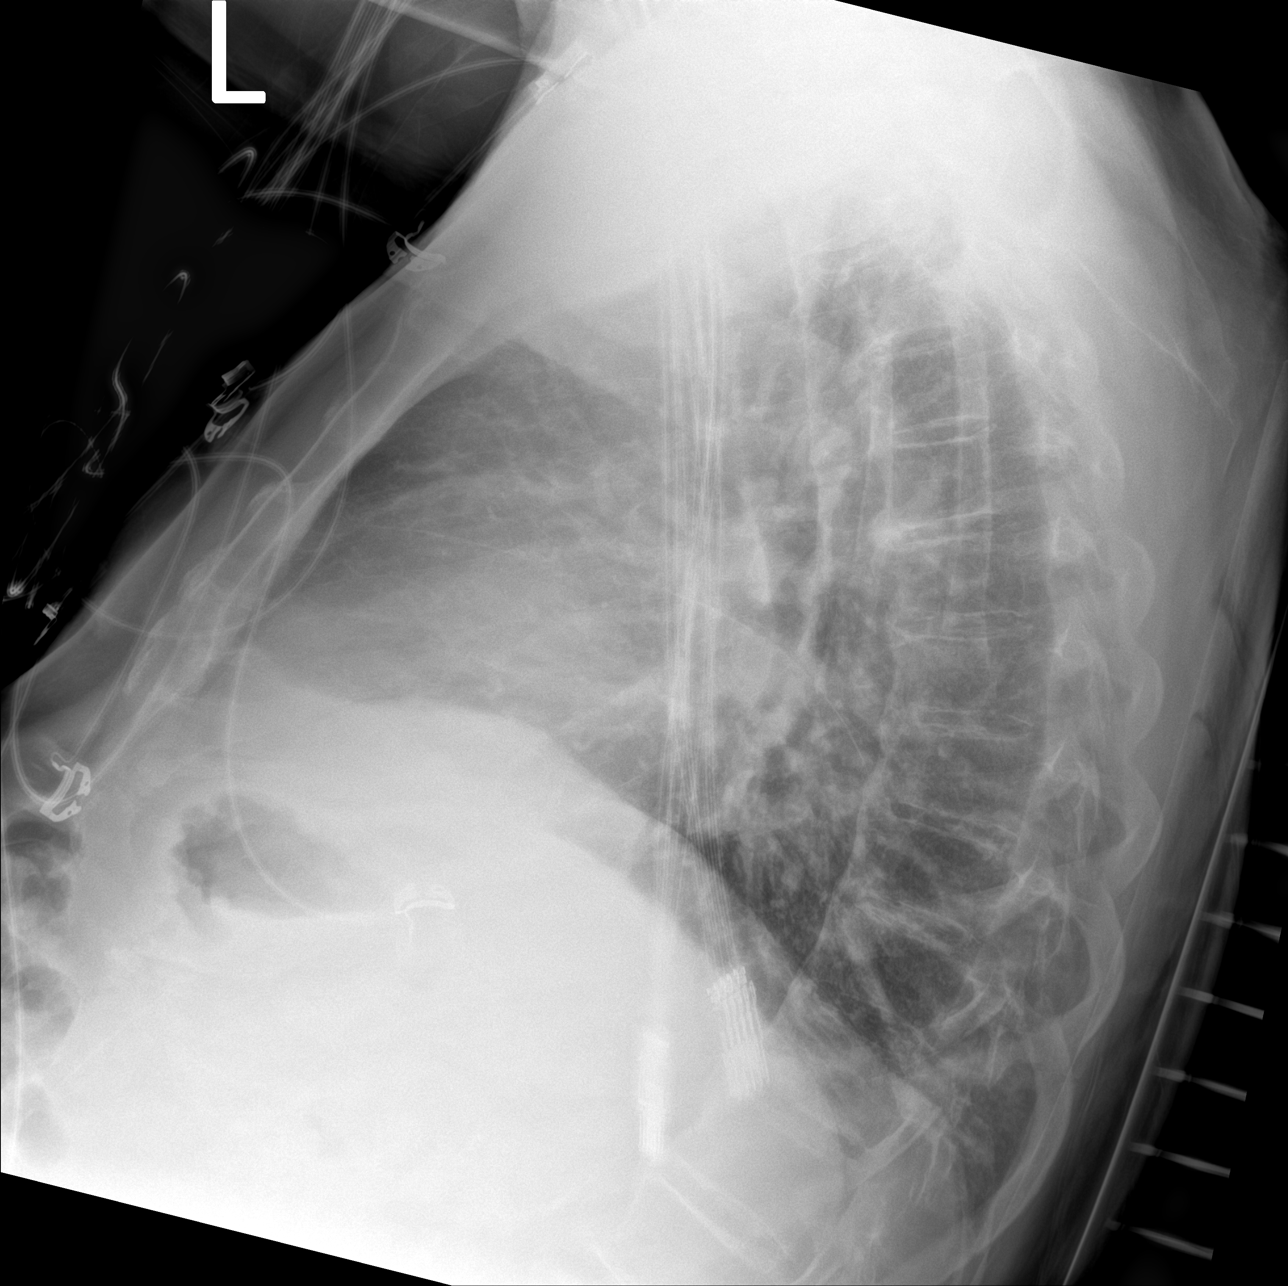

[chest ap]
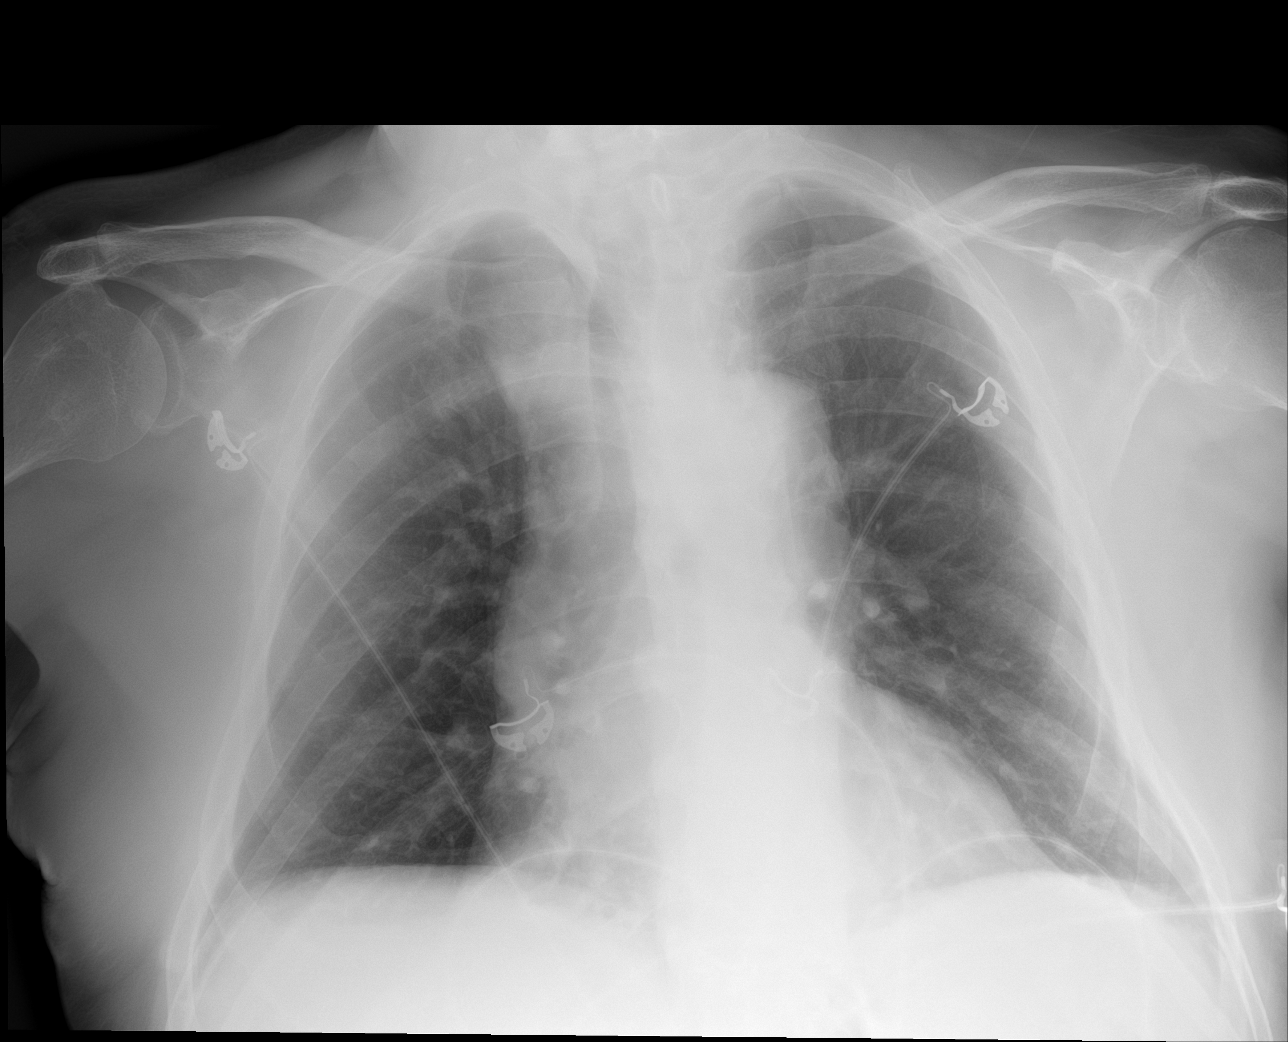

[3 of 3 positions shown; findings below may reference images not displayed]

FINDINGS: The heart size and mediastinal contours are stable with vascular
tortuosity accounting for superior mediastinal widening, similar to
prior CTA. The lungs appear clear. There is no pleural effusion or
pneumothorax. Diffuse syndesmophytes are noted throughout the
thoracic spine. No acute osseous findings are evident. Telemetry
leads overlie the chest.
IMPRESSION: No evidence of active cardiopulmonary process. Probable diffuse
idiopathic skeletal hyperostosis.

## 2021-06-06 IMAGING — CT CT HEAD W/O CM
4 series · 15 of 47 positions shown, 17 images · non-contrast
Comparison: CT head [DATE].  MRI [DATE].

CLINICAL DATA: Head trauma, moderate-severe multiple falls with now
some AMS

EXAM:
CT HEAD WITHOUT CONTRAST
TECHNIQUE: Contiguous axial images were obtained from the base of the skull
through the vertex without intravenous contrast.

[Series 2: head bone · axial · 0.43mm/px · z∈[-161,-147]mm · 2 of 75 slices shown]
[im 8/75  bone]
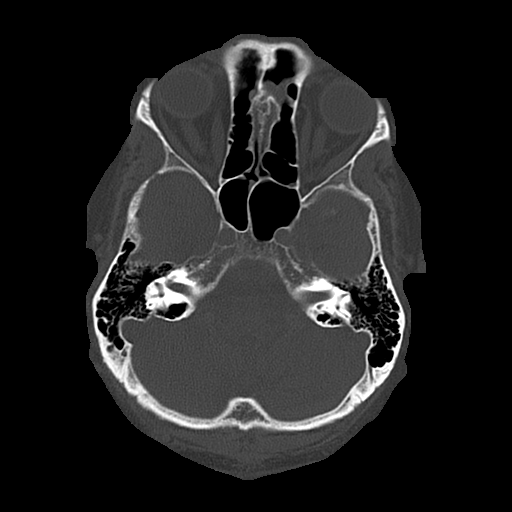
[im 15/75  bone]
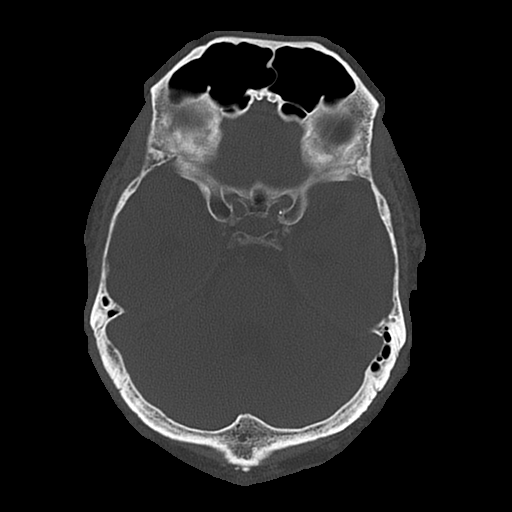

[Series 3: head wo · axial · 0.43mm/px · z∈[-160,-50]mm · 7 of 30 slices shown, 9 images]
[im 4/30  brain]
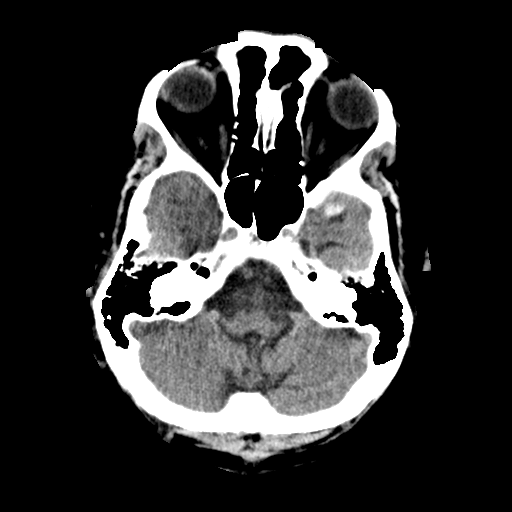
[im 4/30  bone]
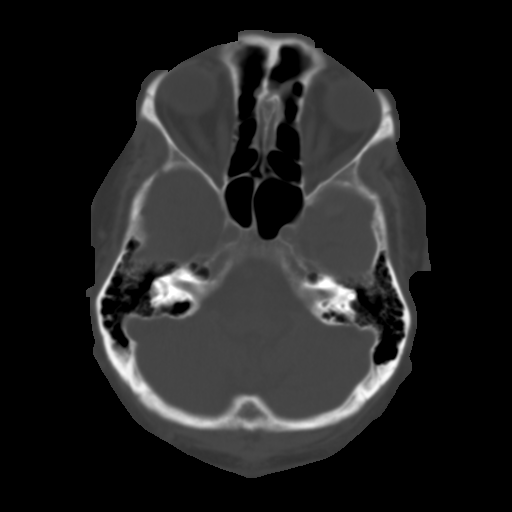
[im 8/30  brain]
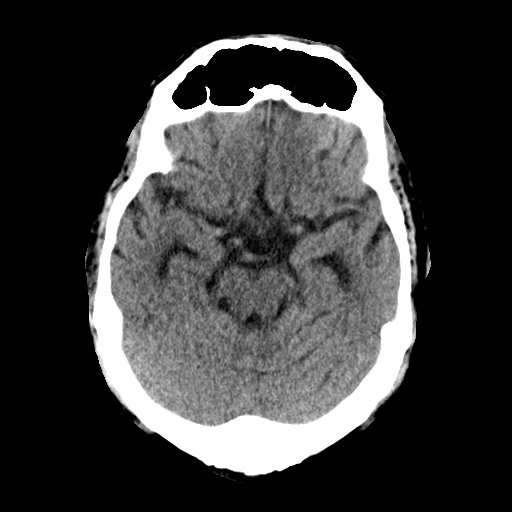
[im 11/30  brain]
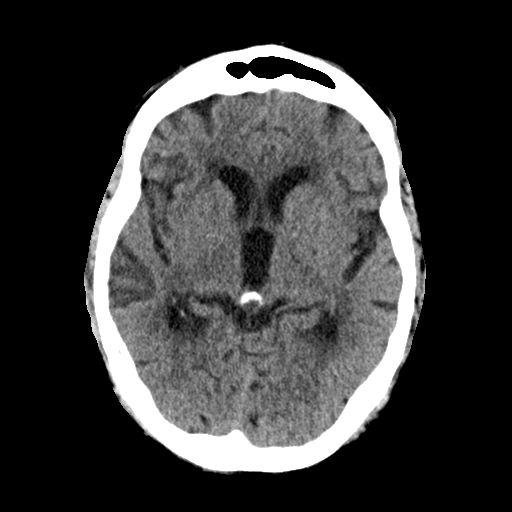
[im 15/30  brain]
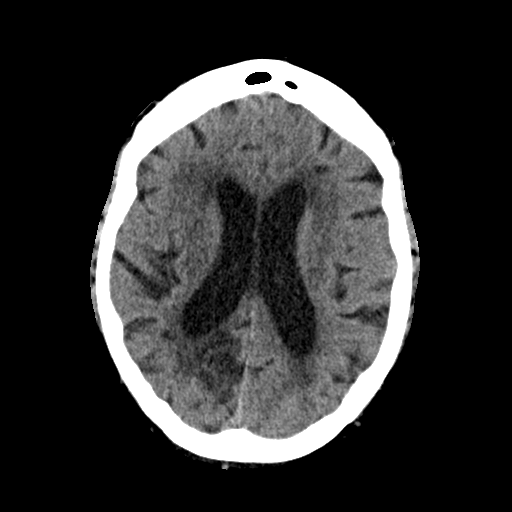
[im 19/30  brain]
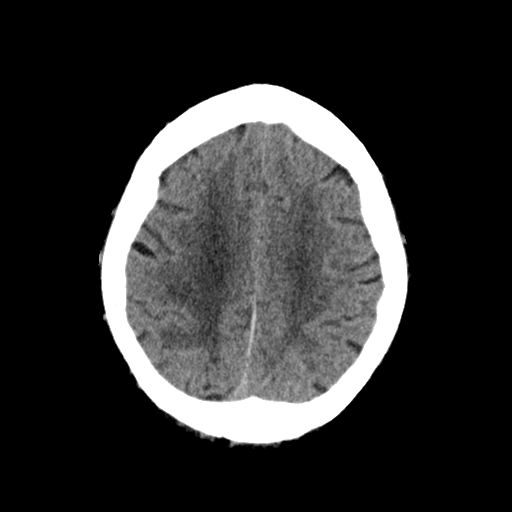
[im 19/30  bone]
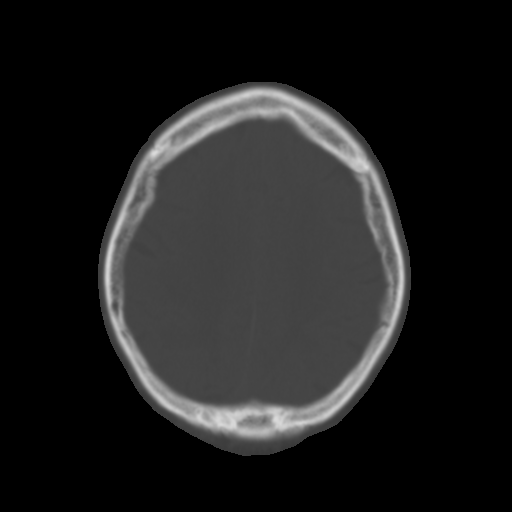
[im 22/30  brain]
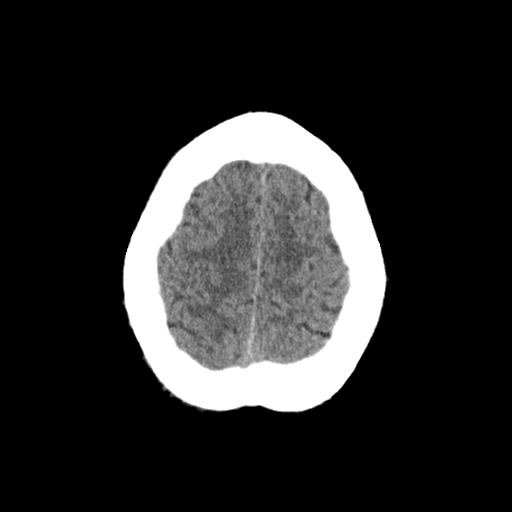
[im 26/30  brain]
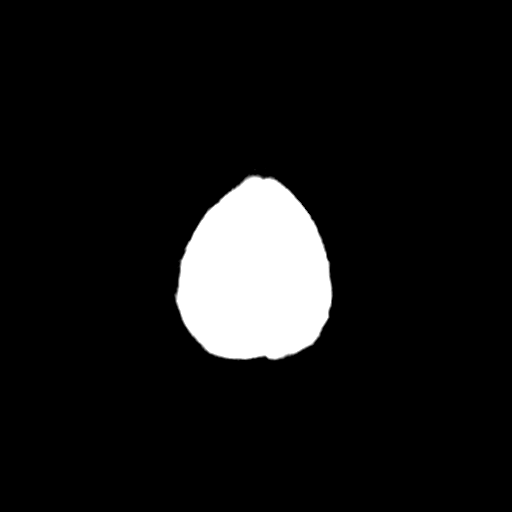

[Series 4: coronal soft · coronal · 0.28mm/px · 3 of 68 slices shown]
[im 23/68  brain]
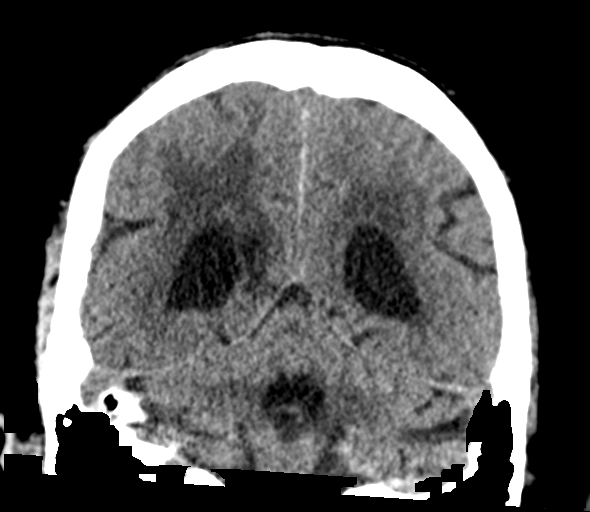
[im 30/68  brain]
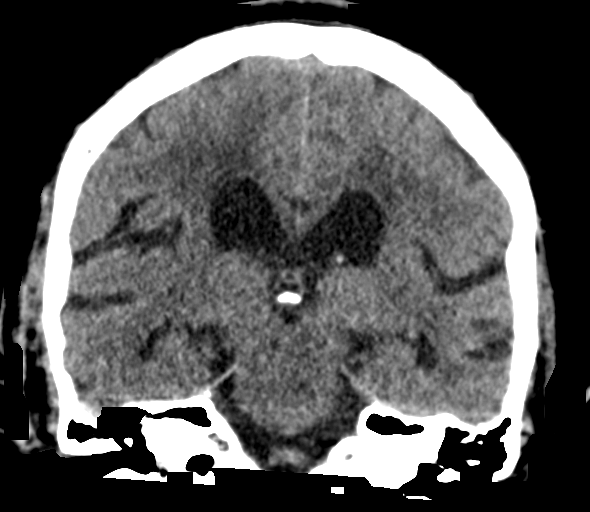
[im 38/68  brain]
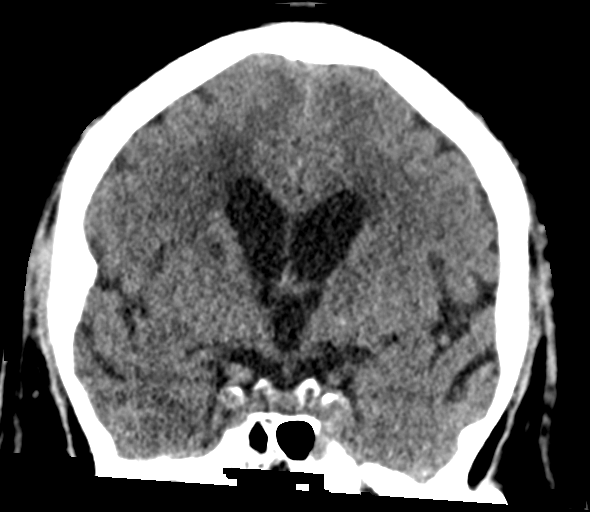

[Series 5: sagittal soft · sagittal · 0.28mm/px · 3 of 56 slices shown]
[im 19/56  brain]
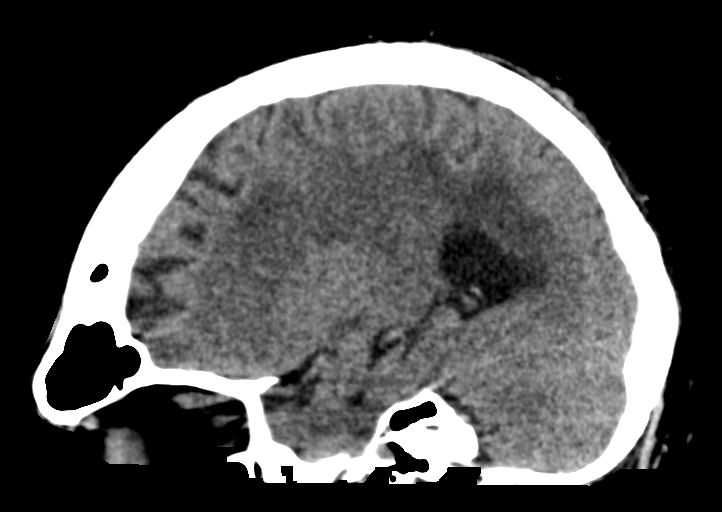
[im 28/56  brain]
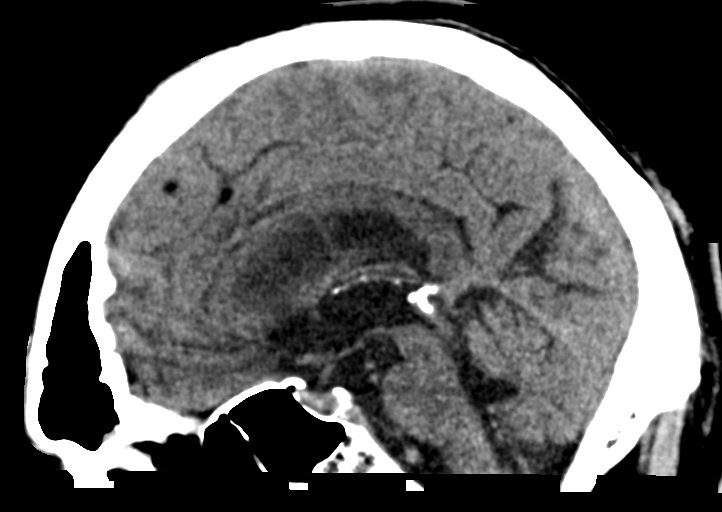
[im 37/56  brain]
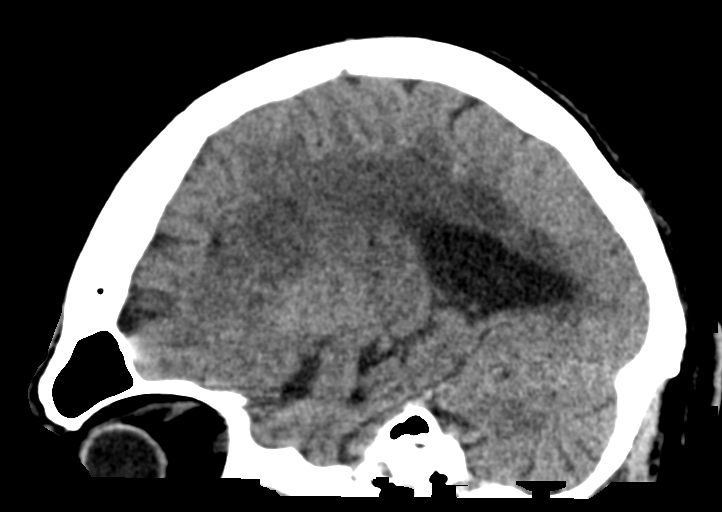

[15 of 47 positions shown; findings below may reference images not displayed]

FINDINGS: Brain: New trace hyperdensity along the posterior falx (for example
series 4, image 84; series 3, image 19), compatible with acute
subdural hemorrhage. No evidence of acute large vascular territory
infarct. Prior infarcts in the right PCA territory, left middle
cerebellar peduncle, and inferior left cerebellum. Additional
moderate to severe patchy white matter hypoattenuation, nonspecific
but compatible with chronic microvascular ischemic disease. No
hydrocephalus, mass lesion, or abnormal mass effect.

Vascular: No hyperdense vessel identified. Calcific intracranial
atherosclerosis.

Skull: No acute fracture.

Sinuses/Orbits: Mild nasal sinus mucosal thickening with evidence of
prior Skopic sinus surgery. Unremarkable orbits. No mastoid
effusions.

Other: No mastoid effusions.
IMPRESSION: 1. Acute trace subdural hemorrhage along the posterior falx.
2. Multiple prior infarcts and moderate to severe chronic
microvascular ischemic disease.

Findings discussed Dr. PELJA via telephone at [DATE].

## 2021-06-06 MED ORDER — SODIUM CHLORIDE 0.9 % IV SOLN
100.0000 mg | Freq: Every day | INTRAVENOUS | Status: DC
  Administered 2021-06-07 – 2021-06-08 (×2): 100 mg via INTRAVENOUS
  Filled 2021-06-06 (×2): qty 20

## 2021-06-06 MED ORDER — SODIUM CHLORIDE 0.9 % IV SOLN
1.0000 g | Freq: Once | INTRAVENOUS | Status: AC
  Administered 2021-06-06: 1 g via INTRAVENOUS
  Filled 2021-06-06: qty 10

## 2021-06-06 MED ORDER — SODIUM CHLORIDE 0.9 % IV SOLN
200.0000 mg | Freq: Once | INTRAVENOUS | Status: AC
  Administered 2021-06-07: 02:00:00 200 mg via INTRAVENOUS
  Filled 2021-06-06: qty 40

## 2021-06-06 MED ORDER — ACETAMINOPHEN 325 MG PO TABS
650.0000 mg | ORAL_TABLET | Freq: Once | ORAL | Status: AC
  Administered 2021-06-06: 650 mg via ORAL

## 2021-06-06 NOTE — Assessment & Plan Note (Addendum)
Will contiue norvasc

## 2021-06-06 NOTE — Consult Note (Signed)
Neurology Consultation  Reason for Consult: Subdural hematoma Referring Physician: Orland Mustard, MD  CC: Fall   History is obtained from: Pt and wife  HPI: Nicholas Mckenzie is a 75 y.o. male with hx recent stroke, CKD, HTN who presented to the ED with AMS.  Pt was recently hospitalized for encephalopathy due to UTI requiring nephrostomy tube. Pt also reports falling about 3 times since his stroke.Pt rolled twice out of bed hitting his head.  He was found to have trace posterior falcine SDH on CT.  Neuro was consulted for further work up.   Past Medical History:  Diagnosis Date   CKD (chronic kidney disease) stage 3, GFR 30-59 ml/min (HCC)    Hypertension    Kidney stones    Sleep apnea    Family History  Problem Relation Age of Onset   Hypertension Mother    Hypertension Other     Social History:   reports that he has never smoked. He has never used smokeless tobacco. He reports current alcohol use. No history on file for drug use.  Medications No current facility-administered medications for this encounter.  ROS:    General ROS: fever Psychological ROS: AMS Ophthalmic ROS: negative for - blurry vision, double vision, eye pain or loss of vision ENT ROS: negative for - epistaxis, nasal discharge, oral lesions, sore throat, tinnitus or vertigo Allergy and Immunology ROS: negative for - hives or itchy/watery eyes Hematological and Lymphatic ROS: negative for - bleeding problems, bruising or swollen lymph nodes Endocrine ROS: negative for - galactorrhea, hair pattern changes, polydipsia/polyuria or temperature intolerance Respiratory ROS: negative for - cough, hemoptysis, shortness of breath or wheezing Cardiovascular ROS: negative for - chest pain, dyspnea on exertion, edema or irregular heartbeat Gastrointestinal ROS: negative for - abdominal pain, diarrhea, hematemesis, nausea/vomiting or stool incontinence Genito-Urinary ROS: dysuria Musculoskeletal ROS: negative for - joint  swelling or muscular weakness Neurological ROS: as noted in HPI Dermatological ROS: negative for rash and skin lesion changes  Exam: Current vital signs: BP (!) 155/77    Pulse 84    Temp (!) 100.4 F (38 C) (Oral)    Resp 18    Ht 5\' 8"  (1.727 m)    Wt 90.7 kg    SpO2 94%    BMI 30.41 kg/m  Vital signs in last 24 hours: Temp:  [98.9 F (37.2 C)-101.1 F (38.4 C)] 100.4 F (38 C) (12/13 2145) Pulse Rate:  [83-94] 84 (12/13 2145) Resp:  [14-18] 18 (12/13 2145) BP: (140-170)/(68-96) 155/77 (12/13 2145) SpO2:  [93 %-95 %] 94 % (12/13 2145) Weight:  [90.7 kg] 90.7 kg (12/13 1448)   Constitutional: Appears well-developed and well-nourished.  Psych: Affect appropriate to situation Eyes: No scleral injection HENT: No OP obstrucion Head: Normocephalic.  Cardiovascular: Normal rate and regular rhythm.  Respiratory: Effort normal, non-labored breathing GI: Soft.  No distension. There is no tenderness.  Skin: WDI  Neuro: AAOx3, fluent speech, no dysarthria, follows commands EOMI, PERRL, no facial asymmetry Tongue and palate midline Nl bulk and tone, no drift Moves all ext antigravity Sensation grossly intact  Labs Reviewed  CBC    Component Value Date/Time   WBC 7.8 06/06/2021 1448   RBC 4.47 06/06/2021 1448   HGB 12.4 (L) 06/06/2021 1448   HCT 39.3 06/06/2021 1448   PLT 210 06/06/2021 1448   MCV 87.9 06/06/2021 1448   MCH 27.7 06/06/2021 1448   MCHC 31.6 06/06/2021 1448   RDW 16.4 (H) 06/06/2021 1448   LYMPHSABS 0.8  06/06/2021 1448   MONOABS 0.9 06/06/2021 1448   EOSABS 0.1 06/06/2021 1448   BASOSABS 0.0 06/06/2021 1448    CMP     Component Value Date/Time   NA 141 06/06/2021 1448   K 3.3 (L) 06/06/2021 1448   CL 102 06/06/2021 1448   CO2 29 06/06/2021 1448   GLUCOSE 95 06/06/2021 1448   BUN 16 06/06/2021 1448   CREATININE 1.43 (H) 06/06/2021 1448   CALCIUM 9.7 06/06/2021 1448   PROT 7.3 06/06/2021 1448   ALBUMIN 4.3 06/06/2021 1448   AST 16 06/06/2021  1448   ALT 16 06/06/2021 1448   ALKPHOS 57 06/06/2021 1448   BILITOT 0.6 06/06/2021 1448   GFRNONAA 51 (L) 06/06/2021 1448    Lipid Panel     Component Value Date/Time   CHOL 82 04/05/2021 0200   TRIG 91 04/05/2021 0200   HDL <10 (L) 04/05/2021 0200   CHOLHDL NOT CALCULATED 04/05/2021 0200   VLDL 18 04/05/2021 0200   LDLCALC NOT CALCULATED 04/05/2021 0200     Imaging I have reviewed the images obtained:  CT-scan of the brain Trace posterior falcine SDH   Assessment: 75 yo with hx stroke p/w AMS found to have trace parafalcine SDH on CT  Recommendations: Cont neuro checks Hold ASA and plavix Repeat head CT in the morning for stability PT/OT Neuro will cont to follow  Total time spent 57min  Ray Church, MD Attending Neurologist

## 2021-06-06 NOTE — Assessment & Plan Note (Signed)
-  chronic avoid nephrotoxic medications such as NSAIDs, Vanco Zosyn combo,  avoid hypotension, continue to follow renal function  

## 2021-06-06 NOTE — Subjective & Objective (Signed)
pT had a fall first when got up from his bed and than again when he was using a walker He hit his head x 2 No LOC  No hx of dementia no syncope No confusion Denies any sick contacts

## 2021-06-06 NOTE — Assessment & Plan Note (Addendum)
IR consult, NPO post midnight

## 2021-06-06 NOTE — ED Triage Notes (Signed)
Home nurse requested to come to ED for fever and possible UTI. Hx UTI , Hx kidney failure .  3 falls in 4 hours , confused .  1 baby aspirin this Am.

## 2021-06-06 NOTE — Assessment & Plan Note (Addendum)
Cycle threshold 18.9 COVID infection -incidental finding patient is  vaccinated   boosted   recent fevers  CXR with no infiltrates  treatment is recommended if Cycle Threshold < 32    Not a candidate for Paxlovid due to hx of plavix use discussed with pharmacy Obtain inflammatory markers Pronate as needed   Supportive measures Avoid over aggressive fluid resuscitation Airborne precautions

## 2021-06-06 NOTE — ED Provider Notes (Signed)
[facility] EMERGENCY DEPT Provider Note   CSN: [id number] Arrival date & time: 06/06/21  1438     History Chief Complaint  Patient presents with   Fever    Nicholas Mckenzie is a 75 y.o. male.  The history is provided by the patient, medical records and the spouse. No language interpreter was used.  Fever Temp source:  Oral Severity:  Moderate Timing:  Constant Progression:  Unable to specify Chronicity:  New Ineffective treatments:  None tried Associated symptoms: chills, confusion and congestion   Associated symptoms: no chest pain, no cough, no diarrhea, no dysuria, no headaches, no nausea, no rash, no somnolence and no vomiting   Associated symptoms comment:  Urine foul smell Risk factors: recent sickness       Past Medical History:  Diagnosis Date   CKD (chronic kidney disease) stage 3, GFR 30-59 ml/min (HCC)    Hypertension    Kidney stones    Sleep apnea     Patient Active Problem List   Diagnosis Date Noted   Goals of care, counseling/discussion 04/03/2021   Acute encephalopathy 04/02/2021   Ureteral stent occlusion, sequela 04/02/2021   Elevated transaminase level 04/02/2021   CKD (chronic kidney disease) stage 3a 03/11/2021   Major depressive disorder, recurrent episode, in partial remission ([place]) 03/11/2021   Hypertension    Sleep apnea     Past Surgical History:  Procedure Laterality Date   CATARACT EXTRACTION     IR NEPHROSTOMY PLACEMENT RIGHT  04/07/2021   IR PATIENT EVAL TECH 0-60 MINS  06/02/2021   kidney stent     MANDIBLE FRACTURE SURGERY         Family History  Problem Relation Age of Onset   Hypertension Mother    Hypertension Other     Social History   Tobacco Use   Smoking status: Never   Smokeless tobacco: Never  Substance Use Topics   Alcohol use: Yes    Comment: Socially; twice a week    Home Medications Prior to Admission medications   Medication Sig Start Date End Date Taking? Authorizing Provider   amLODipine (NORVASC) 5 MG tablet Take 1 tablet (5 mg total) by mouth daily. 04/14/21   Antonieta Pert, MD  aspirin 81 MG chewable tablet Chew 1 tablet (81 mg total) by mouth daily. 04/14/21   Antonieta Pert, MD  clopidogrel (PLAVIX) 75 MG tablet Take 1 tablet (75 mg total) by mouth daily. 04/05/21 07/04/21  Antonieta Pert, MD  FLUoxetine (PROZAC) 40 MG capsule Take 40 mg by mouth daily. Patient not taking: Reported on 04/09/2021    [provider]  lactobacillus (FLORANEX/LACTINEX) PACK Take 1 packet (1 g total) by mouth 2 (two) times daily. 04/13/21   Antonieta Pert, MD  melatonin 3 MG TABS tablet Take 3 mg by mouth at bedtime. Patient not taking: Reported on 04/09/2021    [provider]  Multiple Vitamins-Minerals (MULTIVITAMIN WITH MINERALS) tablet Take 1 tablet by mouth daily. Patient not taking: Reported on 04/09/2021    [provider]    Allergies    Statins  Review of Systems   Review of Systems  Constitutional:  Positive for chills, fatigue and fever. Negative for diaphoresis.  HENT:  Positive for congestion.   Respiratory:  Negative for cough, chest tightness, shortness of breath and wheezing.   Cardiovascular:  Negative for chest pain.  Gastrointestinal:  Negative for abdominal pain, constipation, diarrhea, nausea and vomiting.  Genitourinary:  Positive for decreased urine volume. Negative for dysuria  and flank pain.  Musculoskeletal:  Negative for back pain, neck pain and neck stiffness.  Skin:  Negative for rash and wound.  Neurological:  Negative for light-headedness and headaches.  Psychiatric/Behavioral:  Positive for confusion. Negative for agitation.   All other systems reviewed and are negative.  Physical Exam Updated Vital Signs BP (!) 142/80    Pulse 94    Temp (!) 101.1 F (38.4 C)    Resp 15    Ht 5\' 8"  (1.727 m)    Wt 90.7 kg    SpO2 93%    BMI 30.41 kg/m   Physical Exam Vitals and nursing note reviewed.  Constitutional:      General: He is  not in acute distress.    Appearance: He is well-developed. He is not ill-appearing, toxic-appearing or diaphoretic.  HENT:     Head: Normocephalic and atraumatic.     Nose: No congestion or rhinorrhea.     Mouth/Throat:     Mouth: Mucous membranes are moist.     Pharynx: No oropharyngeal exudate or posterior oropharyngeal erythema.  Eyes:     Extraocular Movements: Extraocular movements intact.     Conjunctiva/sclera: Conjunctivae normal.     Pupils: Pupils are equal, round, and reactive to light.  Cardiovascular:     Rate and Rhythm: Normal rate and regular rhythm.     Heart sounds: No murmur heard. Pulmonary:     Effort: Pulmonary effort is normal. No respiratory distress.     Breath sounds: Normal breath sounds. No wheezing, rhonchi or rales.  Chest:     Chest wall: No tenderness.  Abdominal:     General: Abdomen is flat.     Palpations: Abdomen is soft.     Tenderness: There is no abdominal tenderness. There is no right CVA tenderness, left CVA tenderness, guarding or rebound.  Musculoskeletal:        General: No swelling or tenderness.     Cervical back: Neck supple. No tenderness.       Back:     Right lower leg: No edema.     Left lower leg: No edema.  Skin:    General: Skin is warm and dry.     Capillary Refill: Capillary refill takes less than 2 seconds.     Findings: No erythema.  Neurological:     General: No focal deficit present.     Mental Status: He is alert.  Psychiatric:        Mood and Affect: Mood normal.    ED Results / Procedures / Treatments   Labs (all labs ordered are listed, but only abnormal results are displayed) Labs Reviewed  RESP PANEL BY RT-PCR (FLU A&B, COVID) ARPGX2 - Abnormal; Notable for the following components:      Result Value   SARS Coronavirus 2 by RT PCR POSITIVE (*)    All other components within normal limits  URINALYSIS, ROUTINE W REFLEX MICROSCOPIC - Abnormal; Notable for the following components:   APPearance CLOUDY  (*)    Hgb urine dipstick LARGE (*)    Protein, ur >300 (*)    Leukocytes,Ua MODERATE (*)    Bacteria, UA FEW (*)    All other components within normal limits  COMPREHENSIVE METABOLIC PANEL - Abnormal; Notable for the following components:   Potassium 3.3 (*)    Creatinine, Ser 1.43 (*)    GFR, Estimated 51 (*)    All other components within normal limits  CBC WITH DIFFERENTIAL/PLATELET - Abnormal; Notable for  the following components:   Hemoglobin 12.4 (*)    RDW 16.4 (*)    All other components within normal limits  URINALYSIS, ROUTINE W REFLEX MICROSCOPIC - Abnormal; Notable for the following components:   Protein, ur 30 (*)    Leukocytes,Ua MODERATE (*)    Bacteria, UA RARE (*)    All other components within normal limits  URINE CULTURE  CULTURE, BLOOD (ROUTINE X 2)  CULTURE, BLOOD (ROUTINE X 2)  URINE CULTURE  LACTIC ACID, PLASMA  PROTIME-INR    EKG EKG Interpretation  Date/Time:  Tuesday June 06 2021 14:50:03 EST Ventricular Rate:  97 PR Interval:  180 QRS Duration: 84 QT Interval:  343 QTC Calculation: 436 R Axis:   34 Text Interpretation: Sinus rhythm Probable left atrial enlargement When compared to prior ECG, faster rate. No STEMI Confirmed by Antony Blackbird [phone]) on 06/06/2021 3:11:10 PM  Radiology DG Chest 2 View  Result Date: 06/06/2021 CLINICAL DATA:  Fever and possible urinary tract infection. Suspected sepsis. EXAM: CHEST - 2 VIEW COMPARISON:  Limited correlation made with neck CT 02/02/2021 and abdominal CT 04/01/2021. FINDINGS: The heart size and mediastinal contours are stable with vascular tortuosity accounting for superior mediastinal widening, similar to prior CTA. The lungs appear clear. There is no pleural effusion or pneumothorax. Diffuse syndesmophytes are noted throughout the thoracic spine. No acute osseous findings are evident. Telemetry leads overlie the chest. IMPRESSION: No evidence of active cardiopulmonary process. 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 Electronically Signed   By: Richardean Sale M.D.   On: 06/06/2021 15:31   CT Head Wo Contrast  Result Date: 06/06/2021 CLINICAL DATA:  Head trauma, moderate-severe multiple falls with now some AMS EXAM: CT HEAD WITHOUT CONTRAST TECHNIQUE: Contiguous axial images were obtained from the base of the skull through the vertex without intravenous contrast. COMPARISON:  CT head April 02, 2021.  MRI April 05, 2021. FINDINGS: Brain: New trace hyperdensity along the posterior falx (for example series 4, image 84; series 3, image 19), compatible with acute subdural hemorrhage. No evidence of acute large vascular territory infarct. Prior infarcts in the right PCA territory, left middle cerebellar peduncle, and inferior left cerebellum. Additional moderate to severe patchy white matter hypoattenuation, nonspecific but compatible with chronic microvascular ischemic disease. No hydrocephalus, mass lesion, or abnormal mass effect. Vascular: No hyperdense vessel identified. Calcific intracranial atherosclerosis. Skull: No acute fracture. Sinuses/Orbits: Mild nasal sinus mucosal thickening with evidence of prior Skopic sinus surgery. Unremarkable orbits. No mastoid effusions. Other: No mastoid effusions. IMPRESSION: 1. Acute trace subdural hemorrhage along the posterior falx. 2. 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 Findings discussed Dr. Sherry Ruffing via telephone at 4:20 PM. Electronically Signed   By: Margaretha Sheffield M.D.   On: 06/06/2021 16:26    Procedures Procedures   CRITICAL CARE Performed by: Gwenyth Allegra Sonnia Strong Total critical care time: 35 minutes Critical care time was exclusive of separately billable procedures and treating other patients. Critical care was necessary to treat or prevent imminent or life-threatening deterioration. Critical care was time spent personally by me on the following activities: development of  treatment plan with patient and/or surrogate as well as nursing, discussions with consultants, evaluation of patient's response to treatment, examination of patient, obtaining history from patient or surrogate, ordering and performing treatments and interventions, ordering and review of laboratory studies, ordering and review of radiographic studies, pulse oximetry and re-evaluation of patient's condition.  Medications Ordered in ED Medications  acetaminophen (TYLENOL) tablet 650  mg (650 mg Oral Given 06/06/21 1456)  cefTRIAXone (ROCEPHIN) 1 g in sodium chloride 0.9 % 100 mL IVPB (0 g Intravenous Stopped 06/06/21 1913)    ED Course  I have reviewed the triage vital signs and the nursing notes.  Pertinent labs & imaging results that were available during my care of the patient were reviewed by me and considered in my medical decision making (see chart for details).    MDM Rules/Calculators/A&P                           Nicholas Mckenzie is a 75 y.o. male with a past medical history significant for recent stroke, hypertension, CKD, and recent acute encephalopathy related to urinary tract infection requiring nephrostomy tube who presents with altered mental status, fever, and foul-smelling urine.  According to patient and family, patient is having some problems with his nephrostomy tube with some leakage and then over the last 48 hours has noticed a foul smell in the urine again.  She also says her last day or 2 he has had more confusion and has had 3 falls.  This is how he acted before he went more severely encephalopathic in the setting of his recent infection and stroke.  She says that he rolled twice out of bed hitting his head and then had another fall.  Currently the patient is denying any pain in his head or neck or back.  He denies any flank pain or abdominal pain.  He agrees that his urine is slightly darker and more foul-smelling and the patient reports he has felt slightly confused.  On arrival,  patient was found to be febrile but otherwise is not tachycardic, not tachypneic, not hypoxic, not hypotensive.  Initially do not feel he meets sepsis criteria but will start work-up as we are concerned about UTI leading to mild return of encephalopathy and likely to the falls.  Of note, family reports that due to the problems with the nephrostomy he was scheduled to have it exchanged tomorrow.  On exam, patient has a nephrostomy tube in his right lower back that is not sutured in place but did not have leakage on my exam.  No tenderness or erythema seen.  Lungs clear and chest nontender.  Abdomen nontender.  Patient moving all extremities.  Patient in no distress initially.  Will start work-up to look for source of infection and will try to get urinalysis from both his voided urine and from the tube.  Will get screening lab, and will also get chest x-ray and COVID test for the fever.  We will get a CT head due to the fall.  Anticipate reassessment after work-up is completed to determine disposition.   4:32 PM Work-up has continued to return.  Patient has a positive COVID test, urinalysis shows leukocytes and bacteria with a foul smell and recent UTI is concerning for infection, and his CT of the head shows acute but small traumatic subdural hematoma.  Will call neurosurgery for intracranial bleed.  Will call IR for the concern for nephrostomy tube problem, and anticipate admission to medicine for further management.  4:58 PM Spoke to interventional radiology who feels that a consult after admission is reasonable so that they can discuss interventional radiology replacing the nephrostomy tube especially in the setting of this infection.  He agrees that either hospital is appropriate but given the possible neurosurgical consultation agrees that [facility] is most likely the best place.  Will call  medicine for admission, will order antibiotics, and will await neurosurgery recommendations as well.  5:35  PM Just spoke to neurosurgery.  He reviewed the images and due to the very small and essentially asymptomatic subdural, he would not recommend any repeat imaging unless the patient start develop any headache or new symptoms.  At that point he would recommend repeat imaging.  He recommends conservative management and treating the other causes of encephalopathy and problems the patient has including the COVID and the UTI and needing nephrostomy tube replacement.  Medicine will admit.   Final Clinical Impression(s) / ED Diagnoses Final diagnoses:  Subdural hematoma  Traumatic injury of head, initial encounter  COVID-19  Acute cystitis without hematuria  Fever, unspecified fever cause  Altered mental status, unspecified altered mental status type     Clinical Impression: 1. Subdural hematoma   2. Traumatic injury of head, initial encounter   3. COVID-19   4. Acute cystitis without hematuria   5. Fever, unspecified fever cause   6. Altered mental status, unspecified altered mental status type     Disposition: Admit  This note was prepared with assistance of Dragon voice recognition software. Occasional wrong-word or sound-a-like substitutions may have occurred due to the inherent limitations of voice recognition software.     Azuri Bozard, Gwenyth Allegra, MD 06/06/21 2151

## 2021-06-06 NOTE — H&P (Signed)
Nicholas Mckenzie [id number] DOB: 09-Oct-1945 DOA: 06/06/2021     PCP: Chesley Noon, MD   Outpatient Specialists:     NEurology  not sure of the name    Urology Dr.Manny  Patient arrived to ER on 06/06/21 at 1438 Referred by Attending Orma Flaming, MD   Patient coming from: home Lives  With family    Chief Complaint:   Chief Complaint  Patient presents with   Fever    HPI: Nicholas Mckenzie is a 75 y.o. male with medical history significant of  CKD stage 3a, HTN, OSA, depression/anxiety, hx of CVA on Plavix, hx of kidney stones    Presented with   a fall pT had a fall first when got up from his bed and than again when he was using a walker He hit his head x 2 No LOC  No hx of dementia no syncope No confusion Denies any sick contacts Admitted a few months ago with ureteral obstruction and has nephrostomy tube on the right ever since. Has has foul smelling drainage from tube.  Of note, family reports that due to the problems with the nephrostomy he was scheduled to have it exchanged tomorrow. Urology plans to do nephrectomy in January Pt has frequent falls off the bed that is a custom bed built very high with oversized mattress that hangs over resulting in pt rolling off the bed    Has  been vaccinated against COVID  and boosted had     Initial COVID TEST   POSITIVE,    Lab Results  Component Value Date   SARSCOV2NAA POSITIVE (A) 06/06/2021   [place] NEGATIVE 04/12/2021   [place] NEGATIVE 04/01/2021   [place] NEGATIVE 03/11/2021     While in ER: Febrile to 101. potassium 3.3, COVID POSITIVE CXR neg CT head: acute trace subdural hemorrhage along the posterior falx. (On plavix)  UA worrisome for UTI and given a dose of rocephin  Urine and blood cultures obtained IR notified will need t consult on arrival    Vitals  labs and radiology finding personally reviewed    ED Triage Vitals  Enc Vitals Group     BP 06/06/21 1448 (!) 170/96     Pulse Rate  06/06/21 1448 93     Resp 06/06/21 1448 18     Temp 06/06/21 1448 (!) 101.1 F (38.4 C)     Temp Source 06/06/21 2145 Oral     SpO2 06/06/21 1448 94 %     Weight 06/06/21 1448 200 lb (90.7 kg)     Height 06/06/21 1448 5\' 8"  (1.727 m)     Head Circumference --      Peak Flow --      Pain Score 06/06/21 1448 0     Pain Loc --      Pain Edu? --      Excl. in [place]? --   TMAX(24)@     _________________________________________ Significant initial  Findings: Abnormal Labs Reviewed  RESP PANEL BY RT-PCR (FLU A&B, COVID) ARPGX2 - Abnormal; Notable for the following components:      Result Value   SARS Coronavirus 2 by RT PCR POSITIVE (*)    All other components within normal limits  URINALYSIS, ROUTINE W REFLEX MICROSCOPIC - Abnormal; Notable for the following components:   APPearance CLOUDY (*)    Hgb urine dipstick LARGE (*)    Protein, ur >300 (*)    Leukocytes,Ua MODERATE (*)    Bacteria, UA FEW (*)  All other components within normal limits  COMPREHENSIVE METABOLIC PANEL - Abnormal; Notable for the following components:   Potassium 3.3 (*)    Creatinine, Ser 1.43 (*)    GFR, Estimated 51 (*)    All other components within normal limits  CBC WITH DIFFERENTIAL/PLATELET - Abnormal; Notable for the following components:   Hemoglobin 12.4 (*)    RDW 16.4 (*)    All other components within normal limits  URINALYSIS, ROUTINE W REFLEX MICROSCOPIC - Abnormal; Notable for the following components:   Protein, ur 30 (*)    Leukocytes,Ua MODERATE (*)    Bacteria, UA RARE (*)    All other components within normal limits   ____________________________________________ Ordered   CT head Acute trace subdural hemorrhage along the posterior falx. CXR -  NON acute   _________________________ Troponin 12 ECG: Ordered Personally reviewed by me showing: HR : 97 Rhythm:  NSR,    no evidence of ischemic changes QTC 436    The recent clinical data is shown below. Vitals:   06/06/21  1913 06/06/21 2023 06/06/21 2115 06/06/21 2145  BP: (!) 156/85 (!) 159/68 (!) 146/81 (!) 155/77  Pulse: 85 83 86 84  Resp: 18 18 18 18   Temp: 98.9 F (37.2 C)   (!) 100.4 F (38 C)  TempSrc:    Oral  SpO2: 95% 93% 94% 94%  Weight:      Height:        WBC     Component Value Date/Time   WBC 7.8 06/06/2021 1448   LYMPHSABS 0.8 06/06/2021 1448   MONOABS 0.9 06/06/2021 1448   EOSABS 0.1 06/06/2021 1448   BASOSABS 0.0 06/06/2021 1448      Lactic Acid, Venous    Component Value Date/Time   LATICACIDVEN 1.1 06/06/2021 1458    Procalcitonin <0.1     UA  evidence of UTI    Urine analysis:    Component Value Date/Time   COLORURINE YELLOW 06/06/2021 [place] 06/06/2021 1545   LABSPEC 1.017 06/06/2021 1545   PHURINE 6.5 06/06/2021 1545   GLUCOSEU NEGATIVE 06/06/2021 [place] 06/06/2021 1545   [place] 06/06/2021 1545   [place] 06/06/2021 1545   PROTEINUR 30 (A) 06/06/2021 1545   NITRITE NEGATIVE 06/06/2021 1545   LEUKOCYTESUR MODERATE (A) 06/06/2021 1545    Results for orders placed or performed during the hospital encounter of 06/06/21  Resp Panel by RT-PCR (Flu A&B, Covid) Nasopharyngeal Swab     Status: Abnormal   Collection Time: 06/06/21  2:58 PM   Specimen: Nasopharyngeal Swab; Nasopharyngeal(NP) swabs in vial transport medium  Result Value Ref Range Status   SARS Coronavirus 2 by RT PCR POSITIVE (A) NEGATIVE Final         Influenza A by PCR NEGATIVE NEGATIVE Final   Influenza B by PCR NEGATIVE NEGATIVE Final          _______________________________________________________ ER Provider Called:   Neurosurgery    They Recommend admit to medicine  reviewed the images and due to the very small and essentially asymptomatic subdural, he would not recommend any repeat imaging unless the patient start develop any headache or new symptoms.  At that point he would recommend repeat imaging.  He recommends conservative  management and treating the other causes of encephalopathy and problems the patient has including the COVID and the UTI and needing nephrostomy tube replacement.   _______________________________________________ Hospitalist was called for admission for acute encephalopathy and fall in  the setting of COVID infection, UTI fall resulting in small subdural  The following Work up has been ordered so far:  Orders Placed This Encounter  Procedures   Urine Culture   Resp Panel by RT-PCR (Flu A&B, Covid) Nasopharyngeal Swab   Culture, blood (Routine x 2)   Urine Culture   DG Chest 2 View   CT Head Wo Contrast   Urinalysis, Routine w reflex microscopic   Comprehensive metabolic panel   CBC with Differential   Protime-INR   Urinalysis, Routine w reflex microscopic Drain   Diet NPO time specified   Notify physician (specify)   Neuro checks   Cardiac monitoring   Consult to neurosurgery   Consult to interventional radiologist (physician to physician consult) [phone]   Consult to hospitalist   EKG 12-Lead   Admit to Inpatient (patient's expected length of stay will be greater than 2 midnights or inpatient only procedure)     Following Medications were ordered in ER: Medications  acetaminophen (TYLENOL) tablet 650 mg (650 mg Oral Given 06/06/21 1456)  cefTRIAXone (ROCEPHIN) 1 g in sodium chloride 0.9 % 100 mL IVPB (0 g Intravenous Stopped 06/06/21 1913)        Consult Orders  (From admission, onward)           Start     Ordered   06/06/21 1700  Consult to hospitalist  CL called for consult 17:09  Once       Provider:  (Not yet assigned)  Question Answer Comment  Place call to: [facility]   Reason for Consult Admit      06/06/21 [place] initial  Findings:  labs showing:    Recent Labs  Lab 06/06/21 1448  NA 141  K 3.3*  CO2 29  GLUCOSE 95  BUN 16  CREATININE 1.43*  CALCIUM 9.7    Cr   stable,    Lab  Results  Component Value Date   CREATININE 1.43 (H) 06/06/2021   CREATININE 1.30 (H) 04/14/2021   CREATININE 1.36 (H) 04/12/2021    Recent Labs  Lab 06/06/21 1448  AST 16  ALT 16  ALKPHOS 57  BILITOT 0.6  PROT 7.3  ALBUMIN 4.3   Lab Results  Component Value Date   CALCIUM 9.7 06/06/2021   PHOS 4.4 04/09/2021    Plt: Lab Results  Component Value Date   PLT 210 06/06/2021      COVID-19 Labs  No results for input(s): DDIMER, FERRITIN, LDH, CRP in the last 72 hours.  Lab Results  Component Value Date   SARSCOV2NAA POSITIVE (A) 06/06/2021   SARSCOV2NAA NEGATIVE 04/12/2021   [place] NEGATIVE 04/01/2021   [place] NEGATIVE 03/11/2021    Venous  Blood Gas result:  pH 7.430 pCO2 44   Recent Labs  Lab 06/06/21 1448  WBC 7.8  NEUTROABS 5.9  HGB 12.4*  HCT 39.3  MCV 87.9  PLT 210    HG/HCT   stable,       Component Value Date/Time   HGB 12.4 (L) 06/06/2021 1448   HCT 39.3 06/06/2021 1448   MCV 87.9 06/06/2021 1448     Cardiac Panel (last 3 results) Recent Labs    06/06/21 2328  CKTOTAL 68    DM  labs:  HbA1C: Recent Labs    04/05/21 0200  HGBA1C 6.1*      CBG (last 3)  No results for input(s):  GLUCAP in the last 72 hours.    Cultures:    Component Value Date/Time   SDES ABSCESS 04/07/2021 1256   SPECREQUEST POST RIGHT SIDED PCN PLACEMENT 04/07/2021 1256   CULT  04/07/2021 1256    RARE ENTEROBACTER CLOACAE NO ANAEROBES ISOLATED Performed at [facility], [place] [address], [place], [place] [address]    REPTSTATUS 04/12/2021 FINAL 04/07/2021 1256     Radiological Exams on Admission: DG Chest 2 View  Result Date: 06/06/2021 CLINICAL DATA:  Fever and possible urinary tract infection. Suspected sepsis. EXAM: CHEST - 2 VIEW COMPARISON:  Limited correlation made with neck CT 02/02/2021 and abdominal CT 04/01/2021. FINDINGS: The heart size and mediastinal contours are stable with vascular tortuosity accounting for superior  mediastinal widening, similar to prior CTA. The lungs appear clear. There is no pleural effusion or pneumothorax. Diffuse syndesmophytes are noted throughout the thoracic spine. No acute osseous findings are evident. Telemetry leads overlie the chest. IMPRESSION: No evidence of active cardiopulmonary process. 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 Electronically Signed   By: Richardean Sale M.D.   On: 06/06/2021 15:31   CT Head Wo Contrast  Result Date: 06/06/2021 CLINICAL DATA:  Head trauma, moderate-severe multiple falls with now some AMS EXAM: CT HEAD WITHOUT CONTRAST TECHNIQUE: Contiguous axial images were obtained from the base of the skull through the vertex without intravenous contrast. COMPARISON:  CT head April 02, 2021.  MRI April 05, 2021. FINDINGS: Brain: New trace hyperdensity along the posterior falx (for example series 4, image 84; series 3, image 19), compatible with acute subdural hemorrhage. No evidence of acute large vascular territory infarct. Prior infarcts in the right PCA territory, left middle cerebellar peduncle, and inferior left cerebellum. Additional moderate to severe patchy white matter hypoattenuation, nonspecific but compatible with chronic microvascular ischemic disease. No hydrocephalus, mass lesion, or abnormal mass effect. Vascular: No hyperdense vessel identified. Calcific intracranial atherosclerosis. Skull: No acute fracture. Sinuses/Orbits: Mild nasal sinus mucosal thickening with evidence of prior Skopic sinus surgery. Unremarkable orbits. No mastoid effusions. Other: No mastoid effusions. IMPRESSION: 1. Acute trace subdural hemorrhage along the posterior falx. 2. 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 Findings discussed Dr. Sherry Ruffing via telephone at 4:20 PM. Electronically Signed   By: Margaretha Sheffield M.D.   On: 06/06/2021 16:26    _______________________________________________________________________________________________________ Latest  Blood pressure (!) 155/77, pulse 84, temperature (!) 100.4 F (38 C), temperature source Oral, resp. rate 18, height 5\' 8"  (1.727 m), weight 90.7 kg, SpO2 94 %.   Review of Systems:    Pertinent positives include:   Fevers, chills, fatigue, urgency or frequency.  Constitutional:  No weight loss, night sweats weight loss  HEENT:  No headaches, Difficulty swallowing,Tooth/dental problems,Sore throat,  No sneezing, itching, ear ache, nasal congestion, post nasal drip,  Cardio-vascular:  No chest pain, Orthopnea, PND, anasarca, dizziness, palpitations.no Bilateral lower extremity swelling  GI:  No heartburn, indigestion, abdominal pain, nausea, vomiting, diarrhea, change in bowel habits, loss of appetite, melena, blood in stool, hematemesis Resp:  no shortness of breath at rest. No dyspnea on exertion, No excess mucus, no productive cough, No non-productive cough, No coughing up of blood.No change in color of mucus.No wheezing. Skin:  no rash or lesions. No jaundice GU:  no dysuria, change in color of urine, no  No straining to urinate.  No flank pain.  Musculoskeletal:  No joint pain or no joint swelling. No decreased range of motion. No back pain.  Psych:  No change in mood or affect. No depression or anxiety. No memory loss.  Neuro: no localizing neurological complaints, no tingling, no weakness, no double vision, no gait abnormality, no slurred speech, no confusion  All systems reviewed and apart from HOPI all are negative _______________________________________________________________________________________________ Past Medical History:   Past Medical History:  Diagnosis Date   CKD (chronic kidney disease) stage 3, GFR 30-59 ml/min (HCC)    Hypertension    Kidney stones    Sleep apnea       Past Surgical History:  Procedure Laterality Date   CATARACT  EXTRACTION     IR NEPHROSTOMY PLACEMENT RIGHT  04/07/2021   IR PATIENT EVAL TECH 0-60 MINS  06/02/2021   kidney stent     MANDIBLE FRACTURE SURGERY      Social History:  Ambulatory  walker        reports that he has never smoked. He has never used smokeless tobacco. He reports current alcohol use. No history on file for drug use.   Family History:   Family History  Problem Relation Age of Onset   Hypertension Mother    Hypertension Other    ______________________________________________________________________________________________ Allergies: Allergies  Allergen Reactions   Statins Other (See Comments)    Pt reports severe weakness and mobility issues with his med, as well as diarrhea     Prior to Admission medications   Medication Sig Start Date End Date Taking? Authorizing Provider  amLODipine (NORVASC) 5 MG tablet Take 1 tablet (5 mg total) by mouth daily. 04/14/21   Lanae BoastKc, Ramesh, MD  aspirin 81 MG chewable tablet Chew 1 tablet (81 mg total) by mouth daily. 04/14/21   Lanae BoastKc, Ramesh, MD  clopidogrel (PLAVIX) 75 MG tablet Take 1 tablet (75 mg total) by mouth daily. 04/05/21 07/04/21  Lanae BoastKc, Ramesh, MD  FLUoxetine (PROZAC) 40 MG capsule Take 40 mg by mouth daily. Patient not taking: Reported on 04/09/2021    [provider]  lactobacillus (FLORANEX/LACTINEX) PACK Take 1 packet (1 g total) by mouth 2 (two) times daily. 04/13/21   Lanae BoastKc, Ramesh, MD  melatonin 3 MG TABS tablet Take 3 mg by mouth at bedtime. Patient not taking: Reported on 04/09/2021    [provider]  Multiple Vitamins-Minerals (MULTIVITAMIN WITH MINERALS) tablet Take 1 tablet by mouth daily. Patient not taking: Reported on 04/09/2021    [provider]    ___________________________________________________________________________________________________ Physical Exam: Vitals with BMI 06/06/2021 06/06/2021 06/06/2021  Height - - -  Weight - - -  BMI - - -  Systolic 155 146 [id number]   Diastolic 77 81 68  Pulse 84 86 83     1. General:  in No  Acute distress   Chronically ill   -appearing 2. Psychological: Alert and  Oriented 3. Head/ENT:    Dry Mucous Membranes                          Head   traumatic, neck supple                          Normal  Dentition 4. SKIN:  decreased Skin turgor,  Skin clean Dry and intact no rash 5. Heart: Regular rate and rhythm no  Murmur, no Rub or gallop 6. Lungs:  no wheezes or crackles   7. Abdomen: Soft,  non-tender, Non distended   obese  bowel sounds present 8. Lower extremities: no clubbing, cyanosis, no  edema 9. Neurologically  strength 5 out of 5 in all 4 extremities cranial nerves II through XII intact 10. MSK: Normal range of motion  Right nephrostomy tube present   Chart has been reviewed  ______________________________________________________________________________________________  Assessment/Plan  75 y.o. male with medical history significant of  CKD stage 3a, HTN, OSA, depression/anxiety, hx of CVA on Plavix, hx of kidney stones   Admitted for Subdural hematoma mild, COVID infection and complex Uti  Present on Admission:  Subdural hematoma  COVID-19 virus infection  CKD (chronic kidney disease) stage 3a  Hypertension  Sleep apnea  Ureteral stent occlusion (HCC)  Hypokalemia  Frequent urination  Acute lower UTI  Ascending aortic aneurysm  Atrophy of right kidney  Anemia  Obesity (BMI 30.0-34.9)       Hospital Problem List as of 06/07/2021          Priority Resolved POA     High     * (Principal) Subdural hematoma High  Yes    Current Assessment & Plan 06/06/2021 Hospital Encounter Written 06/07/2021 12:28 AM by Toy Baker, MD     NEurosurgery aware feels it is small in size plan for Q4 neurochecks Repeat CT in am Hold ASA and Plavix        Medium      Ureteral stent occlusion ([place]) Medium   Yes    Current Assessment & Plan 06/06/2021 Hospital Encounter Edited 06/06/2021 11:51  PM by Toy Baker, MD     IR consult, NPO post midnight         Acute lower UTI Medium   Yes    Overview Addendum 06/07/2021 12:45 AM by Toy Baker, MD     Endorses frequent urination and fever Prior hx of Culture from PCN 10/14-rare Enterobacter cloacae-  (S- Cipro/zosyn/Cefepime).          Current Assessment & Plan 06/06/2021 Hospital Encounter Edited 06/07/2021 12:49 AM by Toy Baker, MD      - treat with cefepime with complex uti await results of urine culture and adjust antibiotic coverage as needed Would avoid cipro given hx of Aortic Anurism         Low     COVID-19 virus infection Low  Yes    Overview Signed 06/06/2021 11:21 PM by Toy Baker, MD     COVID-19 Labs  No results for input(s): DDIMER, FERRITIN, LDH, CRP in the last 72 hours.  Lab Results  Component Value Date   [place] (A) 06/06/2021   [place] NEGATIVE 04/12/2021   [place] NEGATIVE 04/01/2021   [place] NEGATIVE 03/11/2021         Current Assessment & Plan 06/06/2021 Hospital Encounter Edited 06/07/2021 12:29 AM by Toy Baker, MD     Cycle threshold 18.9 COVID infection -incidental finding patient is  vaccinated   boosted   recent fevers  CXR with no infiltrates  treatment is recommended if Cycle Threshold < 32    Not a candidate for Paxlovid due to hx of plavix use discussed with pharmacy Obtain inflammatory markers Pronate as needed   Supportive measures Avoid over aggressive fluid resuscitation Airborne precautions         Anemia Low  Yes    Overview Signed 06/07/2021 12:51 AM by Toy Baker, MD     Recent Labs    04/12/21 0459 04/14/21 0618 06/06/21 1448  HGB 10.4* 10.2* 12.4*        Current Assessment & Plan 06/06/2021 Hospital Encounter Written 06/07/2021 12:51 AM by Toy Baker,  MD     Improved continue to monitor        Unprioritized     Hypertension   Yes    Overview Addendum  06/06/2021 11:57 PM by Therisa Doyne, MD     On NOrvasc 5mg        Current Assessment & Plan 06/06/2021 Hospital Encounter Edited 06/07/2021 12:42 AM by 06/09/2021, MD     Will contiue norvasc        Sleep apnea   Yes    Overview Signed 06/07/2021 12:02 AM by 06/09/2021, MD     Per wife his CPAP is broken Not on bleed in O2       Current Assessment & Plan 06/06/2021 Hospital Encounter Written 06/07/2021 12:02 AM by 06/09/2021, MD     continue CPAP        Nonfunctioning kidney   Not Applicable    Current Assessment & Plan 06/06/2021 Hospital Encounter Written 06/07/2021 12:42 AM by 06/09/2021, MD     Emailed Urology Dr. 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     CKD (chronic kidney disease) stage 3a 1.  Yes    Overview Signed 06/07/2021 12:26 AM by 06/09/2021, MD     Baseline Cr 1.3       Current Assessment & Plan 06/06/2021 Hospital Encounter Written 06/06/2021 11:47 PM by 06/08/2021, MD      -chronic avoid nephrotoxic medications  such as NSAIDs, Vanco Zosyn combo,  avoid hypotension, continue to follow renal function         Obesity (BMI 30.0-34.9) 1.  Yes    Overview Signed 06/07/2021 12:52 AM by 06/09/2021, MD      Body mass index is 30.41 kg/m.        Current Assessment & Plan 06/06/2021 Hospital Encounter Written 06/07/2021 12:52 AM by 06/09/2021, MD     Follow up as an out pt        2.     Ascending aortic aneurysm 2.  Yes    Overview Addendum 06/07/2021  3:28 AM by 06/09/2021, MD     4.5 cm-seen in TTE, recommended follow-up CTA         Current Assessment & Plan 06/06/2021 Hospital Encounter Edited 06/07/2021  3:27 AM by 06/09/2021, MD     Stable follow up as outpt, BP control  4.     Atrophy of right kidney 4.  Yes    Overview Signed 06/07/2021 12:39 AM by Toy Baker, MD     Nonfunctional right kidney from an obstructed right distended with a large bladder stone  Plan for Nephrectomy in January 2023 had to defer due to need for plavix  followed up by Dr. Tresa Moore       Current Assessment & Plan 06/06/2021 Hospital Encounter Written 06/07/2021  3:29 AM by Toy Baker, MD     Pt follows up with urology have not emailed Dr. Tresa Moore the patient has been admitted        5.     Frequent urination 5.  Yes    Current Assessment & Plan 06/06/2021 Hospital Encounter Written 06/07/2021 12:41 AM by Toy Baker, MD     likely due to UTI        Other plan as per orders.  DVT prophylaxis:  SCD      Code Status:    Code Status: DNR  DNR/DNI  as per patient   I had personally discussed CODE STATUS with patient and family     Family Communication:   Family   at  Bedside  plan of care was discussed on the phone with  Wife,    Disposition Plan:      To home once workup is complete and patient is stable   Following barriers for discharge:                            Electrolytes corrected                                                            Will need consultants to evaluate patient prior to discharge                       Would benefit from PT/OT eval prior to DC  Ordered                   Swallow eval - SLP ordered                                       Transition of care consulted                   Nutrition    consulted                                  Consults called:   NEurosurgery was aware  consulted IR when for replacement of  nephrostomy tube   Emailed [place] that pt has been admitted Neurology is on board case discussed   Admission status:  ED Disposition     ED Disposition  [place]: [place] [100100]  Level of Care: Progressive [102]  Admit to Progressive based on following criteria: NEUROLOGICAL AND NEUROSURGICAL complex patients with significant risk of instability, who do not meet ICU criteria, yet require close observation or frequent assessment (< / =  every 2 - 4 hours) with medical / nursing intervention.  May admit patient to [facility] or [facility] if equivalent level of care is available:: Yes  Interfacility transfer: Yes  Covid Evaluation: Confirmed COVID Positive  Diagnosis: Subdural hematoma [id number]  Admitting Physician: Orma Flaming [id number]  Attending Physician: Orma Flaming [id number]  Estimated length of stay: past midnight tomorrow  Certification:: I certify this patient will need inpatient services for at least 2 midnights           inpatient     I Expect 2 midnight stay secondary to severity of patient's current illness need for inpatient interventions justified by the following:   Severe lab/radiological/exam abnormalities including:    Subdural hematoma and extensive comorbidities including:  CKD hx of CVA with residual deficit    That are currently affecting medical management.   I expect  patient to be hospitalized for 2 midnights requiring inpatient medical care.  Patient is  at high risk for adverse outcome (such as loss of life or disability) if not treated.  Indication for inpatient stay as follows:   Need for operative/procedural  intervention     Need for IV antibiotics, IV fluids,     Level of care    progressive tele indefinitely please discontinue once patient no longer qualifies COVID-19 Labs   Lab Results  Component Value Date   Dale (A) 06/06/2021     Precautions: admitted as  covid positive Airborne and Contact precautions    Haynes Giannotti 06/07/2021, 3:31 AM    [facility]     after 2 AM please page floor coverage PA If 7AM-7PM, please contact the day team taking care of the patient using [email or web address]   Patient was evaluated in the context of the global COVID-19 pandemic, which necessitated consideration that the patient might be at risk for infection with the SARS-CoV-2 virus that causes COVID-19. Institutional protocols and algorithms that pertain to the evaluation of patients at risk for COVID-19 are in a state of rapid change based on information released by regulatory bodies including the CDC and federal and state organizations. These policies and algorithms were followed during the patient's care.

## 2021-06-06 NOTE — Progress Notes (Signed)
Received a phone call from Facility: [facility]  Requesting MD: Dr. Rush Landmark Patient with h/o CKD stage 3a, HTN, OSA, depression/anxiety, hx of CVA, hx of kidney stones presenting with fever, concern for UTI and three falls in 4 hours with some confusion. He is on plavix. Admitted a few months ago with ureteral obstruction and has nephrostomy tube on the right ever since. Has has foul smelling drainage from tube.   Vitals: temp: 101.  Pertinent labs: potassium 3.3, COVID POSITIVE, UA with moderate leuk, few bacteria, 30 protein, large hgb. CXR with no acute findings.  CT head: acute trace subdural hemorrhage along the posterior falx. (On plavix)   Talked to IR. Dr. Rush Landmark did call out to neurosurgery. They have not called back.   Plan of care: admit to [facility]. put consult into IR when he gets here and will replace nephrostomy tube as he is due for this and possibly infected. Given rocephin. ? Confusion from UTI vs. Covid. Covid appears to be incidental. Not hypoxic, no findings on CXR, no cough. PT consult. Dr. Rush Landmark will update note after talks with neurosurgery.   The patient will be accepted for admission to progressive at [facility] when bed is available. Will place neurochecks on orders.    Nursing staff, Please call the [facility] Admits & Consults System-Wide number at the top of Amion at the time of the patient's arrival so that the patient can be paged to the admitting physician.  Lanney Gins, M.D. [facility]

## 2021-06-06 NOTE — Assessment & Plan Note (Signed)
-   will replace and repeat in AM,  check magnesium level and replace as needed ° °

## 2021-06-07 ENCOUNTER — Inpatient Hospital Stay (HOSPITAL_COMMUNITY): Admission: RE | Admit: 2021-06-07 | Payer: Self-pay | Source: Ambulatory Visit

## 2021-06-07 ENCOUNTER — Inpatient Hospital Stay (HOSPITAL_COMMUNITY): Payer: Medicare Other

## 2021-06-07 ENCOUNTER — Encounter (HOSPITAL_COMMUNITY): Payer: Self-pay | Admitting: Internal Medicine

## 2021-06-07 DIAGNOSIS — E669 Obesity, unspecified: Secondary | ICD-10-CM | POA: Diagnosis present

## 2021-06-07 DIAGNOSIS — E66811 Obesity, class 1: Secondary | ICD-10-CM | POA: Diagnosis present

## 2021-06-07 DIAGNOSIS — R350 Frequency of micturition: Secondary | ICD-10-CM | POA: Diagnosis present

## 2021-06-07 DIAGNOSIS — D649 Anemia, unspecified: Secondary | ICD-10-CM | POA: Diagnosis present

## 2021-06-07 DIAGNOSIS — N261 Atrophy of kidney (terminal): Secondary | ICD-10-CM | POA: Diagnosis present

## 2021-06-07 DIAGNOSIS — N390 Urinary tract infection, site not specified: Secondary | ICD-10-CM | POA: Diagnosis present

## 2021-06-07 DIAGNOSIS — I7121 Aneurysm of the ascending aorta, without rupture: Secondary | ICD-10-CM | POA: Diagnosis present

## 2021-06-07 HISTORY — PX: IR NEPHROSTOMY EXCHANGE RIGHT: IMG6070

## 2021-06-07 LAB — CBC WITH DIFFERENTIAL/PLATELET
Abs Immature Granulocytes: 0.01 10*3/uL (ref 0.00–0.07)
Basophils Absolute: 0 10*3/uL (ref 0.0–0.1)
Basophils Relative: 0 %
Eosinophils Absolute: 0 10*3/uL (ref 0.0–0.5)
Eosinophils Relative: 1 %
HCT: 38.4 % — ABNORMAL LOW (ref 39.0–52.0)
Hemoglobin: 12.5 g/dL — ABNORMAL LOW (ref 13.0–17.0)
Immature Granulocytes: 0 %
Lymphocytes Relative: 16 %
Lymphs Abs: 0.9 10*3/uL (ref 0.7–4.0)
MCH: 28.8 pg (ref 26.0–34.0)
MCHC: 32.6 g/dL (ref 30.0–36.0)
MCV: 88.5 fL (ref 80.0–100.0)
Monocytes Absolute: 0.7 10*3/uL (ref 0.1–1.0)
Monocytes Relative: 13 %
Neutro Abs: 3.9 10*3/uL (ref 1.7–7.7)
Neutrophils Relative %: 70 %
Platelets: 191 10*3/uL (ref 150–400)
RBC: 4.34 MIL/uL (ref 4.22–5.81)
RDW: 16.1 % — ABNORMAL HIGH (ref 11.5–15.5)
WBC: 5.5 10*3/uL (ref 4.0–10.5)
nRBC: 0 % (ref 0.0–0.2)

## 2021-06-07 LAB — COMPREHENSIVE METABOLIC PANEL
ALT: 18 U/L (ref 0–44)
AST: 17 U/L (ref 15–41)
Albumin: 3.3 g/dL — ABNORMAL LOW (ref 3.5–5.0)
Alkaline Phosphatase: 58 U/L (ref 38–126)
Anion gap: 9 (ref 5–15)
BUN: 11 mg/dL (ref 8–23)
CO2: 28 mmol/L (ref 22–32)
Calcium: 8.8 mg/dL — ABNORMAL LOW (ref 8.9–10.3)
Chloride: 102 mmol/L (ref 98–111)
Creatinine, Ser: 1.3 mg/dL — ABNORMAL HIGH (ref 0.61–1.24)
GFR, Estimated: 58 mL/min — ABNORMAL LOW (ref >60)
Glucose, Bld: 100 mg/dL — ABNORMAL HIGH (ref 70–99)
Potassium: 2.8 mmol/L — ABNORMAL LOW (ref 3.5–5.1)
Sodium: 139 mmol/L (ref 135–145)
Total Bilirubin: 0.8 mg/dL (ref 0.3–1.2)
Total Protein: 6.5 g/dL (ref 6.5–8.1)

## 2021-06-07 LAB — AMMONIA: Ammonia: 19 umol/L (ref 9–35)

## 2021-06-07 LAB — PHOSPHORUS
Phosphorus: 3.6 mg/dL (ref 2.5–4.6)
Phosphorus: 3.5 mg/dL (ref 2.5–4.6)

## 2021-06-07 LAB — BASIC METABOLIC PANEL
Anion gap: 9 (ref 5–15)
BUN: 12 mg/dL (ref 8–23)
CO2: 28 mmol/L (ref 22–32)
Calcium: 8.9 mg/dL (ref 8.9–10.3)
Chloride: 103 mmol/L (ref 98–111)
Creatinine, Ser: 1.27 mg/dL — ABNORMAL HIGH (ref 0.61–1.24)
GFR, Estimated: 59 mL/min — ABNORMAL LOW (ref >60)
Glucose, Bld: 83 mg/dL (ref 70–99)
Potassium: 3 mmol/L — ABNORMAL LOW (ref 3.5–5.1)
Sodium: 140 mmol/L (ref 135–145)

## 2021-06-07 LAB — TROPONIN I (HIGH SENSITIVITY)
Troponin I (High Sensitivity): 12 ng/L (ref <18)
Troponin I (High Sensitivity): 18 ng/L — ABNORMAL HIGH (ref <18)

## 2021-06-07 LAB — CK: Total CK: 68 U/L (ref 49–397)

## 2021-06-07 LAB — LACTATE DEHYDROGENASE: LDH: 159 U/L (ref 98–192)

## 2021-06-07 LAB — BRAIN NATRIURETIC PEPTIDE: B Natriuretic Peptide: 66.6 pg/mL (ref 0.0–100.0)

## 2021-06-07 LAB — URINE CULTURE: Culture: <10000 — AB

## 2021-06-07 LAB — TSH: TSH: 0.915 u[IU]/mL (ref 0.350–4.500)

## 2021-06-07 LAB — MAGNESIUM
Magnesium: 2.2 mg/dL (ref 1.7–2.4)
Magnesium: 2.1 mg/dL (ref 1.7–2.4)

## 2021-06-07 LAB — PROCALCITONIN: Procalcitonin: <0.1 ng/mL

## 2021-06-07 IMAGING — XA IR EXCHANGE NEPHROSTOMY RIGHT
2 series · 10 of 10 positions shown · non-contrast
Comparison: [DATE]

INDICATION: Chronic indwelling nephrostomy

EXAM:
FLUOROSCOPIC RIGHT NEPHROSTOMY EXCHANGE

[Series 1: fl (-) angio · 2 acquisitions, 5 frames shown (1 of 2)]
[im 1/2]
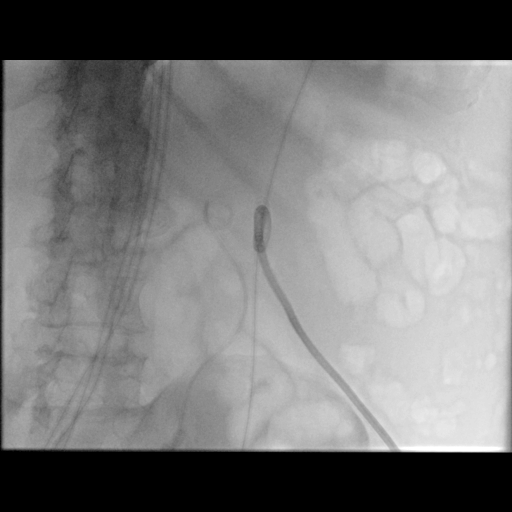
[im 1/2]
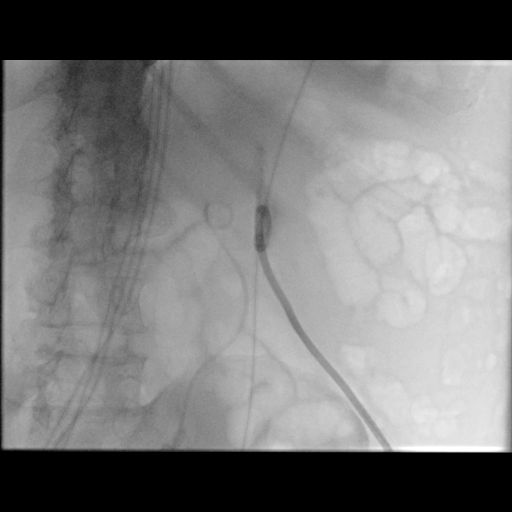
[im 1/2]
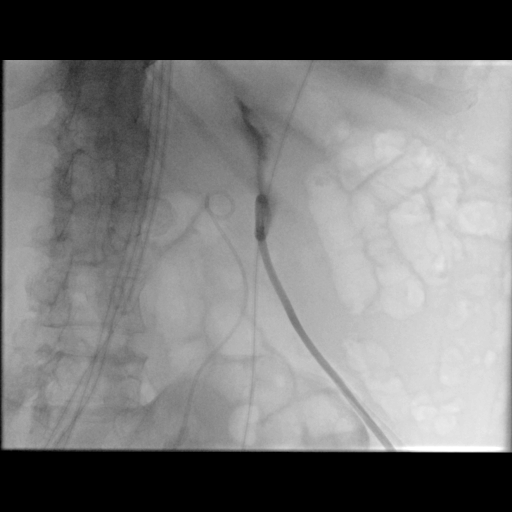
[im 1/2]
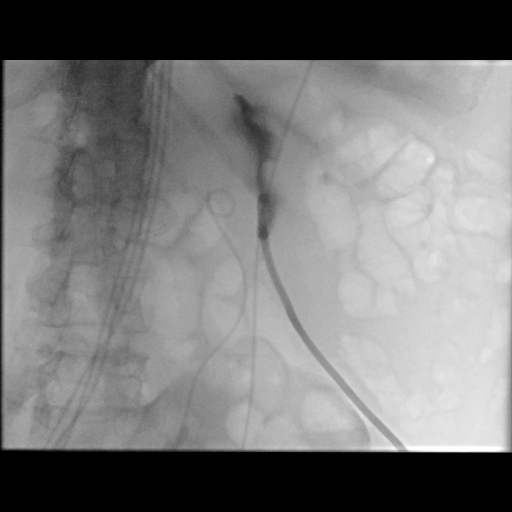
[im 2/2]
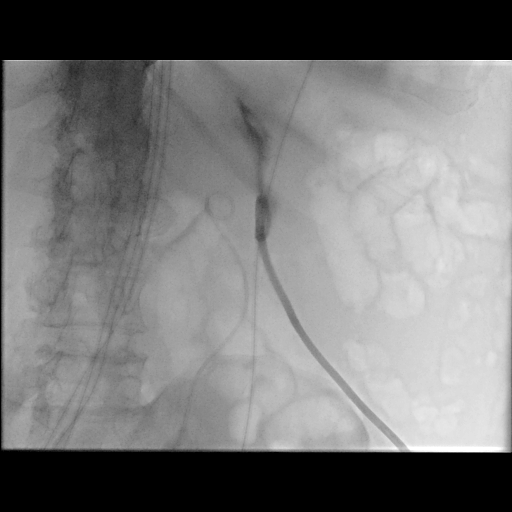

[Series 2: fl (-) angio · 2 acquisitions, 5 frames shown (2 of 2)]
[im 1/2]
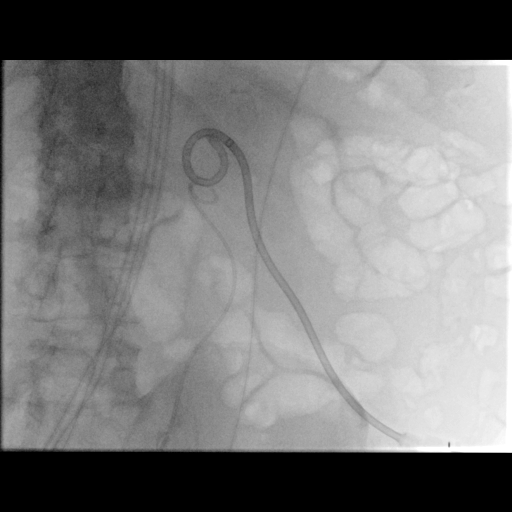
[im 1/2]
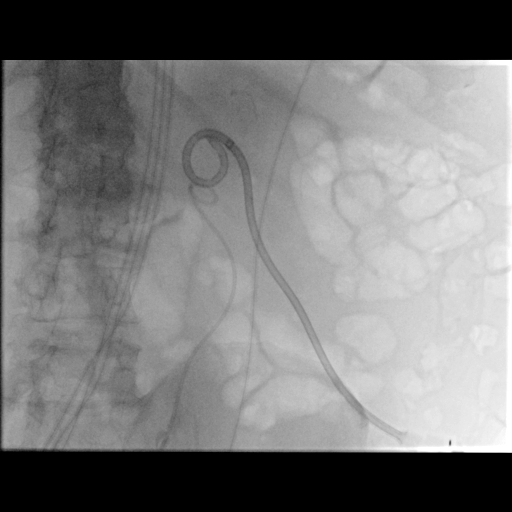
[im 1/2]
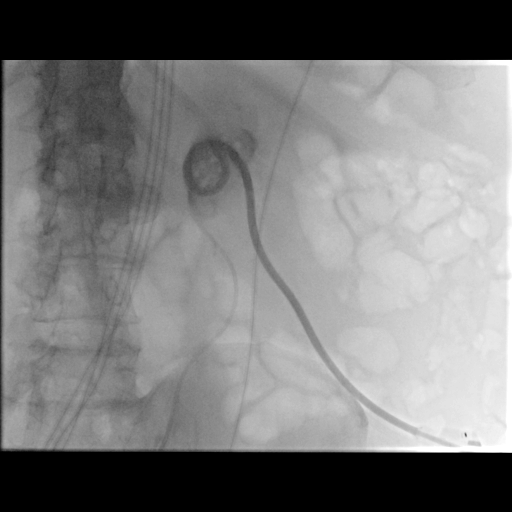
[im 1/2]
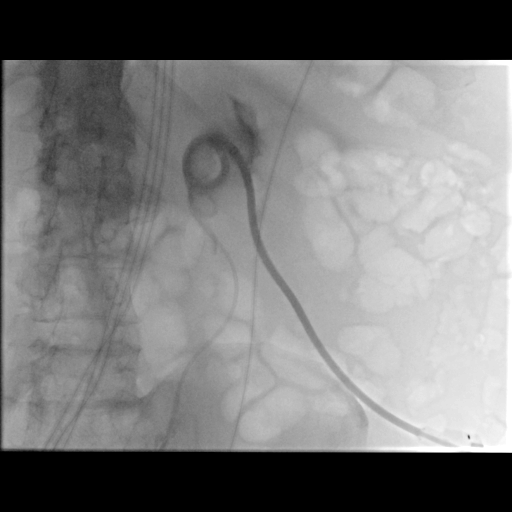
[im 2/2]
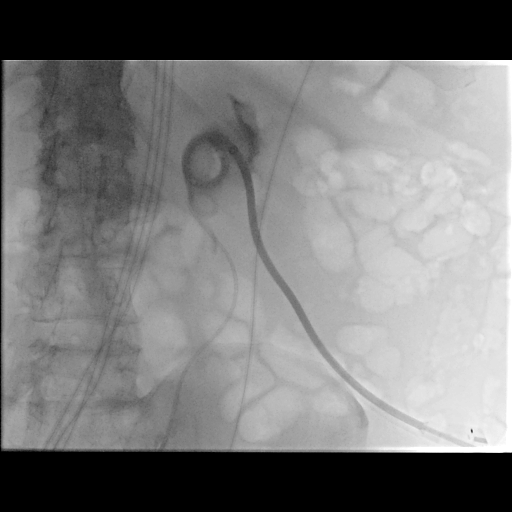

[10 of 10 positions shown; findings below may reference images not displayed]

MEDICATIONS:
1% LIDOCAINE LOCAL

ANESTHESIA/SEDATION:
Total intra-service moderate Sedation Time: NONE. The patient's
level of consciousness and vital signs were monitored continuously
by radiology nursing throughout the procedure under my direct
supervision.

CONTRAST:  15 cc-administered into the collecting system(s)

FLUOROSCOPY TIME:  Fluoroscopy Time: 0 minutes 24 seconds (2.6 mGy).

COMPLICATIONS:
None immediate.

PROCEDURE:
Informed written consent was obtained from the patient after a
thorough discussion of the procedural risks, benefits and
alternatives. All questions were addressed. Maximal Sterile Barrier
Technique was utilized including caps, mask, sterile gowns, sterile
gloves, sterile drape, hand hygiene and skin antiseptic. A timeout
was performed prior to the initiation of the procedure.

Under sterile conditions and local anesthesia, the 14 French right
nephrostomy catheter was exchanged successfully over an Amplatz
guidewire under fluoroscopy. Retention loop formed the renal pelvis.
Contrast injection confirms position. Catheter secured with 0 silk
suture. Sterile dressing applied. Gravity drainage bag connected.
IMPRESSION: Successful 14 French right nephrostomy exchange.

## 2021-06-07 IMAGING — CT CT HEAD W/O CM
3 of 4 series · 14 of 47 positions shown, 16 images · non-contrast
Comparison: Head CT from yesterday

CLINICAL DATA: Subdural hematoma

EXAM:
CT HEAD WITHOUT CONTRAST
TECHNIQUE: Contiguous axial images were obtained from the base of the skull
through the vertex without intravenous contrast.

[Series 5: head 3.0 mpr cor · coronal · 0.38mm/px · 3 of 75 slices shown]
[im 28/75  brain]
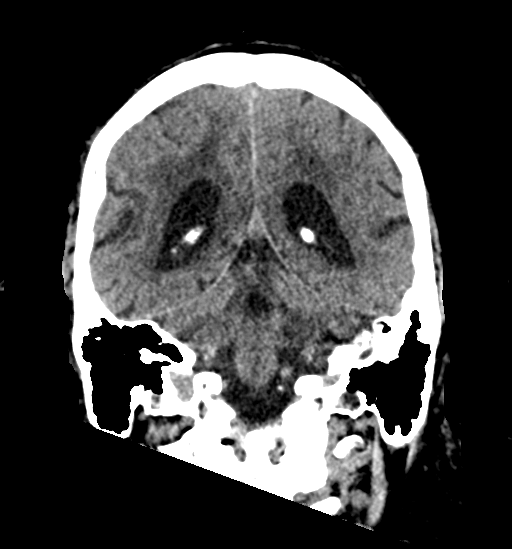
[im 34/75  brain]
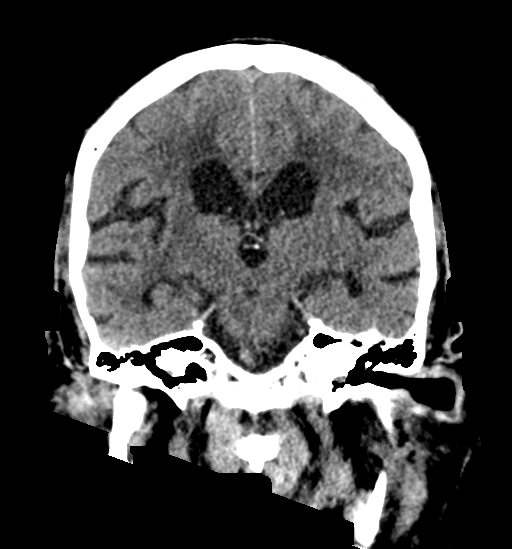
[im 41/75  brain]
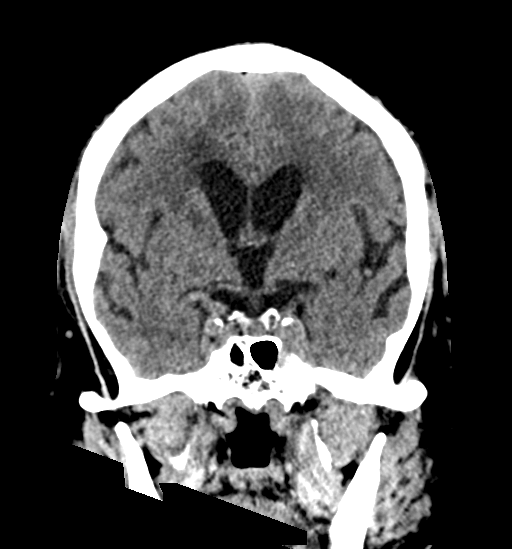

[Series 6: head 3.0 mpr sag · sagittal · 0.38mm/px · 3 of 67 slices shown]
[im 30/67  brain]
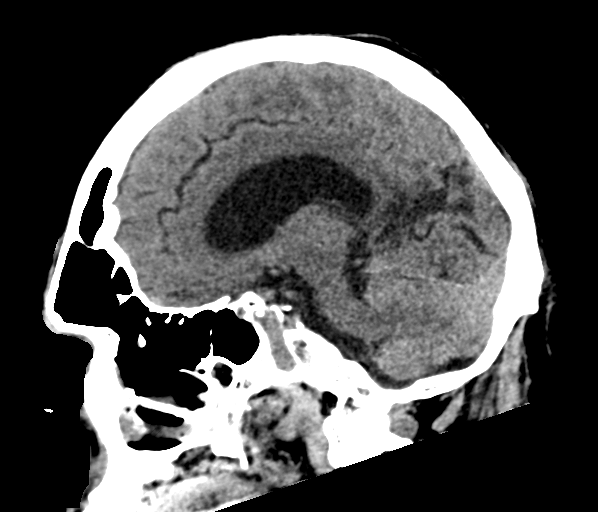
[im 34/67  brain]
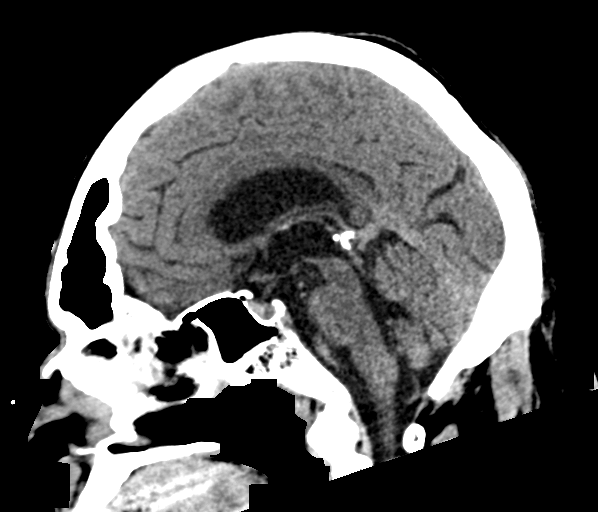
[im 38/67  brain]
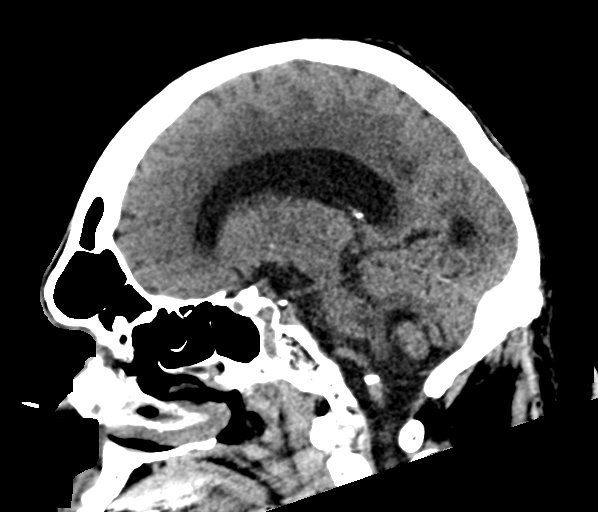

[Series 7: head 5.0 mpr ax · axial · 0.40mm/px · z∈[-167,-15]mm · 8 of 41 slices shown, 10 images]
[im 3/41  brain]
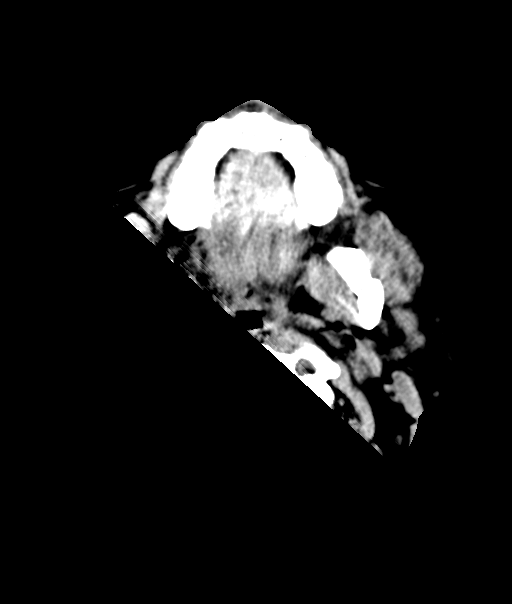
[im 3/41  bone]
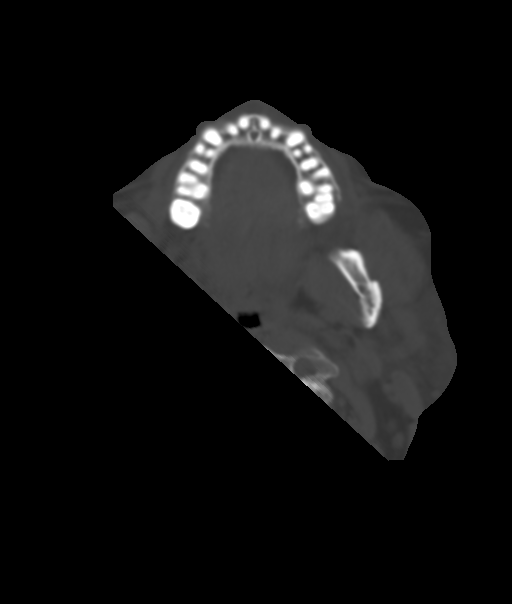
[im 9/41  brain]
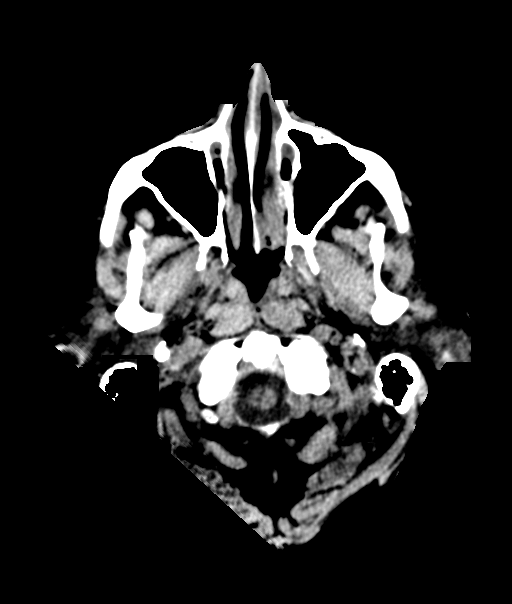
[im 14/41  brain]
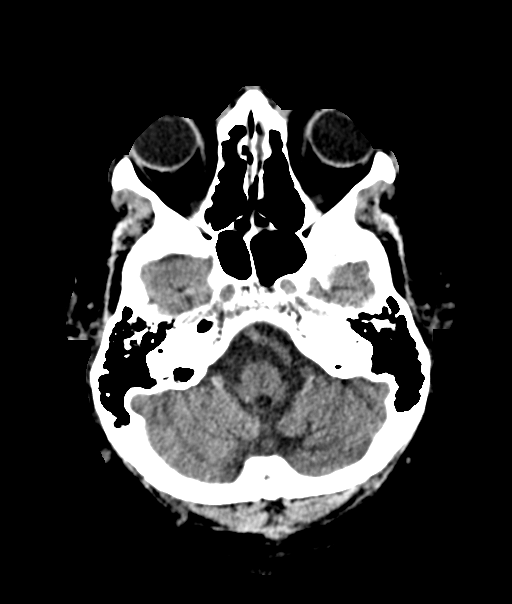
[im 19/41  brain]
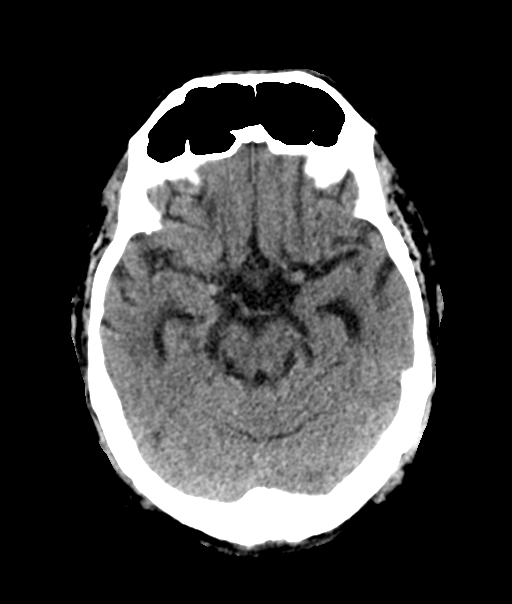
[im 22/41  brain]
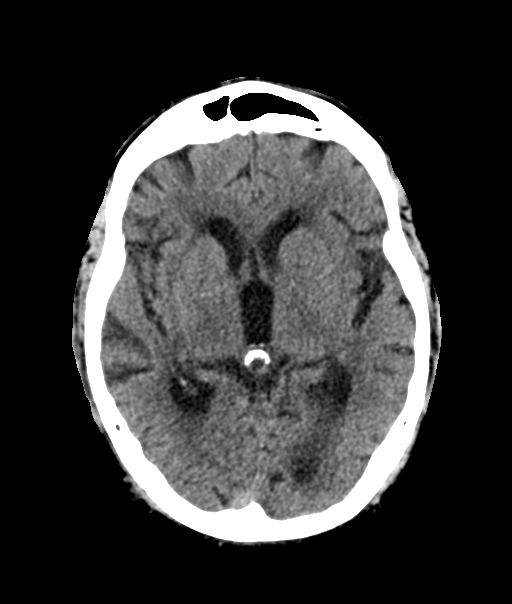
[im 22/41  bone]
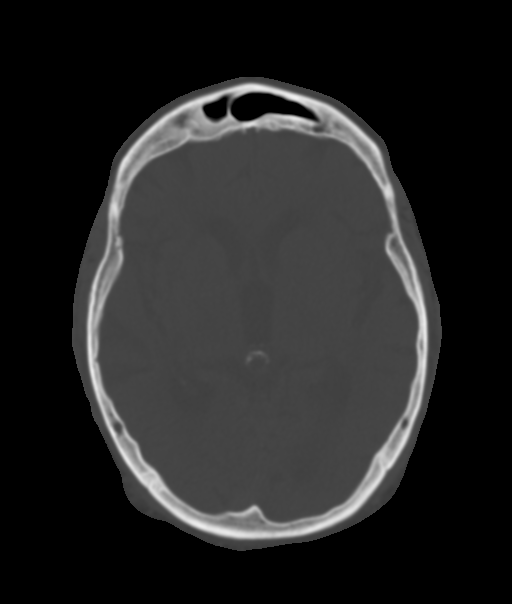
[im 27/41  brain]
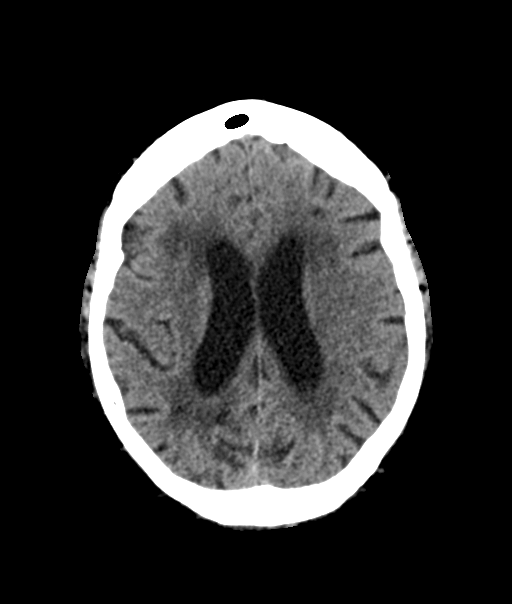
[im 33/41  brain]
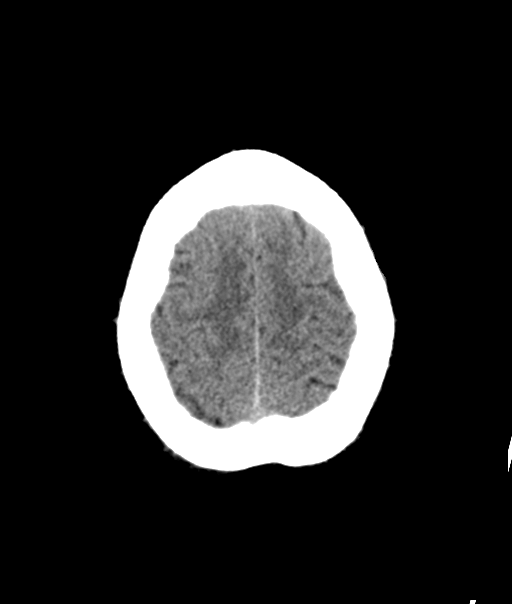
[im 38/41  brain]
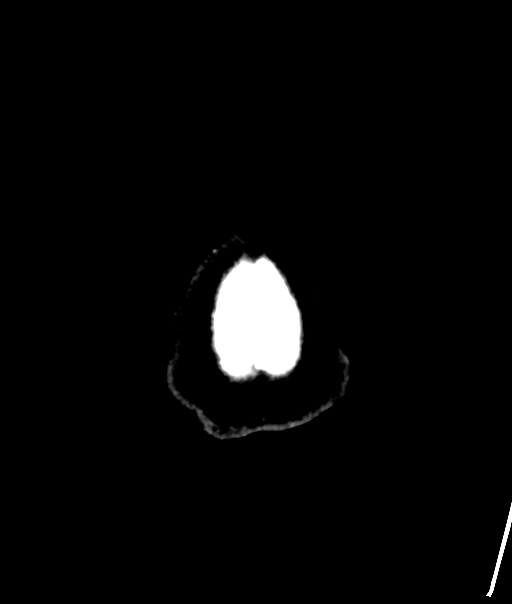

[14 of 47 positions shown; findings below may reference images not displayed]

FINDINGS: Brain: Trace subdural hematoma along the posterior falx without
progression. Chronic right occipital and small-vessel subcortical
infarcts. Confluent ischemic gliosis and brain atrophy.

Vascular: No hyperdense vessel or unexpected calcification.

Skull: Normal. Negative for fracture or focal lesion.

Sinuses/Orbits: No acute finding
IMPRESSION: 1. No progression of trace subdural hematoma.  No new abnormality.
2. Advanced chronic ischemic injury.

## 2021-06-07 MED ORDER — LIDOCAINE HCL 1 % IJ SOLN
INTRAMUSCULAR | Status: AC
  Filled 2021-06-07: qty 20

## 2021-06-07 MED ORDER — AMLODIPINE BESYLATE 5 MG PO TABS
5.0000 mg | ORAL_TABLET | Freq: Every day | ORAL | Status: DC
  Administered 2021-06-07 – 2021-06-08 (×2): 5 mg via ORAL
  Filled 2021-06-07 (×2): qty 1

## 2021-06-07 MED ORDER — ACETAMINOPHEN 650 MG RE SUPP: 650.0000 mg | Freq: Four times a day (QID) | RECTAL | Status: DC | PRN

## 2021-06-07 MED ORDER — ACETAMINOPHEN 325 MG PO TABS
650.0000 mg | ORAL_TABLET | Freq: Four times a day (QID) | ORAL | Status: DC | PRN
  Administered 2021-06-07: 17:00:00 650 mg via ORAL
  Filled 2021-06-07: qty 2

## 2021-06-07 MED ORDER — SODIUM CHLORIDE 0.9 % IV SOLN
75.0000 mL/h | INTRAVENOUS | Status: DC
  Administered 2021-06-07: 04:00:00 75 mL/h via INTRAVENOUS

## 2021-06-07 MED ORDER — POTASSIUM CHLORIDE CRYS ER 20 MEQ PO TBCR
40.0000 meq | EXTENDED_RELEASE_TABLET | Freq: Once | ORAL | Status: AC
  Administered 2021-06-07: 07:00:00 40 meq via ORAL
  Filled 2021-06-07: qty 2

## 2021-06-07 MED ORDER — HYDROCODONE-ACETAMINOPHEN 5-325 MG PO TABS: 1.0000 | ORAL_TABLET | ORAL | Status: DC | PRN

## 2021-06-07 MED ORDER — POTASSIUM CHLORIDE 10 MEQ/100ML IV SOLN
10.0000 meq | INTRAVENOUS | Status: AC
  Administered 2021-06-07 (×4): 10 meq via INTRAVENOUS
  Filled 2021-06-07 (×4): qty 100

## 2021-06-07 MED ORDER — MELATONIN 3 MG PO TABS
3.0000 mg | ORAL_TABLET | Freq: Every day | ORAL | Status: DC
  Administered 2021-06-07: 22:00:00 3 mg via ORAL
  Filled 2021-06-07: qty 1

## 2021-06-07 MED ORDER — IOHEXOL 300 MG/ML  SOLN
100.0000 mL | Freq: Once | INTRAMUSCULAR | Status: AC | PRN
  Administered 2021-06-07: 16:00:00 15 mL

## 2021-06-07 MED ORDER — SODIUM CHLORIDE 0.9 % IV SOLN
2.0000 g | Freq: Two times a day (BID) | INTRAVENOUS | Status: DC
  Administered 2021-06-07 – 2021-06-08 (×4): 2 g via INTRAVENOUS
  Filled 2021-06-07 (×4): qty 2

## 2021-06-07 MED ORDER — ONDANSETRON HCL 4 MG/2ML IJ SOLN
INTRAMUSCULAR | Status: AC
  Administered 2021-06-07: 16:00:00 4 mg via INTRAVENOUS
  Filled 2021-06-07: qty 2

## 2021-06-07 MED ORDER — ONDANSETRON HCL 4 MG/2ML IJ SOLN: 4.0000 mg | Freq: Once | INTRAMUSCULAR | Status: AC

## 2021-06-07 NOTE — Assessment & Plan Note (Signed)
--  continue CPAP

## 2021-06-07 NOTE — Evaluation (Signed)
Occupational Therapy Evaluation Patient Details Name: Nicholas Mckenzie MRN: [id number] DOB: Jun 28, 1945 Today's Date: 06/07/2021   History of Present Illness 75 y.o. male presents to [facility] hospital on 06/06/2021 after fall with head injury at home. PT found to have SDH as well as UTI and incidental COVID+. Pt underwent R PCN exchange on 06/07/2021. PMH includes CKD stage IIIa, HTN, OSA, depression/anxiety, CVA with residual left hemiparesis.   Clinical Impression   Pt admitted for concerns listed above. PTA pt reported that he was using a RW for functional mobility in the home and his wife was assisting with most ADL's and all IADL's. At this time, pt is near his baseline from recent stroke, completing functional mobility at min guard level and ADL's with min guard to min A. LUE continues to be weaker, most likely due to mild inattention. Pt also continues to demonstrate L hemianopsia, which OT provided education on compensatory strategies. Recommending HHOT at this time and spoke with Pt and his wife about possibly following up with Out Patient neuro therapies as his endurance improves.  OT will continue to follow acutely.      Recommendations for follow up therapy are one component of a multi-disciplinary discharge planning process, led by the attending physician.  Recommendations may be updated based on patient status, additional functional criteria and insurance authorization.   Follow Up Recommendations  Home health OT    Assistance Recommended at Discharge Intermittent Supervision/Assistance  Functional Status Assessment  Patient has had a recent decline in their functional status and demonstrates the ability to make significant improvements in function in a reasonable and predictable amount of time.  Equipment Recommendations  None recommended by OT    Recommendations for Other Services       Precautions / Restrictions Precautions Precautions: Fall Precaution Comments: residual R  weakness Restrictions Weight Bearing Restrictions: No      Mobility Bed Mobility Overal bed mobility: Needs Assistance Bed Mobility: Rolling;Sidelying to Sit Rolling: Supervision Sidelying to sit: Min assist       General bed mobility comments: verbal cues for technique, use of railing to assist in roll and sidelying to sit.    Transfers Overall transfer level: Needs assistance Equipment used: Rolling walker (2 wheels) Transfers: Sit to/from Stand Sit to Stand: Min guard           General transfer comment: Min G from bed level for safety      Balance Overall balance assessment: Needs assistance Sitting-balance support: No upper extremity supported;Feet supported Sitting balance-Leahy Scale: Good     Standing balance support: Bilateral upper extremity supported;Reliant on assistive device for balance Standing balance-Leahy Scale: Poor                             ADL either performed or assessed with clinical judgement   ADL Overall ADL's : Needs assistance/impaired Eating/Feeding: Set up;Sitting   Grooming: Set up;Sitting   Upper Body Bathing: Minimal assistance;Sitting   Lower Body Bathing: Moderate assistance;Sitting/lateral leans;Sit to/from stand   Upper Body Dressing : Set up;Sitting   Lower Body Dressing: Minimal assistance;Sitting/lateral leans;Sit to/from stand   Toilet Transfer: Minimal assistance;Ambulation   Toileting- Clothing Manipulation and Hygiene: Minimal assistance;Sitting/lateral lean;Sit to/from stand       Functional mobility during ADLs: Minimal assistance;Rolling walker (2 wheels) General ADL Comments: Pt requiring multimodal cuing for mobility and ADL's, attention wanders during task at times.     Vision Baseline Vision/History: 1 Wears  glasses Ability to See in Adequate Light: 2 Moderately impaired Patient Visual Report: Peripheral vision impairment Vision Assessment?: Vision impaired- to be further tested in  functional context Additional Comments: Pt with L hemianopsia     Perception     Praxis      Pertinent Vitals/Pain Pain Assessment: No/denies pain Pain Score: 1  Pain Location: R low back at PCN site Pain Descriptors / Indicators: Aching Pain Intervention(s): Monitored during session     Hand Dominance Right   Extremity/Trunk Assessment Upper Extremity Assessment Upper Extremity Assessment: LUE deficits/detail LUE Deficits / Details: Mild inattention, when ccued to focus on LUE, strength is equal to RUE LUE Sensation: WNL LUE Coordination: decreased fine motor   Lower Extremity Assessment Lower Extremity Assessment: Defer to PT evaluation RLE Deficits / Details: grossly 4-/5   Cervical / Trunk Assessment Cervical / Trunk Assessment: Normal   Communication Communication Communication: No difficulties   Cognition Arousal/Alertness: Awake/alert Behavior During Therapy: WFL for tasks assessed/performed Overall Cognitive Status: Impaired/Different from baseline Area of Impairment: Problem solving;Safety/judgement                 Orientation Level: Disoriented to;Time (pt reports december 15th, guesses day of week to be Sunday)       Safety/Judgement: Decreased awareness of safety;Decreased awareness of deficits   Problem Solving: Slow processing General Comments: Pt with good recall of past events, some slow processing with initiation     General Comments  VSS on RA    Exercises     Shoulder Instructions      Home Living Family/patient expects to be discharged to:: Private residence Living Arrangements: Spouse/significant other Available Help at Discharge: Family Type of Home: House Home Access: Ramped entrance     Home Layout: One level     Bathroom Shower/Tub: Arts development officer Toilet: Handicapped height     Home Equipment: Agricultural consultant (2 wheels);BSC/3in1          Prior Functioning/Environment Prior Level of Function : Needs  assist             Mobility Comments: pt ambulates household distances with use of walker. Spouse reports pt has fallen out of bed twice in recent weeks since CVA ADLs Comments: Pt wife assists        OT Problem List: Decreased strength;Decreased range of motion;Decreased activity tolerance;Impaired balance (sitting and/or standing);Impaired vision/perception;Decreased coordination;Decreased cognition;Decreased safety awareness;Decreased knowledge of use of DME or AE;Impaired UE functional use      OT Treatment/Interventions: Self-care/ADL training;Therapeutic exercise;Neuromuscular education;Energy conservation;DME and/or AE instruction;Therapeutic activities;Cognitive remediation/compensation;Visual/perceptual remediation/compensation;Patient/family education;Balance training    OT Goals(Current goals can be found in the care plan section) Acute Rehab OT Goals Patient Stated Goal: To be more independent OT Goal Formulation: With patient Time For Goal Achievement: 06/21/21 Potential to Achieve Goals: Good ADL Goals Pt/caregiver will Perform Home Exercise Program: Increased strength;Left upper extremity;Both right and left upper extremity;Independently;With theraband;With theraputty;With written HEP provided Additional ADL Goal #1: Pt will follow 2 step commands with no cuing 75% of the session Additional ADL Goal #2: Pt will complete a pathfinding activity with minimal assist.  OT Frequency: Min 2X/week   Barriers to D/C:            Co-evaluation              AM-PAC OT "6 Clicks" Daily Activity     Outcome Measure Help from another person eating meals?: A Little Help from another person taking care of personal  grooming?: A Little Help from another person toileting, which includes using toliet, bedpan, or urinal?: A Little Help from another person bathing (including washing, rinsing, drying)?: A Lot Help from another person to put on and taking off regular upper body  clothing?: A Little Help from another person to put on and taking off regular lower body clothing?: A Little 6 Click Score: 17   End of Session Equipment Utilized During Treatment: Gait belt;Rolling walker (2 wheels) Nurse Communication: Mobility status  Activity Tolerance: Patient tolerated treatment well Patient left: with call bell/phone within reach;in bed;with bed alarm set;with family/visitor present  OT Visit Diagnosis: Unsteadiness on feet (R26.81);Other abnormalities of gait and mobility (R26.89);Muscle weakness (generalized) (M62.81);Other symptoms and signs involving cognitive function                Time: [id number] OT Time Calculation (min): 66 min Charges:  OT General Charges $OT Visit: 1 Visit OT Evaluation $OT Eval Moderate Complexity: 1 Mod OT Treatments $Self Care/Home Management : 23-37 mins $Therapeutic Activity: 8-22 mins  Nollie Terlizzi H., OTR/L Acute Rehabilitation  Madysyn Hanken Elane Yolanda Bonine 06/07/2021, 6:37 PM

## 2021-06-07 NOTE — Plan of Care (Signed)
Patient arrived with EMS. Alert and oriented x4. Patient became more confused as night progressed. No acute distress noted. Wife at bedside. Admission assessment completed. Will continue to monitor.

## 2021-06-07 NOTE — Assessment & Plan Note (Addendum)
-   treat with cefepime with complex uti await results of urine culture and adjust antibiotic coverage as needed Would avoid cipro given hx of Aortic Anurism

## 2021-06-07 NOTE — Assessment & Plan Note (Signed)
NEurosurgery aware feels it is small in size plan for Q4 neurochecks Repeat CT in am Hold ASA and Plavix

## 2021-06-07 NOTE — Assessment & Plan Note (Addendum)
Stable follow up as outpt, BP control

## 2021-06-07 NOTE — TOC CAGE-AID Note (Signed)
Transition of Care [facility]) - CAGE-AID Screening   Patient Details  Name: Nicholas Mckenzie MRN: [id number] Date of Birth: 07-13-1945  [facility]) CM/SW Contact:    Erin Sons, LCSW Phone Number: 06/07/2021, 12:07 PM   Clinical Narrative:  CSW called pt on room phone; wife answered. She requested CSW call her phone which CSW did. Pt is able to speak with pt on wife's phone. CAGE-AID completed; score of 1. Pt states that he drinks once a week. He initially reports he has 1 beer per week. CSW inquired about the question he answered yes to (have people annoyed you by criticizing your drinking or drug use?). Pt reports that his wife things he drinks too much. CSW requests clarification on how much he drinks and he then reports sometimes he has 2-3 beers but that this is still once per week. Pt reports he has quit drinking on his own in the past. CSW discusses potential resources he can offer and discusses outpatient counseling as an option if pt were wanting more support in reducing alcohol use or quitting. He does indicate he would like to reduce his drinking and is agreeable to receive a resource list. CSW will provide list to RN to give to pt.   CAGE-AID Screening:    Have You Ever Felt You Ought to Cut Down on Your Drinking or Drug Use?: No Have People Annoyed You By Critizing Your Drinking Or Drug Use?: Yes Have You Felt Bad Or Guilty About Your Drinking Or Drug Use?: No Have You Ever Had a Drink or Used Drugs First Thing In The Morning to Steady Your Nerves or to Get Rid of a Hangover?: No CAGE-AID Score: 1  Substance Abuse Education Offered: Yes

## 2021-06-07 NOTE — Assessment & Plan Note (Signed)
Follow up as an outpt ?

## 2021-06-07 NOTE — Progress Notes (Signed)
PT Cancellation Note  Patient Details Name: Nicholas Mckenzie MRN: [id number] DOB: January 14, 1946   Cancelled Treatment:    Reason Eval/Treat Not Completed: Patient at procedure or test/unavailable. Pt currently off unit. Will check back as schedule allows to initiate PT evaluation.    Marylynn Pearson 06/07/2021, 3:24 PM  Conni Slipper, PT, DPT Acute Rehabilitation Services Pager: [phone] Office: [phone]

## 2021-06-07 NOTE — Assessment & Plan Note (Signed)
Hold aspirin and plavix, neurology have seen in consult  Will need to follow up  Possibly resume ASA in 2 wk or more

## 2021-06-07 NOTE — Assessment & Plan Note (Signed)
Pt follows up with urology have not emailed Dr. Berneice Heinrich the patient has been admitted

## 2021-06-07 NOTE — Assessment & Plan Note (Signed)
likely due to UTI

## 2021-06-07 NOTE — Assessment & Plan Note (Signed)
Will hold Aspirin and Plavix

## 2021-06-07 NOTE — Progress Notes (Addendum)
PROGRESS NOTE   Nicholas Mckenzie  [id number]    DOB: 08-12-1945    DOA: 06/06/2021  PCP: Nicholas Jewel, MD   I have briefly reviewed patients previous medical records in [facility].  Chief Complaint  Patient presents with   Fever    Brief Narrative:  75 year old married male, ambulates with the help of a walker, medical history significant for CKD stage IIIa, HTN, OSA, depression/anxiety, CVA with residual left hemiparesis, on aspirin and Plavix, kidney stones with ureteral obstruction and right percutaneous nephrostomy tube with plans to exchange 06/07/2021 and eventual nephrectomy in January 2023, sustained fall x3 with head injury at home, presented with fever with concerns of UTI, foul-smelling urine from percutaneous nephrostomy and confusion. Admitted for subdural hemorrhage, incidental COVID and complicated UTI.  Neurosurgery neurology consulted.   Assessment & Plan:  Principal Problem:   Subdural hematoma Active Problems:   CKD (chronic kidney disease) stage 3a   Hypertension   Sleep apnea   Ureteral stent occlusion (HCC)   Nonfunctioning kidney   COVID-19 virus infection   Hypokalemia   H/O ischemic right PCA stroke   Frequent urination   Acute lower UTI   Ascending aortic aneurysm   Atrophy of right kidney   Anemia   Obesity (BMI 30.0-34.9)   Subdural hematoma: CT head 12/13: Acute trace subdural hemorrhage along the posterior falx.  EDP discussed with neurosurgery on 12/13, they reviewed the images and due to very small and essentially asymptomatic subdural, they did not recommend any repeat imaging unless the patient developed headache or new symptoms.  They recommended conservative management and treating other causes of encephalopathy.  Neurology consulted, recommended holding aspirin and Plavix, repeat head CT the next day.  CT head 12/14: No progression of trace subdural hematoma.  No new abnormality.  Neurology communicated and had nothing further to add  and have signed off.  They did recommend holding antiplatelets until outpatient follow-up with neurology.  PT and OT input pending  Kidney stones with ureteral obstruction, s/p right percutaneous nephrostomy with complicated UTI: Follows with Dr. Tresa [facility], [facility].  Was to have a scheduled exchange of right percutaneous nephrostomy on 12/14, spouse indicates that is being scheduled as a last patient for today by IR.  Continue empiric cefepime pending urine culture results.  Did present with fever of 101 F.  Culture from PCN 10/14-rare Enterobacter cloacae.  Blood cultures x2: Negative to date.  Acute metabolic encephalopathy: Likely related to febrile illness/UTI complicating mild subdural and may be some underlying cognitive impairment.  Improved or even resolved.  Delirium precautions.  Incidental COVID-19 virus infection: Patient has been vaccinated and boosted.  No respiratory symptoms and not hypoxic.  Trend inflammatory markers.  Not a candidate for Paxil with due to history of Plavix use as discussed by admitting MD with pharmacy.  Airborne and contact isolation.  Chest x-ray without acute findings.  Mild anemia: Stable.  Hypertension: Mildly uncontrolled.  Continue amlodipine and adjust dose as needed  OSA: As per spouse, patient CPAP is broken.  CPAP at bedtime while here.  Outpatient follow-up with with PCP for new CPAP.  Nonfunctioning atrophic and obstructed right kidney: As per report, patient supposed to have nephrectomy in January 2023.  Admitting MD has emailed Dr. Tresa Moore of patient's admission.  Hypokalemia: Replace aggressively IV and p.o. and follow.  Magnesium 2.1.  History of ischemic right PCA stroke with residual left hemiparesis: Was supposed to be on aspirin 81 Mg + Plavix 75 Mg  x3 months followed by aspirin alone.  As communicated with neurology today, hold antiplatelets until close outpatient follow-up with neurology.  Stage IIIa chronic kidney disease: Baseline  creatinine likely fluctuates in the 1.2-1.4 range.  Currently at baseline.  Ascending aortic aneurysm: Outpatient follow-up.  Body mass index is 30.41 kg/m./Obesity     DVT prophylaxis: SCDs Start: 06/07/21 0300     Code Status: DNR Family Communication: Spouse at bedside Disposition:  Status is: Inpatient  Remains inpatient appropriate because: Severity of presentation including fever, small subdural hematoma, complicated UTI requiring IV antibiotics and exchanging right percutaneous nephrostomy which is the cause of his UTI.        Consultants:   Neurology Interventional radiology  Procedures:   Has right percutaneous nephrostomy from PTA.  Antimicrobials:   IV ceftriaxone x1 IV remdesivir x1 IV cefepime 12/13 >   Subjective:  Seen this morning with spouse at bedside.  Denies complaints.  No headache, visual symptoms, nausea or vomiting.  Reports chronic left-sided weakness which is unchanged.  No dyspnea, cough, chest pain.  Denies dysuria.  No flank pain.  Objective:   Vitals:   06/07/21 0412 06/07/21 0732 06/07/21 0830 06/07/21 1155  BP:   (!) 158/87   Pulse:   88   Resp:   14   Temp: (!) 100.4 F (38 C) 99.8 F (37.7 C)  (!) 101.3 F (38.5 C)  TempSrc: Oral Oral  Oral  SpO2:   94%   Weight:      Height:        General exam: Elderly male, moderately built and obese lying comfortably propped up in bed without distress.  Oral mucosa moist. Respiratory system: Clear to auscultation. Respiratory effort normal. Cardiovascular system: S1 & S2 heard, RRR. No JVD, murmurs, rubs, gallops or clicks. No pedal edema.  Telemetry personally reviewed: Sinus rhythm. Gastrointestinal system: Abdomen is nondistended, soft and nontender. No organomegaly or masses felt. Normal bowel sounds heard. GU: Right percutaneous nephrostomy site dressing clean and dry. Central nervous system: Alert and oriented. No focal neurological deficits. Extremities: Right limbs grade 5 x 5  power.  Left limbs 4+ by 5 power Skin: No rashes, lesions or ulcers Psychiatry: Judgement and insight appear normal. Mood & affect appropriate.     Data Reviewed:   I have personally reviewed following labs and imaging studies   CBC: Recent Labs  Lab 06/06/21 1448 06/07/21 0408  WBC 7.8 5.5  NEUTROABS 5.9 3.9  HGB 12.4* 12.5*  HCT 39.3 38.4*  MCV 87.9 88.5  PLT 210 [id number]    Basic Metabolic Panel: Recent Labs  Lab 06/06/21 1448 06/06/21 2328 06/07/21 0408  NA 141 140 139  K 3.3* 3.0* 2.8*  CL 102 103 102  CO2 29 28 28   GLUCOSE 95 83 100*  BUN 16 12 11   CREATININE 1.43* 1.27* 1.30*  CALCIUM 9.7 8.9 8.8*  MG  --  2.2 2.1  PHOS  --  3.6 3.5    Liver Function Tests: Recent Labs  Lab 06/06/21 1448 06/07/21 0408  AST 16 17  ALT 16 18  ALKPHOS 57 58  BILITOT 0.6 0.8  PROT 7.3 6.5  ALBUMIN 4.3 3.3*    CBG: No results for input(s): GLUCAP in the last 168 hours.  Microbiology Studies:   Recent Results (from the past 240 hour(s))  Culture, blood (Routine x 2)     Status: None (Preliminary result)   Collection Time: 06/06/21  2:50 PM   Specimen: BLOOD LEFT ARM  Result Value Ref Range Status   Specimen Description   Final    BLOOD LEFT ARM Performed at [facility], [address] [place], [place] [address]    Special Requests   Final    BOTTLES DRAWN AEROBIC AND ANAEROBIC Blood Culture adequate volume Performed at [facility], [address] [place], [place] [address]    Culture   Final    NO GROWTH < 24 HOURS Performed at [facility], [place] [address] [place], [place] [address]    Report Status PENDING  Incomplete  Resp Panel by RT-PCR (Flu A&B, Covid) Nasopharyngeal Swab     Status: Abnormal   Collection Time: 06/06/21  2:58 PM   Specimen: Nasopharyngeal Swab; Nasopharyngeal(NP) swabs in vial transport medium  Result Value Ref Range Status   SARS Coronavirus 2 by RT PCR POSITIVE (A) NEGATIVE Final     Comment: (NOTE) SARS-CoV-2 target nucleic acids are DETECTED.  The SARS-CoV-2 RNA is generally detectable in upper respiratory specimens during the acute phase of infection. Positive results are indicative of the presence of the identified virus, but do not rule out bacterial infection or co-infection with other pathogens not detected by the test. Clinical correlation with patient history and other diagnostic information is necessary to determine patient infection status. The expected result is Negative.  Fact Sheet for Patients: EntrepreneurPulse.com.au  Fact Sheet for Healthcare Providers: IncredibleEmployment.be  This test is not yet approved or cleared by the [place] FDA and  has been authorized for detection and/or diagnosis of SARS-CoV-2 by FDA under an Emergency Use Authorization (EUA).  This EUA will remain in effect (meaning this test can be used) for the duration of  the COVID-19 declaration under Section 564(b)(1) of the A ct, 21 U.S.C. section 360bbb-3(b)(1), unless the authorization is terminated or revoked sooner.     Influenza A by PCR NEGATIVE NEGATIVE Final   Influenza B by PCR NEGATIVE NEGATIVE Final    Comment: (NOTE) The Xpert Xpress SARS-CoV-2/FLU/RSV plus assay is intended as an aid in the diagnosis of influenza from Nasopharyngeal swab specimens and should not be used as a sole basis for treatment. Nasal washings and aspirates are unacceptable for Xpert Xpress SARS-CoV-2/FLU/RSV testing.  Fact Sheet for Patients: EntrepreneurPulse.com.au  Fact Sheet for Healthcare Providers: IncredibleEmployment.be  This test is not yet approved or cleared by the [place] FDA and has been authorized for detection and/or diagnosis of SARS-CoV-2 by FDA under an Emergency Use Authorization (EUA). This EUA will remain in effect (meaning this test can be used) for the duration of  the COVID-19 declaration under Section 564(b)(1) of the Act, 21 U.S.C. section 360bbb-3(b)(1), unless the authorization is terminated or revoked.  Performed at [facility], [address] [place], [place] [address]   Culture, blood (Routine x 2)     Status: None (Preliminary result)   Collection Time: 06/06/21  2:58 PM   Specimen: BLOOD RIGHT ARM  Result Value Ref Range Status   Specimen Description   Final    BLOOD RIGHT ARM Performed at [facility], [address], [place], [place] [address]    Special Requests   Final    BOTTLES DRAWN AEROBIC AND ANAEROBIC Blood Culture adequate volume Performed at [facility], [address] [place], [place] [address]    Culture   Final    NO GROWTH < 24 HOURS Performed at [facility], [place] [address], [place], [place] [address]  Report Status PENDING  Incomplete  Urine Culture     Status: Abnormal   Collection Time: 06/06/21  3:30 PM   Specimen: Urine, Catheterized  Result Value Ref Range Status   Specimen Description   Final    URINE, CATHETERIZED Performed at [facility], [address] [place], [place] [address]    Special Requests   Final    NONE Performed at [facility], [address] [place], [place] [address]    Culture (A)  Final    <10,000 COLONIES/mL INSIGNIFICANT GROWTH Performed at [facility] [address] [place], [place] [address]    Report Status 06/07/2021 FINAL  Final  Urine Culture     Status: Abnormal   Collection Time: 06/06/21  3:45 PM   Specimen: Urine, Clean Catch  Result Value Ref Range Status   Specimen Description   Final    URINE, CLEAN CATCH Performed at [facility], [address], [place], [place] [address]    Special Requests   Final    NONE Performed at [facility], [address] [place], [place] [address]    Culture  MULTIPLE SPECIES PRESENT, SUGGEST RECOLLECTION (A)  Final   Report Status 06/07/2021 FINAL  Final    Radiology Studies:  DG Chest 2 View  Result Date: 06/06/2021 CLINICAL DATA:  Fever and possible urinary tract infection. Suspected sepsis. EXAM: CHEST - 2 VIEW COMPARISON:  Limited correlation made with neck CT 02/02/2021 and abdominal CT 04/01/2021. FINDINGS: The heart size and mediastinal contours are stable with vascular tortuosity accounting for superior mediastinal widening, similar to prior CTA. The lungs appear clear. There is no pleural effusion or pneumothorax. Diffuse syndesmophytes are noted throughout the thoracic spine. No acute osseous findings are evident. Telemetry leads overlie the chest. IMPRESSION: No evidence of active cardiopulmonary process. 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 Electronically Signed   By: Richardean Sale M.D.   On: 06/06/2021 15:31   CT HEAD WO CONTRAST (5MM)  Result Date: 06/07/2021 CLINICAL DATA:  Subdural hematoma EXAM: CT HEAD WITHOUT CONTRAST TECHNIQUE: Contiguous axial images were obtained from the base of the skull through the vertex without intravenous contrast. COMPARISON:  Head CT from yesterday FINDINGS: Brain: Trace subdural hematoma along the posterior falx without progression. Chronic right occipital and small-vessel subcortical infarcts. Confluent ischemic gliosis and brain atrophy. Vascular: No hyperdense vessel or unexpected calcification. Skull: Normal. Negative for fracture or focal lesion. Sinuses/Orbits: No acute finding IMPRESSION: 1. No progression of trace subdural hematoma.  No new abnormality. 2. Advanced chronic ischemic injury. Electronically Signed   By: Jorje Guild M.D.   On: 06/07/2021 10:06   CT Head Wo Contrast  Result Date: 06/06/2021 CLINICAL DATA:  Head trauma, moderate-severe multiple falls with now some AMS EXAM: CT HEAD WITHOUT CONTRAST TECHNIQUE: Contiguous axial images were obtained from the base of the  skull through the vertex without intravenous contrast. COMPARISON:  CT head April 02, 2021.  MRI April 05, 2021. FINDINGS: Brain: New trace hyperdensity along the posterior falx (for example series 4, image 84; series 3, image 19), compatible with acute subdural hemorrhage. No evidence of acute large vascular territory infarct. Prior infarcts in the right PCA territory, left middle cerebellar peduncle, and inferior left cerebellum. Additional moderate to severe patchy white matter hypoattenuation, nonspecific but compatible with chronic microvascular ischemic disease. No hydrocephalus, mass lesion, or abnormal mass effect. Vascular: No hyperdense vessel identified. Calcific intracranial atherosclerosis. Skull: No acute fracture. Sinuses/Orbits: Mild nasal sinus  mucosal thickening with evidence of prior Skopic sinus surgery. Unremarkable orbits. No mastoid effusions. Other: No mastoid effusions. IMPRESSION: 1. Acute trace subdural hemorrhage along the posterior falx. 2. 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 Findings discussed Dr. Rush Landmark via telephone at 4:20 PM. Electronically Signed   By: Feliberto Harts M.D.   On: 06/06/2021 16:26   IR NEPHROSTOMY EXCHANGE RIGHT  Result Date: 06/07/2021 INDICATION: Chronic indwelling nephrostomy EXAM: FLUOROSCOPIC RIGHT NEPHROSTOMY EXCHANGE COMPARISON:  04/07/2021 MEDICATIONS: 1% LIDOCAINE LOCAL ANESTHESIA/SEDATION: Total intra-service moderate Sedation Time: NONE. The patient's level of consciousness and vital signs were monitored continuously by radiology nursing throughout the procedure under my direct supervision. CONTRAST:  15 cc-administered into the collecting system(s) FLUOROSCOPY TIME:  Fluoroscopy Time: 0 minutes 24 seconds (2.6 mGy). COMPLICATIONS: None immediate. PROCEDURE: Informed written consent was obtained from the patient after a thorough discussion of the procedural risks, benefits and alternatives. All  questions were addressed. Maximal Sterile Barrier Technique was utilized including caps, mask, sterile gowns, sterile gloves, sterile drape, hand hygiene and skin antiseptic. A timeout was performed prior to the initiation of the procedure. Under sterile conditions and local anesthesia, the 14 French right nephrostomy catheter was exchanged successfully over an Amplatz guidewire under fluoroscopy. Retention loop formed the renal pelvis. Contrast injection confirms position. Catheter secured with 0 silk suture. Sterile dressing applied. Gravity drainage bag connected. IMPRESSION: Successful 14 French right nephrostomy exchange. Electronically Signed   By: Judie Petit.  Shick M.D.   On: 06/07/2021 16:00    Scheduled Meds:    amLODipine  5 mg Oral Daily   lidocaine       melatonin  3 mg Oral QHS    Continuous Infusions:    ceFEPime (MAXIPIME) IV 2 g (06/07/21 1149)   remdesivir 100 mg in NS 100 mL 100 mg (06/07/21 1301)     LOS: 1 day     Marcellus Scott, MD,  FACP, [facility], [facility] (Care Management Physician Certified) [facility] [place]  To contact the attending provider between 7A-7P or the covering provider during after hours 7P-7A, please log into the web site [email or web address] and access using universal [place] password for that web site. If you do not have the password, please call the hospital operator.  06/07/2021, 4:16 PM

## 2021-06-07 NOTE — Evaluation (Signed)
Physical Therapy Evaluation Patient Details Name: Nicholas Mckenzie MRN: [id number] DOB: 1945-06-29 Today's Date: 06/07/2021  History of Present Illness  75 y.o. male presents to [facility] hospital on 06/06/2021 after fall with head injury at home. PT found to have SDH as well as UTI and incidental COVID+. Pt underwent R PCN exchange on 06/07/2021. PMH includes CKD stage IIIa, HTN, OSA, depression/anxiety, CVA with residual left hemiparesis.  Clinical Impression  Pt presents to PT with deficits in strength, power, gait, endurance, balance, functional mobility. Pt with residual R sided weakness from CVA in  October, resulting in significant gait deviations as documented below. With initial mobility pt demonstrates lack of LE power, however this improves with further mobility during session. Pt and spouse report gait to not be far from baseline, although endurance is limited at this time. Pt will benefit from aggressive mobilization during this admission to aide in a return to his prior level of function. PT recommends return home with continued HHPT and support from spouse.     Recommendations for follow up therapy are one component of a multi-disciplinary discharge planning process, led by the attending physician.  Recommendations may be updated based on patient status, additional functional criteria and insurance authorization.  Follow Up Recommendations Home health PT    Assistance Recommended at Discharge Intermittent Supervision/Assistance  Functional Status Assessment Patient has had a recent decline in their functional status and demonstrates the ability to make significant improvements in function in a reasonable and predictable amount of time.  Equipment Recommendations  None recommended by PT (pt owns necessary DME)    Recommendations for Other Services       Precautions / Restrictions Precautions Precautions: Fall Precaution Comments: residual R weakness Restrictions Weight Bearing  Restrictions: No      Mobility  Bed Mobility Overal bed mobility: Needs Assistance Bed Mobility: Rolling;Sidelying to Sit Rolling: Supervision Sidelying to sit: Min assist       General bed mobility comments: verbal cues for technique, use of railing to assist in roll and sidelying to sit. PT physical assist to elevate trunk into sitting    Transfers Overall transfer level: Needs assistance Equipment used: Rolling walker (2 wheels) Transfers: Sit to/from Stand Sit to Stand: Min guard;Min assist           General transfer comment: minG from elevated surface initiall, pt able to stand from lowest bed position after ambulating    Ambulation/Gait Ambulation/Gait assistance: Min guard Gait Distance (Feet): 30 Feet Assistive device: Rolling walker (2 wheels) Gait Pattern/deviations: Step-to pattern;Decreased step length - right Gait velocity: reduced Gait velocity interpretation: <1.31 ft/sec, indicative of household ambulator   General Gait Details: pt with slowed step-to gait, RLE consistently behind LLEwith short step-length. Pt and spouse report this to be baseline since CVA  Stairs            Wheelchair Mobility    Modified Rankin (Stroke Patients Only) Modified Rankin (Stroke Patients Only) Pre-Morbid Rankin Score: Moderate disability Modified Rankin: Moderately severe disability     Balance Overall balance assessment: Needs assistance Sitting-balance support: No upper extremity supported;Feet supported Sitting balance-Leahy Scale: Good     Standing balance support: Bilateral upper extremity supported;Reliant on assistive device for balance Standing balance-Leahy Scale: Poor                               Pertinent Vitals/Pain Pain Assessment: 0-10 Pain Score: 1  Pain Location: R low back  at PCN site Pain Descriptors / Indicators: Aching Pain Intervention(s): Monitored during session    Home Living Family/patient expects to be  discharged to:: Private residence Living Arrangements: Spouse/significant other Available Help at Discharge: Family Type of Home: House Home Access: Ramped entrance       Home Layout: One level Home Equipment: None      Prior Function Prior Level of Function : Needs assist             Mobility Comments: pt ambulates household distances with use of walker. Spouse reports pt has fallen out of bed twice in recent weeks since CVA       Hand Dominance   Dominant Hand: Right    Extremity/Trunk Assessment   Upper Extremity Assessment Upper Extremity Assessment: Defer to OT evaluation    Lower Extremity Assessment Lower Extremity Assessment: RLE deficits/detail RLE Deficits / Details: grossly 4-/5    Cervical / Trunk Assessment Cervical / Trunk Assessment: Normal  Communication   Communication: No difficulties  Cognition Arousal/Alertness: Awake/alert Behavior During Therapy: WFL for tasks assessed/performed Overall Cognitive Status: Impaired/Different from baseline Area of Impairment: Problem solving;Orientation;Safety/judgement                 Orientation Level: Disoriented to;Time (pt reports december 15th, guesses day of week to be Sunday)       Safety/Judgement: Decreased awareness of safety;Decreased awareness of deficits   Problem Solving: Slow processing          General Comments General comments (skin integrity, edema, etc.): VSS on RA    Exercises     Assessment/Plan    PT Assessment Patient needs continued PT services  PT Problem List Decreased strength;Decreased activity tolerance;Decreased balance;Decreased mobility;Decreased cognition;Decreased knowledge of precautions;Decreased safety awareness;Decreased knowledge of use of DME       PT Treatment Interventions DME instruction;Gait training;Functional mobility training;Therapeutic activities;Therapeutic exercise;Balance training;Neuromuscular re-education;Patient/family education     PT Goals (Current goals can be found in the Care Plan section)  Acute Rehab PT Goals Patient Stated Goal: to return home PT Goal Formulation: With patient/family Time For Goal Achievement: 06/21/21 Potential to Achieve Goals: Good    Frequency Min 3X/week   Barriers to discharge        Co-evaluation               AM-PAC PT "6 Clicks" Mobility  Outcome Measure Help needed turning from your back to your side while in a flat bed without using bedrails?: A Little Help needed moving from lying on your back to sitting on the side of a flat bed without using bedrails?: A Little Help needed moving to and from a bed to a chair (including a wheelchair)?: A Little Help needed standing up from a chair using your arms (e.g., wheelchair or bedside chair)?: A Little Help needed to walk in hospital room?: A Little Help needed climbing 3-5 steps with a railing? : A Lot 6 Click Score: 17    End of Session   Activity Tolerance: Patient tolerated treatment well Patient left: in bed;with call bell/phone within reach;with family/visitor present Nurse Communication: Mobility status PT Visit Diagnosis: Other abnormalities of gait and mobility (R26.89);Muscle weakness (generalized) (M62.81);History of falling (Z91.81)    Time: [id number] PT Time Calculation (min) (ACUTE ONLY): 34 min   Charges:   PT Evaluation $PT Eval Low Complexity: [place], PT, DPT Acute Rehabilitation Pager: [phone] Office [phone]   Lillia Carmel  Truman Hayward 06/07/2021, 5:12 PM

## 2021-06-07 NOTE — Assessment & Plan Note (Signed)
Improved; continue to monitor

## 2021-06-07 NOTE — Assessment & Plan Note (Signed)
Emailed Urology Dr. Berneice Heinrich that pt ahs been admittted

## 2021-06-07 NOTE — Procedures (Signed)
Interventional Radiology Procedure Note  Procedure: RT PCN EXCHG    Complications: None  Estimated Blood Loss:  MIN  Findings: 14 FR PCN     M. Ruel Favors, MD

## 2021-06-08 ENCOUNTER — Other Ambulatory Visit (HOSPITAL_COMMUNITY): Payer: Self-pay

## 2021-06-08 LAB — BASIC METABOLIC PANEL
Anion gap: 9 (ref 5–15)
BUN: 16 mg/dL (ref 8–23)
CO2: 26 mmol/L (ref 22–32)
Calcium: 8.8 mg/dL — ABNORMAL LOW (ref 8.9–10.3)
Chloride: 101 mmol/L (ref 98–111)
Creatinine, Ser: 1.38 mg/dL — ABNORMAL HIGH (ref 0.61–1.24)
GFR, Estimated: 54 mL/min — ABNORMAL LOW (ref >60)
Glucose, Bld: 100 mg/dL — ABNORMAL HIGH (ref 70–99)
Potassium: 3.3 mmol/L — ABNORMAL LOW (ref 3.5–5.1)
Sodium: 136 mmol/L (ref 135–145)

## 2021-06-08 LAB — CBC
HCT: 38.4 % — ABNORMAL LOW (ref 39.0–52.0)
Hemoglobin: 12.5 g/dL — ABNORMAL LOW (ref 13.0–17.0)
MCH: 28.5 pg (ref 26.0–34.0)
MCHC: 32.6 g/dL (ref 30.0–36.0)
MCV: 87.7 fL (ref 80.0–100.0)
Platelets: 168 10*3/uL (ref 150–400)
RBC: 4.38 MIL/uL (ref 4.22–5.81)
RDW: 15.9 % — ABNORMAL HIGH (ref 11.5–15.5)
WBC: 5 10*3/uL (ref 4.0–10.5)
nRBC: 0 % (ref 0.0–0.2)

## 2021-06-08 LAB — FERRITIN: Ferritin: 121 ng/mL (ref 24–336)

## 2021-06-08 LAB — D-DIMER, QUANTITATIVE: D-Dimer, Quant: 1.31 ug/mL-FEU — ABNORMAL HIGH (ref 0.00–0.50)

## 2021-06-08 LAB — C-REACTIVE PROTEIN: CRP: 3.8 mg/dL — ABNORMAL HIGH (ref <1.0)

## 2021-06-08 MED ORDER — POTASSIUM CHLORIDE CRYS ER 20 MEQ PO TBCR
40.0000 meq | EXTENDED_RELEASE_TABLET | ORAL | Status: AC
  Administered 2021-06-08 (×2): 40 meq via ORAL
  Filled 2021-06-08 (×2): qty 2

## 2021-06-08 MED ORDER — AMLODIPINE BESYLATE 5 MG PO TABS: 5.0000 mg | ORAL_TABLET | Freq: Two times a day (BID) | ORAL | Status: DC

## 2021-06-08 MED ORDER — SENNA 8.6 MG PO TABS
2.0000 | ORAL_TABLET | Freq: Every day | ORAL | Status: DC
  Administered 2021-06-08: 17.2 mg via ORAL
  Filled 2021-06-08: qty 2

## 2021-06-08 MED ORDER — POLYETHYLENE GLYCOL 3350 17 G PO PACK
17.0000 g | PACK | Freq: Every day | ORAL | Status: DC
  Administered 2021-06-08: 17 g via ORAL
  Filled 2021-06-08: qty 1

## 2021-06-08 MED ORDER — SENNA 8.6 MG PO TABS
1.0000 | ORAL_TABLET | Freq: Every evening | ORAL | 0 refills | Status: DC | PRN
  Filled 2021-06-08: qty 30, 30d supply, fill #0

## 2021-06-08 MED ORDER — POLYETHYLENE GLYCOL 3350 17 GM/SCOOP PO POWD
17.0000 g | Freq: Every day | ORAL | 0 refills | Status: DC
  Filled 2021-06-08: qty 238, 14d supply, fill #0

## 2021-06-08 MED ORDER — CEFDINIR 300 MG PO CAPS
300.0000 mg | ORAL_CAPSULE | Freq: Two times a day (BID) | ORAL | 0 refills | Status: AC
  Filled 2021-06-08: qty 10, 5d supply, fill #0

## 2021-06-08 NOTE — Progress Notes (Signed)
Physical Therapy Treatment Patient Details Name: Nicholas Mckenzie MRN: [id number] DOB: [birth year] Today's Date: 06/08/2021   History of Present Illness 75 y.o. male presents to [facility] hospital on 06/06/2021 after fall with head injury at home. PT found to have SDH as well as UTI and incidental COVID+. Pt underwent R PCN exchange on 06/07/2021. PMH includes CKD stage IIIa, HTN, OSA, depression/anxiety, CVA with residual left hemiparesis.    PT Comments    The pt continues to make slow but steady progress with OOB mobility and power to complete sit-stand transfers this session. He was able to complete repeated sit-stand transfers with minG, but needs increased time and effort to complete as well as BUE support. The pt and his spouse state he is close to his baseline mobility following prior CVA, but that he is currently showing improved ability to complete transfers compared to mobility PTA. Given the pt's deficits, continue to recommend HHPT to progress endurance, strength, and stability to reduce risk of falls following d/c.    Recommendations for follow up therapy are one component of a multi-disciplinary discharge planning process, led by the attending physician.  Recommendations may be updated based on patient status, additional functional criteria and insurance authorization.  Follow Up Recommendations  Home health PT     Assistance Recommended at Discharge Intermittent Supervision/Assistance  Equipment Recommendations  None recommended by PT (pt owns needed DME)    Recommendations for Other Services       Precautions / Restrictions Precautions Precautions: Fall Precaution Comments: residual R weakness Restrictions Weight Bearing Restrictions: No     Mobility  Bed Mobility Overal bed mobility: Needs Assistance Bed Mobility: Rolling;Sidelying to Sit Rolling: Supervision Sidelying to sit: Min assist       General bed mobility comments: verbal cues for technique and sequencing, minA  to complete trunk elevation    Transfers Overall transfer level: Needs assistance Equipment used: Rolling walker (2 wheels) Transfers: Sit to/from Stand Sit to Stand: Min guard           General transfer comment: minG with significant time to power up to standing and control lower to chair    Ambulation/Gait Ambulation/Gait assistance: Min guard Gait Distance (Feet): 30 Feet Assistive device: Rolling walker (2 wheels) Gait Pattern/deviations: Step-to pattern;Decreased step length - right Gait velocity: reduced Gait velocity interpretation: <1.31 ft/sec, indicative of household ambulator Pre-gait activities: standing marches General Gait Details: pt taking small steps with RLE behind LLE. short strides with cues to maintain LE within RW. spouse reports this is close to his baseline. max cues for directions      Modified Rankin (Stroke Patients Only) Modified Rankin (Stroke Patients Only) Pre-Morbid Rankin Score: Moderate disability Modified Rankin: Moderately severe disability     Balance Overall balance assessment: Needs assistance Sitting-balance support: No upper extremity supported;Feet supported Sitting balance-Leahy Scale: Good     Standing balance support: Bilateral upper extremity supported;Reliant on assistive device for balance Standing balance-Leahy Scale: Poor Standing balance comment: dependent on BUE support with max cues for directions and repositioning in RW.                            Cognition Arousal/Alertness: Awake/alert Behavior During Therapy: WFL for tasks assessed/performed Overall Cognitive Status: Impaired/Different from baseline Area of Impairment: Problem solving;Safety/judgement                         Safety/Judgement: Decreased awareness of safety;Decreased  awareness of deficits   Problem Solving: Slow processing General Comments: pt able to voice needs, needs increased cues and assist to manage sequence tasks  or perform problem solving        Exercises Other Exercises Other Exercises: repeated sit-stand from EOB    General Comments General comments (skin integrity, edema, etc.): VSS on RA      Pertinent Vitals/Pain Pain Assessment: No/denies pain Pain Intervention(s): Monitored during session     PT Goals (current goals can now be found in the care plan section) Acute Rehab PT Goals Patient Stated Goal: return home PT Goal Formulation: With patient/family Time For Goal Achievement: 06/21/21 Potential to Achieve Goals: Good Progress towards PT goals: Progressing toward goals    Frequency    Min 3X/week      PT Plan Current plan remains appropriate       AM-PAC PT "6 Clicks" Mobility   Outcome Measure  Help needed turning from your back to your side while in a flat bed without using bedrails?: A Little Help needed moving from lying on your back to sitting on the side of a flat bed without using bedrails?: A Little Help needed moving to and from a bed to a chair (including a wheelchair)?: A Little Help needed standing up from a chair using your arms (e.g., wheelchair or bedside chair)?: A Little Help needed to walk in hospital room?: A Little Help needed climbing 3-5 steps with a railing? : A Lot 6 Click Score: 17    End of Session Equipment Utilized During Treatment: Gait belt Activity Tolerance: Patient tolerated treatment well Patient left: in bed;with call bell/phone within reach;with family/visitor present Nurse Communication: Mobility status PT Visit Diagnosis: Other abnormalities of gait and mobility (R26.89);Muscle weakness (generalized) (M62.81);History of falling (Z91.81)     Time: 9480-1655 PT Time Calculation (min) (ACUTE ONLY): 34 min  Charges:  $Gait Training: 8-22 mins $Therapeutic Activity: 8-22 mins                     Vickki Muff, PT, DPT   Acute Rehabilitation Department Pager #: [phone]   Ronnie Derby 06/08/2021, 1:39 PM

## 2021-06-08 NOTE — TOC Transition Note (Signed)
Transition of Care [facility]) - CM/SW Discharge Note   Patient Details  Name: Nicholas Mckenzie MRN: [id number] Date of Birth: [birth year]  [facility] CM/SW Contact:  Kermit Balo, RN Phone Number: 06/08/2021, 1:02 PM   Clinical Narrative:    Patietnt discharging home today with resumption of home health services through [facility]. Information on the AVS.  Pt has all needed DME at home.  Pt has transportation home.    Final next level of care: Home w Home Health Services Barriers to Discharge: No Barriers Identified   Patient Goals and CMS Choice   CMS Medicare.gov Compare Post Acute Care list provided to:: Patient Represenative (must comment) Choice offered to / list presented to : Spouse  Discharge Placement                       Discharge Plan and Services                          HH Arranged: PT, RN, OT, Speech Therapy HH Agency: [facility] Date [facility] Agency Contacted: 06/08/21   Representative spoke with at [facility] Agency: Pearson Grippe  Social Determinants of Health (SDOH) Interventions     Readmission Risk Interventions No flowsheet data found.

## 2021-06-08 NOTE — Progress Notes (Signed)
Pt wheeled off unit by this RN. All item retrieved from the room and brought down with pt.

## 2021-06-08 NOTE — Discharge Summary (Addendum)
Physician Discharge Summary  Nicholas Mckenzie [id number] DOB: 11/19/1945  PCP: [name], MD  Admitted from: Home Discharged to: Home  Admit date: 06/06/2021 Discharge date: 06/08/2021  Recommendations for Outpatient Follow-up:    Follow-up Information     Pahwani, Michell Heinrich, MD. Schedule an appointment as soon as possible for a visit in 1 week(s).   Specialty: Internal Medicine Why: To be seen with repeat labs (CBC & BMP). Contact information: [address] [place] [address] [phone]         Alexis Frock, MD. Schedule an appointment as soon as possible for a visit.   Specialty: Urology Why: Please call for an early follow-up appointment. Contact information: [place] [address] [phone]         Frann Rider, NP Follow up on 06/13/2021.   Specialty: Neurology Why: 9:15am.  Please keep this prior appointment Contact information: [address] [place] [address] [phone]         [facility] Follow up.   Why: The home health agency will call you for the next home visit. Contact information: [place]:  [place] Orders (From admission, onward)     Start     Ordered   06/08/21 [place]  At discharge       Question Answer Comment  To provide the following care/treatments PT   To provide the following care/treatments OT      06/08/21 1258   06/08/21 [place]  At discharge       Question Answer Comment  To provide the following care/treatments PT   To provide the following care/treatments OT      06/08/21 1046             Equipment/Devices: None    Discharge Condition: Improved and stable.   Code Status: DNR Diet recommendation:  Discharge Diet Orders (From admission, onward)     Start     Ordered   06/08/21 0000  Diet - low sodium heart healthy        06/08/21 1258             Discharge Diagnoses:   Principal Problem:   Subdural hematoma Active Problems:   CKD (chronic kidney disease) stage 3a   Hypertension   Sleep apnea   Ureteral stent occlusion (HCC)   Nonfunctioning kidney   COVID-19 virus infection   Hypokalemia   H/O ischemic right PCA stroke   Frequent urination   Acute lower UTI   Ascending aortic aneurysm   Atrophy of right kidney   Anemia   Obesity (BMI 30.0-34.9)   Brief Summary: 75 year old married male, ambulates with the help of a walker, medical history significant for CKD stage IIIa, HTN, OSA, depression/anxiety, CVA with residual left hemiparesis, on aspirin and Plavix prior to admission, kidney stones with ureteral obstruction and right percutaneous nephrostomy tube with plans to exchange 06/07/2021 and eventual nephrectomy in January 2023, sustained fall x3 with head injury at home, presented with fever with concerns of UTI, foul-smelling urine from percutaneous nephrostomy and confusion. Admitted for subdural hemorrhage, incidental COVID and complicated UTI.  EDP discussed with Neurosurgery.  Neurology formally consulted.     Assessment & Plan:    Subdural hematoma: CT head 12/13: Acute trace subdural hemorrhage along the posterior falx.  EDP discussed with Neurosurgery on 12/13,  they reviewed the images and due to very small and essentially asymptomatic subdural, they did not recommend any repeat imaging unless the patient developed headache or new symptoms.  They recommended conservative management and treating other causes of encephalopathy.  Neurology consulted, recommended holding aspirin and Plavix, repeat head CT the next day.  CT head 12/14: No progression of trace subdural hematoma.  No new abnormality.  Neurology recommended holding all antiplatelets until outpatient follow-up with Neurology.  Patient has a prior follow-up appointment with neurology on 12/20 at which time decision can be made regarding resumption of antiplatelets.  Therapies evaluated  and recommend home health PT and OT   Kidney stones with ureteral obstruction, s/p right percutaneous nephrostomy with complicated UTI: Follows with Dr. Tresa Moore, [facility].  Was to have a scheduled exchange of right percutaneous nephrostomy on 12/14 as an outpatient but was admitted.  IR consulted and performed right percutaneous nephrostomy exchange on 12/14.  Treated empirically with IV cefepime for possible complicated UTI although apart from low-grade fever, patient had no dysuria or leukocytosis.  He did have foul-smelling urine from percutaneous nephrostomy.  Catheterized urine culture sample shows <10K colonies per mL i.e. insignificant growth and clean-catch urine culture shows multiple species, suggestive of contamination.  Patient has completed 2 days of IV cefepime.  No further fevers noted.  Discussed in detail with pharmacist, and opted to discharge on cefdinir to complete total 7 days course.  Although culture from PCN 10/14-rare Enterobacter cloacae, this did not reproduce on current culture data.  Blood cultures x2: Negative to date.  Outpatient follow-up with Dr. Tresa Moore, Urology.     Acute metabolic encephalopathy: Likely related to febrile illness/UTI complicating mild subdural and may be some underlying cognitive impairment.  Resolved.  As per spouse at bedside today, she did not really think that he had any confusion to begin with   Incidental COVID-19 virus infection: Patient has been vaccinated and boosted.  No respiratory symptoms and not hypoxic.  Trend inflammatory markers, mildly elevated and unremarkable.  Not a candidate for Paxlovid with due to history of Plavix use as discussed by admitting MD with pharmacy.  Airborne and contact isolation.  Chest x-ray without acute findings.  Completed 2 days of IV remdesivir but none at discharge.  Patient and spouse both were counseled that he should isolate/quarantine for a total of 10 days.  They verbalized understanding   Mild anemia:  Stable.  Hypertension: Mildly uncontrolled at times.  Continue amlodipine which patient takes 5 mg twice a day and adjust dose as needed  OSA: As per spouse, patient CPAP is broken.  CPAP at bedtime while here.  Outpatient follow-up with with PCP for new CPAP.  Nonfunctioning atrophic and obstructed right kidney: As per report, patient supposed to have nephrectomy in January 2023.  Forwarded hospitalization documents to Dr. Tresa Moore.   Hypokalemia: Replaced prior to discharge.  Magnesium 2.1.  Follow BMP as outpatient.   History of ischemic right PCA stroke with residual left hemiparesis: Was supposed to be on aspirin 81 Mg + Plavix 75 Mg x3 months followed by aspirin alone.  As communicated with Neurology 12/14, hold antiplatelets until close outpatient follow-up with neurology.  Patient has an upcoming follow-up with neurology on 12/20.   Stage IIIa chronic kidney disease: Baseline creatinine likely fluctuates in the 1.2-1.4 range.  Currently at baseline.  Ascending aortic aneurysm: Outpatient follow-up.  Body mass index is 30.41 kg/m./Obesity         Consultants:   Neurology  Interventional radiology   Procedures:   Right percutaneous nephrostomy exchange by IR on 12/14   Discharge Instructions  Discharge Instructions     Call MD for:   Complete by: As directed    Recurrent confusion or altered mental status.   Call MD for:  difficulty breathing, headache or visual disturbances   Complete by: As directed    Call MD for:  extreme fatigue   Complete by: As directed    Call MD for:  persistant dizziness or light-headedness   Complete by: As directed    Call MD for:  persistant nausea and vomiting   Complete by: As directed    Call MD for:  severe uncontrolled pain   Complete by: As directed    Call MD for:  temperature >100.4   Complete by: As directed    Diet - low sodium heart healthy   Complete by: As directed    Increase activity slowly   Complete by: As directed          Medication List     STOP taking these medications    aspirin 81 MG chewable tablet   clopidogrel 75 MG tablet Commonly known as: PLAVIX       TAKE these medications    amLODipine 5 MG tablet Commonly known as: NORVASC Take 1 tablet (5 mg total) by mouth in the morning and at bedtime.   cefdinir 300 MG capsule Commonly known as: OMNICEF Take 1 capsule (300 mg total) by mouth 2 (two) times daily for 5 days.   FLUoxetine 40 MG capsule Commonly known as: PROZAC Take 40 mg by mouth daily.   lactobacillus Pack Take 1 packet (1 g total) by mouth 2 (two) times daily.   melatonin 3 MG Tabs tablet Take 3 mg by mouth at bedtime.   multivitamin with minerals tablet Take 1 tablet by mouth daily.   polyethylene glycol powder 17 GM/SCOOP powder Commonly known as: GLYCOLAX/MIRALAX Dissolve 1 capful (17 g) in water and drink daily.   senna 8.6 MG Tabs tablet Commonly known as: SENOKOT Take 1 tablet (8.6 mg total) by mouth at bedtime as needed for mild constipation or moderate constipation.       Allergies  Allergen Reactions   Statins Other (See Comments)    Pt reports severe weakness and mobility issues with his med, as well as diarrhea      Procedures/Studies: DG Chest 2 View  Result Date: 06/06/2021 CLINICAL DATA:  Fever and possible urinary tract infection. Suspected sepsis. EXAM: CHEST - 2 VIEW COMPARISON:  Limited correlation made with neck CT 02/02/2021 and abdominal CT 04/01/2021. FINDINGS: The heart size and mediastinal contours are stable with vascular tortuosity accounting for superior mediastinal widening, similar to prior CTA. The lungs appear clear. There is no pleural effusion or pneumothorax. Diffuse syndesmophytes are noted throughout the thoracic spine. No acute osseous findings are evident. Telemetry leads overlie the chest. IMPRESSION: No evidence of active cardiopulmonary process. 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  Electronically Signed   By: Richardean Sale M.D.   On: 06/06/2021 15:31   CT HEAD WO CONTRAST (5MM)  Result Date: 06/07/2021 CLINICAL DATA:  Subdural hematoma EXAM: CT HEAD WITHOUT CONTRAST TECHNIQUE: Contiguous axial images were obtained from the base of the skull through the vertex without intravenous contrast. COMPARISON:  Head CT from yesterday FINDINGS: Brain: Trace subdural hematoma along the posterior falx without progression. Chronic right occipital and small-vessel subcortical infarcts. Confluent ischemic gliosis and brain atrophy. Vascular: No hyperdense  vessel or unexpected calcification. Skull: Normal. Negative for fracture or focal lesion. Sinuses/Orbits: No acute finding IMPRESSION: 1. No progression of trace subdural hematoma.  No new abnormality. 2. Advanced chronic ischemic injury. Electronically Signed   By: Jorje Guild M.D.   On: 06/07/2021 10:06   CT Head Wo Contrast  Result Date: 06/06/2021 CLINICAL DATA:  Head trauma, moderate-severe multiple falls with now some AMS EXAM: CT HEAD WITHOUT CONTRAST TECHNIQUE: Contiguous axial images were obtained from the base of the skull through the vertex without intravenous contrast. COMPARISON:  CT head April 02, 2021.  MRI April 05, 2021. FINDINGS: Brain: New trace hyperdensity along the posterior falx (for example series 4, image 84; series 3, image 19), compatible with acute subdural hemorrhage. No evidence of acute large vascular territory infarct. Prior infarcts in the right PCA territory, left middle cerebellar peduncle, and inferior left cerebellum. Additional moderate to severe patchy white matter hypoattenuation, nonspecific but compatible with chronic microvascular ischemic disease. No hydrocephalus, mass lesion, or abnormal mass effect. Vascular: No hyperdense vessel identified. Calcific intracranial atherosclerosis. Skull: No acute fracture. Sinuses/Orbits: Mild nasal sinus mucosal thickening with evidence of prior Skopic sinus  surgery. Unremarkable orbits. No mastoid effusions. Other: No mastoid effusions. IMPRESSION: 1. Acute trace subdural hemorrhage along the posterior falx. 2. 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 Findings discussed Dr. Sherry Ruffing via telephone at 4:20 PM. Electronically Signed   By: Margaretha Sheffield M.D.   On: 06/06/2021 16:26   IR PATIENT EVAL TECH 0-60 MINS  Result Date: 06/02/2021 Kathrin Penner     06/02/2021 12:12 PM Mr Starn here in Interventional Radiology to have leaking drainage bag replaced.  Appt time given to pt and his wife for nephrostomy tube replacement.  Relayed care information to Dr Mir    IR NEPHROSTOMY EXCHANGE RIGHT  Result Date: 06/07/2021 INDICATION: Chronic indwelling nephrostomy EXAM: FLUOROSCOPIC RIGHT NEPHROSTOMY EXCHANGE COMPARISON:  04/07/2021 MEDICATIONS: 1% LIDOCAINE LOCAL ANESTHESIA/SEDATION: Total intra-service moderate Sedation Time: NONE. The patient's level of consciousness and vital signs were monitored continuously by radiology nursing throughout the procedure under my direct supervision. CONTRAST:  15 cc-administered into the collecting system(s) FLUOROSCOPY TIME:  Fluoroscopy Time: 0 minutes 24 seconds (2.6 mGy). COMPLICATIONS: None immediate. PROCEDURE: Informed written consent was obtained from the patient after a thorough discussion of the procedural risks, benefits and alternatives. All questions were addressed. Maximal Sterile Barrier Technique was utilized including caps, mask, sterile gowns, sterile gloves, sterile drape, hand hygiene and skin antiseptic. A timeout was performed prior to the initiation of the procedure. Under sterile conditions and local anesthesia, the 14 French right nephrostomy catheter was exchanged successfully over an Amplatz guidewire under fluoroscopy. Retention loop formed the renal pelvis. Contrast injection confirms position. Catheter secured with 0 silk suture. Sterile dressing  applied. Gravity drainage bag connected. IMPRESSION: Successful 14 French right nephrostomy exchange. Electronically Signed   By: Jerilynn Mages.  Shick M.D.   On: 06/07/2021 16:00      Subjective: Patient interviewed and examined along with spouse at bedside.  Denies complaints.  No fever.  Had mild transient headache yesterday but has since resolved.  No visual symptoms or new strokelike symptoms.  No chest pain or dyspnea.  RN reported constipation.  Discharge Exam:  Vitals:   06/07/21 2349 06/08/21 0903 06/08/21 1200 06/08/21 1253  BP: (!) 165/90 (!) 153/93  (!) 153/73  Pulse: 79 73    Resp: 13 16    Temp: 98.7 F (37.1 C) 98 F (  36.7 C) 98 F (36.7 C) 98.3 F (36.8 C)  TempSrc: Oral Oral Oral Oral  SpO2: 90% 94%  98%  Weight:      Height:        General exam: Elderly male, moderately built and obese lying comfortably propped up in bed without distress.  Oral mucosa moist.  Sitting up and eating breakfast. Respiratory system: Clear to auscultation. Respiratory effort normal. Cardiovascular system: S1 & S2 heard, RRR. No JVD, murmurs, rubs, gallops or clicks. No pedal edema.  Telemetry personally reviewed: Sinus rhythm. Gastrointestinal system: Abdomen is nondistended, soft and nontender. No organomegaly or masses felt. Normal bowel sounds heard. GU: Right percutaneous nephrostomy site dressing clean and dry.  Urine in bag is clear and straw-colored, not cloudy and no gross hematuria. Central nervous system: Alert and oriented. No focal neurological deficits. Extremities: Right limbs grade 5 x 5 power.  Left limbs 4+ by 5 power Skin: No rashes, lesions or ulcers Psychiatry: Judgement and insight appear somewhat impaired. Mood & affect seem flat.     The results of significant diagnostics from this hospitalization (including imaging, microbiology, ancillary and laboratory) are listed below for reference.     Microbiology: Recent Results (from the past 240 hour(s))  Culture, blood  (Routine x 2)     Status: None (Preliminary result)   Collection Time: 06/06/21  2:50 PM   Specimen: BLOOD LEFT ARM  Result Value [place] Status   Specimen Description   Final    BLOOD LEFT ARM Performed at [facility], [address], [place], [place] [address]    Special Requests   Final    BOTTLES DRAWN AEROBIC AND ANAEROBIC Blood Culture adequate volume Performed at [facility], [address] [place], [place] [address]    Culture   Final    NO GROWTH 2 DAYS Performed at [facility], [address], [place], [place] [address]    Report Status PENDING  Incomplete  Resp Panel by RT-PCR (Flu A&B, Covid) Nasopharyngeal Swab     Status: Abnormal   Collection Time: 06/06/21  2:58 PM   Specimen: Nasopharyngeal Swab; Nasopharyngeal(NP) swabs in vial transport medium  Result Value Ref [place] Status   SARS Coronavirus 2 by RT PCR POSITIVE (A) NEGATIVE Final    Comment: (NOTE) SARS-CoV-2 target nucleic acids are DETECTED.  The SARS-CoV-2 RNA is generally detectable in upper respiratory specimens during the acute phase of infection. Positive results are indicative of the presence of the identified virus, but do not rule out bacterial infection or co-infection with other pathogens not detected by the test. Clinical correlation with patient history and other diagnostic information is necessary to determine patient infection status. The expected result is Negative.  Fact Sheet for Patients: BloggerCourse.com  Fact Sheet for Healthcare Providers: SeriousBroker.it  This test is not yet approved or cleared by the [place] FDA and  has been authorized for detection and/or diagnosis of SARS-CoV-2 by FDA under an Emergency Use Authorization (EUA).  This EUA will remain in effect (meaning this test can be used) for the duration of  the COVID-19 declaration under Section 564(b)(1) of the A  ct, 21 U.S.C. section 360bbb-3(b)(1), unless the authorization is terminated or revoked sooner.     Influenza A by PCR NEGATIVE NEGATIVE Final   Influenza B by PCR NEGATIVE NEGATIVE Final    Comment: (NOTE) The Xpert Xpress SARS-CoV-2/FLU/RSV plus assay is intended as an aid in the diagnosis of influenza from Nasopharyngeal swab  specimens and should not be used as a sole basis for treatment. Nasal washings and aspirates are unacceptable for Xpert Xpress SARS-CoV-2/FLU/RSV testing.  Fact Sheet for Patients: EntrepreneurPulse.com.au  Fact Sheet for Healthcare Providers: IncredibleEmployment.be  This test is not yet approved or cleared by the [place] FDA and has been authorized for detection and/or diagnosis of SARS-CoV-2 by FDA under an Emergency Use Authorization (EUA). This EUA will remain in effect (meaning this test can be used) for the duration of the COVID-19 declaration under Section 564(b)(1) of the Act, 21 U.S.C. section 360bbb-3(b)(1), unless the authorization is terminated or revoked.  Performed at [facility], [address] [place], [place] [address]   Culture, blood (Routine x 2)     Status: None (Preliminary result)   Collection Time: 06/06/21  2:58 PM   Specimen: BLOOD RIGHT ARM  Result Value [place] Status   Specimen Description   Final    BLOOD RIGHT ARM Performed at [facility], [address] [place], [place] [address]    Special Requests   Final    BOTTLES DRAWN AEROBIC AND ANAEROBIC Blood Culture adequate volume Performed at [facility], [address], [place], [place] [address]    Culture   Final    NO GROWTH 2 DAYS Performed at [facility], [place] [address] [place], [place] [address]    Report Status PENDING  Incomplete  Urine Culture     Status: Abnormal   Collection Time: 06/06/21  3:30 PM   Specimen: Urine, Catheterized  Result  Value Ref [place] Status   Specimen Description   Final    URINE, CATHETERIZED Performed at [facility], [address], [place], [place] [address]    Special Requests   Final    NONE Performed at [facility], [address] [place], [place] [address]    Culture (A)  Final    <10,000 COLONIES/mL INSIGNIFICANT GROWTH Performed at [facility] [address] [place], [place] [address]    Report Status 06/07/2021 FINAL  Final  Urine Culture     Status: Abnormal   Collection Time: 06/06/21  3:45 PM   Specimen: Urine, Clean Catch  Result Value Ref [place] Status   Specimen Description   Final    URINE, CLEAN CATCH Performed at [facility], [address], [place], [place] [address]    Special Requests   Final    NONE Performed at [facility], [address], [place], [place] [address]    Culture MULTIPLE SPECIES PRESENT, SUGGEST RECOLLECTION (A)  Final   Report Status 06/07/2021 FINAL  Final     Labs: CBC: Recent Labs  Lab 06/06/21 1448 06/07/21 0408 06/08/21 0312  WBC 7.8 5.5 5.0  NEUTROABS 5.9 3.9  --   HGB 12.4* 12.5* 12.5*  HCT 39.3 38.4* 38.4*  MCV 87.9 88.5 87.7  PLT 210 191 [id number]    Basic Metabolic Panel: Recent Labs  Lab 06/06/21 1448 06/06/21 2328 06/07/21 0408 06/08/21 0312  NA 141 140 139 136  K 3.3* 3.0* 2.8* 3.3*  CL 102 103 102 101  CO2 29 28 28 26   GLUCOSE 95 83 100* 100*  BUN 16 12 11 16   CREATININE 1.43* 1.27* 1.30* 1.38*  CALCIUM 9.7 8.9 8.8* 8.8*  MG  --  2.2 2.1  --   PHOS  --  3.6 3.5  --     Liver Function Tests: Recent Labs  Lab 06/06/21  1448 06/07/21 0408  AST 16 17  ALT 16 18  ALKPHOS 57 58  BILITOT 0.6 0.8  PROT 7.3 6.5  ALBUMIN 4.3 3.3*    Thyroid function studies Recent Labs    06/07/21 0408  TSH 0.915    Anemia work up Recent Labs    06/08/21 0312  FERRITIN 121    Urinalysis    Component Value Date/Time    COLORURINE YELLOW 06/06/2021 [place] 06/06/2021 1545   LABSPEC 1.017 06/06/2021 1545   PHURINE 6.5 06/06/2021 1545   GLUCOSEU NEGATIVE 06/06/2021 1545   [place] 06/06/2021 1545   [place] 06/06/2021 1545   [place] 06/06/2021 1545   PROTEINUR 30 (A) 06/06/2021 1545   NITRITE NEGATIVE 06/06/2021 1545   LEUKOCYTESUR MODERATE (A) 06/06/2021 1545    I discussed in detail with patient spouse at bedside, updated care and answered all questions  Time coordinating discharge: 35 minutes  SIGNED:  Vernell Leep, MD,  FACP, [facility], [facility] (Care Management Physician Certified). [facility]  To contact the attending provider between 7A-7P or the covering provider during after hours 7P-7A, please log into the web site [email or web address] and access using universal [place] password for that web site. If you do not have the password, please call the hospital operator.

## 2021-06-08 NOTE — Discharge Instructions (Signed)

## 2021-06-11 LAB — CULTURE, BLOOD (ROUTINE X 2)
Culture: NO GROWTH
Culture: NO GROWTH
Special Requests: ADEQUATE
Special Requests: ADEQUATE

## 2021-06-13 ENCOUNTER — Inpatient Hospital Stay: Payer: Medicare Other | Admitting: Adult Health

## 2021-06-21 ENCOUNTER — Telehealth: Payer: Self-pay | Admitting: Adult Health

## 2021-06-21 ENCOUNTER — Encounter: Payer: Self-pay | Admitting: Adult Health

## 2021-06-21 ENCOUNTER — Ambulatory Visit: Payer: Medicare Other | Admitting: Adult Health

## 2021-06-21 ENCOUNTER — Other Ambulatory Visit: Payer: Self-pay

## 2021-06-21 VITALS — BP 159/89 | HR 80 | Wt 203.0 lb

## 2021-06-21 DIAGNOSIS — S065XAA Traumatic subdural hemorrhage with loss of consciousness status unknown, initial encounter: Secondary | ICD-10-CM

## 2021-06-21 DIAGNOSIS — I69398 Other sequelae of cerebral infarction: Secondary | ICD-10-CM | POA: Diagnosis not present

## 2021-06-21 DIAGNOSIS — I63531 Cerebral infarction due to unspecified occlusion or stenosis of right posterior cerebral artery: Secondary | ICD-10-CM | POA: Diagnosis not present

## 2021-06-21 DIAGNOSIS — H539 Unspecified visual disturbance: Secondary | ICD-10-CM

## 2021-06-21 DIAGNOSIS — I69393 Ataxia following cerebral infarction: Secondary | ICD-10-CM

## 2021-06-21 NOTE — Patient Instructions (Signed)
Will follow up regarding use of aspirin and plavix and cholesterol management medication   We will repeat CT head imaging to look for resolution of recent bleed - you will be called to schedule  You will be called to schedule an evaluation with neuro-ophthalmologist Dr. Theone Murdoch - recommend scheduling no sooner then Feb/March  Continue working with therapies - once completed, we can discuss pursing additional outpatient therapy   Continue to follow up with PCP regarding cholesterol and blood pressure management  Maintain strict control of hypertension with blood pressure goal below 130/90 and cholesterol with LDL cholesterol (bad cholesterol) goal below 70 mg/dL.   Signs of a Stroke? Follow the BEFAST method:  Balance Watch for a sudden loss of balance, trouble with coordination or vertigo Eyes Is there a sudden loss of vision in one or both eyes? Or double vision?  Face: Ask the person to smile. Does one side of the face droop or is it numb?  Arms: Ask the person to raise both arms. Does one arm drift downward? Is there weakness or numbness of a leg? Speech: Ask the person to repeat a simple phrase. Does the speech sound slurred/strange? Is the person confused ? Time: If you observe any of these signs, call 911.     Followup in the future with me in 4 months or call earlier if needed       Thank you for coming to see [place] at [facility]. I hope we have been able to provide you high quality care today.  You may receive a patient satisfaction survey over the next few weeks. We would appreciate your feedback and comments so that we may continue to improve ourselves and the health of our patients.

## 2021-06-21 NOTE — Progress Notes (Signed)
[facility] [address] [place] [address] [phone]       HOSPITAL FOLLOW UP NOTE  [name] Date of Birth:  [birth year] Medical Record Number:  [id number]   Reason for Referral:  hospital stroke follow up    SUBJECTIVE:   CHIEF COMPLAINT:  Chief Complaint  Patient presents with   Follow-up    Rm 3 with spouse Nicholas Mckenzie  Pt is well, vision impairment, difficulty with hearing in L ear, L facial/tongue numbness, L sided weakness.  Spouse states he has a delay in response     HPI:   [name] is a 75 y.o. male with history of   CKD 3, OSA on CPAP, HTN, Kidney stones, OSA on CPAP who presented to the ED with malaise, lightheaded, confusion, dysuria, nausea and vomiting on 04/01/2021. Treated for a complicated UTI due to right ureteral stent obstruction. 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 This was not noted on CTH 2 days prior at the time of admission.  He was transferred to [facility] for further work-up and stroke evaluation.  Personally reviewed hospitalization pertinent progress notes, lab work and imaging.  Evaluated by Dr. Leonie Man. MRI showed multifocal acute ischemia within both cerebral hemispheres  R>L, left meddles cerebellar peduncle and inferior left cerebellum slightly older but still in the recent subacute phase.  Etiology likely secondary to large vessel disease with diffuse intracranial stenosis/occlusions (see below).  CTH showed evidence of chronic lacunar infarcts within the right basal ganglia, moderate chronic small vessel ischemic changes and mild generalized atrophy.  EF 60 to 65%.  LDL not calculated.  A1c 6.1.  Recommended DAPT for 3 months then aspirin alone. Therapy eval's rec'd CIR for generalized deconditioning and weakness. Due to insurance reasons, he was discharged to [facility] on 04/14/2021.  Readmitted 06/06/2021 after 3x falls at home hitting his  head. Found to have trace posterior falcine SDH.  Evaluated by neurologist Dr. Marcelle Overlie who recommended holding aspirin and Plavix until f/u with OP neurology.  Neurosurgery eval no intervention required as small in size and essentially asymptomatic. Repeat CTH no progression of SDH. Eval by nephrology for complicated UTI s/p nephrostomy exchange and abx course - planned f/u nephrology outpatient. Discharged home on 12/15.    Today, 06/21/2021, [name] is being seen for initial hospital stroke follow-up accompanied by his spouse.   Continued left-sided weakness - denies any improvement but her wife, has been gradually improving. Use of RW for gait difficulties - he questions transitioning to a cane.  Occasional mild slurred speech but gradually improving.  Some cognitive difficulties -gradually improving. C/o visual spatial deficit and left hemianopia - recently seen by optometrist who recommended evaluation by neuro-ophthalmologist for possible benefit of present lenses. 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  Recently seen by audiologist who made adjustments to hearing aides. C/o left lower face and tongue numbness. Working with [facility] PT/OT/SLP.  Denies new stroke/TIA symptoms  Aspirin/plavix d/c'd as noted above but per wife, recently seen urologist Dr. Barbee Cough at [facility] urology who advised to hold aspirin and Plavix until kidney surgery which will likely happen in March. Unable to verify this as unable to view notes via epic. Was not started on statin at prior d/c due to extensive history of statin intolerance.  Establish care with new PCP  Dr. Doristine Bosworth with initial visit 3 days ago - per wife, completed lab work - unable to view via epic. Will request labs sent to office.  Blood pressure today 159/89 - HH therapies routinely monitoring which has been stable. No additional EtOH use since discharge - prior, drinking multiple  beers and liquor drinks per day. Wife has been trying to encourage dietary changes and weight loss.         PERTINENT IMAGING  MR BRAIN 04/05/2021 IMPRESSION: Multifocal acute ischemia within both cerebral hemispheres, right worse than left. Ischemia in the left middle cerebellar peduncle and inferior left cerebellum may be slightly older but is still in the recent subacute phase. No hemorrhage or mass effect.  CTA HEAD/NECK 04/04/2021 IMPRESSION: CT head:   1. Acute/early subacute appearing right PCA territory cortical/subcortical infarct within the right parietooccipital lobes and callosal splenium. No significant mass effect at this time. No evidence of hemorrhagic conversion. 2. 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 3. Background moderate chronic small vessel ischemic changes within the cerebral white matter. 4. Mild generalized parenchymal atrophy.   CTA neck:   1. The non-dominant right vertebral artery becomes occluded at the C1-skull base level. 2. The dominant left vertebral artery is patent throughout the neck without stenosis. 3. Common and internal carotid arteries patent within the neck bilaterally without stenosis. 4. 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  CT HEAD 04/02/2021 IMPRESSION: 1. No acute intracranial abnormality. 2. Generalized atrophy and chronic microvascular ischemia.  2D ECHO 04/05/2021 IMPRESSIONS   1. Consider further imaging of the aorta with CTA given enlargement.   2. Left ventricular ejection fraction, by estimation, is 60 to 65%. The  left ventricle has normal function. The left ventricle has no regional  wall motion abnormalities. There is mild left ventricular hypertrophy.  Left ventricular diastolic parameters  were normal.   3. Right ventricular systolic function is normal. The right ventricular  size is normal. There is normal pulmonary artery systolic pressure.   4. Left  atrial size was moderately dilated.   5. The mitral valve is normal in structure. No evidence of mitral valve  regurgitation. No evidence of mitral stenosis.   6. The aortic valve is tricuspid. Aortic valve regurgitation is not  visualized. No aortic stenosis is present.   7. Aortic dilatation noted. There is mild dilatation of the aortic root,  measuring 39 mm. 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   8. The inferior vena cava is normal in size with greater than 50%  respiratory variability, suggesting right atrial pressure of 3 mmHg.    CT HEAD  06/06/2021 IMPRESSION: 1. Acute trace subdural hemorrhage along the posterior falx. 2. 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  06/07/2021 IMPRESSION: 1. No progression of trace subdural hematoma.  No new abnormality. 2. Advanced chronic ischemic injury.        ROS:   14 system review of systems performed and negative with exception of those listed in HPI  PMH:  Past Medical History:  Diagnosis Date   CKD (chronic kidney disease) stage 3, GFR 30-59 ml/min (HCC)    Hypertension    Kidney stones    Sleep apnea     PSH:  Past Surgical History:  Procedure Laterality Date   CATARACT EXTRACTION     IR NEPHROSTOMY EXCHANGE RIGHT  06/07/2021   IR NEPHROSTOMY PLACEMENT RIGHT  04/07/2021   IR PATIENT EVAL TECH 0-60 MINS  06/02/2021   kidney stent     MANDIBLE FRACTURE SURGERY      Social History:  Social History   Socioeconomic History   Marital status: Married    Spouse name: Not on file   Number of children: Not on file   Years of education: Not on file   Highest education level: Not on file  Occupational History   Not on file  Tobacco Use   Smoking status: Never   Smokeless tobacco: Never  Substance and Sexual Activity   Alcohol use: Yes    Comment: Socially; twice a week   Drug use: Not on file   Sexual activity: Not on file  Other  Topics Concern   Not on file  Social History Narrative   Not on file   Social Determinants of Health   Financial Resource Strain: Not on file  Food Insecurity: Not on file  Transportation Needs: Not on file  Physical Activity: Not on file  Stress: Not on file  Social Connections: Not on file  Intimate Partner Violence: Not on file    Family History:  Family History  Problem Relation Age of Onset   Hypertension Mother    Hypertension Other     Medications:   Current Outpatient Medications on File Prior to Visit  Medication Sig Dispense Refill   amLODipine (NORVASC) 5 MG tablet Take 1 tablet (5 mg total) by mouth in the morning and at bedtime.     FLUoxetine (PROZAC) 40 MG capsule Take 40 mg by mouth daily.     lactobacillus (FLORANEX/LACTINEX) PACK Take 1 packet (1 g total) by mouth 2 (two) times daily.     melatonin 3 MG TABS tablet Take 3 mg by mouth at bedtime.     Multiple Vitamins-Minerals (MULTIVITAMIN WITH MINERALS) tablet Take 1 tablet by mouth daily.     No current facility-administered medications on file prior to visit.    Allergies:   Allergies  Allergen Reactions   Statins Other (See Comments)    Pt reports severe weakness and mobility issues with his med, as well as diarrhea      OBJECTIVE:  Physical Exam  Vitals:   06/21/21 1043  BP: (!) 159/89  Pulse: 80  Weight: 203 lb (92.1 kg)   Body mass index is 30.87 kg/m. No results found.  Post stroke PHQ 2/9 Depression screen PHQ 2/9 06/21/2021  Decreased Interest 0  Down, Depressed, Hopeless 0  PHQ - 2 Score 0     General: well developed, well nourished, very pleasant elderly Caucasian male, seated, in no evident distress Head: head normocephalic and atraumatic.   Neck: supple with no carotid or supraclavicular bruits Cardiovascular: regular rate and rhythm, no murmurs Musculoskeletal: no deformity Skin:  no rash/petichiae Vascular:  Normal pulses all extremities   Neurologic  Exam Mental Status: Awake and fully alert.  Mild dysarthria.  No evidence of aphasia.  Oriented to place and time. Recent memory subjectively impaired and remote memory intact. Attention span, concentration and fund of knowledge appropriate during visit. Mood and affect appropriate.  Cranial Nerves: Fundoscopic exam reveals sharp disc margins. Pupils equal, briskly reactive to light. Extraocular movements full without nystagmus although delayed/impaired OS movement with convergence. Visual fields left homonymous hemianopia. HOH L>R. Facial sensation intact.  Left lower facial weakness.  Tongue, palate moves normally and symmetrically.  Motor: Normal bulk and tone. Normal strength in all tested extremity muscles except mild left grip weakness and hand dexterity Sensory.: intact to touch ,  pinprick , position and vibratory sensation.  Coordination: Rapid alternating movements normal in all extremities except decreased left hand. Finger-to-nose and heel-to-shin left sided ataxia. Gait and Station: Arises from chair with mild difficulty. Stance is normal. Gait demonstrates ataxic gait with decreased stride length and step height of LLE and use of RW. tandem walk and heel toe not attempted.  Reflexes: 1+ and symmetric. Toes downgoing.     NIHSS  6 Modified Rankin  3-4      ASSESSMENT: Nicholas Mckenzie is a 76 y.o. 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 Vascular risk factors include diffuse intracranial stenosis (R P3 occlusion, severe stenosis P2/P3 junction, L V4 mild/mod stenosis, L M1 severe, near occlusive stenosis, R A1 moderate stenosis and arthrosclerotic plaque b/l ICAs), R VA occlusion, prior strokes on imaging, HTN, HLD, OSA on CPAP and pre-DM (A1c 6.1). Recent traumatic SDH post fall 06/06/2021     PLAN:  R PCA stroke :  Residual deficit: left sided ataxia, ataxic gait, visual disturbance with left hemianopia  and occasional diplopia, left facial numbness, dysarthria and cognitive complaints.  Continue working with [facility] therapies for hopeful ongoing improvement. May consider transition to outpatient therapies once completed.  Continued use of RW at all times unless otherwise instructed. Referral placed to neuro-ophthalmology for evaluation no sooner than Feb/March to allow for potential further recovery Will further discuss use of aspirin/plavix and statin/cholesterol medication with Dr. Leonie Man and Dr. Barbee Cough - concern of increased stroke risk with diffuse intracranial stenosis and being off these medications but need to weight risk vs benefit.  Discussed secondary stroke prevention measures and importance of close PCP follow up for aggressive stroke risk factor management. I have gone over the pathophysiology of stroke, warning signs and symptoms, risk factors and their management in some detail with instructions to go to the closest emergency room for symptoms of concern. HTN: BP goal <130/90.  Stable on current regimen per PCP HLD: LDL goal <70. Prior hx with statin use but reports significant intolerance to statins. LDL during admission unable to be calculated. Recently completed by PCP - will request results be sent to office Traumatic SDH: post fall.  Asymptomatic. Repeat CTH to assess for resolution    Follow up in 4 months or call earlier if needed   CC:  [facility] provider: Dr. Leonie Man PCP: [name], MD    I spent a prolonged 90 minutes of face-to-face and non-face-to-face time with patient and wife.  This included previsit chart review including review of recent hospitalizations, lab review, study review, order entry, electronic health record documentation, patient and wife education and prolonged discussion regarding recent stroke including etiology, secondary stroke prevention measures and indication for above medications and importance of managing stroke risk factors, residual deficits and typical  recovery time, recent traumatic SDH and indication for repeat imaging, and answered all other multiple questions to patient and wife's satisfaction  Frann Rider, AGNP-BC  [facility] [address] [place], [place] [address]  Phone [phone] Fax [phone] Note: This document was prepared with digital dictation and possible smart phrase technology. Any transcriptional errors that result from this process are unintentional.

## 2021-06-21 NOTE — Telephone Encounter (Signed)
[employer] medicare order sent to [facility], [facility] they will reach out to the patient to schedule.  

## 2021-06-26 NOTE — Progress Notes (Signed)
I agree with the above plan 

## 2021-06-27 ENCOUNTER — Telehealth: Payer: Self-pay | Admitting: Adult Health

## 2021-06-27 DIAGNOSIS — Z436 Encounter for attention to other artificial openings of urinary tract: Secondary | ICD-10-CM | POA: Diagnosis not present

## 2021-06-27 DIAGNOSIS — N1831 Chronic kidney disease, stage 3a: Secondary | ICD-10-CM | POA: Diagnosis not present

## 2021-06-27 DIAGNOSIS — I129 Hypertensive chronic kidney disease with stage 1 through stage 4 chronic kidney disease, or unspecified chronic kidney disease: Secondary | ICD-10-CM | POA: Diagnosis not present

## 2021-06-27 DIAGNOSIS — I69354 Hemiplegia and hemiparesis following cerebral infarction affecting left non-dominant side: Secondary | ICD-10-CM | POA: Diagnosis not present

## 2021-06-27 DIAGNOSIS — I639 Cerebral infarction, unspecified: Secondary | ICD-10-CM | POA: Diagnosis not present

## 2021-06-27 DIAGNOSIS — Z960 Presence of urogenital implants: Secondary | ICD-10-CM | POA: Diagnosis not present

## 2021-06-27 DIAGNOSIS — I69312 Visuospatial deficit and spatial neglect following cerebral infarction: Secondary | ICD-10-CM | POA: Diagnosis not present

## 2021-06-27 DIAGNOSIS — F32A Depression, unspecified: Secondary | ICD-10-CM | POA: Diagnosis not present

## 2021-06-27 DIAGNOSIS — Z9181 History of falling: Secondary | ICD-10-CM | POA: Diagnosis not present

## 2021-06-27 DIAGNOSIS — Z7982 Long term (current) use of aspirin: Secondary | ICD-10-CM | POA: Diagnosis not present

## 2021-06-27 DIAGNOSIS — Z87440 Personal history of urinary (tract) infections: Secondary | ICD-10-CM | POA: Diagnosis not present

## 2021-06-27 DIAGNOSIS — N200 Calculus of kidney: Secondary | ICD-10-CM | POA: Diagnosis not present

## 2021-06-27 DIAGNOSIS — Z7902 Long term (current) use of antithrombotics/antiplatelets: Secondary | ICD-10-CM | POA: Diagnosis not present

## 2021-06-27 DIAGNOSIS — G4733 Obstructive sleep apnea (adult) (pediatric): Secondary | ICD-10-CM | POA: Diagnosis not present

## 2021-06-27 NOTE — Telephone Encounter (Signed)
Referral to ophthalmology has been sent to [facility]/Dr. Timothy Martin. Phone: [phone]. °

## 2021-06-28 DIAGNOSIS — I639 Cerebral infarction, unspecified: Secondary | ICD-10-CM | POA: Diagnosis not present

## 2021-06-28 DIAGNOSIS — G4733 Obstructive sleep apnea (adult) (pediatric): Secondary | ICD-10-CM | POA: Diagnosis not present

## 2021-06-28 DIAGNOSIS — Z9181 History of falling: Secondary | ICD-10-CM | POA: Diagnosis not present

## 2021-06-28 DIAGNOSIS — N200 Calculus of kidney: Secondary | ICD-10-CM | POA: Diagnosis not present

## 2021-06-28 DIAGNOSIS — I129 Hypertensive chronic kidney disease with stage 1 through stage 4 chronic kidney disease, or unspecified chronic kidney disease: Secondary | ICD-10-CM | POA: Diagnosis not present

## 2021-06-28 DIAGNOSIS — Z7902 Long term (current) use of antithrombotics/antiplatelets: Secondary | ICD-10-CM | POA: Diagnosis not present

## 2021-06-28 DIAGNOSIS — N1831 Chronic kidney disease, stage 3a: Secondary | ICD-10-CM | POA: Diagnosis not present

## 2021-06-28 DIAGNOSIS — I69312 Visuospatial deficit and spatial neglect following cerebral infarction: Secondary | ICD-10-CM | POA: Diagnosis not present

## 2021-06-28 DIAGNOSIS — F32A Depression, unspecified: Secondary | ICD-10-CM | POA: Diagnosis not present

## 2021-06-28 DIAGNOSIS — Z87440 Personal history of urinary (tract) infections: Secondary | ICD-10-CM | POA: Diagnosis not present

## 2021-06-28 DIAGNOSIS — Z7982 Long term (current) use of aspirin: Secondary | ICD-10-CM | POA: Diagnosis not present

## 2021-06-28 DIAGNOSIS — I69354 Hemiplegia and hemiparesis following cerebral infarction affecting left non-dominant side: Secondary | ICD-10-CM | POA: Diagnosis not present

## 2021-06-28 DIAGNOSIS — Z960 Presence of urogenital implants: Secondary | ICD-10-CM | POA: Diagnosis not present

## 2021-06-28 DIAGNOSIS — Z436 Encounter for attention to other artificial openings of urinary tract: Secondary | ICD-10-CM | POA: Diagnosis not present

## 2021-06-29 DIAGNOSIS — G4733 Obstructive sleep apnea (adult) (pediatric): Secondary | ICD-10-CM | POA: Diagnosis not present

## 2021-06-29 DIAGNOSIS — I639 Cerebral infarction, unspecified: Secondary | ICD-10-CM | POA: Diagnosis not present

## 2021-06-29 DIAGNOSIS — N200 Calculus of kidney: Secondary | ICD-10-CM | POA: Diagnosis not present

## 2021-06-29 DIAGNOSIS — Z7902 Long term (current) use of antithrombotics/antiplatelets: Secondary | ICD-10-CM | POA: Diagnosis not present

## 2021-06-29 DIAGNOSIS — I69312 Visuospatial deficit and spatial neglect following cerebral infarction: Secondary | ICD-10-CM | POA: Diagnosis not present

## 2021-06-29 DIAGNOSIS — I129 Hypertensive chronic kidney disease with stage 1 through stage 4 chronic kidney disease, or unspecified chronic kidney disease: Secondary | ICD-10-CM | POA: Diagnosis not present

## 2021-06-29 DIAGNOSIS — Z436 Encounter for attention to other artificial openings of urinary tract: Secondary | ICD-10-CM | POA: Diagnosis not present

## 2021-06-29 DIAGNOSIS — Z9181 History of falling: Secondary | ICD-10-CM | POA: Diagnosis not present

## 2021-06-29 DIAGNOSIS — Z7982 Long term (current) use of aspirin: Secondary | ICD-10-CM | POA: Diagnosis not present

## 2021-06-29 DIAGNOSIS — Z960 Presence of urogenital implants: Secondary | ICD-10-CM | POA: Diagnosis not present

## 2021-06-29 DIAGNOSIS — F32A Depression, unspecified: Secondary | ICD-10-CM | POA: Diagnosis not present

## 2021-06-29 DIAGNOSIS — N1831 Chronic kidney disease, stage 3a: Secondary | ICD-10-CM | POA: Diagnosis not present

## 2021-06-29 DIAGNOSIS — Z87440 Personal history of urinary (tract) infections: Secondary | ICD-10-CM | POA: Diagnosis not present

## 2021-06-29 DIAGNOSIS — I69354 Hemiplegia and hemiparesis following cerebral infarction affecting left non-dominant side: Secondary | ICD-10-CM | POA: Diagnosis not present

## 2021-07-03 DIAGNOSIS — I7121 Aneurysm of the ascending aorta, without rupture: Secondary | ICD-10-CM | POA: Diagnosis not present

## 2021-07-03 DIAGNOSIS — D631 Anemia in chronic kidney disease: Secondary | ICD-10-CM | POA: Diagnosis not present

## 2021-07-03 DIAGNOSIS — Z7902 Long term (current) use of antithrombotics/antiplatelets: Secondary | ICD-10-CM | POA: Diagnosis not present

## 2021-07-03 DIAGNOSIS — Z8616 Personal history of COVID-19: Secondary | ICD-10-CM | POA: Diagnosis not present

## 2021-07-03 DIAGNOSIS — W19XXXD Unspecified fall, subsequent encounter: Secondary | ICD-10-CM | POA: Diagnosis not present

## 2021-07-03 DIAGNOSIS — I129 Hypertensive chronic kidney disease with stage 1 through stage 4 chronic kidney disease, or unspecified chronic kidney disease: Secondary | ICD-10-CM | POA: Diagnosis not present

## 2021-07-03 DIAGNOSIS — I69312 Visuospatial deficit and spatial neglect following cerebral infarction: Secondary | ICD-10-CM | POA: Diagnosis not present

## 2021-07-03 DIAGNOSIS — Z87440 Personal history of urinary (tract) infections: Secondary | ICD-10-CM | POA: Diagnosis not present

## 2021-07-03 DIAGNOSIS — F32A Depression, unspecified: Secondary | ICD-10-CM | POA: Diagnosis not present

## 2021-07-03 DIAGNOSIS — I69354 Hemiplegia and hemiparesis following cerebral infarction affecting left non-dominant side: Secondary | ICD-10-CM | POA: Diagnosis not present

## 2021-07-03 DIAGNOSIS — G4733 Obstructive sleep apnea (adult) (pediatric): Secondary | ICD-10-CM | POA: Diagnosis not present

## 2021-07-03 DIAGNOSIS — N261 Atrophy of kidney (terminal): Secondary | ICD-10-CM | POA: Diagnosis not present

## 2021-07-03 DIAGNOSIS — Z960 Presence of urogenital implants: Secondary | ICD-10-CM | POA: Diagnosis not present

## 2021-07-03 DIAGNOSIS — Z936 Other artificial openings of urinary tract status: Secondary | ICD-10-CM | POA: Diagnosis not present

## 2021-07-03 DIAGNOSIS — N132 Hydronephrosis with renal and ureteral calculous obstruction: Secondary | ICD-10-CM | POA: Diagnosis not present

## 2021-07-03 DIAGNOSIS — Z7982 Long term (current) use of aspirin: Secondary | ICD-10-CM | POA: Diagnosis not present

## 2021-07-03 DIAGNOSIS — Z9181 History of falling: Secondary | ICD-10-CM | POA: Diagnosis not present

## 2021-07-03 DIAGNOSIS — S065X0D Traumatic subdural hemorrhage without loss of consciousness, subsequent encounter: Secondary | ICD-10-CM | POA: Diagnosis not present

## 2021-07-03 DIAGNOSIS — N1831 Chronic kidney disease, stage 3a: Secondary | ICD-10-CM | POA: Diagnosis not present

## 2021-07-05 DIAGNOSIS — W19XXXD Unspecified fall, subsequent encounter: Secondary | ICD-10-CM | POA: Diagnosis not present

## 2021-07-05 DIAGNOSIS — G4733 Obstructive sleep apnea (adult) (pediatric): Secondary | ICD-10-CM | POA: Diagnosis not present

## 2021-07-05 DIAGNOSIS — Z8616 Personal history of COVID-19: Secondary | ICD-10-CM | POA: Diagnosis not present

## 2021-07-05 DIAGNOSIS — N261 Atrophy of kidney (terminal): Secondary | ICD-10-CM | POA: Diagnosis not present

## 2021-07-05 DIAGNOSIS — I69354 Hemiplegia and hemiparesis following cerebral infarction affecting left non-dominant side: Secondary | ICD-10-CM | POA: Diagnosis not present

## 2021-07-05 DIAGNOSIS — Z87440 Personal history of urinary (tract) infections: Secondary | ICD-10-CM | POA: Diagnosis not present

## 2021-07-05 DIAGNOSIS — Z9181 History of falling: Secondary | ICD-10-CM | POA: Diagnosis not present

## 2021-07-05 DIAGNOSIS — Z936 Other artificial openings of urinary tract status: Secondary | ICD-10-CM | POA: Diagnosis not present

## 2021-07-05 DIAGNOSIS — D631 Anemia in chronic kidney disease: Secondary | ICD-10-CM | POA: Diagnosis not present

## 2021-07-05 DIAGNOSIS — F32A Depression, unspecified: Secondary | ICD-10-CM | POA: Diagnosis not present

## 2021-07-05 DIAGNOSIS — Z7982 Long term (current) use of aspirin: Secondary | ICD-10-CM | POA: Diagnosis not present

## 2021-07-05 DIAGNOSIS — Z960 Presence of urogenital implants: Secondary | ICD-10-CM | POA: Diagnosis not present

## 2021-07-05 DIAGNOSIS — N1831 Chronic kidney disease, stage 3a: Secondary | ICD-10-CM | POA: Diagnosis not present

## 2021-07-05 DIAGNOSIS — S065X0D Traumatic subdural hemorrhage without loss of consciousness, subsequent encounter: Secondary | ICD-10-CM | POA: Diagnosis not present

## 2021-07-05 DIAGNOSIS — I69312 Visuospatial deficit and spatial neglect following cerebral infarction: Secondary | ICD-10-CM | POA: Diagnosis not present

## 2021-07-05 DIAGNOSIS — I7121 Aneurysm of the ascending aorta, without rupture: Secondary | ICD-10-CM | POA: Diagnosis not present

## 2021-07-05 DIAGNOSIS — N132 Hydronephrosis with renal and ureteral calculous obstruction: Secondary | ICD-10-CM | POA: Diagnosis not present

## 2021-07-05 DIAGNOSIS — I129 Hypertensive chronic kidney disease with stage 1 through stage 4 chronic kidney disease, or unspecified chronic kidney disease: Secondary | ICD-10-CM | POA: Diagnosis not present

## 2021-07-05 DIAGNOSIS — Z7902 Long term (current) use of antithrombotics/antiplatelets: Secondary | ICD-10-CM | POA: Diagnosis not present

## 2021-07-06 DIAGNOSIS — I7121 Aneurysm of the ascending aorta, without rupture: Secondary | ICD-10-CM | POA: Diagnosis not present

## 2021-07-06 DIAGNOSIS — N261 Atrophy of kidney (terminal): Secondary | ICD-10-CM | POA: Diagnosis not present

## 2021-07-06 DIAGNOSIS — Z9181 History of falling: Secondary | ICD-10-CM | POA: Diagnosis not present

## 2021-07-06 DIAGNOSIS — I69354 Hemiplegia and hemiparesis following cerebral infarction affecting left non-dominant side: Secondary | ICD-10-CM | POA: Diagnosis not present

## 2021-07-06 DIAGNOSIS — G4733 Obstructive sleep apnea (adult) (pediatric): Secondary | ICD-10-CM | POA: Diagnosis not present

## 2021-07-06 DIAGNOSIS — Z7902 Long term (current) use of antithrombotics/antiplatelets: Secondary | ICD-10-CM | POA: Diagnosis not present

## 2021-07-06 DIAGNOSIS — D631 Anemia in chronic kidney disease: Secondary | ICD-10-CM | POA: Diagnosis not present

## 2021-07-06 DIAGNOSIS — Z87440 Personal history of urinary (tract) infections: Secondary | ICD-10-CM | POA: Diagnosis not present

## 2021-07-06 DIAGNOSIS — S065X0D Traumatic subdural hemorrhage without loss of consciousness, subsequent encounter: Secondary | ICD-10-CM | POA: Diagnosis not present

## 2021-07-06 DIAGNOSIS — N1831 Chronic kidney disease, stage 3a: Secondary | ICD-10-CM | POA: Diagnosis not present

## 2021-07-06 DIAGNOSIS — N132 Hydronephrosis with renal and ureteral calculous obstruction: Secondary | ICD-10-CM | POA: Diagnosis not present

## 2021-07-06 DIAGNOSIS — Z8616 Personal history of COVID-19: Secondary | ICD-10-CM | POA: Diagnosis not present

## 2021-07-06 DIAGNOSIS — Z960 Presence of urogenital implants: Secondary | ICD-10-CM | POA: Diagnosis not present

## 2021-07-06 DIAGNOSIS — Z936 Other artificial openings of urinary tract status: Secondary | ICD-10-CM | POA: Diagnosis not present

## 2021-07-06 DIAGNOSIS — I69312 Visuospatial deficit and spatial neglect following cerebral infarction: Secondary | ICD-10-CM | POA: Diagnosis not present

## 2021-07-06 DIAGNOSIS — F32A Depression, unspecified: Secondary | ICD-10-CM | POA: Diagnosis not present

## 2021-07-06 DIAGNOSIS — I129 Hypertensive chronic kidney disease with stage 1 through stage 4 chronic kidney disease, or unspecified chronic kidney disease: Secondary | ICD-10-CM | POA: Diagnosis not present

## 2021-07-06 DIAGNOSIS — Z7982 Long term (current) use of aspirin: Secondary | ICD-10-CM | POA: Diagnosis not present

## 2021-07-06 DIAGNOSIS — W19XXXD Unspecified fall, subsequent encounter: Secondary | ICD-10-CM | POA: Diagnosis not present

## 2021-07-07 ENCOUNTER — Encounter (HOSPITAL_BASED_OUTPATIENT_CLINIC_OR_DEPARTMENT_OTHER): Payer: Self-pay

## 2021-07-07 DIAGNOSIS — I69312 Visuospatial deficit and spatial neglect following cerebral infarction: Secondary | ICD-10-CM | POA: Diagnosis not present

## 2021-07-07 DIAGNOSIS — I69354 Hemiplegia and hemiparesis following cerebral infarction affecting left non-dominant side: Secondary | ICD-10-CM | POA: Diagnosis not present

## 2021-07-07 DIAGNOSIS — Z87440 Personal history of urinary (tract) infections: Secondary | ICD-10-CM | POA: Diagnosis not present

## 2021-07-07 DIAGNOSIS — G4733 Obstructive sleep apnea (adult) (pediatric): Secondary | ICD-10-CM

## 2021-07-07 DIAGNOSIS — S065X0D Traumatic subdural hemorrhage without loss of consciousness, subsequent encounter: Secondary | ICD-10-CM | POA: Diagnosis not present

## 2021-07-07 DIAGNOSIS — Z8616 Personal history of COVID-19: Secondary | ICD-10-CM | POA: Diagnosis not present

## 2021-07-07 DIAGNOSIS — I129 Hypertensive chronic kidney disease with stage 1 through stage 4 chronic kidney disease, or unspecified chronic kidney disease: Secondary | ICD-10-CM | POA: Diagnosis not present

## 2021-07-07 DIAGNOSIS — Z960 Presence of urogenital implants: Secondary | ICD-10-CM | POA: Diagnosis not present

## 2021-07-07 DIAGNOSIS — F32A Depression, unspecified: Secondary | ICD-10-CM | POA: Diagnosis not present

## 2021-07-07 DIAGNOSIS — Z9181 History of falling: Secondary | ICD-10-CM | POA: Diagnosis not present

## 2021-07-07 DIAGNOSIS — Z7902 Long term (current) use of antithrombotics/antiplatelets: Secondary | ICD-10-CM | POA: Diagnosis not present

## 2021-07-07 DIAGNOSIS — N132 Hydronephrosis with renal and ureteral calculous obstruction: Secondary | ICD-10-CM | POA: Diagnosis not present

## 2021-07-07 DIAGNOSIS — Z7982 Long term (current) use of aspirin: Secondary | ICD-10-CM | POA: Diagnosis not present

## 2021-07-07 DIAGNOSIS — G4710 Hypersomnia, unspecified: Secondary | ICD-10-CM

## 2021-07-07 DIAGNOSIS — R0683 Snoring: Secondary | ICD-10-CM

## 2021-07-07 DIAGNOSIS — N1831 Chronic kidney disease, stage 3a: Secondary | ICD-10-CM | POA: Diagnosis not present

## 2021-07-07 DIAGNOSIS — N261 Atrophy of kidney (terminal): Secondary | ICD-10-CM | POA: Diagnosis not present

## 2021-07-07 DIAGNOSIS — W19XXXD Unspecified fall, subsequent encounter: Secondary | ICD-10-CM | POA: Diagnosis not present

## 2021-07-07 DIAGNOSIS — I7121 Aneurysm of the ascending aorta, without rupture: Secondary | ICD-10-CM | POA: Diagnosis not present

## 2021-07-07 DIAGNOSIS — D631 Anemia in chronic kidney disease: Secondary | ICD-10-CM | POA: Diagnosis not present

## 2021-07-07 DIAGNOSIS — Z936 Other artificial openings of urinary tract status: Secondary | ICD-10-CM | POA: Diagnosis not present

## 2021-07-10 DIAGNOSIS — Z960 Presence of urogenital implants: Secondary | ICD-10-CM | POA: Diagnosis not present

## 2021-07-10 DIAGNOSIS — Z936 Other artificial openings of urinary tract status: Secondary | ICD-10-CM | POA: Diagnosis not present

## 2021-07-10 DIAGNOSIS — N132 Hydronephrosis with renal and ureteral calculous obstruction: Secondary | ICD-10-CM | POA: Diagnosis not present

## 2021-07-10 DIAGNOSIS — Z7902 Long term (current) use of antithrombotics/antiplatelets: Secondary | ICD-10-CM | POA: Diagnosis not present

## 2021-07-10 DIAGNOSIS — F32A Depression, unspecified: Secondary | ICD-10-CM | POA: Diagnosis not present

## 2021-07-10 DIAGNOSIS — Z9181 History of falling: Secondary | ICD-10-CM | POA: Diagnosis not present

## 2021-07-10 DIAGNOSIS — Z87440 Personal history of urinary (tract) infections: Secondary | ICD-10-CM | POA: Diagnosis not present

## 2021-07-10 DIAGNOSIS — N261 Atrophy of kidney (terminal): Secondary | ICD-10-CM | POA: Diagnosis not present

## 2021-07-10 DIAGNOSIS — D631 Anemia in chronic kidney disease: Secondary | ICD-10-CM | POA: Diagnosis not present

## 2021-07-10 DIAGNOSIS — Z7982 Long term (current) use of aspirin: Secondary | ICD-10-CM | POA: Diagnosis not present

## 2021-07-10 DIAGNOSIS — I7121 Aneurysm of the ascending aorta, without rupture: Secondary | ICD-10-CM | POA: Diagnosis not present

## 2021-07-10 DIAGNOSIS — W19XXXD Unspecified fall, subsequent encounter: Secondary | ICD-10-CM | POA: Diagnosis not present

## 2021-07-10 DIAGNOSIS — Z8616 Personal history of COVID-19: Secondary | ICD-10-CM | POA: Diagnosis not present

## 2021-07-10 DIAGNOSIS — N1831 Chronic kidney disease, stage 3a: Secondary | ICD-10-CM | POA: Diagnosis not present

## 2021-07-10 DIAGNOSIS — I69312 Visuospatial deficit and spatial neglect following cerebral infarction: Secondary | ICD-10-CM | POA: Diagnosis not present

## 2021-07-10 DIAGNOSIS — I129 Hypertensive chronic kidney disease with stage 1 through stage 4 chronic kidney disease, or unspecified chronic kidney disease: Secondary | ICD-10-CM | POA: Diagnosis not present

## 2021-07-10 DIAGNOSIS — G4733 Obstructive sleep apnea (adult) (pediatric): Secondary | ICD-10-CM | POA: Diagnosis not present

## 2021-07-10 DIAGNOSIS — S065X0D Traumatic subdural hemorrhage without loss of consciousness, subsequent encounter: Secondary | ICD-10-CM | POA: Diagnosis not present

## 2021-07-10 DIAGNOSIS — I69354 Hemiplegia and hemiparesis following cerebral infarction affecting left non-dominant side: Secondary | ICD-10-CM | POA: Diagnosis not present

## 2021-07-12 DIAGNOSIS — Z8616 Personal history of COVID-19: Secondary | ICD-10-CM | POA: Diagnosis not present

## 2021-07-12 DIAGNOSIS — I7121 Aneurysm of the ascending aorta, without rupture: Secondary | ICD-10-CM | POA: Diagnosis not present

## 2021-07-12 DIAGNOSIS — Z7902 Long term (current) use of antithrombotics/antiplatelets: Secondary | ICD-10-CM | POA: Diagnosis not present

## 2021-07-12 DIAGNOSIS — N132 Hydronephrosis with renal and ureteral calculous obstruction: Secondary | ICD-10-CM | POA: Diagnosis not present

## 2021-07-12 DIAGNOSIS — Z936 Other artificial openings of urinary tract status: Secondary | ICD-10-CM | POA: Diagnosis not present

## 2021-07-12 DIAGNOSIS — N1831 Chronic kidney disease, stage 3a: Secondary | ICD-10-CM | POA: Diagnosis not present

## 2021-07-12 DIAGNOSIS — N261 Atrophy of kidney (terminal): Secondary | ICD-10-CM | POA: Diagnosis not present

## 2021-07-12 DIAGNOSIS — Z87440 Personal history of urinary (tract) infections: Secondary | ICD-10-CM | POA: Diagnosis not present

## 2021-07-12 DIAGNOSIS — Z960 Presence of urogenital implants: Secondary | ICD-10-CM | POA: Diagnosis not present

## 2021-07-12 DIAGNOSIS — Z7982 Long term (current) use of aspirin: Secondary | ICD-10-CM | POA: Diagnosis not present

## 2021-07-12 DIAGNOSIS — F32A Depression, unspecified: Secondary | ICD-10-CM | POA: Diagnosis not present

## 2021-07-12 DIAGNOSIS — G4733 Obstructive sleep apnea (adult) (pediatric): Secondary | ICD-10-CM | POA: Diagnosis not present

## 2021-07-12 DIAGNOSIS — Z9181 History of falling: Secondary | ICD-10-CM | POA: Diagnosis not present

## 2021-07-12 DIAGNOSIS — I69354 Hemiplegia and hemiparesis following cerebral infarction affecting left non-dominant side: Secondary | ICD-10-CM | POA: Diagnosis not present

## 2021-07-12 DIAGNOSIS — I69312 Visuospatial deficit and spatial neglect following cerebral infarction: Secondary | ICD-10-CM | POA: Diagnosis not present

## 2021-07-12 DIAGNOSIS — D631 Anemia in chronic kidney disease: Secondary | ICD-10-CM | POA: Diagnosis not present

## 2021-07-12 DIAGNOSIS — I129 Hypertensive chronic kidney disease with stage 1 through stage 4 chronic kidney disease, or unspecified chronic kidney disease: Secondary | ICD-10-CM | POA: Diagnosis not present

## 2021-07-12 DIAGNOSIS — W19XXXD Unspecified fall, subsequent encounter: Secondary | ICD-10-CM | POA: Diagnosis not present

## 2021-07-12 DIAGNOSIS — S065X0D Traumatic subdural hemorrhage without loss of consciousness, subsequent encounter: Secondary | ICD-10-CM | POA: Diagnosis not present

## 2021-07-13 DIAGNOSIS — I7121 Aneurysm of the ascending aorta, without rupture: Secondary | ICD-10-CM | POA: Diagnosis not present

## 2021-07-13 DIAGNOSIS — N261 Atrophy of kidney (terminal): Secondary | ICD-10-CM | POA: Diagnosis not present

## 2021-07-13 DIAGNOSIS — I69354 Hemiplegia and hemiparesis following cerebral infarction affecting left non-dominant side: Secondary | ICD-10-CM | POA: Diagnosis not present

## 2021-07-13 DIAGNOSIS — Z87440 Personal history of urinary (tract) infections: Secondary | ICD-10-CM | POA: Diagnosis not present

## 2021-07-13 DIAGNOSIS — Z960 Presence of urogenital implants: Secondary | ICD-10-CM | POA: Diagnosis not present

## 2021-07-13 DIAGNOSIS — I129 Hypertensive chronic kidney disease with stage 1 through stage 4 chronic kidney disease, or unspecified chronic kidney disease: Secondary | ICD-10-CM | POA: Diagnosis not present

## 2021-07-13 DIAGNOSIS — S065X0D Traumatic subdural hemorrhage without loss of consciousness, subsequent encounter: Secondary | ICD-10-CM | POA: Diagnosis not present

## 2021-07-13 DIAGNOSIS — I69312 Visuospatial deficit and spatial neglect following cerebral infarction: Secondary | ICD-10-CM | POA: Diagnosis not present

## 2021-07-13 DIAGNOSIS — F32A Depression, unspecified: Secondary | ICD-10-CM | POA: Diagnosis not present

## 2021-07-13 DIAGNOSIS — Z7902 Long term (current) use of antithrombotics/antiplatelets: Secondary | ICD-10-CM | POA: Diagnosis not present

## 2021-07-13 DIAGNOSIS — D631 Anemia in chronic kidney disease: Secondary | ICD-10-CM | POA: Diagnosis not present

## 2021-07-13 DIAGNOSIS — N1831 Chronic kidney disease, stage 3a: Secondary | ICD-10-CM | POA: Diagnosis not present

## 2021-07-13 DIAGNOSIS — W19XXXD Unspecified fall, subsequent encounter: Secondary | ICD-10-CM | POA: Diagnosis not present

## 2021-07-13 DIAGNOSIS — N132 Hydronephrosis with renal and ureteral calculous obstruction: Secondary | ICD-10-CM | POA: Diagnosis not present

## 2021-07-13 DIAGNOSIS — Z936 Other artificial openings of urinary tract status: Secondary | ICD-10-CM | POA: Diagnosis not present

## 2021-07-13 DIAGNOSIS — Z7982 Long term (current) use of aspirin: Secondary | ICD-10-CM | POA: Diagnosis not present

## 2021-07-13 DIAGNOSIS — G4733 Obstructive sleep apnea (adult) (pediatric): Secondary | ICD-10-CM | POA: Diagnosis not present

## 2021-07-13 DIAGNOSIS — Z9181 History of falling: Secondary | ICD-10-CM | POA: Diagnosis not present

## 2021-07-13 DIAGNOSIS — Z8616 Personal history of COVID-19: Secondary | ICD-10-CM | POA: Diagnosis not present

## 2021-07-14 DIAGNOSIS — Z7902 Long term (current) use of antithrombotics/antiplatelets: Secondary | ICD-10-CM | POA: Diagnosis not present

## 2021-07-14 DIAGNOSIS — I69312 Visuospatial deficit and spatial neglect following cerebral infarction: Secondary | ICD-10-CM | POA: Diagnosis not present

## 2021-07-14 DIAGNOSIS — Z9181 History of falling: Secondary | ICD-10-CM | POA: Diagnosis not present

## 2021-07-14 DIAGNOSIS — F32A Depression, unspecified: Secondary | ICD-10-CM | POA: Diagnosis not present

## 2021-07-14 DIAGNOSIS — S065X0D Traumatic subdural hemorrhage without loss of consciousness, subsequent encounter: Secondary | ICD-10-CM | POA: Diagnosis not present

## 2021-07-14 DIAGNOSIS — N1831 Chronic kidney disease, stage 3a: Secondary | ICD-10-CM | POA: Diagnosis not present

## 2021-07-14 DIAGNOSIS — Z8616 Personal history of COVID-19: Secondary | ICD-10-CM | POA: Diagnosis not present

## 2021-07-14 DIAGNOSIS — I69354 Hemiplegia and hemiparesis following cerebral infarction affecting left non-dominant side: Secondary | ICD-10-CM | POA: Diagnosis not present

## 2021-07-14 DIAGNOSIS — N261 Atrophy of kidney (terminal): Secondary | ICD-10-CM | POA: Diagnosis not present

## 2021-07-14 DIAGNOSIS — Z936 Other artificial openings of urinary tract status: Secondary | ICD-10-CM | POA: Diagnosis not present

## 2021-07-14 DIAGNOSIS — I7121 Aneurysm of the ascending aorta, without rupture: Secondary | ICD-10-CM | POA: Diagnosis not present

## 2021-07-14 DIAGNOSIS — W19XXXD Unspecified fall, subsequent encounter: Secondary | ICD-10-CM | POA: Diagnosis not present

## 2021-07-14 DIAGNOSIS — Z960 Presence of urogenital implants: Secondary | ICD-10-CM | POA: Diagnosis not present

## 2021-07-14 DIAGNOSIS — N132 Hydronephrosis with renal and ureteral calculous obstruction: Secondary | ICD-10-CM | POA: Diagnosis not present

## 2021-07-14 DIAGNOSIS — G4733 Obstructive sleep apnea (adult) (pediatric): Secondary | ICD-10-CM | POA: Diagnosis not present

## 2021-07-14 DIAGNOSIS — Z87440 Personal history of urinary (tract) infections: Secondary | ICD-10-CM | POA: Diagnosis not present

## 2021-07-14 DIAGNOSIS — I129 Hypertensive chronic kidney disease with stage 1 through stage 4 chronic kidney disease, or unspecified chronic kidney disease: Secondary | ICD-10-CM | POA: Diagnosis not present

## 2021-07-14 DIAGNOSIS — D631 Anemia in chronic kidney disease: Secondary | ICD-10-CM | POA: Diagnosis not present

## 2021-07-14 DIAGNOSIS — Z7982 Long term (current) use of aspirin: Secondary | ICD-10-CM | POA: Diagnosis not present

## 2021-07-17 ENCOUNTER — Other Ambulatory Visit: Payer: Medicare Other

## 2021-07-18 ENCOUNTER — Other Ambulatory Visit: Payer: Self-pay

## 2021-07-18 ENCOUNTER — Telehealth: Payer: Self-pay | Admitting: Adult Health

## 2021-07-18 ENCOUNTER — Ambulatory Visit
Admission: RE | Admit: 2021-07-18 | Discharge: 2021-07-18 | Disposition: A | Discharge status: Home / Self Care | Payer: Medicare Other | Source: Ambulatory Visit | Attending: Adult Health | Admitting: Adult Health

## 2021-07-18 DIAGNOSIS — I7121 Aneurysm of the ascending aorta, without rupture: Secondary | ICD-10-CM | POA: Diagnosis not present

## 2021-07-18 DIAGNOSIS — Z87440 Personal history of urinary (tract) infections: Secondary | ICD-10-CM | POA: Diagnosis not present

## 2021-07-18 DIAGNOSIS — N1831 Chronic kidney disease, stage 3a: Secondary | ICD-10-CM | POA: Diagnosis not present

## 2021-07-18 DIAGNOSIS — G4733 Obstructive sleep apnea (adult) (pediatric): Secondary | ICD-10-CM | POA: Diagnosis not present

## 2021-07-18 DIAGNOSIS — I129 Hypertensive chronic kidney disease with stage 1 through stage 4 chronic kidney disease, or unspecified chronic kidney disease: Secondary | ICD-10-CM | POA: Diagnosis not present

## 2021-07-18 DIAGNOSIS — D631 Anemia in chronic kidney disease: Secondary | ICD-10-CM | POA: Diagnosis not present

## 2021-07-18 DIAGNOSIS — N261 Atrophy of kidney (terminal): Secondary | ICD-10-CM | POA: Diagnosis not present

## 2021-07-18 DIAGNOSIS — S065X0D Traumatic subdural hemorrhage without loss of consciousness, subsequent encounter: Secondary | ICD-10-CM | POA: Diagnosis not present

## 2021-07-18 DIAGNOSIS — S0990XA Unspecified injury of head, initial encounter: Secondary | ICD-10-CM | POA: Diagnosis not present

## 2021-07-18 DIAGNOSIS — N132 Hydronephrosis with renal and ureteral calculous obstruction: Secondary | ICD-10-CM | POA: Diagnosis not present

## 2021-07-18 DIAGNOSIS — I69312 Visuospatial deficit and spatial neglect following cerebral infarction: Secondary | ICD-10-CM | POA: Diagnosis not present

## 2021-07-18 DIAGNOSIS — Z9181 History of falling: Secondary | ICD-10-CM | POA: Diagnosis not present

## 2021-07-18 DIAGNOSIS — Z936 Other artificial openings of urinary tract status: Secondary | ICD-10-CM | POA: Diagnosis not present

## 2021-07-18 DIAGNOSIS — F32A Depression, unspecified: Secondary | ICD-10-CM | POA: Diagnosis not present

## 2021-07-18 DIAGNOSIS — Z7982 Long term (current) use of aspirin: Secondary | ICD-10-CM | POA: Diagnosis not present

## 2021-07-18 DIAGNOSIS — W19XXXD Unspecified fall, subsequent encounter: Secondary | ICD-10-CM | POA: Diagnosis not present

## 2021-07-18 DIAGNOSIS — Z960 Presence of urogenital implants: Secondary | ICD-10-CM | POA: Diagnosis not present

## 2021-07-18 DIAGNOSIS — I69354 Hemiplegia and hemiparesis following cerebral infarction affecting left non-dominant side: Secondary | ICD-10-CM | POA: Diagnosis not present

## 2021-07-18 DIAGNOSIS — Z7902 Long term (current) use of antithrombotics/antiplatelets: Secondary | ICD-10-CM | POA: Diagnosis not present

## 2021-07-18 DIAGNOSIS — Z8616 Personal history of COVID-19: Secondary | ICD-10-CM | POA: Diagnosis not present

## 2021-07-18 IMAGING — CT CT HEAD W/O CM
4 series · 16 of 47 positions shown, 18 images · non-contrast
Comparison: [DATE]

CLINICAL DATA: Stroke, follow up recent traumatic SDH.



[Series 2: head 5.00 hr40 s3 axial ibhc · axial · 0.45mm/px · z∈[-574,-434]mm · 7 of 38 slices shown, 9 images]
[im 5/38  brain]
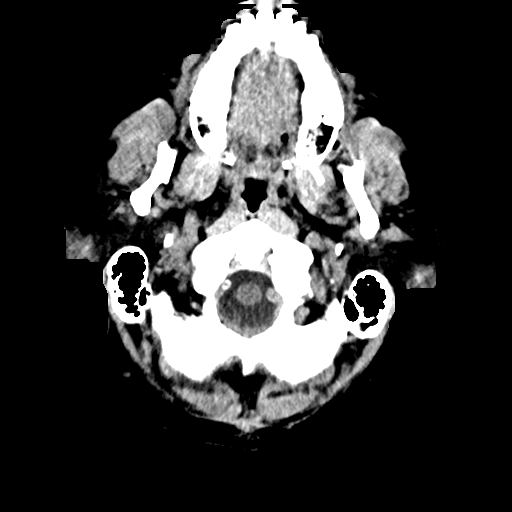
[im 5/38  bone]
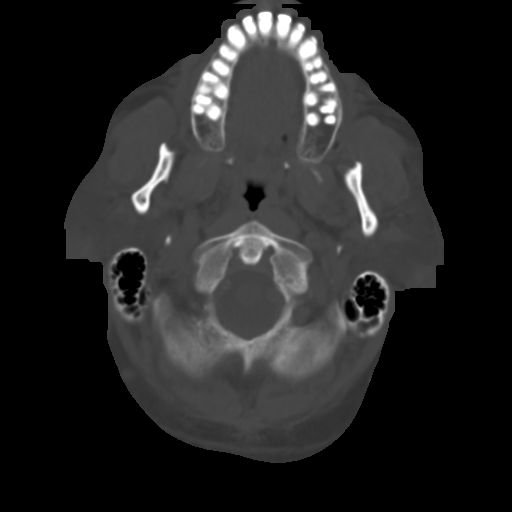
[im 10/38  brain]
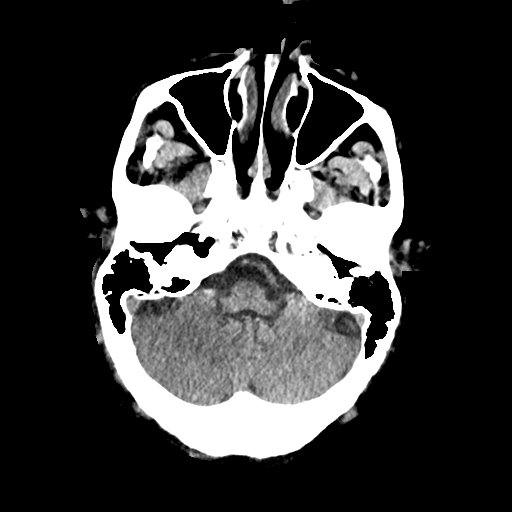
[im 14/38  brain]
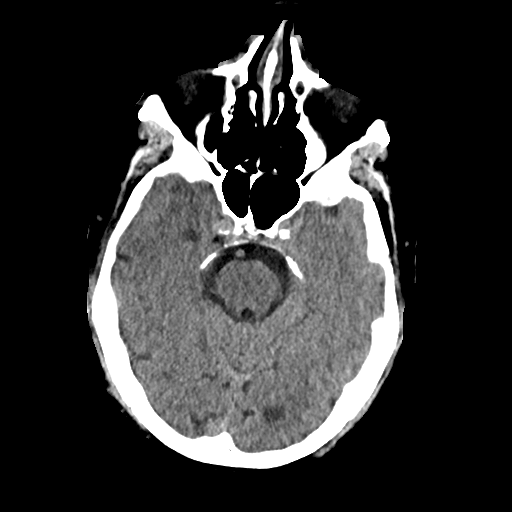
[im 19/38  brain]
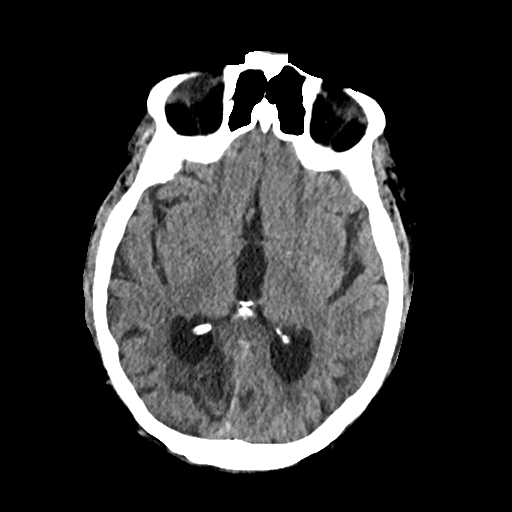
[im 24/38  brain]
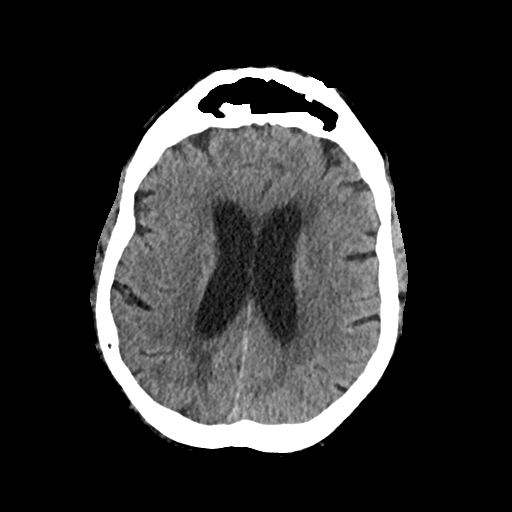
[im 24/38  bone]
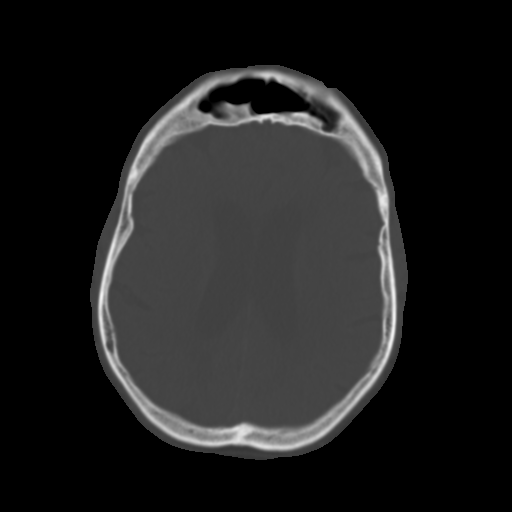
[im 28/38  brain]
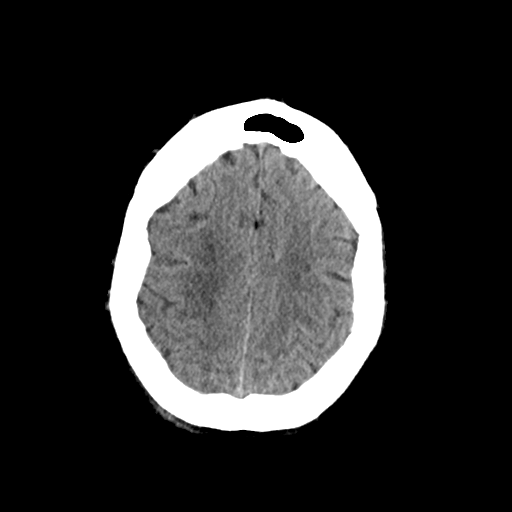
[im 33/38  brain]
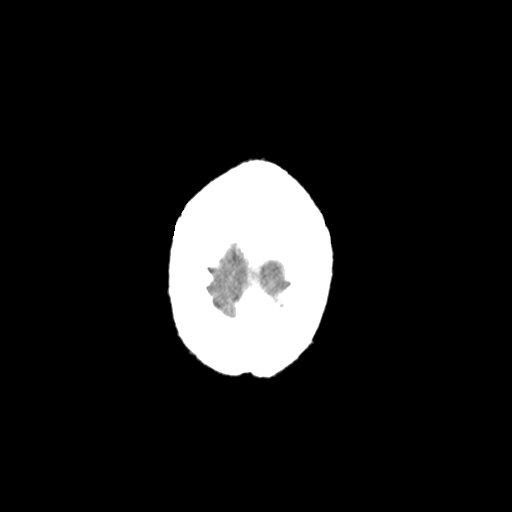

[Series 3: head 2.00 hr60 s3 axial bone · axial · 0.46mm/px · z∈[-578,-540]mm · 3 of 95 slices shown]
[im 10/95  bone]
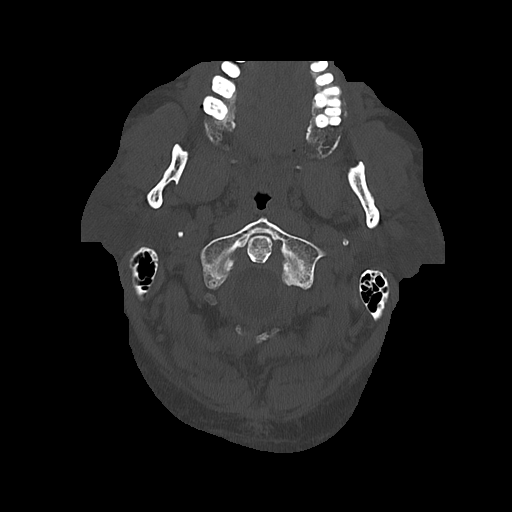
[im 19/95  bone]
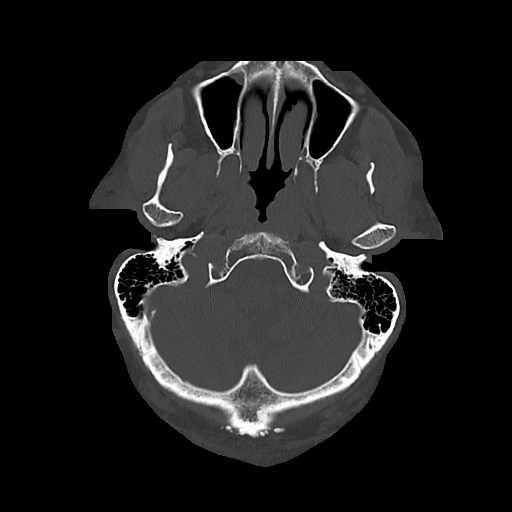
[im 29/95  bone]
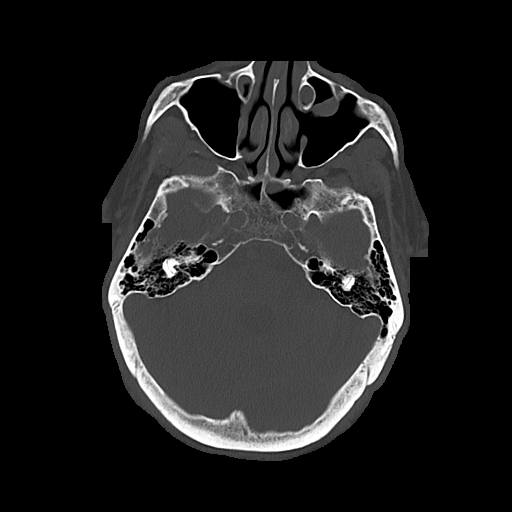

[Series 4: head 3.00 hr40 s3 sag · sagittal · 0.38mm/px · 3 of 64 slices shown]
[im 22/64  brain]
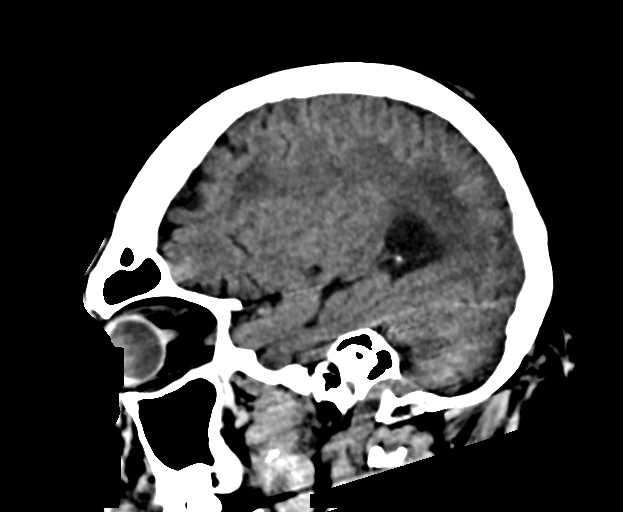
[im 32/64  brain]
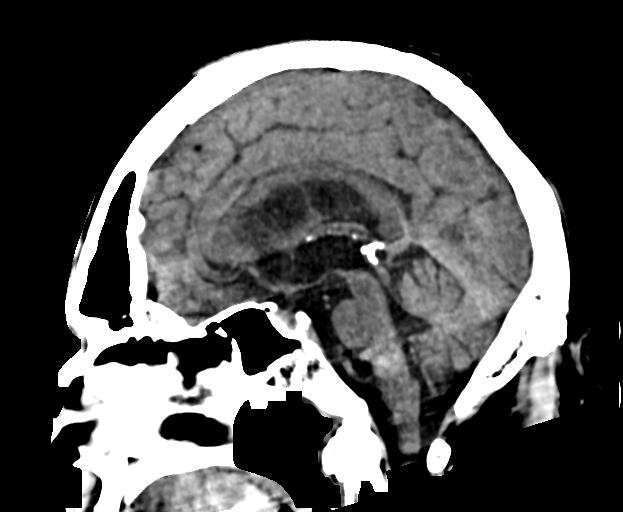
[im 43/64  brain]
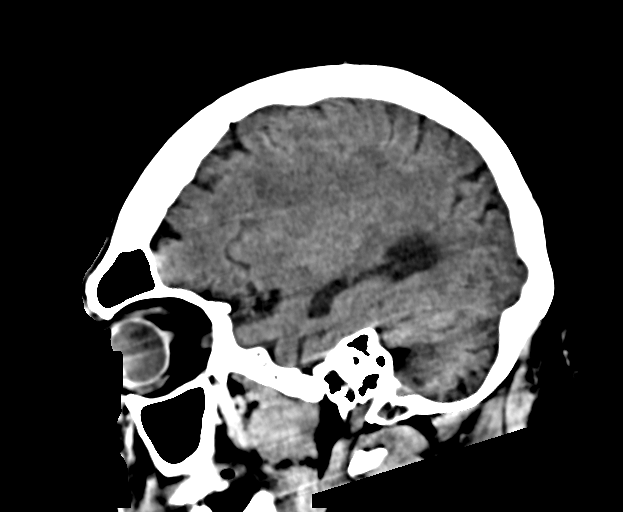

[Series 6: head 3.00 hr40 s3 cor · coronal · 0.38mm/px · 3 of 78 slices shown]
[im 26/78  brain]
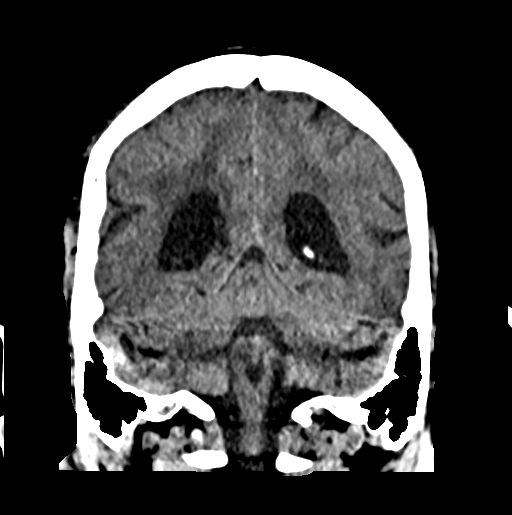
[im 35/78  brain]
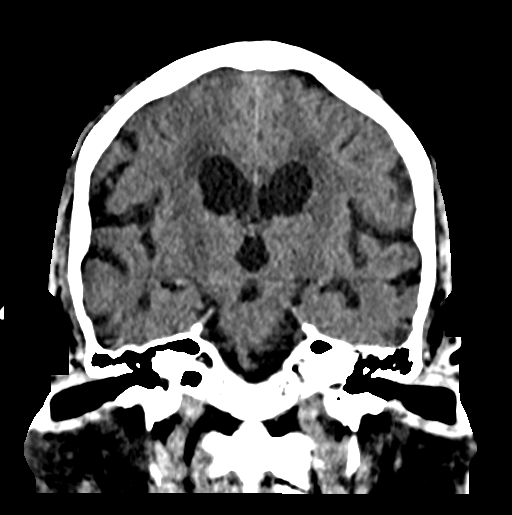
[im 43/78  brain]
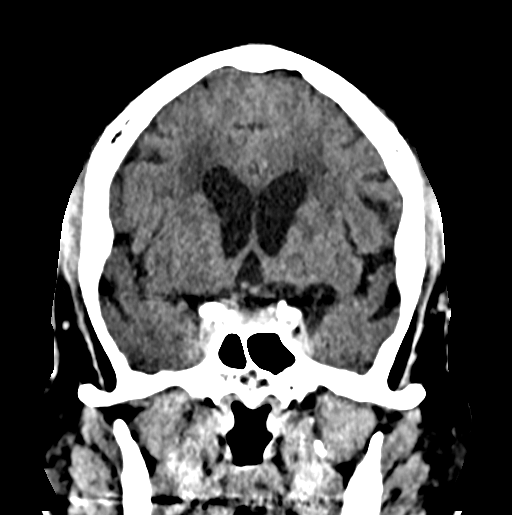

[16 of 47 positions shown; findings below may reference images not displayed]

FINDINGS: Brain: Previously seen subtle subdural hematoma along the posterior
falx is no longer visualized. No acute areas of hemorrhage.
Extensive chronic small vessel disease throughout the deep white
matter. Diffuse cerebral atrophy. Old right occipital infarct,
stable. No acute infarct, hemorrhage or hydrocephalus.

Vascular: No hyperdense vessel or unexpected calcification.

Skull: No acute calvarial abnormality.

Sinuses/Orbits: No acute findings

Other: None
IMPRESSION: Previously seen posterior falcine subdural hematoma has resolved.

No new areas of hemorrhage.

Old right occipital infarct, stable.

Atrophy, chronic microvascular disease.

No acute intracranial abnormality.

## 2021-07-18 NOTE — Telephone Encounter (Signed)
Previously sent message to Dr. Berneice Heinrich and Dr. Pearlean Brownie  "Good afternoon Dr. Berneice Heinrich,   Patient was seen today for hospital follow-up.  Recent strokes in October in setting of intracranial stenosis placed on both aspirin and Plavix with recommended 37-month duration. Recent traumatic SDH 12/14 post fall - aspirin and plavix placed on hold until todays visit. I do plan on repeating CT scan to ensure resolution but discussed restarting aspirin/plavix to complete 3 month duration and single agent alone if imaging looked good but per wife, at recent urology visit, they were told no aspirin or plavix due to procedure that should be taking place around March.  I just want to ensure we are all on the same page in regards to these medications - he is also not on statin due to hx of statin intolerance and wife/patient very reluctant to try any other medications due to his hx (overdose/polypharmacy in the past and sensitivity to medications). I am concerned of increased stroke risk with holding aspirin and plavix but obviously need to weigh risk vs benefit. If you could please clarify your recommendations regarding these medications, that would be great! I have also copied in Dr. Pearlean Brownie to further weigh in.   thank you!  Ihor Austin, NP  [facility]"   Dr. Pearlean Brownie did agree with concerns but I have no yet heard back from Dr. Berneice Heinrich. Can we please contact patient to see if they followed up with Dr. Berneice Heinrich regarding above concerns? If not, can we please fax this to his office to ensure this is reviewed. Thank you!

## 2021-07-18 NOTE — Telephone Encounter (Addendum)
Called patient and wife answered. She is on DPR. I asked if they have heard from Dr Berneice Heinrich recently. She stated last week his office called. He ordered a CT scan of kidney then will schedule to remove kidney. Patient was instructed to hold ASA and plavix until after surgery.  Will let NP know.

## 2021-07-18 NOTE — Telephone Encounter (Signed)
Yes please. Thank you

## 2021-07-18 NOTE — Telephone Encounter (Signed)
Jessica's note dated 07/18/21 faxed to Dr Clint Lipps for clarification re:ASA, Plavix. Received confirmation of fax received.

## 2021-07-19 ENCOUNTER — Other Ambulatory Visit: Payer: Self-pay | Admitting: Adult Health

## 2021-07-19 DIAGNOSIS — I129 Hypertensive chronic kidney disease with stage 1 through stage 4 chronic kidney disease, or unspecified chronic kidney disease: Secondary | ICD-10-CM | POA: Diagnosis not present

## 2021-07-19 DIAGNOSIS — Z87440 Personal history of urinary (tract) infections: Secondary | ICD-10-CM | POA: Diagnosis not present

## 2021-07-19 DIAGNOSIS — Z936 Other artificial openings of urinary tract status: Secondary | ICD-10-CM | POA: Diagnosis not present

## 2021-07-19 DIAGNOSIS — N261 Atrophy of kidney (terminal): Secondary | ICD-10-CM | POA: Diagnosis not present

## 2021-07-19 DIAGNOSIS — N132 Hydronephrosis with renal and ureteral calculous obstruction: Secondary | ICD-10-CM | POA: Diagnosis not present

## 2021-07-19 DIAGNOSIS — I69312 Visuospatial deficit and spatial neglect following cerebral infarction: Secondary | ICD-10-CM | POA: Diagnosis not present

## 2021-07-19 DIAGNOSIS — I69354 Hemiplegia and hemiparesis following cerebral infarction affecting left non-dominant side: Secondary | ICD-10-CM | POA: Diagnosis not present

## 2021-07-19 DIAGNOSIS — Z7982 Long term (current) use of aspirin: Secondary | ICD-10-CM | POA: Diagnosis not present

## 2021-07-19 DIAGNOSIS — D631 Anemia in chronic kidney disease: Secondary | ICD-10-CM | POA: Diagnosis not present

## 2021-07-19 DIAGNOSIS — W19XXXD Unspecified fall, subsequent encounter: Secondary | ICD-10-CM | POA: Diagnosis not present

## 2021-07-19 DIAGNOSIS — Z9181 History of falling: Secondary | ICD-10-CM | POA: Diagnosis not present

## 2021-07-19 DIAGNOSIS — Z960 Presence of urogenital implants: Secondary | ICD-10-CM | POA: Diagnosis not present

## 2021-07-19 DIAGNOSIS — S065X0D Traumatic subdural hemorrhage without loss of consciousness, subsequent encounter: Secondary | ICD-10-CM | POA: Diagnosis not present

## 2021-07-19 DIAGNOSIS — F32A Depression, unspecified: Secondary | ICD-10-CM | POA: Diagnosis not present

## 2021-07-19 DIAGNOSIS — G4733 Obstructive sleep apnea (adult) (pediatric): Secondary | ICD-10-CM | POA: Diagnosis not present

## 2021-07-19 DIAGNOSIS — Z8616 Personal history of COVID-19: Secondary | ICD-10-CM | POA: Diagnosis not present

## 2021-07-19 DIAGNOSIS — I7121 Aneurysm of the ascending aorta, without rupture: Secondary | ICD-10-CM | POA: Diagnosis not present

## 2021-07-19 DIAGNOSIS — N1831 Chronic kidney disease, stage 3a: Secondary | ICD-10-CM | POA: Diagnosis not present

## 2021-07-19 DIAGNOSIS — Z7902 Long term (current) use of antithrombotics/antiplatelets: Secondary | ICD-10-CM | POA: Diagnosis not present

## 2021-07-19 MED ORDER — ASPIRIN EC 81 MG PO TBEC: 81.0000 mg | DELAYED_RELEASE_TABLET | Freq: Every day | ORAL | Status: DC

## 2021-07-20 DIAGNOSIS — I129 Hypertensive chronic kidney disease with stage 1 through stage 4 chronic kidney disease, or unspecified chronic kidney disease: Secondary | ICD-10-CM | POA: Diagnosis not present

## 2021-07-20 DIAGNOSIS — Z936 Other artificial openings of urinary tract status: Secondary | ICD-10-CM | POA: Diagnosis not present

## 2021-07-20 DIAGNOSIS — I69312 Visuospatial deficit and spatial neglect following cerebral infarction: Secondary | ICD-10-CM | POA: Diagnosis not present

## 2021-07-20 DIAGNOSIS — F32A Depression, unspecified: Secondary | ICD-10-CM | POA: Diagnosis not present

## 2021-07-20 DIAGNOSIS — W19XXXD Unspecified fall, subsequent encounter: Secondary | ICD-10-CM | POA: Diagnosis not present

## 2021-07-20 DIAGNOSIS — N132 Hydronephrosis with renal and ureteral calculous obstruction: Secondary | ICD-10-CM | POA: Diagnosis not present

## 2021-07-20 DIAGNOSIS — Z7902 Long term (current) use of antithrombotics/antiplatelets: Secondary | ICD-10-CM | POA: Diagnosis not present

## 2021-07-20 DIAGNOSIS — I7121 Aneurysm of the ascending aorta, without rupture: Secondary | ICD-10-CM | POA: Diagnosis not present

## 2021-07-20 DIAGNOSIS — G4733 Obstructive sleep apnea (adult) (pediatric): Secondary | ICD-10-CM | POA: Diagnosis not present

## 2021-07-20 DIAGNOSIS — N261 Atrophy of kidney (terminal): Secondary | ICD-10-CM | POA: Diagnosis not present

## 2021-07-20 DIAGNOSIS — I69354 Hemiplegia and hemiparesis following cerebral infarction affecting left non-dominant side: Secondary | ICD-10-CM | POA: Diagnosis not present

## 2021-07-20 DIAGNOSIS — Z8616 Personal history of COVID-19: Secondary | ICD-10-CM | POA: Diagnosis not present

## 2021-07-20 DIAGNOSIS — Z9181 History of falling: Secondary | ICD-10-CM | POA: Diagnosis not present

## 2021-07-20 DIAGNOSIS — Z87440 Personal history of urinary (tract) infections: Secondary | ICD-10-CM | POA: Diagnosis not present

## 2021-07-20 DIAGNOSIS — Z960 Presence of urogenital implants: Secondary | ICD-10-CM | POA: Diagnosis not present

## 2021-07-20 DIAGNOSIS — S065X0D Traumatic subdural hemorrhage without loss of consciousness, subsequent encounter: Secondary | ICD-10-CM | POA: Diagnosis not present

## 2021-07-20 DIAGNOSIS — Z7982 Long term (current) use of aspirin: Secondary | ICD-10-CM | POA: Diagnosis not present

## 2021-07-20 DIAGNOSIS — N1831 Chronic kidney disease, stage 3a: Secondary | ICD-10-CM | POA: Diagnosis not present

## 2021-07-20 DIAGNOSIS — D631 Anemia in chronic kidney disease: Secondary | ICD-10-CM | POA: Diagnosis not present

## 2021-07-21 DIAGNOSIS — I129 Hypertensive chronic kidney disease with stage 1 through stage 4 chronic kidney disease, or unspecified chronic kidney disease: Secondary | ICD-10-CM | POA: Diagnosis not present

## 2021-07-21 DIAGNOSIS — I7121 Aneurysm of the ascending aorta, without rupture: Secondary | ICD-10-CM | POA: Diagnosis not present

## 2021-07-21 DIAGNOSIS — N1831 Chronic kidney disease, stage 3a: Secondary | ICD-10-CM | POA: Diagnosis not present

## 2021-07-21 DIAGNOSIS — Z7902 Long term (current) use of antithrombotics/antiplatelets: Secondary | ICD-10-CM | POA: Diagnosis not present

## 2021-07-21 DIAGNOSIS — F32A Depression, unspecified: Secondary | ICD-10-CM | POA: Diagnosis not present

## 2021-07-21 DIAGNOSIS — N261 Atrophy of kidney (terminal): Secondary | ICD-10-CM | POA: Diagnosis not present

## 2021-07-21 DIAGNOSIS — Z936 Other artificial openings of urinary tract status: Secondary | ICD-10-CM | POA: Diagnosis not present

## 2021-07-21 DIAGNOSIS — Z960 Presence of urogenital implants: Secondary | ICD-10-CM | POA: Diagnosis not present

## 2021-07-21 DIAGNOSIS — S065X0D Traumatic subdural hemorrhage without loss of consciousness, subsequent encounter: Secondary | ICD-10-CM | POA: Diagnosis not present

## 2021-07-21 DIAGNOSIS — N132 Hydronephrosis with renal and ureteral calculous obstruction: Secondary | ICD-10-CM | POA: Diagnosis not present

## 2021-07-21 DIAGNOSIS — W19XXXD Unspecified fall, subsequent encounter: Secondary | ICD-10-CM | POA: Diagnosis not present

## 2021-07-21 DIAGNOSIS — G4733 Obstructive sleep apnea (adult) (pediatric): Secondary | ICD-10-CM | POA: Diagnosis not present

## 2021-07-21 DIAGNOSIS — Z9181 History of falling: Secondary | ICD-10-CM | POA: Diagnosis not present

## 2021-07-21 DIAGNOSIS — Z7982 Long term (current) use of aspirin: Secondary | ICD-10-CM | POA: Diagnosis not present

## 2021-07-21 DIAGNOSIS — D631 Anemia in chronic kidney disease: Secondary | ICD-10-CM | POA: Diagnosis not present

## 2021-07-21 DIAGNOSIS — Z87440 Personal history of urinary (tract) infections: Secondary | ICD-10-CM | POA: Diagnosis not present

## 2021-07-21 DIAGNOSIS — I69312 Visuospatial deficit and spatial neglect following cerebral infarction: Secondary | ICD-10-CM | POA: Diagnosis not present

## 2021-07-21 DIAGNOSIS — Z8616 Personal history of COVID-19: Secondary | ICD-10-CM | POA: Diagnosis not present

## 2021-07-21 DIAGNOSIS — I69354 Hemiplegia and hemiparesis following cerebral infarction affecting left non-dominant side: Secondary | ICD-10-CM | POA: Diagnosis not present

## 2021-07-25 DIAGNOSIS — S065X0D Traumatic subdural hemorrhage without loss of consciousness, subsequent encounter: Secondary | ICD-10-CM | POA: Diagnosis not present

## 2021-07-25 DIAGNOSIS — Z936 Other artificial openings of urinary tract status: Secondary | ICD-10-CM | POA: Diagnosis not present

## 2021-07-25 DIAGNOSIS — Z960 Presence of urogenital implants: Secondary | ICD-10-CM | POA: Diagnosis not present

## 2021-07-25 DIAGNOSIS — Z87440 Personal history of urinary (tract) infections: Secondary | ICD-10-CM | POA: Diagnosis not present

## 2021-07-25 DIAGNOSIS — I7121 Aneurysm of the ascending aorta, without rupture: Secondary | ICD-10-CM | POA: Diagnosis not present

## 2021-07-25 DIAGNOSIS — D631 Anemia in chronic kidney disease: Secondary | ICD-10-CM | POA: Diagnosis not present

## 2021-07-25 DIAGNOSIS — I69354 Hemiplegia and hemiparesis following cerebral infarction affecting left non-dominant side: Secondary | ICD-10-CM | POA: Diagnosis not present

## 2021-07-25 DIAGNOSIS — Z7982 Long term (current) use of aspirin: Secondary | ICD-10-CM | POA: Diagnosis not present

## 2021-07-25 DIAGNOSIS — W19XXXD Unspecified fall, subsequent encounter: Secondary | ICD-10-CM | POA: Diagnosis not present

## 2021-07-25 DIAGNOSIS — G4733 Obstructive sleep apnea (adult) (pediatric): Secondary | ICD-10-CM | POA: Diagnosis not present

## 2021-07-25 DIAGNOSIS — Z7902 Long term (current) use of antithrombotics/antiplatelets: Secondary | ICD-10-CM | POA: Diagnosis not present

## 2021-07-25 DIAGNOSIS — N261 Atrophy of kidney (terminal): Secondary | ICD-10-CM | POA: Diagnosis not present

## 2021-07-25 DIAGNOSIS — N1831 Chronic kidney disease, stage 3a: Secondary | ICD-10-CM | POA: Diagnosis not present

## 2021-07-25 DIAGNOSIS — Z8616 Personal history of COVID-19: Secondary | ICD-10-CM | POA: Diagnosis not present

## 2021-07-25 DIAGNOSIS — F32A Depression, unspecified: Secondary | ICD-10-CM | POA: Diagnosis not present

## 2021-07-25 DIAGNOSIS — Z9181 History of falling: Secondary | ICD-10-CM | POA: Diagnosis not present

## 2021-07-25 DIAGNOSIS — I69312 Visuospatial deficit and spatial neglect following cerebral infarction: Secondary | ICD-10-CM | POA: Diagnosis not present

## 2021-07-25 DIAGNOSIS — I129 Hypertensive chronic kidney disease with stage 1 through stage 4 chronic kidney disease, or unspecified chronic kidney disease: Secondary | ICD-10-CM | POA: Diagnosis not present

## 2021-07-25 DIAGNOSIS — N132 Hydronephrosis with renal and ureteral calculous obstruction: Secondary | ICD-10-CM | POA: Diagnosis not present

## 2021-07-26 DIAGNOSIS — W19XXXD Unspecified fall, subsequent encounter: Secondary | ICD-10-CM | POA: Diagnosis not present

## 2021-07-26 DIAGNOSIS — I69354 Hemiplegia and hemiparesis following cerebral infarction affecting left non-dominant side: Secondary | ICD-10-CM | POA: Diagnosis not present

## 2021-07-26 DIAGNOSIS — Z960 Presence of urogenital implants: Secondary | ICD-10-CM | POA: Diagnosis not present

## 2021-07-26 DIAGNOSIS — Z7982 Long term (current) use of aspirin: Secondary | ICD-10-CM | POA: Diagnosis not present

## 2021-07-26 DIAGNOSIS — I7121 Aneurysm of the ascending aorta, without rupture: Secondary | ICD-10-CM | POA: Diagnosis not present

## 2021-07-26 DIAGNOSIS — I69312 Visuospatial deficit and spatial neglect following cerebral infarction: Secondary | ICD-10-CM | POA: Diagnosis not present

## 2021-07-26 DIAGNOSIS — D631 Anemia in chronic kidney disease: Secondary | ICD-10-CM | POA: Diagnosis not present

## 2021-07-26 DIAGNOSIS — N1831 Chronic kidney disease, stage 3a: Secondary | ICD-10-CM | POA: Diagnosis not present

## 2021-07-26 DIAGNOSIS — Z7902 Long term (current) use of antithrombotics/antiplatelets: Secondary | ICD-10-CM | POA: Diagnosis not present

## 2021-07-26 DIAGNOSIS — N261 Atrophy of kidney (terminal): Secondary | ICD-10-CM | POA: Diagnosis not present

## 2021-07-26 DIAGNOSIS — F32A Depression, unspecified: Secondary | ICD-10-CM | POA: Diagnosis not present

## 2021-07-26 DIAGNOSIS — Z936 Other artificial openings of urinary tract status: Secondary | ICD-10-CM | POA: Diagnosis not present

## 2021-07-26 DIAGNOSIS — Z8616 Personal history of COVID-19: Secondary | ICD-10-CM | POA: Diagnosis not present

## 2021-07-26 DIAGNOSIS — S065X0D Traumatic subdural hemorrhage without loss of consciousness, subsequent encounter: Secondary | ICD-10-CM | POA: Diagnosis not present

## 2021-07-26 DIAGNOSIS — G4733 Obstructive sleep apnea (adult) (pediatric): Secondary | ICD-10-CM | POA: Diagnosis not present

## 2021-07-26 DIAGNOSIS — I129 Hypertensive chronic kidney disease with stage 1 through stage 4 chronic kidney disease, or unspecified chronic kidney disease: Secondary | ICD-10-CM | POA: Diagnosis not present

## 2021-07-26 DIAGNOSIS — N132 Hydronephrosis with renal and ureteral calculous obstruction: Secondary | ICD-10-CM | POA: Diagnosis not present

## 2021-07-26 DIAGNOSIS — Z87440 Personal history of urinary (tract) infections: Secondary | ICD-10-CM | POA: Diagnosis not present

## 2021-07-26 DIAGNOSIS — Z9181 History of falling: Secondary | ICD-10-CM | POA: Diagnosis not present

## 2021-07-27 DIAGNOSIS — N261 Atrophy of kidney (terminal): Secondary | ICD-10-CM | POA: Diagnosis not present

## 2021-07-27 DIAGNOSIS — W19XXXD Unspecified fall, subsequent encounter: Secondary | ICD-10-CM | POA: Diagnosis not present

## 2021-07-27 DIAGNOSIS — I7121 Aneurysm of the ascending aorta, without rupture: Secondary | ICD-10-CM | POA: Diagnosis not present

## 2021-07-27 DIAGNOSIS — Z936 Other artificial openings of urinary tract status: Secondary | ICD-10-CM | POA: Diagnosis not present

## 2021-07-27 DIAGNOSIS — I69354 Hemiplegia and hemiparesis following cerebral infarction affecting left non-dominant side: Secondary | ICD-10-CM | POA: Diagnosis not present

## 2021-07-27 DIAGNOSIS — Z7902 Long term (current) use of antithrombotics/antiplatelets: Secondary | ICD-10-CM | POA: Diagnosis not present

## 2021-07-27 DIAGNOSIS — Z8616 Personal history of COVID-19: Secondary | ICD-10-CM | POA: Diagnosis not present

## 2021-07-27 DIAGNOSIS — Z960 Presence of urogenital implants: Secondary | ICD-10-CM | POA: Diagnosis not present

## 2021-07-27 DIAGNOSIS — I129 Hypertensive chronic kidney disease with stage 1 through stage 4 chronic kidney disease, or unspecified chronic kidney disease: Secondary | ICD-10-CM | POA: Diagnosis not present

## 2021-07-27 DIAGNOSIS — G4733 Obstructive sleep apnea (adult) (pediatric): Secondary | ICD-10-CM | POA: Diagnosis not present

## 2021-07-27 DIAGNOSIS — I69312 Visuospatial deficit and spatial neglect following cerebral infarction: Secondary | ICD-10-CM | POA: Diagnosis not present

## 2021-07-27 DIAGNOSIS — Z9181 History of falling: Secondary | ICD-10-CM | POA: Diagnosis not present

## 2021-07-27 DIAGNOSIS — Z7982 Long term (current) use of aspirin: Secondary | ICD-10-CM | POA: Diagnosis not present

## 2021-07-27 DIAGNOSIS — F32A Depression, unspecified: Secondary | ICD-10-CM | POA: Diagnosis not present

## 2021-07-27 DIAGNOSIS — N132 Hydronephrosis with renal and ureteral calculous obstruction: Secondary | ICD-10-CM | POA: Diagnosis not present

## 2021-07-27 DIAGNOSIS — S065X0D Traumatic subdural hemorrhage without loss of consciousness, subsequent encounter: Secondary | ICD-10-CM | POA: Diagnosis not present

## 2021-07-27 DIAGNOSIS — N1831 Chronic kidney disease, stage 3a: Secondary | ICD-10-CM | POA: Diagnosis not present

## 2021-07-27 DIAGNOSIS — D631 Anemia in chronic kidney disease: Secondary | ICD-10-CM | POA: Diagnosis not present

## 2021-07-27 DIAGNOSIS — Z87440 Personal history of urinary (tract) infections: Secondary | ICD-10-CM | POA: Diagnosis not present

## 2021-07-31 DIAGNOSIS — Z87440 Personal history of urinary (tract) infections: Secondary | ICD-10-CM | POA: Diagnosis not present

## 2021-07-31 DIAGNOSIS — Z8616 Personal history of COVID-19: Secondary | ICD-10-CM | POA: Diagnosis not present

## 2021-07-31 DIAGNOSIS — I69312 Visuospatial deficit and spatial neglect following cerebral infarction: Secondary | ICD-10-CM | POA: Diagnosis not present

## 2021-07-31 DIAGNOSIS — I69354 Hemiplegia and hemiparesis following cerebral infarction affecting left non-dominant side: Secondary | ICD-10-CM | POA: Diagnosis not present

## 2021-07-31 DIAGNOSIS — D631 Anemia in chronic kidney disease: Secondary | ICD-10-CM | POA: Diagnosis not present

## 2021-07-31 DIAGNOSIS — N261 Atrophy of kidney (terminal): Secondary | ICD-10-CM | POA: Diagnosis not present

## 2021-07-31 DIAGNOSIS — W19XXXD Unspecified fall, subsequent encounter: Secondary | ICD-10-CM | POA: Diagnosis not present

## 2021-07-31 DIAGNOSIS — I7121 Aneurysm of the ascending aorta, without rupture: Secondary | ICD-10-CM | POA: Diagnosis not present

## 2021-07-31 DIAGNOSIS — Z7982 Long term (current) use of aspirin: Secondary | ICD-10-CM | POA: Diagnosis not present

## 2021-07-31 DIAGNOSIS — S065X0D Traumatic subdural hemorrhage without loss of consciousness, subsequent encounter: Secondary | ICD-10-CM | POA: Diagnosis not present

## 2021-07-31 DIAGNOSIS — G4733 Obstructive sleep apnea (adult) (pediatric): Secondary | ICD-10-CM | POA: Diagnosis not present

## 2021-07-31 DIAGNOSIS — Z7902 Long term (current) use of antithrombotics/antiplatelets: Secondary | ICD-10-CM | POA: Diagnosis not present

## 2021-07-31 DIAGNOSIS — Z960 Presence of urogenital implants: Secondary | ICD-10-CM | POA: Diagnosis not present

## 2021-07-31 DIAGNOSIS — Z9181 History of falling: Secondary | ICD-10-CM | POA: Diagnosis not present

## 2021-07-31 DIAGNOSIS — F32A Depression, unspecified: Secondary | ICD-10-CM | POA: Diagnosis not present

## 2021-07-31 DIAGNOSIS — I129 Hypertensive chronic kidney disease with stage 1 through stage 4 chronic kidney disease, or unspecified chronic kidney disease: Secondary | ICD-10-CM | POA: Diagnosis not present

## 2021-07-31 DIAGNOSIS — N132 Hydronephrosis with renal and ureteral calculous obstruction: Secondary | ICD-10-CM | POA: Diagnosis not present

## 2021-07-31 DIAGNOSIS — Z936 Other artificial openings of urinary tract status: Secondary | ICD-10-CM | POA: Diagnosis not present

## 2021-07-31 DIAGNOSIS — N1831 Chronic kidney disease, stage 3a: Secondary | ICD-10-CM | POA: Diagnosis not present

## 2021-08-01 ENCOUNTER — Inpatient Hospital Stay: Payer: Medicare Other | Admitting: Adult Health

## 2021-08-02 DIAGNOSIS — Z9181 History of falling: Secondary | ICD-10-CM | POA: Diagnosis not present

## 2021-08-02 DIAGNOSIS — Z7902 Long term (current) use of antithrombotics/antiplatelets: Secondary | ICD-10-CM | POA: Diagnosis not present

## 2021-08-02 DIAGNOSIS — N132 Hydronephrosis with renal and ureteral calculous obstruction: Secondary | ICD-10-CM | POA: Diagnosis not present

## 2021-08-02 DIAGNOSIS — Z8616 Personal history of COVID-19: Secondary | ICD-10-CM | POA: Diagnosis not present

## 2021-08-02 DIAGNOSIS — Z7982 Long term (current) use of aspirin: Secondary | ICD-10-CM | POA: Diagnosis not present

## 2021-08-02 DIAGNOSIS — D631 Anemia in chronic kidney disease: Secondary | ICD-10-CM | POA: Diagnosis not present

## 2021-08-02 DIAGNOSIS — N261 Atrophy of kidney (terminal): Secondary | ICD-10-CM | POA: Diagnosis not present

## 2021-08-02 DIAGNOSIS — Z87440 Personal history of urinary (tract) infections: Secondary | ICD-10-CM | POA: Diagnosis not present

## 2021-08-02 DIAGNOSIS — Z960 Presence of urogenital implants: Secondary | ICD-10-CM | POA: Diagnosis not present

## 2021-08-02 DIAGNOSIS — I129 Hypertensive chronic kidney disease with stage 1 through stage 4 chronic kidney disease, or unspecified chronic kidney disease: Secondary | ICD-10-CM | POA: Diagnosis not present

## 2021-08-02 DIAGNOSIS — W19XXXD Unspecified fall, subsequent encounter: Secondary | ICD-10-CM | POA: Diagnosis not present

## 2021-08-02 DIAGNOSIS — N1831 Chronic kidney disease, stage 3a: Secondary | ICD-10-CM | POA: Diagnosis not present

## 2021-08-02 DIAGNOSIS — I69354 Hemiplegia and hemiparesis following cerebral infarction affecting left non-dominant side: Secondary | ICD-10-CM | POA: Diagnosis not present

## 2021-08-02 DIAGNOSIS — G4733 Obstructive sleep apnea (adult) (pediatric): Secondary | ICD-10-CM | POA: Diagnosis not present

## 2021-08-02 DIAGNOSIS — I7121 Aneurysm of the ascending aorta, without rupture: Secondary | ICD-10-CM | POA: Diagnosis not present

## 2021-08-02 DIAGNOSIS — Z936 Other artificial openings of urinary tract status: Secondary | ICD-10-CM | POA: Diagnosis not present

## 2021-08-02 DIAGNOSIS — S065X0D Traumatic subdural hemorrhage without loss of consciousness, subsequent encounter: Secondary | ICD-10-CM | POA: Diagnosis not present

## 2021-08-02 DIAGNOSIS — F32A Depression, unspecified: Secondary | ICD-10-CM | POA: Diagnosis not present

## 2021-08-02 DIAGNOSIS — I69312 Visuospatial deficit and spatial neglect following cerebral infarction: Secondary | ICD-10-CM | POA: Diagnosis not present

## 2021-08-03 DIAGNOSIS — F32A Depression, unspecified: Secondary | ICD-10-CM | POA: Diagnosis not present

## 2021-08-03 DIAGNOSIS — Z87440 Personal history of urinary (tract) infections: Secondary | ICD-10-CM | POA: Diagnosis not present

## 2021-08-03 DIAGNOSIS — I7121 Aneurysm of the ascending aorta, without rupture: Secondary | ICD-10-CM | POA: Diagnosis not present

## 2021-08-03 DIAGNOSIS — N261 Atrophy of kidney (terminal): Secondary | ICD-10-CM | POA: Diagnosis not present

## 2021-08-03 DIAGNOSIS — W19XXXD Unspecified fall, subsequent encounter: Secondary | ICD-10-CM | POA: Diagnosis not present

## 2021-08-03 DIAGNOSIS — G4733 Obstructive sleep apnea (adult) (pediatric): Secondary | ICD-10-CM | POA: Diagnosis not present

## 2021-08-03 DIAGNOSIS — Z8616 Personal history of COVID-19: Secondary | ICD-10-CM | POA: Diagnosis not present

## 2021-08-03 DIAGNOSIS — N132 Hydronephrosis with renal and ureteral calculous obstruction: Secondary | ICD-10-CM | POA: Diagnosis not present

## 2021-08-03 DIAGNOSIS — Z960 Presence of urogenital implants: Secondary | ICD-10-CM | POA: Diagnosis not present

## 2021-08-03 DIAGNOSIS — D631 Anemia in chronic kidney disease: Secondary | ICD-10-CM | POA: Diagnosis not present

## 2021-08-03 DIAGNOSIS — Z7982 Long term (current) use of aspirin: Secondary | ICD-10-CM | POA: Diagnosis not present

## 2021-08-03 DIAGNOSIS — I69312 Visuospatial deficit and spatial neglect following cerebral infarction: Secondary | ICD-10-CM | POA: Diagnosis not present

## 2021-08-03 DIAGNOSIS — Z7902 Long term (current) use of antithrombotics/antiplatelets: Secondary | ICD-10-CM | POA: Diagnosis not present

## 2021-08-03 DIAGNOSIS — N1831 Chronic kidney disease, stage 3a: Secondary | ICD-10-CM | POA: Diagnosis not present

## 2021-08-03 DIAGNOSIS — I69354 Hemiplegia and hemiparesis following cerebral infarction affecting left non-dominant side: Secondary | ICD-10-CM | POA: Diagnosis not present

## 2021-08-03 DIAGNOSIS — Z9181 History of falling: Secondary | ICD-10-CM | POA: Diagnosis not present

## 2021-08-03 DIAGNOSIS — Z936 Other artificial openings of urinary tract status: Secondary | ICD-10-CM | POA: Diagnosis not present

## 2021-08-03 DIAGNOSIS — S065X0D Traumatic subdural hemorrhage without loss of consciousness, subsequent encounter: Secondary | ICD-10-CM | POA: Diagnosis not present

## 2021-08-03 DIAGNOSIS — I129 Hypertensive chronic kidney disease with stage 1 through stage 4 chronic kidney disease, or unspecified chronic kidney disease: Secondary | ICD-10-CM | POA: Diagnosis not present

## 2021-08-08 ENCOUNTER — Other Ambulatory Visit: Payer: Self-pay

## 2021-08-08 ENCOUNTER — Ambulatory Visit (HOSPITAL_BASED_OUTPATIENT_CLINIC_OR_DEPARTMENT_OTHER): Payer: Medicare Other | Attending: Internal Medicine | Admitting: Sleep Medicine

## 2021-08-08 DIAGNOSIS — I10 Essential (primary) hypertension: Secondary | ICD-10-CM | POA: Insufficient documentation

## 2021-08-08 DIAGNOSIS — R5383 Other fatigue: Secondary | ICD-10-CM | POA: Diagnosis not present

## 2021-08-08 DIAGNOSIS — R0683 Snoring: Secondary | ICD-10-CM | POA: Insufficient documentation

## 2021-08-08 DIAGNOSIS — G4731 Primary central sleep apnea: Secondary | ICD-10-CM | POA: Insufficient documentation

## 2021-08-08 DIAGNOSIS — Z936 Other artificial openings of urinary tract status: Secondary | ICD-10-CM | POA: Diagnosis not present

## 2021-08-08 DIAGNOSIS — Z7902 Long term (current) use of antithrombotics/antiplatelets: Secondary | ICD-10-CM | POA: Diagnosis not present

## 2021-08-08 DIAGNOSIS — F32A Depression, unspecified: Secondary | ICD-10-CM | POA: Insufficient documentation

## 2021-08-08 DIAGNOSIS — D631 Anemia in chronic kidney disease: Secondary | ICD-10-CM | POA: Diagnosis not present

## 2021-08-08 DIAGNOSIS — G4710 Hypersomnia, unspecified: Secondary | ICD-10-CM | POA: Insufficient documentation

## 2021-08-08 DIAGNOSIS — G4733 Obstructive sleep apnea (adult) (pediatric): Secondary | ICD-10-CM | POA: Diagnosis not present

## 2021-08-08 DIAGNOSIS — S065X0D Traumatic subdural hemorrhage without loss of consciousness, subsequent encounter: Secondary | ICD-10-CM | POA: Diagnosis not present

## 2021-08-08 DIAGNOSIS — R413 Other amnesia: Secondary | ICD-10-CM | POA: Insufficient documentation

## 2021-08-08 DIAGNOSIS — Z9181 History of falling: Secondary | ICD-10-CM | POA: Diagnosis not present

## 2021-08-08 DIAGNOSIS — N1831 Chronic kidney disease, stage 3a: Secondary | ICD-10-CM | POA: Diagnosis not present

## 2021-08-08 DIAGNOSIS — I69312 Visuospatial deficit and spatial neglect following cerebral infarction: Secondary | ICD-10-CM | POA: Diagnosis not present

## 2021-08-08 DIAGNOSIS — N261 Atrophy of kidney (terminal): Secondary | ICD-10-CM | POA: Diagnosis not present

## 2021-08-08 DIAGNOSIS — W19XXXD Unspecified fall, subsequent encounter: Secondary | ICD-10-CM | POA: Diagnosis not present

## 2021-08-08 DIAGNOSIS — Z87440 Personal history of urinary (tract) infections: Secondary | ICD-10-CM | POA: Diagnosis not present

## 2021-08-08 DIAGNOSIS — Z7982 Long term (current) use of aspirin: Secondary | ICD-10-CM | POA: Diagnosis not present

## 2021-08-08 DIAGNOSIS — N132 Hydronephrosis with renal and ureteral calculous obstruction: Secondary | ICD-10-CM | POA: Diagnosis not present

## 2021-08-08 DIAGNOSIS — Z960 Presence of urogenital implants: Secondary | ICD-10-CM | POA: Diagnosis not present

## 2021-08-08 DIAGNOSIS — Z8616 Personal history of COVID-19: Secondary | ICD-10-CM | POA: Diagnosis not present

## 2021-08-08 DIAGNOSIS — I69354 Hemiplegia and hemiparesis following cerebral infarction affecting left non-dominant side: Secondary | ICD-10-CM | POA: Diagnosis not present

## 2021-08-08 DIAGNOSIS — I129 Hypertensive chronic kidney disease with stage 1 through stage 4 chronic kidney disease, or unspecified chronic kidney disease: Secondary | ICD-10-CM | POA: Diagnosis not present

## 2021-08-08 DIAGNOSIS — I7121 Aneurysm of the ascending aorta, without rupture: Secondary | ICD-10-CM | POA: Diagnosis not present

## 2021-08-09 DIAGNOSIS — I129 Hypertensive chronic kidney disease with stage 1 through stage 4 chronic kidney disease, or unspecified chronic kidney disease: Secondary | ICD-10-CM | POA: Diagnosis not present

## 2021-08-09 DIAGNOSIS — Z87440 Personal history of urinary (tract) infections: Secondary | ICD-10-CM | POA: Diagnosis not present

## 2021-08-09 DIAGNOSIS — I69312 Visuospatial deficit and spatial neglect following cerebral infarction: Secondary | ICD-10-CM | POA: Diagnosis not present

## 2021-08-09 DIAGNOSIS — I7121 Aneurysm of the ascending aorta, without rupture: Secondary | ICD-10-CM | POA: Diagnosis not present

## 2021-08-09 DIAGNOSIS — Z7982 Long term (current) use of aspirin: Secondary | ICD-10-CM | POA: Diagnosis not present

## 2021-08-09 DIAGNOSIS — I69354 Hemiplegia and hemiparesis following cerebral infarction affecting left non-dominant side: Secondary | ICD-10-CM | POA: Diagnosis not present

## 2021-08-09 DIAGNOSIS — Z936 Other artificial openings of urinary tract status: Secondary | ICD-10-CM | POA: Diagnosis not present

## 2021-08-09 DIAGNOSIS — D631 Anemia in chronic kidney disease: Secondary | ICD-10-CM | POA: Diagnosis not present

## 2021-08-09 DIAGNOSIS — N132 Hydronephrosis with renal and ureteral calculous obstruction: Secondary | ICD-10-CM | POA: Diagnosis not present

## 2021-08-09 DIAGNOSIS — W19XXXD Unspecified fall, subsequent encounter: Secondary | ICD-10-CM | POA: Diagnosis not present

## 2021-08-09 DIAGNOSIS — Z9181 History of falling: Secondary | ICD-10-CM | POA: Diagnosis not present

## 2021-08-09 DIAGNOSIS — G4733 Obstructive sleep apnea (adult) (pediatric): Secondary | ICD-10-CM | POA: Diagnosis not present

## 2021-08-09 DIAGNOSIS — Z7902 Long term (current) use of antithrombotics/antiplatelets: Secondary | ICD-10-CM | POA: Diagnosis not present

## 2021-08-09 DIAGNOSIS — N261 Atrophy of kidney (terminal): Secondary | ICD-10-CM | POA: Diagnosis not present

## 2021-08-09 DIAGNOSIS — F32A Depression, unspecified: Secondary | ICD-10-CM | POA: Diagnosis not present

## 2021-08-09 DIAGNOSIS — Z960 Presence of urogenital implants: Secondary | ICD-10-CM | POA: Diagnosis not present

## 2021-08-09 DIAGNOSIS — S065X0D Traumatic subdural hemorrhage without loss of consciousness, subsequent encounter: Secondary | ICD-10-CM | POA: Diagnosis not present

## 2021-08-09 DIAGNOSIS — N1831 Chronic kidney disease, stage 3a: Secondary | ICD-10-CM | POA: Diagnosis not present

## 2021-08-09 DIAGNOSIS — Z8616 Personal history of COVID-19: Secondary | ICD-10-CM | POA: Diagnosis not present

## 2021-08-10 DIAGNOSIS — I69354 Hemiplegia and hemiparesis following cerebral infarction affecting left non-dominant side: Secondary | ICD-10-CM | POA: Diagnosis not present

## 2021-08-10 DIAGNOSIS — I129 Hypertensive chronic kidney disease with stage 1 through stage 4 chronic kidney disease, or unspecified chronic kidney disease: Secondary | ICD-10-CM | POA: Diagnosis not present

## 2021-08-10 DIAGNOSIS — N132 Hydronephrosis with renal and ureteral calculous obstruction: Secondary | ICD-10-CM | POA: Diagnosis not present

## 2021-08-10 DIAGNOSIS — I7121 Aneurysm of the ascending aorta, without rupture: Secondary | ICD-10-CM | POA: Diagnosis not present

## 2021-08-10 DIAGNOSIS — S065X0D Traumatic subdural hemorrhage without loss of consciousness, subsequent encounter: Secondary | ICD-10-CM | POA: Diagnosis not present

## 2021-08-10 DIAGNOSIS — N1831 Chronic kidney disease, stage 3a: Secondary | ICD-10-CM | POA: Diagnosis not present

## 2021-08-10 DIAGNOSIS — Z7902 Long term (current) use of antithrombotics/antiplatelets: Secondary | ICD-10-CM | POA: Diagnosis not present

## 2021-08-10 DIAGNOSIS — W19XXXD Unspecified fall, subsequent encounter: Secondary | ICD-10-CM | POA: Diagnosis not present

## 2021-08-10 DIAGNOSIS — F32A Depression, unspecified: Secondary | ICD-10-CM | POA: Diagnosis not present

## 2021-08-10 DIAGNOSIS — Z8616 Personal history of COVID-19: Secondary | ICD-10-CM | POA: Diagnosis not present

## 2021-08-10 DIAGNOSIS — Z9181 History of falling: Secondary | ICD-10-CM | POA: Diagnosis not present

## 2021-08-10 DIAGNOSIS — G4733 Obstructive sleep apnea (adult) (pediatric): Secondary | ICD-10-CM | POA: Diagnosis not present

## 2021-08-10 DIAGNOSIS — I69312 Visuospatial deficit and spatial neglect following cerebral infarction: Secondary | ICD-10-CM | POA: Diagnosis not present

## 2021-08-10 DIAGNOSIS — N261 Atrophy of kidney (terminal): Secondary | ICD-10-CM | POA: Diagnosis not present

## 2021-08-10 DIAGNOSIS — Z7982 Long term (current) use of aspirin: Secondary | ICD-10-CM | POA: Diagnosis not present

## 2021-08-10 DIAGNOSIS — Z87440 Personal history of urinary (tract) infections: Secondary | ICD-10-CM | POA: Diagnosis not present

## 2021-08-10 DIAGNOSIS — D631 Anemia in chronic kidney disease: Secondary | ICD-10-CM | POA: Diagnosis not present

## 2021-08-10 DIAGNOSIS — Z960 Presence of urogenital implants: Secondary | ICD-10-CM | POA: Diagnosis not present

## 2021-08-10 DIAGNOSIS — Z936 Other artificial openings of urinary tract status: Secondary | ICD-10-CM | POA: Diagnosis not present

## 2021-08-11 DIAGNOSIS — Z7902 Long term (current) use of antithrombotics/antiplatelets: Secondary | ICD-10-CM | POA: Diagnosis not present

## 2021-08-11 DIAGNOSIS — S065X0D Traumatic subdural hemorrhage without loss of consciousness, subsequent encounter: Secondary | ICD-10-CM | POA: Diagnosis not present

## 2021-08-11 DIAGNOSIS — Z7982 Long term (current) use of aspirin: Secondary | ICD-10-CM | POA: Diagnosis not present

## 2021-08-11 DIAGNOSIS — D631 Anemia in chronic kidney disease: Secondary | ICD-10-CM | POA: Diagnosis not present

## 2021-08-11 DIAGNOSIS — Z960 Presence of urogenital implants: Secondary | ICD-10-CM | POA: Diagnosis not present

## 2021-08-11 DIAGNOSIS — G4733 Obstructive sleep apnea (adult) (pediatric): Secondary | ICD-10-CM | POA: Diagnosis not present

## 2021-08-11 DIAGNOSIS — I7121 Aneurysm of the ascending aorta, without rupture: Secondary | ICD-10-CM | POA: Diagnosis not present

## 2021-08-11 DIAGNOSIS — Z9181 History of falling: Secondary | ICD-10-CM | POA: Diagnosis not present

## 2021-08-11 DIAGNOSIS — Z8616 Personal history of COVID-19: Secondary | ICD-10-CM | POA: Diagnosis not present

## 2021-08-11 DIAGNOSIS — W19XXXD Unspecified fall, subsequent encounter: Secondary | ICD-10-CM | POA: Diagnosis not present

## 2021-08-11 DIAGNOSIS — I129 Hypertensive chronic kidney disease with stage 1 through stage 4 chronic kidney disease, or unspecified chronic kidney disease: Secondary | ICD-10-CM | POA: Diagnosis not present

## 2021-08-11 DIAGNOSIS — N132 Hydronephrosis with renal and ureteral calculous obstruction: Secondary | ICD-10-CM | POA: Diagnosis not present

## 2021-08-11 DIAGNOSIS — I69312 Visuospatial deficit and spatial neglect following cerebral infarction: Secondary | ICD-10-CM | POA: Diagnosis not present

## 2021-08-11 DIAGNOSIS — F32A Depression, unspecified: Secondary | ICD-10-CM | POA: Diagnosis not present

## 2021-08-11 DIAGNOSIS — Z87440 Personal history of urinary (tract) infections: Secondary | ICD-10-CM | POA: Diagnosis not present

## 2021-08-11 DIAGNOSIS — I69354 Hemiplegia and hemiparesis following cerebral infarction affecting left non-dominant side: Secondary | ICD-10-CM | POA: Diagnosis not present

## 2021-08-11 DIAGNOSIS — N1831 Chronic kidney disease, stage 3a: Secondary | ICD-10-CM | POA: Diagnosis not present

## 2021-08-11 DIAGNOSIS — N261 Atrophy of kidney (terminal): Secondary | ICD-10-CM | POA: Diagnosis not present

## 2021-08-11 DIAGNOSIS — Z936 Other artificial openings of urinary tract status: Secondary | ICD-10-CM | POA: Diagnosis not present

## 2021-08-15 ENCOUNTER — Encounter (HOSPITAL_BASED_OUTPATIENT_CLINIC_OR_DEPARTMENT_OTHER): Payer: Self-pay

## 2021-08-15 ENCOUNTER — Emergency Department (HOSPITAL_BASED_OUTPATIENT_CLINIC_OR_DEPARTMENT_OTHER): Payer: Medicare Other

## 2021-08-15 ENCOUNTER — Inpatient Hospital Stay (HOSPITAL_BASED_OUTPATIENT_CLINIC_OR_DEPARTMENT_OTHER)
Admission: EM | Admit: 2021-08-15 | Discharge: 2021-08-25 | DRG: 871 | Disposition: A | Discharge status: Home Health | Payer: Medicare Other | Attending: Internal Medicine | Admitting: Internal Medicine

## 2021-08-15 ENCOUNTER — Other Ambulatory Visit: Payer: Self-pay

## 2021-08-15 ENCOUNTER — Emergency Department (HOSPITAL_BASED_OUTPATIENT_CLINIC_OR_DEPARTMENT_OTHER): Payer: Medicare Other | Admitting: Radiology

## 2021-08-15 DIAGNOSIS — N200 Calculus of kidney: Secondary | ICD-10-CM

## 2021-08-15 DIAGNOSIS — Z8249 Family history of ischemic heart disease and other diseases of the circulatory system: Secondary | ICD-10-CM | POA: Diagnosis not present

## 2021-08-15 DIAGNOSIS — Z8679 Personal history of other diseases of the circulatory system: Secondary | ICD-10-CM

## 2021-08-15 DIAGNOSIS — Z23 Encounter for immunization: Secondary | ICD-10-CM

## 2021-08-15 DIAGNOSIS — N281 Cyst of kidney, acquired: Secondary | ICD-10-CM | POA: Diagnosis not present

## 2021-08-15 DIAGNOSIS — J9690 Respiratory failure, unspecified, unspecified whether with hypoxia or hypercapnia: Secondary | ICD-10-CM | POA: Diagnosis not present

## 2021-08-15 DIAGNOSIS — R1310 Dysphagia, unspecified: Secondary | ICD-10-CM | POA: Diagnosis present

## 2021-08-15 DIAGNOSIS — J690 Pneumonitis due to inhalation of food and vomit: Secondary | ICD-10-CM | POA: Diagnosis not present

## 2021-08-15 DIAGNOSIS — R109 Unspecified abdominal pain: Secondary | ICD-10-CM | POA: Diagnosis not present

## 2021-08-15 DIAGNOSIS — I517 Cardiomegaly: Secondary | ICD-10-CM | POA: Diagnosis not present

## 2021-08-15 DIAGNOSIS — G4733 Obstructive sleep apnea (adult) (pediatric): Secondary | ICD-10-CM | POA: Diagnosis not present

## 2021-08-15 DIAGNOSIS — R0602 Shortness of breath: Secondary | ICD-10-CM

## 2021-08-15 DIAGNOSIS — N210 Calculus in bladder: Secondary | ICD-10-CM | POA: Diagnosis not present

## 2021-08-15 DIAGNOSIS — A419 Sepsis, unspecified organism: Principal | ICD-10-CM

## 2021-08-15 DIAGNOSIS — N183 Chronic kidney disease, stage 3 unspecified: Secondary | ICD-10-CM | POA: Diagnosis present

## 2021-08-15 DIAGNOSIS — Z7982 Long term (current) use of aspirin: Secondary | ICD-10-CM | POA: Diagnosis not present

## 2021-08-15 DIAGNOSIS — N1832 Chronic kidney disease, stage 3b: Secondary | ICD-10-CM | POA: Diagnosis present

## 2021-08-15 DIAGNOSIS — E876 Hypokalemia: Secondary | ICD-10-CM | POA: Diagnosis present

## 2021-08-15 DIAGNOSIS — Z8673 Personal history of transient ischemic attack (TIA), and cerebral infarction without residual deficits: Secondary | ICD-10-CM | POA: Diagnosis not present

## 2021-08-15 DIAGNOSIS — Z20822 Contact with and (suspected) exposure to covid-19: Secondary | ICD-10-CM | POA: Diagnosis present

## 2021-08-15 DIAGNOSIS — Z6831 Body mass index (BMI) 31.0-31.9, adult: Secondary | ICD-10-CM | POA: Diagnosis not present

## 2021-08-15 DIAGNOSIS — Z79899 Other long term (current) drug therapy: Secondary | ICD-10-CM | POA: Diagnosis not present

## 2021-08-15 DIAGNOSIS — I10 Essential (primary) hypertension: Secondary | ICD-10-CM | POA: Diagnosis not present

## 2021-08-15 DIAGNOSIS — R6520 Severe sepsis without septic shock: Secondary | ICD-10-CM | POA: Diagnosis present

## 2021-08-15 DIAGNOSIS — R0902 Hypoxemia: Secondary | ICD-10-CM | POA: Diagnosis not present

## 2021-08-15 DIAGNOSIS — I129 Hypertensive chronic kidney disease with stage 1 through stage 4 chronic kidney disease, or unspecified chronic kidney disease: Secondary | ICD-10-CM | POA: Diagnosis present

## 2021-08-15 DIAGNOSIS — Z4659 Encounter for fitting and adjustment of other gastrointestinal appliance and device: Secondary | ICD-10-CM | POA: Diagnosis not present

## 2021-08-15 DIAGNOSIS — G319 Degenerative disease of nervous system, unspecified: Secondary | ICD-10-CM | POA: Diagnosis not present

## 2021-08-15 DIAGNOSIS — E870 Hyperosmolality and hypernatremia: Secondary | ICD-10-CM | POA: Diagnosis not present

## 2021-08-15 DIAGNOSIS — E440 Moderate protein-calorie malnutrition: Secondary | ICD-10-CM | POA: Diagnosis not present

## 2021-08-15 DIAGNOSIS — Z0189 Encounter for other specified special examinations: Secondary | ICD-10-CM

## 2021-08-15 DIAGNOSIS — N1831 Chronic kidney disease, stage 3a: Secondary | ICD-10-CM | POA: Diagnosis not present

## 2021-08-15 DIAGNOSIS — J029 Acute pharyngitis, unspecified: Secondary | ICD-10-CM

## 2021-08-15 DIAGNOSIS — Z4682 Encounter for fitting and adjustment of non-vascular catheter: Secondary | ICD-10-CM | POA: Diagnosis not present

## 2021-08-15 DIAGNOSIS — N202 Calculus of kidney with calculus of ureter: Secondary | ICD-10-CM | POA: Diagnosis not present

## 2021-08-15 DIAGNOSIS — J9811 Atelectasis: Secondary | ICD-10-CM | POA: Diagnosis not present

## 2021-08-15 DIAGNOSIS — Z66 Do not resuscitate: Secondary | ICD-10-CM | POA: Diagnosis not present

## 2021-08-15 DIAGNOSIS — T83022A Displacement of nephrostomy catheter, initial encounter: Secondary | ICD-10-CM | POA: Diagnosis not present

## 2021-08-15 DIAGNOSIS — J9601 Acute respiratory failure with hypoxia: Secondary | ICD-10-CM | POA: Diagnosis not present

## 2021-08-15 DIAGNOSIS — I69354 Hemiplegia and hemiparesis following cerebral infarction affecting left non-dominant side: Secondary | ICD-10-CM | POA: Diagnosis not present

## 2021-08-15 DIAGNOSIS — J392 Other diseases of pharynx: Secondary | ICD-10-CM | POA: Diagnosis not present

## 2021-08-15 DIAGNOSIS — R8271 Bacteriuria: Secondary | ICD-10-CM | POA: Diagnosis not present

## 2021-08-15 DIAGNOSIS — M47812 Spondylosis without myelopathy or radiculopathy, cervical region: Secondary | ICD-10-CM | POA: Diagnosis not present

## 2021-08-15 DIAGNOSIS — R000 Tachycardia, unspecified: Secondary | ICD-10-CM | POA: Diagnosis not present

## 2021-08-15 DIAGNOSIS — E669 Obesity, unspecified: Secondary | ICD-10-CM

## 2021-08-15 DIAGNOSIS — T17908A Unspecified foreign body in respiratory tract, part unspecified causing other injury, initial encounter: Secondary | ICD-10-CM

## 2021-08-15 DIAGNOSIS — J384 Edema of larynx: Secondary | ICD-10-CM | POA: Diagnosis not present

## 2021-08-15 DIAGNOSIS — K449 Diaphragmatic hernia without obstruction or gangrene: Secondary | ICD-10-CM | POA: Diagnosis not present

## 2021-08-15 DIAGNOSIS — N261 Atrophy of kidney (terminal): Secondary | ICD-10-CM | POA: Diagnosis not present

## 2021-08-15 DIAGNOSIS — N22 Calculus of urinary tract in diseases classified elsewhere: Secondary | ICD-10-CM | POA: Diagnosis not present

## 2021-08-15 HISTORY — DX: Pneumonitis due to inhalation of food and vomit: J69.0

## 2021-08-15 LAB — CBC WITH DIFFERENTIAL/PLATELET
Abs Immature Granulocytes: 0.07 10*3/uL (ref 0.00–0.07)
Basophils Absolute: 0 10*3/uL (ref 0.0–0.1)
Basophils Relative: 0 %
Eosinophils Absolute: 0 10*3/uL (ref 0.0–0.5)
Eosinophils Relative: 0 %
HCT: 45.8 % (ref 39.0–52.0)
Hemoglobin: 14.8 g/dL (ref 13.0–17.0)
Immature Granulocytes: 0 %
Lymphocytes Relative: 5 %
Lymphs Abs: 0.8 10*3/uL (ref 0.7–4.0)
MCH: 27.2 pg (ref 26.0–34.0)
MCHC: 32.3 g/dL (ref 30.0–36.0)
MCV: 84.2 fL (ref 80.0–100.0)
Monocytes Absolute: 0.7 10*3/uL (ref 0.1–1.0)
Monocytes Relative: 5 %
Neutro Abs: 14.4 10*3/uL — ABNORMAL HIGH (ref 1.7–7.7)
Neutrophils Relative %: 90 %
Platelets: 196 10*3/uL (ref 150–400)
RBC: 5.44 MIL/uL (ref 4.22–5.81)
RDW: 14.5 % (ref 11.5–15.5)
WBC: 16.1 10*3/uL — ABNORMAL HIGH (ref 4.0–10.5)
nRBC: 0 % (ref 0.0–0.2)

## 2021-08-15 LAB — COMPREHENSIVE METABOLIC PANEL
ALT: 15 U/L (ref 0–44)
AST: 18 U/L (ref 15–41)
Albumin: 4.7 g/dL (ref 3.5–5.0)
Alkaline Phosphatase: 57 U/L (ref 38–126)
Anion gap: 14 (ref 5–15)
BUN: 23 mg/dL (ref 8–23)
CO2: 26 mmol/L (ref 22–32)
Calcium: 10.1 mg/dL (ref 8.9–10.3)
Chloride: 101 mmol/L (ref 98–111)
Creatinine, Ser: 1.4 mg/dL — ABNORMAL HIGH (ref 0.61–1.24)
GFR, Estimated: 53 mL/min — ABNORMAL LOW (ref >60)
Glucose, Bld: 120 mg/dL — ABNORMAL HIGH (ref 70–99)
Potassium: 3.3 mmol/L — ABNORMAL LOW (ref 3.5–5.1)
Sodium: 141 mmol/L (ref 135–145)
Total Bilirubin: 0.5 mg/dL (ref 0.3–1.2)
Total Protein: 7.6 g/dL (ref 6.5–8.1)

## 2021-08-15 LAB — BRAIN NATRIURETIC PEPTIDE: B Natriuretic Peptide: 24.1 pg/mL (ref 0.0–100.0)

## 2021-08-15 LAB — TROPONIN I (HIGH SENSITIVITY)
Troponin I (High Sensitivity): 7 ng/L (ref <18)
Troponin I (High Sensitivity): 8 ng/L (ref <18)

## 2021-08-15 LAB — RESP PANEL BY RT-PCR (FLU A&B, COVID) ARPGX2
Influenza A by PCR: NEGATIVE
Influenza B by PCR: NEGATIVE
SARS Coronavirus 2 by RT PCR: NEGATIVE

## 2021-08-15 IMAGING — CT CT ANGIO CHEST
2 of 7 series · 17 of 46 positions shown · IV contrast (agent unspecified)
Comparison: Chest x-ray [DATE], CT abdomen pelvis [DATE]

CLINICAL DATA: Tachycardia

EXAM:
CT ANGIOGRAPHY CHEST WITH CONTRAST
TECHNIQUE: Multidetector CT imaging of the chest was performed using the
standard protocol during bolus administration of intravenous
contrast. Multiplanar CT image reconstructions and MIPs were
obtained to evaluate the vascular anatomy.

[Series 5: pe axial thins · axial · 0.98mm/px · z∈[+1122,+1417]mm · 14 of 341 slices shown]
[im 23/341  lung]
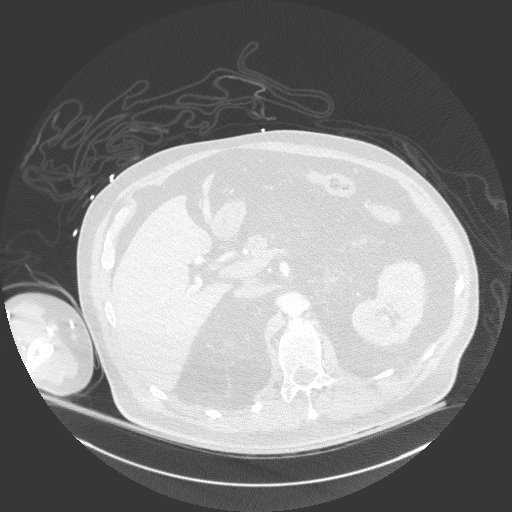
[im 46/341  soft-tissue]
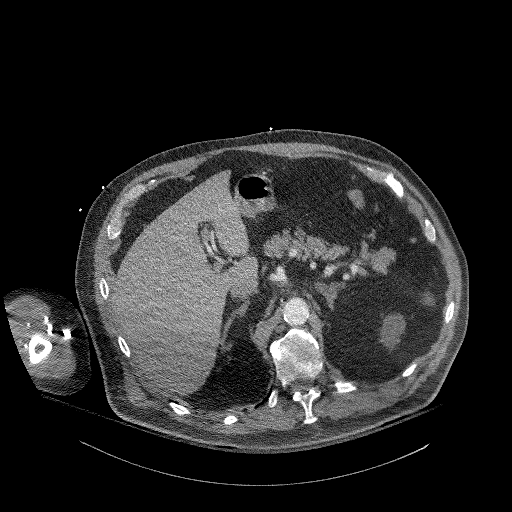
[im 69/341  lung]
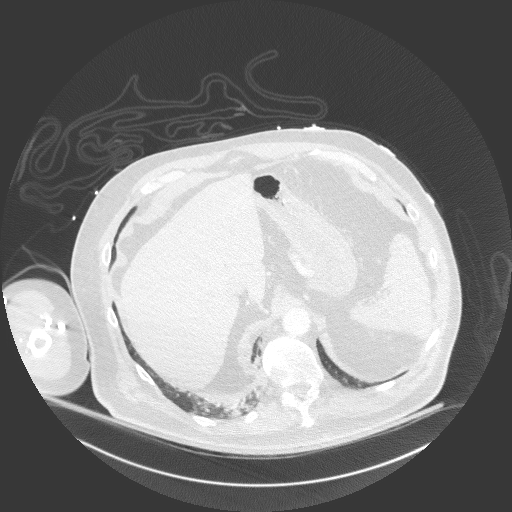
[im 91/341  soft-tissue]
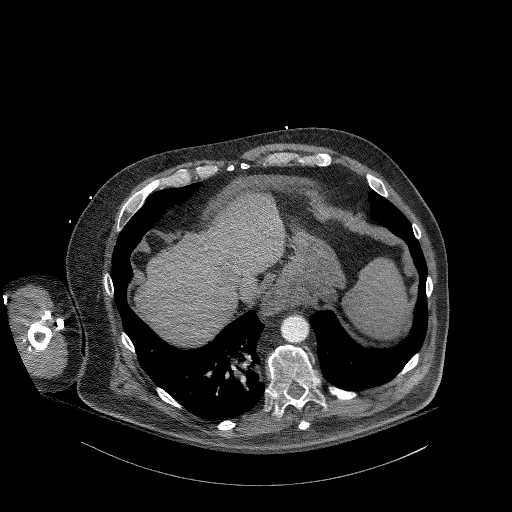
[im 114/341  lung]
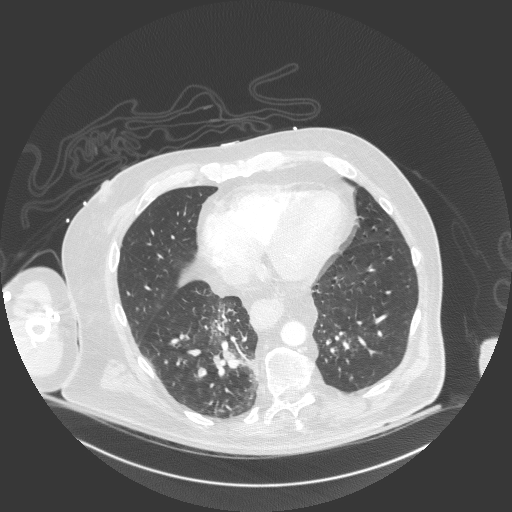
[im 137/341  soft-tissue]
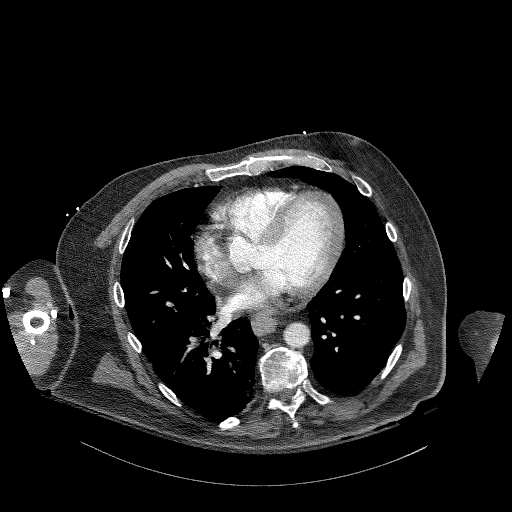
[im 159/341  lung]
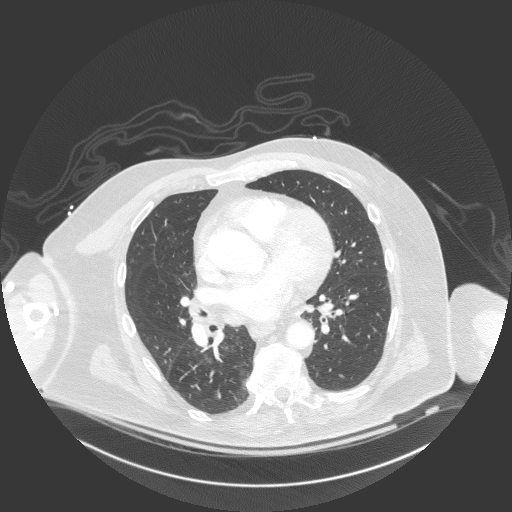
[im 182/341  soft-tissue]
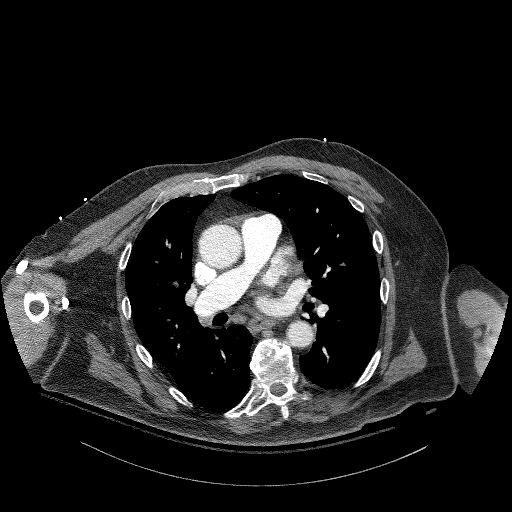
[im 205/341  lung]
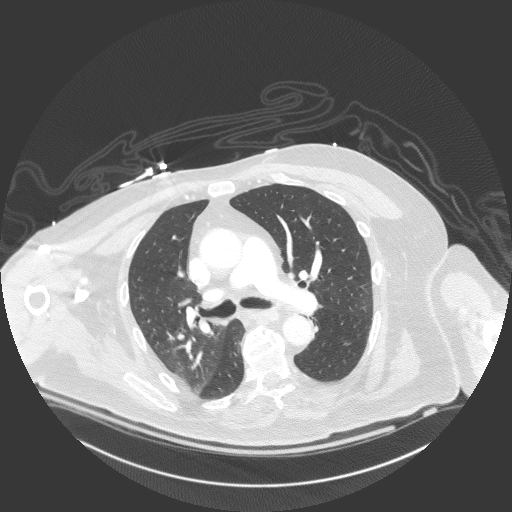
[im 227/341  soft-tissue]
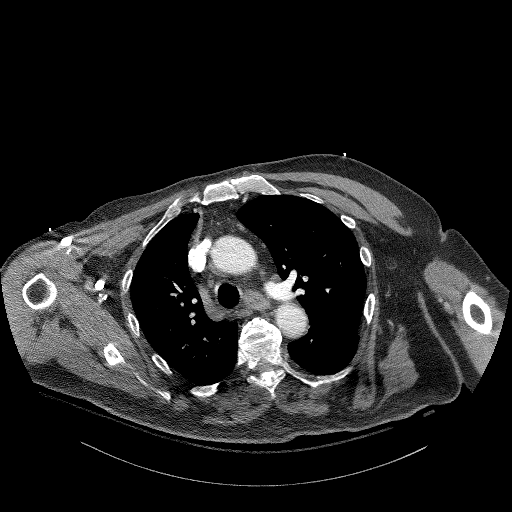
[im 250/341  lung]
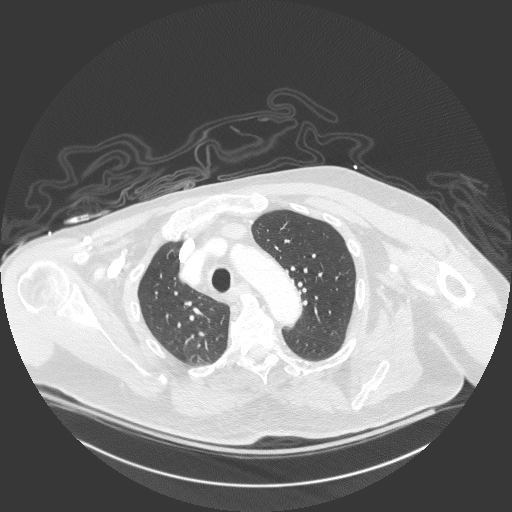
[im 273/341  soft-tissue]
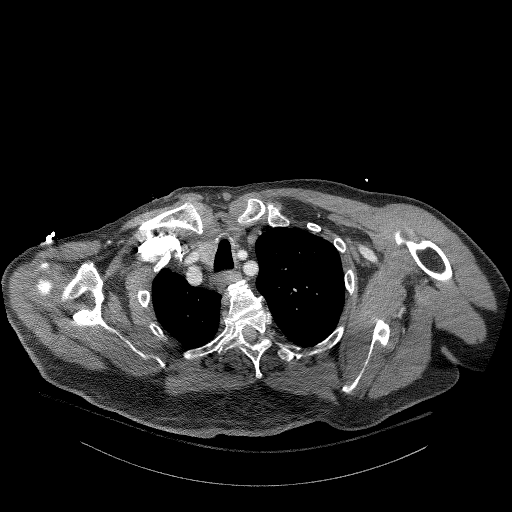
[im 295/341  lung]
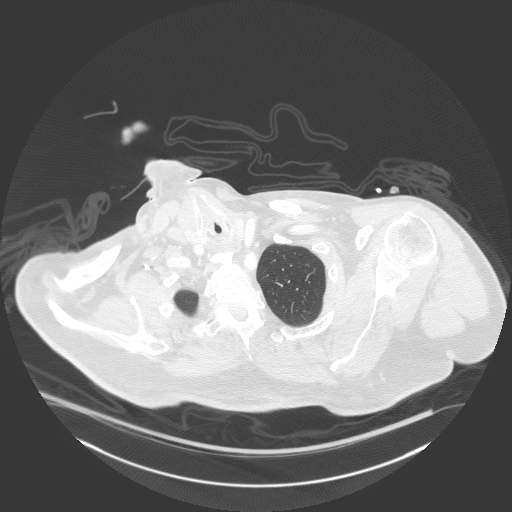
[im 318/341  soft-tissue]
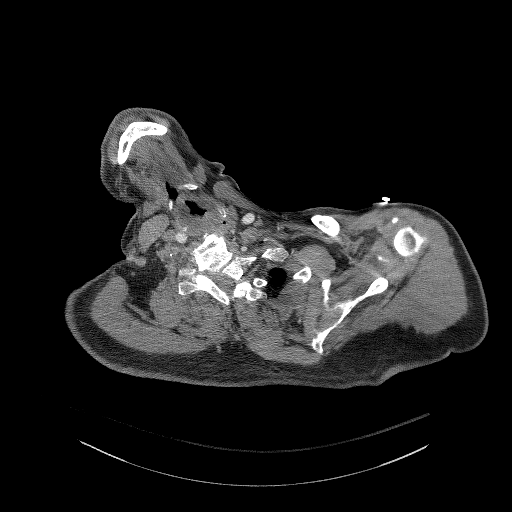

[Series 7: cor soft · coronal · 0.66mm/px · 3 of 178 slices shown]
[im 45/178  soft-tissue]
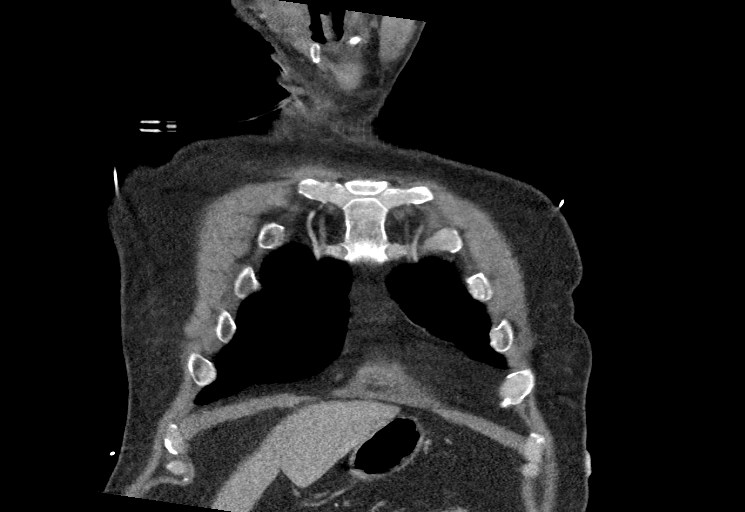
[im 89/178  soft-tissue]
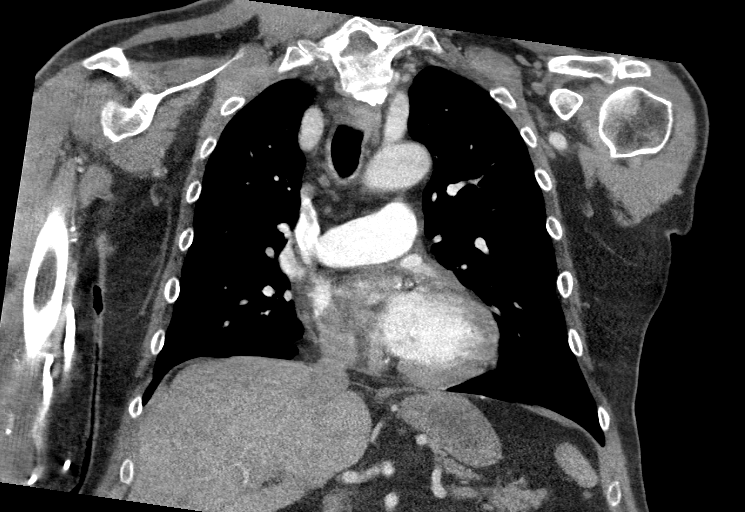
[im 133/178  soft-tissue]
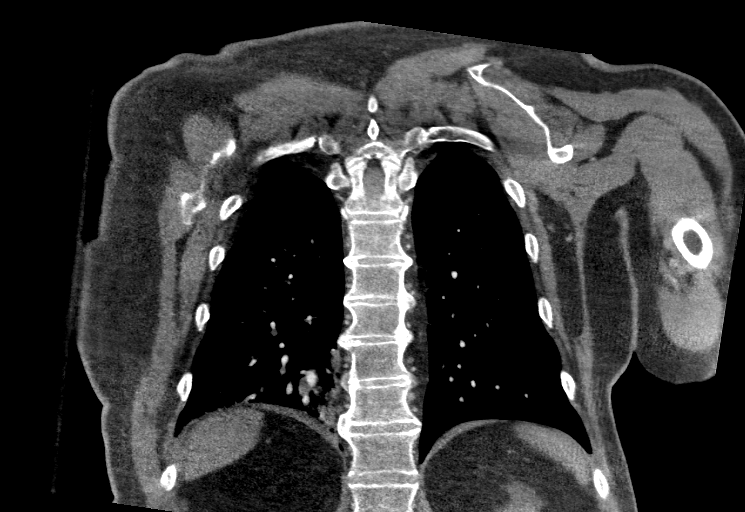

[17 of 46 positions shown; findings below may reference images not displayed]

RADIATION DOSE REDUCTION: This exam was performed according to the
departmental dose-optimization program which includes automated
exposure control, adjustment of the mA and/or kV according to
patient size and/or use of iterative reconstruction technique.

CONTRAST:  75mL OMNIPAQUE IOHEXOL 350 MG/ML SOLN
FINDINGS: Cardiovascular: Satisfactory opacification of the pulmonary arteries
to the segmental level. No evidence of pulmonary embolism. Mild
aortic atherosclerosis. No aneurysm. Normal cardiac size. No
pericardial effusion

Mediastinum/Nodes: Midline trachea. No suspicious thyroid nodule. No
suspicious lymph nodes. Small hiatal hernia.

Lungs/Pleura: No pleural effusion or pneumothorax. Mild nodularity
and ground-glass density in the right lower lobe with debris within
right lower lobe bronchi.

Upper Abdomen: Partially visualized atrophic right kidney. Cysts
upper pole left kidney

Musculoskeletal: No acute osseous abnormality.

Review of the MIP images confirms the above findings.
IMPRESSION: 1. Negative for acute pulmonary embolus.
2. Interval finding of mildly nodular and ground-glass airspace
disease in the right lung base with debris and or fluid within the
bronchi, given clear lung bases on the CT performed earlier today,
suspect that findings could be secondary to aspiration.

Aortic Atherosclerosis ([0R]-[0R]).

## 2021-08-15 IMAGING — DX DG CHEST 1V PORT
1 series · 2 of 2 positions shown · non-contrast
Comparison: Two-view chest x-ray [DATE]

CLINICAL DATA: Shortness of breath.

EXAM:
PORTABLE CHEST 1 VIEW

[Series 1: chest ap · 0.14mm/px · 2 of 2 slices shown]
[im 1/2]
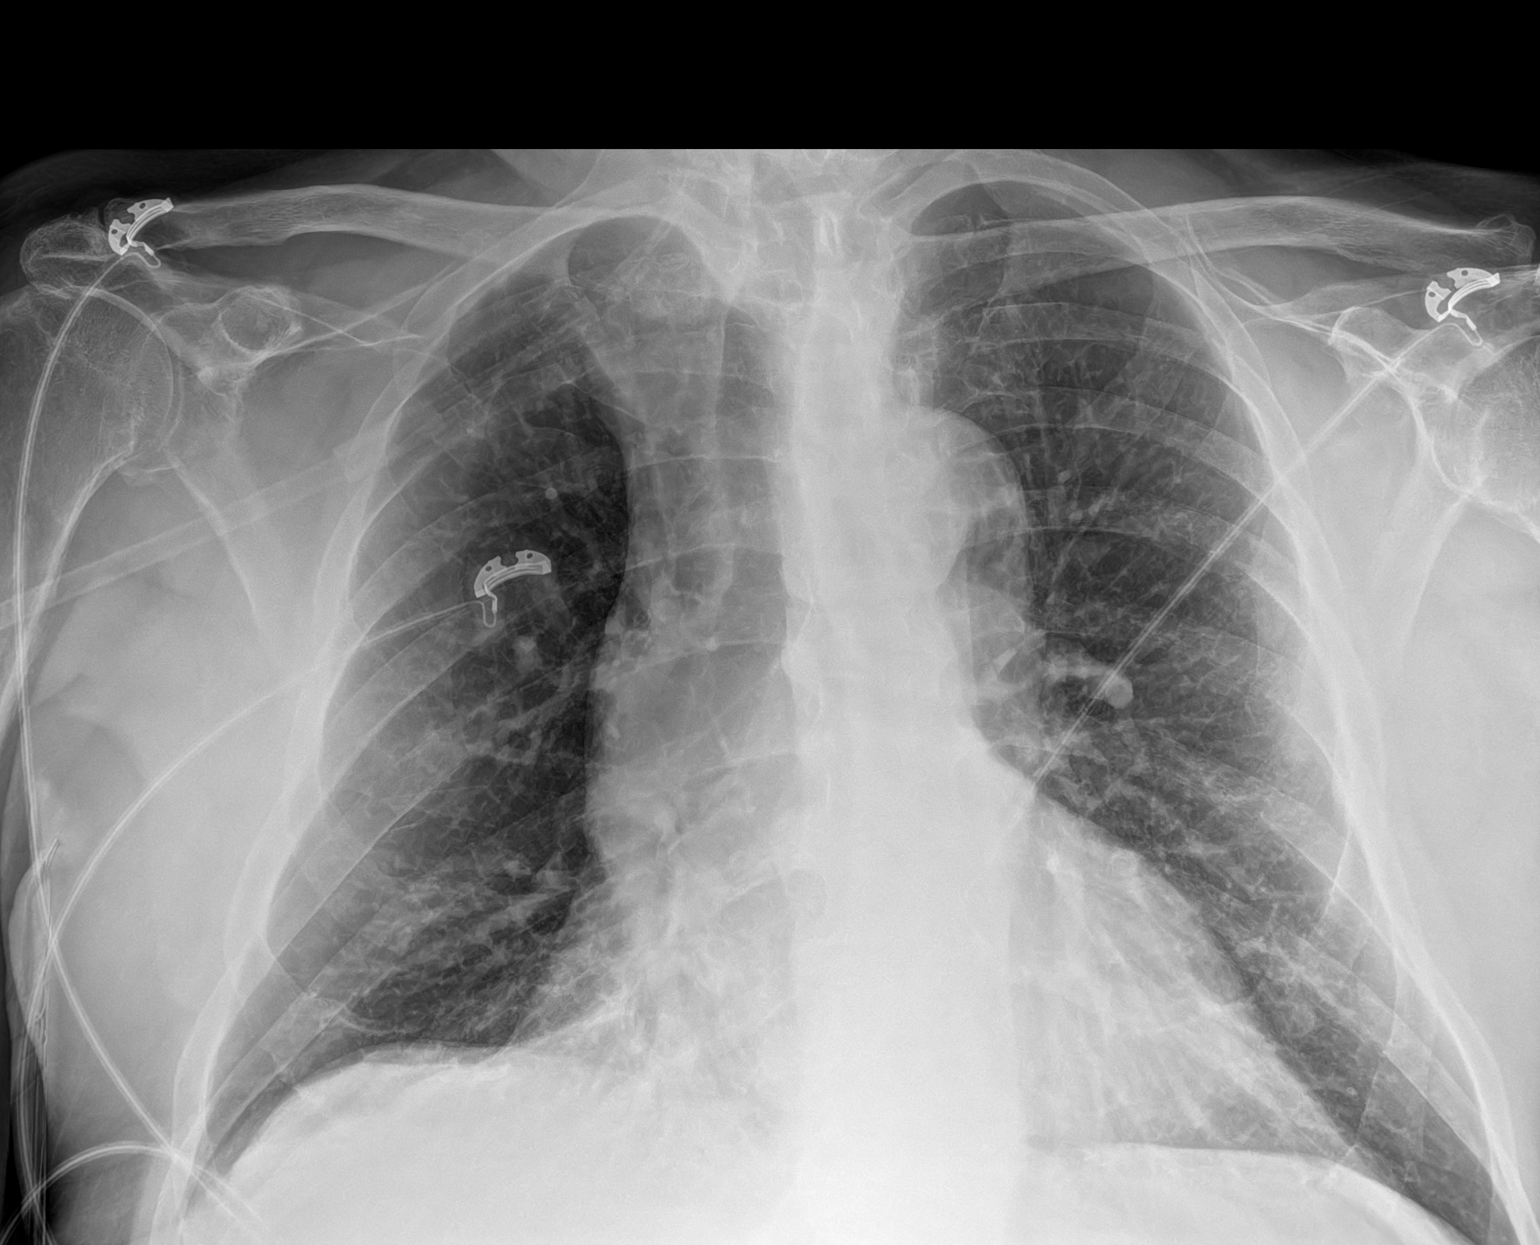
[im 2/2]
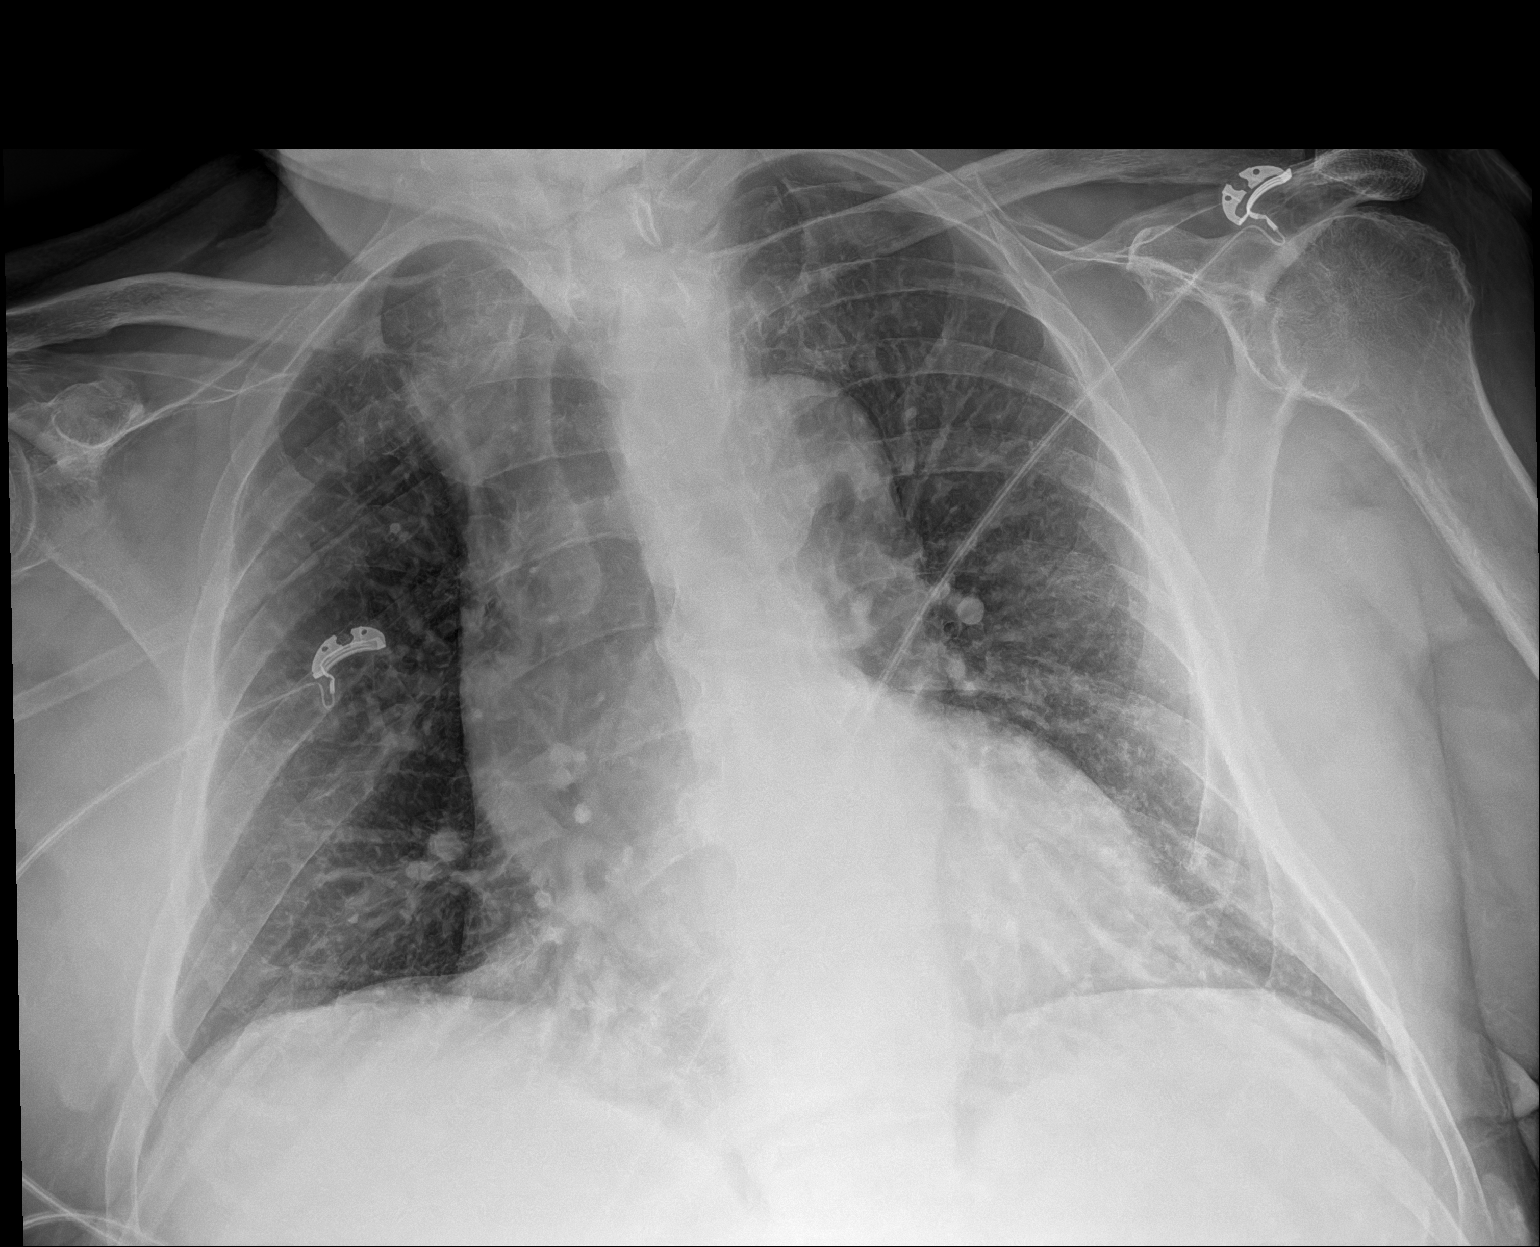

[2 of 2 positions shown; findings below may reference images not displayed]

FINDINGS: Heart size is normal. Atherosclerotic changes are again seen at the
aortic arch. Chronic interstitial markings present. No edema or
effusion is present. No focal airspace disease is present.
Degenerative changes are noted in the shoulders.
IMPRESSION: No acute cardiopulmonary disease or significant interval change.

## 2021-08-15 MED ORDER — ACETAMINOPHEN 650 MG RE SUPP
650.0000 mg | Freq: Once | RECTAL | Status: AC
Start: 2021-08-15 — End: 2021-08-15
  Administered 2021-08-15: 650 mg via RECTAL
  Filled 2021-08-15: qty 1

## 2021-08-15 MED ORDER — AMPICILLIN-SULBACTAM SODIUM 3 (2-1) G IJ SOLR
3.0000 g | Freq: Once | INTRAMUSCULAR | Status: AC
  Administered 2021-08-15: 3 g via INTRAVENOUS
  Filled 2021-08-15: qty 8

## 2021-08-15 MED ORDER — ONDANSETRON HCL 4 MG/2ML IJ SOLN
4.0000 mg | Freq: Once | INTRAMUSCULAR | Status: AC
  Administered 2021-08-15: 4 mg via INTRAVENOUS
  Filled 2021-08-15: qty 2

## 2021-08-15 MED ORDER — IOHEXOL 350 MG/ML SOLN
75.0000 mL | Freq: Once | INTRAVENOUS | Status: AC | PRN
  Administered 2021-08-15: 75 mL via INTRAVENOUS

## 2021-08-15 MED ORDER — LACTATED RINGERS IV BOLUS
1000.0000 mL | Freq: Once | INTRAVENOUS | Status: AC
Start: 2021-08-15 — End: 2021-08-15
  Administered 2021-08-15: 1000 mL via INTRAVENOUS

## 2021-08-15 MED ORDER — IOHEXOL 350 MG/ML SOLN: 100.0000 mL | Freq: Once | INTRAVENOUS | Status: DC | PRN

## 2021-08-15 NOTE — ED Notes (Signed)
RT in Triage Room

## 2021-08-15 NOTE — Plan of Care (Signed)
76 yo M, h/o stroke.  Aspirated after taking pill without water today it seems / when laying flat at CT earlier today for urology -> became sick.  Now fever in ED, N/V -> 13L O2 requirement.  Second CT today shows aspiration findings.  Will put in SDU / progressive.  Was DNR in Dec.  [facility] will assume care on arrival to accepting facility. Until arrival, care as per EDP. However, [facility] available 24/7 for questions and assistance.  Nursing staff, please page [facility] Admits and Consults [phone]) as soon as the patient arrives to the hospital.

## 2021-08-15 NOTE — ED Triage Notes (Signed)
Patient here POV from Home with Multiple Complaints.  Patient endorses CKD and had CT Scan performed today for Assessment. Patient has been NPO for CT Scan and swallowed a Pill today at 1000 without Water.   Since then the patient has become Hypertensive, tachycardic, congested, hypoxic, and uncomfortable.  Patient appears uncomfortable in Triage. A&Ox4. GCS 15. BIB Wheelchair.

## 2021-08-15 NOTE — ED Notes (Signed)
Due to patient's SpO2 maintaining 89-90% on 6LNC, patient placed on salter at 13L/min. Will titrate as able. RN aware.

## 2021-08-15 NOTE — ED Provider Notes (Signed)
[facility] EMERGENCY DEPT  Provider Note  CSN: [id number] Arrival date & time: 08/15/21 1536  History Chief Complaint  Patient presents with   Shortness of Breath    Nicholas Mckenzie is a 76 y.o. 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 Wife at bedside supplements history. He had a CT done at [facility] today as part of a plan for eventual nephrectomy. He was NPO prior to the scan but decided to take a Prozac pill before going to scan but did not use and water and felt like he choked on the pill. He was having some mild trouble breathing and elevated BP there but proceeded with the CT anyway and then went home, took his Norvasc and drank some beet juice. He was having more trouble breathing as the day went on so wife brought him to the ED where he was noted to be hypertensive, tachycardic and hypoxic. He was improved some with supplemental O2 placed prior to my arrival to the exam room. Shortly after I arrived to bedside he began vomiting, pink colored stomach contents. Sat up, suctioned and cleaned up. Not having chest pain but feels a scratchy feeling in his throat. He is unsure if he might have choked on some secretions while getting his CT as well.    Home Medications Prior to Admission medications   Medication Sig Start Date End Date Taking? Authorizing Provider  amLODipine (NORVASC) 5 MG tablet Take 1 tablet (5 mg total) by mouth in the morning and at bedtime. 06/08/21   Hongalgi, Maximino Greenland, MD  aspirin EC 81 MG tablet Take 1 tablet (81 mg total) by mouth daily. Swallow whole. 07/19/21   Ihor Austin, NP  FLUoxetine (PROZAC) 40 MG capsule Take 40 mg by mouth daily.    [provider]  lactobacillus (FLORANEX/LACTINEX) PACK Take 1 packet (1 g total) by mouth 2 (two) times daily. 04/13/21   Lanae Boast, MD  melatonin 3 MG TABS tablet Take 3 mg by mouth at bedtime.    [provider]  Multiple Vitamins-Minerals (MULTIVITAMIN WITH MINERALS) tablet Take 1 tablet by mouth daily.    [provider]     Allergies    Statins   Review of Systems   Review of Systems Please see HPI for pertinent positives and negatives  Physical Exam BP (!) 164/89    Pulse (!) 106    Temp (!) 101.9 F (38.8 C) (Oral)    Resp 18    Ht 5\' 8"  (1.727 m)    Wt 90.7 kg    SpO2 95%    BMI 30.40 kg/m   Physical Exam Vitals and nursing note reviewed.  Constitutional:      Appearance: Normal appearance.  HENT:     Head: Normocephalic and atraumatic.     Nose: Nose normal.     Mouth/Throat:     Mouth: Mucous membranes are moist.  Eyes:     Extraocular Movements: Extraocular movements intact.     Conjunctiva/sclera: Conjunctivae normal.  Cardiovascular:     Rate and Rhythm: Tachycardia present.  Pulmonary:     Effort: Pulmonary effort is normal. Tachypnea present.     Breath sounds: Rhonchi present.  Abdominal:     General: Abdomen is flat.     Palpations: Abdomen is soft.     Tenderness: There is no abdominal tenderness.     Comments: Nephrostomy in  R flank, draining urine  Musculoskeletal:        General: No swelling. Normal range of motion.     Cervical back: Neck supple.     Right lower leg: No edema.     Left lower leg: No edema.  Skin:    General: Skin is warm and dry.  Neurological:     General: No focal deficit present.     Mental Status: He is alert.  Psychiatric:        Mood and Affect: Mood normal.    ED Results / Procedures / Treatments   EKG EKG Interpretation  Date/Time:  Tuesday August 15 2021 15:54:26 EST Ventricular Rate:  113 PR Interval:  196 QRS Duration: 70 QT Interval:  306 QTC Calculation: 419 R Axis:   29 Text Interpretation: Sinus tachycardia Anterior infarct , age undetermined Abnormal ECG When compared with ECG of 06-Jun-2021 14:50, Rate faster Confirmed by Susy Frizzle [phone]) on 08/15/2021 5:19:50  PM  Procedures .Critical Care Performed by: Pollyann Savoy, MD Authorized by: Pollyann Savoy, MD   Critical care provider statement:    Critical care time (minutes):  75   Critical care time was exclusive of:  Separately billable procedures and treating other patients   Critical care was necessary to treat or prevent imminent or life-threatening deterioration of the following conditions:  Respiratory failure   Critical care was time spent personally by me on the following activities:  Development of treatment plan with patient or surrogate, discussions with consultants, evaluation of patient's response to treatment, examination of patient, ordering and review of laboratory studies, ordering and review of radiographic studies, ordering and performing treatments and interventions, pulse oximetry, re-evaluation of patient's condition and review of old charts   Care discussed with: admitting provider    Medications Ordered in the ED Medications  Ampicillin-Sulbactam (UNASYN) 3 g in sodium chloride 0.9 % 100 mL IVPB (has no administration in time range)  ondansetron (ZOFRAN) injection 4 mg (4 mg Intravenous Given 08/15/21 1725)  lactated ringers bolus 1,000 mL (0 mLs Intravenous Stopped 08/15/21 1913)  iohexol (OMNIPAQUE) 350 MG/ML injection 75 mL (75 mLs Intravenous Contrast Given 08/15/21 1831)    Initial Impression and Plan  Patient with SOB, tachycardia and hypoxia after possible choking on pill this morning. I reviewed his CT with Dr. Berneice Heinrich, Urology, no concerning findings at the time the images were obtained. I also spoke with Radiologist who reports no contraindication to additional contrast load to eval PE vs aspiration on a CT chest. Currently sinus tachy on the monitor.   ED Course   Clinical Course as of 08/15/21 1945  Tue Aug 15, 2021  1410 I personally viewed the images from radiology studies and agree with radiologist interpretation: CXR is clear  [CS]  1809 CBC shows a  leukocytosis. 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  [CS]  1838 Covid is neg. Trop and BNP neg [CS]  1903 I personally viewed the images from radiology studies and agree with radiologist interpretation: CTA without PE but there is signs of aspiration. Patient maintaining SpO2 mid-low 90s on 4L [place], appears more comfortable. Will discuss admission with hospitalist. [CS]  1924 Patient is now febrile, will begin Abx for aspiration pneumonia.  [CS]  [id number] Spoke with Dr. Julian Reil, Hospitalist, who will accept for admission. Patient is now on HFNC with improved oxygenation. Feeling better, he confirms prior DNR/DNI is still applicable.  [CS]    Clinical Course User Index [CS] Pollyann Savoy, MD  MDM Rules/Calculators/A&P Medical Decision Making Problems Addressed: Acute respiratory failure with hypoxia [facility]): acute illness or injury that poses a threat to life or bodily functions Aspiration pneumonia of right lower lobe, unspecified aspiration pneumonia type [facility]): acute illness or injury that poses a threat to life or bodily functions  Amount and/or Complexity of Data Reviewed Labs: ordered. Decision-making details documented in ED Course. Radiology: ordered and independent interpretation performed. Decision-making details documented in ED Course. ECG/medicine tests: ordered and independent interpretation performed. Decision-making details documented in ED Course.  Risk Prescription drug management. Decision regarding hospitalization.  Critical Care Total time providing critical care: 75-105 minutes   Final Clinical Impression(s) / ED Diagnoses Final diagnoses:  Aspiration pneumonia of right lower lobe, unspecified aspiration pneumonia type (HCC)  Acute respiratory failure with hypoxia [facility])    Rx / DC Orders ED Discharge Orders     None        Pollyann Savoy, MD 08/15/21 1946

## 2021-08-15 NOTE — ED Notes (Signed)
Patient experienced another episode of emesis.

## 2021-08-16 ENCOUNTER — Inpatient Hospital Stay (HOSPITAL_COMMUNITY): Payer: Medicare Other

## 2021-08-16 ENCOUNTER — Encounter (HOSPITAL_COMMUNITY): Payer: Self-pay | Admitting: Internal Medicine

## 2021-08-16 DIAGNOSIS — I10 Essential (primary) hypertension: Secondary | ICD-10-CM | POA: Diagnosis not present

## 2021-08-16 DIAGNOSIS — E669 Obesity, unspecified: Secondary | ICD-10-CM

## 2021-08-16 DIAGNOSIS — J029 Acute pharyngitis, unspecified: Secondary | ICD-10-CM

## 2021-08-16 DIAGNOSIS — N1831 Chronic kidney disease, stage 3a: Secondary | ICD-10-CM

## 2021-08-16 DIAGNOSIS — G4733 Obstructive sleep apnea (adult) (pediatric): Secondary | ICD-10-CM

## 2021-08-16 DIAGNOSIS — Z8673 Personal history of transient ischemic attack (TIA), and cerebral infarction without residual deficits: Secondary | ICD-10-CM

## 2021-08-16 DIAGNOSIS — J9601 Acute respiratory failure with hypoxia: Secondary | ICD-10-CM | POA: Diagnosis not present

## 2021-08-16 DIAGNOSIS — A419 Sepsis, unspecified organism: Principal | ICD-10-CM

## 2021-08-16 DIAGNOSIS — N200 Calculus of kidney: Secondary | ICD-10-CM

## 2021-08-16 DIAGNOSIS — J690 Pneumonitis due to inhalation of food and vomit: Secondary | ICD-10-CM | POA: Diagnosis not present

## 2021-08-16 DIAGNOSIS — Z8679 Personal history of other diseases of the circulatory system: Secondary | ICD-10-CM

## 2021-08-16 DIAGNOSIS — R6520 Severe sepsis without septic shock: Secondary | ICD-10-CM

## 2021-08-16 HISTORY — DX: Severe sepsis without septic shock: A41.9

## 2021-08-16 HISTORY — DX: Acute respiratory failure with hypoxia: J96.01

## 2021-08-16 LAB — CBC WITH DIFFERENTIAL/PLATELET
Abs Immature Granulocytes: 0.04 10*3/uL (ref 0.00–0.07)
Basophils Absolute: 0 10*3/uL (ref 0.0–0.1)
Basophils Relative: 0 %
Eosinophils Absolute: 0 10*3/uL (ref 0.0–0.5)
Eosinophils Relative: 0 %
HCT: 40.8 % (ref 39.0–52.0)
Hemoglobin: 13.8 g/dL (ref 13.0–17.0)
Immature Granulocytes: 0 %
Lymphocytes Relative: 8 %
Lymphs Abs: 1.2 10*3/uL (ref 0.7–4.0)
MCH: 28.6 pg (ref 26.0–34.0)
MCHC: 33.8 g/dL (ref 30.0–36.0)
MCV: 84.5 fL (ref 80.0–100.0)
Monocytes Absolute: 0.7 10*3/uL (ref 0.1–1.0)
Monocytes Relative: 5 %
Neutro Abs: 12.3 10*3/uL — ABNORMAL HIGH (ref 1.7–7.7)
Neutrophils Relative %: 87 %
Platelets: 209 10*3/uL (ref 150–400)
RBC: 4.83 MIL/uL (ref 4.22–5.81)
RDW: 14.5 % (ref 11.5–15.5)
WBC: 14.2 10*3/uL — ABNORMAL HIGH (ref 4.0–10.5)
nRBC: 0 % (ref 0.0–0.2)

## 2021-08-16 LAB — COMPREHENSIVE METABOLIC PANEL
ALT: 14 U/L (ref 0–44)
AST: 14 U/L — ABNORMAL LOW (ref 15–41)
Albumin: 3.4 g/dL — ABNORMAL LOW (ref 3.5–5.0)
Alkaline Phosphatase: 50 U/L (ref 38–126)
Anion gap: 11 (ref 5–15)
BUN: 17 mg/dL (ref 8–23)
CO2: 28 mmol/L (ref 22–32)
Calcium: 9 mg/dL (ref 8.9–10.3)
Chloride: 104 mmol/L (ref 98–111)
Creatinine, Ser: 1.58 mg/dL — ABNORMAL HIGH (ref 0.61–1.24)
GFR, Estimated: 46 mL/min — ABNORMAL LOW (ref >60)
Glucose, Bld: 120 mg/dL — ABNORMAL HIGH (ref 70–99)
Potassium: 2.9 mmol/L — ABNORMAL LOW (ref 3.5–5.1)
Sodium: 143 mmol/L (ref 135–145)
Total Bilirubin: 0.8 mg/dL (ref 0.3–1.2)
Total Protein: 6.2 g/dL — ABNORMAL LOW (ref 6.5–8.1)

## 2021-08-16 LAB — PHOSPHORUS: Phosphorus: 4.4 mg/dL (ref 2.5–4.6)

## 2021-08-16 LAB — HIV ANTIBODY (ROUTINE TESTING W REFLEX): HIV Screen 4th Generation wRfx: NONREACTIVE

## 2021-08-16 LAB — MAGNESIUM: Magnesium: 1.9 mg/dL (ref 1.7–2.4)

## 2021-08-16 IMAGING — MR MR HEAD W/O CM
12 of 13 series · 44 of 48 positions shown · non-contrast
Comparison: CT head [DATE].  MRI head [DATE]

CLINICAL DATA: Acute neuro deficit.  Rule out stroke

EXAM:
MRI HEAD WITHOUT CONTRAST
TECHNIQUE: Multiplanar, multiecho pulse sequences of the brain and surrounding
structures were obtained without intravenous contrast.

[Series 5: DWI · axial · 3.0mm · 0.88mm/px · z∈[-31,+117]mm · 9 of 104 slices shown (1 of 4)]
[im 1/104]
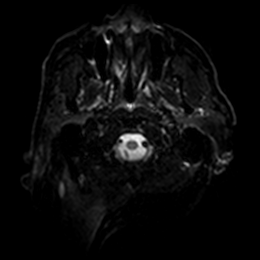
[im 13/104]
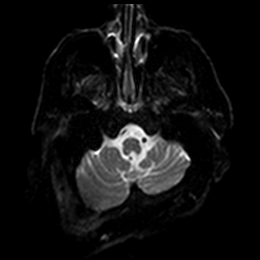
[im 26/104]
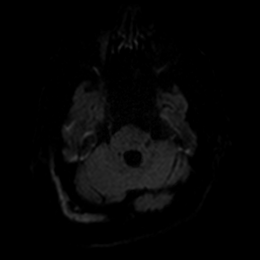
[im 39/104]
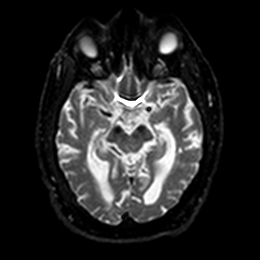
[im 52/104]
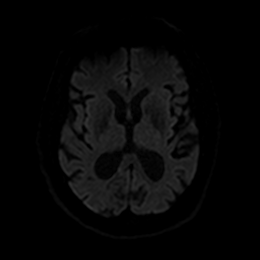
[im 65/104]
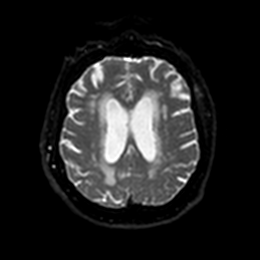
[im 78/104]
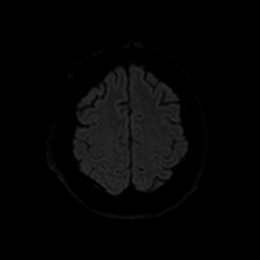
[im 91/104]
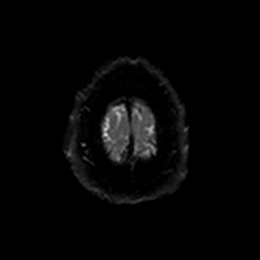
[im 104/104]
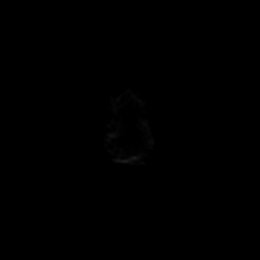

[Series 6: DWI · axial · 3.0mm · 0.88mm/px · z∈[-31,+117]mm · 4 of 52 slices shown (2 of 4)]
[im 1/52]
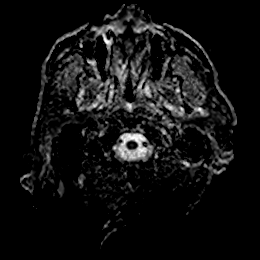
[im 18/52]
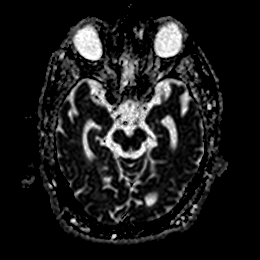
[im 35/52]
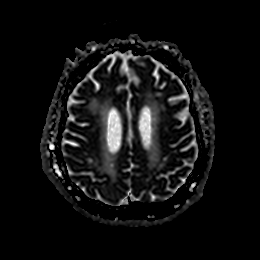
[im 52/52]
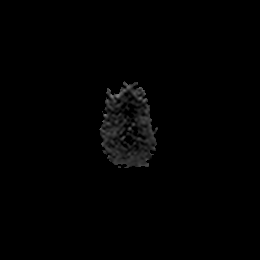

[Series 7: DWI · coronal · 4.0mm · 0.88mm/px · 5 of 64 slices shown (3 of 4)]
[im 1/64]
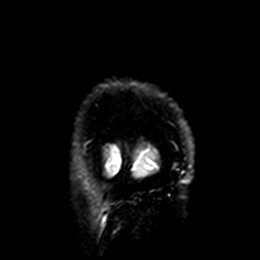
[im 16/64]
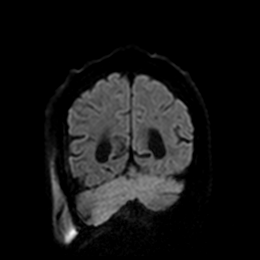
[im 32/64]
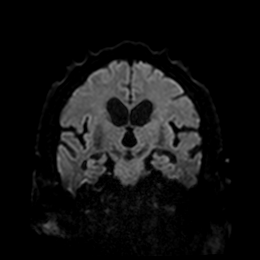
[im 48/64]
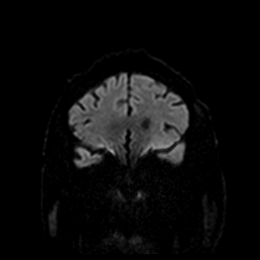
[im 64/64]
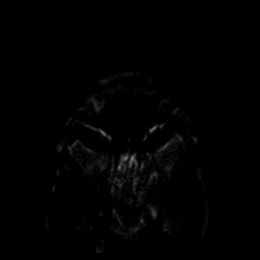

[Series 8: DWI · coronal · 4.0mm · 0.88mm/px · 2 of 32 slices shown (4 of 4)]
[im 1/32]
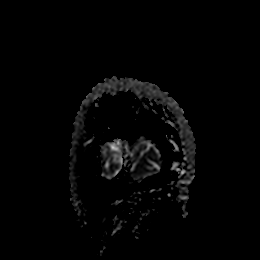
[im 32/32]
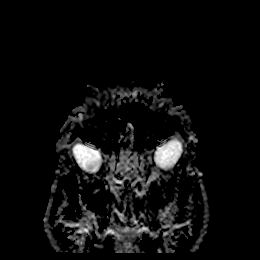

[Series 9: T1 · sagittal · 5.0mm · 0.75mm/px · 2 of 23 slices shown]
[im 1/23]
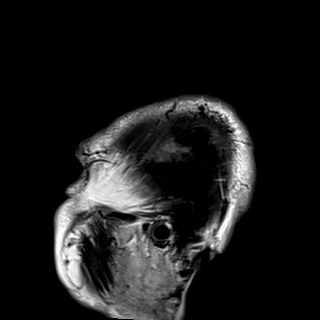
[im 23/23]
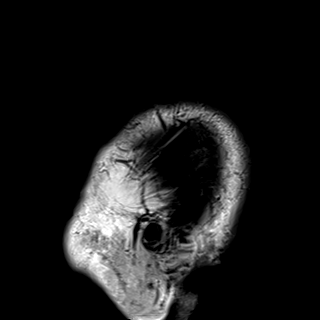

[Series 10: T2 · axial · 5.0mm · 0.72mm/px · z∈[-37,+105]mm · 2 of 25 slices shown (1 of 2)]
[im 1/25]
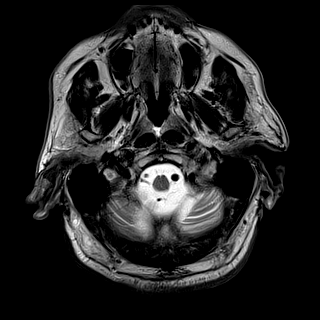
[im 25/25]
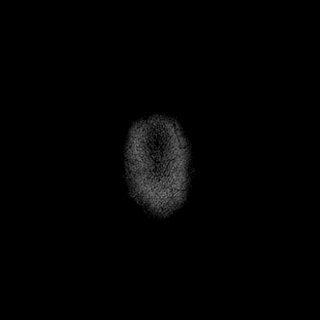

[Series 11: FLAIR · axial · 5.0mm · 0.45mm/px · z∈[-37,+105]mm · 2 of 25 slices shown]
[im 1/25]
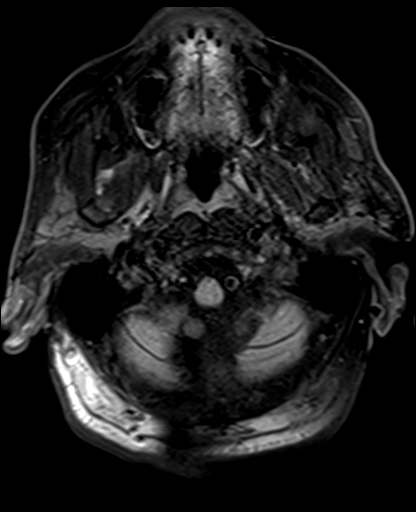
[im 25/25]
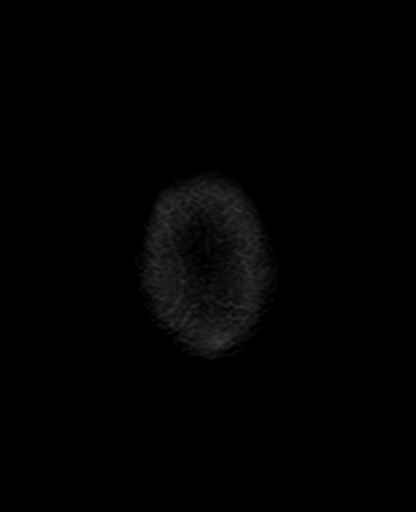

[Series 12: mag_images · axial · 3.0mm · 0.90mm/px · z∈[-35,+134]mm · 4 of 60 slices shown]
[im 1/60]
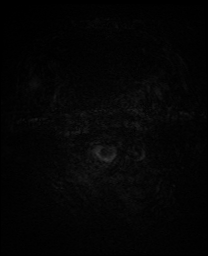
[im 20/60]
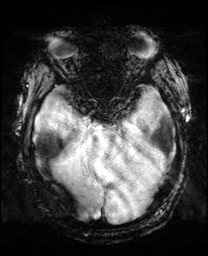
[im 40/60]
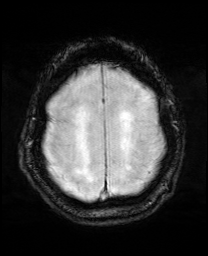
[im 60/60]
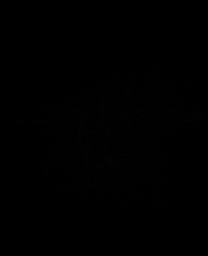

[Series 13: pha_images · axial · 3.0mm · 0.90mm/px · z∈[-35,+120]mm · 4 of 55 slices shown]
[im 1/55]
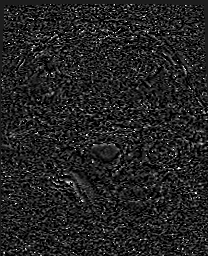
[im 19/55]
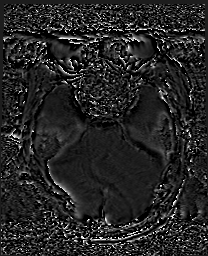
[im 37/55]
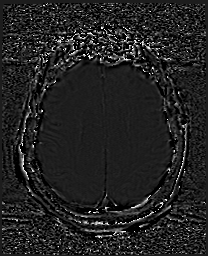
[im 55/55]
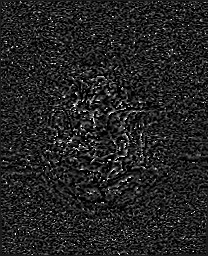

[Series 14: swi_images · axial · 3.0mm · 0.90mm/px · z∈[-35,+134]mm · 4 of 60 slices shown]
[im 1/60]
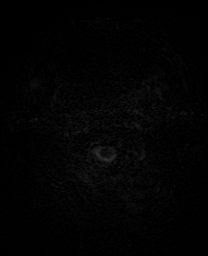
[im 20/60]
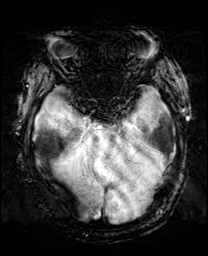
[im 40/60]
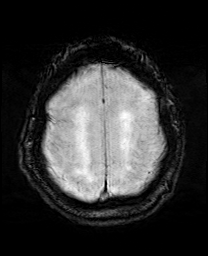
[im 60/60]
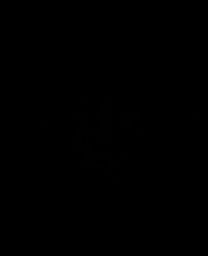

[Series 15: mip_images(sw) · axial · 24.0mm · 0.90mm/px · z∈[-25,+124]mm · 4 of 53 slices shown]
[im 1/53]
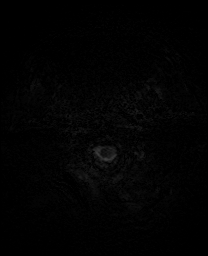
[im 18/53]
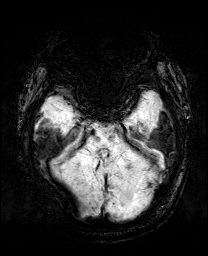
[im 35/53]
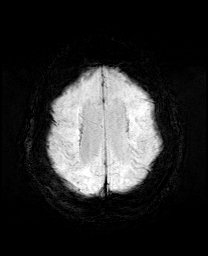
[im 53/53]
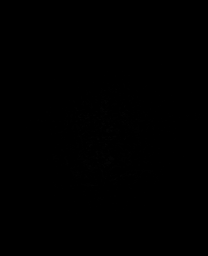

[Series 17: T2 · coronal · 5.0mm · 0.34mm/px · 2 of 30 slices shown (2 of 2)]
[im 1/30]
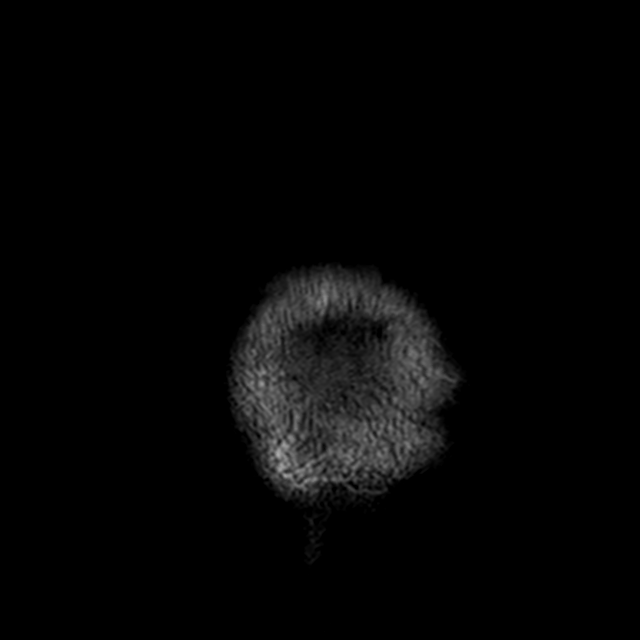
[im 30/30]
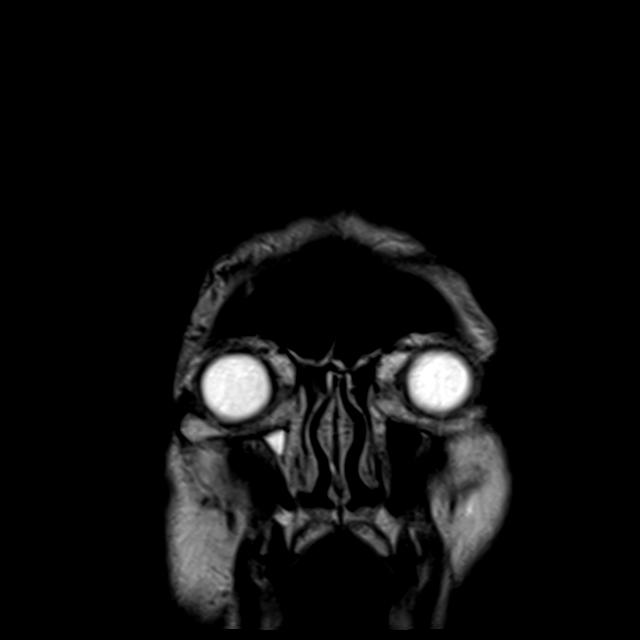

[44 of 48 positions shown; findings below may reference images not displayed]

FINDINGS: Brain: Negative for acute infarct.

Chronic infarct right occipital lobe. Chronic microvascular ischemic
change in the white matter. Chronic infarct in the pons and left
middle cerebellar peduncle. Negative for hemorrhage or mass.
Generalized atrophy.

Vascular: Normal arterial flow voids.

Skull and upper cervical spine: No focal skeletal lesion.

Sinuses/Orbits: Mild mucosal edema paranasal sinuses. Bilateral
cataract extraction

Other: None
IMPRESSION: Atrophy and chronic ischemic change.

Negative for acute infarct.

## 2021-08-16 IMAGING — DX DG ABD PORTABLE 1V
1 series · 1 of 1 positions shown · non-contrast
Comparison: None.

CLINICAL DATA: Feeding tube placement.

EXAM:
PORTABLE ABDOMEN - 1 VIEW

[abdomen]
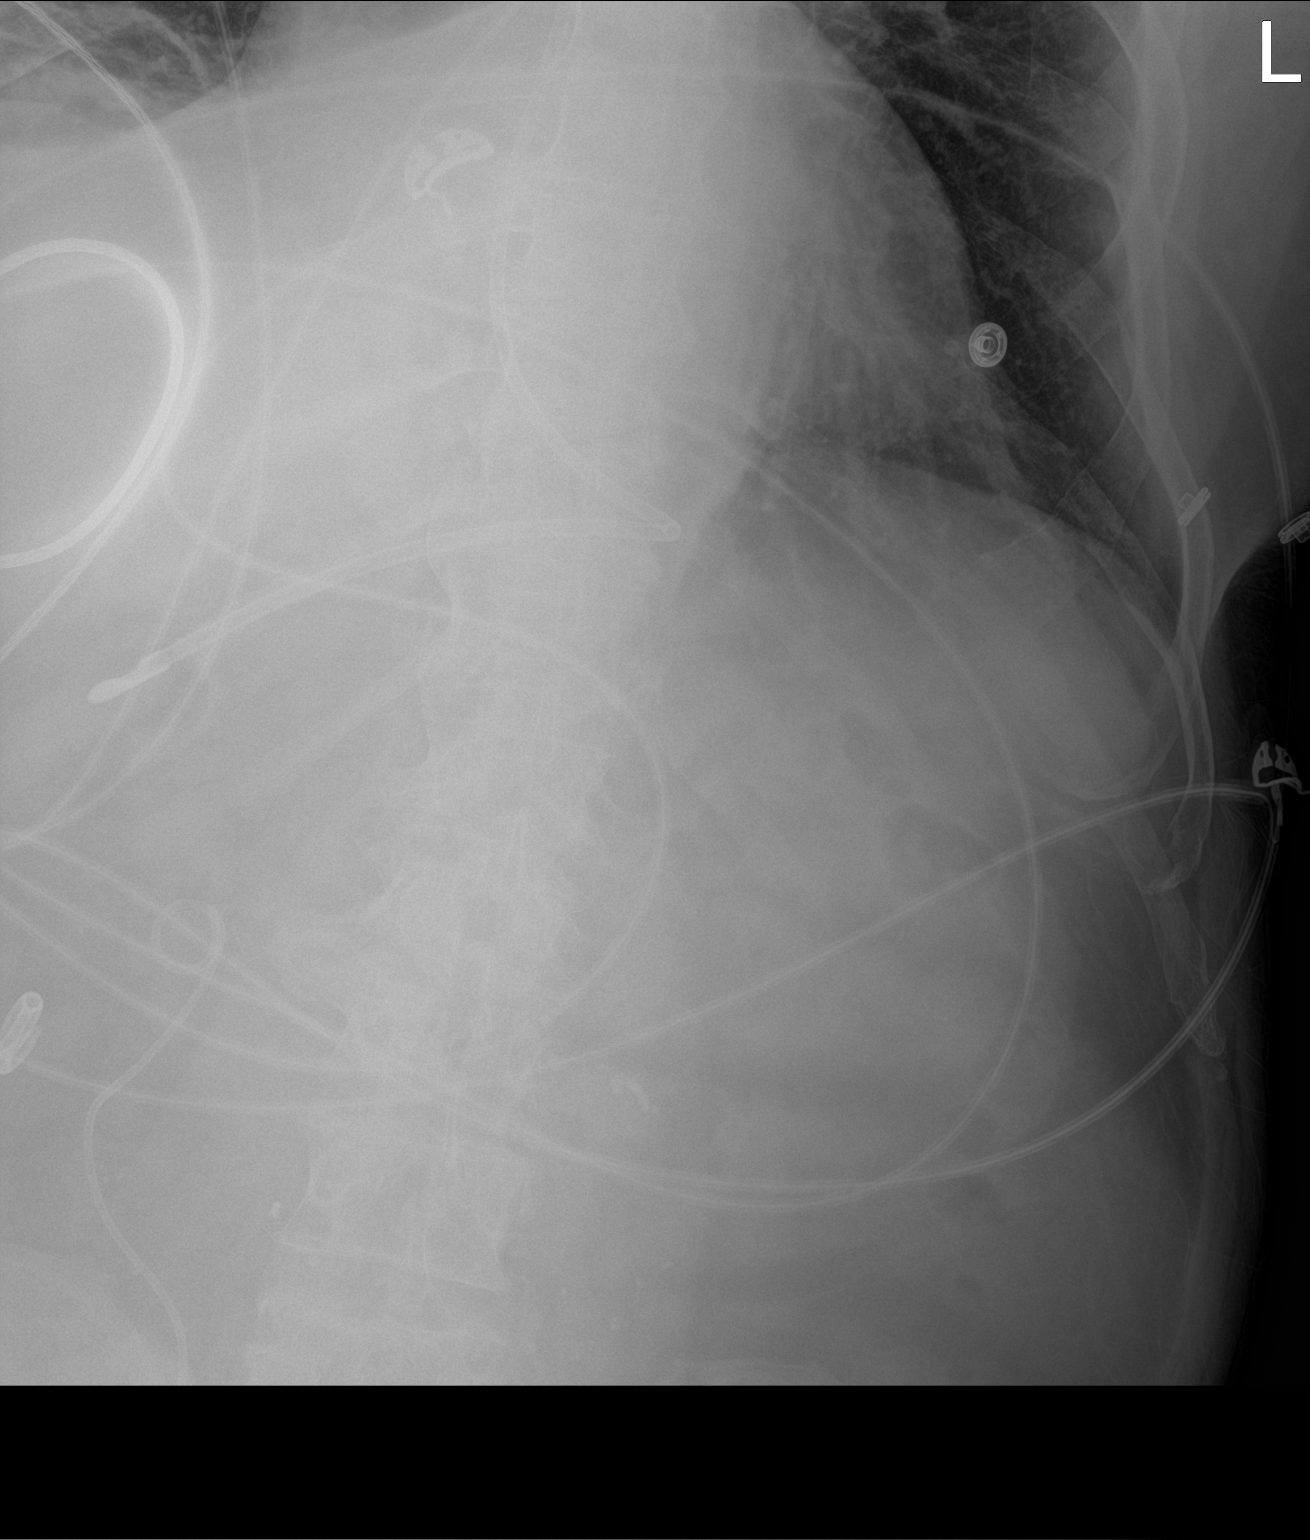

[1 of 1 positions shown; findings below may reference images not displayed]

FINDINGS: Feeding tube terminates near the pylorus. Visualized left lung base
is clear.
IMPRESSION: Pain tube terminates near the pylorus.

## 2021-08-16 IMAGING — CT CT NECK W/O CM
3 of 4 series · 13 of 34 positions shown, 16 images · IV contrast (Omni 300)
Comparison: [DATE] CTA of the neck

CLINICAL DATA: Possible aspiration swallowing a pill. Epiglottitis
or tonsillitis suspected

EXAM:
CT NECK WITHOUT CONTRAST
TECHNIQUE: Multidetector CT imaging of the neck was performed following the
standard protocol without intravenous contrast.
RADIATION DOSE REDUCTION: This exam was performed according to the
departmental dose-optimization program which includes automated
exposure control, adjustment of the mA and/or kV according to
patient size and/or use of iterative reconstruction technique.

[Series 5: sagittal w/o · sagittal · non-contrast · 0.53mm/px · 5 of 101 slices shown, 6 images]
[im 34/101  bone]
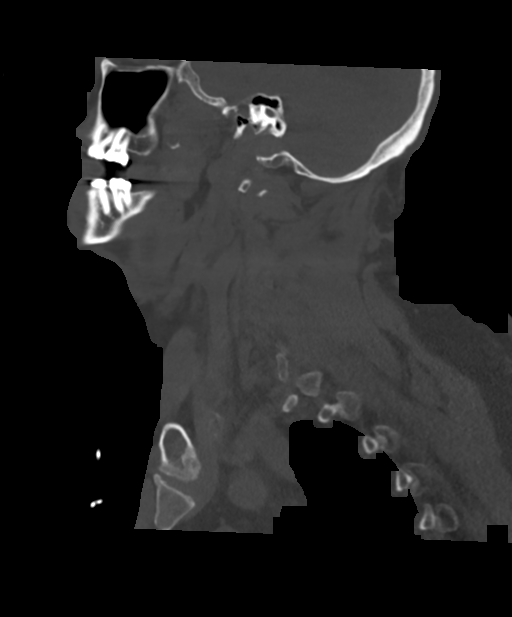
[im 42/101  bone]
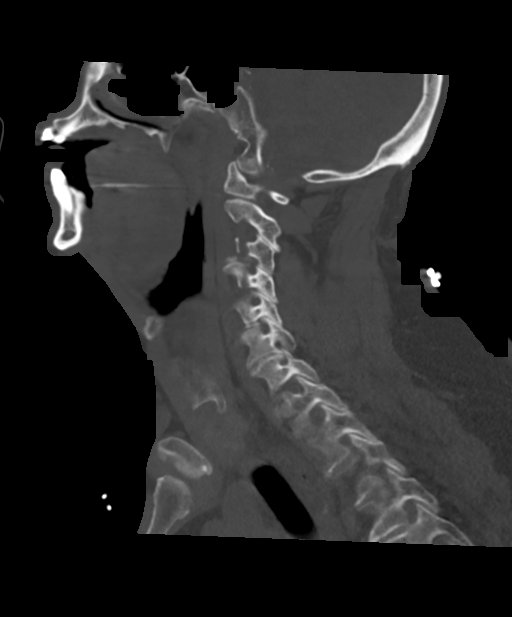
[im 51/101  soft-tissue]
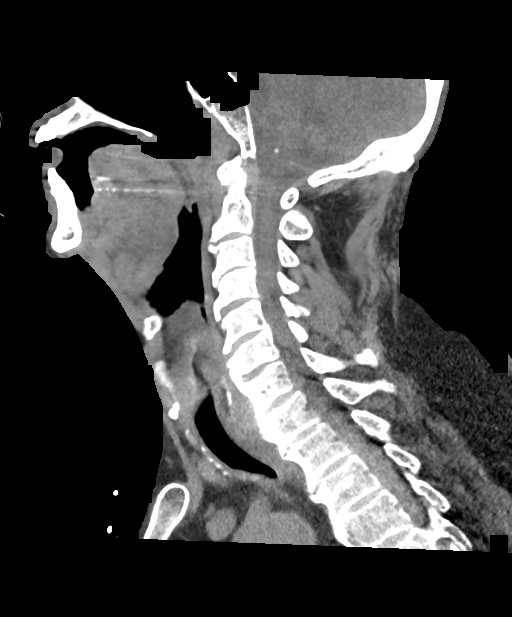
[im 51/101  bone]
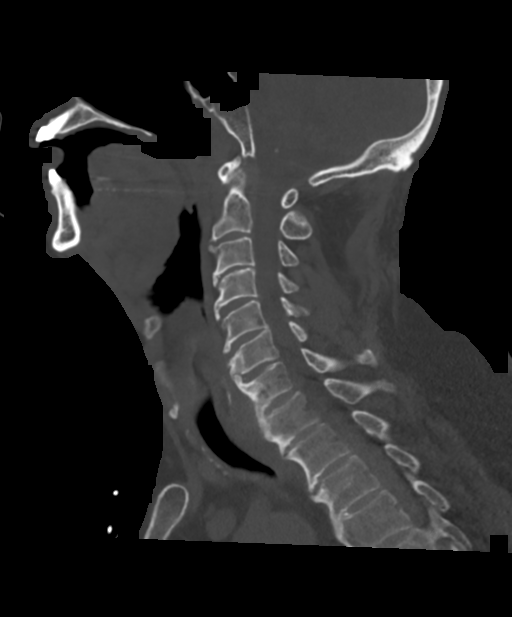
[im 59/101  bone]
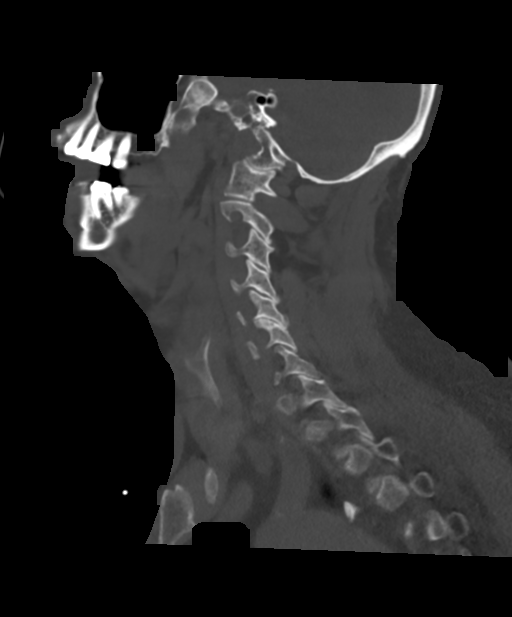
[im 67/101  bone]
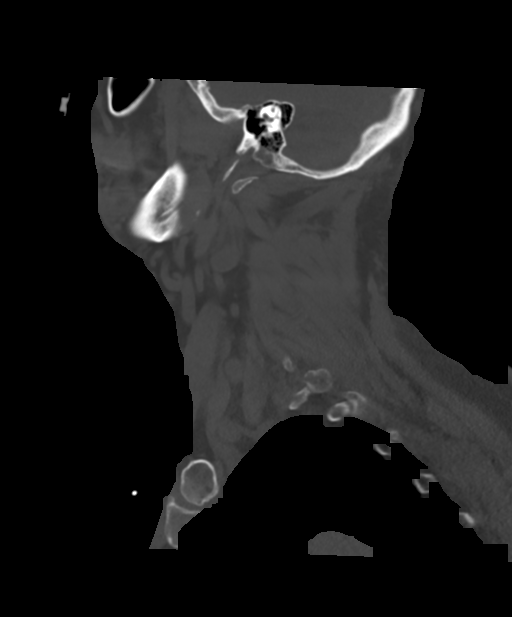

[Series 6: coronal w/o · coronal · non-contrast · 0.53mm/px · 3 of 115 slices shown]
[im 23/115  bone]
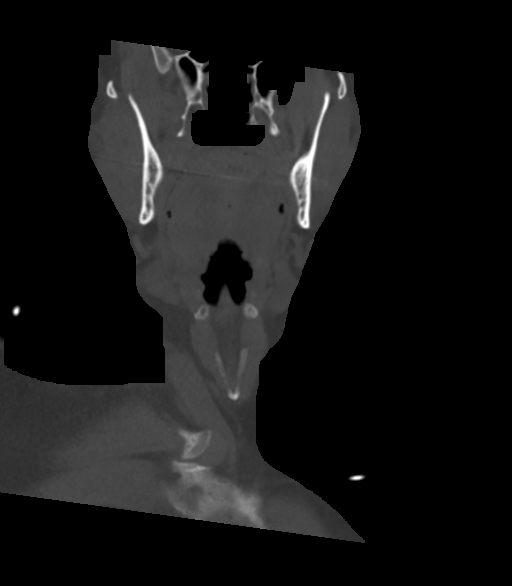
[im 46/115  bone]
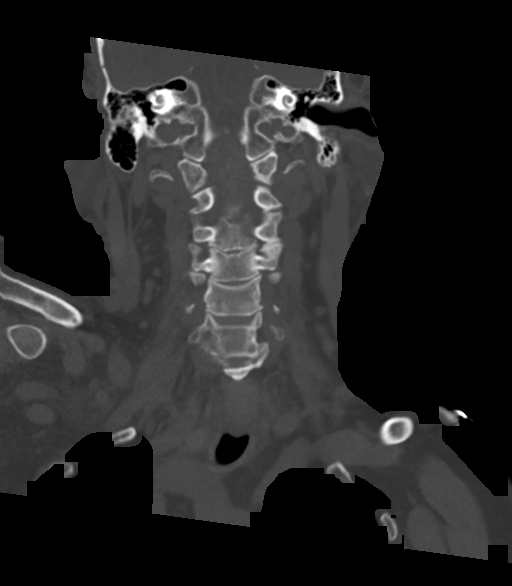
[im 69/115  bone]
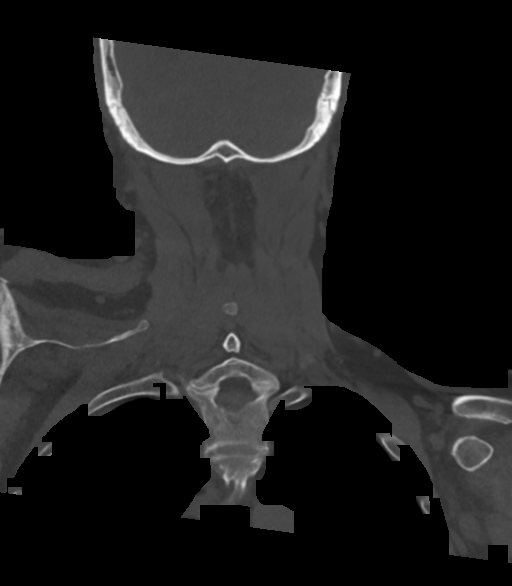

[Series 7: orthogonal · axial · 0.39mm/px · z∈[-358,-176]mm · 5 of 138 slices shown, 7 images]
[im 23/138  soft-tissue]
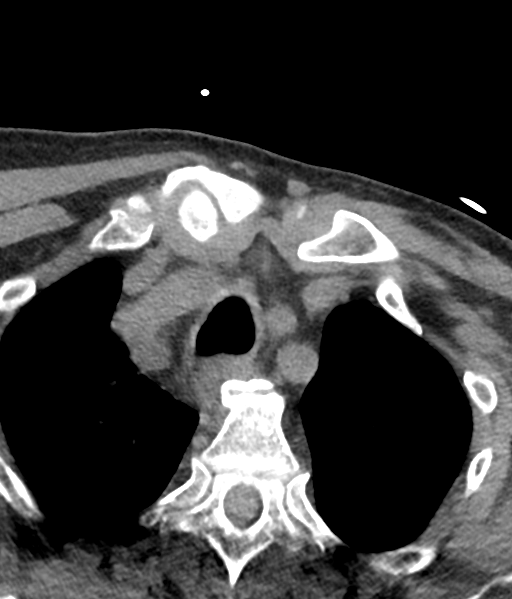
[im 23/138  bone]
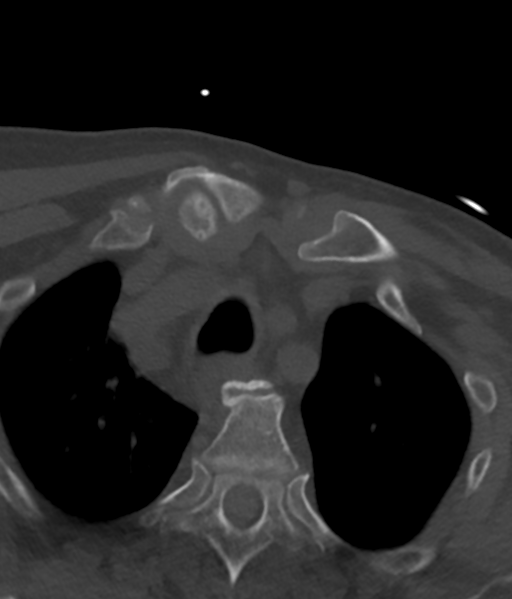
[im 46/138  bone]
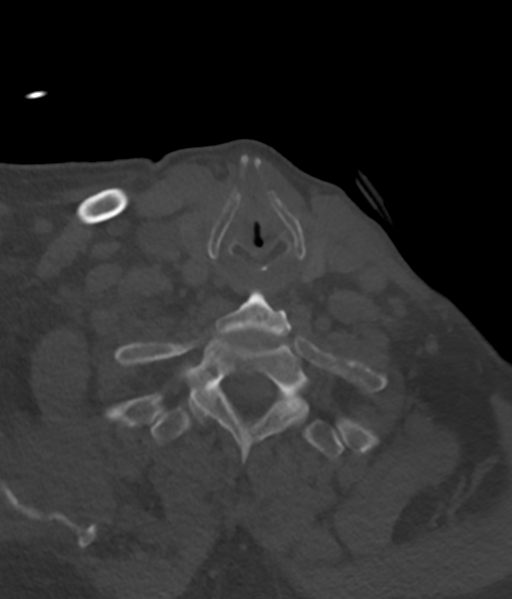
[im 69/138  bone]
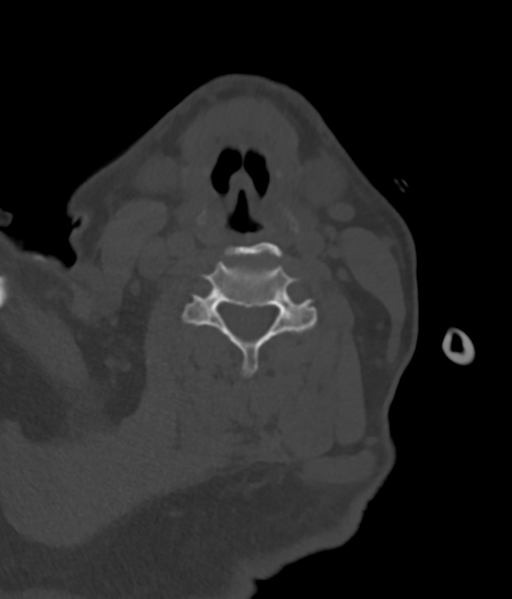
[im 92/138  bone]
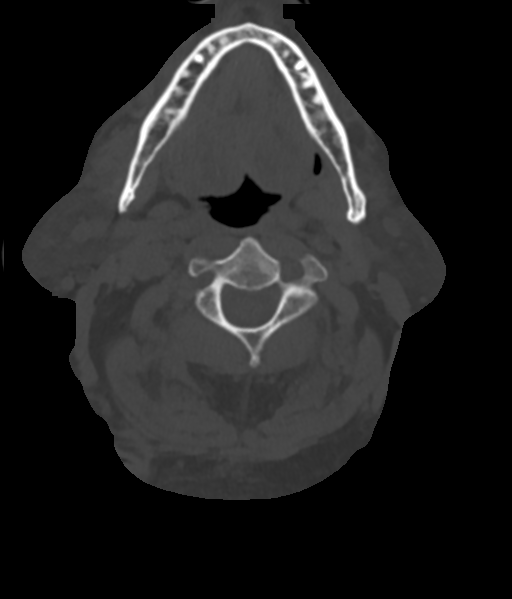
[im 115/138  soft-tissue]
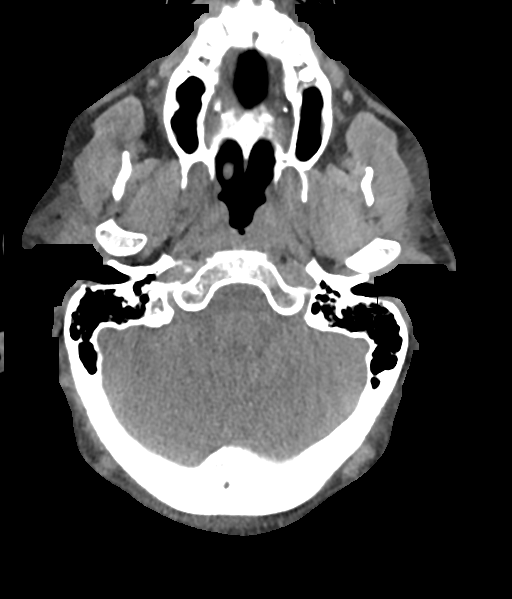
[im 115/138  bone]
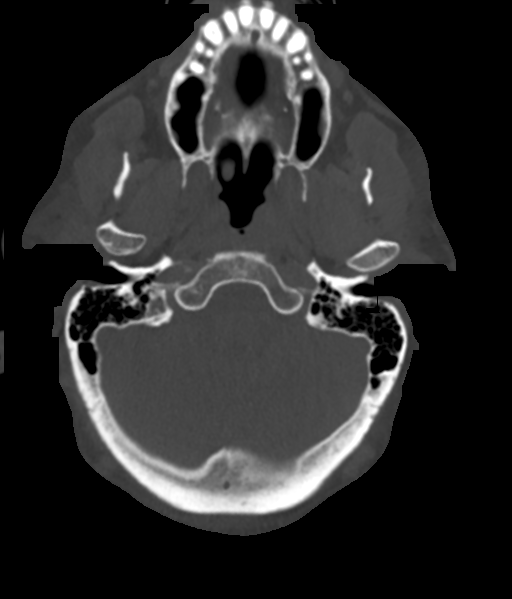

[13 of 34 positions shown; findings below may reference images not displayed]

FINDINGS: Pharynx and larynx: Low-density thickening of the supraglottic
larynx especially at the aryepiglottic folds and to a limited degree
at the epiglottis. Partially closed glottis at time of imaging. No
retropharyngeal collection. Prominent upper thoracic esophagus but
no discrete mass or persistence on reformats. Stable appearance from
prior chest CT.

Salivary glands: No inflammation, mass, or stone.

Thyroid: No worrisome finding.

Lymph nodes: None enlarged or abnormal density.

Vascular: Negative.

Limited intracranial: Negative.

Visualized orbits: Negative.

Mastoids and visualized paranasal sinuses: Clear.

Skeleton: No acute or aggressive process.  Cervical spondylosis

Upper chest: No acute finding
IMPRESSION: Submucosal edema in the supraglottic larynx.

## 2021-08-16 IMAGING — DX DG CHEST 1V
1 series · 1 of 1 positions shown · non-contrast
Comparison: Chest x-ray [DATE], CT angiography chest [DATE]

CLINICAL DATA: Shortness of breath, acute respiratory failure with
hypoxia

EXAM:
CHEST  1 VIEW

[chest ap]
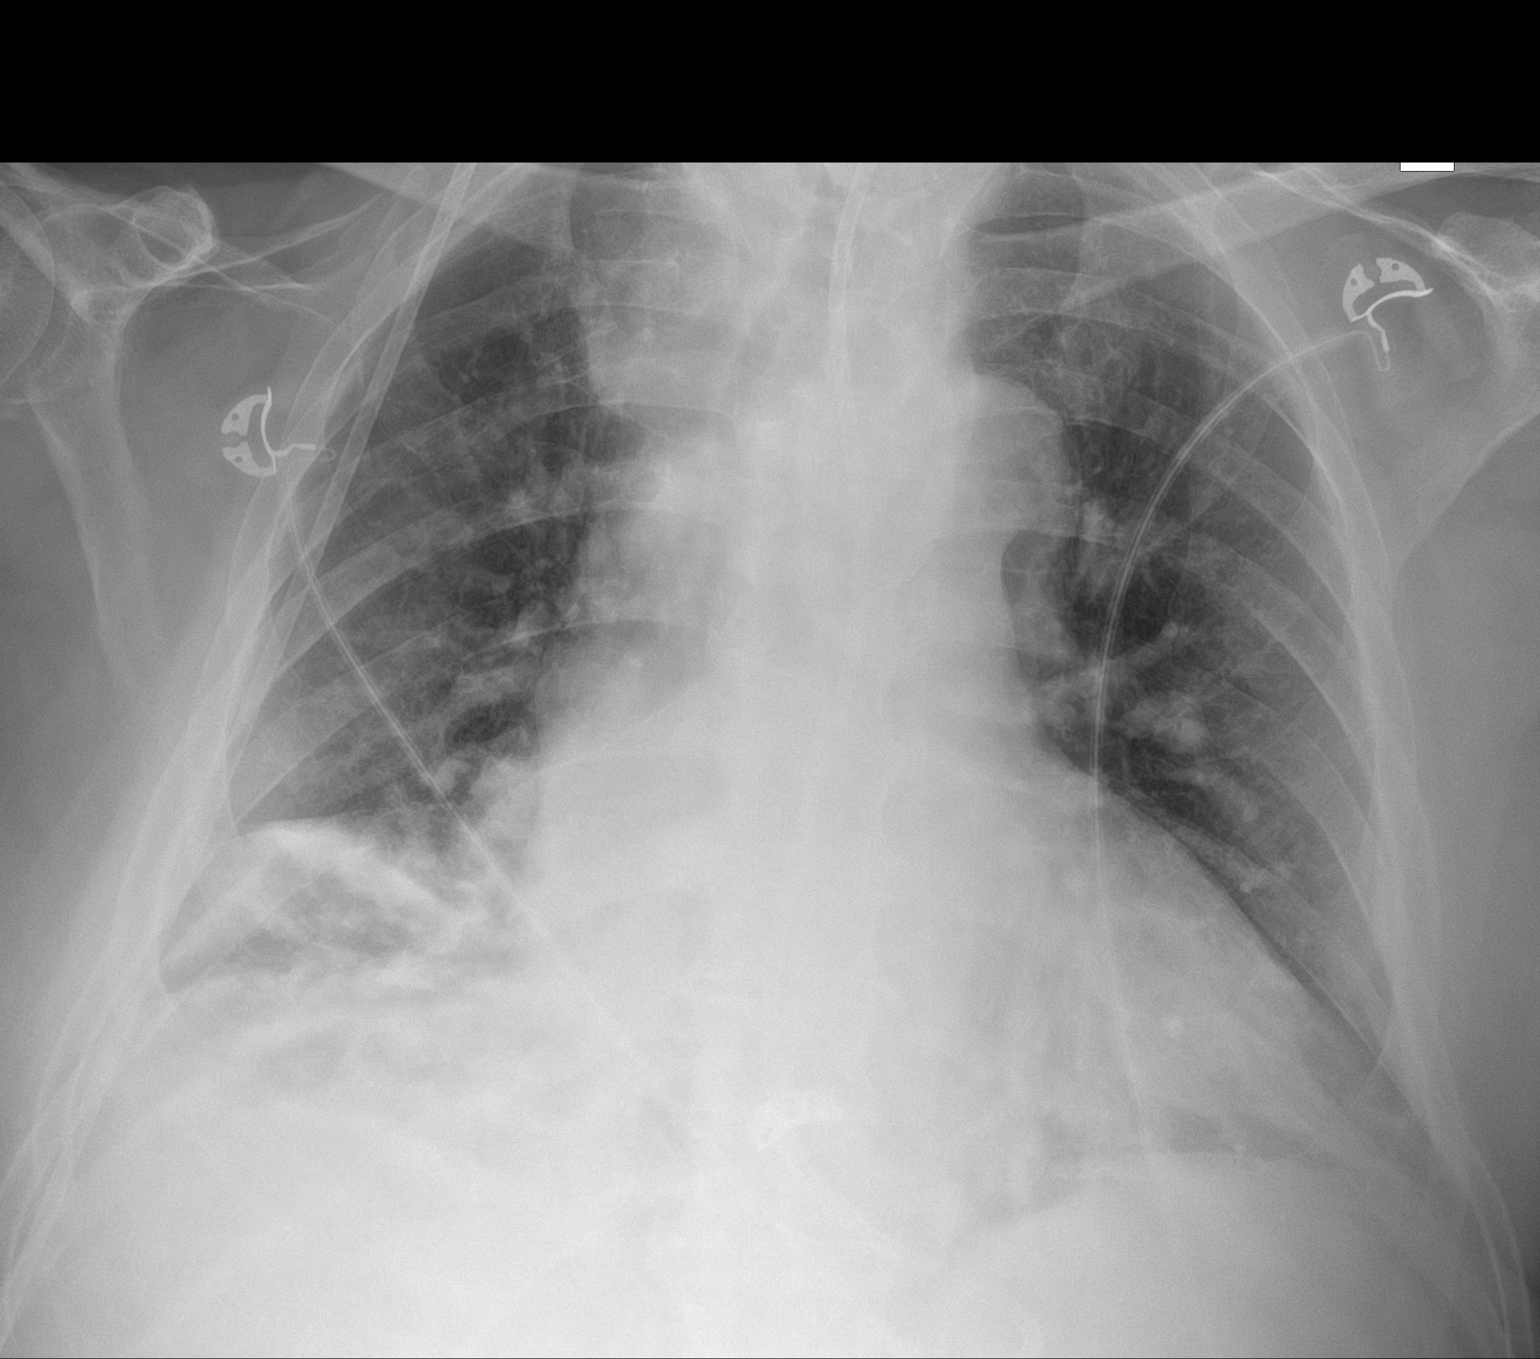

[1 of 1 positions shown; findings below may reference images not displayed]

FINDINGS: Enteric tube coursing below the hemidiaphragm.

The heart and mediastinal contours are unchanged. Aortic
calcification.

Interval increase in right base patchy airspace opacity. No
pulmonary edema. Likely trace right pleural effusion. No
pneumothorax.

No acute osseous abnormality.
IMPRESSION: 1. Likely trace right pleural effusion.
2. Interval increase in right base patchy airspace opacity. Finding
could represent a combination of infection/inflammation versus
atelectasis.

## 2021-08-16 MED ORDER — ACETAMINOPHEN 650 MG RE SUPP
650.0000 mg | Freq: Four times a day (QID) | RECTAL | Status: DC | PRN
  Administered 2021-08-16: 650 mg via RECTAL
  Filled 2021-08-16: qty 1

## 2021-08-16 MED ORDER — SODIUM CHLORIDE 0.9 % IV SOLN
2.0000 g | INTRAVENOUS | Status: DC
  Administered 2021-08-16: 2 g via INTRAVENOUS
  Filled 2021-08-16 (×2): qty 20

## 2021-08-16 MED ORDER — ACETAMINOPHEN 325 MG PO TABS: 650.0000 mg | ORAL_TABLET | Freq: Four times a day (QID) | ORAL | Status: DC | PRN

## 2021-08-16 MED ORDER — POTASSIUM CHLORIDE 10 MEQ/100ML IV SOLN
10.0000 meq | INTRAVENOUS | Status: AC
  Administered 2021-08-16 (×4): 10 meq via INTRAVENOUS
  Filled 2021-08-16 (×4): qty 100

## 2021-08-16 MED ORDER — SODIUM CHLORIDE 0.9 % IV SOLN
500.0000 mg | INTRAVENOUS | Status: AC
  Administered 2021-08-16 – 2021-08-20 (×5): 500 mg via INTRAVENOUS
  Filled 2021-08-16 (×5): qty 5

## 2021-08-16 MED ORDER — ONDANSETRON HCL 4 MG/2ML IJ SOLN
4.0000 mg | Freq: Four times a day (QID) | INTRAMUSCULAR | Status: DC | PRN
  Administered 2021-08-16 – 2021-08-19 (×2): 4 mg via INTRAVENOUS
  Filled 2021-08-16 (×2): qty 2

## 2021-08-16 MED ORDER — OSMOLITE 1.5 CAL PO LIQD
1000.0000 mL | ORAL | Status: DC
  Administered 2021-08-16: 1000 mL
  Filled 2021-08-16 (×2): qty 1000

## 2021-08-16 MED ORDER — FREE WATER
100.0000 mL | Status: DC
  Administered 2021-08-16 – 2021-08-17 (×2): 100 mL

## 2021-08-16 MED ORDER — HYDRALAZINE HCL 20 MG/ML IJ SOLN
10.0000 mg | INTRAMUSCULAR | Status: DC | PRN
Start: 2021-08-16 — End: 2021-08-25
  Administered 2021-08-17 – 2021-08-24 (×7): 10 mg via INTRAVENOUS
  Filled 2021-08-16 (×7): qty 1

## 2021-08-16 MED ORDER — ENOXAPARIN SODIUM 40 MG/0.4ML IJ SOSY
40.0000 mg | PREFILLED_SYRINGE | INTRAMUSCULAR | Status: DC
  Administered 2021-08-16 – 2021-08-23 (×8): 40 mg via SUBCUTANEOUS
  Filled 2021-08-16 (×10): qty 0.4

## 2021-08-16 MED ORDER — LACTATED RINGERS IV SOLN: INTRAVENOUS | Status: AC

## 2021-08-16 MED ORDER — PROSOURCE TF PO LIQD
45.0000 mL | Freq: Every day | ORAL | Status: DC
  Administered 2021-08-16: 45 mL
  Filled 2021-08-16 (×2): qty 45

## 2021-08-16 NOTE — Assessment & Plan Note (Addendum)
-  Complicates overall prognosis and care -Estimated body mass index is 31.38 kg/m as calculated from the following:   Height as of this encounter: 5\' 8"  (1.727 m).   Weight as of this encounter: 93.6 kg.  -Weight Loss and Dietary Counseling given. -Outpatient follow-up with PCP.

## 2021-08-16 NOTE — Progress Notes (Addendum)
Care started prior to midnight in the emergency room patient was admitted early this morning after midnight by Dr. Gean Birchwood and I am in current agreement with his assessment and plan.  Additional changes to the plan of care been made accordingly.  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  He was n.p.o. prior to the scan but decided to take his Prozac pill before going to scan did not use water and he felt like he choked on the pill.  He started having some mild trouble breathing and elevated blood pressure but decided to proceed with a CT scan anyway and then went home and then took his Norvasc and drink some juice.  He started having more trouble throughout the day with his breathing and I brought him to the ED and he was noted to be hypertensive, tachycardic and hypoxic.  Subsequently the patient had likely aspirated his pills and had to be placed on supplemental oxygen via nasal cannula and improved.  He had to be placed on 13 L of supplemental oxygen and when he came to the ED, he underwent a CT scan of the chest which showed aspiration findings.  He was admitted to the stepdown progressive unit and admitted for his hypoxia after aspiration.  Patient became extremely short of breath and began having a persistent cough and patient's wife has noticed that his voice is become more of a whisper.  Patient also complained of sore throat.  In the ED he was hypoxic requiring at least 13 L of supplemental oxygen which was then weaned to 8  L.  CTA of the chest was negative for PE but did show findings concerning for aspiration.  His labs did show leukocytosis and patient was febrile with a temperature of 102 and a COVID test was negative.  He was initially on empiric antibiotics and admitted for further work-up and admitted for sepsis secondary to aspiration pneumonia.  Currently he is being admitted and treated for the following but not limited to:  Acute Respiratory Failure with Hypoxia -In the setting of aspiration event and aspiration pneumonia -Patient aspirated on his Protonix pill and became extremely short of breath going on for a CT scan for his last urology evaluation. -Initiated on empiric antibiotics with IV ceftriaxone and azithromycin which we will continue -SLP evaluated and recommending n.p.o. except ice chips -Given that he is n.p.o. we will place a small bore Cortrak for medication administration and tube feedings -Continue with speech therapy -SpO2: 95 % O2 Flow Rate (L/min): (S) 10 L/min -Continuous pulse oximetry and maintain O2 saturations greater than 90% -Continue supplemental oxygen via nasal cannula wean O2 as tolerated -Patient's voice has become more whisper so a CT scan of the neck soft tissue was done and showed "Submucosal edema in the supraglottic larynx." -Will discuss with ENT about CT Soft Tissue Neck Findings and Dr. Janace Hoard will come assess the patient  Severe Sepsis 2/2 to Aspiration PNA, poA -Patient aspirated yesterday and presented with fever, tachycardia, tachypnea and a source of infection with aspiration findings -Can continue empiric antibiotics  with IV ceftriaxone and azithromycin and will obtain sputum cultures -SLP done and recommending n.p.o. for now -Blood cultures not obtained on admission for unclear reasons so we will obtain now -Respiratory panel done by PCR showed negative influenza A and negative influenza B as well as SARS-CoV-2 testing negative by PCR -CTA of the chest PE  protocol done and showed "Negative for acute pulmonary embolus. Interval finding of mildly nodular and ground-glass airspace disease in the right lung base with debris and or fluid within the bronchi, given clear lung bases on the CT performed earlier today, suspect that findings could be secondary to aspiration." -Chest x-ray done and showed "No acute cardiopulmonary disease or significant interval change" -Given his generalized weakness and worsening swallow function MRI was done to evaluate for CVA and this was negative -We will do sepsis complete work-up and obtain urinalysis however this may be invalid given that he is on antibiotics already -Continue to follow cultures;  -WBC went from 16.1 -> 14.2 -NG Cortrak is being placed in the inability to swallow  Sore Throat with Raspy Voice -CT Neck as Above -ENT to Evaluate  Hypertension -Continue n.p.o. and will initiate amlodipine through small bore feeding tube -Continue to monitor blood pressures per protocol -Last BP was   History of CVA sided hemiparesis Hx of Subdural Hematoma  -Was presently offered aspiration in anticipation for a right-sided nephrectomy anticipation for surgery later this week however he aspirated and is back in the -Given concern for worsening weakness compared to his baseline we have ordered a MRI which showed no acute CVA  History of nephrolithiasis with obstructing stone status post right-sided nephrostomy tube  -Plan was to have a nephrectomy later this week -Patient sees Dr. Tresa Moore neurology as an outpatient -CT Renal Stone study done as an outpatient which we need to get Results  -Will notify Dr. Tresa Moore that patient is currently hospitalized with Aspiration as a courtesy   Chronic kidney disease stage IIIa -Patient's BUN/creatinine is currently at baseline slightly worsened -BUN/creatinine went from 23/1.40 is now 17/1.58 -Avoid further nephrotoxic medications, consciousness, hypotension renally dose  medications -Repeat CMP in the a.m.  Hypokalemia -Patient's K+ went from 3.3 -> 2.9 -Mag Level was 2.9 -Replete with IV Kcl 40 mEQ -Continue to Monitor and Replete as Necessary -Repeat CMP in the AM   OSA -C/w CPAP qHS  Obesity -Complicates overall prognosis and care -Estimated body mass index is 31.38 kg/m as calculated from the following:   Height as of this encounter: 5\' 8"  (1.727 m).   Weight as of this encounter: 93.6 kg.  -Weight Loss and Dietary Counseling given   We will continue to monitor the patient's clinical response to intervention and repeat blood work and imaging in the a.m. and follow-up on specialist recommendations

## 2021-08-16 NOTE — Assessment & Plan Note (Addendum)
-  Patient aspirated PTA and presented with fever, tachycardia, tachypnea and a source of infection with aspiration findings -Was on IV Rocephin and IV azithromycin and transitioned to IV Unasyn due to worsening respiratory status.  -SLP done and recommended n.p.o. early on during the hospitalization and cortrak placed, patient reassessed by SLP and underwent modified barium swallow with mild aspiration risk and clinical improvement and patient subsequently started on a dysphagia 2 diet which she tolerated. -Patient completed full course of IV antibiotics. -No further antibiotics needed on discharge.. -Cortrak discontinued. -Blood cultures with no growth to date x5 days. -Respiratory panel done by PCR showed negative influenza A and negative influenza B as well as SARS-CoV-2 testing negative by PCR -CTA of the chest PE protocol done and showed "Negative for acute pulmonary embolus. Interval finding of mildly nodular and ground-glass airspace disease in the right lung base with debris and or fluid within the bronchi, given clear lung bases on the CT performed earlier today, suspect that findings could be secondary to aspiration." -Repeat CXR done and showed "Likely trace right pleural effusion. Interval increase in right base patchy airspace opacity. Finding could represent a combination of infection/inflammation versus atelectasis." -Given his generalized weakness and worsening swallow function MRI was done to evaluate for CVA and this was negative -Patient was placed on Xopenex and Atrovent as well as Pulmicort.  Also placed on Guaifenesin via tube as well as flutter valve incentive spirometry.

## 2021-08-16 NOTE — Assessment & Plan Note (Addendum)
-  Plan was to have a nephrectomy in the coming weeks -Patient sees Dr. Berneice Heinrich of Urology as an outpatient -CT Renal Stone study done as an outpatient which we need to get Results  -Notified Dr. Berneice Heinrich that patient is currently hospitalized with Aspiration as a courtesy; Dr. Berneice Heinrich has seen the patient and is planning on rescheduling outpatient appointment.

## 2021-08-16 NOTE — Assessment & Plan Note (Addendum)
-  In the setting of aspiration event and aspiration pneumonia -Patient aspirated on his Protonix pill and became extremely short of breath going on for a CT scan for his last urology evaluation. -Initiated on empiric antibiotics with IV ceftriaxone and azithromycin which was subsequently changed to IV Unasyn given worsening respiratory status. -Continued on treatment as below delineated in sepsis and aspiration pneumonia -SLP evaluated and recommended n.p.o. except ice chips early on the hospitalization and cortrak placed. -Cortrak removed and patient followed by speech therapy; -Patient subsequently underwent modified barium swallow today with improvement with swallowing and started on a dysphagia 2 diet which she tolerated. -Sats of 94% on room air. -Continuous pulse oximetry and maintain O2 saturations greater than 90% -Patient initially placed on supplemental oxygen via nasal cannula and O2 weaned as tolerated back to room air.   -Patient's voice has become more whisper early on in the hospitalization, so a CT scan of the neck soft tissue was done and showed "Submucosal edema in the supraglottic larynx. -ENT consulted about CT Soft Tissue Neck Findings and Dr. Jearld Fenton will come assess the patient and he had recommended scope and the patient the patient refused at this time and will continue monitor patient's respiratory status carefully and then consider scoping in a few days but now recommending outpatient follow up.  -ENT feels that he would benefit from a vocal cord augmentation once he recovers -patient improved clinically with sats of 97% on room air on ambulation by day of discharge. -Outpatient follow-up with the ENT 1 week post discharge.

## 2021-08-16 NOTE — Assessment & Plan Note (Addendum)
-  CT Neck as Above  -CT of the neck done and showed "Submucosal edema in the supraglottic larynx." -ENT Dr. Jearld Fenton evaluated the patient during the hospitalization. - Dr. Jearld Fenton had recommended a scope but patient has initially refused.  They recommend continuing to follow to see if the patient proves for scoping.  His hoarseness is starting to improve a little bit and he continues to have no stridor or airway noise. -ENT recommended continuing n.p.o. for now and the patient is doing a little bit better and feel that his voice is respiratory with no breathing issues bilateral.  They feel that his vocal cord weakness that may be why he is aspirating more and they feel that a vocal cord augmentation may be helpful after he recovers more.  They are recommending outpatient evaluation

## 2021-08-16 NOTE — Progress Notes (Signed)
Pt. Refused cpap. 

## 2021-08-16 NOTE — Assessment & Plan Note (Addendum)
-  Patient aspirated and presented with fever, tachycardia, tachypnea and a source of infection with aspiration findings -Patient initially placed on empiric IV antibiotics and subsequently transition to IV Unasyn. -SLP done initially and recommended n.p.o. early on in the hospitalization. -Patient underwent modified barium swallow 08/23/2021 which showed mild aspiration risk but significant improvement and diet advanced to a dysphagia 2 diet which she tolerated. -Blood cultures ordered negative x5 days.  -Respiratory panel done by PCR showed negative influenza A and negative influenza B as well as SARS-CoV-2 testing negative by PCR -CTA of the chest PE protocol done and showed "Negative for acute pulmonary embolus. Interval finding of mildly nodular and ground-glass airspace disease in the right lung base with debris and or fluid within the bronchi, given clear lung bases on the CT performed earlier today, suspect that findings could be secondary to aspiration." -Last chest x-ray done and showed "Likely trace right pleural effusion.Interval increase in right base patchy airspace opacity. Finding could represent a combination of infection/inflammation versus atelectasis". -Patient also placed on scheduled Xopenex and Atrovent, flutter valve, incentive spirometry, guaifenesin.   -Budesonide 0.25 mg nebs twice daily. -Patient completed full course of IV antibiotics during the hospitalization.  -Respiratory status improved and patient was satting 97% on room air by day of discharge.  -Patient will be discharged on Combivent as needed.  -Given his generalized weakness and worsening swallow function MRI was done to evaluate for CVA and this was negative -Urinalysis done nitrite negative, leukocytes negative, 11-20 WBCs -Blood cultures negative x5 days. -WBC went from 16.1 -> 14.2 -> 11.2 -> 8.9 -> 8.7 -> 6.8 >> 7.7>>> 7.8 >>>> 8.3 -NG Cortrak was placed due to the inability to swallow; -Cortrak discontinued  and patient underwent modified barium swallow with mild aspiration risk and diet advanced to a dysphagia 2 diet per SLP.   -Repeat chest x-ray showed "Cardiac and mediastinal contours are unchanged. Improved aeration of the lung bases, likely due to decreased atelectasis. No large pleural effusion or evidence of pneumothorax. Enteric feeding tube partially seen coursing below the diaphragm." -Patient completed full course of antibiotics during the hospitalization and no further antibiotics needed on discharge.

## 2021-08-16 NOTE — Progress Notes (Signed)
Modified Barium Swallow Progress Note  Patient Details  Name: Nicholas Mckenzie MRN: [id number] Date of Birth: 08-Jan-1946  Today's Date: 08/16/2021  Modified Barium Swallow completed.  Full report located under Chart Review in the Imaging Section.  Brief recommendations include the following:  Clinical Impression  Patient currently presents with severe pharyngo with suspected cervical esophageal dysphagia with sensorimotor deficits.  Minimally decreased oral control noted but primary deficit is pharyngeal.  Delayed swallow trigger to pyriform sinus with liquids as well as impaired tongue base retraction/airway closure allows aspiration of secretions, laryngeal penetration and aspiration of all consistencies tested.  Pt without consistent sensation to aspiration and reflexive nor cued cough effectively cleared aspirates.  Various postures including chin tuck with and without head turn left and chair reclined to approx 45* did not completely prevent penetration/aspiration.  Effective x1 with 1/2 tsp nectar only.  In addition, if pt cleared larynx, he repenetrated/aspirated due to pharyngeal retention.  SLP questions if pt's Submucosal edema in the supraglottic larynx may be negatively impacting laryngeal closure - contributing to his dysphagia.  Educated pt during testing using video feedback.  Pt with h/o dysphagia per his statement, with nasal regurgitation if eating too fast and a "little coughing" with po intake. At this time, due to pt report of severe worsening dysphagia,  recommend consider short term alternative means of nutrition and SLP follow up for dysphagia management.  Repeat MBS will be indicated prior to intiation of po diet due to pt's sensorimotor deficits.  Suspect some component of chronic dysphagia/aspiration due to prior CVA that pt has managed due to mobiilty, strength of cough, overall health, etc until this recent severe aspiration episode.   Swallow Evaluation Recommendations        SLP Diet Recommendations: NPO;Ice chips PRN after oral care (tsp amount of water after mouth care)       Medication Administration: Via alternative means       Compensations: Multiple dry swallows after each bite/sip;Other (Comment) (cough and expectorate prn)       Oral Care Recommendations: Oral care QID       Rolena Infante, MS [facility] SLP Acute Rehab Services Office [phone] Pager [phone]  Chales Abrahams 08/16/2021,10:11 AM

## 2021-08-16 NOTE — Assessment & Plan Note (Addendum)
-  Was n.p.o.  and as such patient started on IV Lopressor.   -Patient diet advanced to dysphagia 2 diet. -Patient's home regimen Norvasc resumed and IV Lopressor discontinued. -Outpatient follow-up with PCP.

## 2021-08-16 NOTE — Assessment & Plan Note (Addendum)
-  Was presently obtaining CT in anticipation for a right-sided nephrectomy  later this week however he aspirated un the CT Scanner -Given concern for worsening weakness compared to his baseline MRI was done which was negative for acute CVA. -PT/OT evaluated patient and recommended home health therapies which were ordered and set up by day of discharge.

## 2021-08-16 NOTE — Assessment & Plan Note (Addendum)
-  Patient's BUN/creatinine remained stable and close to baseline during the hospitalization. -BUN/creatinine went from 23/1.40 -> 17/1.58 ->  12/1.36 -> 12/1.27 -> 12/1.20 -> 16/1.15 -> 19/1.26 -> 22/1.33>> 23/1.51. -Has a Right Nephrostomy tube but has been accidentally dislodged so IR was consulted for further evaluation and Replacement. -Status post right nephrostomy tube exchange successfully 08/22/2021. -Patient with good urine output and was -7.521 L during his hospitalization. -Avoided further nephrotoxic medications,  hypotension renally dose medications. -Patient gently hydrated. -Renal function had stabilized by day of discharge and was close to baseline. -Outpatient follow-up.

## 2021-08-16 NOTE — Procedures (Signed)
Cortrak  Person Inserting Tube:  Orhan Mayorga, RD Tube Type:  Cortrak - 43 inches Tube Size:  10 Tube Location:  Right nare Initial Placement:  Stomach Secured by: Bridle Technique Used to Measure Tube Placement:  Marking at nare/corner of mouth Cortrak Secured At:  70 cm Procedure Comments:  Cortrak Tube Team Note:  Consult received to place a Cortrak feeding tube.   X-ray is required, abdominal x-ray has been ordered by the Cortrak team. Please confirm tube placement before using the Cortrak tube.   If the tube becomes dislodged please keep the tube and contact the Cortrak team at [email or web address] (password [email or web address]) for replacement.  If after hours and replacement cannot be delayed, place a NG tube and confirm placement with an abdominal x-ray.     Tearah Saulsbury MS, RDN, LDN, CNSC Registered Dietitian III Clinical Nutrition RD Pager and On-Call Pager Number Located in Amion      

## 2021-08-16 NOTE — H&P (Signed)
History and Physical    Nicholas Mckenzie [id number] DOB: 22-Oct-1945 DOA: 08/15/2021  PCP: [name], MD  Patient coming from: Home.  Chief Complaint: Hypoxia after aspiration.  HPI: Nicholas Mckenzie is a 76 y.o. male with history of CVA with left-sided hemiparesis, chronic kidney disease stage III, history of kidney stones with obstruction status post right-sided nephrostomy, hypertension, sleep apnea was admitted in December of this year for subdural hematoma was urgently scheduled to have a scan yesterday during which patient swallowed a pill following which became very short of breath and was taken to the ER.  Patient had benign persistent cough after this and also patient wife of the patient's voice became more of a whisper.  Patient also complained of sore throat.  ED Course: In the ER patient was hypoxic requiring 8 L oxygen CT angiogram of the chest was negative for PE but did show features concerning for aspiration.  Patient's labs did show leukocytosis patient was febrile with temperature for 102 F and COVID test was negative.  Patient was started on empiric antibiotic admitted for further work-up.  Review of Systems: As per HPI, rest all negative.   Past Medical History:  Diagnosis Date   CKD (chronic kidney disease) stage 3, GFR 30-59 ml/min (HCC)    Hypertension    Kidney stones    Sleep apnea     Past Surgical History:  Procedure Laterality Date   CATARACT EXTRACTION     IR NEPHROSTOMY EXCHANGE RIGHT  06/07/2021   IR NEPHROSTOMY PLACEMENT RIGHT  04/07/2021   IR PATIENT EVAL TECH 0-60 MINS  06/02/2021   kidney stent     MANDIBLE FRACTURE SURGERY       reports that he has never smoked. He has never used smokeless tobacco. He reports current alcohol use. No history on file for drug use.  Allergies  Allergen Reactions   Statins Other (See Comments)    Pt reports severe weakness and mobility issues with his med, as well as diarrhea    Family History  Problem Relation  Age of Onset   Hypertension Mother    Hypertension Other     Prior to Admission medications   Medication Sig Start Date End Date Taking? Authorizing Provider  amLODipine (NORVASC) 5 MG tablet Take 1 tablet (5 mg total) by mouth in the morning and at bedtime. 06/08/21   Hongalgi, Lenis Dickinson, MD  aspirin EC 81 MG tablet Take 1 tablet (81 mg total) by mouth daily. Swallow whole. 07/19/21   Frann Rider, NP  FLUoxetine (PROZAC) 40 MG capsule Take 40 mg by mouth daily.    [provider]  lactobacillus (FLORANEX/LACTINEX) PACK Take 1 packet (1 g total) by mouth 2 (two) times daily. 04/13/21   Antonieta Pert, MD  melatonin 3 MG TABS tablet Take 3 mg by mouth at bedtime.    [provider]  Multiple Vitamins-Minerals (MULTIVITAMIN WITH MINERALS) tablet Take 1 tablet by mouth daily.    [provider]    Physical Exam: Constitutional: Moderately built and nourished. Vitals:   08/15/21 2230 08/15/21 2234 08/15/21 2258 08/16/21 0005  BP: (!) 135/91   (!) 146/87  Pulse: 93   91  Resp: 12   12  Temp:   100 F (37.8 C) (!) 100.4 F (38 C)  TempSrc:   Oral Oral  SpO2: 95% 95%  97%  Weight:      Height:       Eyes: Anicteric no pallor. ENMT: No discharge from  the ears eyes nose and mouth. Neck: No mass felt.  No neck rigidity. Respiratory: No rhonchi or crepitations. Cardiovascular: S1-S2 heard. Abdomen: Soft nontender bowel sound present. Musculoskeletal: No edema. Skin: No rash. Neurologic: Alert awake oriented time place and person.  Has mild weakness of the left upper and lower extremity which is chronic. Psychiatric: Appears normal.  Normal affect.   Labs on Admission: I have personally reviewed following labs and imaging studies  CBC: Recent Labs  Lab 08/15/21 1712  WBC 16.1*  NEUTROABS 14.4*  HGB 14.8  HCT 45.8  MCV 84.2  PLT 123456   Basic Metabolic Panel: Recent Labs  Lab 08/15/21 1712  NA 141  K 3.3*  CL 101  CO2 26  GLUCOSE 120*  BUN 23   CREATININE 1.40*  CALCIUM 10.1   GFR: Estimated Creatinine Clearance: 50.6 mL/min (A) (by C-G formula based on SCr of 1.4 mg/dL (H)). Liver Function Tests: Recent Labs  Lab 08/15/21 1712  AST 18  ALT 15  ALKPHOS 57  BILITOT 0.5  PROT 7.6  ALBUMIN 4.7   No results for input(s): LIPASE, AMYLASE in the last 168 hours. No results for input(s): AMMONIA in the last 168 hours. Coagulation Profile: No results for input(s): INR, PROTIME in the last 168 hours. Cardiac Enzymes: No results for input(s): CKTOTAL, CKMB, CKMBINDEX, TROPONINI in the last 168 hours. BNP (last 3 results) No results for input(s): PROBNP in the last 8760 hours. HbA1C: No results for input(s): HGBA1C in the last 72 hours. CBG: No results for input(s): GLUCAP in the last 168 hours. Lipid Profile: No results for input(s): CHOL, HDL, LDLCALC, TRIG, CHOLHDL, LDLDIRECT in the last 72 hours. Thyroid Function Tests: No results for input(s): TSH, T4TOTAL, FREET4, T3FREE, THYROIDAB in the last 72 hours. Anemia Panel: No results for input(s): VITAMINB12, FOLATE, FERRITIN, TIBC, IRON, RETICCTPCT in the last 72 hours. Urine analysis:    Component Value Date/Time   COLORURINE YELLOW 06/06/2021 [place] 06/06/2021 1545   LABSPEC 1.017 06/06/2021 1545   PHURINE 6.5 06/06/2021 1545   GLUCOSEU NEGATIVE 06/06/2021 1545   [place] 06/06/2021 [place] 06/06/2021 1545   [place] 06/06/2021 1545   PROTEINUR 30 (A) 06/06/2021 1545   NITRITE NEGATIVE 06/06/2021 1545   LEUKOCYTESUR MODERATE (A) 06/06/2021 1545   Sepsis Labs: @LABRCNTIP (procalcitonin:4,lacticidven:4) ) Recent Results (from the past 240 hour(s))  Resp Panel by RT-PCR (Flu A&B, Covid) Nasopharyngeal Swab     Status: None   Collection Time: 08/15/21  5:12 PM   Specimen: Nasopharyngeal Swab; Nasopharyngeal(NP) swabs in vial transport medium  Result Value Ref Range Status   SARS Coronavirus 2 by RT PCR  NEGATIVE NEGATIVE Final    Comment: (NOTE) SARS-CoV-2 target nucleic acids are NOT DETECTED.  The SARS-CoV-2 RNA is generally detectable in upper respiratory specimens during the acute phase of infection. The lowest concentration of SARS-CoV-2 viral copies this assay can detect is 138 copies/mL. A negative result does not preclude SARS-Cov-2 infection and should not be used as the sole basis for treatment or other patient management decisions. A negative result may occur with  improper specimen collection/handling, submission of specimen other than nasopharyngeal swab, presence of viral mutation(s) within the areas targeted by this assay, and inadequate number of viral copies(<138 copies/mL). A negative result must be combined with clinical observations, patient history, and epidemiological information. The expected result is Negative.  Fact Sheet for Patients:  EntrepreneurPulse.com.au  Fact Sheet for Healthcare Providers:  IncredibleEmployment.be  This test is no t yet approved or cleared by the [place] and  has been authorized for detection and/or diagnosis of SARS-CoV-2 by FDA under an Emergency Use Authorization (EUA). This EUA will remain  in effect (meaning this test can be used) for the duration of the COVID-19 declaration under Section 564(b)(1) of the Act, 21 U.S.C.section 360bbb-3(b)(1), unless the authorization is terminated  or revoked sooner.       Influenza A by PCR NEGATIVE NEGATIVE Final   Influenza B by PCR NEGATIVE NEGATIVE Final    Comment: (NOTE) The Xpert Xpress SARS-CoV-2/FLU/RSV plus assay is intended as an aid in the diagnosis of influenza from Nasopharyngeal swab specimens and should not be used as a sole basis for treatment. Nasal washings and aspirates are unacceptable for Xpert Xpress SARS-CoV-2/FLU/RSV testing.  Fact Sheet for Patients: EntrepreneurPulse.com.au  Fact Sheet for  Healthcare Providers: IncredibleEmployment.be  This test is not yet approved or cleared by the [place] FDA and has been authorized for detection and/or diagnosis of SARS-CoV-2 by FDA under an Emergency Use Authorization (EUA). This EUA will remain in effect (meaning this test can be used) for the duration of the COVID-19 declaration under Section 564(b)(1) of the Act, 21 U.S.C. section 360bbb-3(b)(1), unless the authorization is terminated or revoked.  Performed at [facility], [address] [place], [place] [address]      Radiological Exams on Admission: CT Angio Chest PE W/Cm &/Or Wo Cm  Result Date: 08/15/2021 CLINICAL DATA:  Tachycardia EXAM: CT ANGIOGRAPHY CHEST WITH CONTRAST TECHNIQUE: Multidetector CT imaging of the chest was performed using the standard protocol during bolus administration of intravenous contrast. Multiplanar CT image reconstructions and MIPs were obtained to evaluate the vascular anatomy. RADIATION DOSE REDUCTION: This exam was performed according to the departmental dose-optimization program which includes automated exposure control, adjustment of the mA and/or kV according to patient size and/or use of iterative reconstruction technique. CONTRAST:  50mL OMNIPAQUE IOHEXOL 350 MG/ML SOLN COMPARISON:  Chest x-ray 08/15/2021, CT abdomen pelvis 08/15/2021 FINDINGS: Cardiovascular: Satisfactory opacification of the pulmonary arteries to the segmental level. No evidence of pulmonary embolism. Mild aortic atherosclerosis. No aneurysm. Normal cardiac size. No pericardial effusion Mediastinum/Nodes: Midline trachea. No suspicious thyroid nodule. No suspicious lymph nodes. Small hiatal hernia. Lungs/Pleura: No pleural effusion or pneumothorax. Mild nodularity and ground-glass density in the right lower lobe with debris within right lower lobe bronchi. Upper Abdomen: Partially visualized atrophic right kidney. Cysts upper pole left  kidney Musculoskeletal: No acute osseous abnormality. Review of the MIP images confirms the above findings. IMPRESSION: 1. Negative for acute pulmonary embolus. 2. Interval finding of mildly nodular and ground-glass airspace disease in the right lung base with debris and or fluid within the bronchi, given clear lung bases on the CT performed earlier today, suspect that findings could be secondary to aspiration. Aortic Atherosclerosis (ICD10-I70.0). Electronically Signed   By: Donavan Foil M.D.   On: 08/15/2021 18:41   DG Chest Port 1 View  Result Date: 08/15/2021 CLINICAL DATA:  Shortness of breath. EXAM: PORTABLE CHEST 1 VIEW COMPARISON:  Two-view chest x-ray 06/06/2021 FINDINGS: Heart size is normal. Atherosclerotic changes are again seen at the aortic arch. Chronic interstitial markings present. No edema or effusion is present. No focal airspace disease is present. Degenerative changes are noted in the shoulders. IMPRESSION: No acute cardiopulmonary disease or significant interval change. Electronically Signed   By: San Morelle M.D.   On: 08/15/2021 16:28    EKG: Independently reviewed.  Sinus tachycardia.  Assessment/Plan Principal Problem:   Acute respiratory failure with hypoxia (HCC) Active Problems:   Hypertension   H/O ischemic right PCA stroke   Aspiration pneumonia (HCC)    Acute respiratory failure with hypoxia presently on 8 L oxygen with aspiration event likely from aspiration pneumonia for which patient is on ceftriaxone and azithromycin we will check sputum cultures we will get speech therapy evaluation until then patient will be n.p.o.  Since patient states he has sore throat like feeling and his wife felt that his voice has become more of a whisper we will get CT scan of the soft neck tissue. Hypertension presently n.p.o. we will keep patient on as needed IV hydralazine. History of stroke with left-sided hemiparesis presently was off aspirin anticipation of surgery this  week for right-sided nephrectomy. History of nephrolithiasis with obstructing stone status post right-sided nephrostomy tube plan to have nephrectomy this week by Dr. Collene Mares urologist. Chronic kidney disease stage III creatinine appears to be at baseline. Hypokalemia replace and recheck. History of sleep apnea on CPAP at bedtime. Recently admitted for subdural hematoma in December 2022 after a fall.  Since patient has acute respiratory failure with hypoxia requiring 8 L oxygen will need close monitoring and inpatient status.   DVT prophylaxis: Lovenox. Code Status: DNR. Family Communication: Patient's wife at the bedside. Disposition Plan: Home. Consults called: Speech therapist. Admission status: Inpatient.   Rise Patience MD [facility] Pager [phone].  If 7PM-7AM, please contact night-coverage [email or web address] Password [email or web address]  08/16/2021, 2:00 AM

## 2021-08-16 NOTE — Progress Notes (Signed)
Pt. Coughing white thick secreations. And doing flutter while awake alert.

## 2021-08-16 NOTE — Assessment & Plan Note (Addendum)
-  Patient was maintained on CPAP nightly.

## 2021-08-16 NOTE — Progress Notes (Addendum)
Initial Nutrition Assessment  DOCUMENTATION CODES:  Non-severe (moderate) malnutrition in context of social or environmental circumstances  INTERVENTION:  Initiate tube feeding via cortrak tube: Osmolite 1.5 at 88ml/h (1440 ml per day). Initiate TF at 8mL/h and advance by 34mL q8h to goal of 60. Monitor for signs of refeeding. Prosource TF 45 ml daily Free water q4h Regimen provides 2200 kcal, 101 gm protein, 1697 ml free water daily Monitor pt for signs of refeeding. Replace electrolytes as needed  NUTRITION DIAGNOSIS:  Moderate Malnutrition (in the context of social/environmental circumstances) related to dysphagia as evidenced by mild muscle depletion, mild fat depletion, percent weight loss (10.3% x 6 months).  GOAL:  Patient will meet greater than or equal to 90% of their needs   MONITOR:  TF tolerance, Labs  REASON FOR ASSESSMENT:  Other (Comment) (new cortrack placement)    ASSESSMENT:  76 y.o. male with history of HTN, CKD3, and sleep apnea presented to ED with SOB worsening throughout the day. In ED, noted to be hypertensive, tachycardic and hypoxic. Attributed this to aspiration event earlier in the day.  Per chart review, pt dry-swallowed a pill 2/21 due to being NPO for an outpatient CT. Pt feels that he choked the pill and developed some mild SOB and elevated BP which worsened as the day went on. After CT pt noted to have consumed beet juice. Vomited pink liquid (likely beet juice) in ED and found to have aspiration pneumonia.   SLP evaluated 2/22 and performed MBS and found severe dysphagia. Also noted supraglottic edema in imaging. Plans to repeat MBS. Cortrak to be placed for nutrition and medications today.  Pt resting in bed at the time of assessment. Pt reports that swallowing difficulty has been ongoing for about 6 months but has gotten progressively more noticeable. Noted significant weight loss of 10.3% in the last 6 months as well. Pt confirms this was  unintentional.   Pt reports that years ago he did lost 100 lbs intentionally, but that over the last 6 months, he has had a great appetite and has been eating well but that weight has continued to drop.   Discussed the reason for the cortrak tube placement and all questions were answered.   Nutritionally Relevant Medications: Continuous Infusions:  azithromycin 250 mL/hr at 08/16/21 0600   PRN Meds: ondansetron  Labs Reviewed: Potassium 2.9 Creatinine 1.58   NUTRITION - FOCUSED PHYSICAL EXAM: Flowsheet Row Most Recent Value  Orbital Region Mild depletion  Upper Arm Region Mild depletion  Thoracic and Lumbar Region Mild depletion  Buccal Region Mild depletion  Temple Region No depletion  Clavicle Bone Region Mild depletion  Clavicle and Acromion Bone Region No depletion  Scapular Bone Region No depletion  Patellar Region Mild depletion  Anterior Thigh Region Mild depletion  Posterior Calf Region Mild depletion  Edema (RD Assessment) None  Hair Reviewed  Eyes Reviewed  Mouth Reviewed  Skin Reviewed  Nails Reviewed   Diet Order:   Diet Order     None       EDUCATION NEEDS:  Education needs have been addressed  Skin:  Skin Assessment: Reviewed RN Assessment  Last BM:  2/20  Height:  Ht Readings from Last 1 Encounters:  08/16/21 5\' 8"  (1.727 m)    Weight:  Wt Readings from Last 1 Encounters:  08/16/21 93.6 kg    Ideal Body Weight:  70 kg  BMI:  Body mass index is 31.38 kg/m.  Estimated Nutritional Needs:  Kcal:  2000-2200 kcal/d Protein:  100-115 g/d Fluid:  >2 L/d   Greig Castilla, RD, LDN Clinical Dietitian RD pager # available in [facility]  After hours/weekend pager # available in [facility]

## 2021-08-16 NOTE — Evaluation (Signed)
Clinical/Bedside Swallow Evaluation Patient Details  Name: Nicholas Mckenzie MRN: [id number] Date of Birth: [birth year]  Today's Date: 08/16/2021 Time: SLP Start Time (ACUTE ONLY): 0750 SLP Stop Time (ACUTE ONLY): 0803 SLP Time Calculation (min) (ACUTE ONLY): 13 min  Past Medical History:  Past Medical History:  Diagnosis Date   CKD (chronic kidney disease) stage 3, GFR 30-59 ml/min (HCC)    Hypertension    Kidney stones    Sleep apnea    Past Surgical History:  Past Surgical History:  Procedure Laterality Date   CATARACT EXTRACTION     IR NEPHROSTOMY EXCHANGE RIGHT  06/07/2021   IR NEPHROSTOMY PLACEMENT RIGHT  04/07/2021   IR PATIENT EVAL TECH 0-60 MINS  06/02/2021   kidney stent     MANDIBLE FRACTURE SURGERY     HPI:  Pt is a 76 yo male adm to [facility] after concern for aspiration event with pill.  Pt CT chest showed "Interval finding of mildly nodular and ground-glass airspace  disease in the right lung base with debris and or fluid within the  bronchi, given clear lung bases on the CT performed earlier today,  suspect that findings could be secondary to aspiration."  Swallow eval ordered. 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  Pt reports he tried to swallow pill dry and this resulted in aspiration.    Assessment / Plan / Recommendation  Clinical Impression  Patient presents with clinical indications of aspiration with small amount of water he swallowed after oral care c/b immediate cough and expectoration of secretions.  Has h/o CVAs (basal ganglia most significantly) and uvulectomy approx 10 years ago.  Facial, trigeminal, vagus and potental glossopharyngeal nerve involvements noted that may impact pharyngeal contraction and allow nasal regurgitation of  liquids.  Pt admits to nasal loss of liquids if he is "talking when eating" but does verbalize when this initiated.  SLP at this time will proceed with MBS due to pt's aspiration event to assess oropharyngeal swallow.  He does have h/o hiatal hernia and reflux - thus may have multifactorial dysphagia.  Educated pt to plan for MBS and he was agreeable. SLP Visit Diagnosis: Dysphagia, pharyngeal phase (R13.13);Dysphagia, unspecified (R13.10)    Aspiration Risk  Severe aspiration risk    Diet Recommendation NPO   Supervision: Patient able to self feed    Other  Recommendations Oral Care Recommendations: Oral care QID    Recommendations for follow up therapy are one component of a multi-disciplinary discharge planning process, led by the attending physician.  Recommendations may be updated based on patient status, additional functional criteria and insurance authorization.  Follow up Recommendations Home health SLP      Assistance Recommended at Discharge Frequent or constant Supervision/Assistance  Functional Status Assessment Patient has had a recent decline in their functional status and demonstrates the ability to make significant improvements in function in a reasonable and predictable amount of time.  Frequency and Duration     tbd       Prognosis    tbd    Swallow Study   General HPI: Pt is a 76 yo male adm to [facility] after concern for aspiration event with pill.  Pt CT chest showed "Interval finding of mildly nodular and ground-glass airspace  disease in the right lung base with debris and or fluid  within the  bronchi, given clear lung bases on the CT performed earlier today,  suspect that findings could be secondary to aspiration."  Swallow eval ordered. 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  Pt reports he tried to swallow pill dry and this resulted in aspiration. Type of Study: Bedside Swallow Evaluation Diet Prior to this Study: NPO Temperature Spikes Noted: No Respiratory Status: Nasal cannula History of Recent Intubation: No Behavior/Cognition: Alert;Cooperative;Pleasant mood Oral Cavity Assessment: Dry Oral Care Completed by SLP: Yes Oral Cavity - Dentition: Adequate natural dentition Vision: Functional for self-feeding Self-Feeding Abilities: Able to feed self Patient Positioning: Upright in bed Baseline Vocal Quality: Breathy;Low vocal intensity Volitional Swallow: Able to elicit    Oral/Motor/Sensory Function Overall Oral Motor/Sensory Function: Moderate impairment Facial ROM: Reduced left Facial Symmetry: Abnormal symmetry left Facial Sensation: Reduced left Lingual ROM: Within Functional Limits Lingual Symmetry: Within Functional Limits Lingual Strength: Within Functional Limits Velum: Other (comment) (pt s/p uvulectomy for snoring) Mandible: Within Functional Limits   Ice Chips Ice chips: Not tested   Thin Liquid Thin Liquid: Impaired Pharyngeal  Phase Impairments: Cough - Immediate    Nectar Thick Nectar Thick Liquid: Not tested   Honey Thick Honey Thick Liquid: Not tested   Puree Puree: Not tested   Solid     Solid: Not tested      Nicholas Mckenzie 08/16/2021,8:12 AM  Kathleen Lime, MS [place] Office [phone] Pager [phone]

## 2021-08-16 NOTE — Assessment & Plan Note (Addendum)
-  Repleted.   -Potassium at 4.3. -Repeat labs in the AM. 

## 2021-08-16 NOTE — Progress Notes (Signed)
Speech Language Pathology Treatment: Dysphagia  Patient Details Name: Nicholas Mckenzie MRN: [id number] DOB: 1946/03/17 Today's Date: 08/16/2021 Time: 0912-0930 SLP Time Calculation (min) (ACUTE ONLY): 18 min  Assessment / Plan / Recommendation Clinical Impression  SLP follow up session after MBS to educate pt (in his room) and his wife (via cell phone on speaker) to his study findings, severity of dysphagia and establish baseline swallow function.  SLP phoned [name] and educated her to pt's dysphagia and potential plan *after speaking to Md*.  SLP showed pt his MBS vs a normal study to improve understanding.  Wife denies pt having dysphagia prior to admit - but pt and wife admit to a "little coughing" with po.  However pt reports progressive issues in the last six months.  Pt's was to see a [facility] SLP today for dysarthria, and wife reports his speech has been improving since his CVA per wife.  She states " all of this started yesterday when he swallowed a pill dry".  Hoarseness, impaired secretion management, etc with sudden onset. Pt admits to pill "burning" and per imaging of neck, supraglottic edema noted.  Both agreeable for Cortrak to obtain nutrition and repeat MBS in near future.    Advised pt and his wife to goal being for pt's swallow to return to "dysfunctional functional level".  Also addressed functional reserve and pt's Dec admit with SDH and COVID.  Reviewed importance to maintain strength of cough and expectoration for airway protection, which pt demonstrated effectively.     Hopefully pt's swallow will improve during this acute hospital stay given "sudden onset" and edema. Will follow for readiness for repeat MBS and to initiate swallowing exercises as pt does not complaint of odynophagia.    HPI HPI: Pt is a 76 yo male adm to [facility] after concern for aspiration event with pill.  Pt CT chest showed "Interval finding of mildly nodular and ground-glass airspace  disease in the right lung base with  debris and or fluid within the  bronchi, given clear lung bases on the CT performed earlier today,  suspect that findings could be secondary to aspiration."  Swallow eval ordered. 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  Pt reports he tried to swallow pill dry and this resulted in aspiration.  Per CT neck, pt has Submucosal edema in the supraglottic larynx.      SLP Plan  Continue with current plan of care      Recommendations for follow up therapy are one component of a multi-disciplinary discharge planning process, led by the attending physician.  Recommendations may be updated based on patient status, additional functional criteria and insurance authorization.    Recommendations  Diet recommendations: NPO;Other(comment) (small single ice chips, 1 tsp water, pt able to swish and expectorate with water) Compensations: Multiple dry swallows after each bite/sip;Other (Comment) Postural Changes and/or Swallow Maneuvers: Seated upright 90 degrees;Upright 30-60 min after meal                Oral Care Recommendations: Oral care QID Follow Up Recommendations: Home health SLP Assistance recommended at discharge: Frequent or constant Supervision/Assistance SLP Visit Diagnosis: Dysphagia, pharyngeal phase (R13.13);Dysphagia, pharyngoesophageal phase (R13.14) Plan: Continue with current plan of care           [employer], Shelbie Ammons, MS [facility] SLP  Acute Rehab Services Office [phone] Pager [phone]    08/16/2021, 10:30 AM

## 2021-08-16 NOTE — Consult Note (Signed)
Reason for Consult:supraglottic swelling Referring Physician: hospitalist  Nicholas Mckenzie is an 76 y.o. male.  HPI: hx of swallowing a pill and felt like it stuck then burned his throat. He immediately had burning of throat and ears after the incident. He was fine prior to the problem. He now has some hoarseness of voice. He no longer has the burning. He has aspiration by speech eval. He has hx of stroke.   Past Medical History:  Diagnosis Date   CKD (chronic kidney disease) stage 3, GFR 30-59 ml/min (HCC)    Hypertension    Kidney stones    Sleep apnea     Past Surgical History:  Procedure Laterality Date   CATARACT EXTRACTION     IR NEPHROSTOMY EXCHANGE RIGHT  06/07/2021   IR NEPHROSTOMY PLACEMENT RIGHT  04/07/2021   IR PATIENT EVAL TECH 0-60 MINS  06/02/2021   kidney stent     MANDIBLE FRACTURE SURGERY      Family History  Problem Relation Age of Onset   Hypertension Mother    Hypertension Other     Social History:  reports that he has never smoked. He has never used smokeless tobacco. He reports current alcohol use. No history on file for drug use.  Allergies:  Allergies  Allergen Reactions   Statins Other (See Comments)    Pt reports severe weakness and mobility issues with his med, as well as diarrhea    Medications: I have reviewed the patient's current medications.  Results for orders placed or performed during the hospital encounter of 08/15/21 (from the past 48 hour(s))  Comprehensive metabolic panel     Status: Abnormal   Collection Time: 08/15/21  5:12 PM  Result Value Ref Range   Sodium 141 135 - 145 mmol/L   Potassium 3.3 (L) 3.5 - 5.1 mmol/L   Chloride 101 98 - 111 mmol/L   CO2 26 22 - 32 mmol/L   Glucose, Bld 120 (H) 70 - 99 mg/dL    Comment: Glucose reference range applies only to samples taken after fasting for at least 8 hours.   BUN 23 8 - 23 mg/dL   Creatinine, Ser 1.40 (H) 0.61 - 1.24 mg/dL   Calcium 10.1 8.9 - 10.3 mg/dL   Total Protein 7.6 6.5 -  8.1 g/dL   Albumin 4.7 3.5 - 5.0 g/dL   AST 18 15 - 41 U/L   ALT 15 0 - 44 U/L   Alkaline Phosphatase 57 38 - 126 U/L   Total Bilirubin 0.5 0.3 - 1.2 mg/dL   GFR, Estimated 53 (L) >60 mL/min    Comment: (NOTE) Calculated using the CKD-EPI Creatinine Equation (2021)    Anion gap 14 5 - 15    Comment: Performed at [facility], [address], [place], [place] [address]  CBC with Differential     Status: Abnormal   Collection Time: 08/15/21  5:12 PM  Result Value Ref Range   WBC 16.1 (H) 4.0 - 10.5 K/uL   RBC 5.44 4.22 - 5.81 MIL/uL   Hemoglobin 14.8 13.0 - 17.0 g/dL   HCT 45.8 39.0 - 52.0 %   MCV 84.2 80.0 - 100.0 fL   MCH 27.2 26.0 - 34.0 pg   MCHC 32.3 30.0 - 36.0 g/dL   RDW 14.5 11.5 - 15.5 %   Platelets 196 150 - 400 K/uL   nRBC 0.0 0.0 - 0.2 %   Neutrophils Relative % 90 %   Neutro Abs 14.4 (H) 1.7 -  7.7 K/uL   Lymphocytes Relative 5 %   Lymphs Abs 0.8 0.7 - 4.0 K/uL   Monocytes Relative 5 %   Monocytes Absolute 0.7 0.1 - 1.0 K/uL   Eosinophils Relative 0 %   Eosinophils Absolute 0.0 0.0 - 0.5 K/uL   Basophils Relative 0 %   Basophils Absolute 0.0 0.0 - 0.1 K/uL   Immature Granulocytes 0 %   Abs Immature Granulocytes 0.07 0.00 - 0.07 K/uL    Comment: Performed at [facility], [place], [place] [address]  Troponin I (High Sensitivity)     Status: None   Collection Time: 08/15/21  5:12 PM  Result Value Ref Range   Troponin I (High Sensitivity) 7 <18 ng/L    Comment: (NOTE) Elevated high sensitivity troponin I (hsTnI) values and significant  changes across serial measurements may suggest ACS but many other  chronic and acute conditions are known to elevate hsTnI results.  Refer to the "Links" section for chest pain algorithms and additional  guidance. Performed at [facility], [address] [place], [place] [address]   Brain natriuretic peptide     Status: None   Collection Time:  08/15/21  5:12 PM  Result Value Ref Range   B Natriuretic Peptide 24.1 0.0 - 100.0 pg/mL    Comment: Performed at [facility], [address] [place],  [address]  Resp Panel by RT-PCR (Flu A&B, Covid) Nasopharyngeal Swab     Status: None   Collection Time: 08/15/21  5:12 PM   Specimen: Nasopharyngeal Swab; Nasopharyngeal(NP) swabs in vial transport medium  Result Value Ref Range   SARS Coronavirus 2 by RT PCR NEGATIVE NEGATIVE    Comment: (NOTE) SARS-CoV-2 target nucleic acids are NOT DETECTED.  The SARS-CoV-2 RNA is generally detectable in upper respiratory specimens during the acute phase of infection. The lowest concentration of SARS-CoV-2 viral copies this assay can detect is 138 copies/mL. A negative result does not preclude SARS-Cov-2 infection and should not be used as the sole basis for treatment or other patient management decisions. A negative result may occur with  improper specimen collection/handling, submission of specimen other than nasopharyngeal swab, presence of viral mutation(s) within the areas targeted by this assay, and inadequate number of viral copies(<138 copies/mL). A negative result must be combined with clinical observations, patient history, and epidemiological information. The expected result is Negative.  Fact Sheet for Patients:  EntrepreneurPulse.com.au  Fact Sheet for Healthcare Providers:  IncredibleEmployment.be  This test is no t yet approved or cleared by the [place] FDA and  has been authorized for detection and/or diagnosis of SARS-CoV-2 by FDA under an Emergency Use Authorization (EUA). This EUA will remain  in effect (meaning this test can be used) for the duration of the COVID-19 declaration under Section 564(b)(1) of the Act, 21 U.S.C.section 360bbb-3(b)(1), unless the authorization is terminated  or revoked sooner.       Influenza A by PCR NEGATIVE NEGATIVE    Influenza B by PCR NEGATIVE NEGATIVE    Comment: (NOTE) The Xpert Xpress SARS-CoV-2/FLU/RSV plus assay is intended as an aid in the diagnosis of influenza from Nasopharyngeal swab specimens and should not be used as a sole basis for treatment. Nasal washings and aspirates are unacceptable for Xpert Xpress SARS-CoV-2/FLU/RSV testing.  Fact Sheet for Patients: EntrepreneurPulse.com.au  Fact Sheet for Healthcare Providers: IncredibleEmployment.be  This test is not yet approved or cleared by the [place] FDA and has been authorized for detection and/or  diagnosis of SARS-CoV-2 by FDA under an Emergency Use Authorization (EUA). This EUA will remain in effect (meaning this test can be used) for the duration of the COVID-19 declaration under Section 564(b)(1) of the Act, 21 U.S.C. section 360bbb-3(b)(1), unless the authorization is terminated or revoked.  Performed at [facility], [address], [place], [place] [address]   Troponin I (High Sensitivity)     Status: None   Collection Time: 08/15/21  7:09 PM  Result Value Ref Range   Troponin I (High Sensitivity) 8 <18 ng/L    Comment: (NOTE) Elevated high sensitivity troponin I (hsTnI) values and significant  changes across serial measurements may suggest ACS but many other  chronic and acute conditions are known to elevate hsTnI results.  Refer to the "Links" section for chest pain algorithms and additional  guidance. Performed at [facility], [address], [place], [place] [address]   Comprehensive metabolic panel     Status: Abnormal   Collection Time: 08/16/21  3:07 AM  Result Value Ref Range   Sodium 143 135 - 145 mmol/L   Potassium 2.9 (L) 3.5 - 5.1 mmol/L   Chloride 104 98 - 111 mmol/L   CO2 28 22 - 32 mmol/L   Glucose, Bld 120 (H) 70 - 99 mg/dL    Comment: Glucose reference range applies only to samples taken after fasting for at least 8  hours.   BUN 17 8 - 23 mg/dL   Creatinine, Ser 1.58 (H) 0.61 - 1.24 mg/dL   Calcium 9.0 8.9 - 10.3 mg/dL   Total Protein 6.2 (L) 6.5 - 8.1 g/dL   Albumin 3.4 (L) 3.5 - 5.0 g/dL   AST 14 (L) 15 - 41 U/L   ALT 14 0 - 44 U/L   Alkaline Phosphatase 50 38 - 126 U/L   Total Bilirubin 0.8 0.3 - 1.2 mg/dL   GFR, Estimated 46 (L) >60 mL/min    Comment: (NOTE) Calculated using the CKD-EPI Creatinine Equation (2021)    Anion gap 11 5 - 15    Comment: Performed at [facility], [place] [address], [place], [place] [address]  CBC WITH DIFFERENTIAL     Status: Abnormal   Collection Time: 08/16/21  3:07 AM  Result Value Ref Range   WBC 14.2 (H) 4.0 - 10.5 K/uL   RBC 4.83 4.22 - 5.81 MIL/uL   Hemoglobin 13.8 13.0 - 17.0 g/dL   HCT 40.8 39.0 - 52.0 %   MCV 84.5 80.0 - 100.0 fL   MCH 28.6 26.0 - 34.0 pg   MCHC 33.8 30.0 - 36.0 g/dL   RDW 14.5 11.5 - 15.5 %   Platelets 209 150 - 400 K/uL   nRBC 0.0 0.0 - 0.2 %   Neutrophils Relative % 87 %   Neutro Abs 12.3 (H) 1.7 - 7.7 K/uL   Lymphocytes Relative 8 %   Lymphs Abs 1.2 0.7 - 4.0 K/uL   Monocytes Relative 5 %   Monocytes Absolute 0.7 0.1 - 1.0 K/uL   Eosinophils Relative 0 %   Eosinophils Absolute 0.0 0.0 - 0.5 K/uL   Basophils Relative 0 %   Basophils Absolute 0.0 0.0 - 0.1 K/uL   Immature Granulocytes 0 %   Abs Immature Granulocytes 0.04 0.00 - 0.07 K/uL    Comment: Performed at [facility] [address] [place], [place] [address]  HIV Antibody (routine testing w rflx)     Status: None   Collection Time:  08/16/21  3:07 AM  Result Value Ref Range   HIV Screen 4th Generation wRfx Non Reactive Non Reactive    Comment: Performed at [facility], [place] [address] [place], [place] [address]  Magnesium     Status: None   Collection Time: 08/16/21  3:07 AM  Result Value Ref Range   Magnesium 1.9 1.7 - 2.4 mg/dL    Comment: Performed at [facility] [address], [place], [place] [address]  Phosphorus     Status:  None   Collection Time: 08/16/21  3:07 AM  Result Value Ref Range   Phosphorus 4.4 2.5 - 4.6 mg/dL    Comment: Performed at [facility] [address] [place], [place] [address]    CT SOFT TISSUE NECK WO CONTRAST  Result Date: 08/16/2021 CLINICAL DATA:  Possible aspiration swallowing a pill. Epiglottitis or tonsillitis suspected EXAM: CT NECK WITHOUT CONTRAST TECHNIQUE: Multidetector CT imaging of the neck was performed following the standard protocol without intravenous contrast. RADIATION DOSE REDUCTION: This exam was performed according to the departmental dose-optimization program which includes automated exposure control, adjustment of the mA and/or kV according to patient size and/or use of iterative reconstruction technique. COMPARISON:  04/04/2021 CTA of the neck FINDINGS: Pharynx and larynx: Low-density thickening of the supraglottic larynx especially at the aryepiglottic folds and to a limited degree at the epiglottis. Partially closed glottis at time of imaging. No retropharyngeal collection. Prominent upper thoracic esophagus but no discrete mass or persistence on reformats. Stable appearance from prior chest CT. Salivary glands: No inflammation, mass, or stone. Thyroid: No worrisome finding. Lymph nodes: None enlarged or abnormal density. Vascular: Negative. Limited intracranial: Negative. Visualized orbits: Negative. Mastoids and visualized paranasal sinuses: Clear. Skeleton: No acute or aggressive process.  Cervical spondylosis Upper chest: No acute finding IMPRESSION: Submucosal edema in the supraglottic larynx. Electronically Signed   By: Jorje Guild M.D.   On: 08/16/2021 07:16   CT Angio Chest PE W/Cm &/Or Wo Cm  Result Date: 08/15/2021 CLINICAL DATA:  Tachycardia EXAM: CT ANGIOGRAPHY CHEST WITH CONTRAST TECHNIQUE: Multidetector CT imaging of the chest was performed using the standard protocol during bolus administration of intravenous contrast. Multiplanar CT image  reconstructions and MIPs were obtained to evaluate the vascular anatomy. RADIATION DOSE REDUCTION: This exam was performed according to the departmental dose-optimization program which includes automated exposure control, adjustment of the mA and/or kV according to patient size and/or use of iterative reconstruction technique. CONTRAST:  45mL OMNIPAQUE IOHEXOL 350 MG/ML SOLN COMPARISON:  Chest x-ray 08/15/2021, CT abdomen pelvis 08/15/2021 FINDINGS: Cardiovascular: Satisfactory opacification of the pulmonary arteries to the segmental level. No evidence of pulmonary embolism. Mild aortic atherosclerosis. No aneurysm. Normal cardiac size. No pericardial effusion Mediastinum/Nodes: Midline trachea. No suspicious thyroid nodule. No suspicious lymph nodes. Small hiatal hernia. Lungs/Pleura: No pleural effusion or pneumothorax. Mild nodularity and ground-glass density in the right lower lobe with debris within right lower lobe bronchi. Upper Abdomen: Partially visualized atrophic right kidney. Cysts upper pole left kidney Musculoskeletal: No acute osseous abnormality. Review of the MIP images confirms the above findings. IMPRESSION: 1. Negative for acute pulmonary embolus. 2. Interval finding of mildly nodular and ground-glass airspace disease in the right lung base with debris and or fluid within the bronchi, given clear lung bases on the CT performed earlier today, suspect that findings could be secondary to aspiration. Aortic Atherosclerosis (ICD10-I70.0). Electronically Signed   By: Donavan Foil M.D.   On: 08/15/2021 18:41  MR BRAIN WO CONTRAST  Result Date: 08/16/2021 CLINICAL DATA:  Acute neuro deficit.  Rule out stroke EXAM: MRI HEAD WITHOUT CONTRAST TECHNIQUE: Multiplanar, multiecho pulse sequences of the brain and surrounding structures were obtained without intravenous contrast. COMPARISON:  CT head 07/18/2021.  MRI head 04/05/2021 FINDINGS: Brain: Negative for acute infarct. Chronic infarct right  occipital lobe. Chronic microvascular ischemic change in the white matter. Chronic infarct in the pons and left middle cerebellar peduncle. Negative for hemorrhage or mass. Generalized atrophy. Vascular: Normal arterial flow voids. Skull and upper cervical spine: No focal skeletal lesion. Sinuses/Orbits: Mild mucosal edema paranasal sinuses. Bilateral cataract extraction Other: None IMPRESSION: Atrophy and chronic ischemic change. Negative for acute infarct. Electronically Signed   By: Franchot Gallo M.D.   On: 08/16/2021 11:25   DG Chest Port 1 View  Result Date: 08/15/2021 CLINICAL DATA:  Shortness of breath. EXAM: PORTABLE CHEST 1 VIEW COMPARISON:  Two-view chest x-ray 06/06/2021 FINDINGS: Heart size is normal. Atherosclerotic changes are again seen at the aortic arch. Chronic interstitial markings present. No edema or effusion is present. No focal airspace disease is present. Degenerative changes are noted in the shoulders. IMPRESSION: No acute cardiopulmonary disease or significant interval change. Electronically Signed   By: San Morelle M.D.   On: 08/15/2021 16:28   DG Abd Portable 1V  Result Date: 08/16/2021 CLINICAL DATA:  Feeding tube placement. EXAM: PORTABLE ABDOMEN - 1 VIEW COMPARISON:  None. FINDINGS: Feeding tube terminates near the pylorus. Visualized left lung base is clear. IMPRESSION: Pain tube terminates near the pylorus. Electronically Signed   By: Lorin Picket M.D.   On: 08/16/2021 16:25   DG Swallowing Func-Speech Pathology  Result Date: 08/16/2021 Table formatting from the original result was not included. Objective Swallowing Evaluation: Type of Study: MBS-Modified Barium Swallow Study  Patient Details Name: Nicholas Mckenzie MRN: [id number] Date of Birth: 07/13/1945 Today's Date: 08/16/2021 Time: SLP Start Time (ACUTE ONLY): 0810 -SLP Stop Time (ACUTE ONLY): 0845 SLP Time Calculation (min) (ACUTE ONLY): 35 min Past Medical History: Past Medical History: Diagnosis Date  CKD  (chronic kidney disease) stage 3, GFR 30-59 ml/min (HCC)   Hypertension   Kidney stones   Sleep apnea  Past Surgical History: Past Surgical History: Procedure Laterality Date  CATARACT EXTRACTION    IR NEPHROSTOMY EXCHANGE RIGHT  06/07/2021  IR NEPHROSTOMY PLACEMENT RIGHT  04/07/2021  IR PATIENT EVAL TECH 0-60 MINS  06/02/2021  kidney stent    MANDIBLE FRACTURE SURGERY   HPI: Pt is a 76 yo male adm to [facility] after concern for aspiration event with pill.  Pt CT chest showed "Interval finding of mildly nodular and ground-glass airspace  disease in the right lung base with debris and or fluid within the  bronchi, given clear lung bases on the CT performed earlier today,  suspect that findings could be secondary to aspiration."  Swallow eval ordered. 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  Pt reports he tried to swallow pill dry and this resulted in aspiration.  Per CT neck, pt has Submucosal edema in the supraglottic larynx.  MBS indicated.  Subjective: pt in bed, assist to slide into flouro chair  Recommendations for follow up therapy are one component of a multi-disciplinary discharge planning process, led by the attending physician.  Recommendations may be updated based  on patient status, additional functional criteria and insurance authorization. Assessment / Plan / Recommendation Clinical Impressions 08/16/2021 Clinical Impression Patient currently presents with severe pharyngo with suspected cervical esophageal dysphagia with sensorimotor deficits.  Minimally decreased oral control noted but primary deficit is pharyngeal.  Delayed swallow trigger to pyriform sinus with liquids as well as impaired tongue base retraction/airway closure allows aspiration of secretions, laryngeal  penetration and aspiration of all consistencies tested.  Pt without consistent sensation to aspiration and reflexive nor cued cough effectively cleared aspirates.  Various postures including chin tuck with and without head turn left and chair reclined to approx 45* did not completely prevent penetration/aspiration.  Chair reclined effective x1 with 1/2 tsp nectar only to prevent aspiration.  In addition, if pt cleared larynx, he repenetrated/aspirated due to pharyngeal retention.  SLP questions if pt's Submucosal edema in the supraglottic larynx may be negatively impacting laryngeal closure - contributing to his dysphagia.  Educated pt during testing using video feedback.  Pt with h/o dysphagia per his statement, with nasal regurgitation if eating too fast and a "little coughing" with po intake. Suspect some component of chronic dysphagia/aspiration due to prior CVA that pt has managed due to mobiilty, strength of cough, overall health, etc until this recent severe aspiration episode. At this time, due to pt report of severe worsening dysphagia,  recommend consider short term alternative means of nutrition and SLP follow up for dysphagia management.  Repeat MBS will be indicated prior to intiation of po diet due to pt's sensorimotor deficits.   SLP Visit Diagnosis Dysphagia, pharyngeal phase (R13.13);Dysphagia, unspecified (R13.10) Attention and concentration deficit following -- Frontal lobe and executive function deficit following -- Impact on safety and function Severe aspiration risk;Risk for inadequate nutrition/hydration   Treatment Recommendations 08/16/2021 Treatment Recommendations Therapy as outlined in treatment plan below   Prognosis 08/16/2021 Prognosis for Safe Diet Advancement Fair Barriers to Reach Goals Severity of deficits Barriers/Prognosis Comment -- Diet Recommendations 08/16/2021 SLP Diet Recommendations NPO;Ice chips PRN after oral care Liquid Administration via -- Medication Administration Via  alternative means Compensations Multiple dry swallows after each bite/sip;Other (Comment) Postural Changes --   Other Recommendations 08/16/2021 Recommended Consults -- Oral Care Recommendations Oral care QID Other Recommendations -- Follow Up Recommendations Home health SLP Assistance recommended at discharge Frequent or constant Supervision/Assistance Functional Status Assessment Patient has had a recent decline in their functional status and demonstrates the ability to make significant improvements in function in a reasonable and predictable amount of time. Frequency and Duration  08/16/2021 Speech Therapy Frequency (ACUTE ONLY) min 2x/week Treatment Duration 1 week   Oral Phase 08/16/2021 Oral Phase Impaired Oral - Pudding Teaspoon -- Oral - Pudding Cup -- Oral - Honey Teaspoon -- Oral - Honey Cup -- Oral - Nectar Teaspoon WFL Oral - Nectar Cup -- Oral - Nectar Straw -- Oral - Thin Teaspoon WFL Oral - Thin Cup -- Oral - Thin Straw WFL Oral - Puree WFL Oral - Mech Soft -- Oral - Regular -- Oral - Multi-Consistency -- Oral - Pill -- Oral Phase - Comment --  Pharyngeal Phase 08/16/2021 Pharyngeal Phase Impaired Pharyngeal- Pudding Teaspoon -- Pharyngeal -- Pharyngeal- Pudding Cup -- Pharyngeal -- Pharyngeal- Honey Teaspoon -- Pharyngeal -- Pharyngeal- Honey Cup -- Pharyngeal -- Pharyngeal- Nectar Teaspoon Delayed swallow initiation-pyriform sinuses;Penetration/Aspiration during swallow;Penetration/Apiration after swallow;Inter-arytenoid space residue;Lateral channel residue;Reduced pharyngeal peristalsis;Penetration/Aspiration before swallow;Reduced airway/laryngeal closure;Pharyngeal residue - valleculae;Reduced tongue base retraction;Reduced epiglottic inversion Pharyngeal Material enters airway, passes BELOW cords without attempt by patient to  eject out (silent aspiration);Material enters airway, passes BELOW cords and not ejected out despite cough attempt by patient Pharyngeal- Nectar Cup -- Pharyngeal --  Pharyngeal- Nectar Straw -- Pharyngeal -- Pharyngeal- Thin Teaspoon Reduced pharyngeal peristalsis;Moderate aspiration;Pharyngeal residue - pyriform;Pharyngeal residue - valleculae;Delayed swallow initiation-pyriform sinuses;Reduced airway/laryngeal closure Pharyngeal Material enters airway, passes BELOW cords without attempt by patient to eject out (silent aspiration) Pharyngeal- Thin Cup -- Pharyngeal -- Pharyngeal- Thin Straw Reduced pharyngeal peristalsis;Penetration/Aspiration during swallow;Penetration/Apiration after swallow;Significant aspiration (Amount);Inter-arytenoid space residue;Lateral channel residue;Reduced airway/laryngeal closure;Reduced epiglottic inversion;Reduced tongue base retraction;Pharyngeal residue - valleculae;Pharyngeal residue - pyriform Pharyngeal Material enters airway, passes BELOW cords without attempt by patient to eject out (silent aspiration);Material enters airway, passes BELOW cords and not ejected out despite cough attempt by patient Pharyngeal- Puree Lateral channel residue;Inter-arytenoid space residue;Penetration/Apiration after swallow;Reduced pharyngeal peristalsis;Penetration/Aspiration during swallow;Reduced airway/laryngeal closure;Reduced epiglottic inversion;Reduced tongue base retraction;Pharyngeal residue - valleculae Pharyngeal Material enters airway, passes BELOW cords without attempt by patient to eject out (silent aspiration);Material enters airway, passes BELOW cords and not ejected out despite cough attempt by patient Pharyngeal- Mechanical Soft -- Pharyngeal -- Pharyngeal- Regular -- Pharyngeal -- Pharyngeal- Multi-consistency -- Pharyngeal -- Pharyngeal- Pill -- Pharyngeal -- Pharyngeal Comment Chin tuck with and without head turn, reclined to approx 45* did not effectively protect airway.  Multiple swallows aids pharyngeal clearance.  Shallow pyriform sinus noted which allows spillage of barium into larynx/trachea before and postswallow.   Reflexive and  cued cough did not fully clear aspirates mixed with secretions.  Pt propelled some of pharyngeal retention into oral cavity and expectorated during MBS.  Aspiration occured with nectar before the swallow as barium spilled from pyriform sinus into open larynx before swallow.  Cervical Esophageal Phase  08/16/2021 Cervical Esophageal Phase Impaired Pudding Teaspoon -- Pudding Cup -- Honey Teaspoon -- Honey Cup -- Nectar Teaspoon -- Nectar Cup -- Nectar Straw -- Thin Teaspoon -- Thin Cup -- Thin Straw -- Puree -- Mechanical Soft -- Regular -- Multi-consistency -- Pill -- Cervical Esophageal Comment Suboptimal view due to pt's shoulders paritally blocking proximal esophagus; Upon esophageal sweep, pt appeared with minimal amount of barium that appeared mixed with secretions - radiologist not present to confirm. Nicholas Mckenzie 08/16/2021, 10:11 AM    Nicholas Lime, MS [facility] SLP Acute Rehab Services Office [phone] Pager [phone]                   ROS Blood pressure (!) 141/78, pulse 80, temperature 98.2 F (36.8 C), temperature source Oral, resp. rate 20, height 5\' 8"  (1.727 m), weight 93.6 kg, SpO2 95 %. Physical Exam HENT:     Head: Normocephalic.     Mouth/Throat:     Mouth: Mucous membranes are moist.  Eyes:     Pupils: Pupils are equal, round, and reactive to light.  Musculoskeletal:     Cervical back: Normal range of motion.  Neurological:     Mental Status: He is alert.      Assessment/Plan: Dysphagia/hoarseness- he has some edema of supraglottis and this may have put him pass his previously compensated stroke deficit. He does not want a scope right now which is ok. We can follow for a few days and see how he improves before the scoping. He as already been set up for a feeding tube.   Nicholas Mckenzie 08/16/2021, 5:37 PM

## 2021-08-16 NOTE — Assessment & Plan Note (Signed)
-  See Hx of Subdural Hematoma

## 2021-08-17 ENCOUNTER — Inpatient Hospital Stay (HOSPITAL_COMMUNITY): Payer: Medicare Other

## 2021-08-17 DIAGNOSIS — N200 Calculus of kidney: Secondary | ICD-10-CM

## 2021-08-17 DIAGNOSIS — Z8679 Personal history of other diseases of the circulatory system: Secondary | ICD-10-CM

## 2021-08-17 DIAGNOSIS — E669 Obesity, unspecified: Secondary | ICD-10-CM

## 2021-08-17 DIAGNOSIS — G4733 Obstructive sleep apnea (adult) (pediatric): Secondary | ICD-10-CM

## 2021-08-17 DIAGNOSIS — E440 Moderate protein-calorie malnutrition: Secondary | ICD-10-CM | POA: Insufficient documentation

## 2021-08-17 DIAGNOSIS — J029 Acute pharyngitis, unspecified: Secondary | ICD-10-CM

## 2021-08-17 DIAGNOSIS — N1831 Chronic kidney disease, stage 3a: Secondary | ICD-10-CM

## 2021-08-17 LAB — PHOSPHORUS
Phosphorus: 2.5 mg/dL (ref 2.5–4.6)
Phosphorus: 2.8 mg/dL (ref 2.5–4.6)

## 2021-08-17 LAB — CBC WITH DIFFERENTIAL/PLATELET
Abs Immature Granulocytes: 0.05 10*3/uL (ref 0.00–0.07)
Basophils Absolute: 0 10*3/uL (ref 0.0–0.1)
Basophils Relative: 0 %
Eosinophils Absolute: 0.1 10*3/uL (ref 0.0–0.5)
Eosinophils Relative: 0 %
HCT: 40.8 % (ref 39.0–52.0)
Hemoglobin: 13.2 g/dL (ref 13.0–17.0)
Immature Granulocytes: 0 %
Lymphocytes Relative: 13 %
Lymphs Abs: 1.5 10*3/uL (ref 0.7–4.0)
MCH: 28.3 pg (ref 26.0–34.0)
MCHC: 32.4 g/dL (ref 30.0–36.0)
MCV: 87.4 fL (ref 80.0–100.0)
Monocytes Absolute: 0.6 10*3/uL (ref 0.1–1.0)
Monocytes Relative: 6 %
Neutro Abs: 8.9 10*3/uL — ABNORMAL HIGH (ref 1.7–7.7)
Neutrophils Relative %: 81 %
Platelets: 189 10*3/uL (ref 150–400)
RBC: 4.67 MIL/uL (ref 4.22–5.81)
RDW: 14.7 % (ref 11.5–15.5)
WBC: 11.2 10*3/uL — ABNORMAL HIGH (ref 4.0–10.5)
nRBC: 0 % (ref 0.0–0.2)

## 2021-08-17 LAB — BASIC METABOLIC PANEL
Anion gap: 10 (ref 5–15)
BUN: 12 mg/dL (ref 8–23)
CO2: 28 mmol/L (ref 22–32)
Calcium: 9.1 mg/dL (ref 8.9–10.3)
Chloride: 105 mmol/L (ref 98–111)
Creatinine, Ser: 1.36 mg/dL — ABNORMAL HIGH (ref 0.61–1.24)
GFR, Estimated: 55 mL/min — ABNORMAL LOW (ref >60)
Glucose, Bld: 101 mg/dL — ABNORMAL HIGH (ref 70–99)
Potassium: 3.2 mmol/L — ABNORMAL LOW (ref 3.5–5.1)
Sodium: 143 mmol/L (ref 135–145)

## 2021-08-17 LAB — MAGNESIUM
Magnesium: 2.2 mg/dL (ref 1.7–2.4)
Magnesium: 2.1 mg/dL (ref 1.7–2.4)

## 2021-08-17 LAB — GLUCOSE, CAPILLARY
Glucose-Capillary: 115 mg/dL — ABNORMAL HIGH (ref 70–99)
Glucose-Capillary: 143 mg/dL — ABNORMAL HIGH (ref 70–99)

## 2021-08-17 IMAGING — DX DG ABD PORTABLE 1V
1 series · 1 of 1 positions shown · non-contrast
Comparison: [DATE]

CLINICAL DATA: NG tube placement

EXAM:
PORTABLE ABDOMEN - 1 VIEW

[abdomen supine]
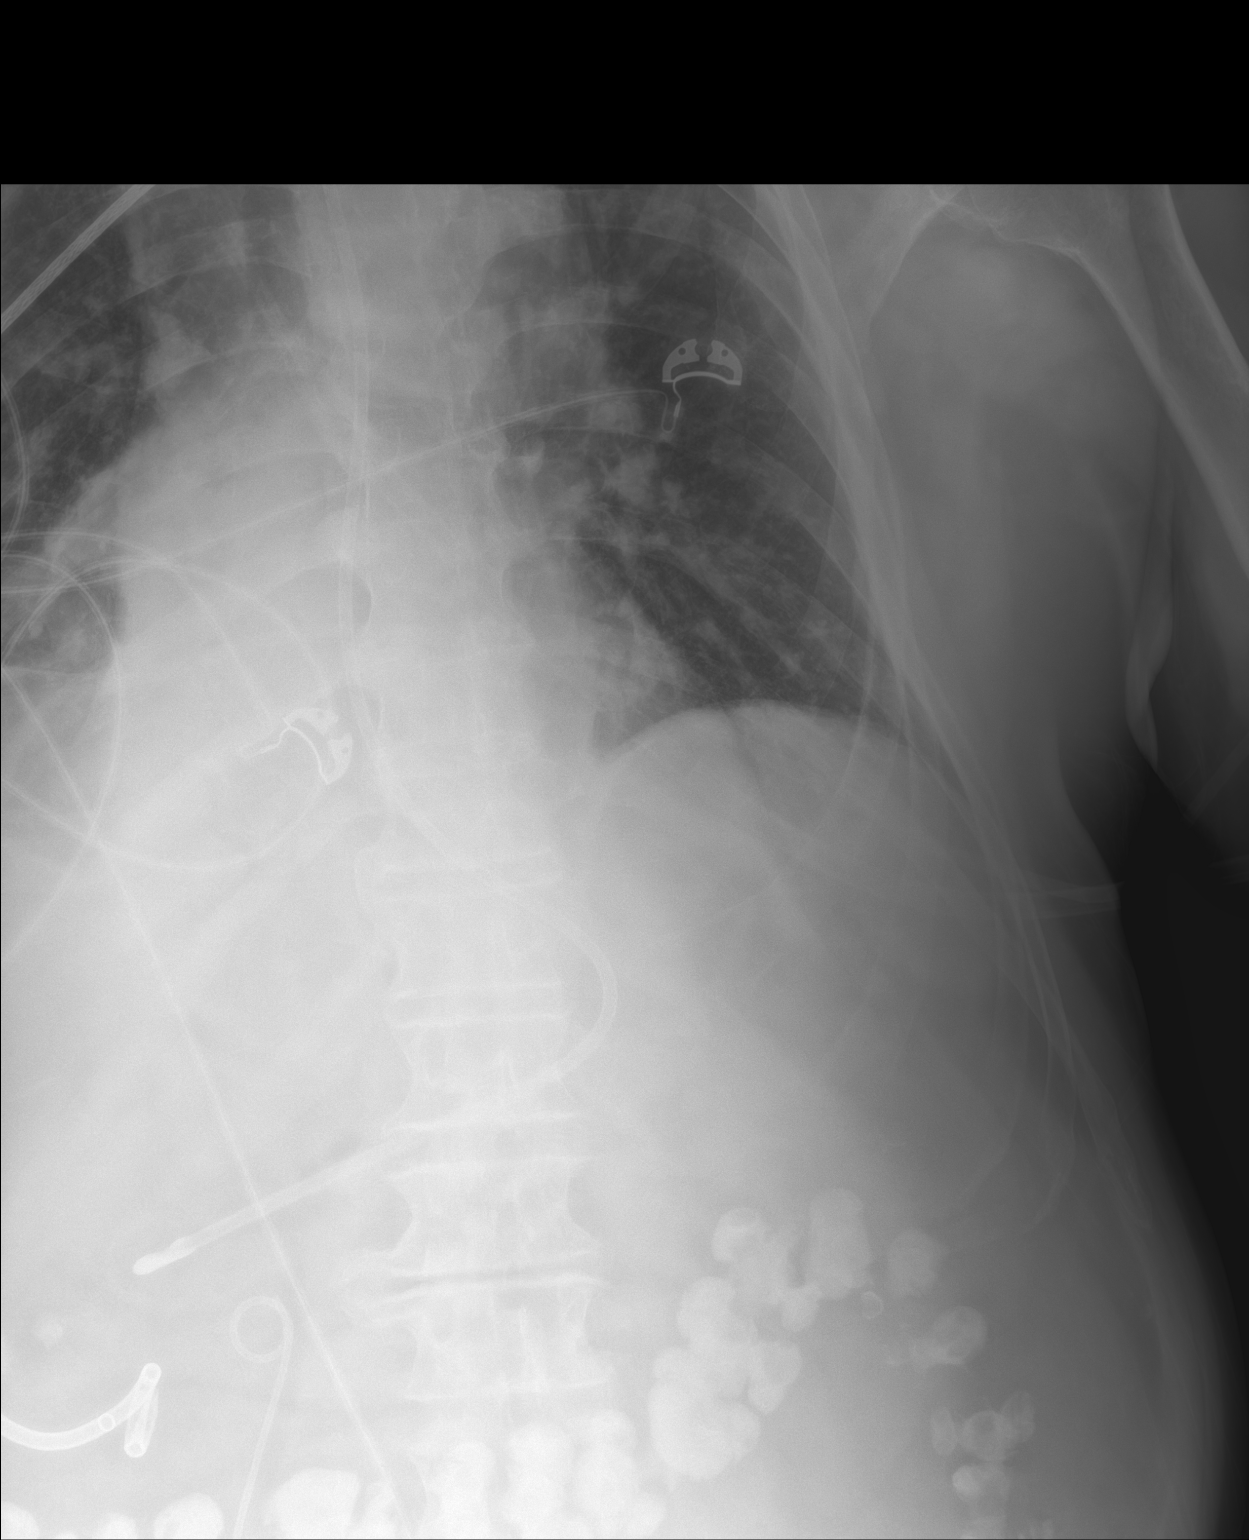

[1 of 1 positions shown; findings below may reference images not displayed]

FINDINGS: Tip of enteric tube is seen in the region of distal antrum/pylorus
of the stomach. There is percutaneous right nephrostomy catheter.
Right ureteral stent is seen. There is contrast in the lumen of
colon. Diverticula are seen in the colon. There is no significant
small bowel dilation.
IMPRESSION: Tip of enteric tube is seen in the region distal antrum/pylorus of
the stomach.

## 2021-08-17 MED ORDER — BUDESONIDE 0.25 MG/2ML IN SUSP
0.2500 mg | Freq: Two times a day (BID) | RESPIRATORY_TRACT | Status: DC
  Administered 2021-08-17 – 2021-08-24 (×14): 0.25 mg via RESPIRATORY_TRACT
  Filled 2021-08-17 (×19): qty 2

## 2021-08-17 MED ORDER — POTASSIUM CHLORIDE 20 MEQ PO PACK: 60.0000 meq | PACK | Freq: Once | ORAL | Status: DC

## 2021-08-17 MED ORDER — GUAIFENESIN 100 MG/5ML PO LIQD
15.0000 mL | ORAL | Status: DC | PRN
  Administered 2021-08-17 – 2021-08-20 (×5): 15 mL
  Filled 2021-08-17 (×6): qty 20

## 2021-08-17 MED ORDER — PNEUMOCOCCAL VAC POLYVALENT 25 MCG/0.5ML IJ INJ
0.5000 mL | INJECTION | INTRAMUSCULAR | Status: AC
  Administered 2021-08-21: 0.5 mL via INTRAMUSCULAR
  Filled 2021-08-17: qty 0.5

## 2021-08-17 MED ORDER — FREE WATER
100.0000 mL | Status: DC
  Administered 2021-08-17 – 2021-08-21 (×22): 100 mL

## 2021-08-17 MED ORDER — SODIUM CHLORIDE 0.9 % IV SOLN
3.0000 g | Freq: Four times a day (QID) | INTRAVENOUS | Status: DC
  Administered 2021-08-17 – 2021-08-25 (×33): 3 g via INTRAVENOUS
  Filled 2021-08-17 (×37): qty 8

## 2021-08-17 MED ORDER — LACTATED RINGERS IV SOLN: INTRAVENOUS | Status: AC

## 2021-08-17 MED ORDER — POTASSIUM PHOSPHATES 15 MMOLE/5ML IV SOLN
20.0000 mmol | Freq: Once | INTRAVENOUS | Status: AC
  Administered 2021-08-17: 20 mmol via INTRAVENOUS
  Filled 2021-08-17: qty 6.67

## 2021-08-17 MED ORDER — GUAIFENESIN ER 600 MG PO TB12
1200.0000 mg | ORAL_TABLET | Freq: Two times a day (BID) | ORAL | Status: DC
  Filled 2021-08-17: qty 2

## 2021-08-17 MED ORDER — IPRATROPIUM BROMIDE 0.02 % IN SOLN
0.5000 mg | Freq: Four times a day (QID) | RESPIRATORY_TRACT | Status: DC
  Administered 2021-08-17 – 2021-08-19 (×11): 0.5 mg via RESPIRATORY_TRACT
  Filled 2021-08-17 (×11): qty 2.5

## 2021-08-17 MED ORDER — PROSOURCE TF PO LIQD
45.0000 mL | Freq: Every day | ORAL | Status: DC
Start: 2021-08-17 — End: 2021-08-23
  Administered 2021-08-17 – 2021-08-23 (×7): 45 mL
  Filled 2021-08-17 (×7): qty 45

## 2021-08-17 MED ORDER — OSMOLITE 1.5 CAL PO LIQD
1000.0000 mL | ORAL | Status: DC
  Administered 2021-08-17 – 2021-08-23 (×7): 1000 mL
  Filled 2021-08-17 (×10): qty 1000

## 2021-08-17 MED ORDER — POLYVINYL ALCOHOL 1.4 % OP SOLN
1.0000 [drp] | OPHTHALMIC | Status: DC | PRN
  Administered 2021-08-17: 1 [drp] via OPHTHALMIC
  Filled 2021-08-17: qty 15

## 2021-08-17 MED ORDER — LEVALBUTEROL HCL 0.63 MG/3ML IN NEBU
0.6300 mg | INHALATION_SOLUTION | Freq: Four times a day (QID) | RESPIRATORY_TRACT | Status: DC
Start: 2021-08-17 — End: 2021-08-19
  Administered 2021-08-17 – 2021-08-19 (×11): 0.63 mg via RESPIRATORY_TRACT
  Filled 2021-08-17 (×11): qty 3

## 2021-08-17 NOTE — Progress Notes (Signed)
Flutter CPT don't done at this time. Patient is sleeping.

## 2021-08-17 NOTE — Progress Notes (Signed)
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  He was n.p.o. prior to the scan but decided to take his Prozac pill before going to scan did not use water and he felt like he choked on the pill.  He started having some mild trouble breathing and elevated blood pressure but decided to proceed with a CT scan anyway and then went home and then took his Norvasc and drink some juice.  He started having more trouble throughout the day with his breathing and I brought him to the ED and he was noted to be hypertensive, tachycardic and hypoxic.  Subsequently the patient had likely aspirated his pills and had to be placed on supplemental oxygen via nasal cannula and improved.  He had to be placed on 13 L of supplemental oxygen and when he came to the ED, he underwent a CT scan of the chest which showed aspiration findings.  He was admitted to the stepdown progressive unit and admitted for his hypoxia after aspiration.  Patient became extremely short of breath and began having a persistent cough and patient's wife has noticed that his voice is become more of a whisper.  Patient also complained of sore throat.  In the ED he was hypoxic requiring at least 13 L of supplemental oxygen which was then weaned to 8 L.  CTA of the chest was negative for PE but did show findings concerning for  aspiration.  His labs did show leukocytosis and patient was febrile with a temperature of 102 and a COVID test was negative.  He was initially on empiric antibiotics and admitted for further work-up and admitted for sepsis secondary to aspiration pneumonia.  **Interim History ENT was consulted for the edema of the supraglottis and they recommended initially evaluating with a scope but patient did not want to do this so they are holding off for now.  Given slightly worsening pulmonary status overnight ENT and no new recommendations recommended continue n.p.o. for now.  Overnight the patient appeared more short of breath and had a change in consistency and inquiry of his sputum.  He is no longer bringing up large amounts of thick copious green sputum and his chest x-ray revealed substantial increase in his right basilar opacity compared to his prior chest x-ray.  His antibiotics were changed to Unasyn and his tube feedings were discontinued given that there is concern for ongoing aspiration due to malfunctioning tube feeding leading to worsening aspiration.  His tube feeds were resumed today after his Cortrak was unclogged.  He is now off of supplemental oxygen this morning when I saw him and his respiratory status is doing a little bit better.  Assessment and Plan: * Acute respiratory failure with hypoxia ([place])- (present on admission) -In the setting of aspiration event and aspiration pneumonia -Patient aspirated on his Protonix pill and became extremely short of breath going on for a CT scan  for his last urology evaluation. -Initiated on empiric antibiotics with IV ceftriaxone and azithromycin which we will continue but this was changed to IV Unasyn given his worsening respiratory status -Continue treatment as below delineated in sepsis and aspiration pneumonia -SLP evaluated and recommending n.p.o. except ice chips -Given that he is n.p.o. we will place a small bore Cortrak for medication administration  and tube feedings -Continue with speech therapy -SpO2: 95 % O2 Flow Rate (L/min): 2 L/min -Continuous pulse oximetry and maintain O2 saturations greater than 90% -Continue supplemental oxygen via nasal cannula wean O2 as tolerated -Patient's voice has become more whisper so a CT scan of the neck soft tissue was done and showed "Submucosal edema in the supraglottic larynx." -Repeat chest x-ray as below -Will discuss with ENT about CT Soft Tissue Neck Findings and Dr. Janace Hoard will come assess the patient and he had recommended scope and the patient the patient refused at this time and will continue monitor patient's respiratory status carefully and then consider scoping in a few days.    Malnutrition of moderate degree Nutrition Status: Nutrition Problem: Moderate Malnutrition (in the context of social/environmental circumstances) Etiology: dysphagia Signs/Symptoms: mild muscle depletion, mild fat depletion, percent weight loss (10.3% x 6 months) Percent weight loss: 10.3 % Interventions: Refer to RD note for recommendations     OSA (obstructive sleep apnea) -C/w CPAP qHS    Obesity (BMI 0000000) -Complicates overall prognosis and care -Estimated body mass index is 31.38 kg/m as calculated from the following:   Height as of this encounter: 5\' 8"  (1.727 m).   Weight as of this encounter: 93.6 kg.  -Weight Loss and Dietary Counseling given  Stage 3a chronic kidney disease (CKD) (HCC) -Patient's BUN/creatinine is currently at baseline slightly worsened -BUN/creatinine went from 23/1.40 is now 17/1.58 yesterday and today is slightly improved at 12/1.36 -Avoid further nephrotoxic medications, consciousness, hypotension renally dose medications -Repeat CMP in the a.m.  Nephrolithiasis -Plan was to have a nephrectomy later this week -Patient sees Dr. Tresa Moore of Urology as an outpatient -CT Renal Stone study done as an outpatient which we need to get Results  -Will notify Dr. Tresa Moore that  patient is currently hospitalized with Aspiration as a courtesy   Hx of subdural hematoma -Was presently obtaining CT in anticipation for a right-sided nephrectomy  later this week however he aspirated un the CT Scanner -Given concern for worsening weakness compared to his baseline we have ordered a MRI which showed no acute CVA -PT/OT to evaluate and Treat   Sore throat -CT Neck as Above  -CT of the neck done and showed "Submucosal edema in the supraglottic larynx." -ENT to Evaluate and appreciate Dr. Janace Hoard evaluating.  Dr. Janace Hoard had recommended a scope but patient has initially refused.  They recommend continuing to follow to see if the patient proves for scoping.  His hoarseness is starting to improve a little bit and he continues to have no stridor or airway noise. -ENT recommends continuing n.p.o. for now  Severe sepsis ([place]) -Patient aspirated yesterday and presented with fever, tachycardia, tachypnea and a source of infection with aspiration findings -Can continue empiric antibiotics with IV ceftriaxone and azithromycin and will obtain sputum cultures; overnight his respiratory status worsened so his antibiotics were changed to Unasyn -SLP done and recommending n.p.o. for now -Blood cultures not obtained on admission for unclear reasons so we will obtain now -Respiratory panel done by PCR showed negative influenza A and negative influenza B as well as SARS-CoV-2 testing  negative by PCR -CTA of the chest PE protocol done and showed "Negative for acute pulmonary embolus. Interval finding of mildly nodular and ground-glass airspace disease in the right lung base with debris and or fluid within the bronchi, given clear lung bases on the CT performed earlier today, suspect that findings could be secondary to aspiration." -Repeat chest x-ray done and showed "Likely trace right pleural effusion.Interval increase in right base patchy airspace opacity. Finding could represent a combination of  infection/inflammation versus atelectasis". -Will add Xopenex and Atrovent scheduled as well as flutter valve, incentive spirometer and guaifenesin via the PEG -Budesonide 0.25 mg nebs twice daily and continue monitor his respiratory status carefully -Patient was initiated on lactated Ringer's at 100 MLS per hour for 24 hours early this a.m. but will reduce the rate to 75 MLS and only continue for 12 more hours -Given his generalized weakness and worsening swallow function MRI was done to evaluate for CVA and this was negative -We will do sepsis complete work-up and obtain urinalysis however this may be invalid given that he is on antibiotics already -Continue to follow cultures; blood cultures x2 showed no growth to date in less than 24 hours -WBC went from 16.1 -> 14.2 and is now improved to 11.2 -NG Cortrak is being placed in the inability to swallow; will continue n.p.o. for now -Follow chest x-rays and if not improving may consult pulmonary for further evaluation recommendations    Aspiration pneumonia ([place])- (present on admission) -Patient aspirated yesterday and presented with fever, tachycardia, tachypnea and a source of infection with aspiration findings -Can continue empiric antibiotics with IV ceftriaxone and azithromycin and will obtain sputum cultures and this was changed to Unasyn -SLP done and recommending n.p.o. for now -Blood cultures not obtained on admission for unclear reasons so we will obtain now -Respiratory panel done by PCR showed negative influenza A and negative influenza B as well as SARS-CoV-2 testing negative by PCR -CTA of the chest PE protocol done and showed "Negative for acute pulmonary embolus. Interval finding of mildly nodular and ground-glass airspace disease in the right lung base with debris and or fluid within the bronchi, given clear lung bases on the CT performed earlier today, suspect that findings could be secondary to aspiration." -Repeat CXR done and  showed "Likely trace right pleural effusion. Interval increase in right base patchy airspace opacity. Finding could represent a combination of infection/inflammation versus atelectasis." -Given his generalized weakness and worsening swallow function MRI was done to evaluate for CVA and this was negative -Started patient on Xopenex and Atrovent as well as Pulmicort.  Also placed on guaifenesin via tube as well as flutter valve incentive spirometry -We will do sepsis complete work-up and obtain urinalysis however this may be invalid given that he is on antibiotics already -Continue to follow cultures;  -WBC went from 16.1 -> 14.2 -> 11.2 -NG Cortrak is being placed in the inability to swallow -If not improving will consult Pulmonary and repeat chest x-ray in a.m.  H/O ischemic right PCA stroke -See Hx of Subdural Hematoma  Hypokalemia- (present on admission) -Patient's K+ went from 3.3 -> 2.9 -> 3.4 -Mag Level was 2.2 -Replete with po Kcl 40 mEQ via PEG and IV K Phos 20 mmol -Continue to Monitor and Replete as Necessary -Repeat CMP in the AM   Hypertension- (present on admission) -Continue n.p.o. and will initiate amlodipine through small bore feeding tube -Continue to monitor blood pressures per protocol -Last BP was 162/69  CKD (  chronic kidney disease) stage 3a- (present on admission) -Patient's BUNs/creatinine is relatively stable last few days and is now 12/1.36 -Currently getting IV fluid hydration but will reduce to 75 MLS per hour.  -Avoid further nephrotoxic medications, contrast dyes, hypotension renally dose medications -Repeat CMP in a.m.   Nutrition Documentation    [place] [facility] to Hosp-Admission (Current) from 08/15/2021 in [facility]  Nutrition Problem Moderate Malnutrition  [in the context of social/environmental circumstances]  Etiology dysphagia  Nutrition Goal Patient will meet greater than or equal to 90% of their needs  Interventions Refer  to RD note for recommendations      Subjective: Seen and examined at bedside and thinks he was doing better but states that he is not able to cough up the sputum and wanted "suctioning".  No nausea or vomiting.  Had worsening respiratory status last night but is improved today and is off oxygen when I saw him and saturating 97%.  Physical Exam: Vitals:   08/17/21 1138 08/17/21 1253 08/17/21 1521 08/17/21 1531  BP: (!) 172/88 (!) 154/72  (!) 162/69  Pulse: 85 89 84   Resp: 19 19 11 20   Temp: 98.2 F (36.8 C) 98.3 F (36.8 C)  98.8 F (37.1 C)  TempSrc: Axillary Axillary  Axillary  SpO2: 95% 95% 95% 95%  Weight:      Height:       Examination: Physical Exam:  Constitutional: WN/WD, NAD and appears calm and comfortable Eyes: PERRL, lids and conjunctivae normal, sclerae anicteric  ENMT: External Ears, Nose appear normal. Grossly normal hearing. Mucous membranes are moist. Posterior pharynx clear of any exudate or lesions. Normal dentition.  Neck: Appears normal, supple, no cervical masses, normal ROM, no appreciable thyromegaly Respiratory: Clear to auscultation bilaterally, no wheezing, rales, rhonchi or crackles. Normal respiratory effort and patient is not tachypenic. No accessory muscle use.  Cardiovascular: RRR, no murmurs / rubs / gallops. S1 and S2 auscultated. No extremity edema. 2+ pedal pulses. No carotid bruits.  Abdomen: Soft, non-tender, non-distended. No masses palpated. No appreciable hepatosplenomegaly. Bowel sounds positive.  GU: Deferred. Musculoskeletal: No clubbing / cyanosis of digits/nails. No joint deformity upper and lower extremities. Good ROM, no contractures. Normal strength and muscle tone.  Skin: No rashes, lesions, ulcers. No induration; Warm and dry.  Neurologic: CN 2-12 grossly intact with no focal deficits. Sensation intact in all 4 Extremities, DTR normal. Strength 5/5 in all 4. Romberg sign cerebellar reflexes not assessed.  Psychiatric: Normal  judgment and insight. Alert and oriented x 3. Normal mood and appropriate affect.    Data Reviewed:  I have independently reviewed and interpreted the patient's CBC, BMP and mag and Phos as well as looked at his repeat chest x-ray  Repeat lab work today shows that he has a potassium of 3.2 and his BUN/creatinine is now 12/1.36 and trended down.  His WBC is now 11.2  Family Communication: Discussed with Wife [name] over the Phone  Disposition: Status is: Inpatient Remains inpatient appropriate because: He will need further improvement in his respiratory status  Planned Discharge Destination:  Pending PT and OT evaluations  VTE prophylaxis: Enoxaparin 40 mg subcu every 24   Author: Raiford Noble, DO [facility] 08/17/2021 5:18 PM  For on call review [email or web address].

## 2021-08-17 NOTE — Evaluation (Signed)
SLP Cancellation Note  Patient Details Name: Nicholas Mckenzie MRN: [id number] DOB: 1946/04/12   Cancelled treatment:       Reason Eval/Treat Not Completed: Other (comment);Medical issues which prohibited therapy (Events from over night noted, RN reports pt is finally resting after respiratory events overnight. Weak cough mechanics rerported. Appreciate ENT consult.)  Posted sign above bed for [facility] not lower than 30*.    Chales Abrahams 08/17/2021, 8:24 AM   Rolena Infante, MS [facility] SLP Acute Rehab Services Office [phone] Pager [phone]

## 2021-08-17 NOTE — Assessment & Plan Note (Addendum)
Nutrition Status: Nutrition Problem: Moderate Malnutrition (in the context of social/environmental circumstances) Etiology: dysphagia Signs/Symptoms: mild muscle depletion, mild fat depletion, percent weight loss (10.3% x 6 months) Percent weight loss: 10.3 % Interventions: Refer to RD note for recommendations -Nutritionist Consulted and appreciate Recc's -Patient seen by speech therapy, underwent MBS, currently tolerating a dysphagia 2 diet. -Outpatient follow-up with SLP.

## 2021-08-17 NOTE — Progress Notes (Signed)
Wife is at bedside. Brought hearing aids, Neurosurgeon with charger, mouthwash and corresponding spoon, face spray, and non Vaseline chapstick. Also brought pair of pants, shirt, socks, and depends brief. Patient labels have been applied. All of which have been put in the patient's closet.

## 2021-08-17 NOTE — Evaluation (Signed)
Physical Therapy Evaluation Patient Details Name: Nicholas Mckenzie MRN: [id number] DOB: 1946/06/06 Today's Date: 08/17/2021  History of Present Illness  76 y.o. male admitted 2/21 with SOB and tachycardia with possibly choking on pill. Pt with acute respiratory failure. PMhx. 06/06/2021 fall with SDH, CKD stage IIIa, nephrostomy tube, sleep apnea, HTN, OSA, depression/anxiety, CVA with residual left hemiparesis.  Clinical Impression  Pt pleasant with cortrak not connected and contents leaking on arrival with pt eager to change gown. Pt on 6L on arrival with sats 100%. Transition to 2L during session with pt maintaining 97% and at rest on RA 95% with pt left on RA. Pt with decreased strength, balance and function who will benefit from acute therapy to maximize mobility, safety and independence.        Recommendations for follow up therapy are one component of a multi-disciplinary discharge planning process, led by the attending physician.  Recommendations may be updated based on patient status, additional functional criteria and insurance authorization.  Follow Up Recommendations Home health PT (resume current HHPT)    Assistance Recommended at Discharge Intermittent Supervision/Assistance  Patient can return home with the following  A little help with bathing/dressing/bathroom;A little help with walking and/or transfers;Assist for transportation;Direct supervision/assist for financial management;Direct supervision/assist for medications management    Equipment Recommendations None recommended by PT  Recommendations for Other Services       Functional Status Assessment Patient has had a recent decline in their functional status and demonstrates the ability to make significant improvements in function in a reasonable and predictable amount of time.     Precautions / Restrictions Precautions Precautions: Fall Precaution Comments: cortrak, nephrostomy tube Restrictions Weight Bearing  Restrictions: No      Mobility  Bed Mobility Overal bed mobility: Needs Assistance Bed Mobility: Supine to Sit     Supine to sit: Supervision     General bed mobility comments: increased time with use of rail to pivot to left side of bed, supervision for lines, HOB 15 degrees    Transfers Overall transfer level: Needs assistance   Transfers: Sit to/from Stand Sit to Stand: Min guard           General transfer comment: cues for hand placement and backing fully to surface to sit    Ambulation/Gait Ambulation/Gait assistance: Min assist Gait Distance (Feet): 150 Feet Assistive device: Rolling walker (2 wheels) Gait Pattern/deviations: Step-through pattern, Decreased stride length, Decreased stance time - left   Gait velocity interpretation: <1.8 ft/sec, indicate of risk for recurrent falls   General Gait Details: pt with decreased stance on left and difficulty controlling RW with tendency to veer rt given LUE weakness. Min assist to direct and control RW with assist for lines and cues for direction  Stairs            Wheelchair Mobility    Modified Rankin (Stroke Patients Only)       Balance Overall balance assessment: Needs assistance Sitting-balance support: No upper extremity supported, Feet supported Sitting balance-Leahy Scale: Fair     Standing balance support: Bilateral upper extremity supported Standing balance-Leahy Scale: Poor Standing balance comment: RW for standing and gait                             Pertinent Vitals/Pain Pain Assessment Pain Assessment: No/denies pain    Home Living Family/patient expects to be discharged to:: Private residence Living Arrangements: Spouse/significant other Available Help at Discharge: Family;Available 24 hours/day  Type of Home: House Home Access: Ramped entrance       Home Layout: One level Home Equipment: Conservation officer, nature (2 wheels);BSC/3in1;Shower seat;Cane - single point       Prior Function Prior Level of Function : Needs assist             Mobility Comments: pt ambulates household distances with use of walker ADLs Comments: pt reports he performs ADLs without assist     Hand Dominance        Extremity/Trunk Assessment   Upper Extremity Assessment Upper Extremity Assessment: Generalized weakness (baseline LUE hemiparesis)    Lower Extremity Assessment Lower Extremity Assessment: Generalized weakness    Cervical / Trunk Assessment Cervical / Trunk Assessment: Other exceptions Cervical / Trunk Exceptions: rounded shoulders  Communication   Communication: No difficulties (pt soft spoken and reports it hurts his throat to talk)  Cognition Arousal/Alertness: Awake/alert Behavior During Therapy: WFL for tasks assessed/performed Overall Cognitive Status: No family/caregiver present to determine baseline cognitive functioning Area of Impairment: Memory                     Memory: Decreased short-term memory         General Comments: pt with varied response to PLOF during eval        General Comments      Exercises     Assessment/Plan    PT Assessment Patient needs continued PT services  PT Problem List Decreased mobility;Decreased strength;Decreased activity tolerance;Decreased balance;Decreased knowledge of use of DME;Decreased cognition       PT Treatment Interventions Gait training;Balance training;Functional mobility training;Therapeutic activities;Patient/family education;Cognitive remediation;Therapeutic exercise;DME instruction    PT Goals (Current goals can be found in the Care Plan section)  Acute Rehab PT Goals Patient Stated Goal: return home PT Goal Formulation: With patient Time For Goal Achievement: 08/31/21 Potential to Achieve Goals: Fair    Frequency Min 3X/week     Co-evaluation               AM-PAC PT "6 Clicks" Mobility  Outcome Measure Help needed turning from your back to your side  while in a flat bed without using bedrails?: A Little Help needed moving from lying on your back to sitting on the side of a flat bed without using bedrails?: A Little Help needed moving to and from a bed to a chair (including a wheelchair)?: A Little Help needed standing up from a chair using your arms (e.g., wheelchair or bedside chair)?: A Little Help needed to walk in hospital room?: A Little Help needed climbing 3-5 steps with a railing? : A Lot 6 Click Score: 17    End of Session Equipment Utilized During Treatment: Gait belt Activity Tolerance: Patient tolerated treatment well Patient left: in chair;with call bell/phone within reach;with nursing/sitter in room;with chair alarm set Nurse Communication: Mobility status PT Visit Diagnosis: Other abnormalities of gait and mobility (R26.89);Difficulty in walking, not elsewhere classified (R26.2);Muscle weakness (generalized) (M62.81)    Time: [id number] PT Time Calculation (min) (ACUTE ONLY): 28 min   Charges:   PT Evaluation $PT Eval Moderate Complexity: 1 Mod PT Treatments $Gait Training: 8-22 mins        Constantinos Krempasky P, PT Acute Rehabilitation Services Pager: [phone] Office: [phone]   Deny Chevez B Donavin Audino 08/17/2021, 10:15 AM

## 2021-08-17 NOTE — Hospital Course (Addendum)
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  He was n.p.o. prior to the scan but decided to take his Prozac pill before going to scan did not use water and he felt like he choked on the pill.  He started having some mild trouble breathing and elevated blood pressure but decided to proceed with a CT scan anyway and then went home and then took his Norvasc and drink some juice.  He started having more trouble throughout the day with his breathing and I brought him to the ED and he was noted to be hypertensive, tachycardic and hypoxic.  Subsequently the patient had likely aspirated his pills and had to be placed on supplemental oxygen via nasal cannula and improved.  He had to be placed on 13 L of supplemental oxygen and when he came to the ED, he underwent a CT scan of the chest which showed aspiration findings.  He was admitted to the stepdown progressive unit and admitted for his hypoxia after aspiration.  Patient became extremely short of breath and began having a persistent cough and patient's wife has noticed that his voice is become more of a whisper.  Patient also complained of sore throat.  In the ED he was hypoxic requiring at least 13 L of supplemental oxygen which was then weaned to 8 L.  CTA of the chest was negative for PE but did show findings concerning for aspiration.  His labs did show leukocytosis and patient was febrile with a temperature of 102 and a COVID test was negative.  He was initially on empiric antibiotics and admitted  for further work-up and admitted for sepsis secondary to aspiration pneumonia.  **Interim History ENT was consulted for the edema of the supraglottis and they recommended initially evaluating with a scope but patient did not want to do this so they are holding off for now.  Given slightly worsening pulmonary status overnight ENT and no new recommendations recommended continue n.p.o. for now.  Overnight the patient appeared more short of breath and had a change in consistency and inquiry of his sputum.  He is no longer bringing up large amounts of thick copious green sputum and his chest x-ray revealed substantial increase in his right basilar opacity compared to his prior chest x-ray.  His antibiotics were changed to Unasyn and his tube feedings were discontinued given that there is concern for ongoing aspiration due to malfunctioning tube feeding leading to worsening aspiration.  His tube feeds were resumed today after his Cortrak was unclogged.  He continues to be intermittently on and off oxygen But his respiratory status is relatively stable  He has been intermittently somnolent he day before he was more somnolent as he did not have a good night's rest. 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 SLP continues to recommend NPO.   Urology was consulted and patient had Neph tube 06/07/21. His Right Neph Tube was accidentally pulled overnight so we will  consult IR for replacement.  Will continue to monitor renal function as it is slowly trending upwards and is now 22/1.33  Continue to monitor his respiratory status carefully and repeat chest x-ray this morning shows some interval improvement.  Will obtain repeat SLP evaluation to assess his swallowing and the plan is to pull a core track in the morning and do an MBS and then replace if necessary.

## 2021-08-17 NOTE — Progress Notes (Signed)
Patient ID: Nicholas Mckenzie, male   DOB: February 16, 1946, 76 y.o.   MRN: [id number] He has worsening pulmonary status. His voice is about same or slightly better with less mucus sound. No stridor or airway noise.   No new recommendations for now. Continue NPO.

## 2021-08-17 NOTE — Assessment & Plan Note (Addendum)
-  Patient's BUNs/creatinine is relatively stable last few days and is now 12/1.36 -> 12/1.27 -> 12/1.20 -> 16/1.15 -> 19/1.26 -> 22/1.33>>> 23/1.51 -IVF  stopped initially however due to hypernatremia patient placed back on gentle hydration.   -Slight bump in creatinine as such gentle hydration was continued for another 24 hours.   -Renal function stabilized.   -Avoided further nephrotoxic medications, contrast dyes, hypotension renally dose medications.

## 2021-08-17 NOTE — Progress Notes (Signed)
HOSPITAL MEDICINE OVERNIGHT EVENT NOTE    Notified by nursing that patient appears to be short of breath and seems to have developed a change in the consistency and quantity of sputum.  Nursing reports that patient is no longer bringing up large amounts of thick copious green sputum anymore and rather it is now scant amounts of white sputum.    Of note, patient is not exhibiting any increased oxygen requirement compared to earlier today.  I have also discussed this patient's presentation with respiratory.  Respiratory is currently providing chest physiotherapy as well as a flutter valve to assist in continuing to mobilize secretions.    Due to concerns by nursing over patient's apparent shortness of breath will obtain chest x-ray, consider additional therapies if there is any radiographic evidence of new pulmonary disease.  Nicholas Elk  MD [facility]   ADDENDUM 2/23 4:30am  Chest x-ray reviewed revealing developing substantial increase in right basilar opacity compared to prior chest x-ray 48 hours prior.  Per my repeat discussion with nursing they also note that the patient's coretrak has been malfunctioning throughout the evening.  This brings about concern that the patient may be developing ongoing aspiration due to malfunctioning feeding tube leading to worsening aspiration pneumonia.  Discontinuing tube feeds for now.  Nursing to contact core track team in the morning for evaluation and likely replacement of this device.  In the meantime we will transition patient from ceftriaxone to Unasyn.  Continue to monitor closely along with continued supplemental oxygen, PT and see and flutter valve therapy.  Nicholas Mckenzie

## 2021-08-17 NOTE — Progress Notes (Signed)
Pharmacy Antibiotic Note  Nicholas Mckenzie is a 76 y.o. male admitted on 08/15/2021 with aspiration pneumonia.  Pharmacy has been consulted for Unasyn dosing.  Plan: Unasyn 3g IV Q6H.  Height: 5\' 8"  (172.7 cm) Weight: 93.6 kg (206 lb 5.6 oz) IBW/kg (Calculated) : 68.4  Temp (24hrs), Avg:98.9 F (37.2 C), Min:98.2 F (36.8 C), Max:99.5 F (37.5 C)  Recent Labs  Lab 08/15/21 1712 08/16/21 0307  WBC 16.1* 14.2*  CREATININE 1.40* 1.58*    Estimated Creatinine Clearance: 45.5 mL/min (A) (by C-G formula based on SCr of 1.58 mg/dL (H)).    Allergies  Allergen Reactions   Statins Other (See Comments)    Pt reports severe weakness and mobility issues with his med, as well as diarrhea    Thank you for allowing pharmacy to be a part of this patients care.  Wynona Neat, PharmD, BCPS  08/17/2021 5:02 AM

## 2021-08-17 NOTE — Progress Notes (Signed)
Brief Nutrition Note  Cortrak tube became clogged overnight and tube feed regimen was discontinued after infusing at a trickle or 79mL/h for a few hours.   Spoke with RN this AM about clog who was able to get tube functioning and flushing. Imaging confirmed tube was still in good position. Discussed with MD - ok to restart TF.   Also discussed drop seen in phosphorus labs and low K this AM which could be the result of refeeding. Will continue to monitor. Both to be supplemented. Will continue to monitor for signs of tolerance to TF.  Greig Castilla, RD, LDN Clinical Dietitian RD pager # available in [facility]  After hours/weekend pager # available in [facility]

## 2021-08-17 NOTE — Evaluation (Signed)
Occupational Therapy Evaluation Patient Details Name: Nicholas Mckenzie MRN: [id number] DOB: [birth year] Today's Date: 08/17/2021   History of Present Illness 76 y.o. male admitted 2/21 with SOB and tachycardia with possibly choking on pill. Pt with acute respiratory failure. PMhx. 06/06/2021 fall with SDH, CKD stage IIIa, nephrostomy tube, sleep apnea, HTN, OSA, depression/anxiety, CVA with residual left hemiparesis.   Clinical Impression   Pt was ambulating with a RW and reports independence in ADLs prior to admission. Presents with generalized weakness, residual L UE hemiparesis and impaired standing balance. He needs up to min assist for ADLs and OOB mobility with assistance managing lines. No family available to offer information regarding baseline cognition, he is oriented, cooperative and follows commands, may have some deficits in memory. Sp02 remained at 96% on 2L 02 during assessment. Will follow acutely, recommending HHOT upon discharge.      Recommendations for follow up therapy are one component of a multi-disciplinary discharge planning process, led by the attending physician.  Recommendations may be updated based on patient status, additional functional criteria and insurance authorization.   Follow Up Recommendations  Home health OT    Assistance Recommended at Discharge Frequent or constant Supervision/Assistance  Patient can return home with the following A little help with walking and/or transfers;A little help with bathing/dressing/bathroom;Assistance with cooking/housework;Direct supervision/assist for medications management;Direct supervision/assist for financial management;Assist for transportation;Help with stairs or ramp for entrance    Functional Status Assessment  Patient has had a recent decline in their functional status and demonstrates the ability to make significant improvements in function in a reasonable and predictable amount of time.  Equipment Recommendations   None recommended by OT    Recommendations for Other Services       Precautions / Restrictions Precautions Precautions: Fall Precaution Comments: cortrak, nephrostomy tube      Mobility Bed Mobility Overal bed mobility: Needs Assistance Bed Mobility: Supine to Sit, Sit to Supine     Supine to sit: Supervision Sit to supine: Supervision   General bed mobility comments: HOB up 30 degrees, use of rail, increased time, no physical assist    Transfers Overall transfer level: Needs assistance Equipment used: Rolling walker (2 wheels) Transfers: Sit to/from Stand Sit to Stand: Min assist           General transfer comment: cues for technique, steadying assist      Balance Overall balance assessment: Needs assistance Sitting-balance support: No upper extremity supported, Feet supported Sitting balance-Leahy Scale: Fair     Standing balance support: Bilateral upper extremity supported Standing balance-Leahy Scale: Poor Standing balance comment: RW for standing and gait                           ADL either performed or assessed with clinical judgement   ADL Overall ADL's : Needs assistance/impaired Eating/Feeding: NPO   Grooming: Oral care;Sitting;Minimal assistance   Upper Body Bathing: Minimal assistance;Sitting   Lower Body Bathing: Minimal assistance   Upper Body Dressing : Minimal assistance;Sitting   Lower Body Dressing: Minimal assistance;Sit to/from stand   Toilet Transfer: Minimal assistance;Ambulation;Rolling walker (2 wheels)   Toileting- Clothing Manipulation and Hygiene: Minimal assistance;Sit to/from stand       Functional mobility during ADLs: Minimal assistance;Rolling walker (2 wheels)       Vision Patient Visual Report: No change from baseline       Perception     Praxis      Pertinent Vitals/Pain Pain Assessment Pain  Assessment: No/denies pain     Hand Dominance Right   Extremity/Trunk Assessment Upper  Extremity Assessment Upper Extremity Assessment: Generalized weakness;LUE deficits/detail LUE Deficits / Details: baseline hemiparesis LUE Coordination: decreased fine motor;decreased gross motor   Lower Extremity Assessment Lower Extremity Assessment: Defer to PT evaluation   Cervical / Trunk Assessment Cervical / Trunk Assessment: Other exceptions Cervical / Trunk Exceptions: rounded shoulders   Communication Communication Communication: Other (comment) (low volume, hoarse)   Cognition Arousal/Alertness: Awake/alert Behavior During Therapy: WFL for tasks assessed/performed Overall Cognitive Status: No family/caregiver present to determine baseline cognitive functioning Area of Impairment: Memory                     Memory: Decreased short-term memory         General Comments: needs further assessment, pt is oriented and following commands, but with tangential comments at times and not able to recall to use flutter valve     General Comments       Exercises     Shoulder Instructions      Home Living Family/patient expects to be discharged to:: Private residence Living Arrangements: Spouse/significant other Available Help at Discharge: Family;Available 24 hours/day Type of Home: House Home Access: Ramped entrance     Home Layout: One level     Bathroom Shower/Tub: Occupational psychologist: Handicapped height     Home Equipment: Conservation officer, nature (2 wheels);BSC/3in1;Shower seat;Cane - single point          Prior Functioning/Environment Prior Level of Function : Needs assist             Mobility Comments: pt ambulates household distances with use of walker ADLs Comments: pt reports he performs ADLs without assist        OT Problem List: Decreased strength;Impaired balance (sitting and/or standing);Decreased coordination;Decreased cognition;Decreased knowledge of use of DME or AE;Impaired UE functional use      OT  Treatment/Interventions: Self-care/ADL training;DME and/or AE instruction;Therapeutic activities;Patient/family education;Balance training    OT Goals(Current goals can be found in the care plan section) Acute Rehab OT Goals OT Goal Formulation: With patient Time For Goal Achievement: 08/31/21 Potential to Achieve Goals: Good ADL Goals Pt Will Perform Grooming: with supervision;sitting Pt Will Perform Upper Body Dressing: with set-up;sitting Pt Will Perform Lower Body Dressing: with supervision;sit to/from stand Pt Will Transfer to Toilet: with supervision;ambulating Pt Will Perform Toileting - Clothing Manipulation and hygiene: with supervision;sit to/from stand  OT Frequency: Min 2X/week    Co-evaluation              AM-PAC OT "6 Clicks" Daily Activity     Outcome Measure Help from another person eating meals?: A Little Help from another person taking care of personal grooming?: A Little Help from another person toileting, which includes using toliet, bedpan, or urinal?: A Little Help from another person bathing (including washing, rinsing, drying)?: A Little Help from another person to put on and taking off regular upper body clothing?: A Little Help from another person to put on and taking off regular lower body clothing?: A Little 6 Click Score: 18   End of Session Equipment Utilized During Treatment: Gait belt;Rolling walker (2 wheels)  Activity Tolerance: Patient tolerated treatment well Patient left: in bed;with call bell/phone within reach;with nursing/sitter in room;with bed alarm set  OT Visit Diagnosis: Unsteadiness on feet (R26.81);Muscle weakness (generalized) (M62.81);Other symptoms and signs involving cognitive function;Hemiplegia and hemiparesis Hemiplegia - Right/Left: Left Hemiplegia - dominant/non-dominant: Non-Dominant Hemiplegia -  caused by: SDH                [id number] OT Time Calculation (min): 22 min Charges:  OT General Charges $OT Visit: 1  Visit OT Evaluation $OT Eval Moderate Complexity: 1 Mod  Nicholas Mckenzie, OTR/L Acute Rehabilitation Services Pager: [phone] Office: [phone]   Nicholas Mckenzie, Nicholas Mckenzie 08/17/2021, 3:40 PM

## 2021-08-18 ENCOUNTER — Inpatient Hospital Stay (HOSPITAL_COMMUNITY): Payer: Medicare Other

## 2021-08-18 DIAGNOSIS — E876 Hypokalemia: Secondary | ICD-10-CM

## 2021-08-18 LAB — CBC WITH DIFFERENTIAL/PLATELET
Abs Immature Granulocytes: 0.09 10*3/uL — ABNORMAL HIGH (ref 0.00–0.07)
Basophils Absolute: 0 10*3/uL (ref 0.0–0.1)
Basophils Relative: 0 %
Eosinophils Absolute: 0.1 10*3/uL (ref 0.0–0.5)
Eosinophils Relative: 1 %
HCT: 39.3 % (ref 39.0–52.0)
Hemoglobin: 12.6 g/dL — ABNORMAL LOW (ref 13.0–17.0)
Immature Granulocytes: 1 %
Lymphocytes Relative: 11 %
Lymphs Abs: 1 10*3/uL (ref 0.7–4.0)
MCH: 28.1 pg (ref 26.0–34.0)
MCHC: 32.1 g/dL (ref 30.0–36.0)
MCV: 87.7 fL (ref 80.0–100.0)
Monocytes Absolute: 0.6 10*3/uL (ref 0.1–1.0)
Monocytes Relative: 7 %
Neutro Abs: 7.1 10*3/uL (ref 1.7–7.7)
Neutrophils Relative %: 80 %
Platelets: 181 10*3/uL (ref 150–400)
RBC: 4.48 MIL/uL (ref 4.22–5.81)
RDW: 14.7 % (ref 11.5–15.5)
WBC: 8.9 10*3/uL (ref 4.0–10.5)
nRBC: 0 % (ref 0.0–0.2)

## 2021-08-18 LAB — COMPREHENSIVE METABOLIC PANEL
ALT: 14 U/L (ref 0–44)
AST: 14 U/L — ABNORMAL LOW (ref 15–41)
Albumin: 2.8 g/dL — ABNORMAL LOW (ref 3.5–5.0)
Alkaline Phosphatase: 49 U/L (ref 38–126)
Anion gap: 9 (ref 5–15)
BUN: 12 mg/dL (ref 8–23)
CO2: 29 mmol/L (ref 22–32)
Calcium: 9.1 mg/dL (ref 8.9–10.3)
Chloride: 105 mmol/L (ref 98–111)
Creatinine, Ser: 1.27 mg/dL — ABNORMAL HIGH (ref 0.61–1.24)
GFR, Estimated: 59 mL/min — ABNORMAL LOW (ref >60)
Glucose, Bld: 101 mg/dL — ABNORMAL HIGH (ref 70–99)
Potassium: 3 mmol/L — ABNORMAL LOW (ref 3.5–5.1)
Sodium: 143 mmol/L (ref 135–145)
Total Bilirubin: 0.5 mg/dL (ref 0.3–1.2)
Total Protein: 6.1 g/dL — ABNORMAL LOW (ref 6.5–8.1)

## 2021-08-18 LAB — URINALYSIS, ROUTINE W REFLEX MICROSCOPIC
Bilirubin Urine: NEGATIVE
Glucose, UA: NEGATIVE mg/dL
Hgb urine dipstick: NEGATIVE
Ketones, ur: 5 mg/dL — AB
Leukocytes,Ua: NEGATIVE
Nitrite: NEGATIVE
Protein, ur: 30 mg/dL — AB
Specific Gravity, Urine: 1.013 (ref 1.005–1.030)
pH: 7 (ref 5.0–8.0)

## 2021-08-18 LAB — GLUCOSE, CAPILLARY
Glucose-Capillary: 103 mg/dL — ABNORMAL HIGH (ref 70–99)
Glucose-Capillary: 106 mg/dL — ABNORMAL HIGH (ref 70–99)
Glucose-Capillary: 109 mg/dL — ABNORMAL HIGH (ref 70–99)
Glucose-Capillary: 128 mg/dL — ABNORMAL HIGH (ref 70–99)
Glucose-Capillary: 86 mg/dL (ref 70–99)
Glucose-Capillary: 94 mg/dL (ref 70–99)

## 2021-08-18 LAB — PHOSPHORUS
Phosphorus: 3.5 mg/dL (ref 2.5–4.6)
Phosphorus: 2.8 mg/dL (ref 2.5–4.6)

## 2021-08-18 LAB — MAGNESIUM
Magnesium: 2.1 mg/dL (ref 1.7–2.4)
Magnesium: 2.2 mg/dL (ref 1.7–2.4)

## 2021-08-18 IMAGING — DX DG CHEST 1V PORT
1 series · 1 of 1 positions shown · non-contrast
Comparison: Portable exam [VC] hours compared to [DATE]

CLINICAL DATA: Shortness of breath, aspiration

EXAM:
PORTABLE CHEST 1 VIEW

[chest]
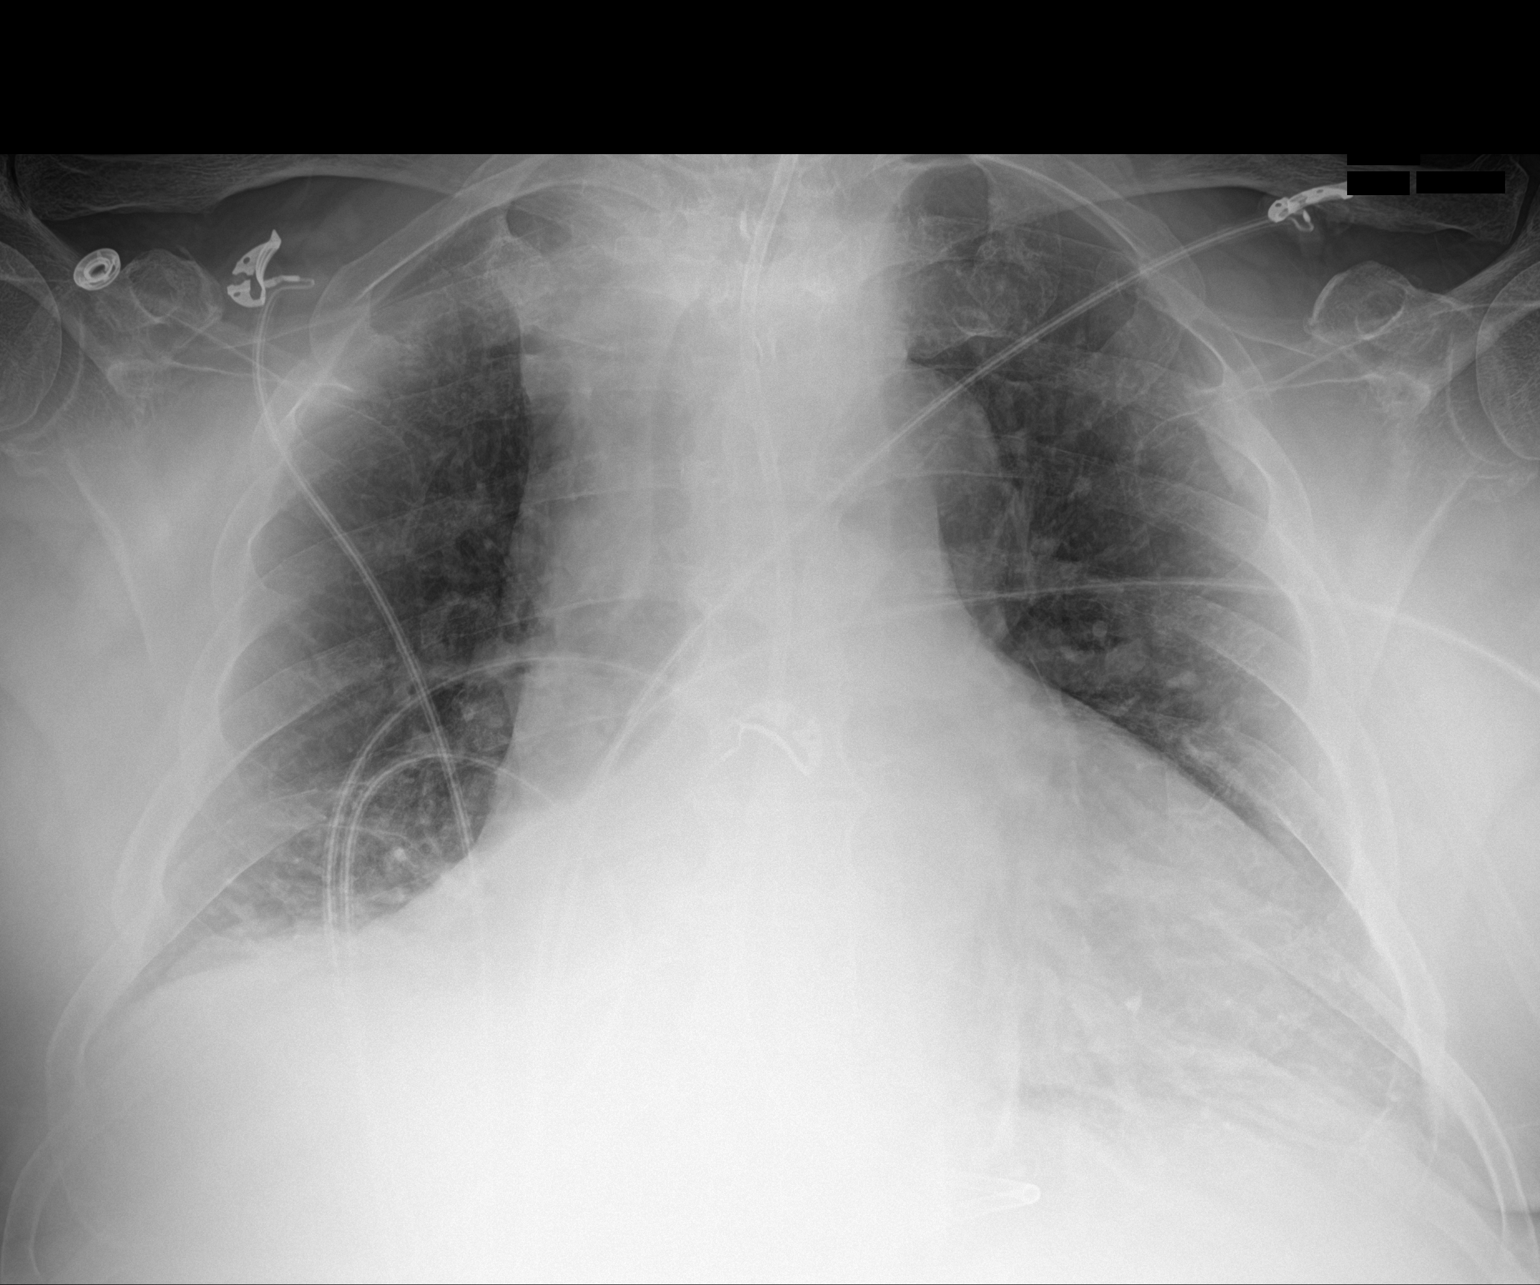

[1 of 1 positions shown; findings below may reference images not displayed]

FINDINGS: Feeding tube extends into stomach.

Enlargement of cardiac silhouette.

Stable mediastinal contours and pulmonary vascularity.

LEFT basilar subsegmental atelectasis with persistent atelectasis
and consolidation at RIGHT lung base.

No definite pleural effusion or pneumothorax.
IMPRESSION: Enlargement of cardiac silhouette.

Persistent atelectasis and infiltrate at RIGHT base.

LEFT basilar subsegmental atelectasis.

## 2021-08-18 MED ORDER — LOPERAMIDE HCL 2 MG PO CAPS
2.0000 mg | ORAL_CAPSULE | ORAL | Status: DC | PRN
  Administered 2021-08-18 – 2021-08-20 (×3): 2 mg via ORAL
  Filled 2021-08-18 (×4): qty 1

## 2021-08-18 MED ORDER — POTASSIUM CHLORIDE 20 MEQ PO PACK
40.0000 meq | PACK | Freq: Two times a day (BID) | ORAL | Status: AC
  Administered 2021-08-18 (×2): 40 meq
  Filled 2021-08-18 (×2): qty 2

## 2021-08-18 NOTE — Progress Notes (Addendum)
Speech Language Pathology Treatment: Dysphagia  Patient Details Name: Nicholas Mckenzie MRN: [id number] DOB: April 21, 1946 Today's Date: 08/18/2021 Time: 7948-0165 SLP Time Calculation (min) (ACUTE ONLY): 20 min  Assessment / Plan / Recommendation Clinical Impression  Pt seen at bedside for skilled ST intervention targeting education, oral care, and therapeutic dysphagia exercises. Pt completed oral care after set up, using suction to clear oral cavity during and after care. Pt accepted ice chips x3, practicing an effortful swallow. Pt then declined additional ice chips, indicating he felt they were "going down the wrong way". Pt was encouraged to utilize effortful swallow whenever he swallows throughout the day - saliva or water. Pt was also instructed on correct completion of CTAR exercise. He was encouraged to complete this exercises throughout the day. Pt would benefit from continued ST intervention with home health at discharge, to maximize return of safe swallow function and ability to take PO. SLP will continue to follow acutely.   HPI HPI: Pt is a 76 yo male adm to [facility] after concern for aspiration event with pill.  Pt CT chest showed "Interval finding of mildly nodular and ground-glass airspace  disease in the right lung base with debris and or fluid within the  bronchi, given clear lung bases on the CT performed earlier today,  suspect that findings could be secondary to aspiration."  Swallow eval ordered. 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  Pt reports he tried to swallow pill dry and this resulted in aspiration.  Per CT neck, pt has Submucosal edema in the supraglottic larynx.      SLP Plan  Continue with current plan of care      Recommendations for  follow up therapy are one component of a multi-disciplinary discharge planning process, led by the attending physician.  Recommendations may be updated based on patient status, additional functional criteria and insurance authorization.    Recommendations  Diet recommendations: NPO (ice chips after oral care) Liquids provided via: Teaspoon Medication Administration: Via alternative means Postural Changes and/or Swallow Maneuvers: Seated upright 90 degrees                Oral Care Recommendations: Oral care QID Follow Up Recommendations: Home health SLP Assistance recommended at discharge: Frequent or constant Supervision/Assistance SLP Visit Diagnosis: Dysphagia, pharyngeal phase (R13.13);Dysphagia, pharyngoesophageal phase (R13.14) Plan: Continue with current plan of care          [name] B. Murvin Natal, [name], CCC-SLP Speech Language Pathologist Office: [phone]  [name]  08/18/2021, 2:26 PM

## 2021-08-18 NOTE — Progress Notes (Signed)
Patient ID: Nicholas Mckenzie, male   DOB: [birth year], 76 y.o.   MRN: [id number]  Patient feels better. His voice is still a whisper like and airy, No breathing issues in upper airway.  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 A vocal cord augmentation may be helpful after he recovers more. Outpatient follow up to set that up and evaluate the larynx will be next step. Follow up number is [phone]

## 2021-08-18 NOTE — Care Management Important Message (Signed)
Important Message  Patient Details  Name: Nicholas Mckenzie MRN: [id number] Date of Birth: 1945-11-01   Medicare Important Message Given:  Yes     Dorena Bodo 08/18/2021, 3:32 PM

## 2021-08-18 NOTE — Progress Notes (Addendum)
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  He was n.p.o. prior to the scan but decided to take his Prozac pill before going to scan did not use water and he felt like he choked on the pill.  He started having some mild trouble breathing and elevated blood pressure but decided to proceed with a CT scan anyway and then went home and then took his Norvasc and drink some juice.  He started having more trouble throughout the day with his breathing and I brought him to the ED and he was noted to be hypertensive, tachycardic and hypoxic.  Subsequently the patient had likely aspirated his pills and had to be placed on supplemental oxygen via nasal cannula and improved.  He had to be placed on 13 L of supplemental oxygen and when he came to the ED, he underwent a CT scan of the chest which showed aspiration findings.  He was admitted to the stepdown progressive unit and admitted for his hypoxia after aspiration.  Patient became extremely short of breath and began having a persistent cough and patient's wife has noticed that his voice is become more of a whisper.  Patient also complained of sore throat.  In the ED he was hypoxic requiring at least 13 L of supplemental oxygen which was then weaned to 8 L.  CTA of the chest was negative for PE but did show findings concerning for  aspiration.  His labs did show leukocytosis and patient was febrile with a temperature of 102 and a COVID test was negative.  He was initially on empiric antibiotics and admitted for further work-up and admitted for sepsis secondary to aspiration pneumonia.  **Interim History ENT was consulted for the edema of the supraglottis and they recommended initially evaluating with a scope but patient did not want to do this so they are holding off for now.  Given slightly worsening pulmonary status overnight ENT and no new recommendations recommended continue n.p.o. for now.  Overnight the patient appeared more short of breath and had a change in consistency and inquiry of his sputum.  He is no longer bringing up large amounts of thick copious green sputum and his chest x-ray revealed substantial increase in his right basilar opacity compared to his prior chest x-ray.  His antibiotics were changed to Unasyn and his tube feedings were discontinued given that there is concern for ongoing aspiration due to malfunctioning tube feeding leading to worsening aspiration.  His tube feeds were resumed today after his Cortrak was unclogged.  He was now off of supplemental oxygen yesterday morning when I saw him and his respiratory status is doing a little bit better.  Today he was more somnolent as he did not have a good night's rest. 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 SLP continues to recommend NPO.   Assessment and Plan: * Acute respiratory failure with hypoxia ([place])- (present on admission) -In the setting of aspiration event and aspiration pneumonia -Patient aspirated on his Protonix pill and became extremely short of breath going on for a CT scan for his last urology evaluation. -Initiated on empiric antibiotics with IV ceftriaxone and azithromycin which we will continue but this was  changed to IV Unasyn given his worsening respiratory status -Continue treatment as below delineated in sepsis and aspiration pneumonia -SLP evaluated and recommending n.p.o. except ice chips -Given that he is n.p.o. we will place a small bore Cortrak for medication administration and tube feedings -Continue with speech therapy -SpO2: 95 % O2 Flow Rate (L/min): 2 L/min -Continuous pulse oximetry and maintain O2 saturations greater than 90% -Continue supplemental oxygen via nasal cannula wean O2 as tolerated -Patient's voice has become more whisper so a CT scan of the neck soft tissue was done and showed "Submucosal edema in the supraglottic larynx." -Repeat chest x-ray as below -Will discuss with ENT about CT Soft Tissue Neck Findings and Dr. Janace Hoard will come assess the patient and he had recommended scope and the patient the patient refused at this time and will continue monitor patient's respiratory status carefully and then consider scoping in a few days but now recommending outpatient follow up.     Malnutrition of moderate degree Nutrition Status: Nutrition Problem: Moderate Malnutrition (in the context of social/environmental circumstances) Etiology: dysphagia Signs/Symptoms: mild muscle depletion, mild fat depletion, percent weight loss (10.3% x 6 months) Percent weight loss: 10.3 % Interventions: Refer to RD note for recommendations     OSA (obstructive sleep apnea) -C/w CPAP qHS    Obesity (BMI 0000000) -Complicates overall prognosis and care -Estimated body mass index is 31.38 kg/m as calculated from the following:   Height as of this encounter: 5\' 8"  (1.727 m).   Weight as of this encounter: 93.6 kg.  -Weight Loss and Dietary Counseling given  Stage 3a chronic kidney disease (CKD) (HCC) -Patient's BUN/creatinine is currently at baseline slightly worsened -BUN/creatinine went from 23/1.40 -> 17/1.58 ->  12/1.36 -> 12/1.27 -Avoid further nephrotoxic medications,  consciousness, hypotension renally dose medications -Repeat CMP in the a.m.  Nephrolithiasis -Plan was to have a nephrectomy later this week -Patient sees Dr. Tresa Moore of Urology as an outpatient -CT Renal Stone study done as an outpatient which we need to get Results  -Will notify Dr. Tresa Moore that patient is currently hospitalized with Aspiration as a courtesy; Dr. Tresa Moore states that he will stop by to see the patient later today  Hx of subdural hematoma -Was presently obtaining CT in anticipation for a right-sided nephrectomy  later this week however he aspirated un the CT Scanner -Given concern for worsening weakness compared to his baseline we have ordered a MRI which showed no acute CVA -PT/OT to evaluate and Treat and recommending Home Health PT  Sore throat -CT Neck as Above  -CT of the neck done and showed "Submucosal edema in the supraglottic larynx." -ENT to Evaluate and appreciate Dr. Janace Hoard evaluating.  Dr. Janace Hoard had recommended a scope but patient has initially refused.  They recommend continuing to follow to see if the patient proves for scoping.  His hoarseness is starting to improve a little bit and he continues to have no stridor or airway noise. -ENT recommends continuing n.p.o. for now and the patient is doing a little bit better and feel that his voice is  respiratory with no breathing issues bilateral.  They feel that his vocal cord weakness that may be why he is aspirating more and they feel that a vocal cord augmentation may be helpful after he recovers more.  They are recommending outpatient evaluation  Severe sepsis ([place]) -Patient aspirated yesterday and presented with fever, tachycardia, tachypnea and a source of infection with aspiration findings -Can continue empiric antibiotics with IV ceftriaxone and azithromycin and will obtain sputum cultures; overnight his respiratory status worsened so his antibiotics were changed to Unasyn -SLP done and recommending n.p.o. for  now -Blood cultures not obtained on admission for unclear reasons so we will obtain now -Respiratory panel done by PCR showed negative influenza A and negative influenza B as well as SARS-CoV-2 testing negative by PCR -CTA of the chest PE protocol done and showed "Negative for acute pulmonary embolus. Interval finding of mildly nodular and ground-glass airspace disease in the right lung base with debris and or fluid within the bronchi, given clear lung bases on the CT performed earlier today, suspect that findings could be secondary to aspiration." -Repeat chest x-ray done and showed "Likely trace right pleural effusion.Interval increase in right base patchy airspace opacity. Finding could represent a combination of infection/inflammation versus atelectasis". -Will add Xopenex and Atrovent scheduled as well as flutter valve, incentive spirometer and guaifenesin via the Cortrak -Budesonide 0.25 mg nebs twice daily and continue monitor his respiratory status carefully -IVF now stopped  -Given his generalized weakness and worsening swallow function MRI was done to evaluate for CVA and this was negative -We will do sepsis complete work-up and obtain urinalysis however this may be invalid given that he is on antibiotics already -Continue to follow cultures; blood cultures x2 showed no growth to date at 2 Days -WBC went from 16.1 -> 14.2 -> 11.2 -> 8.9 -NG Cortrak is being placed in the inability to swallow; will continue n.p.o. for now -Follow chest x-rays and if not improving may consult pulmonary for further evaluation recommendations    Aspiration pneumonia ([place])- (present on admission) -Patient aspirated yesterday and presented with fever, tachycardia, tachypnea and a source of infection with aspiration findings -Can continue empiric antibiotics with IV ceftriaxone and azithromycin and will obtain sputum cultures and this was changed to Unasyn -SLP done and recommending n.p.o. for now -Blood  cultures not obtained on admission for unclear reasons so we will obtain now -Respiratory panel done by PCR showed negative influenza A and negative influenza B as well as SARS-CoV-2 testing negative by PCR -CTA of the chest PE protocol done and showed "Negative for acute pulmonary embolus. Interval finding of mildly nodular and ground-glass airspace disease in the right lung base with debris and or fluid within the bronchi, given clear lung bases on the CT performed earlier today, suspect that findings could be secondary to aspiration." -Repeat CXR done and showed "Likely trace right pleural effusion. Interval increase in right base patchy airspace opacity. Finding could represent a combination of infection/inflammation versus atelectasis." -Given his generalized weakness and worsening swallow function MRI was done to evaluate for CVA and this was negative -Started patient on Xopenex and Atrovent as well as Pulmicort and will continue.  Also placed on Guaifenesin via tube as well as flutter valve incentive spirometry -We will do sepsis complete work-up and obtain urinalysis however this may be invalid given that he is on antibiotics already -Continue to follow cultures;  -WBC went from 16.1 -> 14.2 -> 11.2 -> 8.9 -NG Cortrak is being placed  in the inability to swallow -If not improving will consult Pulmonary and repeat chest x-ray in a.m. -Repeat CXR and showed "Enlargement of cardiac silhouette. Persistent atelectasis and infiltrate at RIGHT base.LEFT basilar subsegmental atelectasis." -Continue to Monitor Respiratory Status carefully  H/O ischemic right PCA stroke -See Hx of Subdural Hematoma  Hypokalemia- (present on admission) -Patient's K+ went from 3.3 -> 2.9 -> 3.4 -> 3.0 -Mag Level was 2.1 -Replete with po Kcl 40 mEQ BID x2 -Continue to Monitor and Replete as Necessary -Repeat CMP in the AM   Hypertension- (present on admission) -Continue n.p.o. and will initiate amlodipine through  small bore feeding tube -Continue to monitor blood pressures per protocol -Last BP was 151/95  CKD (chronic kidney disease) stage 3a- (present on admission) -Patient's BUNs/creatinine is relatively stable last few days and is now 12/1.36 -> 12/1.27 -IVF now stopped  -Avoid further nephrotoxic medications, contrast dyes, hypotension renally dose medications -Repeat CMP in a.m.   Nutrition Documentation    [facility]-Admission (Current) from 08/15/2021 in [facility]  Nutrition Problem Moderate Malnutrition  [in the context of social/environmental circumstances]  Etiology dysphagia  Nutrition Goal Patient will meet greater than or equal to 90% of their needs  Interventions Refer to RD note for recommendations      Subjective: Seen and examined at bedside and was a little somnolent. States breathing is ok. No nausea or vomiting. No lightheadedness or dizziness. No other concerns or complaints at this time.   Physical Exam: Vitals:   08/18/21 0840 08/18/21 0843 08/18/21 1113 08/18/21 1354  BP:   (!) 151/95   Pulse:   90   Resp:   16   Temp:   98.3 F (36.8 C)   TempSrc:   Oral   SpO2: 96% 98% 100% 99%  Weight:      Height:       Examination: Physical Exam:  Constitutional: WN/WD obese Caucasian male in NAD and appears calm and comfortable Respiratory: Diminished to auscultation bilaterally with coarse breath sounds, no wheezing, rales, rhonchi. Normal respiratory effort and patient is not tachypenic. No accessory muscle use. Wearing Supplemental O2  Cardiovascular: RRR, no murmurs / rubs / gallops. S1 and S2 auscultated. Trace LE Edema Abdomen: Soft, non-tender, Distended 2/2 body habitus. Bowel sounds positive.  GU: Deferred. Musculoskeletal: No clubbing / cyanosis of digits/nails. No joint deformity upper and lower extremities.   Data Reviewed:  I have independently interpreted and reviewed the patient's clinical laboratory  data  Patient's K+ is now 3.0, BUN/Cr is now 12/1.27, and Hgb/Hct is now 12.6/39.3  Family Communication: No family present at bedside   Disposition: Status is: Inpatient Remains inpatient appropriate because: Remains NPO and continuing to Treat Pneumonia   Planned Discharge Destination: Home with Home Health  DVT Prophylaxis: Enoxaparin 40 mg q24h  Author: Raiford Noble, DO [facility] 08/18/2021 5:04 PM  For on call review [email or web address].

## 2021-08-18 NOTE — Consult Note (Signed)
Reason for Consult: Rt Atrophic Kidney with Retained Stent / Large Bladder Stone  Referring Physician: Raiford Noble DO  Nicholas Mckenzie is an 76 y.o. male.   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 Placed years ago per wife for stones at another facility in [place]. Distal aspect of stent with 3cm+ bladder stone. Appears 1 artery / 2 vein (lower accessory inferior to artery) right renovascular anatomy. Colon antero-renal. Neph tube placed 06/07/21. Cr 1.4's.   2 - Bacteruria - enterococcus by UCX 04/02/21 . Final CX sens cipro, gent. FU CX negative.   3 - Aspiration Pneumonia - aspiration pneumonia 07/2021 encited by failure to swallow pill. Failed subsequent swallow study. Has h/o CVA / chronic subdural. Had a family member with G tube previously and is familiar with management.   PMH sig for recurrent CVA, subdural hematoma (now in home PT program), TNA. NO prior chest/abd surgeries. No ischemic CV disese. His PCP is Early Osmond MD.   Today "Nicholas Mckenzie" is seen in consultation for above. He was to have office visit on 2/28 but is presently hospitalized. He was misundertanding had surgery scheduled for 2/28. He is presently admitted with aspiration pneumonia, failed swallow study. He is in remarkably good spirits.   Past Medical History:  Diagnosis Date   CKD (chronic kidney disease) stage 3, GFR 30-59 ml/min (HCC)    Hypertension    Kidney stones    Sleep apnea     Past Surgical History:  Procedure Laterality Date   CATARACT EXTRACTION     IR NEPHROSTOMY EXCHANGE RIGHT  06/07/2021   IR NEPHROSTOMY PLACEMENT RIGHT  04/07/2021   IR PATIENT EVAL TECH 0-60 MINS  06/02/2021   kidney stent     MANDIBLE FRACTURE SURGERY      Family History  Problem Relation Age of Onset   Hypertension Mother    Hypertension Other     Social History:  reports that he has never smoked. He has  never used smokeless tobacco. He reports current alcohol use. No history on file for drug use.  Allergies:  Allergies  Allergen Reactions   Statins Other (See Comments)    Pt reports severe weakness and mobility issues with his med, as well as diarrhea    Medications: I have reviewed the patient's current medications.  Results for orders placed or performed during the hospital encounter of 08/15/21 (from the past 48 hour(s))  Glucose, capillary     Status: Abnormal   Collection Time: 08/17/21  3:56 AM  Result Value Ref Range   Glucose-Capillary 115 (H) 70 - 99 mg/dL    Comment: Glucose reference range applies only to samples taken after fasting for at least 8 hours.  Magnesium     Status: None   Collection Time: 08/17/21  4:57 AM  Result Value Ref Range   Magnesium 2.2 1.7 - 2.4 mg/dL    Comment: Performed at [facility] [address] [place], [place] [address]  Phosphorus     Status: None   Collection Time: 08/17/21  4:57 AM  Result Value Ref Range   Phosphorus 2.5 2.5 - 4.6 mg/dL    Comment: Performed at [facility] [address], [place], [place] [id number]  Basic metabolic panel     Status: Abnormal   Collection Time: 08/17/21  4:57 AM  Result Value Ref Range   Sodium  143 135 - 145 mmol/L   Potassium 3.2 (L) 3.5 - 5.1 mmol/L   Chloride 105 98 - 111 mmol/L   CO2 28 22 - 32 mmol/L   Glucose, Bld 101 (H) 70 - 99 mg/dL    Comment: Glucose reference range applies only to samples taken after fasting for at least 8 hours.   BUN 12 8 - 23 mg/dL   Creatinine, Ser 1.36 (H) 0.61 - 1.24 mg/dL   Calcium 9.1 8.9 - 10.3 mg/dL   GFR, Estimated 55 (L) >60 mL/min    Comment: (NOTE) Calculated using the CKD-EPI Creatinine Equation (2021)    Anion gap 10 5 - 15    Comment: Performed at [facility] [address], [place], [place] [address]  CBC with Differential/Platelet     Status: Abnormal   Collection Time: 08/17/21  4:57 AM  Result Value Ref Range   WBC  11.2 (H) 4.0 - 10.5 K/uL   RBC 4.67 4.22 - 5.81 MIL/uL   Hemoglobin 13.2 13.0 - 17.0 g/dL   HCT 40.8 39.0 - 52.0 %   MCV 87.4 80.0 - 100.0 fL   MCH 28.3 26.0 - 34.0 pg   MCHC 32.4 30.0 - 36.0 g/dL   RDW 14.7 11.5 - 15.5 %   Platelets 189 150 - 400 K/uL   nRBC 0.0 0.0 - 0.2 %   Neutrophils Relative % 81 %   Neutro Abs 8.9 (H) 1.7 - 7.7 K/uL   Lymphocytes Relative 13 %   Lymphs Abs 1.5 0.7 - 4.0 K/uL   Monocytes Relative 6 %   Monocytes Absolute 0.6 0.1 - 1.0 K/uL   Eosinophils Relative 0 %   Eosinophils Absolute 0.1 0.0 - 0.5 K/uL   Basophils Relative 0 %   Basophils Absolute 0.0 0.0 - 0.1 K/uL   Immature Granulocytes 0 %   Abs Immature Granulocytes 0.05 0.00 - 0.07 K/uL    Comment: Performed at [facility], [address] [place], [place] [address]  Magnesium     Status: None   Collection Time: 08/17/21  4:17 PM  Result Value Ref Range   Magnesium 2.1 1.7 - 2.4 mg/dL    Comment: Performed at [facility] [address] [place], [place] [address]  Phosphorus     Status: None   Collection Time: 08/17/21  4:17 PM  Result Value Ref Range   Phosphorus 2.8 2.5 - 4.6 mg/dL    Comment: Performed at [facility] [address], [place], [place] [address]  Glucose, capillary     Status: Abnormal   Collection Time: 08/17/21  9:06 PM  Result Value Ref Range   Glucose-Capillary 143 (H) 70 - 99 mg/dL    Comment: Glucose reference range applies only to samples taken after fasting for at least 8 hours.  Glucose, capillary     Status: Abnormal   Collection Time: 08/18/21 12:41 AM  Result Value Ref Range   Glucose-Capillary 128 (H) 70 - 99 mg/dL    Comment: Glucose reference range applies only to samples taken after fasting for at least 8 hours.  Glucose, capillary     Status: Abnormal   Collection Time: 08/18/21  5:13 AM  Result Value Ref Range   Glucose-Capillary 103 (H) 70 - 99 mg/dL    Comment: Glucose reference range applies only to samples taken after fasting  for at least 8 hours.  Magnesium     Status: None   Collection Time:  08/18/21  5:50 AM  Result Value Ref Range   Magnesium 2.1 1.7 - 2.4 mg/dL    Comment: Performed at [facility] [address], [place], [place] [address]  Phosphorus     Status: None   Collection Time: 08/18/21  5:50 AM  Result Value Ref Range   Phosphorus 3.5 2.5 - 4.6 mg/dL    Comment: Performed at [facility] [address], [place], [place] [address]  CBC with Differential/Platelet     Status: Abnormal   Collection Time: 08/18/21  5:50 AM  Result Value Ref Range   WBC 8.9 4.0 - 10.5 K/uL   RBC 4.48 4.22 - 5.81 MIL/uL   Hemoglobin 12.6 (L) 13.0 - 17.0 g/dL   HCT 39.3 39.0 - 52.0 %   MCV 87.7 80.0 - 100.0 fL   MCH 28.1 26.0 - 34.0 pg   MCHC 32.1 30.0 - 36.0 g/dL   RDW 14.7 11.5 - 15.5 %   Platelets 181 150 - 400 K/uL   nRBC 0.0 0.0 - 0.2 %   Neutrophils Relative % 80 %   Neutro Abs 7.1 1.7 - 7.7 K/uL   Lymphocytes Relative 11 %   Lymphs Abs 1.0 0.7 - 4.0 K/uL   Monocytes Relative 7 %   Monocytes Absolute 0.6 0.1 - 1.0 K/uL   Eosinophils Relative 1 %   Eosinophils Absolute 0.1 0.0 - 0.5 K/uL   Basophils Relative 0 %   Basophils Absolute 0.0 0.0 - 0.1 K/uL   Immature Granulocytes 1 %   Abs Immature Granulocytes 0.09 (H) 0.00 - 0.07 K/uL    Comment: Performed at [facility] [address] [place], [place] [address]  Comprehensive metabolic panel     Status: Abnormal   Collection Time: 08/18/21  5:50 AM  Result Value Ref Range   Sodium 143 135 - 145 mmol/L   Potassium 3.0 (L) 3.5 - 5.1 mmol/L   Chloride 105 98 - 111 mmol/L   CO2 29 22 - 32 mmol/L   Glucose, Bld 101 (H) 70 - 99 mg/dL    Comment: Glucose reference range applies only to samples taken after fasting for at least 8 hours.   BUN 12 8 - 23 mg/dL   Creatinine, Ser 1.27 (H) 0.61 - 1.24 mg/dL   Calcium 9.1 8.9 - 10.3 mg/dL   Total Protein 6.1 (L) 6.5 - 8.1 g/dL   Albumin 2.8 (L) 3.5 - 5.0 g/dL   AST 14 (L) 15 - 41 U/L    ALT 14 0 - 44 U/L   Alkaline Phosphatase 49 38 - 126 U/L   Total Bilirubin 0.5 0.3 - 1.2 mg/dL   GFR, Estimated 59 (L) >60 mL/min    Comment: (NOTE) Calculated using the CKD-EPI Creatinine Equation (2021)    Anion gap 9 5 - 15    Comment: Performed at [facility], [place] [address] [place], [place] [address]  Urinalysis, Routine w reflex microscopic     Status: Abnormal   Collection Time: 08/18/21  7:11 AM  Result Value Ref Range   Color, Urine YELLOW YELLOW   APPearance CLEAR CLEAR   Specific Gravity, Urine 1.013 1.005 - 1.030   pH 7.0 5.0 - 8.0   Glucose, UA NEGATIVE NEGATIVE mg/dL   Hgb urine dipstick NEGATIVE NEGATIVE   Bilirubin Urine NEGATIVE NEGATIVE   Ketones, ur 5 (A) NEGATIVE mg/dL   Protein, ur 30 (A) NEGATIVE mg/dL   Nitrite NEGATIVE NEGATIVE  Leukocytes,Ua NEGATIVE NEGATIVE   RBC / HPF 0-5 0 - 5 RBC/hpf   WBC, UA 11-20 0 - 5 WBC/hpf   Bacteria, UA RARE (A) NONE SEEN   Squamous Epithelial / LPF 0-5 0 - 5   Mucus PRESENT     Comment: Performed at [facility], [address] [place], [place] [address]  Glucose, capillary     Status: Abnormal   Collection Time: 08/18/21  8:08 AM  Result Value Ref Range   Glucose-Capillary 109 (H) 70 - 99 mg/dL    Comment: Glucose reference range applies only to samples taken after fasting for at least 8 hours.  Glucose, capillary     Status: Abnormal   Collection Time: 08/18/21 11:18 AM  Result Value Ref Range   Glucose-Capillary 106 (H) 70 - 99 mg/dL    Comment: Glucose reference range applies only to samples taken after fasting for at least 8 hours.  Glucose, capillary     Status: None   Collection Time: 08/18/21  3:54 PM  Result Value Ref Range   Glucose-Capillary 94 70 - 99 mg/dL    Comment: Glucose reference range applies only to samples taken after fasting for at least 8 hours.  Magnesium     Status: None   Collection Time: 08/18/21  4:31 PM  Result Value Ref Range   Magnesium 2.2 1.7 - 2.4 mg/dL     Comment: Performed at [facility], [address] [place], [place] [address]  Phosphorus     Status: None   Collection Time: 08/18/21  4:31 PM  Result Value Ref Range   Phosphorus 2.8 2.5 - 4.6 mg/dL    Comment: Performed at [facility], [address], [place], [place] [address]    DG Chest 1 View  Result Date: 08/16/2021 CLINICAL DATA:  Shortness of breath, acute respiratory failure with hypoxia EXAM: CHEST  1 VIEW COMPARISON:  Chest x-ray 08/15/2021, CT angiography chest 08/15/2021 FINDINGS: Enteric tube coursing below the hemidiaphragm. The heart and mediastinal contours are unchanged. Aortic calcification. Interval increase in right base patchy airspace opacity. No pulmonary edema. Likely trace right pleural effusion. No pneumothorax. No acute osseous abnormality. IMPRESSION: 1. Likely trace right pleural effusion. 2. Interval increase in right base patchy airspace opacity. Finding could represent a combination of infection/inflammation versus atelectasis. Electronically Signed   By: Tish Frederickson M.D.   On: 08/16/2021 23:28   DG CHEST PORT 1 VIEW  Result Date: 08/18/2021 CLINICAL DATA:  Shortness of breath, aspiration EXAM: PORTABLE CHEST 1 VIEW COMPARISON:  Portable exam 0644 hours compared to 08/16/2021 FINDINGS: Feeding tube extends into stomach. Enlargement of cardiac silhouette. Stable mediastinal contours and pulmonary vascularity. LEFT basilar subsegmental atelectasis with persistent atelectasis and consolidation at RIGHT lung base. No definite pleural effusion or pneumothorax. IMPRESSION: Enlargement of cardiac silhouette. Persistent atelectasis and infiltrate at RIGHT base. LEFT basilar subsegmental atelectasis. Electronically Signed   By: Ulyses Southward M.D.   On: 08/18/2021 08:21   DG Abd Portable 1V  Result Date: 08/17/2021 CLINICAL DATA:  NG tube placement EXAM: PORTABLE ABDOMEN - 1 VIEW COMPARISON:  08/16/2021 FINDINGS: Tip of enteric tube is seen in the region of  distal antrum/pylorus of the stomach. There is percutaneous right nephrostomy catheter. Right ureteral stent is seen. There is contrast in the lumen of colon. Diverticula are seen in the colon. There is no significant small bowel dilation. IMPRESSION: Tip of enteric tube is seen in the region distal antrum/pylorus of  the stomach. Electronically Signed   By: Elmer Picker M.D.   On: 08/17/2021 13:09    Review of Systems  Constitutional:  Negative for chills and unexpected weight change.  Respiratory:  Positive for shortness of breath and wheezing.   All other systems reviewed and are negative. Blood pressure (!) 151/95, pulse 90, temperature 98.3 F (36.8 C), temperature source Oral, resp. rate 16, height 5\' 8"  (1.727 m), weight 96.1 kg, SpO2 99 %. Physical Exam Vitals reviewed.  Constitutional:      Comments: IN good spirits. Stigmata of prior CVA. AT basleine.   HENT:     Head:     Comments: DHT in Rt nares  Eyes:     Pupils: Pupils are equal, round, and reactive to light.  Cardiovascular:     Rate and Rhythm: Normal rate.  Pulmonary:     Effort: Pulmonary effort is normal.  Abdominal:     General: Abdomen is flat.  Genitourinary:    Comments: Rt neph tube in place with non-foul urine.  Musculoskeletal:     Cervical back: Normal range of motion.  Skin:    General: Skin is warm.  Neurological:     Mental Status: He is alert.    Assessment/Plan:  Discussed frankly that pt remains marginal operative candidate unless able to maintain adequate nutrition. He is in full agreement. He is amenable to strict diets or even G-tube if needed.   Fortunately his present renal physiology with Rt neph tube is safe. Rec continue for now. We can easily reshceduld his office follow up for later than 2/28 pending his hospital stay. Please call with questions anytime.   Alexis Frock 08/18/2021, 6:00 PM

## 2021-08-18 NOTE — TOC Initial Note (Signed)
Transition of Care [facility]) - Initial/Assessment Note    Patient Details  Name: Nicholas Mckenzie MRN: [id number] Date of Birth: 1945-09-01  [facility]) CM/SW Contact:    Bethena Roys, RN Phone Number: 08/18/2021, 2:39 PM  Clinical Narrative:  Risk for readmission assessment completed. PTA patient was from home with spouse. Patient provided Case Manager verbal permission to call spouse regarding disposition needs. Case Manager called spouse and she discussed that the patient has a walk in shower with the seat that is fully equipped for him. Patient has used [facility] in the past and wants to use them again. Patient will benefit from [facility] PT/OT/Aide/SLP-patient and spouse agreeable. Referral submitted to [facility] and they will be able to service the patient once he transitions home. Spouse has some questions regarding personal care services-brochures were left in the room. Case Manager will continue to follow for additional transition of care needs.                 Expected Discharge Plan: [place] Barriers to Discharge: Continued Medical Work up   Patient Goals and CMS Choice Patient states their goals for this hospitalization and ongoing recovery are:: to return home once stable.   Choice offered to / list presented to : Spouse (Spouse has used [facility] in the past and wants to use again.)  Expected Discharge Plan and Services Expected Discharge Plan: [place] In-house Referral: NA Discharge Planning Services: CM Consult Post Acute Care Choice: [place] arrangements for the past 2 months: Single Family Home                 DME Arranged: N/A DME Agency: NA       HH Arranged: PT, OT, Nurse's Aide, Speech Therapy HH Agency: [place] ([facility]) Date HH Agency Contacted: 08/18/21 Time [place]: 60 Representative spoke with at [place]: Corene Cornea  Prior Living  Arrangements/Services Living arrangements for the past 2 months: [place] with:: Spouse Patient language and need for interpreter reviewed:: Yes Do you feel safe going back to the place where you live?: Yes      Need for Family Participation in Patient Care: Yes (Comment) Care giver support system in place?: Yes (comment)   Criminal Activity/Legal Involvement Pertinent to Current Situation/Hospitalization: No - Comment as needed  Activities of Daily Living   ADL Screening (condition at time of admission) Patient's cognitive ability adequate to safely complete daily activities?: No Is the patient deaf or have difficulty hearing?: No Does the patient have difficulty seeing, even when wearing glasses/contacts?: No Does the patient have difficulty concentrating, remembering, or making decisions?: No Patient able to express need for assistance with ADLs?: Yes Does the patient have difficulty dressing or bathing?: Yes Independently performs ADLs?: No Communication: Independent Dressing (OT): Needs assistance Is this a change from baseline?: Change from baseline, expected to last <3days Grooming: Needs assistance Feeding: Independent Bathing: Needs assistance Is this a change from baseline?: Change from baseline, expected to last <3 days Toileting: Needs assistance In/Out Bed: Independent Walks in Home: Independent Does the patient have difficulty walking or climbing stairs?: No Weakness of Legs: None Weakness of Arms/Hands: None  Permission Sought/Granted Permission sought to share information with : Facility Sport and exercise psychologist, Family Supports, Case Manager Permission granted to share information with : Yes, Verbal Permission Granted     Permission granted to share info w AGENCY: [facility]  Emotional Assessment Appearance:: Appears stated age Attitude/Demeanor/Rapport: Engaged Affect (typically observed): Appropriate Orientation: :  Oriented to Situation, Oriented to  Time, Oriented to Place, Oriented to Self Alcohol / Substance Use: Not Applicable Psych Involvement: No (comment)  Admission diagnosis:  Aspiration pneumonia ([place]) [J69.0] SOB (shortness of breath) [R06.02] Acute respiratory failure with hypoxia (HCC) [J96.01] Aspiration pneumonia of right lower lobe, unspecified aspiration pneumonia type ([place]) [J69.0] Patient Active Problem List   Diagnosis Date Noted   Malnutrition of moderate degree 08/17/2021   Acute respiratory failure with hypoxia ([place]) 08/16/2021   Severe sepsis ([place]) 08/16/2021   Sore throat 08/16/2021   Hx of subdural hematoma 08/16/2021   Nephrolithiasis 08/16/2021   Stage 3a chronic kidney disease (CKD) ([place]) 08/16/2021   Obesity (BMI 30-39.9) 08/16/2021   OSA (obstructive sleep apnea) 08/16/2021   Aspiration pneumonia ([place]) 08/15/2021   Snoring 08/08/2021   Frequent urination 06/07/2021   Acute lower UTI 06/07/2021   Ascending aortic aneurysm 06/07/2021   Atrophy of right kidney 06/07/2021   Anemia 06/07/2021   Obesity (BMI 30.0-34.9) 06/07/2021   Subdural hematoma 06/06/2021   COVID-19 virus infection 06/06/2021   Hypokalemia 06/06/2021   H/O ischemic right PCA stroke 06/06/2021   History of cardioembolic cerebrovascular accident (CVA) 06/06/2021   Right-sided cerebrovascular accident (CVA) ([place]) 04/14/2021   Nonfunctioning kidney 04/14/2021   Goals of care, counseling/discussion 04/03/2021   Ureteral stent occlusion ([place]) 04/02/2021   CKD (chronic kidney disease) stage 3a 03/11/2021   Major depressive disorder, recurrent episode, 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

## 2021-08-19 ENCOUNTER — Inpatient Hospital Stay (HOSPITAL_COMMUNITY): Payer: Medicare Other

## 2021-08-19 LAB — COMPREHENSIVE METABOLIC PANEL
ALT: 17 U/L (ref 0–44)
AST: 18 U/L (ref 15–41)
Albumin: 3.1 g/dL — ABNORMAL LOW (ref 3.5–5.0)
Alkaline Phosphatase: 55 U/L (ref 38–126)
Anion gap: 10 (ref 5–15)
BUN: 12 mg/dL (ref 8–23)
CO2: 27 mmol/L (ref 22–32)
Calcium: 9.7 mg/dL (ref 8.9–10.3)
Chloride: 107 mmol/L (ref 98–111)
Creatinine, Ser: 1.2 mg/dL (ref 0.61–1.24)
GFR, Estimated: >60 mL/min (ref >60)
Glucose, Bld: 117 mg/dL — ABNORMAL HIGH (ref 70–99)
Potassium: 3.6 mmol/L (ref 3.5–5.1)
Sodium: 144 mmol/L (ref 135–145)
Total Bilirubin: 0.8 mg/dL (ref 0.3–1.2)
Total Protein: 6.9 g/dL (ref 6.5–8.1)

## 2021-08-19 LAB — CBC WITH DIFFERENTIAL/PLATELET
Abs Immature Granulocytes: 0.14 10*3/uL — ABNORMAL HIGH (ref 0.00–0.07)
Basophils Absolute: 0.1 10*3/uL (ref 0.0–0.1)
Basophils Relative: 1 %
Eosinophils Absolute: 0.2 10*3/uL (ref 0.0–0.5)
Eosinophils Relative: 3 %
HCT: 44.9 % (ref 39.0–52.0)
Hemoglobin: 14.2 g/dL (ref 13.0–17.0)
Immature Granulocytes: 2 %
Lymphocytes Relative: 20 %
Lymphs Abs: 1.7 10*3/uL (ref 0.7–4.0)
MCH: 27.7 pg (ref 26.0–34.0)
MCHC: 31.6 g/dL (ref 30.0–36.0)
MCV: 87.5 fL (ref 80.0–100.0)
Monocytes Absolute: 0.5 10*3/uL (ref 0.1–1.0)
Monocytes Relative: 6 %
Neutro Abs: 6.1 10*3/uL (ref 1.7–7.7)
Neutrophils Relative %: 68 %
Platelets: 225 10*3/uL (ref 150–400)
RBC: 5.13 MIL/uL (ref 4.22–5.81)
RDW: 14.6 % (ref 11.5–15.5)
WBC: 8.7 10*3/uL (ref 4.0–10.5)
nRBC: 0 % (ref 0.0–0.2)

## 2021-08-19 LAB — GLUCOSE, CAPILLARY
Glucose-Capillary: 109 mg/dL — ABNORMAL HIGH (ref 70–99)
Glucose-Capillary: 110 mg/dL — ABNORMAL HIGH (ref 70–99)
Glucose-Capillary: 112 mg/dL — ABNORMAL HIGH (ref 70–99)
Glucose-Capillary: 132 mg/dL — ABNORMAL HIGH (ref 70–99)
Glucose-Capillary: 134 mg/dL — ABNORMAL HIGH (ref 70–99)
Glucose-Capillary: 135 mg/dL — ABNORMAL HIGH (ref 70–99)

## 2021-08-19 LAB — MAGNESIUM: Magnesium: 2.2 mg/dL (ref 1.7–2.4)

## 2021-08-19 LAB — PHOSPHORUS: Phosphorus: 2.9 mg/dL (ref 2.5–4.6)

## 2021-08-19 IMAGING — DX DG CHEST 1V PORT
1 series · 1 of 1 positions shown · non-contrast
Comparison: [DATE] and older studies.

CLINICAL DATA: Acute respiratory failure with hypoxia. Follow-up
exam.

EXAM:
PORTABLE CHEST 1 VIEW

[chest]
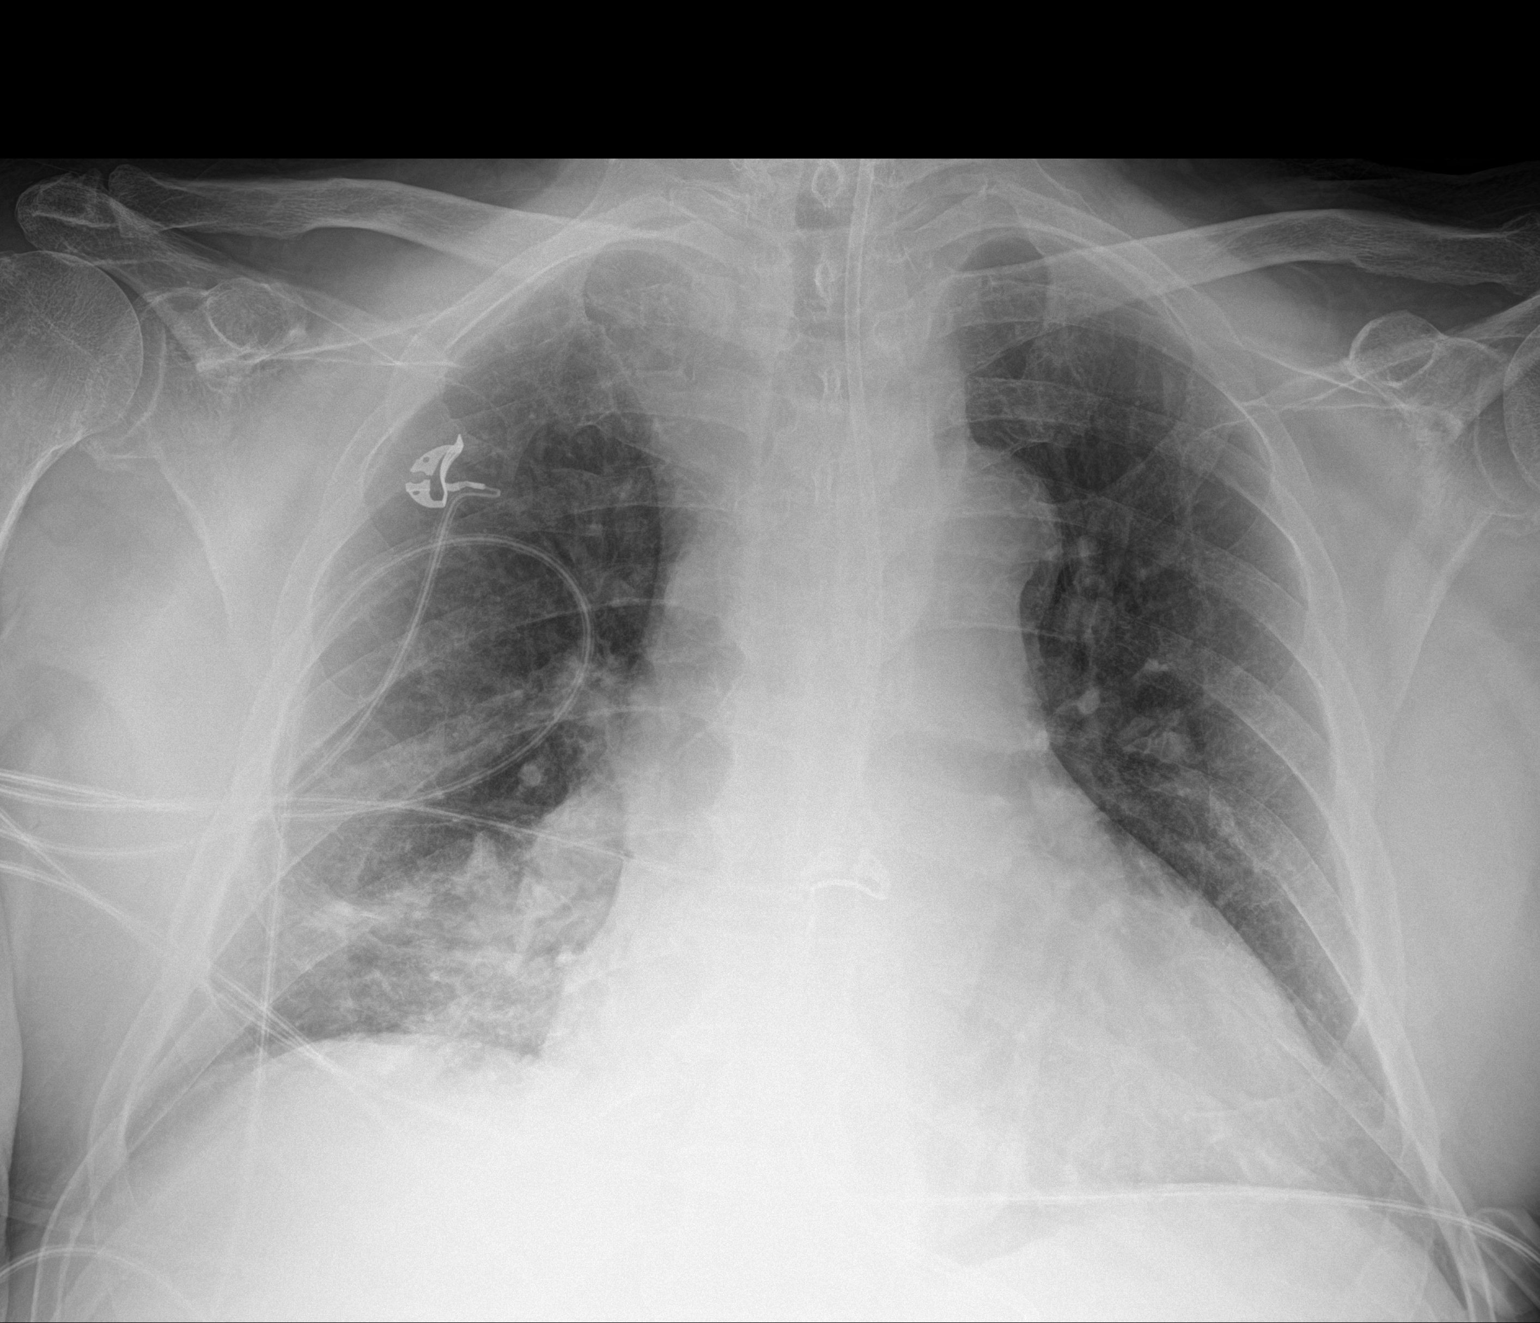

[1 of 1 positions shown; findings below may reference images not displayed]

FINDINGS: Cardiac silhouette mildly enlarged.  No mediastinal or hilar masses.

Right lung base opacity, without convincing change from the prior
study allowing for differences in patient positioning and lung
volumes. Remainder of the lungs is clear.

No convincing pleural effusion or pneumothorax.

Enteric tube passes below the diaphragm, unchanged.
IMPRESSION: 1. Right lung base opacity consistent with atelectasis and/or
pneumonia, without convincing change from the most recent prior
study, less prominent than it was on [DATE].
2. No other evidence of acute cardiopulmonary disease and no
convincing change from the previous day's study.

## 2021-08-19 MED ORDER — LEVALBUTEROL HCL 0.63 MG/3ML IN NEBU
1.2500 mg | INHALATION_SOLUTION | Freq: Three times a day (TID) | RESPIRATORY_TRACT | Status: DC
Start: 2021-08-20 — End: 2021-08-24
  Administered 2021-08-20: 0.63 mg via RESPIRATORY_TRACT
  Administered 2021-08-20: 1.25 mg via RESPIRATORY_TRACT
  Administered 2021-08-20: 0.63 mg via RESPIRATORY_TRACT
  Administered 2021-08-21 – 2021-08-22 (×5): 1.25 mg via RESPIRATORY_TRACT
  Administered 2021-08-22: 0.63 mg via RESPIRATORY_TRACT
  Administered 2021-08-23 (×2): 1.25 mg via RESPIRATORY_TRACT
  Filled 2021-08-19 (×15): qty 6

## 2021-08-19 MED ORDER — IPRATROPIUM BROMIDE 0.02 % IN SOLN
0.5000 mg | Freq: Three times a day (TID) | RESPIRATORY_TRACT | Status: DC
  Administered 2021-08-20 – 2021-08-23 (×11): 0.5 mg via RESPIRATORY_TRACT
  Filled 2021-08-19 (×13): qty 2.5

## 2021-08-19 MED ORDER — LEVALBUTEROL HCL 1.25 MG/0.5ML IN NEBU
1.2500 mg | INHALATION_SOLUTION | Freq: Four times a day (QID) | RESPIRATORY_TRACT | Status: DC | PRN
  Filled 2021-08-19: qty 0.5

## 2021-08-19 NOTE — Progress Notes (Signed)
Pt refuses CPAP for tonight.   

## 2021-08-19 NOTE — Progress Notes (Signed)
Possible some increased confusion this shift. Did not sleep much. Was not very cognizant of feeding tube leaking all over bed. Seemed slightly aloof when asked questions and needed repeating. Did not communicate nausea that he stated he had for a while this shift (given PRN zofran). Stated he was having trouble with his cellphone and couldn't figure it out to call his wife. A&Ox3 (disoriented to situation - not remembering why or how he got here). Given immodium for loose Bms in bed. Still coughing up significant amounts of tan/white sputum.

## 2021-08-19 NOTE — Progress Notes (Signed)
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  He was n.p.o. prior to the scan but decided to take his Prozac pill before going to scan did not use water and he felt like he choked on the pill.  He started having some mild trouble breathing and elevated blood pressure but decided to proceed with a CT scan anyway and then went home and then took his Norvasc and drink some juice.  He started having more trouble throughout the day with his breathing and I brought him to the ED and he was noted to be hypertensive, tachycardic and hypoxic.  Subsequently the patient had likely aspirated his pills and had to be placed on supplemental oxygen via nasal cannula and improved.  He had to be placed on 13 L of supplemental oxygen and when he came to the ED, he underwent a CT scan of the chest which showed aspiration findings.  He was admitted to the stepdown progressive unit and admitted for his hypoxia after aspiration.  Patient became extremely short of breath and began having a persistent cough and patient's wife has noticed that his voice is become more of a whisper.  Patient also complained of sore throat.  In the ED he was hypoxic requiring at least 13 L of supplemental oxygen which was then weaned to 8 L.  CTA of the chest was negative for PE but did show findings concerning for  aspiration.  His labs did show leukocytosis and patient was febrile with a temperature of 102 and a COVID test was negative.  He was initially on empiric antibiotics and admitted for further work-up and admitted for sepsis secondary to aspiration pneumonia.  **Interim History ENT was consulted for the edema of the supraglottis and they recommended initially evaluating with a scope but patient did not want to do this so they are holding off for now.  Given slightly worsening pulmonary status overnight ENT and no new recommendations recommended continue n.p.o. for now.  Overnight the patient appeared more short of breath and had a change in consistency and inquiry of his sputum.  He is no longer bringing up large amounts of thick copious green sputum and his chest x-ray revealed substantial increase in his right basilar opacity compared to his prior chest x-ray.  His antibiotics were changed to Unasyn and his tube feedings were discontinued given that there is concern for ongoing aspiration due to malfunctioning tube feeding leading to worsening aspiration.  His tube feeds were resumed today after his Cortrak was unclogged.  He was now off of supplemental oxygen yesterday morning when I saw him and his respiratory status is doing a little bit better.  Renato Gails he was more somnolent as he did not have a good night's rest. 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 SLP continues to recommend NPO.   Urology was consulted and patient had Neph tube 06/07/21. His Right Neph Tube is safe and continuing for now and patient to have rescheduling of his office follow up. Will continue   Assessment and Plan: * Acute respiratory failure with hypoxia ([place])- (present on admission) -In the setting of aspiration event and aspiration pneumonia -Patient aspirated on his Protonix pill and became  extremely short of breath going on for a CT scan for his last urology evaluation. -Initiated on empiric antibiotics with IV ceftriaxone and azithromycin which we will continue but this was changed to IV Unasyn given his worsening respiratory status -Continue treatment as below delineated in sepsis and aspiration pneumonia -SLP evaluated and recommending n.p.o. except ice chips -Given that he is n.p.o. we will place a small bore Cortrak for medication administration and tube feedings -Continue with speech therapy -SpO2: 95 % O2 Flow Rate (L/min): 2 L/min -Continuous pulse oximetry and maintain O2 saturations greater than 90% -Continue supplemental oxygen via nasal cannula wean O2 as tolerated -Patient's voice has become more whisper so a CT scan of the neck soft tissue was done and showed "Submucosal edema in the supraglottic larynx." -Repeat chest x-ray as below -ENT consulted about CT Soft Tissue Neck Findings and Dr. Janace Hoard will come assess the patient and he had recommended scope and the patient the patient refused at this time and will continue monitor patient's respiratory status carefully and then consider scoping in a few days but now recommending outpatient follow up.  -ENT feels that he would benefit from a vocal cord augmentation once he recovers    Malnutrition of moderate degree Nutrition Status: Nutrition Problem: Moderate Malnutrition (in the context of social/environmental circumstances) Etiology: dysphagia Signs/Symptoms: mild muscle depletion, mild fat depletion, percent weight loss (10.3% x 6 months) Percent weight loss: 10.3 % Interventions: Refer to RD note for recommendations     OSA (obstructive sleep apnea) -C/w CPAP qHS    Obesity (BMI 0000000) -Complicates overall prognosis and care -Estimated body mass index is 31.38 kg/m as calculated from the following:   Height as of this encounter: 5\' 8"  (1.727 m).   Weight as of this encounter: 93.6 kg.  -Weight Loss  and Dietary Counseling given  Stage 3a chronic kidney disease (CKD) (HCC) -Patient's BUN/creatinine is currently at baseline slightly worsened -BUN/creatinine went from 23/1.40 -> 17/1.58 ->  12/1.36 -> 12/1.27 -> 12/1.20 -Has a Right Nephrostomy tube -Avoid further nephrotoxic medications, consciousness, hypotension renally dose medications -Repeat CMP in the a.m.  Nephrolithiasis -Plan was to have a nephrectomy later this week -Patient sees Dr. Tresa Moore of Urology as an outpatient -CT Renal Stone study done as an outpatient which we need to get Results  -Notified Dr. Tresa Moore that patient is currently hospitalized with Aspiration as a courtesy; Dr. Tresa Moore has seen the patient and is planning on rescheduling outpatient appointment   Hx of subdural hematoma -Was presently obtaining CT in anticipation for a right-sided nephrectomy  later this week however he aspirated un the CT Scanner -Given concern for worsening weakness compared to his baseline we have ordered a MRI which showed no acute CVA -PT/OT to evaluate and Treat and recommending Home Health PT  Sore throat -CT Neck as Above  -CT of the neck done and showed "Submucosal edema in the supraglottic larynx." -ENT to Evaluate and appreciate Dr. Janace Hoard evaluating.  Dr. Janace Hoard had recommended a scope but patient has initially refused.  They recommend continuing  to follow to see if the patient proves for scoping.  His hoarseness is starting to improve a little bit and he continues to have no stridor or airway noise. -ENT recommends continuing n.p.o. for now and the patient is doing a little bit better and feel that his voice is respiratory with no breathing issues bilateral.  They feel that his vocal cord weakness that may be why he is aspirating more and they feel that a vocal cord augmentation may be helpful after he recovers more.  They are recommending outpatient evaluation  Severe sepsis ([place]) -Patient aspirated yesterday and presented with  fever, tachycardia, tachypnea and a source of infection with aspiration findings -Can continue empiric antibiotics with IV ceftriaxone and azithromycin and will obtain sputum cultures; overnight his respiratory status worsened so his antibiotics were changed to Unasyn -SLP done and recommending n.p.o. for now -Blood cultures not obtained on admission for unclear reasons so we will obtain now -Respiratory panel done by PCR showed negative influenza A and negative influenza B as well as SARS-CoV-2 testing negative by PCR -CTA of the chest PE protocol done and showed "Negative for acute pulmonary embolus. Interval finding of mildly nodular and ground-glass airspace disease in the right lung base with debris and or fluid within the bronchi, given clear lung bases on the CT performed earlier today, suspect that findings could be secondary to aspiration." -Repeat chest x-ray done and showed "Likely trace right pleural effusion.Interval increase in right base patchy airspace opacity. Finding could represent a combination of infection/inflammation versus atelectasis". -Will add Xopenex and Atrovent scheduled as well as flutter valve, incentive spirometer and guaifenesin via the Cortrak -Budesonide 0.25 mg nebs twice daily and continue monitor his respiratory status carefully -IVF now stopped  -Given his generalized weakness and worsening swallow function MRI was done to evaluate for CVA and this was negative -We will do sepsis complete work-up and obtain urinalysis however this may be invalid given that he is on antibiotics already -Continue to follow cultures; blood cultures x2 showed no growth to date at 2 Days -WBC went from 16.1 -> 14.2 -> 11.2 -> 8.9 -> 8.7 -NG Cortrak is being placed in the inability to swallow; will continue n.p.o. for now and will re-evaluate Swallowing per SLP reccs -Follow chest x-rays and if not improving may consult pulmonary for further evaluation recommendations    Aspiration  pneumonia ([place])- (present on admission) -Patient aspirated PTA and presented with fever, tachycardia, tachypnea and a source of infection with aspiration findings -Can continue empiric antibiotics with IV ceftriaxone and azithromycin and will obtain sputum cultures and this was changed to Unasyn -SLP done and recommending n.p.o. for now -Blood cultures not obtained on admission for unclear reasons so we will obtain now -Respiratory panel done by PCR showed negative influenza A and negative influenza B as well as SARS-CoV-2 testing negative by PCR -CTA of the chest PE protocol done and showed "Negative for acute pulmonary embolus. Interval finding of mildly nodular and ground-glass airspace disease in the right lung base with debris and or fluid within the bronchi, given clear lung bases on the CT performed earlier today, suspect that findings could be secondary to aspiration." -Repeat CXR done and showed "Likely trace right pleural effusion. Interval increase in right base patchy airspace opacity. Finding could represent a combination of infection/inflammation versus atelectasis." -Given his generalized weakness and worsening swallow function MRI was done to evaluate for CVA and this was negative -Started patient on Xopenex and Atrovent as well  as Pulmicort and will continue.  Also placed on Guaifenesin via tube as well as flutter valve incentive spirometry -We will do sepsis complete work-up and obtain urinalysis however this may be invalid given that he is on antibiotics already -Continue to follow cultures;  -WBC went from 16.1 -> 14.2 -> 11.2 -> 8.9 -> 8.7 -NG Cortrak is being placed in the inability to swallow -If not improving will consult Pulmonary and repeat chest x-ray in a.m. -Repeat CXR and showed "Right lung base opacity consistent with atelectasis and/or pneumonia, without convincing change from the most recent prior study, less prominent than it was on 08/16/2021. No other evidence of  acute cardiopulmonary disease and no convincing change from the previous day's study" -Continue to Monitor Respiratory Status carefully  H/O ischemic right PCA stroke -See Hx of Subdural Hematoma  Hypokalemia- (present on admission) -Patient's K+ went from 3.3 -> 2.9 -> 3.4 -> 3.0 -> 3.6 -Mag Level was 2.2 -Continue to Monitor and Replete as Necessary -Repeat CMP in the AM   Hypertension- (present on admission) -Continue n.p.o. and will initiate amlodipine through small bore feeding tube -Continue to monitor blood pressures per protocol -Last BP was 168/95  CKD (chronic kidney disease) stage 3a- (present on admission) -Patient's BUNs/creatinine is relatively stable last few days and is now 12/1.36 -> 12/1.27 -> 12/1.20 -IVF now stopped  -Avoid further nephrotoxic medications, contrast dyes, hypotension renally dose medications -Repeat CMP in a.m.   Nutrition Documentation    [facility] (Current) from 08/15/2021 in [facility]  Nutrition Problem Moderate Malnutrition  [in the context of social/environmental circumstances]  Etiology dysphagia  Nutrition Goal Patient will meet greater than or equal to 90% of their needs  Interventions Refer to RD note for recommendations      Subjective: Seen and examined at bedside and he is sitting up and doing fairly well.  Thinks he is doing better and denies any nausea or vomiting.  Able to suction out some of his secretions.  Able to cough up.  Still n.p.o. for now.  X-ray looks about the same.  No other concerns was this time but his oxygen is almost weaned off.  No other concerns or complaints at this time and his voice is sounding a little bit better but still very raspy.  Physical Exam: Vitals:   08/19/21 0121 08/19/21 0442 08/19/21 0753 08/19/21 1118  BP:  (!) 163/96 (!) 155/86 (!) 168/95  Pulse:  74 74 77  Resp:  19    Temp:  99.1 F (37.3 C) 97.8 F (36.6 C) 97.6 F (36.4 C)  TempSrc:   Oral Axillary Oral  SpO2: 99% 100% 97% 90%  Weight:  92.1 kg    Height:       Examination: Physical Exam:  Constitutional: WN/WD obese Caucasian male in NAD and appears calm  Respiratory: Diminished to auscultation bilaterally with coarse breath sounds and some rhonchi. Slight no wheezing, Normal respiratory effort and patient is not tachypenic. No accessory muscle use. Unlabored breathing and is wearing supplemental O2 via [place] [facility]: RRR, no murmurs / rubs / gallops. S1 and S2 auscultated. No extremity edema.  Abdomen: Soft, non-tender, non-distended. No masses palpated. No appreciable hepatosplenomegaly. Bowel sounds positive.  Musculoskeletal: No clubbing / cyanosis of digits/nails. No joint deformity upper and lower extremities.  Skin: No rashes, lesions, ulcers on a limited skin evaluation. No induration; Warm and dry.   Data Reviewed:  Independently reviewed and assessed the patient's  clinical laboratory data including a CBC and CMP.  Patient blood sugar is slightly high on his CMP and his albumin is low.  Family Communication: Extensively discussed with wife at bedside  Disposition: Status is: Inpatient Remains inpatient appropriate because: Still has Inability to [facility]   Planned Discharge Destination: Home with Home Health  DVT Prophylaxis: Enoxaparin 40 mg sq q24h  Author: Raiford Noble, DO [facility]  08/19/2021 3:06 PM  For on call review [email or web address].

## 2021-08-20 LAB — CBC WITH DIFFERENTIAL/PLATELET
Abs Immature Granulocytes: 0.12 10*3/uL — ABNORMAL HIGH (ref 0.00–0.07)
Basophils Absolute: 0 10*3/uL (ref 0.0–0.1)
Basophils Relative: 1 %
Eosinophils Absolute: 0.3 10*3/uL (ref 0.0–0.5)
Eosinophils Relative: 5 %
HCT: 40.1 % (ref 39.0–52.0)
Hemoglobin: 13.2 g/dL (ref 13.0–17.0)
Immature Granulocytes: 2 %
Lymphocytes Relative: 18 %
Lymphs Abs: 1.2 10*3/uL (ref 0.7–4.0)
MCH: 28.2 pg (ref 26.0–34.0)
MCHC: 32.9 g/dL (ref 30.0–36.0)
MCV: 85.7 fL (ref 80.0–100.0)
Monocytes Absolute: 0.5 10*3/uL (ref 0.1–1.0)
Monocytes Relative: 8 %
Neutro Abs: 4.6 10*3/uL (ref 1.7–7.7)
Neutrophils Relative %: 66 %
Platelets: 207 10*3/uL (ref 150–400)
RBC: 4.68 MIL/uL (ref 4.22–5.81)
RDW: 14.6 % (ref 11.5–15.5)
WBC: 6.8 10*3/uL (ref 4.0–10.5)
nRBC: 0 % (ref 0.0–0.2)

## 2021-08-20 LAB — COMPREHENSIVE METABOLIC PANEL
ALT: 19 U/L (ref 0–44)
AST: 16 U/L (ref 15–41)
Albumin: 2.9 g/dL — ABNORMAL LOW (ref 3.5–5.0)
Alkaline Phosphatase: 47 U/L (ref 38–126)
Anion gap: 8 (ref 5–15)
BUN: 16 mg/dL (ref 8–23)
CO2: 30 mmol/L (ref 22–32)
Calcium: 9.4 mg/dL (ref 8.9–10.3)
Chloride: 106 mmol/L (ref 98–111)
Creatinine, Ser: 1.15 mg/dL (ref 0.61–1.24)
GFR, Estimated: >60 mL/min (ref >60)
Glucose, Bld: 124 mg/dL — ABNORMAL HIGH (ref 70–99)
Potassium: 3.4 mmol/L — ABNORMAL LOW (ref 3.5–5.1)
Sodium: 144 mmol/L (ref 135–145)
Total Bilirubin: 0.3 mg/dL (ref 0.3–1.2)
Total Protein: 6.5 g/dL (ref 6.5–8.1)

## 2021-08-20 LAB — GLUCOSE, CAPILLARY
Glucose-Capillary: 109 mg/dL — ABNORMAL HIGH (ref 70–99)
Glucose-Capillary: 113 mg/dL — ABNORMAL HIGH (ref 70–99)
Glucose-Capillary: 115 mg/dL — ABNORMAL HIGH (ref 70–99)
Glucose-Capillary: 142 mg/dL — ABNORMAL HIGH (ref 70–99)
Glucose-Capillary: 144 mg/dL — ABNORMAL HIGH (ref 70–99)

## 2021-08-20 LAB — MAGNESIUM: Magnesium: 2 mg/dL (ref 1.7–2.4)

## 2021-08-20 LAB — PHOSPHORUS: Phosphorus: 3.6 mg/dL (ref 2.5–4.6)

## 2021-08-20 MED ORDER — POTASSIUM CHLORIDE 20 MEQ PO PACK
40.0000 meq | PACK | Freq: Two times a day (BID) | ORAL | Status: AC
  Administered 2021-08-20 (×2): 40 meq
  Filled 2021-08-20 (×2): qty 2

## 2021-08-20 NOTE — Progress Notes (Signed)
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  He was n.p.o. prior to the scan but decided to take his Prozac pill before going to scan did not use water and he felt like he choked on the pill.  He started having some mild trouble breathing and elevated blood pressure but decided to proceed with a CT scan anyway and then went home and then took his Norvasc and drink some juice.  He started having more trouble throughout the day with his breathing and I brought him to the ED and he was noted to be hypertensive, tachycardic and hypoxic.  Subsequently the patient had likely aspirated his pills and had to be placed on supplemental oxygen via nasal cannula and improved.  He had to be placed on 13 L of supplemental oxygen and when he came to the ED, he underwent a CT scan of the chest which showed aspiration findings.  He was admitted to the stepdown progressive unit and admitted for his hypoxia after aspiration.  Patient became extremely short of breath and began having a persistent cough and patient's wife has noticed that his voice is become more of a whisper.  Patient also complained of sore throat.  In the ED he was hypoxic requiring at least 13 L of supplemental oxygen which was then weaned to 8 L.  CTA of the chest was negative for PE but did show findings concerning for  aspiration.  His labs did show leukocytosis and patient was febrile with a temperature of 102 and a COVID test was negative.  He was initially on empiric antibiotics and admitted for further work-up and admitted for sepsis secondary to aspiration pneumonia.  **Interim History ENT was consulted for the edema of the supraglottis and they recommended initially evaluating with a scope but patient did not want to do this so they are holding off for now.  Given slightly worsening pulmonary status overnight ENT and no new recommendations recommended continue n.p.o. for now.  Overnight the patient appeared more short of breath and had a change in consistency and inquiry of his sputum.  He is no longer bringing up large amounts of thick copious green sputum and his chest x-ray revealed substantial increase in his right basilar opacity compared to his prior chest x-ray.  His antibiotics were changed to Unasyn and his tube feedings were discontinued given that there is concern for ongoing aspiration due to malfunctioning tube feeding leading to worsening aspiration.  His tube feeds were resumed today after his Cortrak was unclogged.  He was now off of supplemental oxygen yesterday morning when I saw him and his respiratory status is doing a little bit better.  The day before he was more somnolent as he did not have a good night's rest. 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 SLP continues to recommend NPO.   Urology was consulted and patient had Neph tube 06/07/21. His Right Neph Tube is safe and continuing for now and patient to have rescheduling of his office follow up. Will continue to monitor  Patient's K+ is low on 3.4 today and will replete. Will repeat CXR in the AM and continue to Monitor Respiratory Status carefully today.   Assessment and Plan: * Acute respiratory failure with  hypoxia ([place])- (present on admission) -In the setting of aspiration event and aspiration pneumonia -Patient aspirated on his Protonix pill and became extremely short of breath going on for a CT scan for his last urology evaluation. -Initiated on empiric antibiotics with IV ceftriaxone and azithromycin which we will continue but this was changed to IV Unasyn given his worsening respiratory status -Continue treatment as below delineated in sepsis and aspiration pneumonia -SLP evaluated and recommending n.p.o. except ice chips -Given that he is n.p.o. we will place a small bore Cortrak for medication administration and tube feedings -Continue with speech therapy -SpO2: 100 % O2 Flow Rate (L/min): 4 L/min; Was almost weaned off of O2 yesterday but now back on it  -Continuous pulse oximetry and maintain O2 saturations greater than 90% -Continue supplemental oxygen via nasal cannula wean O2 as tolerated -Patient's voice has become more whisper so a CT scan of the neck soft tissue was done and showed "Submucosal edema in the supraglottic larynx." -Repeat chest x-ray as below -ENT consulted about CT Soft Tissue Neck Findings and Dr. Janace Hoard will come assess the patient and he had recommended scope and the patient the patient refused at this time and will continue monitor patient's respiratory status carefully and then consider scoping in a few days but now recommending outpatient follow up.  -ENT feels that he would benefit from a vocal cord augmentation once he recovers -Repeat CXR in the AM    Malnutrition of moderate degree Nutrition Status: Nutrition Problem: Moderate Malnutrition (in the context of social/environmental circumstances) Etiology: dysphagia Signs/Symptoms: mild muscle depletion, mild fat depletion, percent weight loss (10.3% x 6 months) Percent weight loss: 10.3 % Interventions: Refer to RD note for recommendations     OSA (obstructive sleep apnea) -C/w CPAP qHS    Obesity  (BMI 0000000) -Complicates overall prognosis and care -Estimated body mass index is 31.38 kg/m as calculated from the following:   Height as of this encounter: 5\' 8"  (1.727 m).   Weight as of this encounter: 93.6 kg.  -Weight Loss and Dietary Counseling given  Stage 3a chronic kidney disease (CKD) (HCC) -Patient's BUN/creatinine is currently at baseline slightly worsened -BUN/creatinine went from 23/1.40 -> 17/1.58 ->  12/1.36 -> 12/1.27 -> 12/1.20 -> 16/1.15 -Has a Right Nephrostomy tube -Avoid further nephrotoxic medications, consciousness, hypotension renally dose medications -Repeat CMP in the a.m.  Nephrolithiasis -Plan was to have a nephrectomy later this week -Patient sees Dr. Tresa Moore of Urology as an outpatient -CT Renal Stone study done as an outpatient which we need to get Results  -Notified Dr. Tresa Moore that patient is currently hospitalized with Aspiration as a courtesy; Dr. Tresa Moore has seen the patient and is planning on rescheduling outpatient appointment   Hx of subdural hematoma -Was presently obtaining CT in anticipation for a right-sided nephrectomy  later this week however he aspirated un the CT Scanner -Given concern for worsening weakness compared to his baseline we have ordered a MRI which showed no acute CVA -PT/OT to evaluate and Treat and  recommending Home Health PT once ready to be D/C'd home  Sore throat -CT Neck as Above  -CT of the neck done and showed "Submucosal edema in the supraglottic larynx." -ENT to Evaluate and appreciate Dr. Janace Hoard evaluating.  Dr. Janace Hoard had recommended a scope but patient has initially refused.  They recommend continuing to follow to see if the patient proves for scoping.  His hoarseness is starting to improve a little bit and he continues to have no stridor or airway noise. -ENT recommends continuing n.p.o. for now and the patient is doing a little bit better and feel that his voice is respiratory with no breathing issues bilateral.  They  feel that his vocal cord weakness that may be why he is aspirating more and they feel that a vocal cord augmentation may be helpful after he recovers more.  They are recommending outpatient evaluation  Severe sepsis ([place]) -Patient aspirated yesterday and presented with fever, tachycardia, tachypnea and a source of infection with aspiration findings -Can continue empiric antibiotics with IV ceftriaxone and azithromycin and will obtain sputum cultures; overnight his respiratory status worsened so his antibiotics were changed to Unasyn -SLP done and recommending n.p.o. for now -Blood cultures not obtained on admission for unclear reasons so we will obtain now -Respiratory panel done by PCR showed negative influenza A and negative influenza B as well as SARS-CoV-2 testing negative by PCR -CTA of the chest PE protocol done and showed "Negative for acute pulmonary embolus. Interval finding of mildly nodular and ground-glass airspace disease in the right lung base with debris and or fluid within the bronchi, given clear lung bases on the CT performed earlier today, suspect that findings could be secondary to aspiration." -Last chest x-ray done and showed "Likely trace right pleural effusion.Interval increase in right base patchy airspace opacity. Finding could represent a combination of infection/inflammation versus atelectasis". -Will continue Xopenex and Atrovent scheduled as well as flutter valve, incentive spirometer and guaifenesin via the Cortrak -Budesonide 0.25 mg nebs twice daily and continue monitor his respiratory status carefully -IVF now stopped  -Given his generalized weakness and worsening swallow function MRI was done to evaluate for CVA and this was negative -We will do sepsis complete work-up and obtain urinalysis however this may be invalid given that he is on antibiotics already -Continue to follow cultures; blood cultures x2 showed no growth to date at 2 Days -WBC went from 16.1 -> 14.2  -> 11.2 -> 8.9 -> 8.7 -> 6.8 -NG Cortrak is being placed in the inability to swallow; will continue n.p.o. for now and will re-evaluate Swallowing per SLP reccs -Follow chest x-rays and if not improving may consult pulmonary for further evaluation recommendations    Aspiration pneumonia ([place])- (present on admission) -Patient aspirated PTA and presented with fever, tachycardia, tachypnea and a source of infection with aspiration findings -Can continue empiric antibiotics with IV ceftriaxone and azithromycin and will obtain sputum cultures and this was changed to Unasyn -SLP done and recommending n.p.o. for now -Blood cultures not obtained on admission for unclear reasons so we will obtain now -Respiratory panel done by PCR showed negative influenza A and negative influenza B as well as SARS-CoV-2 testing negative by PCR -CTA of the chest PE protocol done and showed "Negative for acute pulmonary embolus. Interval finding of mildly nodular and ground-glass airspace disease in the right lung base with debris and or fluid within the bronchi, given clear lung bases on the CT performed earlier today, suspect that findings could be  secondary to aspiration." -Repeat CXR done and showed "Likely trace right pleural effusion. Interval increase in right base patchy airspace opacity. Finding could represent a combination of infection/inflammation versus atelectasis." -Given his generalized weakness and worsening swallow function MRI was done to evaluate for CVA and this was negative -Started patient on Xopenex and Atrovent as well as Pulmicort and will continue.  Also placed on Guaifenesin via tube as well as flutter valve incentive spirometry -We will do sepsis complete work-up and obtain urinalysis however this may be invalid given that he is on antibiotics already -Continue to follow cultures;  -WBC went from 16.1 -> 14.2 -> 11.2 -> 8.9 -> 8.7 -> 6.8 -NG Cortrak is being placed in the inability to  swallow -If not improving will consult Pulmonary and repeat chest x-ray in a.m. -Repeat CXR and showed "Right lung base opacity consistent with atelectasis and/or pneumonia, without convincing change from the most recent prior study, less prominent than it was on 08/16/2021. No other evidence of acute cardiopulmonary disease and no convincing change from the previous day's study" -Continue to Monitor Respiratory Status carefully  H/O ischemic right PCA stroke -See Hx of Subdural Hematoma  Hypokalemia- (present on admission) -Patient's K+ went from 3.3 -> 2.9 -> 3.4 -> 3.0 -> 3.6 -> 3.4 -Will replete with po KCl 40 mEQ BID x2 -Mag Level is now 2.0 -Continue to Monitor and Replete as Necessary -Repeat CMP in the AM   Hypertension- (present on admission) -Continue n.p.o. and will initiate amlodipine through small bore feeding tube -Continue to monitor blood pressures per protocol -Last BP was 154/79  CKD (chronic kidney disease) stage 3a- (present on admission) -Patient's BUNs/creatinine is relatively stable last few days and is now 12/1.36 -> 12/1.27 -> 12/1.20 -> 16/1.15 -IVF now stopped  -Avoid further nephrotoxic medications, contrast dyes, hypotension renally dose medications -Repeat CMP in a.m.   Nutrition Documentation    [facility] to Hosp-Admission (Current) from 08/15/2021 in [facility]  Nutrition Problem Moderate Malnutrition  [in the context of social/environmental circumstances]  Etiology dysphagia  Nutrition Goal Patient will meet greater than or equal to 90% of their needs  Interventions Refer to RD note for recommendations   Subjective: Seen and examined at bedside he is back on little bit more oxygen today.  He was resting somnolent and drowsy but easily arousable and not complain breathing.  States that he is shortness of breath is stable.  No other concerns or complaints at this time and wants to sleep  Physical Exam: Vitals:   08/20/21  0820 08/20/21 0950 08/20/21 0958 08/20/21 1135  BP:  (!) 165/104 127/83 (!) 154/79  Pulse:      Resp: 18  16   Temp:    98.4 F (36.9 C)  TempSrc:    Oral  SpO2:   100%   Weight:      Height:       Examination: Physical Exam:  Constitutional: WN/WD obese Caucasian male who is somnolent and drowsy in NAD and appears calm  ENMT: External Ears, Nose appear normal. Has a Cortrak in place Respiratory: Diminished to auscultation bilaterally with coarse breath sounds with some slight crackles and mild rhonchi.  No appreciable wheezing and has a normal respiratory effort without accessory muscle use.  Wearing supplemental oxygen via nasal cannula Cardiovascular: RRR, no murmurs / rubs / gallops. S1 and S2 auscultated.  Trace lower extremity edema Abdomen: Soft, non-tender, distended secondary body habitus. Bowel sounds positive.  GU: Deferred.  Has a right PCN that is draining Skin: No rashes, lesions, ulcers on limited skin evaluation. No induration; Warm and dry.   Data Reviewed:  I have independently reviewed and assessed the patient's clinical laboratory data including a CMP and CBC  He has a slight hypokalemia today with a potassium of 3.4.  CBGs have been relatively stable ranging from 113-144 and his albumin is low at 2.9  Family Communication: No family currently at bedside  Disposition: Status is: Inpatient Remains inpatient appropriate because: As a inability to swallow and may need a G-tube if continues to be n.p.o. per SLP recommendations  Planned Discharge Destination: Home with Home Health  DVT prophylaxis: Enoxaparin 4 mg subcu  Author: Kerney Elbe, DO [facility]  08/20/2021 3:17 PM  For on call review [email or web address].

## 2021-08-21 ENCOUNTER — Inpatient Hospital Stay (HOSPITAL_COMMUNITY): Payer: Medicare Other

## 2021-08-21 LAB — COMPREHENSIVE METABOLIC PANEL
ALT: 20 U/L (ref 0–44)
AST: 17 U/L (ref 15–41)
Albumin: 2.9 g/dL — ABNORMAL LOW (ref 3.5–5.0)
Alkaline Phosphatase: 50 U/L (ref 38–126)
Anion gap: 10 (ref 5–15)
BUN: 19 mg/dL (ref 8–23)
CO2: 27 mmol/L (ref 22–32)
Calcium: 9.5 mg/dL (ref 8.9–10.3)
Chloride: 107 mmol/L (ref 98–111)
Creatinine, Ser: 1.26 mg/dL — ABNORMAL HIGH (ref 0.61–1.24)
GFR, Estimated: 60 mL/min — ABNORMAL LOW (ref >60)
Glucose, Bld: 103 mg/dL — ABNORMAL HIGH (ref 70–99)
Potassium: 4.1 mmol/L (ref 3.5–5.1)
Sodium: 144 mmol/L (ref 135–145)
Total Bilirubin: 0.5 mg/dL (ref 0.3–1.2)
Total Protein: 6.5 g/dL (ref 6.5–8.1)

## 2021-08-21 LAB — CULTURE, BLOOD (ROUTINE X 2)
Culture: NO GROWTH
Culture: NO GROWTH
Special Requests: ADEQUATE
Special Requests: ADEQUATE

## 2021-08-21 LAB — CBC WITH DIFFERENTIAL/PLATELET
Abs Immature Granulocytes: 0.15 10*3/uL — ABNORMAL HIGH (ref 0.00–0.07)
Basophils Absolute: 0 10*3/uL (ref 0.0–0.1)
Basophils Relative: 1 %
Eosinophils Absolute: 0.3 10*3/uL (ref 0.0–0.5)
Eosinophils Relative: 4 %
HCT: 43.1 % (ref 39.0–52.0)
Hemoglobin: 13.5 g/dL (ref 13.0–17.0)
Immature Granulocytes: 2 %
Lymphocytes Relative: 19 %
Lymphs Abs: 1.5 10*3/uL (ref 0.7–4.0)
MCH: 27.6 pg (ref 26.0–34.0)
MCHC: 31.3 g/dL (ref 30.0–36.0)
MCV: 88.1 fL (ref 80.0–100.0)
Monocytes Absolute: 0.7 10*3/uL (ref 0.1–1.0)
Monocytes Relative: 9 %
Neutro Abs: 5.1 10*3/uL (ref 1.7–7.7)
Neutrophils Relative %: 65 %
Platelets: 237 10*3/uL (ref 150–400)
RBC: 4.89 MIL/uL (ref 4.22–5.81)
RDW: 14.8 % (ref 11.5–15.5)
WBC: 7.7 10*3/uL (ref 4.0–10.5)
nRBC: 0 % (ref 0.0–0.2)

## 2021-08-21 LAB — GLUCOSE, CAPILLARY
Glucose-Capillary: 107 mg/dL — ABNORMAL HIGH (ref 70–99)
Glucose-Capillary: 113 mg/dL — ABNORMAL HIGH (ref 70–99)
Glucose-Capillary: 119 mg/dL — ABNORMAL HIGH (ref 70–99)
Glucose-Capillary: 121 mg/dL — ABNORMAL HIGH (ref 70–99)
Glucose-Capillary: 132 mg/dL — ABNORMAL HIGH (ref 70–99)
Glucose-Capillary: 160 mg/dL — ABNORMAL HIGH (ref 70–99)

## 2021-08-21 LAB — MAGNESIUM: Magnesium: 2.3 mg/dL (ref 1.7–2.4)

## 2021-08-21 LAB — PHOSPHORUS: Phosphorus: 4.5 mg/dL (ref 2.5–4.6)

## 2021-08-21 IMAGING — DX DG CHEST 1V PORT
1 series · 1 of 1 positions shown · non-contrast
Comparison: Previous studies including the examination of
[DATE]

CLINICAL DATA: Shortness of breath

EXAM:
PORTABLE CHEST 1 VIEW

[chest ap]
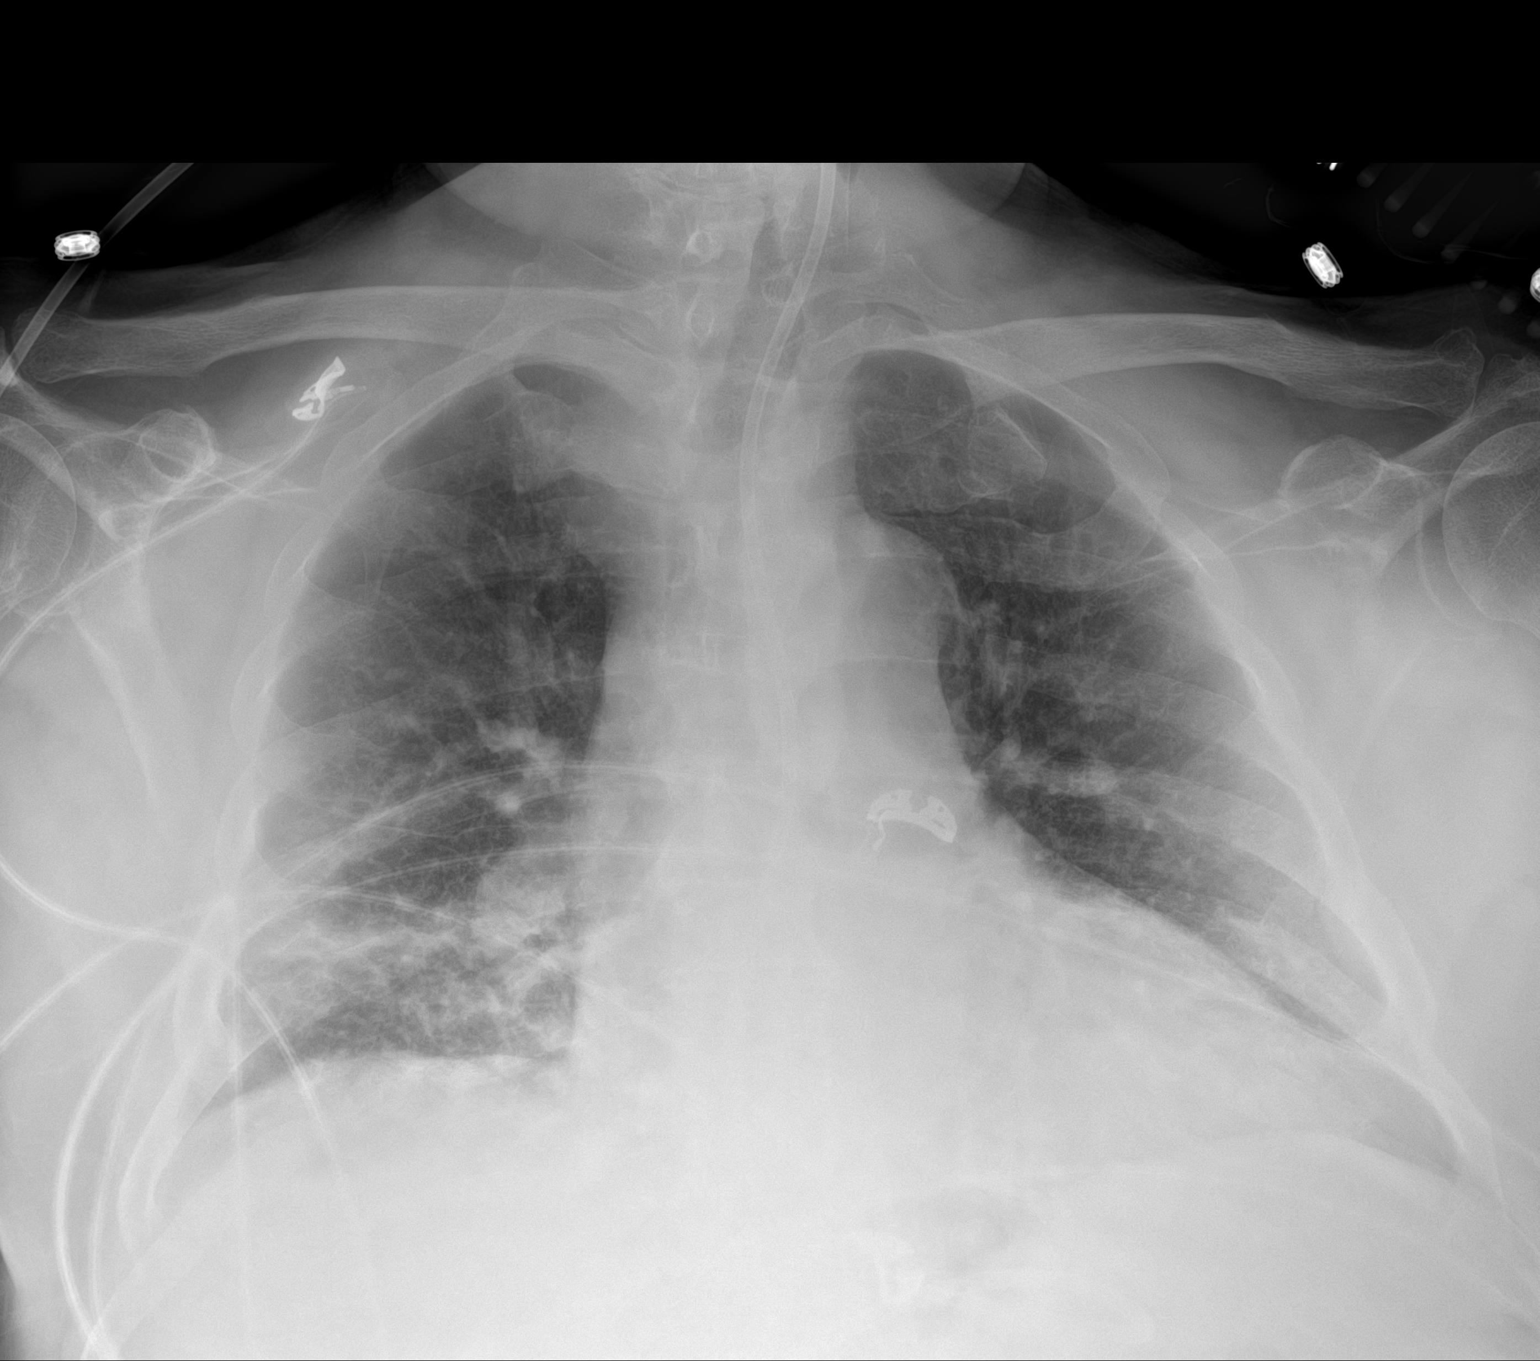

[1 of 1 positions shown; findings below may reference images not displayed]

FINDINGS: Transverse diameter of heart is increased. There are patchy
infiltrates in right lower lung fields with possible slight
improvement. No new infiltrates are seen. There are no signs of
alveolar pulmonary edema. Enteric tube is noted traversing the
esophagus.
IMPRESSION: Patchy infiltrates are seen in the right lower lung fields
suggesting atelectasis/pneumonia with interval improvement.

## 2021-08-21 MED ORDER — FREE WATER
125.0000 mL | Status: DC
  Administered 2021-08-21 – 2021-08-23 (×10): 125 mL

## 2021-08-21 NOTE — Progress Notes (Signed)
Physical Therapy Treatment Patient Details Name: Nicholas Mckenzie MRN: [id number] DOB: 11-23-1945 Today's Date: 08/21/2021   History of Present Illness 76 y.o. male admitted 2/21 with SOB and tachycardia with possibly choking on pill. Pt with acute respiratory failure. PMhx. 06/06/2021 fall with SDH, CKD stage IIIa, nephrostomy tube, sleep apnea, HTN, OSA, depression/anxiety, CVA with residual left hemiparesis.    PT Comments    Pt pleasant and very willing to mobilize. Pt on 4L on arrival at 98% but able to maintain sats >95% on 3L with activity. Pt with education for HEp and assist with mobility. HHPT remains appropriate and will continue to follow.     Recommendations for follow up therapy are one component of a multi-disciplinary discharge planning process, led by the attending physician.  Recommendations may be updated based on patient status, additional functional criteria and insurance authorization.  Follow Up Recommendations  Home health PT     Assistance Recommended at Discharge Intermittent Supervision/Assistance  Patient can return home with the following A little help with bathing/dressing/bathroom;A little help with walking and/or transfers;Assist for transportation;Direct supervision/assist for financial management;Direct supervision/assist for medications management   Equipment Recommendations  None recommended by PT    Recommendations for Other Services       Precautions / Restrictions Precautions Precautions: Fall Precaution Comments: cortrak, nephrostomy tube     Mobility  Bed Mobility               General bed mobility comments: pt in chair on arrival and end of session    Transfers Overall transfer level: Needs assistance   Transfers: Sit to/from Stand Sit to Stand: Min guard           General transfer comment: cues for hand placement and safety    Ambulation/Gait Ambulation/Gait assistance: Min guard Gait Distance (Feet): 100 Feet Assistive  device: Rolling walker (2 wheels) Gait Pattern/deviations: Step-through pattern, Decreased stride length, Decreased stance time - left   Gait velocity interpretation: <1.8 ft/sec, indicate of risk for recurrent falls   General Gait Details: cues for direction to avoid obstacles, decreased gait tolerance this session with less physical assist required   Stairs             Wheelchair Mobility    Modified Rankin (Stroke Patients Only)       Balance     Sitting balance-Leahy Scale: Fair     Standing balance support: Bilateral upper extremity supported, Single extremity supported, No upper extremity supported Standing balance-Leahy Scale: Fair Standing balance comment: single UE support at toilet, no UE assist washing hands and RW for gait                            Cognition Arousal/Alertness: Awake/alert Behavior During Therapy: WFL for tasks assessed/performed Overall Cognitive Status: No family/caregiver present to determine baseline cognitive functioning Area of Impairment: Orientation, Memory                 Orientation Level: Disoriented to, Time   Memory: Decreased short-term memory                  Exercises General Exercises - Lower Extremity Long Arc Quad: AROM, Both, Seated, 20 reps Hip Flexion/Marching: AROM, Both, Seated, 20 reps    General Comments        Pertinent Vitals/Pain Pain Assessment Pain Assessment: No/denies pain    Home Living  Prior Function            PT Goals (current goals can now be found in the care plan section) Progress towards PT goals: Progressing toward goals    Frequency    Min 3X/week      PT Plan Current plan remains appropriate    Co-evaluation              AM-PAC PT "6 Clicks" Mobility   Outcome Measure  Help needed turning from your back to your side while in a flat bed without using bedrails?: A Little Help needed moving from lying  on your back to sitting on the side of a flat bed without using bedrails?: A Little Help needed moving to and from a bed to a chair (including a wheelchair)?: A Little Help needed standing up from a chair using your arms (e.g., wheelchair or bedside chair)?: A Little Help needed to walk in hospital room?: A Little Help needed climbing 3-5 steps with a railing? : A Lot 6 Click Score: 17    End of Session Equipment Utilized During Treatment: Oxygen Activity Tolerance: Patient tolerated treatment well Patient left: in chair;with call bell/phone within reach;with nursing/sitter in room;with chair alarm set Nurse Communication: Mobility status PT Visit Diagnosis: Other abnormalities of gait and mobility (R26.89);Difficulty in walking, not elsewhere classified (R26.2);Muscle weakness (generalized) (M62.81)     Time: [id number] PT Time Calculation (min) (ACUTE ONLY): 27 min  Charges:  $Gait Training: 8-22 mins $Therapeutic Activity: 8-22 mins                     Djuana Littleton P, PT Acute Rehabilitation Services Pager: [phone] Office: [place] 08/21/2021, 12:33 PM

## 2021-08-21 NOTE — Progress Notes (Addendum)
Nutrition Follow-up  DOCUMENTATION CODES:  Non-severe (moderate) malnutrition in context of social or environmental circumstances  INTERVENTION:  Continue tube feeding via cortrak tube: Osmolite 1.5 at 12ml/h (1440 ml per day).  Prosource TF 45 ml daily Free water q4h Regimen provides 2200 kcal, 101 gm protein, 1847 ml free water daily  NUTRITION DIAGNOSIS:  Moderate Malnutrition (in the context of social/environmental circumstances) related to dysphagia as evidenced by mild muscle depletion, mild fat depletion, percent weight loss (10.3% x 6 months). - remains applicable  GOAL:  Patient will meet greater than or equal to 90% of their needs - being addressed through TF  MONITOR:  TF tolerance, Labs  REASON FOR ASSESSMENT:  Other (Comment) (new cortrack placement)    ASSESSMENT:  76 y.o. male with history of HTN, CKD3, and sleep apnea presented to ED with SOB worsening throughout the day. In ED, noted to be hypertensive, tachycardic and hypoxic. Attributed this to aspiration event earlier in the day.  Pt resting in bedside chair at the time of assessment. TF currently infusing at goal. Pt reports good tolerance, no abdominal pain or nausea at this time. Pt does report feeling hungry. Weight stable since admission. Could consider changing TF to less concentrated formula to provide more volume but equivalent nutrition amount.  Last SLP evaluation 2/24 - continue to recommend pt be NPO. Will monitor for diet advancement. If unable to make progress with PO diet, may need to consider g-tube for discharge.  Nutritionally Relevant Medications: Scheduled Meds:  feeding supplement (PROSource TF)  45 mL Per Tube Daily   free water  100 mL Per Tube Q4H   Continuous Infusions:  ampicillin-sulbactam (UNASYN) IV 3 g (08/21/21 0504)   OSMOLITE 1.5 CAL 1,000 mL (08/19/21 1859)   PRN Meds: loperamide, ondansetron  Labs Reviewed: Creatinine 1.26 SBG ranges from 103-160 mg/dL over  the last 24 hours   NUTRITION - FOCUSED PHYSICAL EXAM: Flowsheet Row Most Recent Value  Orbital Region Mild depletion  Upper Arm Region Mild depletion  Thoracic and Lumbar Region Mild depletion  Buccal Region Mild depletion  Temple Region No depletion  Clavicle Bone Region Mild depletion  Clavicle and Acromion Bone Region No depletion  Scapular Bone Region No depletion  Patellar Region Mild depletion  Anterior Thigh Region Mild depletion  Posterior Calf Region Mild depletion  Edema (RD Assessment) None  Hair Reviewed  Eyes Reviewed  Mouth Reviewed  Skin Reviewed  Nails Reviewed   Diet Order:   Diet Order     None       EDUCATION NEEDS:  Education needs have been addressed  Skin:  Skin Assessment: Reviewed RN Assessment  Last BM:  2/25 - type 7  Height:  Ht Readings from Last 1 Encounters:  08/16/21 5\' 8"  (1.727 m)    Weight:  Wt Readings from Last 1 Encounters:  08/21/21 93 kg    Ideal Body Weight:  70 kg  BMI:  Body mass index is 31.17 kg/m.  Estimated Nutritional Needs:  Kcal:  2000-2200 kcal/d Protein:  100-115 g/d Fluid:  >2 L/d   08/23/21, RD, LDN Clinical Dietitian RD pager # available in [facility]  After hours/weekend pager # available in [facility]

## 2021-08-21 NOTE — Progress Notes (Signed)
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  He was n.p.o. prior to the scan but decided to take his Prozac pill before going to scan did not use water and he felt like he choked on the pill.  He started having some mild trouble breathing and elevated blood pressure but decided to proceed with a CT scan anyway and then went home and then took his Norvasc and drink some juice.  He started having more trouble throughout the day with his breathing and I brought him to the ED and he was noted to be hypertensive, tachycardic and hypoxic.  Subsequently the patient had likely aspirated his pills and had to be placed on supplemental oxygen via nasal cannula and improved.  He had to be placed on 13 L of supplemental oxygen and when he came to the ED, he underwent a CT scan of the chest which showed aspiration findings.  He was admitted to the stepdown progressive unit and admitted for his hypoxia after aspiration.  Patient became extremely short of breath and began having a persistent cough and patient's wife has noticed that his voice is become more of a whisper.  Patient also complained of sore throat.  In the ED he was hypoxic requiring at least 13 L of supplemental oxygen which was then weaned to 8 L.  CTA of the chest was negative for PE but did show findings concerning for  aspiration.  His labs did show leukocytosis and patient was febrile with a temperature of 102 and a COVID test was negative.  He was initially on empiric antibiotics and admitted for further work-up and admitted for sepsis secondary to aspiration pneumonia.  **Interim History ENT was consulted for the edema of the supraglottis and they recommended initially evaluating with a scope but patient did not want to do this so they are holding off for now.  Given slightly worsening pulmonary status overnight ENT and no new recommendations recommended continue n.p.o. for now.  Overnight the patient appeared more short of breath and had a change in consistency and inquiry of his sputum.  He is no longer bringing up large amounts of thick copious green sputum and his chest x-ray revealed substantial increase in his right basilar opacity compared to his prior chest x-ray.  His antibiotics were changed to Unasyn and his tube feedings were discontinued given that there is concern for ongoing aspiration due to malfunctioning tube feeding leading to worsening aspiration.  His tube feeds were resumed today after his Cortrak was unclogged.  He was now off of supplemental oxygen yesterday morning when I saw him and his respiratory status is doing a little bit better.  The day before he was more somnolent as he did not have a good night's rest. 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 SLP continues to recommend NPO.   Urology was consulted and patient had Neph tube 06/07/21. His Right Neph Tube is safe and continuing for now and patient to have rescheduling of his office follow up. Will continue to monitor  Patient's K+ is low on 3.4 yesterday and will replete.  Continue to monitor his respiratory status carefully and repeat chest x-ray this morning shows some interval improvement.  Will obtain  repeat SLP evaluation to assess his swallowing.  This is still pending to be done  Assessment and Plan: * Acute respiratory failure with hypoxia ([place])- (present on admission) -In the setting of aspiration event and aspiration pneumonia -Patient aspirated on his Protonix pill and became extremely short of breath going on for a CT scan for his last urology evaluation. -Initiated on empiric antibiotics with IV ceftriaxone and azithromycin which we will continue but this was changed to IV Unasyn given his worsening respiratory status -Continue treatment as below delineated in sepsis and aspiration pneumonia -SLP evaluated and recommending n.p.o. except ice chips; Re-evaluation pending  -Given that he is n.p.o. we will place a small bore Cortrak for medication administration and tube feedings -Continue with speech therapy -SpO2: 100 % O2 Flow Rate (L/min): 4 L/min; Was almost weaned off of O2 yesterday but now back on it  -Continuous pulse oximetry and maintain O2 saturations greater than 90% -Continue supplemental oxygen via nasal cannula wean O2 as tolerated -Patient's voice has become more whisper so a CT scan of the neck soft tissue was done and showed "Submucosal edema in the supraglottic larynx. -ENT consulted about CT Soft Tissue Neck Findings and Dr. Janace Hoard will come assess the patient and he had recommended scope and the patient the patient refused at this time and will continue monitor patient's respiratory status carefully and then consider scoping in a few days but now recommending outpatient follow up.  -ENT feels that he would benefit from a vocal cord augmentation once he recovers -Repeat CXR in the AM    Malnutrition of moderate degree Nutrition Status: Nutrition Problem: Moderate Malnutrition (in the context of social/environmental circumstances) Etiology: dysphagia Signs/Symptoms: mild muscle depletion, mild fat depletion, percent weight loss (10.3% x 6 months) Percent weight  loss: 10.3 % Interventions: Refer to RD note for recommendations -Nutritionist Consulted and appreciate Recc's     OSA (obstructive sleep apnea) -C/w CPAP qHS    Obesity (BMI 0000000) -Complicates overall prognosis and care -Estimated body mass index is 31.38 kg/m as calculated from the following:   Height as of this encounter: 5\' 8"  (1.727 m).   Weight as of this encounter: 93.6 kg.  -Weight Loss and Dietary Counseling given  Stage 3a chronic kidney disease (CKD) (HCC) -Patient's BUN/creatinine is currently at baseline slightly worsened -BUN/creatinine went from 23/1.40 -> 17/1.58 ->  12/1.36 -> 12/1.27 -> 12/1.20 -> 16/1.15 -> 19/1.26 -Has a Right Nephrostomy tube -Avoid further nephrotoxic medications, consciousness, hypotension renally dose medications -Repeat CMP in the a.m.  Nephrolithiasis -Plan was to have a nephrectomy later this week -Patient sees Dr. Tresa Moore of Urology as an outpatient -CT Renal Stone study done as an outpatient which we need to get Results  -Notified Dr. Tresa Moore that patient is currently hospitalized with Aspiration as a courtesy; Dr. Tresa Moore has seen the patient and is planning on rescheduling outpatient appointment   Hx of subdural hematoma -Was presently obtaining CT in anticipation for a right-sided nephrectomy  later this week however he aspirated un the CT  Scanner -Given concern for worsening weakness compared to his baseline we have ordered a MRI which showed no acute CVA -PT/OT to evaluate and Treat and recommending Home Health PT once ready to be D/C'd home  Sore throat -CT Neck as Above  -CT of the neck done and showed "Submucosal edema in the supraglottic larynx." -ENT to Evaluate and appreciate Dr. Jearld Fenton evaluating.  Dr. Jearld Fenton had recommended a scope but patient has initially refused.  They recommend continuing to follow to see if the patient proves for scoping.  His hoarseness is starting to improve a little bit and he continues to have no  stridor or airway noise. -ENT recommends continuing n.p.o. for now and the patient is doing a little bit better and feel that his voice is respiratory with no breathing issues bilateral.  They feel that his vocal cord weakness that may be why he is aspirating more and they feel that a vocal cord augmentation may be helpful after he recovers more.  They are recommending outpatient evaluation  Severe sepsis (HCC) -Patient aspirated yesterday and presented with fever, tachycardia, tachypnea and a source of infection with aspiration findings -Continuing with antibiotics IV Unasyn; today is day 6 of 7 of the Unasyn -SLP done and recommending n.p.o. for now -Blood cultures not obtained on admission for unclear reasons so we will obtain now -Respiratory panel done by PCR showed negative influenza A and negative influenza B as well as SARS-CoV-2 testing negative by PCR -CTA of the chest PE protocol done and showed "Negative for acute pulmonary embolus. Interval finding of mildly nodular and ground-glass airspace disease in the right lung base with debris and or fluid within the bronchi, given clear lung bases on the CT performed earlier today, suspect that findings could be secondary to aspiration." -Last chest x-ray done and showed "Likely trace right pleural effusion.Interval increase in right base patchy airspace opacity. Finding could represent a combination of infection/inflammation versus atelectasis". -Will continue Xopenex and Atrovent scheduled as well as flutter valve, incentive spirometer and guaifenesin via the Cortrak -Budesonide 0.25 mg nebs twice daily and continue monitor his respiratory status carefully -IVF now stopped  -Given his generalized weakness and worsening swallow function MRI was done to evaluate for CVA and this was negative -We will do sepsis complete work-up and obtain urinalysis however this may be invalid given that he is on antibiotics already -Continue to follow cultures;  blood cultures x2 showed no growth to date at 2 Days -WBC went from 16.1 -> 14.2 -> 11.2 -> 8.9 -> 8.7 -> 6.8 and today it is 7.7 -NG Cortrak is being placed in the inability to swallow; will continue n.p.o. for now and will re-evaluate Swallowing per SLP reccs; SLP reevaluation still pending -Repeat chest x-ray today showed "Patchy infiltrates are seen in the right lower lung fields suggesting atelectasis/pneumonia with interval improvement."    Aspiration pneumonia (HCC)- (present on admission) -Patient aspirated PTA and presented with fever, tachycardia, tachypnea and a source of infection with aspiration findings -Can continue empiric antibiotics with IV ceftriaxone and azithromycin and will obtain sputum cultures and this was changed to Unasyn -SLP done and recommending n.p.o. for now -Blood cultures not obtained on admission for unclear reasons so we will obtain now -Respiratory panel done by PCR showed negative influenza A and negative influenza B as well as SARS-CoV-2 testing negative by PCR -CTA of the chest PE protocol done and showed "Negative for acute pulmonary embolus. Interval finding of mildly nodular and ground-glass airspace  disease in the right lung base with debris and or fluid within the bronchi, given clear lung bases on the CT performed earlier today, suspect that findings could be secondary to aspiration." -Repeat CXR done and showed "Likely trace right pleural effusion. Interval increase in right base patchy airspace opacity. Finding could represent a combination of infection/inflammation versus atelectasis." -Given his generalized weakness and worsening swallow function MRI was done to evaluate for CVA and this was negative -Started patient on Xopenex and Atrovent as well as Pulmicort and will continue.  Also placed on Guaifenesin via tube as well as flutter valve incentive spirometry -We will do sepsis complete work-up and obtain urinalysis however this may be invalid given  that he is on antibiotics already -Continue to follow cultures;  -WBC went from 16.1 -> 14.2 -> 11.2 -> 8.9 -> 8.7 -> 6.8 -> 7.7 -NG Cortrak is being placed in the inability to swallow -If not improving will consult Pulmonary and repeat chest x-ray in a.m. -Repeat CXR and showed "Transverse diameter of heart is increased. There are patchy infiltrates in right lower lung fields with possible slight improvement. No new infiltrates are seen. There are no signs of alveolar pulmonary edema. Enteric tube is noted traversing the esophagus." -Continue to Monitor Respiratory Status carefully  H/O ischemic right PCA stroke -See Hx of Subdural Hematoma  Hypokalemia- (present on admission) -Patient's K+ went from 3.3 -> 2.9 -> 3.4 -> 3.0 -> 3.6 -> 3.4 -> 4.1 -Mag Level is now 2.3 -Continue to Monitor and Replete as Necessary -Repeat CMP in the AM   Hypertension- (present on admission) -Continue n.p.o. and will initiate amlodipine through small bore feeding tube -Continue to monitor blood pressures per protocol -Last BP was 154/108  CKD (chronic kidney disease) stage 3a- (present on admission) -Patient's BUNs/creatinine is relatively stable last few days and is now 12/1.36 -> 12/1.27 -> 12/1.20 -> 16/1.15 -> 19/1.26 -IVF now stopped  -Avoid further nephrotoxic medications, contrast dyes, hypotension renally dose medications -Repeat CMP in a.m.   Nutrition Documentation    [facility]-Ad[facility] (Current) from 08/15/2021 in [facility]  Nutrition Problem Moderate Malnutrition  [in the context of social/environmental circumstances]  Etiology dysphagia  Nutrition Goal Patient will meet greater than or equal to 90% of their needs  Interventions Refer to RD note for recommendations      Subjective: Seen and examined at bedside and he is waiting for the speech therapist reevaluation.  Thinks he is doing okay.  Still coughing up some sputum.  Chest x-ray looks little  bit better.  No nausea or vomiting.  PT OT still recommending home health.  No other concerns or complaints at this time.  Physical Exam: Vitals:   08/21/21 0458 08/21/21 0754 08/21/21 1203 08/21/21 1317  BP:  (!) 164/106 (!) 154/108   Pulse:  65 78 72  Resp:  15 16 14   Temp:  98 F (36.7 C)    TempSrc:  Oral Oral   SpO2:  95% 96% 98%  Weight: 93 kg     Height:       Examination: Physical Exam:  Constitutional: WN/WD obese Caucasian male currently no acute distress appears calm Respiratory: Diminished to auscultation bilaterally with some coarse breath sounds with some rhonchi and some slight crackles.  Worse on the right compared to left.  Wearing supplemental oxygen via nasal cannula Cardiovascular: RRR, no murmurs / rubs / gallops. S1 and S2 auscultated.  Trace lower extremity edema Abdomen:  Soft, non-tender, distended secondary body habitus Bowel sounds positive.  GU: Deferred.  Has a right PCN in place and has a condom cath in place as well Musculoskeletal: No clubbing / cyanosis of digits/nails. No joint deformity upper and lower extremities. Skin: No rashes, lesions, ulcers on limited skin evaluation. No induration; Warm and dry.   Data Reviewed:  Independently reviewed and assessed the patient's clinical laboratory data and his BUNs/creatinine slightly elevated from yesterday is now 19/1.26.  CBC is within normal limits and CBGs ranging from 107-160  Family Communication: No family currently at bedside  Disposition: Status is: Inpatient Remains inpatient appropriate because: We will need safe discharge disposition and ability to improved swallow function or get a PEG tube if he continues to be n.p.o.  Planned Discharge Destination: Home with Home Health  DVT prophylaxis: Enoxaparin 40 mg sq q24h  Author: Raiford Noble, DO  [facility]  08/21/2021 3:32 PM  For on call review [email or web address].

## 2021-08-21 NOTE — Progress Notes (Signed)
PT Cancellation Note  Patient Details Name: Nicholas Mckenzie MRN: [id number] DOB: [birth year]   Cancelled Treatment:    Reason Eval/Treat Not Completed: Patient declined, no reason specified (pt resting with cpap on. pt able to respond but denied readiness for mobility at this time. Will reattempt)   Analycia Khokhar B [employer] Pizzini 08/21/2021, 7:23 AM Merryl Hacker, PT Acute Rehabilitation Services Pager: [phone] Office: [phone]

## 2021-08-21 NOTE — Progress Notes (Signed)
Occupational Therapy Treatment Patient Details Name: Nicholas Mckenzie MRN: [id number] DOB: 02-25-1946 Today's Date: 08/21/2021   History of present illness 76 y.o. male admitted 2/21 with SOB and tachycardia with possibly choking on pill. Pt with acute respiratory failure. PMhx. 06/06/2021 fall with SDH, CKD stage IIIa, nephrostomy tube, sleep apnea, HTN, OSA, depression/anxiety, CVA with residual left hemiparesis.   OT comments  Pt up in chair, requesting to stand and get off his bottom. Ambulated in room and to sink for grooming with RW and min guard assist. Pt able to release walker in static standing during grooming. Pt on 3L 02 throughout session with Sp02 in mid 90s. Pt is eager to be able to eat.    Recommendations for follow up therapy are one component of a multi-disciplinary discharge planning process, led by the attending physician.  Recommendations may be updated based on patient status, additional functional criteria and insurance authorization.    Follow Up Recommendations  Home health OT    Assistance Recommended at Discharge Frequent or constant Supervision/Assistance  Patient can return home with the following  A little help with walking and/or transfers;A little help with bathing/dressing/bathroom;Assistance with cooking/housework;Direct supervision/assist for medications management;Direct supervision/assist for financial management;Assist for transportation;Help with stairs or ramp for entrance   Equipment Recommendations  None recommended by OT    Recommendations for Other Services      Precautions / Restrictions Precautions Precautions: Fall Precaution Comments: cortrak, nephrostomy tube       Mobility Bed Mobility               General bed mobility comments: pt in chair    Transfers Overall transfer level: Needs assistance Equipment used: Rolling walker (2 wheels) Transfers: Sit to/from Stand Sit to Stand: Min guard           General transfer  comment: from recliner, cues for hand placement     Balance Overall balance assessment: Needs assistance   Sitting balance-Leahy Scale: Fair     Standing balance support: Bilateral upper extremity supported, Single extremity supported, No upper extremity supported Standing balance-Leahy Scale: Fair Standing balance comment: can release walker in static standing                           ADL either performed or assessed with clinical judgement   ADL Overall ADL's : Needs assistance/impaired Eating/Feeding: NPO   Grooming: Oral care;Wash/dry hands;Wash/dry face;Min guard;Standing           Upper Body Dressing : Minimal assistance;Sitting Upper Body Dressing Details (indicate cue type and reason): to doff front opening gown                        Extremity/Trunk Assessment              Vision       Perception     Praxis      Cognition Arousal/Alertness: Awake/alert Behavior During Therapy: WFL for tasks assessed/performed Overall Cognitive Status: No family/caregiver present to determine baseline cognitive functioning Area of Impairment: Memory                     Memory: Decreased short-term memory                  Exercises      Shoulder Instructions       General Comments      Pertinent Vitals/ Pain  Pain Assessment Pain Assessment: No/denies pain  Home Living                                          Prior Functioning/Environment              Frequency  Min 2X/week        Progress Toward Goals  OT Goals(current goals can now be found in the care plan section)  Progress towards OT goals: Progressing toward goals  Acute Rehab OT Goals OT Goal Formulation: With patient Time For Goal Achievement: 08/31/21 Potential to Achieve Goals: Good  Plan Discharge plan remains appropriate    Co-evaluation                 AM-PAC OT "6 Clicks" Daily Activity     Outcome  Measure   Help from another person eating meals?: A Little Help from another person taking care of personal grooming?: A Little Help from another person toileting, which includes using toliet, bedpan, or urinal?: A Little Help from another person bathing (including washing, rinsing, drying)?: A Little Help from another person to put on and taking off regular upper body clothing?: A Little Help from another person to put on and taking off regular lower body clothing?: A Little 6 Click Score: 18    End of Session Equipment Utilized During Treatment: Gait belt;Rolling walker (2 wheels)  OT Visit Diagnosis: Unsteadiness on feet (R26.81);Muscle weakness (generalized) (M62.81);Other symptoms and signs involving cognitive function;Hemiplegia and hemiparesis Hemiplegia - Right/Left: Left Hemiplegia - dominant/non-dominant: Non-Dominant Hemiplegia - caused by: Other cerebrovascular disease   Activity Tolerance Patient tolerated treatment well   Patient Left in chair;with call bell/phone within reach;with chair alarm set   Nurse Communication          Time: [phone] OT Time Calculation (min): 14 min  Charges: OT General Charges $OT Visit: 1 Visit OT Treatments $Self Care/Home Management : 8-22 mins  Nicholas Mckenzie, OTR/L Acute Rehabilitation Services Pager: [phone] Office: [phone]   [place], [place] 08/21/2021, 3:47 PM

## 2021-08-21 NOTE — Care Management Important Message (Signed)
Important Message  Patient Details  Name: Bronc Buckbee MRN: [id number] Date of Birth: 25-Feb-1946   Medicare Important Message Given:  Yes     Shelda Altes 08/21/2021, 10:51 AM

## 2021-08-21 NOTE — Progress Notes (Signed)
Speech Language Pathology Treatment: Dysphagia  Patient Details Name: Nicholas Mckenzie MRN: [id number] DOB: 08-30-1945 Today's Date: 08/21/2021 Time: 6073-7106 SLP Time Calculation (min) (ACUTE ONLY): 17 min  Assessment / Plan / Recommendation Clinical Impression  Pt's voice is still quite dysphonic today, and he believes that it has not yet had any improvement. He says that he has still been using theyankauer to remove his secretions, but that he thinks he is doing this more habitually now and not attempting to swallow. SLP offered ice chips without overt s/s of aspiration. Pt subjectively felt like they were "scratchy" but that they went down well. He had similar presentation with spoonfuls of water but with cup sips, he did start to have some coughing. Given that pt is starting to feel some improvements, but has persistent signs of dysphagia, would proceed with repeat MBS to better evaluate any potential change in his oropharyngeal function. This can be done on next date at the earliest, so would maintain NPO status until then.    HPI HPI: Pt is a 76 yo male adm to [facility] after concern for aspiration event with pill.  Pt CT chest showed "Interval finding of mildly nodular and ground-glass airspace  disease in the right lung base with debris and or fluid within the  bronchi, given clear lung bases on the CT performed earlier today,  suspect that findings could be secondary to aspiration."  Swallow eval ordered. 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  Pt reports he tried to swallow pill dry and this resulted in aspiration.  Per CT neck, pt has Submucosal edema in the supraglottic larynx.      SLP Plan  MBS      Recommendations for follow up therapy are one component  of a multi-disciplinary discharge planning process, led by the attending physician.  Recommendations may be updated based on patient status, additional functional criteria and insurance authorization.    Recommendations  Diet recommendations: NPO;Other(comment) (small single ice chips, 1 tsp water, pt able to swish and expectorate with water) Liquids provided via: Teaspoon Medication Administration: Via alternative means                Oral Care Recommendations: Oral care QID Follow Up Recommendations: Home health SLP Assistance recommended at discharge: Frequent or constant Supervision/Assistance SLP Visit Diagnosis: Dysphagia, pharyngeal phase (R13.13);Dysphagia, pharyngoesophageal phase (R13.14) Plan: MBS           Mahala Menghini., M.A. CCC-SLP Acute Rehabilitation Services Pager [phone] Office [phone]  08/21/2021, 4:39 PM

## 2021-08-22 ENCOUNTER — Inpatient Hospital Stay (HOSPITAL_COMMUNITY): Payer: Medicare Other

## 2021-08-22 HISTORY — PX: IR NEPHROSTOMY EXCHANGE RIGHT: IMG6070

## 2021-08-22 LAB — SEDIMENTATION RATE: Sed Rate: 71 mm/hr — ABNORMAL HIGH (ref 0–16)

## 2021-08-22 LAB — COMPREHENSIVE METABOLIC PANEL
ALT: 20 U/L (ref 0–44)
AST: 17 U/L (ref 15–41)
Albumin: 3.1 g/dL — ABNORMAL LOW (ref 3.5–5.0)
Alkaline Phosphatase: 45 U/L (ref 38–126)
Anion gap: 9 (ref 5–15)
BUN: 22 mg/dL (ref 8–23)
CO2: 27 mmol/L (ref 22–32)
Calcium: 9.6 mg/dL (ref 8.9–10.3)
Chloride: 109 mmol/L (ref 98–111)
Creatinine, Ser: 1.33 mg/dL — ABNORMAL HIGH (ref 0.61–1.24)
GFR, Estimated: 56 mL/min — ABNORMAL LOW (ref >60)
Glucose, Bld: 144 mg/dL — ABNORMAL HIGH (ref 70–99)
Potassium: 3.7 mmol/L (ref 3.5–5.1)
Sodium: 145 mmol/L (ref 135–145)
Total Bilirubin: 0.4 mg/dL (ref 0.3–1.2)
Total Protein: 6.7 g/dL (ref 6.5–8.1)

## 2021-08-22 LAB — GLUCOSE, CAPILLARY
Glucose-Capillary: 104 mg/dL — ABNORMAL HIGH (ref 70–99)
Glucose-Capillary: 129 mg/dL — ABNORMAL HIGH (ref 70–99)
Glucose-Capillary: 131 mg/dL — ABNORMAL HIGH (ref 70–99)
Glucose-Capillary: 138 mg/dL — ABNORMAL HIGH (ref 70–99)
Glucose-Capillary: 149 mg/dL — ABNORMAL HIGH (ref 70–99)

## 2021-08-22 LAB — PHOSPHORUS: Phosphorus: 4 mg/dL (ref 2.5–4.6)

## 2021-08-22 IMAGING — DX DG CHEST 1V PORT
1 series · 1 of 1 positions shown · non-contrast
Comparison: Chest x-ray dated [DATE] obtained at [DATE]

CLINICAL DATA: Shortness of breath

EXAM:
PORTABLE CHEST 1 VIEW

[chest ap]
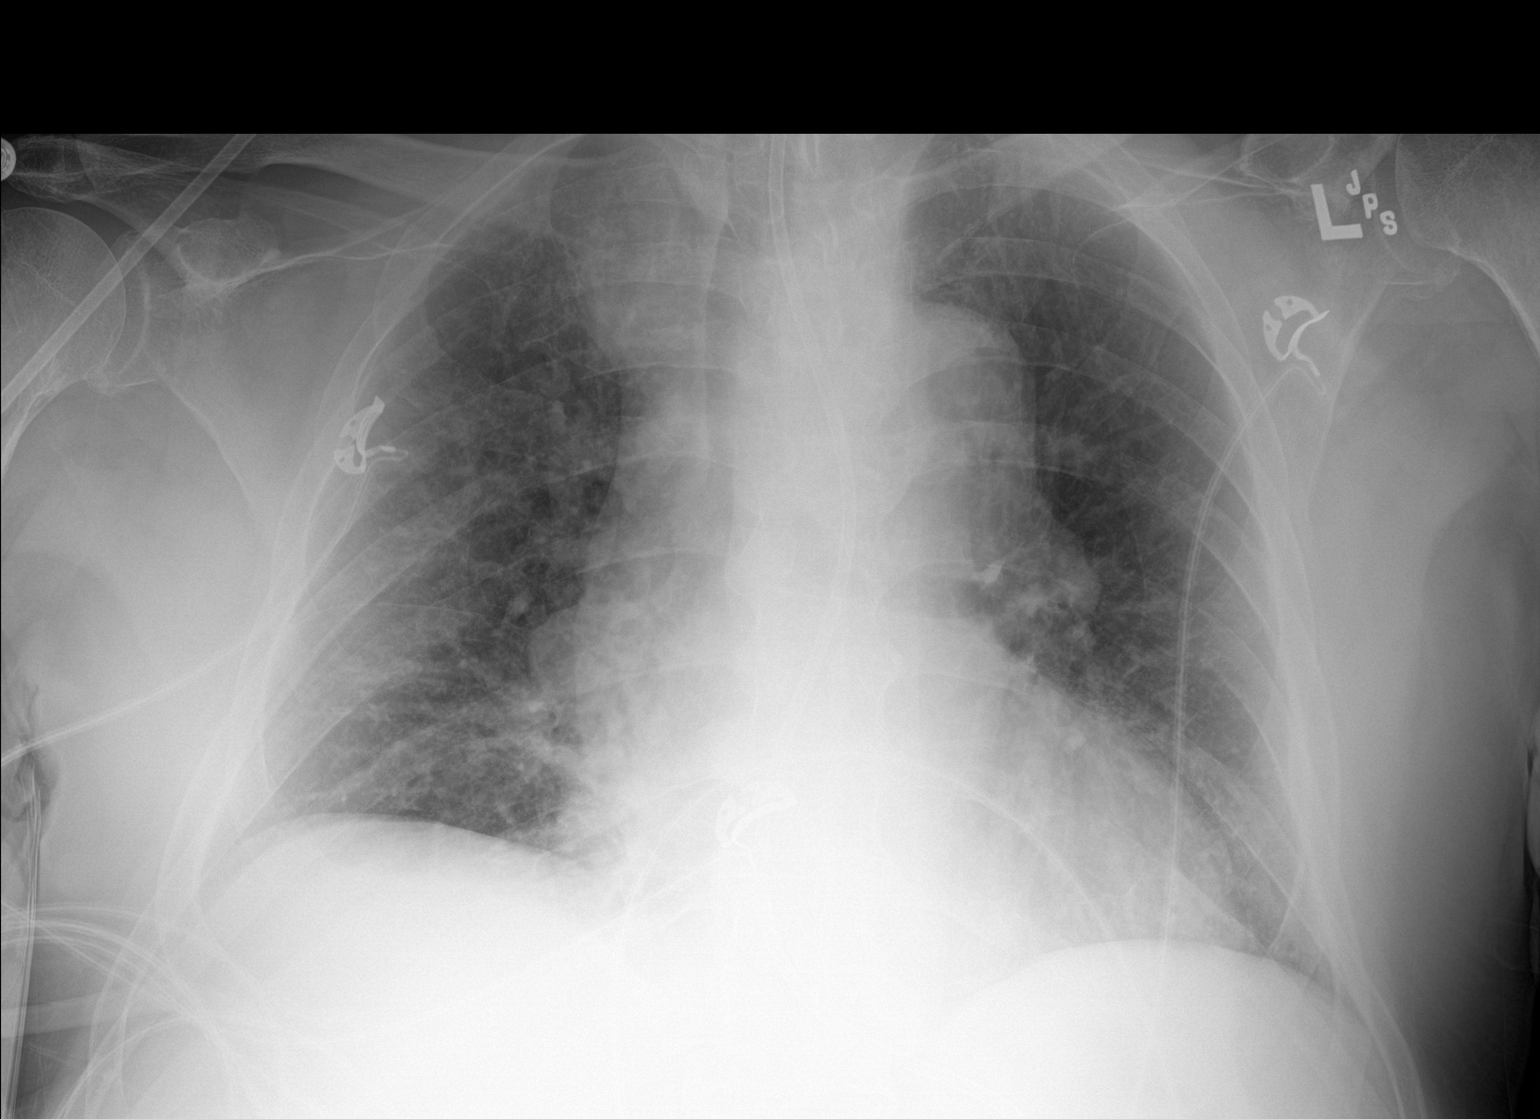

[1 of 1 positions shown; findings below may reference images not displayed]

FINDINGS: Cardiac and mediastinal contours are unchanged. Improved aeration of
the lung bases, likely due to decreased atelectasis. No large
pleural effusion or evidence of pneumothorax. Enteric feeding tube
partially seen coursing below the diaphragm.
IMPRESSION: Compared aeration of the lung bases, likely due to decreased
atelectasis.

## 2021-08-22 IMAGING — XA IR EXCHANGE NEPHROSTOMY RIGHT
2 series · 4 of 4 positions shown · IV contrast (IODINE)
Comparison: None.

INDICATION: 75-year-old gentleman with chronic indwelling right nephrostomy
drain returns with dislodged drain.

EXAM:
Fluoroscopy guided re-insertion of right percutaneous nephrostomy
drain.

[Series 2: cerebral · 1 of 1 slices shown]
[im 1/1]
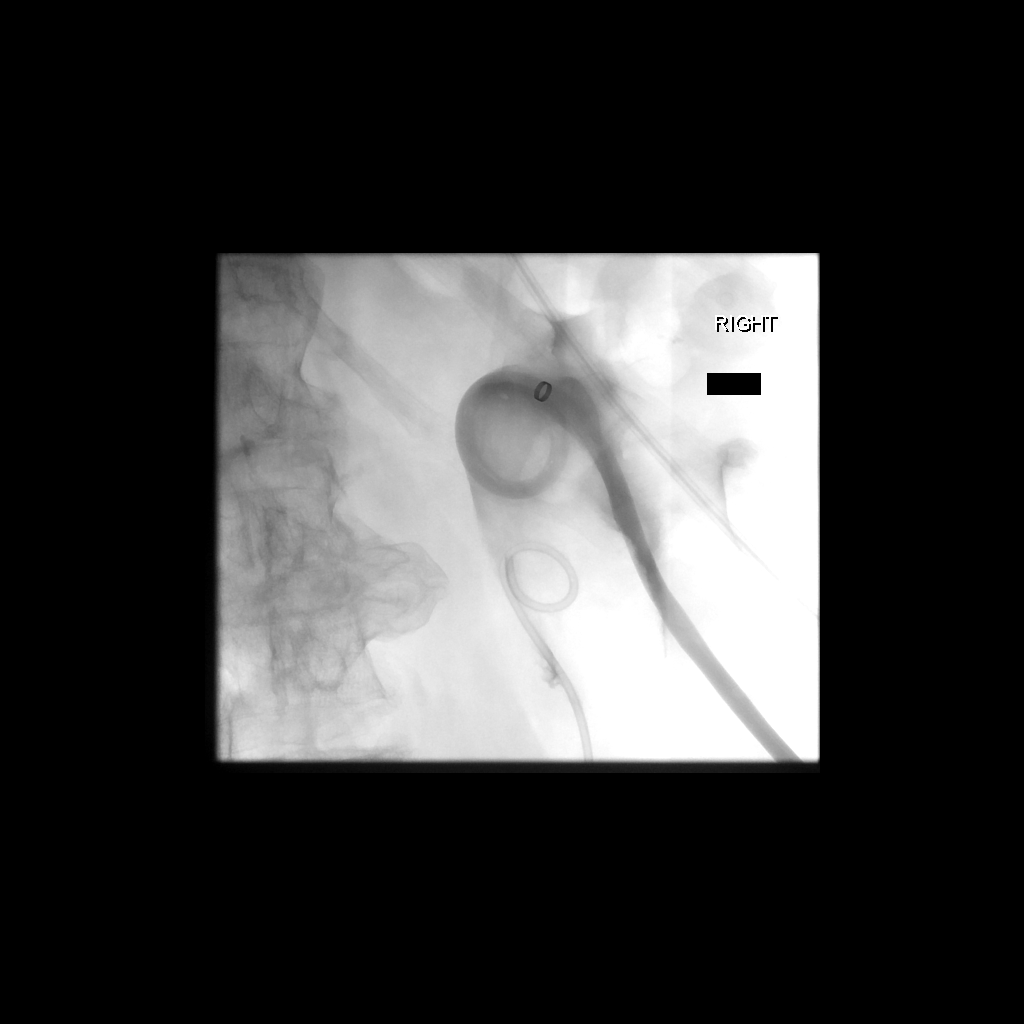

[Series 300: ir radiologist eval & mgmt · 3 of 3 slices shown]
[im 1/3]
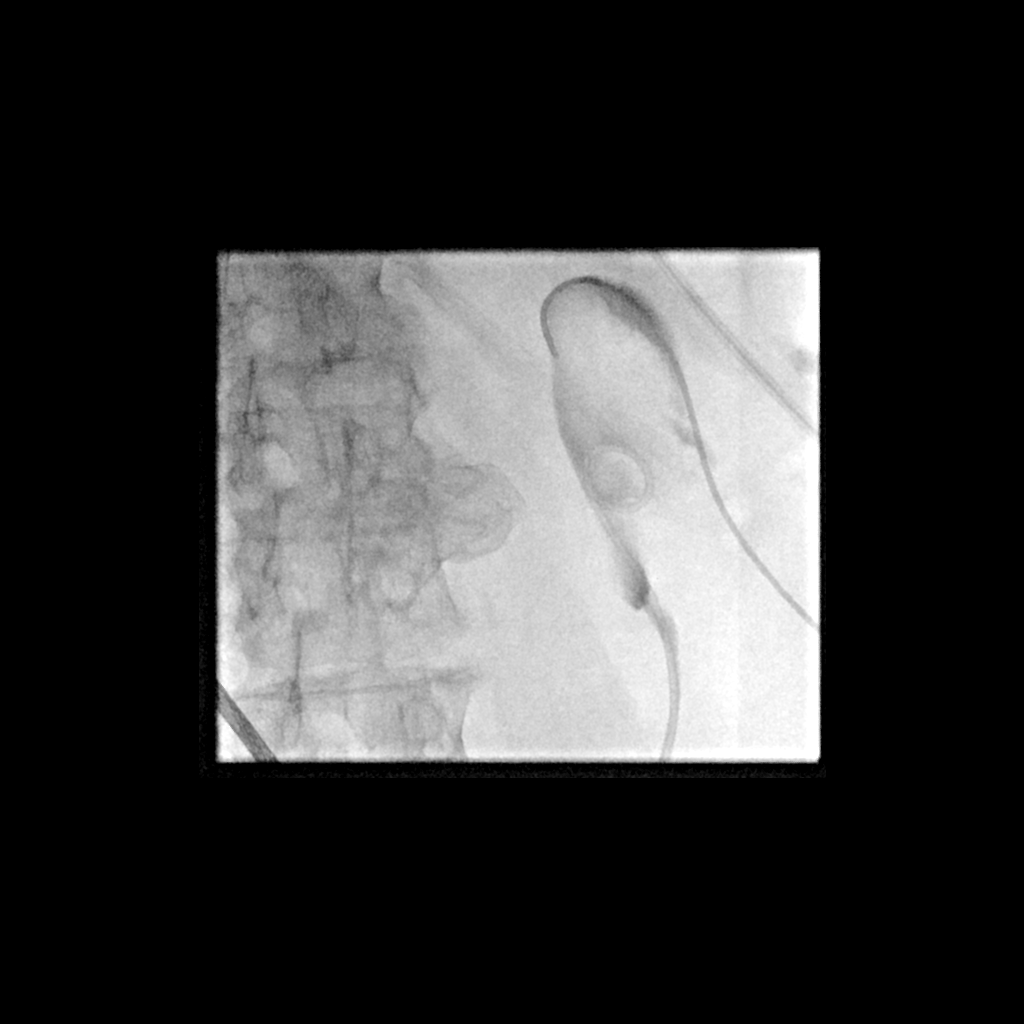
[im 2/3]
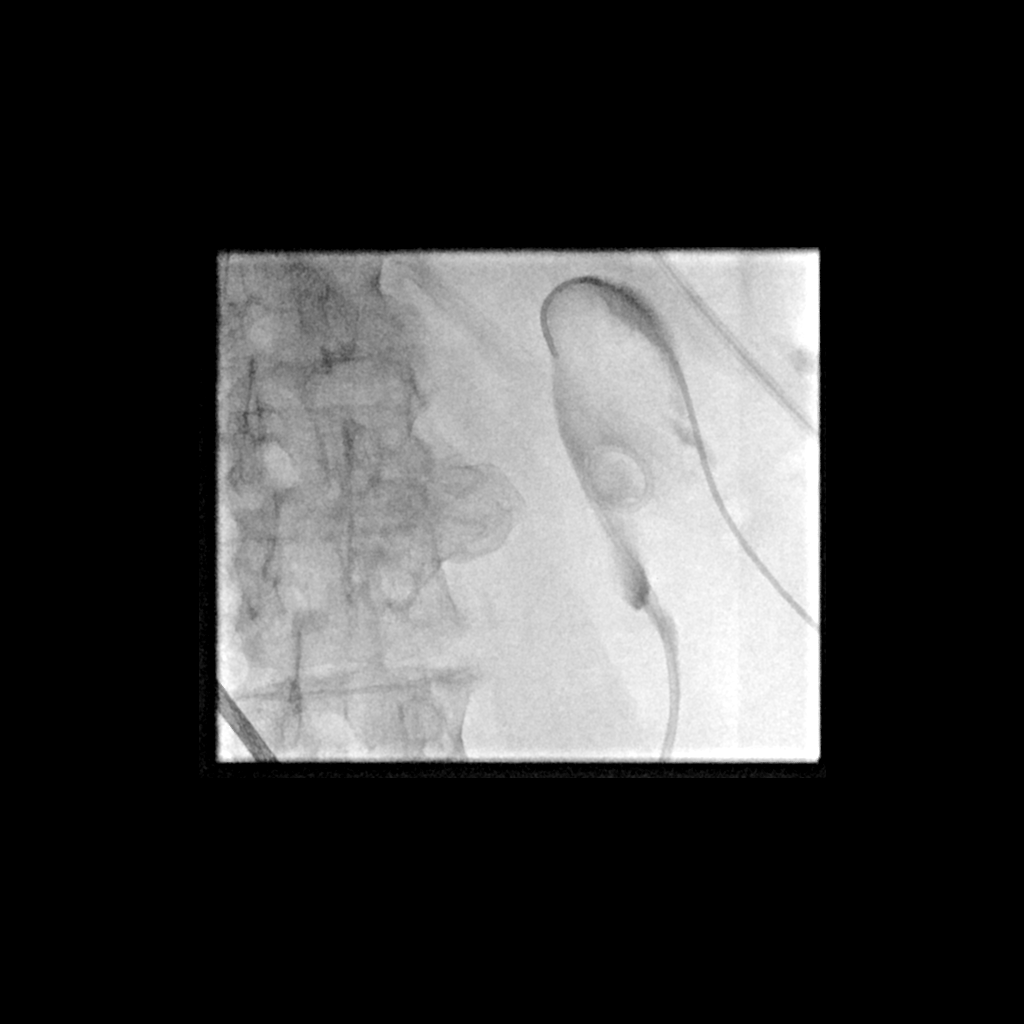
[im 3/3]
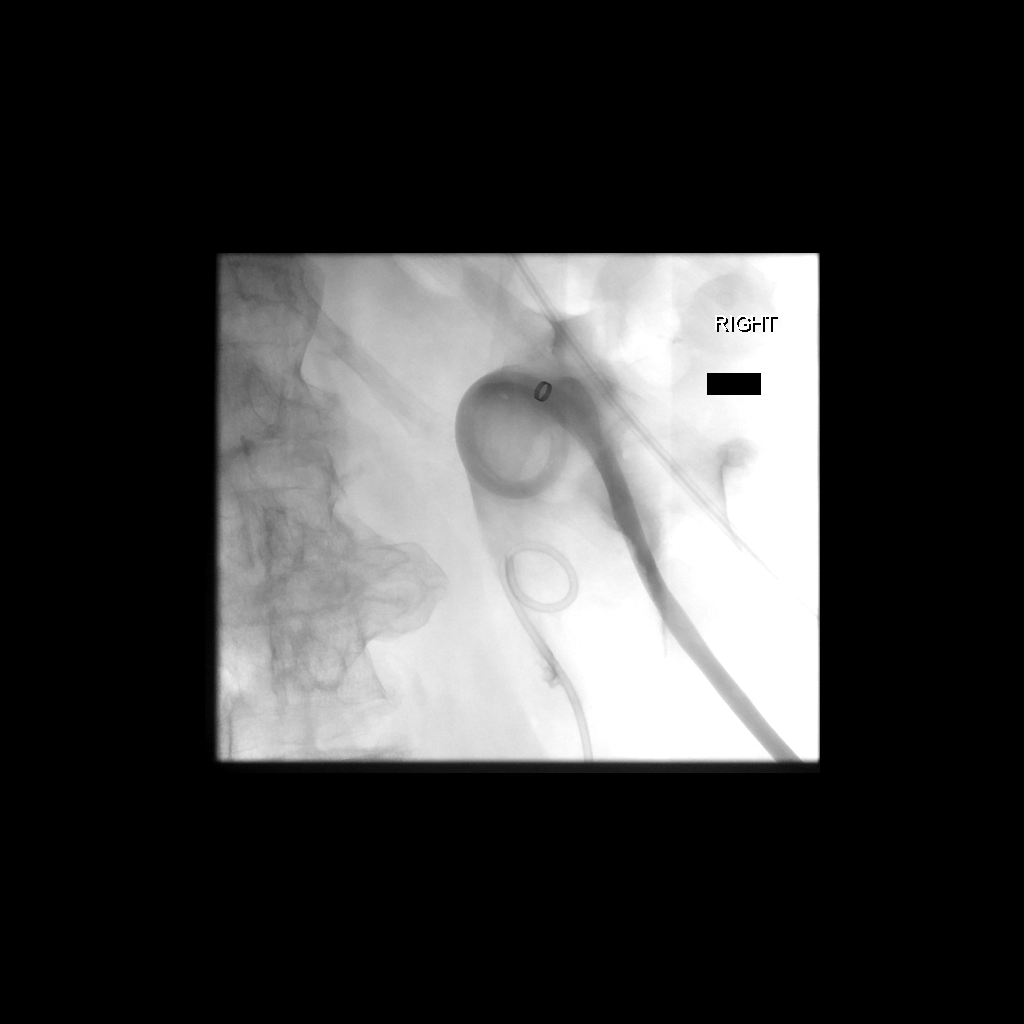

[4 of 4 positions shown; findings below may reference images not displayed]

MEDICATIONS:
None

ANESTHESIA/SEDATION:
None

CONTRAST:  5 mL of Omnipaque 300-administered into the collecting
system(s)

FLUOROSCOPY TIME:  Fluoroscopy Time: 0.9 minutes (28 mGy).

COMPLICATIONS:
None immediate.

PROCEDURE:
Informed written consent was obtained from the patient after a
thorough discussion of the procedural risks, benefits and
alternatives. All questions were addressed. Maximal Sterile Barrier
Technique was utilized including caps, mask, sterile gowns, sterile
gloves, sterile drape, hand hygiene and skin antiseptic. A timeout
was performed prior to the initiation of the procedure.

The existing right nephrostomy tract was access with a Kumpe
catheter. Kumpe catheter advanced into the right renal collecting
system. Contrast administered through the Kumpe catheter under
fluoroscopy confirmed appropriate positioning. Kumpe catheter
exchanged for 14 French multipurpose pigtail drain over 0.035 inch
guidewire. Final position confirmed by administering contrast
through the drain.

Drain secured to skin with suture and connected to bag.
IMPRESSION: Successful reinsertion of 14 French right nephrostomy drain.

PLAN:
Return in 8 weeks for routine exchange.

## 2021-08-22 MED ORDER — LIDOCAINE HCL 1 % IJ SOLN
INTRAMUSCULAR | Status: AC
  Filled 2021-08-22: qty 20

## 2021-08-22 MED ORDER — IOHEXOL 300 MG/ML  SOLN
100.0000 mL | Freq: Once | INTRAMUSCULAR | Status: AC | PRN
  Administered 2021-08-22: 10 mL

## 2021-08-22 NOTE — Progress Notes (Signed)
Speech Language Pathology Treatment: Dysphagia  Patient Details Name: Nicholas Mckenzie MRN: [id number] DOB: Dec 02, 1945 Today's Date: 08/22/2021 Time: 1025-1040 SLP Time Calculation (min) (ACUTE ONLY): 15 min  Assessment / Plan / Recommendation Clinical Impression  Skilled SLP conducted to inform pt of plan for MBS next date with Cortrak removal to provide optimal performance.  Pt again confirms issues with coughing up secretions starting of the last 3 months - gradual onset and worsening.  He did report plan to have PEG tube placed if he does not do well with his MBS.  Educated pt that PEG tubes provide nutrition but do not prevent aspiration - thus oral care is indicated.  Reviewed with pt that intake of ice/water would still be warranted to maintain current level of swallow.  Also would question if pt would benefit from ENT as an OP to assess laryngeal function- potential intervention to improve swallowing (and voice).  Pt agreeable with plan but he does endorse concerns for blood glucose on TF.  Reviewed blood glucose number with him since Feb 21 to yesterday - varying from approx 107 to max 143.  He reports he does not take his blood glucose at home PTA as he is not diabetic.  Advised he speak to MD and dietician re: these concerns.  Encourage pt to swallow his saliva frequently after expectorating secretions and continue use of his flutter valve using teach back for clinical reasoning.    HPI HPI: Pt is a 76 yo male adm to [facility] after concern for aspiration event with pill.  Pt CT chest showed "Interval finding of mildly nodular and ground-glass airspace  disease in the right lung base with debris and or fluid within the  bronchi, given clear lung bases on the CT performed earlier today,  suspect that findings could be secondary to aspiration."  Swallow eval ordered. 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  Pt reports he tried to swallow pill dry and this resulted in aspiration.  Per CT neck, pt has Submucosal edema in the supraglottic larynx.  Pt saw ENT during hospital coarse but declined to be scoped.  He has been nutritionally supported via Cortrak Tube feeding.      SLP Plan  MBS (08/23/2021)      Recommendations for follow up therapy are one component of a multi-disciplinary discharge planning process, led by the attending physician.  Recommendations may be updated based on patient status, additional functional criteria and insurance authorization.    Recommendations  Diet recommendations: NPO (for procedure, ice and tsps water after) Medication Administration: Via alternative means                Oral Care Recommendations: Oral care QID Follow Up Recommendations: Home health SLP Assistance recommended at discharge: Frequent or constant Supervision/Assistance SLP Visit Diagnosis: Dysphagia, pharyngeal phase (R13.13);Dysphagia, pharyngoesophageal phase (R13.14) Plan: MBS (08/23/2021)           Terance Ice, MS [facility] SLP Acute Rehab Services Office [phone] Pager [phone]   08/22/2021, 10:57 AM

## 2021-08-22 NOTE — Progress Notes (Signed)
Pt states he will wear CPAP tonight.

## 2021-08-22 NOTE — Evaluation (Signed)
SLP Cancellation Note  Patient Details Name: Nicholas Mckenzie MRN: [id number] DOB: [birth year]   Cancelled treatment:       Reason Eval/Treat Not Completed: Other (comment) (Will defer MBS today due to pt needing Cortrak removal to allow optimal opportunity to do well; will plan MBS tomorrow am with Cortrak removal with team approval)  Rolena Infante, MS [facility] SLP Acute Rehab Services Office [phone] Pager [phone]  Chales Abrahams 08/22/2021, 8:30 AM

## 2021-08-22 NOTE — Progress Notes (Signed)
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  He was n.p.o. prior to the scan but decided to take his Prozac pill before going to scan did not use water and he felt like he choked on the pill.  He started having some mild trouble breathing and elevated blood pressure but decided to proceed with a CT scan anyway and then went home and then took his Norvasc and drink some juice.  He started having more trouble throughout the day with his breathing and I brought him to the ED and he was noted to be hypertensive, tachycardic and hypoxic.  Subsequently the patient had likely aspirated his pills and had to be placed on supplemental oxygen via nasal cannula and improved.  He had to be placed on 13 L of supplemental oxygen and when he came to the ED, he underwent a CT scan of the chest which showed aspiration findings.  He was admitted to the stepdown progressive unit and admitted for his hypoxia after aspiration.  Patient became extremely short of breath and began having a persistent cough and patient's wife has noticed that his voice is become more of a whisper.  Patient also complained of sore throat.  In the ED he was hypoxic requiring at least 13 L of supplemental oxygen which was then weaned to 8 L.  CTA of the chest was negative for PE but did show findings concerning for  aspiration.  His labs did show leukocytosis and patient was febrile with a temperature of 102 and a COVID test was negative.  He was initially on empiric antibiotics and admitted for further work-up and admitted for sepsis secondary to aspiration pneumonia.  **Interim History ENT was consulted for the edema of the supraglottis and they recommended initially evaluating with a scope but patient did not want to do this so they are holding off for now.  Given slightly worsening pulmonary status overnight ENT and no new recommendations recommended continue n.p.o. for now.  Overnight the patient appeared more short of breath and had a change in consistency and inquiry of his sputum.  He is no longer bringing up large amounts of thick copious green sputum and his chest x-ray revealed substantial increase in his right basilar opacity compared to his prior chest x-ray.  His antibiotics were changed to Unasyn and his tube feedings were discontinued given that there is concern for ongoing aspiration due to malfunctioning tube feeding leading to worsening aspiration.  His tube feeds were resumed today after his Cortrak was unclogged.  He continues to be intermittently on and off oxygen But his respiratory status is relatively stable  He has been intermittently somnolent he day before he was more somnolent as he did not have a good night's rest. 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 SLP continues to recommend NPO.   Urology was consulted and patient had Neph tube 06/07/21. His Right Neph Tube was accidentally pulled overnight so we will consult IR for replacement.  Will continue to monitor renal function as it is slowly trending upwards and is now 22/1.33  Continue to monitor his respiratory status carefully and repeat chest x-ray this morning shows some interval improvement.  Will obtain  repeat SLP evaluation to assess his swallowing and the plan is to pull a core track in the morning and do an MBS and then replace if necessary.  Assessment and Plan: * Acute respiratory failure with hypoxia (HCC)- (present on admission) -In the setting of aspiration event and aspiration pneumonia -Patient aspirated on his Protonix pill and became extremely short of breath going on for a CT scan for his last urology evaluation. -Initiated on empiric antibiotics with IV ceftriaxone and azithromycin which we will continue but this was changed to IV Unasyn given his worsening respiratory status -Continue treatment as below delineated in sepsis and aspiration pneumonia -SLP evaluated and recommending n.p.o. except ice chips; Re-evaluation to be done after Cortrak is removed  -Given that he is n.p.o. we will place a small bore Cortrak for medication administration and tube feedings -Continue with speech therapy -SpO2: 96 % O2 Flow Rate (L/min): 4 L/min FiO2 (%): 36 %; Was almost weaned off of O2 yesterday but now back on it again  -Continuous pulse oximetry and maintain O2 saturations greater than 90% -Continue supplemental oxygen via nasal cannula wean O2 as tolerated -Patient's voice has become more whisper so a CT scan of the neck soft tissue was done and showed "Submucosal edema in the supraglottic larynx. -ENT consulted about CT Soft Tissue Neck Findings and Dr. Jearld Fenton will come assess the patient and he had recommended scope and the patient the patient refused at this time and will continue monitor patient's respiratory status carefully and then consider scoping in a few days but now recommending outpatient follow up.  -ENT feels that he would benefit from a vocal cord augmentation once he recovers -Repeat CXR intermittently     Malnutrition of moderate degree Nutrition Status: Nutrition Problem: Moderate Malnutrition (in the context of social/environmental circumstances) Etiology:  dysphagia Signs/Symptoms: mild muscle depletion, mild fat depletion, percent weight loss (10.3% x 6 months) Percent weight loss: 10.3 % Interventions: Refer to RD note for recommendations -Nutritionist Consulted and appreciate Recc's     OSA (obstructive sleep apnea) -C/w CPAP qHS    Obesity (BMI 30-39.9) -Complicates overall prognosis and care -Estimated body mass index is 31.38 kg/m as calculated from the following:   Height as of this encounter: 5\' 8"  (1.727 m).   Weight as of this encounter: 93.6 kg.  -Weight Loss and Dietary Counseling given  Stage 3a chronic kidney disease (CKD) (HCC) -Patient's BUN/creatinine is currently at baseline slightly worsened -BUN/creatinine went from 23/1.40 -> 17/1.58 ->  12/1.36 -> 12/1.27 -> 12/1.20 -> 16/1.15 -> 19/1.26 -> 22/1.33 -Has a Right Nephrostomy tube but has been accidentally dislodged so IR was consulted for further evaluation and Replacement -Avoid further nephrotoxic medications, consciousness, hypotension renally dose medications -Repeat CMP in the a.m.  Nephrolithiasis -Plan was to have a nephrectomy in the coming weeks -Patient sees Dr. Berneice Heinrich of Urology as an outpatient -CT Renal Stone study done as an outpatient which we need to get Results  -Notified Dr. Berneice Heinrich that patient is currently hospitalized with Aspiration as a courtesy; Dr. Berneice Heinrich has seen  the patient and is planning on rescheduling outpatient appointment   Hx of subdural hematoma -Was presently obtaining CT in anticipation for a right-sided nephrectomy  later this week however he aspirated un the CT Scanner -Given concern for worsening weakness compared to his baseline we have ordered a MRI which showed no acute CVA -PT/OT to evaluate and Treat and recommending Home Health PT once ready to be D/C'd home  Sore throat -CT Neck as Above  -CT of the neck done and showed "Submucosal edema in the supraglottic larynx." -ENT to Evaluate and appreciate Dr. Janace Hoard  evaluating.  Dr. Janace Hoard had recommended a scope but patient has initially refused.  They recommend continuing to follow to see if the patient proves for scoping.  His hoarseness is starting to improve a little bit and he continues to have no stridor or airway noise. -ENT recommends continuing n.p.o. for now and the patient is doing a little bit better and feel that his voice is respiratory with no breathing issues bilateral.  They feel that his vocal cord weakness that may be why he is aspirating more and they feel that a vocal cord augmentation may be helpful after he recovers more.  They are recommending outpatient evaluation  Severe sepsis ([place]) -Patient aspirated yesterday and presented with fever, tachycardia, tachypnea and a source of infection with aspiration findings -Continuing with antibiotics IV Unasyn; today is day 7 of 7 of the Unasyn so will stop after today -SLP done and recommending n.p.o. for now -Blood cultures not obtained on admission for unclear reasons so we will obtain now -Respiratory panel done by PCR showed negative influenza A and negative influenza B as well as SARS-CoV-2 testing negative by PCR -CTA of the chest PE protocol done and showed "Negative for acute pulmonary embolus. Interval finding of mildly nodular and ground-glass airspace disease in the right lung base with debris and or fluid within the bronchi, given clear lung bases on the CT performed earlier today, suspect that findings could be secondary to aspiration." -Last chest x-ray done and showed "Likely trace right pleural effusion.Interval increase in right base patchy airspace opacity. Finding could represent a combination of infection/inflammation versus atelectasis". -Will continue Xopenex and Atrovent scheduled as well as flutter valve, incentive spirometer and guaifenesin via the Cortrak -Budesonide 0.25 mg nebs twice daily and continue monitor his respiratory status carefully -IVF now stopped  -Given his  generalized weakness and worsening swallow function MRI was done to evaluate for CVA and this was negative -We will do sepsis complete work-up and obtain urinalysis however this may be invalid given that he is on antibiotics already -Continue to follow cultures; blood cultures x2 showed no growth to date at 5 Days -WBC went from 16.1 -> 14.2 -> 11.2 -> 8.9 -> 8.7 -> 6.8 and on last check it is 7.7 -NG Cortrak is being placed in the inability to swallow; will continue n.p.o. for now and will re-evaluate Swallowing per SLP reccs; SLP recommends removing Cortrak in the AM when Cortrak Team is here and doing an MBS in the AM and replacing Cortrak if necessary  -Repeat chest x-ray today showed "Cardiac and mediastinal contours are unchanged. Improved aeration of the lung bases, likely due to decreased atelectasis. No large pleural effusion or evidence of pneumothorax. Enteric feeding tube partially seen coursing below the diaphragm."    Aspiration pneumonia ([place])- (present on admission) -Patient aspirated PTA and presented with fever, tachycardia, tachypnea and a source of infection with aspiration findings -Can continue  empiric antibiotics with IV ceftriaxone and azithromycin and will obtain sputum cultures and this was changed to Unasyn -SLP done and recommending n.p.o. for now -Blood cultures not obtained on admission for unclear reasons so we will obtain now -Respiratory panel done by PCR showed negative influenza A and negative influenza B as well as SARS-CoV-2 testing negative by PCR -CTA of the chest PE protocol done and showed "Negative for acute pulmonary embolus. Interval finding of mildly nodular and ground-glass airspace disease in the right lung base with debris and or fluid within the bronchi, given clear lung bases on the CT performed earlier today, suspect that findings could be secondary to aspiration." -Repeat CXR done and showed "Likely trace right pleural effusion. Interval increase in  right base patchy airspace opacity. Finding could represent a combination of infection/inflammation versus atelectasis." -Given his generalized weakness and worsening swallow function MRI was done to evaluate for CVA and this was negative -Started patient on Xopenex and Atrovent as well as Pulmicort and will continue.  Also placed on Guaifenesin via tube as well as flutter valve incentive spirometry -We will do sepsis complete work-up and obtain urinalysis however this may be invalid given that he is on antibiotics already -Continue to follow cultures;  -WBC went from 16.1 -> 14.2 -> 11.2 -> 8.9 -> 8.7 -> 6.8 -> 7.7 on last check  -NG Cortrak is being placed in the inability to swallow and will discontinue in the AM and have MBS done  -If not improving will consult Pulmonary and repeat chest x-ray in a.m. -Repeat CXR showing some decreased atelectasis -Continue to Monitor Respiratory Status carefully  H/O ischemic right PCA stroke -See Hx of Subdural Hematoma  Hypokalemia- (present on admission) -Patient's K+ now is 3.7 -Mag Level is now 2.3 on last check  -Continue to Monitor and Replete as Necessary -Repeat CMP in the AM   Hypertension- (present on admission) -Continue n.p.o. and will initiate amlodipine through small bore feeding tube -Continue to monitor blood pressures per protocol -Last BP was 153/83  CKD (chronic kidney disease) stage 3a- (present on admission) -Patient's BUNs/creatinine is relatively stable last few days and is now 12/1.36 -> 12/1.27 -> 12/1.20 -> 16/1.15 -> 19/1.26 -> 22/1.33 -IVF now stopped  -Avoid further nephrotoxic medications, contrast dyes, hypotension renally dose medications -Repeat CMP in a.m.   Nutrition Documentation    [facility] (Current) from 08/15/2021 in [facility]  Nutrition Problem Moderate Malnutrition  [in the context of social/environmental circumstances]  Etiology dysphagia  Nutrition Goal  Patient will meet greater than or equal to 90% of their needs  Interventions Refer to RD note for recommendations      Subjective: Seen and examined at bedside and he was doing okay.  His right PCN was removed overnight accidentally so this will be replaced today.  Plan is for Cortrak to be removed in the morning and have a formal MBS done when the [facility] team is available in case it needs to be replaced.  He denies any concerns or complaints and feels okay and denies any chest pain.  Physical Exam: Vitals:   08/21/21 2259 08/22/21 0025 08/22/21 0307 08/22/21 0547  BP:  (!) 166/111 (!) 174/101 (!) 153/83  Pulse: 84 78 90 85  Resp: 12 12 15 15   Temp:  97.9 F (36.6 C) 98.5 F (36.9 C) 98.3 F (36.8 C)  TempSrc:  Axillary Oral Oral  SpO2: 95% 96% 93% 96%  Weight:  Height:       Examination: Physical Exam:  Constitutional: WN/WD obese Caucasian male in NAD and appears calm and comfortable Respiratory: Diminished to auscultation bilaterally with coarse breath sounds and some rhonchi worse on the Right compared to the Left, no wheezing, rales, rhonchi or crackles. No accessory muscle use.  Cardiovascular: RRR, no murmurs / rubs / gallops. S1 and S2 auscultated. Trace edema  Abdomen: Soft, non-tender, Distended. Bowel sounds positive.  GU: Deferred. Right PCN has been removed but condom cath in place Musculoskeletal: No clubbing / cyanosis of digits/nails. No joint deformity upper and lower extremities.   Data Reviewed:  I have independently reviewed and assessed the patient's clinical laboratory data including his CMP and his chest x-ray. Patient's BUN/Cr is now 22/1.33 and Glucose was 144  Family Communication: No family present at bedside   Disposition: Status is: Inpatient Remains inpatient appropriate because: Needs safe Swallowing and his Right PCN to be replaced   Planned Discharge Destination: Home with Home Health  DVT Prophylaxis: Enoxaparin 40 mg sq  q24h  Author: Raiford Noble, DO [facility]  08/22/2021 11:28 AM  For on call review [email or web address].

## 2021-08-22 NOTE — Consult Note (Signed)
Chief Complaint: Patient was seen in consultation today for right percutaneous nephrostomy replacement Chief Complaint  Patient presents with   Shortness of Breath   at the request of Dr Zollie Scale   Supervising Physician: Mir, Sharen Heck  Patient Status: [facility] - In-pt  History of Present Illness: Nicholas Mckenzie is a 76 y.o. male   Hx CVA; CKD- nephrolithiasis Rt PCN placed 03/2021-- exchanged 05/2021 HTN; OSA Nonfunctional right kidney  Known to IR Placed Rt PCN 10/22---exchanged 12/22 Accidentally dislodged Rt PCN last pm Pt unaware as to how this occurred  Request made for replacement Scheduled now for replacement/new placement  Past Medical History:  Diagnosis Date   CKD (chronic kidney disease) stage 3, GFR 30-59 ml/min ([place])    Hypertension    Kidney stones    Sleep apnea     Past Surgical History:  Procedure Laterality Date   CATARACT EXTRACTION     IR NEPHROSTOMY EXCHANGE RIGHT  06/07/2021   IR NEPHROSTOMY PLACEMENT RIGHT  04/07/2021   IR PATIENT EVAL TECH 0-60 MINS  06/02/2021   kidney stent     MANDIBLE FRACTURE SURGERY      Allergies: Statins  Medications: Prior to Admission medications   Medication Sig Start Date End Date Taking? Authorizing Provider  amLODipine (NORVASC) 5 MG tablet Take 1 tablet (5 mg total) by mouth in the morning and at bedtime. Patient taking differently: Take 10 mg by mouth in the morning and at bedtime. 06/08/21  Yes Hongalgi, Lenis Dickinson, MD  B Complex-C CAPS Take 1 capsule by mouth daily.   Yes [provider]  FLUoxetine (PROZAC) 40 MG capsule Take 40 mg by mouth daily.   Yes [provider]  lactobacillus (FLORANEX/LACTINEX) PACK Take 1 packet (1 g total) by mouth 2 (two) times daily. 04/13/21  Yes Kc, Maren Beach, MD  melatonin 3 MG TABS tablet Take 3 mg by mouth at bedtime.   Yes [provider]  Multiple Vitamins-Minerals (MULTIVITAMIN WITH MINERALS) tablet Take 1 tablet by mouth daily.   Yes [provider]  Omega-3 Fatty Acids (FISH OIL PO) Take 1 capsule by mouth daily.   Yes [provider]  aspirin EC 81 MG tablet Take 1 tablet (81 mg total) by mouth daily. Swallow whole. Patient not taking: Reported on 08/16/2021 07/19/21   Frann Rider, NP     Family History  Problem Relation Age of Onset   Hypertension Mother    Hypertension Other     Social History   Socioeconomic History   Marital status: Married    Spouse name: Not on file   Number of children: Not on file   Years of education: Not on file   Highest education level: Not on file  Occupational History   Not on file  Tobacco Use   Smoking status: Never   Smokeless tobacco: Never  Substance and Sexual Activity   Alcohol use: Yes    Comment: Socially; twice a week   Drug use: Not on file   Sexual activity: Not on file  Other Topics Concern   Not on file  Social History Narrative   Not on file   Social Determinants of Health   Financial Resource Strain: Not on file  Food Insecurity: Not on file  Transportation Needs: Not on file  Physical Activity: Not on file  Stress: Not on file  Social Connections: Not on file    Review of Systems: A 12 point ROS discussed and pertinent positives are indicated in  the HPI above.  All other systems are negative.    Vital Signs: BP (!) 153/83 (BP Location: Right Arm)    Pulse 85    Temp 98.3 F (36.8 C) (Oral)    Resp 15    Ht 5\' 8"  (1.727 m)    Wt 205 lb 0.4 oz (93 kg)    SpO2 96%    BMI 31.17 kg/m   Physical Exam Vitals reviewed.  HENT:     Mouth/Throat:     Mouth: Mucous membranes are moist.  Cardiovascular:     Rate and Rhythm: Normal rate and regular rhythm.  Pulmonary:     Breath sounds: Rhonchi present.     Comments: 02 2L  Lungs with diffuse rhonchi Bilat  Skin:    General: Skin is warm.  Neurological:     Mental Status: He is alert and oriented to person, place, and time.  Psychiatric:        Behavior: Behavior normal.     Imaging: DG Chest 1 View  Result Date: 08/16/2021 CLINICAL DATA:  Shortness of breath, acute respiratory failure with hypoxia EXAM: CHEST  1 VIEW COMPARISON:  Chest x-ray 08/15/2021, CT angiography chest 08/15/2021 FINDINGS: Enteric tube coursing below the hemidiaphragm. The heart and mediastinal contours are unchanged. Aortic calcification. Interval increase in right base patchy airspace opacity. No pulmonary edema. Likely trace right pleural effusion. No pneumothorax. No acute osseous abnormality. IMPRESSION: 1. Likely trace right pleural effusion. 2. Interval increase in right base patchy airspace opacity. Finding could represent a combination of infection/inflammation versus atelectasis. Electronically Signed   By: Iven Finn M.D.   On: 08/16/2021 23:28   CT SOFT TISSUE NECK WO CONTRAST  Result Date: 08/16/2021 CLINICAL DATA:  Possible aspiration swallowing a pill. Epiglottitis or tonsillitis suspected EXAM: CT NECK WITHOUT CONTRAST TECHNIQUE: Multidetector CT imaging of the neck was performed following the standard protocol without intravenous contrast. RADIATION DOSE REDUCTION: This exam was performed according to the departmental dose-optimization program which includes automated exposure control, adjustment of the mA and/or kV according to patient size and/or use of iterative reconstruction technique. COMPARISON:  04/04/2021 CTA of the neck FINDINGS: Pharynx and larynx: Low-density thickening of the supraglottic larynx especially at the aryepiglottic folds and to a limited degree at the epiglottis. Partially closed glottis at time of imaging. No retropharyngeal collection. Prominent upper thoracic esophagus but no discrete mass or persistence on reformats. Stable appearance from prior chest CT. Salivary glands: No inflammation, mass, or stone. Thyroid: No worrisome finding. Lymph nodes: None enlarged or abnormal density. Vascular: Negative. Limited intracranial: Negative. Visualized orbits:  Negative. Mastoids and visualized paranasal sinuses: Clear. Skeleton: No acute or aggressive process.  Cervical spondylosis Upper chest: No acute finding IMPRESSION: Submucosal edema in the supraglottic larynx. Electronically Signed   By: Jorje Guild M.D.   On: 08/16/2021 07:16   CT Angio Chest PE W/Cm &/Or Wo Cm  Result Date: 08/15/2021 CLINICAL DATA:  Tachycardia EXAM: CT ANGIOGRAPHY CHEST WITH CONTRAST TECHNIQUE: Multidetector CT imaging of the chest was performed using the standard protocol during bolus administration of intravenous contrast. Multiplanar CT image reconstructions and MIPs were obtained to evaluate the vascular anatomy. RADIATION DOSE REDUCTION: This exam was performed according to the departmental dose-optimization program which includes automated exposure control, adjustment of the mA and/or kV according to patient size and/or use of iterative reconstruction technique. CONTRAST:  32mL OMNIPAQUE IOHEXOL 350 MG/ML SOLN COMPARISON:  Chest x-ray 08/15/2021, CT abdomen pelvis 08/15/2021 FINDINGS: Cardiovascular: Satisfactory opacification of the  pulmonary arteries to the segmental level. No evidence of pulmonary embolism. Mild aortic atherosclerosis. No aneurysm. Normal cardiac size. No pericardial effusion Mediastinum/Nodes: Midline trachea. No suspicious thyroid nodule. No suspicious lymph nodes. Small hiatal hernia. Lungs/Pleura: No pleural effusion or pneumothorax. Mild nodularity and ground-glass density in the right lower lobe with debris within right lower lobe bronchi. Upper Abdomen: Partially visualized atrophic right kidney. Cysts upper pole left kidney Musculoskeletal: No acute osseous abnormality. Review of the MIP images confirms the above findings. IMPRESSION: 1. Negative for acute pulmonary embolus. 2. Interval finding of mildly nodular and ground-glass airspace disease in the right lung base with debris and or fluid within the bronchi, given clear lung bases on the CT  performed earlier today, suspect that findings could be secondary to aspiration. Aortic Atherosclerosis (ICD10-I70.0). Electronically Signed   By: Donavan Foil M.D.   On: 08/15/2021 18:41   MR BRAIN WO CONTRAST  Result Date: 08/16/2021 CLINICAL DATA:  Acute neuro deficit.  Rule out stroke EXAM: MRI HEAD WITHOUT CONTRAST TECHNIQUE: Multiplanar, multiecho pulse sequences of the brain and surrounding structures were obtained without intravenous contrast. COMPARISON:  CT head 07/18/2021.  MRI head 04/05/2021 FINDINGS: Brain: Negative for acute infarct. Chronic infarct right occipital lobe. Chronic microvascular ischemic change in the white matter. Chronic infarct in the pons and left middle cerebellar peduncle. Negative for hemorrhage or mass. Generalized atrophy. Vascular: Normal arterial flow voids. Skull and upper cervical spine: No focal skeletal lesion. Sinuses/Orbits: Mild mucosal edema paranasal sinuses. Bilateral cataract extraction Other: None IMPRESSION: Atrophy and chronic ischemic change. Negative for acute infarct. Electronically Signed   By: Franchot Gallo M.D.   On: 08/16/2021 11:25   DG CHEST PORT 1 VIEW  Result Date: 08/22/2021 CLINICAL DATA:  Shortness of breath EXAM: PORTABLE CHEST 1 VIEW COMPARISON:  Chest x-ray dated August 21, 2021 obtained at 06:47 FINDINGS: Cardiac and mediastinal contours are unchanged. Improved aeration of the lung bases, likely due to decreased atelectasis. No large pleural effusion or evidence of pneumothorax. Enteric feeding tube partially seen coursing below the diaphragm. IMPRESSION: Compared aeration of the lung bases, likely due to decreased atelectasis. Electronically Signed   By: Yetta Glassman M.D.   On: 08/22/2021 08:06   DG CHEST PORT 1 VIEW  Result Date: 08/21/2021 CLINICAL DATA:  Shortness of breath EXAM: PORTABLE CHEST 1 VIEW COMPARISON:  Previous studies including the examination of 08/19/2021 FINDINGS: Transverse diameter of heart is increased.  There are patchy infiltrates in right lower lung fields with possible slight improvement. No new infiltrates are seen. There are no signs of alveolar pulmonary edema. Enteric tube is noted traversing the esophagus. IMPRESSION: Patchy infiltrates are seen in the right lower lung fields suggesting atelectasis/pneumonia with interval improvement. Electronically Signed   By: Elmer Picker M.D.   On: 08/21/2021 09:34   DG CHEST PORT 1 VIEW  Result Date: 08/19/2021 CLINICAL DATA:  Acute respiratory failure with hypoxia. Follow-up exam. EXAM: PORTABLE CHEST 1 VIEW COMPARISON:  08/18/2021 and older studies. FINDINGS: Cardiac silhouette mildly enlarged.  No mediastinal or hilar masses. Right lung base opacity, without convincing change from the prior study allowing for differences in patient positioning and lung volumes. Remainder of the lungs is clear. No convincing pleural effusion or pneumothorax. Enteric tube passes below the diaphragm, unchanged. IMPRESSION: 1. Right lung base opacity consistent with atelectasis and/or pneumonia, without convincing change from the most recent prior study, less prominent than it was on 08/16/2021. 2. No other evidence of acute cardiopulmonary disease and  no convincing change from the previous day's study. Electronically Signed   By: Lajean Manes M.D.   On: 08/19/2021 09:01   DG CHEST PORT 1 VIEW  Result Date: 08/18/2021 CLINICAL DATA:  Shortness of breath, aspiration EXAM: PORTABLE CHEST 1 VIEW COMPARISON:  Portable exam [id number] hours compared to 08/16/2021 FINDINGS: Feeding tube extends into stomach. Enlargement of cardiac silhouette. Stable mediastinal contours and pulmonary vascularity. LEFT basilar subsegmental atelectasis with persistent atelectasis and consolidation at RIGHT lung base. No definite pleural effusion or pneumothorax. IMPRESSION: Enlargement of cardiac silhouette. Persistent atelectasis and infiltrate at RIGHT base. LEFT basilar subsegmental atelectasis.  Electronically Signed   By: Lavonia Dana M.D.   On: 08/18/2021 08:21   DG Chest Port 1 View  Result Date: 08/15/2021 CLINICAL DATA:  Shortness of breath. EXAM: PORTABLE CHEST 1 VIEW COMPARISON:  Two-view chest x-ray 06/06/2021 FINDINGS: Heart size is normal. Atherosclerotic changes are again seen at the aortic arch. Chronic interstitial markings present. No edema or effusion is present. No focal airspace disease is present. Degenerative changes are noted in the shoulders. IMPRESSION: No acute cardiopulmonary disease or significant interval change. Electronically Signed   By: San Morelle M.D.   On: 08/15/2021 16:28   DG Abd Portable 1V  Result Date: 08/17/2021 CLINICAL DATA:  NG tube placement EXAM: PORTABLE ABDOMEN - 1 VIEW COMPARISON:  08/16/2021 FINDINGS: Tip of enteric tube is seen in the region of distal antrum/pylorus of the stomach. There is percutaneous right nephrostomy catheter. Right ureteral stent is seen. There is contrast in the lumen of colon. Diverticula are seen in the colon. There is no significant small bowel dilation. IMPRESSION: Tip of enteric tube is seen in the region distal antrum/pylorus of the stomach. Electronically Signed   By: Elmer Picker M.D.   On: 08/17/2021 13:09   DG Abd Portable 1V  Result Date: 08/16/2021 CLINICAL DATA:  Feeding tube placement. EXAM: PORTABLE ABDOMEN - 1 VIEW COMPARISON:  None. FINDINGS: Feeding tube terminates near the pylorus. Visualized left lung base is clear. IMPRESSION: Pain tube terminates near the pylorus. Electronically Signed   By: Lorin Picket M.D.   On: 08/16/2021 16:25   DG Swallowing Func-Speech Pathology  Result Date: 08/16/2021 Table formatting from the original result was not included. Objective Swallowing Evaluation: Type of Study: MBS-Modified Barium Swallow Study  Patient Details Name: Tejuan Curler MRN: [id number] Date of Birth: [birth year] Today's Date: 08/16/2021 Time: SLP Start Time (ACUTE ONLY): 0810 -SLP Stop  Time (ACUTE ONLY): 0845 SLP Time Calculation (min) (ACUTE ONLY): 35 min Past Medical History: Past Medical History: Diagnosis Date  CKD (chronic kidney disease) stage 3, GFR 30-59 ml/min ([facility])   Hypertension   Kidney stones   Sleep apnea  Past Surgical History: Past Surgical History: Procedure Laterality Date  CATARACT EXTRACTION    IR NEPHROSTOMY EXCHANGE RIGHT  06/07/2021  IR NEPHROSTOMY PLACEMENT RIGHT  04/07/2021  IR PATIENT EVAL TECH 0-60 MINS  06/02/2021  kidney stent    MANDIBLE FRACTURE SURGERY   HPI: Pt is a 76 yo male adm to [facility] after concern for aspiration event with pill.  Pt CT chest showed "Interval finding of mildly nodular and ground-glass airspace  disease in the right lung base with debris and or fluid within the  bronchi, given clear lung bases on the CT performed earlier today,  suspect that findings could be secondary to aspiration."  Swallow eval ordered. Pertinent hx includes SDH 05/2021, Chronic lacunar infarcts within the right basal ganglia, Brain imaging 10/22 cortical/subcortical  infarct within the  right parietooccipital lobes and callosal splenium (right PCA  vascular territory), old right occipital cva, delirium and COVID.  Pt reports he tried to swallow pill dry and this resulted in aspiration.  Per CT neck, pt has Submucosal edema in the supraglottic larynx.  MBS indicated.  Subjective: pt in bed, assist to slide into flouro chair  Recommendations for follow up therapy are one component of a multi-disciplinary discharge planning process, led by the attending physician.  Recommendations may be updated based on patient status, additional functional criteria and insurance authorization. Assessment / Plan / Recommendation Clinical Impressions 08/16/2021 Clinical Impression Patient currently presents with severe pharyngo with suspected cervical esophageal dysphagia with sensorimotor deficits.  Minimally decreased oral control noted but primary deficit is pharyngeal.  Delayed swallow trigger  to pyriform sinus with liquids as well as impaired tongue base retraction/airway closure allows aspiration of secretions, laryngeal penetration and aspiration of all consistencies tested.  Pt without consistent sensation to aspiration and reflexive nor cued cough effectively cleared aspirates.  Various postures including chin tuck with and without head turn left and chair reclined to approx 45* did not completely prevent penetration/aspiration.  Chair reclined effective x1 with 1/2 tsp nectar only to prevent aspiration.  In addition, if pt cleared larynx, he repenetrated/aspirated due to pharyngeal retention.  SLP questions if pt's Submucosal edema in the supraglottic larynx may be negatively impacting laryngeal closure - contributing to his dysphagia.  Educated pt during testing using video feedback.  Pt with h/o dysphagia per his statement, with nasal regurgitation if eating too fast and a "little coughing" with po intake. Suspect some component of chronic dysphagia/aspiration due to prior CVA that pt has managed due to mobiilty, strength of cough, overall health, etc until this recent severe aspiration episode. At this time, due to pt report of severe worsening dysphagia,  recommend consider short term alternative means of nutrition and SLP follow up for dysphagia management.  Repeat MBS will be indicated prior to intiation of po diet due to pt's sensorimotor deficits.   SLP Visit Diagnosis Dysphagia, pharyngeal phase (R13.13);Dysphagia, unspecified (R13.10) Attention and concentration deficit following -- Frontal lobe and executive function deficit following -- Impact on safety and function Severe aspiration risk;Risk for inadequate nutrition/hydration   Treatment Recommendations 08/16/2021 Treatment Recommendations Therapy as outlined in treatment plan below   Prognosis 08/16/2021 Prognosis for Safe Diet Advancement Fair Barriers to Reach Goals Severity of deficits Barriers/Prognosis Comment -- Diet  Recommendations 08/16/2021 SLP Diet Recommendations NPO;Ice chips PRN after oral care Liquid Administration via -- Medication Administration Via alternative means Compensations Multiple dry swallows after each bite/sip;Other (Comment) Postural Changes --   Other Recommendations 08/16/2021 Recommended Consults -- Oral Care Recommendations Oral care QID Other Recommendations -- Follow Up Recommendations Home health SLP Assistance recommended at discharge Frequent or constant Supervision/Assistance Functional Status Assessment Patient has had a recent decline in their functional status and demonstrates the ability to make significant improvements in function in a reasonable and predictable amount of time. Frequency and Duration  08/16/2021 Speech Therapy Frequency (ACUTE ONLY) min 2x/week Treatment Duration 1 week   Oral Phase 08/16/2021 Oral Phase Impaired Oral - Pudding Teaspoon -- Oral - Pudding Cup -- Oral - Honey Teaspoon -- Oral - Honey Cup -- Oral - Nectar Teaspoon WFL Oral - Nectar Cup -- Oral - Nectar Straw -- Oral - Thin Teaspoon WFL Oral - Thin Cup -- Oral - Thin Straw WFL Oral - Puree WFL Oral - Mech Soft -- Oral -  Regular -- Oral - Multi-Consistency -- Oral - Pill -- Oral Phase - Comment --  Pharyngeal Phase 08/16/2021 Pharyngeal Phase Impaired Pharyngeal- Pudding Teaspoon -- Pharyngeal -- Pharyngeal- Pudding Cup -- Pharyngeal -- Pharyngeal- Honey Teaspoon -- Pharyngeal -- Pharyngeal- Honey Cup -- Pharyngeal -- Pharyngeal- Nectar Teaspoon Delayed swallow initiation-pyriform sinuses;Penetration/Aspiration during swallow;Penetration/Apiration after swallow;Inter-arytenoid space residue;Lateral channel residue;Reduced pharyngeal peristalsis;Penetration/Aspiration before swallow;Reduced airway/laryngeal closure;Pharyngeal residue - valleculae;Reduced tongue base retraction;Reduced epiglottic inversion Pharyngeal Material enters airway, passes BELOW cords without attempt by patient to eject out (silent  aspiration);Material enters airway, passes BELOW cords and not ejected out despite cough attempt by patient Pharyngeal- Nectar Cup -- Pharyngeal -- Pharyngeal- Nectar Straw -- Pharyngeal -- Pharyngeal- Thin Teaspoon Reduced pharyngeal peristalsis;Moderate aspiration;Pharyngeal residue - pyriform;Pharyngeal residue - valleculae;Delayed swallow initiation-pyriform sinuses;Reduced airway/laryngeal closure Pharyngeal Material enters airway, passes BELOW cords without attempt by patient to eject out (silent aspiration) Pharyngeal- Thin Cup -- Pharyngeal -- Pharyngeal- Thin Straw Reduced pharyngeal peristalsis;Penetration/Aspiration during swallow;Penetration/Apiration after swallow;Significant aspiration (Amount);Inter-arytenoid space residue;Lateral channel residue;Reduced airway/laryngeal closure;Reduced epiglottic inversion;Reduced tongue base retraction;Pharyngeal residue - valleculae;Pharyngeal residue - pyriform Pharyngeal Material enters airway, passes BELOW cords without attempt by patient to eject out (silent aspiration);Material enters airway, passes BELOW cords and not ejected out despite cough attempt by patient Pharyngeal- Puree Lateral channel residue;Inter-arytenoid space residue;Penetration/Apiration after swallow;Reduced pharyngeal peristalsis;Penetration/Aspiration during swallow;Reduced airway/laryngeal closure;Reduced epiglottic inversion;Reduced tongue base retraction;Pharyngeal residue - valleculae Pharyngeal Material enters airway, passes BELOW cords without attempt by patient to eject out (silent aspiration);Material enters airway, passes BELOW cords and not ejected out despite cough attempt by patient Pharyngeal- Mechanical Soft -- Pharyngeal -- Pharyngeal- Regular -- Pharyngeal -- Pharyngeal- Multi-consistency -- Pharyngeal -- Pharyngeal- Pill -- Pharyngeal -- Pharyngeal Comment Chin tuck with and without head turn, reclined to approx 45* did not effectively protect airway.  Multiple swallows  aids pharyngeal clearance.  Shallow pyriform sinus noted which allows spillage of barium into larynx/trachea before and postswallow.   Reflexive and cued cough did not fully clear aspirates mixed with secretions.  Pt propelled some of pharyngeal retention into oral cavity and expectorated during MBS.  Aspiration occured with nectar before the swallow as barium spilled from pyriform sinus into open larynx before swallow.  Cervical Esophageal Phase  08/16/2021 Cervical Esophageal Phase Impaired Pudding Teaspoon -- Pudding Cup -- Honey Teaspoon -- Honey Cup -- Nectar Teaspoon -- Nectar Cup -- Nectar Straw -- Thin Teaspoon -- Thin Cup -- Thin Straw -- Puree -- Mechanical Soft -- Regular -- Multi-consistency -- Pill -- Cervical Esophageal Comment Suboptimal view due to pt's shoulders paritally blocking proximal esophagus; Upon esophageal sweep, pt appeared with minimal amount of barium that appeared mixed with secretions - radiologist not present to confirm. Macario Golds 08/16/2021, 10:11 AM    Kathleen Lime, MS [facility] SLP Acute Rehab Services Office [phone] Pager [phone]                  SLEEP STUDY DOCUMENTS  Result Date: 08/11/2021 Ordered by an unspecified provider.   Labs:  CBC: Recent Labs    08/18/21 0550 08/19/21 0420 08/20/21 0234 08/21/21 0356  WBC 8.9 8.7 6.8 7.7  HGB 12.6* 14.2 13.2 13.5  HCT 39.3 44.9 40.1 43.1  PLT 181 225 207 237    COAGS: Recent Labs    04/06/21 1506 06/06/21 1448  INR 1.2 1.0    BMP: Recent Labs    08/19/21 0420 08/20/21 0234 08/21/21 0356 08/22/21 0224  NA 144 144 144 145  K 3.6 3.4* 4.1  3.7  CL 107 106 107 109  CO2 27 30 27 27   GLUCOSE 117* 124* 103* 144*  BUN 12 16 19 22   CALCIUM 9.7 9.4 9.5 9.6  CREATININE 1.20 1.15 1.26* 1.33*  GFRNONAA >60 >60 60* 56*    LIVER FUNCTION TESTS: Recent Labs    08/19/21 0420 08/20/21 0234 08/21/21 0356 08/22/21 0224  BILITOT 0.8 0.3 0.5 0.4  AST 18 16 17 17   ALT 17 19 20 20   ALKPHOS 55 47  50 45  PROT 6.9 6.5 6.5 6.7  ALBUMIN 3.1* 2.9* 2.9* 3.1*    TUMOR MARKERS: No results for input(s): AFPTM, CEA, CA199, CHROMGRNA in the last 8760 hours.  Assessment and Plan:  Known to IR Rt percutaneous nephrostomy placed 10/22 Exchanged 12/22 In hospital for hypoxia  Rt PCN dislodged inadvertently Scheduled for replacement in IR Risks and benefits of Right PCN replacement/placement was discussed with the patient including, but not limited to, infection, bleeding, significant bleeding causing loss or decrease in renal function or damage to adjacent structures.   All of the patient's questions were answered, patient is agreeable to proceed.  Consent signed and in chart.   Thank you for this interesting consult.  I greatly enjoyed meeting Bastien Coody and look forward to participating in their care.  A copy of this report was sent to the requesting provider on this date.  Electronically Signed: Lavonia Drafts, PA-C 08/22/2021, 10:06 AM   I spent a total of 20 Minutes    in face to face in clinical consultation, greater than 50% of which was counseling/coordinating care for right percutaneous nephrostomy replacement

## 2021-08-22 NOTE — Progress Notes (Signed)
Pt placed on CPAP for night rest tolerating well  

## 2021-08-23 ENCOUNTER — Inpatient Hospital Stay (HOSPITAL_COMMUNITY): Payer: Medicare Other

## 2021-08-23 DIAGNOSIS — E870 Hyperosmolality and hypernatremia: Secondary | ICD-10-CM | POA: Diagnosis not present

## 2021-08-23 LAB — CBC WITH DIFFERENTIAL/PLATELET
Abs Immature Granulocytes: 0.08 10*3/uL — ABNORMAL HIGH (ref 0.00–0.07)
Basophils Absolute: 0 10*3/uL (ref 0.0–0.1)
Basophils Relative: 0 %
Eosinophils Absolute: 0.2 10*3/uL (ref 0.0–0.5)
Eosinophils Relative: 3 %
HCT: 39.6 % (ref 39.0–52.0)
Hemoglobin: 12.8 g/dL — ABNORMAL LOW (ref 13.0–17.0)
Immature Granulocytes: 1 %
Lymphocytes Relative: 17 %
Lymphs Abs: 1.3 10*3/uL (ref 0.7–4.0)
MCH: 28.1 pg (ref 26.0–34.0)
MCHC: 32.3 g/dL (ref 30.0–36.0)
MCV: 87 fL (ref 80.0–100.0)
Monocytes Absolute: 0.8 10*3/uL (ref 0.1–1.0)
Monocytes Relative: 11 %
Neutro Abs: 5.4 10*3/uL (ref 1.7–7.7)
Neutrophils Relative %: 68 %
Platelets: 234 10*3/uL (ref 150–400)
RBC: 4.55 MIL/uL (ref 4.22–5.81)
RDW: 14.9 % (ref 11.5–15.5)
WBC: 7.8 10*3/uL (ref 4.0–10.5)
nRBC: 0 % (ref 0.0–0.2)

## 2021-08-23 LAB — COMPREHENSIVE METABOLIC PANEL
ALT: 18 U/L (ref 0–44)
AST: 16 U/L (ref 15–41)
Albumin: 2.9 g/dL — ABNORMAL LOW (ref 3.5–5.0)
Alkaline Phosphatase: 49 U/L (ref 38–126)
Anion gap: 8 (ref 5–15)
BUN: 23 mg/dL (ref 8–23)
CO2: 31 mmol/L (ref 22–32)
Calcium: 9.5 mg/dL (ref 8.9–10.3)
Chloride: 108 mmol/L (ref 98–111)
Creatinine, Ser: 1.51 mg/dL — ABNORMAL HIGH (ref 0.61–1.24)
GFR, Estimated: 48 mL/min — ABNORMAL LOW (ref >60)
Glucose, Bld: 150 mg/dL — ABNORMAL HIGH (ref 70–99)
Potassium: 3.7 mmol/L (ref 3.5–5.1)
Sodium: 147 mmol/L — ABNORMAL HIGH (ref 135–145)
Total Bilirubin: 0.4 mg/dL (ref 0.3–1.2)
Total Protein: 6.3 g/dL — ABNORMAL LOW (ref 6.5–8.1)

## 2021-08-23 LAB — PHOSPHORUS: Phosphorus: 4.5 mg/dL (ref 2.5–4.6)

## 2021-08-23 LAB — SEDIMENTATION RATE: Sed Rate: 80 mm/hr — ABNORMAL HIGH (ref 0–16)

## 2021-08-23 LAB — MAGNESIUM: Magnesium: 2.6 mg/dL — ABNORMAL HIGH (ref 1.7–2.4)

## 2021-08-23 LAB — GLUCOSE, CAPILLARY
Glucose-Capillary: 133 mg/dL — ABNORMAL HIGH (ref 70–99)
Glucose-Capillary: 102 mg/dL — ABNORMAL HIGH (ref 70–99)
Glucose-Capillary: 80 mg/dL (ref 70–99)
Glucose-Capillary: 103 mg/dL — ABNORMAL HIGH (ref 70–99)

## 2021-08-23 MED ORDER — DEXTROSE 5 % IV SOLN: INTRAVENOUS | Status: DC

## 2021-08-23 MED ORDER — METOPROLOL TARTRATE 5 MG/5ML IV SOLN
5.0000 mg | Freq: Three times a day (TID) | INTRAVENOUS | Status: DC
Start: 2021-08-23 — End: 2021-08-25
  Administered 2021-08-23 – 2021-08-25 (×6): 5 mg via INTRAVENOUS
  Filled 2021-08-23 (×6): qty 5

## 2021-08-23 NOTE — Progress Notes (Signed)
Occupational Therapy Treatment ?Patient Details ?Name: Nicholas Mckenzie ?MRN: [id number] ?DOB: 08-Oct-1945 ?Today's Date: 08/23/2021 ? ? ?History of present illness 76 y.o. male admitted 2/21 with SOB and tachycardia with possibly choking on pill. Pt with acute respiratory failure. PMhx. 06/06/2021 fall with SDH, CKD stage IIIa, nephrostomy tube, sleep apnea, HTN, OSA, depression/anxiety, CVA with residual left hemiparesis. ?  ?OT comments ? Chart reviewed, pt greeted in bed agreeable to OT tx session. Tx session targeted progressing functional mobility, participation/safe participation in ADL tasks. Pt is making progress towards goals as evidenced with improved static and dynamic sitting balance at edge of bed while participating in ADL tasks. Pt cleared for po, consumed food with SET UP and frequent vcs for appropriate technique. Pt continues to perform below PLOF therefore will continue to benefit from skilled OT to address functional deficits. Discharge recommendations remain appropriate. OT will continue to follow while admitted.   ? ?Recommendations for follow up therapy are one component of a multi-disciplinary discharge planning process, led by the attending physician.  Recommendations may be updated based on patient status, additional functional criteria and insurance authorization. ?   ?Follow Up Recommendations ? Home health OT  ?  ?Assistance Recommended at Discharge Frequent or constant Supervision/Assistance  ?Patient can return home with the following ? A little help with walking and/or transfers;A little help with bathing/dressing/bathroom;Assistance with cooking/housework;Direct supervision/assist for medications management;Direct supervision/assist for financial management;Assist for transportation;Help with stairs or ramp for entrance ?  ?Equipment Recommendations ?    ?  ?Recommendations for Other Services   ? ?  ?Precautions / Restrictions Precautions ?Precautions: Fall ?Precaution Comments: nephrostomy  tube ?Restrictions ?Weight Bearing Restrictions: No  ? ? ?  ? ?Mobility Bed Mobility ?Overal bed mobility: Needs Assistance ?Bed Mobility: Supine to Sit, Sit to Supine ?  ?  ?Supine to sit: Supervision ?Sit to supine: Supervision ?  ?General bed mobility comments: use of bed rails ?  ? ?Transfers ?  ?  ?Transfers: Sit to/from Stand ?Sit to Stand: Min guard ?  ?  ?  ?  ?  ?  ?  ?  ?Balance Overall balance assessment: Needs assistance ?Sitting-balance support: No upper extremity supported, Feet supported ?Sitting balance-Leahy Scale: Good ?  ?  ?Standing balance support: No upper extremity supported, During functional activity ?Standing balance-Leahy Scale: Fair ?  ?  ?  ?  ?  ?  ?  ?  ?  ?  ?  ?  ?   ? ?ADL either performed or assessed with clinical judgement  ? ?ADL Overall ADL's : Needs assistance/impaired ?Eating/Feeding: Sitting;Set up ?Eating/Feeding Details (indicate cue type and reason): at edge of bed, frequent vcs for safe swallowing/feeding techniques ?  ?  ?  ?  ?  ?  ?Upper Body Dressing : Minimal assistance;Sitting ?Upper Body Dressing Details (indicate cue type and reason): gown ?Lower Body Dressing: Minimal assistance ?Lower Body Dressing Details (indicate cue type and reason): socks ?  ?  ?Toileting- Clothing Manipulation and Hygiene: Minimal assistance;Sit to/from stand ?  ?  ?  ?Functional mobility during ADLs: Rolling walker (2 wheels);Min guard ?General ADL Comments: STS with CGA for toileting at EOB, one step up the bed with CGA ?  ? ?Extremity/Trunk Assessment   ?  ?  ?  ?  ?  ? ?Vision   ?  ?  ?Perception   ?  ?Praxis   ?  ? ?Cognition Arousal/Alertness: Awake/alert ?Behavior During Therapy: [facility] for tasks assessed/performed ?Overall Cognitive Status:  Within Functional Limits for tasks assessed ?  ?  ?  ?  ?  ?  ?  ?  ?  ?  ?  ?  ?  ?  ?  ?  ?General Comments: improved cognition on this date, however pt requires vcs for safe self feeding; will cotinue to asses ?  ?  ?   ?Exercises   ? ?   ?Shoulder Instructions   ? ? ?  ?General Comments vss throughout  ? ? ?Pertinent Vitals/ Pain       Pain Assessment ?Pain Assessment: No/denies pain ? ?Home Living   ?  ?  ?  ?  ?  ?  ?  ?  ?  ?  ?  ?  ?  ?  ?  ?  ?  ?  ? ?  ?Prior Functioning/Environment    ?  ?  ?  ?   ? ?Frequency ? Min 2X/week  ? ? ? ? ?  ?Progress Toward Goals ? ?OT Goals(current goals can now be found in the care plan section) ? Progress towards OT goals: Progressing toward goals ? ?Acute Rehab OT Goals ?OT Goal Formulation: With patient ?Time For Goal Achievement: 09/06/21 ?Potential to Achieve Goals: Good  ?Plan Discharge plan remains appropriate   ? ?Co-evaluation ? ? ?   ?  ?  ?  ?  ? ?  ?AM-PAC OT "6 Clicks" Daily Activity     ?Outcome Measure ? ? Help from another person eating meals?: None ?Help from another person taking care of personal grooming?: A Little ?Help from another person toileting, which includes using toliet, bedpan, or urinal?: A Little ?Help from another person bathing (including washing, rinsing, drying)?: A Little ?Help from another person to put on and taking off regular upper body clothing?: A Little ?Help from another person to put on and taking off regular lower body clothing?: A Little ?6 Click Score: 19 ? ?  ?End of Session   ? ?OT Visit Diagnosis: Unsteadiness on feet (R26.81);Muscle weakness (generalized) (M62.81);Other symptoms and signs involving cognitive function;Hemiplegia and hemiparesis ?  ?Activity Tolerance Patient tolerated treatment well ?  ?Patient Left in bed;with call bell/phone within reach;with bed alarm set;with family/visitor present ?  ?Nurse Communication Mobility status ?  ? ?   ? ?Time: [id number] ?OT Time Calculation (min): 28 min ? ?Charges: OT General Charges ?$OT Visit: 1 Visit ?OT Treatments ?$Self Care/Home Management : 23-37 mins ? ?Shanon Payor, OTD OTR/L  ?08/23/21, 3:37 PM  ?

## 2021-08-23 NOTE — Progress Notes (Signed)
Modified Barium Swallow Progress Note ? ?Patient Details  ?Name: [name] ?MRN: [id number] ?Date of Birth: 05-01-1946 ? ?Today's Date: 08/23/2021 ? ?Modified Barium Swallow completed.  Full report located under Chart Review in the Imaging Section. ? ?Brief recommendations include the following: ? ?Clinical Impression ? Patient demonstrating a significantly improved swallow function as compared to MBS on 2/22. He exhibited mildly delayed oral transit of puree, nectar and thin liquid consistencies and mildly impaired mastication of dysphagia 2 solids. During pharyngeal phase, patient exhibited swallow initiation delay at level of vallecular sinus with nectar thick liquids and teaspoon sip thin liquids and swallow initiation delay at level of pyriform sinus with thin liquids via cup and straw sips. When taking larger cup sip and straw sip, patient with trace flash penetration of thin liquids which was well above vocal cords and fully cleared. Barium tablet taken with puree solids transited pharyngeally without difficulty and esophageal sweep did not reveal any s/s dysmotility or stasis. SLP is recommending to initiate Dys 2 solids, thin liquids diet at this time and will follow for diet toleration and ability to trial upgraded solids. ?  ?Swallow Evaluation Recommendations ? ?   ? ? SLP Diet Recommendations: Dysphagia 2 (Fine chop) solids;Thin liquid ? ? Liquid Administration via: Cup;Straw ? ? Medication Administration: Whole meds with puree ? ? Supervision: Patient able to self feed;Full supervision/cueing for compensatory strategies ? ? Compensations: Minimize environmental distractions;Slow rate;Small sips/bites;Other (Comment) (intermittent cue to "swallow") ? ?   ? ? Oral Care Recommendations: Oral care BID ? ?   ? ? ? ?Angela Nevin, MA, CCC-SLP ?Speech Therapy ? ? ?

## 2021-08-23 NOTE — Progress Notes (Signed)
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  He was n.p.o. prior to the scan but decided to take his Prozac pill before going to scan did not use water and he felt like he choked on the pill.  He started having some mild trouble breathing and elevated blood pressure but decided to proceed with a CT scan anyway and then went home and then took his Norvasc and drink some juice.  He started having more trouble throughout the day with his breathing and I brought him to the ED and he was noted to be hypertensive, tachycardic and hypoxic.  Subsequently the patient had likely aspirated his pills and had to be placed on supplemental oxygen via nasal cannula and improved.  He had to be placed on 13 L of supplemental oxygen and when he came to the ED, he underwent a CT scan of the chest which showed aspiration findings.  He was admitted to the stepdown progressive unit and admitted for his hypoxia after aspiration.  Patient became extremely short of breath and began having a persistent cough and patient's wife has noticed that his voice is become more of a whisper.  Patient also complained of sore throat.  In the ED he was hypoxic requiring at least 13 L of supplemental oxygen which was then weaned to 8 L.  CTA of the chest was negative for PE but did show  findings concerning for aspiration.  His labs did show leukocytosis and patient was febrile with a temperature of 102 and a COVID test was negative.  He was initially on empiric antibiotics and admitted for further work-up and admitted for sepsis secondary to aspiration pneumonia.  **Interim History ENT was consulted for the edema of the supraglottis and they recommended initially evaluating with a scope but patient did not want to do this so they are holding off for now.  Given slightly worsening pulmonary status overnight ENT and no new recommendations recommended continue n.p.o. for now.  Overnight the patient appeared more short of breath and had a change in consistency and inquiry of his sputum.  He is no longer bringing up large amounts of thick copious green sputum and his chest x-ray revealed substantial increase in his right basilar opacity compared to his prior chest x-ray.  His antibiotics were changed to Unasyn and his tube feedings were discontinued given that there is concern for ongoing aspiration due to malfunctioning tube feeding leading to worsening aspiration.  His tube feeds were resumed today after his Cortrak was unclogged.  He continues to be intermittently on and off oxygen But his respiratory status is relatively stable  He has been intermittently somnolent he day before he was more somnolent as he did not have a good night's rest. 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 SLP continues to recommend NPO.   Urology was consulted and patient had Neph tube 06/07/21. His Right Neph Tube was accidentally pulled overnight so we will consult IR for replacement.  Will continue to monitor renal function as it is slowly trending upwards and is now 22/1.33  Continue to monitor his respiratory status carefully and repeat chest x-ray this morning shows some interval  improvement.  Will obtain repeat SLP evaluation to assess his swallowing and the plan is to pull a core track in the morning and do an MBS and then replace if necessary.    Assessment & Plan:  Principal Problem:   Acute respiratory failure with hypoxia (HCC) Active Problems:   CKD (chronic kidney disease) stage 3a   Hypertension   Hypokalemia   H/O ischemic right PCA stroke   Aspiration pneumonia (HCC)   Severe sepsis (HCC)   Sore throat   Hx of subdural hematoma   Nephrolithiasis   Stage 3a chronic kidney disease (CKD) (HCC)   Obesity (BMI 30-39.9)   OSA (obstructive sleep apnea)   Malnutrition of moderate degree   Hypernatremia    Assessment and Plan: * Acute respiratory failure with hypoxia (HCC)- (present on admission) -In the setting of aspiration event and aspiration pneumonia -Patient aspirated on his Protonix pill and became extremely short of breath going on for a CT scan for his last urology evaluation. -Initiated on empiric antibiotics with IV ceftriaxone and azithromycin which was subsequently changed to IV Unasyn given worsening respiratory status. -Continue treatment as below delineated in sepsis and aspiration pneumonia -SLP evaluated and recommending n.p.o. except ice chips early on the hospitalization and contract placed. -Coretrack removed and patient followed by speech therapy; -Patient subsequently underwent modified barium swallow today with improvement with swallowing and started on a dysphagia 2 diet. -Sats of 98% on 4 L nasal cannula. -; Was almost weaned off of O2 yesterday but now back on it again  -Continuous pulse oximetry and maintain O2 saturations greater than 90% -Continue supplemental oxygen via nasal cannula wean O2 as tolerated -Patient's voice has become more whisper so a CT scan of the neck soft tissue was done and showed "Submucosal edema in the supraglottic larynx. -ENT consulted about CT Soft Tissue Neck Findings and Dr. Jearld Fenton will come  assess the patient and he had recommended scope and the patient the patient refused at this time and will continue monitor patient's respiratory status carefully and then consider scoping in a few days but now recommending outpatient follow up.  -ENT feels that he would benefit from a vocal cord augmentation once he recovers -Repeat CXR intermittently     Hypernatremia - D5W for the next 24 hours. -Repeat labs in the AM.  Malnutrition of moderate degree Nutrition Status: Nutrition Problem: Moderate Malnutrition (in the context of social/environmental circumstances) Etiology: dysphagia Signs/Symptoms: mild muscle depletion, mild fat depletion, percent weight loss (10.3% x 6 months) Percent weight loss: 10.3 % Interventions: Refer to RD note for recommendations -Nutritionist Consulted and appreciate Recc's -Patient seen by speech therapy, underwent MBS, currently on a dysphagia 2 diet.     OSA (obstructive sleep apnea) -CPAP nightly    Obesity (BMI 30-39.9) -Complicates overall prognosis and care -Estimated body mass index is 31.38 kg/m as calculated from the following:   Height as of this encounter: 5\' 8"  (1.727 m).   Weight as of this encounter: 93.6 kg.  -Weight Loss and Dietary Counseling given  Stage 3a chronic kidney disease (CKD) (HCC) -Patient's BUN/creatinine is currently at baseline slightly worsened -BUN/creatinine went from 23/1.40 -> 17/1.58 ->  12/1.36 -> 12/1.27 -> 12/1.20 -> 16/1.15 -> 19/1.26 -> 22/1.33>> 23/1.76. -Has a Right Nephrostomy tube but has been accidentally dislodged so IR was consulted for further evaluation and Replacement. -Status post right nephrostomy tube exchange successfully 08/22/2021. -Urine output of 10.503 L over the past 24 hours and patient is -7.970 L during this hospitalization. -Avoid further nephrotoxic medications,  hypotension renally dose medications -Repeat CMP in the a.m.  Nephrolithiasis -Plan was to have a nephrectomy in  the coming weeks -Patient sees Dr. Tresa Moore of Urology as an outpatient -CT Renal Stone study done as an outpatient which we need to get Results  -Notified Dr. Tresa Moore that patient is currently hospitalized with Aspiration as a courtesy; Dr. Tresa Moore has seen the patient and is planning on rescheduling outpatient appointment.  Hx of subdural hematoma -Was presently obtaining CT in anticipation for a right-sided nephrectomy  later this week however he aspirated un the CT Scanner -Given concern for worsening weakness compared to his baseline MRI was done which was negative for acute CVA. -PT/OT to evaluate and Treat and recommending Home Health PT once ready to be D/C'd home  Sore throat -CT Neck as Above  -CT of the neck done and showed "Submucosal edema in the supraglottic larynx." -ENT to Evaluate and appreciate Dr. Janace Hoard evaluating.  Dr. Janace Hoard had recommended a scope but patient has initially refused.  They recommend continuing to follow to see if the patient proves for scoping.  His hoarseness is starting to improve a little bit and he continues to have no stridor or airway noise. -ENT recommends continuing n.p.o. for now and the patient is doing a little bit better and feel that his voice is respiratory with no breathing issues bilateral.  They feel that his vocal cord weakness that may be why he is aspirating more and they feel that a vocal cord augmentation may be helpful after he recovers more.  They are recommending outpatient evaluation  Severe sepsis ([place]) -Patient aspirated and presented with fever, tachycardia, tachypnea and a source of infection with aspiration findings -Continuing with antibiotics IV Unasyn; 8/10 -SLP done and recommending n.p.o. initially.  -Patient underwent modified barium swallow 08/23/2021 which showed mild aspiration risk but significant improvement and diet advanced to a dysphagia 2 diet.  -Blood cultures ordered negative x5 days.  -Respiratory panel done by PCR  showed negative influenza A and negative influenza B as well as SARS-CoV-2 testing negative by PCR -CTA of the chest PE protocol done and showed "Negative for acute pulmonary embolus. Interval finding of mildly nodular and ground-glass airspace disease in the right lung base with debris and or fluid within the bronchi, given clear lung bases on the CT performed earlier today, suspect that findings could be secondary to aspiration." -Last chest x-ray done and showed "Likely trace right pleural effusion.Interval increase in right base patchy airspace opacity. Finding could represent a combination of infection/inflammation versus atelectasis". -Continue scheduled Xopenex and Atrovent, flutter valve, incentive spirometry, guaifenesin.   -Budesonide 0.25 mg nebs twice daily and continue monitor his respiratory status carefully -IVF now stopped but resumed for the next 24 hours due to hypernatremia. -Given his generalized weakness and worsening swallow function MRI was done to evaluate for CVA and this was negative -Urinalysis done nitrite negative, leukocytes negative, 11-20 WBCs -Blood cultures negative x5 days. -WBC went from 16.1 -> 14.2 -> 11.2 ->  8.9 -> 8.7 -> 6.8 >> 7.7>>> 7.8 -NG Cortrak was placed due to the inability to swallow; -Cortrak discontinued and patient underwent modified barium swallow with mild aspiration risk and diet advanced to a dysphagia 2 diet per SLP.   -Repeat chest x-ray showed "Cardiac and mediastinal contours are unchanged. Improved aeration of the lung bases, likely due to decreased atelectasis. No large pleural effusion or evidence of pneumothorax. Enteric feeding tube partially seen coursing below the diaphragm." -Continue empiric IV antibiotics.    Aspiration pneumonia ([place])- (present on admission) -Patient aspirated PTA and presented with fever, tachycardia, tachypnea and a source of infection with aspiration findings -Was on IV Rocephin and IV azithromycin and  transition to IV Unasyn due to worsening respiratory status.  -SLP done and recommending n.p.o. early on during the hospitalization and coretrak placed, patient reassessed by SLP and underwent modified barium swallow with mild aspiration risk and clinical improvement and patient subsequently started on a dysphagia 2 diet. -Cortrak discontinued. -Blood cultures with no growth to date x5 days. -Respiratory panel done by PCR showed negative influenza A and negative influenza B as well as SARS-CoV-2 testing negative by PCR -CTA of the chest PE protocol done and showed "Negative for acute pulmonary embolus. Interval finding of mildly nodular and ground-glass airspace disease in the right lung base with debris and or fluid within the bronchi, given clear lung bases on the CT performed earlier today, suspect that findings could be secondary to aspiration." -Repeat CXR done and showed "Likely trace right pleural effusion. Interval increase in right base patchy airspace opacity. Finding could represent a combination of infection/inflammation versus atelectasis." -Given his generalized weakness and worsening swallow function MRI was done to evaluate for CVA and this was negative -Continue Xopenex and Atrovent as well as Pulmicort.  Also placed on Guaifenesin via tube as well as flutter valve incentive spirometry -Continue to follow cultures;  -WBC went from 16.1 -> 14.2 -> 11.2 -> 8.9 -> 8.7 -> 6.8 -> 7.7>> 7.8 -Repeat CXR showing some decreased atelectasis -Continue to Monitor Respiratory Status carefully  H/O ischemic right PCA stroke -See Hx of Subdural Hematoma  Hypokalemia- (present on admission) -Patient's K+ now is 3.7 -Mag Level is now 2.6. -Continue to Monitor and Replete as Necessary -Repeat BMET in the AM   Hypertension- (present on admission) -Was n.p.o. this morning and as such patient started on IV Lopressor.   -Patient diet advanced to MBS.   -If continues to tolerate diet in the next  24 hours could resume home regimen Norvasc.    CKD (chronic kidney disease) stage 3a- (present on admission) -Patient's BUNs/creatinine is relatively stable last few days and is now 12/1.36 -> 12/1.27 -> 12/1.20 -> 16/1.15 -> 19/1.26 -> 22/1.33>>> 23/1.51 -IVF now stopped initially however due to hypernatremia we will place on gentle hydration for the next 24 hours. -Avoid further nephrotoxic medications, contrast dyes, hypotension renally dose medications -Repeat bmet in a.m.         DVT prophylaxis: Lovenox Code Status: DNR Family Communication: Updated patient and wife, [name] at bedside. Disposition: Likely home with home health, when clinically improved.  Status is: Inpatient Remains inpatient appropriate because: Severity of illness           Consultants:  Urology: Dr. Tresa Moore 08/18/2021 IR: Dr.Mir 08/22/2021 ENT Dr. Janace Hoard 08/16/2021  Procedures:  Modified barium swallow 08/23/2021 CT soft tissue neck 08/16/2021 CT angiogram chest 08/15/2021 Successful reinsertion of a 14 French right nephrostomy drain per IR Dr.Mir 08/22/2021  Antimicrobials:  IV Unasyn 08/17/2021>>>> IV Unasyn 08/15/2021 x 1 dose IV azithromycin 08/16/2021>>>> 08/21/2021 IV Rocephin 08/16/2021>>> 08/17/2021   Subjective: Sitting up in bed.  Denies any chest pain.  Voice is a whisper.  No shortness of breath.  No abdominal pain.  Awaiting MBS to be done today.  Objective: Vitals:   08/23/21 1530 08/23/21 1746 08/23/21 1933 08/23/21 1944  BP:  (!) 158/85    Pulse:  76  81  Resp:  13  13  Temp:  99 F (37.2 C)    TempSrc:  Oral    SpO2: 98% 98% 96% 96%  Weight:      Height:        Intake/Output Summary (Last 24 hours) at 08/23/2021 1955 Last data filed at 08/23/2021 1543 Gross per 24 hour  Intake 1074.21 ml  Output 8923 ml  Net -7848.79 ml   Filed Weights   08/20/21 0350 08/21/21 0458 08/23/21 0355  Weight: 93.8 kg 93 kg 93.8 kg    Examination:  General exam: Appears calm and comfortable   Respiratory system: Clear to auscultation.  No wheezes, no crackles, no rhonchi.  Fair air movement.  Speaking in full sentences.  Respiratory effort normal. Cardiovascular system: S1 & S2 heard, RRR. No JVD, murmurs, rubs, gallops or clicks. No pedal edema. Gastrointestinal system: Abdomen is nondistended, soft and nontender. No organomegaly or masses felt. Normal bowel sounds heard. Central nervous system: Alert and oriented. No focal neurological deficits. Extremities: Symmetric 5 x 5 power. Skin: No rashes, lesions or ulcers Psychiatry: Judgement and insight appear normal. Mood & affect appropriate.     Data Reviewed:   CBC: Recent Labs  Lab 08/18/21 0550 08/19/21 0420 08/20/21 0234 08/21/21 0356 08/23/21 0526  WBC 8.9 8.7 6.8 7.7 7.8  NEUTROABS 7.1 6.1 4.6 5.1 5.4  HGB 12.6* 14.2 13.2 13.5 12.8*  HCT 39.3 44.9 40.1 43.1 39.6  MCV 87.7 87.5 85.7 88.1 87.0  PLT 181 225 207 237 [id number]    Basic Metabolic Panel: Recent Labs  Lab 08/18/21 1631 08/19/21 0420 08/20/21 0234 08/21/21 0356 08/22/21 0224 08/23/21 0526  NA  --  144 144 144 145 147*  K  --  3.6 3.4* 4.1 3.7 3.7  CL  --  107 106 107 109 108  CO2  --  27 30 27 27 31   GLUCOSE  --  117* 124* 103* 144* 150*  BUN  --  12 16 19 22 23   CREATININE  --  1.20 1.15 1.26* 1.33* 1.51*  CALCIUM  --  9.7 9.4 9.5 9.6 9.5  MG 2.2 2.2 2.0 2.3  --  2.6*  PHOS 2.8 2.9 3.6 4.5 4.0 4.5    GFR: Estimated Creatinine Clearance: 47.7 mL/min (A) (by C-G formula based on SCr of 1.51 mg/dL (H)).  Liver Function Tests: Recent Labs  Lab 08/19/21 0420 08/20/21 0234 08/21/21 0356 08/22/21 0224 08/23/21 0526  AST 18 16 17 17 16   ALT 17 19 20 20 18   ALKPHOS 55 47 50 45 49  BILITOT 0.8 0.3 0.5 0.4 0.4  PROT 6.9 6.5 6.5 6.7 6.3*  ALBUMIN 3.1* 2.9* 2.9* 3.1* 2.9*    CBG: Recent Labs  Lab 08/22/21 1618 08/22/21 2054 08/23/21 0816 08/23/21 1217 08/23/21 1743  GLUCAP 131* 149* 133* 102* 80     Recent Results (from the  past 240 hour(s))  Resp Panel by RT-PCR (Flu A&B, Covid) Nasopharyngeal Swab     Status: None   Collection Time: 08/15/21  5:12 PM  Specimen: Nasopharyngeal Swab; Nasopharyngeal(NP) swabs in vial transport medium  Result Value Ref Range Status   SARS Coronavirus 2 by RT PCR NEGATIVE NEGATIVE Final    Comment: (NOTE) SARS-CoV-2 target nucleic acids are NOT DETECTED.  The SARS-CoV-2 RNA is generally detectable in upper respiratory specimens during the acute phase of infection. The lowest concentration of SARS-CoV-2 viral copies this assay can detect is 138 copies/mL. A negative result does not preclude SARS-Cov-2 infection and should not be used as the sole basis for treatment or other patient management decisions. A negative result may occur with  improper specimen collection/handling, submission of specimen other than nasopharyngeal swab, presence of viral mutation(s) within the areas targeted by this assay, and inadequate number of viral copies(<138 copies/mL). A negative result must be combined with clinical observations, patient history, and epidemiological information. The expected result is Negative.  Fact Sheet for Patients:  EntrepreneurPulse.com.au  Fact Sheet for Healthcare Providers:  IncredibleEmployment.be  This test is no t yet approved or cleared by the [place] FDA and  has been authorized for detection and/or diagnosis of SARS-CoV-2 by FDA under an Emergency Use Authorization (EUA). This EUA will remain  in effect (meaning this test can be used) for the duration of the COVID-19 declaration under Section 564(b)(1) of the Act, 21 U.S.C.section 360bbb-3(b)(1), unless the authorization is terminated  or revoked sooner.       Influenza A by PCR NEGATIVE NEGATIVE Final   Influenza B by PCR NEGATIVE NEGATIVE Final    Comment: (NOTE) The Xpert Xpress SARS-CoV-2/FLU/RSV plus assay is intended as an aid in the diagnosis of  influenza from Nasopharyngeal swab specimens and should not be used as a sole basis for treatment. Nasal washings and aspirates are unacceptable for Xpert Xpress SARS-CoV-2/FLU/RSV testing.  Fact Sheet for Patients: EntrepreneurPulse.com.au  Fact Sheet for Healthcare Providers: IncredibleEmployment.be  This test is not yet approved or cleared by the [place] FDA and has been authorized for detection and/or diagnosis of SARS-CoV-2 by FDA under an Emergency Use Authorization (EUA). This EUA will remain in effect (meaning this test can be used) for the duration of the COVID-19 declaration under Section 564(b)(1) of the Act, 21 U.S.C. section 360bbb-3(b)(1), unless the authorization is terminated or revoked.  Performed at [facility], [address], [place], [place] [address]   Culture, blood (routine x 2)     Status: None   Collection Time: 08/16/21  5:03 PM   Specimen: BLOOD  Result Value Ref Range Status   Specimen Description BLOOD BLOOD LEFT HAND  Final   Special Requests   Final    BOTTLES DRAWN AEROBIC AND ANAEROBIC Blood Culture adequate volume   Culture   Final    NO GROWTH 5 DAYS Performed at [facility], [place] [address] [place], [place] [address]    Report Status 08/21/2021 FINAL  Final  Culture, blood (routine x 2)     Status: None   Collection Time: 08/16/21  5:11 PM   Specimen: BLOOD  Result Value Ref Range Status   Specimen Description BLOOD BLOOD RIGHT HAND  Final   Special Requests   Final    BOTTLES DRAWN AEROBIC AND ANAEROBIC Blood Culture adequate volume   Culture   Final    NO GROWTH 5 DAYS Performed at [facility], [place] [address] [place], [place] [address]    Report Status 08/21/2021 FINAL  Final         Radiology Studies: DG CHEST PORT  1 VIEW  Result Date: 08/22/2021 CLINICAL DATA:  Shortness of breath EXAM: PORTABLE CHEST 1 VIEW COMPARISON:  Chest x-ray dated  August 21, 2021 obtained at 06:47 FINDINGS: Cardiac and mediastinal contours are unchanged. Improved aeration of the lung bases, likely due to decreased atelectasis. No large pleural effusion or evidence of pneumothorax. Enteric feeding tube partially seen coursing below the diaphragm. IMPRESSION: Compared aeration of the lung bases, likely due to decreased atelectasis. Electronically Signed   By: Allegra Lai M.D.   On: 08/22/2021 08:06   DG Swallowing Func-Speech Pathology  Result Date: 08/23/2021 Table formatting from the original result was not included. Objective Swallowing Evaluation: Type of Study: MBS-Modified Barium Swallow Study  Patient Details Name: Elija Howorth MRN: [id number] Date of Birth: Jul 31, 1945 Today's Date: 08/23/2021 Time: SLP Start Time (ACUTE ONLY): 1305 -SLP Stop Time (ACUTE ONLY): 1330 SLP Time Calculation (min) (ACUTE ONLY): 25 min Past Medical History: Past Medical History: Diagnosis Date  CKD (chronic kidney disease) stage 3, GFR 30-59 ml/min (HCC)   Hypertension   Kidney stones   Sleep apnea  Past Surgical History: Past Surgical History: Procedure Laterality Date  CATARACT EXTRACTION    IR NEPHROSTOMY EXCHANGE RIGHT  06/07/2021  IR NEPHROSTOMY EXCHANGE RIGHT  08/22/2021  IR NEPHROSTOMY PLACEMENT RIGHT  04/07/2021  IR PATIENT EVAL TECH 0-60 MINS  06/02/2021  kidney stent    MANDIBLE FRACTURE SURGERY   HPI: Pt is a 76 yo male adm to [facility] after concern for aspiration event with pill.  Pt CT chest showed "Interval finding of mildly nodular and ground-glass airspace  disease in the right lung base with debris and or fluid within the  bronchi, given clear lung bases on the CT performed earlier today,  suspect that findings could be secondary to aspiration."  Swallow eval ordered. 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  Pt reports he tried to swallow pill dry and this resulted in aspiration.  Per CT neck, pt has Submucosal edema in the supraglottic larynx.  Pt saw ENT during hospital coarse but declined to be scoped.  He has been nutritionally supported via Cortrak Tube feeding.  Subjective: pleasant, cooperative  Recommendations for follow up therapy are one component of a multi-disciplinary discharge planning process, led by the attending physician.  Recommendations may be updated based on patient status, additional functional criteria and insurance authorization. Assessment / Plan / Recommendation Clinical Impressions 08/23/2021 Clinical Impression Patient demonstrating a significantly improved swallow function as compared to MBS on 2/22. He exhibited mildly delayed oral transit of puree, nectar and thin liquid consistencies and mildly impaired mastication of dysphagia 2 solids. During pharyngeal phase, patient exhibited swallow initiation delay at level of vallecular sinus with nectar thick liquids and teaspoon sip thin liquids and swallow initiation delay at level of pyriform sinus with thin liquids via cup and straw sips. When taking larger cup sip and straw sip, patient with trace flash penetration of thin liquids which was well above vocal cords and fully cleared. Barium tablet taken with puree solids transited pharyngeally without difficulty and esophageal sweep did not reveal any s/s dysmotility or stasis. SLP is recommending to initiate Dys 2 solids, thin liquids diet at this time and will follow for diet toleration and ability to trial upgraded solids. SLP Visit Diagnosis Dysphagia, oropharyngeal phase (R13.12) Attention  and concentration deficit following -- Frontal lobe and executive function deficit following -- Impact on safety and function Mild aspiration risk   Treatment Recommendations 08/23/2021 Treatment Recommendations Therapy as outlined in  treatment plan below   Prognosis 08/23/2021 Prognosis for Safe Diet Advancement Good Barriers to Reach Goals -- Barriers/Prognosis Comment -- Diet Recommendations 08/23/2021 SLP Diet Recommendations Dysphagia 2 (Fine chop) solids;Thin liquid Liquid Administration via Cup;Straw Medication Administration Whole meds with puree Compensations Minimize environmental distractions;Slow rate;Small sips/bites;Other (Comment) Postural Changes --   Other Recommendations 08/23/2021 Recommended Consults -- Oral Care Recommendations Oral care BID Other Recommendations -- Follow Up Recommendations Home health SLP Assistance recommended at discharge Frequent or constant Supervision/Assistance Functional Status Assessment Patient has had a recent decline in their functional status and demonstrates the ability to make significant improvements in function in a reasonable and predictable amount of time. Frequency and Duration  08/23/2021 Speech Therapy Frequency (ACUTE ONLY) min 2x/week Treatment Duration 1 week   Oral Phase 08/23/2021 Oral Phase Impaired Oral - Pudding Teaspoon -- Oral - Pudding Cup -- Oral - Honey Teaspoon -- Oral - Honey Cup -- Oral - Nectar Teaspoon NT Oral - Nectar Cup Delayed oral transit Oral - Nectar Straw -- Oral - Thin Teaspoon Delayed oral transit Oral - Thin Cup Delayed oral transit;Holding of bolus Oral - Thin Straw Delayed oral transit;Holding of bolus Oral - Puree Delayed oral transit Oral - Mech Soft Delayed oral transit;Impaired mastication Oral - Regular -- Oral - Multi-Consistency -- Oral - Pill Delayed oral transit;Reduced posterior propulsion Oral Phase - Comment --  Pharyngeal Phase 08/23/2021 Pharyngeal Phase Impaired Pharyngeal- Pudding Teaspoon -- Pharyngeal -- Pharyngeal- Pudding Cup -- Pharyngeal -- Pharyngeal- Honey Teaspoon -- Pharyngeal -- Pharyngeal- Honey Cup -- Pharyngeal -- Pharyngeal- Nectar Teaspoon NT Pharyngeal -- Pharyngeal- Nectar Cup Delayed swallow initiation-vallecula Pharyngeal --  Pharyngeal- Nectar Straw -- Pharyngeal -- Pharyngeal- Thin Teaspoon Delayed swallow initiation-vallecula Pharyngeal Material does not enter airway Pharyngeal- Thin Cup Delayed swallow initiation-pyriform sinuses;Penetration/Aspiration during swallow Pharyngeal Material enters airway, remains ABOVE vocal cords then ejected out Pharyngeal- Thin Straw Delayed swallow initiation-pyriform sinuses;Penetration/Aspiration during swallow Pharyngeal Material enters airway, remains ABOVE vocal cords then ejected out Pharyngeal- Puree WFL Pharyngeal Material does not enter airway Pharyngeal- Mechanical Soft Delayed swallow initiation-vallecula Pharyngeal -- Pharyngeal- Regular -- Pharyngeal -- Pharyngeal- Multi-consistency -- Pharyngeal -- Pharyngeal- Pill WFL Pharyngeal -- Pharyngeal Comment --  Cervical Esophageal Phase  08/23/2021 Cervical Esophageal Phase WFL Pudding Teaspoon -- Pudding Cup -- Honey Teaspoon -- Honey Cup -- Nectar Teaspoon -- Nectar Cup -- Nectar Straw -- Thin Teaspoon -- Thin Cup -- Thin Straw -- Puree -- Mechanical Soft -- Regular -- Multi-consistency -- Pill -- Cervical Esophageal Comment -- Sonia Baller, MA, CCC-SLP Speech Therapy                     IR NEPHROSTOMY EXCHANGE RIGHT  Result Date: 08/22/2021 INDICATION: 76 year old gentleman with chronic indwelling right nephrostomy drain returns with dislodged drain. EXAM: Fluoroscopy guided re-insertion of right percutaneous nephrostomy drain. COMPARISON:  None. MEDICATIONS: None ANESTHESIA/SEDATION: None CONTRAST:  5 mL of Omnipaque 300-administered into the collecting system(s) FLUOROSCOPY TIME:  Fluoroscopy Time: 0.9 minutes (28 mGy). COMPLICATIONS: None immediate. PROCEDURE: Informed written consent was obtained from the patient after a thorough discussion of the procedural risks, benefits and alternatives. All questions were addressed. Maximal Sterile Barrier Technique was utilized including caps, mask, sterile gowns, sterile gloves, sterile  drape, hand hygiene and skin antiseptic. A timeout was performed prior  to the initiation of the procedure. The existing right nephrostomy tract was access with a Kumpe catheter. Kumpe catheter advanced into the right renal collecting system. Contrast administered through the Kumpe catheter under fluoroscopy confirmed appropriate positioning. Kumpe catheter exchanged for 14 French multipurpose pigtail drain over 0.035 inch guidewire. Final position confirmed by administering contrast through the drain. Drain secured to skin with suture and connected to bag. IMPRESSION: Successful reinsertion of 14 French right nephrostomy drain. PLAN: Return in 8 weeks for routine exchange. Electronically Signed   By: Miachel Roux M.D.   On: 08/22/2021 15:30        Scheduled Meds:  budesonide (PULMICORT) nebulizer solution  0.25 mg Nebulization BID   enoxaparin (LOVENOX) injection  40 mg Subcutaneous Q24H   ipratropium  0.5 mg Nebulization TID   levalbuterol  1.25 mg Nebulization TID   metoprolol tartrate  5 mg Intravenous Q8H   Continuous Infusions:  ampicillin-sulbactam (UNASYN) IV 3 g (08/23/21 1505)   dextrose 75 mL/hr at 08/23/21 1547     LOS: 8 days    Time spent: 40 minutes    Irine Seal, MD [facility]   To contact the attending provider between 7A-7P or the covering provider during after hours 7P-7A, please log into the web site [email or web address] and access using universal [place] password for that web site. If you do not have the password, please call the hospital operator.  08/23/2021, 7:55 PM

## 2021-08-23 NOTE — Assessment & Plan Note (Addendum)
-   Resolved on D5W.  ?

## 2021-08-24 LAB — CBC
HCT: 37.8 % — ABNORMAL LOW (ref 39.0–52.0)
Hemoglobin: 12.2 g/dL — ABNORMAL LOW (ref 13.0–17.0)
MCH: 28.4 pg (ref 26.0–34.0)
MCHC: 32.3 g/dL (ref 30.0–36.0)
MCV: 87.9 fL (ref 80.0–100.0)
Platelets: 231 10*3/uL (ref 150–400)
RBC: 4.3 MIL/uL (ref 4.22–5.81)
RDW: 14.8 % (ref 11.5–15.5)
WBC: 8.3 10*3/uL (ref 4.0–10.5)
nRBC: 0 % (ref 0.0–0.2)

## 2021-08-24 LAB — BASIC METABOLIC PANEL
Anion gap: 11 (ref 5–15)
BUN: 26 mg/dL — ABNORMAL HIGH (ref 8–23)
CO2: 27 mmol/L (ref 22–32)
Calcium: 9.2 mg/dL (ref 8.9–10.3)
Chloride: 102 mmol/L (ref 98–111)
Creatinine, Ser: 1.57 mg/dL — ABNORMAL HIGH (ref 0.61–1.24)
GFR, Estimated: 46 mL/min — ABNORMAL LOW (ref >60)
Glucose, Bld: 94 mg/dL (ref 70–99)
Potassium: 3.6 mmol/L (ref 3.5–5.1)
Sodium: 140 mmol/L (ref 135–145)

## 2021-08-24 LAB — MAGNESIUM: Magnesium: 2.4 mg/dL (ref 1.7–2.4)

## 2021-08-24 LAB — GLUCOSE, CAPILLARY
Glucose-Capillary: 121 mg/dL — ABNORMAL HIGH (ref 70–99)
Glucose-Capillary: 143 mg/dL — ABNORMAL HIGH (ref 70–99)

## 2021-08-24 LAB — SEDIMENTATION RATE: Sed Rate: 70 mm/hr — ABNORMAL HIGH (ref 0–16)

## 2021-08-24 MED ORDER — SODIUM CHLORIDE 0.45 % IV SOLN: INTRAVENOUS | Status: DC

## 2021-08-24 MED ORDER — MELATONIN 5 MG PO TABS: 10.0000 mg | ORAL_TABLET | Freq: Every evening | ORAL | Status: DC | PRN

## 2021-08-24 MED ORDER — AMLODIPINE BESYLATE 5 MG PO TABS
5.0000 mg | ORAL_TABLET | Freq: Every day | ORAL | Status: DC
  Administered 2021-08-24 – 2021-08-25 (×2): 5 mg via ORAL
  Filled 2021-08-24 (×2): qty 1

## 2021-08-24 NOTE — Progress Notes (Signed)
Speech Language Pathology Treatment: Dysphagia  ?Patient Details ?Name: Nicholas Mckenzie ?MRN: [id number] ?DOB: [birth year] ?Today's Date: 08/24/2021 ?Time: 4196-2229 ?SLP Time Calculation (min) (ACUTE ONLY): 44 min ? ?Assessment / Plan / Recommendation ?Clinical Impression ? Pt seen for skilled SLP for dysphagia management.  Pt's voice remains hoarse- baseline = improved after awake for a while as was just a whisper after woke.  Pt asleep upon arrival but agreed to wake and removed his Cpap.  Upon sitting upright in bed - pt unable to slide posterior.  SlP called for help and had pt brush his teeth in the interim. As soon as help arrived pt stated he needed to have bowel movment.   SLP returned at a later time and pt had just finished breakfast   - consuming everything.  SLP conducted 3 ounce Yale  with pt with him easily passing.   He denies any coughing with po intake nor dysphagia or increased work of breathing.   ? ?Reviewed study with pt and indication for dry swallow with liquids to help clear pharyngeal retention that he did not sense on MBS 08/23/2021.  RN Engineer, building services arrived and attempted to give pt medication with puree - SLP introduced them to the swallow precaution sign and reviewed level of compromise when pt aspirated a pill PTA.  Provided them with applesauce and pt took pill without diffiuclty.  SLP then reviewed with pt in detail importance of his precautions using teach back.  He will benefit from intermittent supervision to assure tolerating po and following precautions.   ? ?Pt inquired re: need for oral suction after dc- to which SLP advised against. Pt is able to cough/propel secretions in oral cavity and orally clear independently - Much improved since early in hospital coarse. Pt agreeable that oral suction not needed.   ? 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  He is likely back  at his baseline level of swallow due to his decreased edema from time.  Continue dys2/thin for now - as pt admits "I can't remember" re: pill administration, etc.  Anticipate he will not require SLP follow up as OP but will follow him acutely in the hospital.  Thanks! ? ?  ?HPI HPI: Pt is a 76 yo male adm to [facility] after concern for aspiration event with pill.  Pt CT chest showed "Interval finding of mildly nodular and ground-glass airspace  disease in the right lung base with debris and or fluid within the  bronchi, given clear lung bases on the CT performed earlier today,  suspect that findings could be secondary to aspiration."  Swallow eval ordered. 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  Pt reports he tried to swallow pill dry and this resulted in aspiration.  Per CT neck, pt has Submucosal edema in the supraglottic larynx.  Pt saw ENT during hospital coarse but declined to be scoped.  He has been nutritionally supported via Cortrak Tube feeding. ?  ?   ?SLP Plan ? Continue with current plan of care ? ?  ?  ?Recommendations for follow up therapy are one component of a multi-disciplinary discharge planning process, led by the attending physician.  Recommendations may be updated based on  patient status, additional functional criteria and insurance authorization. ?  ? ?Recommendations  ?Diet recommendations: Dysphagia 2 (fine chop);Thin liquid ?Liquids provided via: Cup ?Medication Administration: Whole meds with puree ?Supervision: Patient able to self feed ?Compensations: Minimize environmental distractions;Slow rate;Small sips/bites;Other (Comment) ?Postural Changes and/or Swallow Maneuvers: Seated upright 90 degrees  ?   ?    ?   ? ? ? ? Oral Care Recommendations: Oral care  QID ?Follow Up Recommendations: No SLP follow up ?Assistance recommended at discharge: Frequent or constant Supervision/Assistance ?SLP Visit Diagnosis: Dysphagia, oropharyngeal phase (R13.12) ?Plan: Continue with current plan of care ? ? ? ? ?  ?  ? ? ?Chales Abrahams ?Rolena Infante, MS CCC SLP ?Acute Rehab Services ?Office [phone] ?Pager [phone] ? ?08/24/2021, 10:47 AM ?

## 2021-08-24 NOTE — Progress Notes (Signed)
Physical Therapy Treatment ?Patient Details ?Name: Nicholas Mckenzie ?MRN: [id number] ?DOB: 08-19-1945 ?Today's Date: 08/24/2021 ? ? ?History of Present Illness 76 y.o. male admitted 2/21 with SOB and tachycardia with possibly choking on pill. Pt with acute respiratory failure. PMhx. 06/06/2021 fall with SDH, CKD stage IIIa, nephrostomy tube, sleep apnea, HTN, OSA, depression/anxiety, CVA with residual left hemiparesis. ? ?  ?PT Comments  ? ? Pt pleasant and able to maintain sats 90-94% on RA throughout session. Pt with imrpoved gait tolerance and continue to need assist and cues for balance and safety. ?   ?Recommendations for follow up therapy are one component of a multi-disciplinary discharge planning process, led by the attending physician.  Recommendations may be updated based on patient status, additional functional criteria and insurance authorization. ? ?Follow Up Recommendations ? Home health PT ?  ?  ?Assistance Recommended at Discharge Intermittent Supervision/Assistance  ?Patient can return home with the following A little help with bathing/dressing/bathroom;A little help with walking and/or transfers;Assist for transportation;Direct supervision/assist for financial management;Direct supervision/assist for medications management ?  ?Equipment Recommendations ? None recommended by PT  ?  ?Recommendations for Other Services   ? ? ?  ?Precautions / Restrictions Precautions ?Precautions: Fall ?Precaution Comments: Rt nephrostomy tube ?Restrictions ?Weight Bearing Restrictions: No  ?  ? ?Mobility ? Bed Mobility ?Overal bed mobility: Needs Assistance ?Bed Mobility: Supine to Sit ?  ?  ?Supine to sit: Min guard ?  ?  ?General bed mobility comments: HOB flat with rail and guarding with tactile cues to fully elevate trunk from surface ?  ? ?Transfers ?Overall transfer level: Needs assistance ?  ?Transfers: Sit to/from Stand ?Sit to Stand: Min guard ?  ?  ?  ?  ?  ?General transfer comment: cues for hand placement and  positioning at surface ?  ? ?Ambulation/Gait ?Ambulation/Gait assistance: Min guard ?Gait Distance (Feet): 160 Feet ?Assistive device: Rolling walker (2 wheels) ?Gait Pattern/deviations: Step-through pattern, Decreased stride length, Trunk flexed ?  ?Gait velocity interpretation: 1.31 - 2.62 ft/sec, indicative of limited community ambulator ?  ?General Gait Details: cues for direction to avoid obstacles, cues for proximity to RW and posture ? ? ?Stairs ?  ?  ?  ?  ?  ? ? ?Wheelchair Mobility ?  ? ?Modified Rankin (Stroke Patients Only) ?  ? ? ?  ?Balance Overall balance assessment: Needs assistance ?Sitting-balance support: No upper extremity supported, Feet supported ?Sitting balance-Leahy Scale: Good ?  ?  ?Standing balance support: Bilateral upper extremity supported ?Standing balance-Leahy Scale: Poor ?Standing balance comment: RW for static standing and gait ?  ?  ?  ?  ?  ?  ?  ?  ?  ?  ?  ?  ? ?  ?Cognition Arousal/Alertness: Awake/alert ?Behavior During Therapy: [facility] for tasks assessed/performed ?Overall Cognitive Status: Impaired/Different from baseline ?Area of Impairment: Safety/judgement ?  ?  ?  ?  ?  ?  ?  ?  ?  ?  ?  ?  ?Safety/Judgement: Decreased awareness of deficits ?  ?  ?General Comments: pt needs cues to attend to running into objects with gait ?  ?  ? ?  ?Exercises General Exercises - Lower Extremity ?Long Arc Quad: AROM, Both, Seated, 20 reps ?Hip Flexion/Marching: AROM, Both, Seated, 20 reps ? ?  ?General Comments   ?  ?  ? ?Pertinent Vitals/Pain Pain Assessment ?Pain Assessment: No/denies pain  ? ? ?Home Living   ?  ?  ?  ?  ?  ?  ?  ?  ?  ?   ?  ?  Prior Function    ?  ?  ?   ? ?PT Goals (current goals can now be found in the care plan section) Progress towards PT goals: Progressing toward goals ? ?  ?Frequency ? ? ? Min 3X/week ? ? ? ?  ?PT Plan Current plan remains appropriate  ? ? ?Co-evaluation   ?  ?  ?  ?  ? ?  ?AM-PAC PT "6 Clicks" Mobility   ?Outcome Measure ? Help needed turning from  your back to your side while in a flat bed without using bedrails?: None ?Help needed moving from lying on your back to sitting on the side of a flat bed without using bedrails?: A Little ?Help needed moving to and from a bed to a chair (including a wheelchair)?: A Little ?Help needed standing up from a chair using your arms (e.g., wheelchair or bedside chair)?: A Little ?Help needed to walk in hospital room?: A Little ?Help needed climbing 3-5 steps with a railing? : A Lot ?6 Click Score: 18 ? ?  ?End of Session   ?Activity Tolerance: Patient tolerated treatment well ?Patient left: in chair;with call bell/phone within reach;with nursing/sitter in room;with chair alarm set ?Nurse Communication: Mobility status ?PT Visit Diagnosis: Other abnormalities of gait and mobility (R26.89);Difficulty in walking, not elsewhere classified (R26.2);Muscle weakness (generalized) (M62.81) ?  ? ? ?Time: [id number] ?PT Time Calculation (min) (ACUTE ONLY): 25 min ? ?Charges:  $Gait Training: 8-22 mins ?$Therapeutic Exercise: 8-22 mins          ?          ? ?Norwood Quezada P, PT ?Acute Rehabilitation Services ?Pager: [phone] ?Office: [phone] ? ? ? ?[name] ?08/24/2021, 9:48 AM ? ?

## 2021-08-24 NOTE — Progress Notes (Signed)
Attempt x3 made to give morning breathing treatments, pt unavailable each attempt. RRT will resume at next scheduled treatment time. ?

## 2021-08-24 NOTE — Progress Notes (Signed)
Nutrition Follow-up ? ?DOCUMENTATION CODES:  ?Non-severe (moderate) malnutrition in context of social or environmental circumstances ? ?INTERVENTION:  ?Continue current diet as ordered, encourage PO intake ?Mighty Shake BID to provide 330kcal and 9g of protein per serving. ? ?NUTRITION DIAGNOSIS:  ?Moderate Malnutrition (in the context of social/environmental circumstances) related to dysphagia as evidenced by mild muscle depletion, mild fat depletion, percent weight loss (10.3% x 6 months). ?- remains applicable ? ?GOAL:  ?Patient will meet greater than or equal to 90% of their needs ?- diet advanced, supplements in place ? ?MONITOR:  ?TF tolerance, Labs ? ?REASON FOR ASSESSMENT:  ?Other (Comment) (new cortrack placement) ?  ? ?ASSESSMENT:  ?76 y.o. male with history of HTN, CKD3, and sleep apnea presented to ED with SOB worsening throughout the day. In ED, noted to be hypertensive, tachycardic and hypoxic. Attributed this to aspiration event earlier in the day. ? ?2/22 - Cortrak placed ?3/1 - MBS, DYS2 initiated, cortrak removed ? ?Pt resting in bedside chair at the time of assessment. Repeat MBS was performed yesterday and cortrak tube was removed. Pt able to be advanced to [employer]. Pt reports that he ate well at breakfast this AM, does not feel as if he is having trouble chewing or swallowing his food now.  ? ?Discussed a nutrition supplement with pt to augment intake, pt reports he does not like ensure or boost. Is agreeable to [facility]. Will add BID to tray.  ? ?Nutritionally Relevant Medications: ?Continuous Infusions: ? ampicillin-sulbactam (UNASYN) IV 3 g (08/24/21 0438)  ? ?PRN Meds: ondansetron ? ?Labs Reviewed: ?Creatinine 1.57, BUN 26 ?SBG ranges from 80-133 mg/dL over the last 24 hours  ? ?NUTRITION - FOCUSED PHYSICAL EXAM: ?Flowsheet Row Most Recent Value  ?Orbital Region Mild depletion  ?Upper Arm Region Mild depletion  ?Thoracic and Lumbar Region Mild depletion  ?Buccal Region Mild depletion   ?Temple Region No depletion  ?Clavicle Bone Region Mild depletion  ?Clavicle and Acromion Bone Region No depletion  ?Scapular Bone Region No depletion  ?Patellar Region Mild depletion  ?Anterior Thigh Region Mild depletion  ?Posterior Calf Region Mild depletion  ?Edema (RD Assessment) None  ?Hair Reviewed  ?Eyes Reviewed  ?Mouth Reviewed  ?Skin Reviewed  ?Nails Reviewed  ? ?Diet Order:   ?Diet Order   ? ?       ?  DIET DYS 2 Room service appropriate? Yes; Fluid consistency: Thin  Diet effective now       ?  ? ?  ?  ? ?  ? ? ?EDUCATION NEEDS:  ?Education needs have been addressed ? ?Skin:  Skin Assessment: Reviewed RN Assessment ? ?Last BM:  3/1 ? ?Height:  ?Ht Readings from Last 1 Encounters:  ?08/16/21 5\' 8"  (1.727 m)  ? ? ?Weight:  ?Wt Readings from Last 1 Encounters:  ?08/24/21 94 kg  ? ? ?Ideal Body Weight:  70 kg ? ?BMI:  Body mass index is 31.51 kg/m?. ? ?Estimated Nutritional Needs:  ?Kcal:  2000-2200 kcal/d ?Protein:  100-115 g/d ?Fluid:  >2 L/d ? ? ?10/24/21, RD, LDN ?Clinical Dietitian ?RD pager # available in AMION  ?After hours/weekend pager # available in AMION ?

## 2021-08-24 NOTE — Progress Notes (Signed)
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  He was n.p.o. prior to the scan but decided to take his Prozac pill before going to scan did not use water and he felt like he choked on the pill.  He started having some mild trouble breathing and elevated blood pressure but decided to proceed with a CT scan anyway and then went home and then took his Norvasc and drink some juice.  He started having more trouble throughout the day with his breathing and I brought him to the ED and he was noted to be hypertensive, tachycardic and hypoxic.  Subsequently the patient had likely aspirated his pills and had to be placed on supplemental oxygen via nasal cannula and improved.  He had to be placed on 13 L of supplemental oxygen and when he came to the ED, he underwent a CT scan of the chest which showed aspiration findings.  He was admitted to the stepdown progressive unit and admitted for his hypoxia after aspiration.  Patient became extremely short of breath and began having a persistent cough and patient's wife has noticed that his voice is become more of a whisper.  Patient also complained of sore throat.  In the ED he was hypoxic requiring at least 13 L of supplemental oxygen which was then weaned to 8 L.  CTA of the chest was negative for PE but did show  findings concerning for aspiration.  His labs did show leukocytosis and patient was febrile with a temperature of 102 and a COVID test was negative.  He was initially on empiric antibiotics and admitted for further work-up and admitted for sepsis secondary to aspiration pneumonia.  **Interim History ENT was consulted for the edema of the supraglottis and they recommended initially evaluating with a scope but patient did not want to do this so they are holding off for now.  Given slightly worsening pulmonary status overnight ENT and no new recommendations recommended continue n.p.o. for now.  Overnight the patient appeared more short of breath and had a change in consistency and inquiry of his sputum.  He is no longer bringing up large amounts of thick copious green sputum and his chest x-ray revealed substantial increase in his right basilar opacity compared to his prior chest x-ray.  His antibiotics were changed to Unasyn and his tube feedings were discontinued given that there is concern for ongoing aspiration due to malfunctioning tube feeding leading to worsening aspiration.  His tube feeds were resumed today after his Cortrak was unclogged.  He continues to be intermittently on and off oxygen But his respiratory status is relatively stable  He has been intermittently somnolent he day before he was more somnolent as he did not have a good night's rest. 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 SLP continues to recommend NPO.   Urology was consulted and patient had Neph tube 06/07/21. His Right Neph Tube was accidentally pulled overnight so we will consult IR for replacement.  Will continue to monitor renal function as it is slowly trending upwards and is now 22/1.33  Continue to monitor his respiratory status carefully and repeat chest x-ray this morning shows some interval  improvement.  Will obtain repeat SLP evaluation to assess his swallowing and the plan is to pull a core track in the morning and do an MBS and then replace if necessary.    Assessment & Plan:  Principal Problem:   Acute respiratory failure with hypoxia (HCC) Active Problems:   CKD (chronic kidney disease) stage 3a   Hypertension   Hypokalemia   H/O ischemic right PCA stroke   Aspiration pneumonia (HCC)   Severe sepsis (HCC)   Sore throat   Hx of subdural hematoma   Nephrolithiasis   Stage 3a chronic kidney disease (CKD) (HCC)   Obesity (BMI 30-39.9)   OSA (obstructive sleep apnea)   Malnutrition of moderate degree   Hypernatremia    Assessment and Plan: * Acute respiratory failure with hypoxia (HCC) -In the setting of aspiration event and aspiration pneumonia -Patient aspirated on his Protonix pill and became extremely short of breath going on for a CT scan for his last urology evaluation. -Initiated on empiric antibiotics with IV ceftriaxone and azithromycin which was subsequently changed to IV Unasyn given worsening respiratory status. -Continue treatment as below delineated in sepsis and aspiration pneumonia -SLP evaluated and recommending n.p.o. except ice chips early on the hospitalization and contract placed. -Coretrak removed and patient followed by speech therapy; -Patient subsequently underwent modified barium swallow today with improvement with swallowing and started on a dysphagia 2 diet. -Sats of 94% on room air. -Continuous pulse oximetry and maintain O2 saturations greater than 90% -Continue supplemental oxygen via nasal cannula wean O2 as tolerated -Patient's voice has become more whisper so a CT scan of the neck soft tissue was done and showed "Submucosal edema in the supraglottic larynx. -ENT consulted about CT Soft Tissue Neck Findings and Dr. Janace Hoard will come assess the patient and he had recommended scope and the patient the patient refused at this time and  will continue monitor patient's respiratory status carefully and then consider scoping in a few days but now recommending outpatient follow up.  -ENT feels that he would benefit from a vocal cord augmentation once he recovers -Repeat CXR intermittently     Hypernatremia - Resolved on D5W.  -Change IV fluids to half-normal saline.  -Repeat labs in the morning.   Malnutrition of moderate degree Nutrition Status: Nutrition Problem: Moderate Malnutrition (in the context of social/environmental circumstances) Etiology: dysphagia Signs/Symptoms: mild muscle depletion, mild fat depletion, percent weight loss (10.3% x 6 months) Percent weight loss: 10.3 % Interventions: Refer to RD note for recommendations -Nutritionist Consulted and appreciate Recc's -Patient seen by speech therapy, underwent MBS, currently tolerating a dysphagia 2 diet.     OSA (obstructive sleep apnea) -CPAP nightly    Obesity (BMI 0000000) -Complicates overall prognosis and care -Estimated body mass index is 31.38 kg/m as calculated from the following:   Height as of this encounter: 5\' 8"  (1.727 m).   Weight as of this encounter: 93.6 kg.  -Weight Loss and Dietary Counseling given  Stage 3a chronic kidney disease (CKD) (HCC) -Patient's BUN/creatinine is currently at baseline slightly  worsened -BUN/creatinine went from 23/1.40 -> 17/1.58 ->  12/1.36 -> 12/1.27 -> 12/1.20 -> 16/1.15 -> 19/1.26 -> 22/1.33>> 23/1.79. -Has a Right Nephrostomy tube but has been accidentally dislodged so IR was consulted for further evaluation and Replacement. -Status post right nephrostomy tube exchange successfully 08/22/2021. -Urine output of 620 cc recorded over the past 24 hours and patient is -6.820 L during this hospitalization. -Avoid further nephrotoxic medications,  hypotension renally dose medications. -Gentle hydration for the next 24 hours. -Repeat CMP in the a.m.  Nephrolithiasis -Plan was to have a nephrectomy in the  coming weeks -Patient sees Dr. Tresa Moore of Urology as an outpatient -CT Renal Stone study done as an outpatient which we need to get Results  -Notified Dr. Tresa Moore that patient is currently hospitalized with Aspiration as a courtesy; Dr. Tresa Moore has seen the patient and is planning on rescheduling outpatient appointment.  Hx of subdural hematoma -Was presently obtaining CT in anticipation for a right-sided nephrectomy  later this week however he aspirated un the CT Scanner -Given concern for worsening weakness compared to his baseline MRI was done which was negative for acute CVA. -PT/OT to evaluate and Treat and recommending Home Health PT once ready to be D/C'd home  Sore throat -CT Neck as Above  -CT of the neck done and showed "Submucosal edema in the supraglottic larynx." -ENT to Evaluate and appreciate Dr. Janace Hoard evaluating.  Dr. Janace Hoard had recommended a scope but patient has initially refused.  They recommend continuing to follow to see if the patient proves for scoping.  His hoarseness is starting to improve a little bit and he continues to have no stridor or airway noise. -ENT recommends continuing n.p.o. for now and the patient is doing a little bit better and feel that his voice is respiratory with no breathing issues bilateral.  They feel that his vocal cord weakness that may be why he is aspirating more and they feel that a vocal cord augmentation may be helpful after he recovers more.  They are recommending outpatient evaluation  Severe sepsis ([place]) -Patient aspirated and presented with fever, tachycardia, tachypnea and a source of infection with aspiration findings -Continuing with antibiotics IV Unasyn; 9/10 -SLP done and recommending n.p.o. initially.  -Patient underwent modified barium swallow 08/23/2021 which showed mild aspiration risk but significant improvement and diet advanced to a dysphagia 2 diet.  -Blood cultures ordered negative x5 days.  -Respiratory panel done by PCR showed  negative influenza A and negative influenza B as well as SARS-CoV-2 testing negative by PCR -CTA of the chest PE protocol done and showed "Negative for acute pulmonary embolus. Interval finding of mildly nodular and ground-glass airspace disease in the right lung base with debris and or fluid within the bronchi, given clear lung bases on the CT performed earlier today, suspect that findings could be secondary to aspiration." -Last chest x-ray done and showed "Likely trace right pleural effusion.Interval increase in right base patchy airspace opacity. Finding could represent a combination of infection/inflammation versus atelectasis". -Continue scheduled Xopenex and Atrovent, flutter valve, incentive spirometry, guaifenesin.   -Budesonide 0.25 mg nebs twice daily and continue monitor his respiratory status carefully -IVF now stopped but resumed for the next 24 hours due to hypernatremia. -Given his generalized weakness and worsening swallow function MRI was done to evaluate for CVA and this was negative -Urinalysis done nitrite negative, leukocytes negative, 11-20 WBCs -Blood cultures negative x5 days. -WBC went from 16.1 -> 14.2 -> 11.2 -> 8.9 -> 8.7 -> 6.8 >>  7.7>>> 7.8 >>>> 8.3 -NG Cortrak was placed due to the inability to swallow; -Cortrak discontinued and patient underwent modified barium swallow with mild aspiration risk and diet advanced to a dysphagia 2 diet per SLP.   -Repeat chest x-ray showed "Cardiac and mediastinal contours are unchanged. Improved aeration of the lung bases, likely due to decreased atelectasis. No large pleural effusion or evidence of pneumothorax. Enteric feeding tube partially seen coursing below the diaphragm." -Continue empiric IV antibiotics.    Aspiration pneumonia ([place]) -Patient aspirated PTA and presented with fever, tachycardia, tachypnea and a source of infection with aspiration findings -Was on IV Rocephin and IV azithromycin and transitioned to IV Unasyn  due to worsening respiratory status.  -SLP done and recommending n.p.o. early on during the hospitalization and coretrak placed, patient reassessed by SLP and underwent modified barium swallow with mild aspiration risk and clinical improvement and patient subsequently started on a dysphagia 2 diet. -Cortrak discontinued. -Blood cultures with no growth to date x5 days. -Respiratory panel done by PCR showed negative influenza A and negative influenza B as well as SARS-CoV-2 testing negative by PCR -CTA of the chest PE protocol done and showed "Negative for acute pulmonary embolus. Interval finding of mildly nodular and ground-glass airspace disease in the right lung base with debris and or fluid within the bronchi, given clear lung bases on the CT performed earlier today, suspect that findings could be secondary to aspiration." -Repeat CXR done and showed "Likely trace right pleural effusion. Interval increase in right base patchy airspace opacity. Finding could represent a combination of infection/inflammation versus atelectasis." -Given his generalized weakness and worsening swallow function MRI was done to evaluate for CVA and this was negative -Continue Xopenex and Atrovent as well as Pulmicort.  Also placed on Guaifenesin via tube as well as flutter valve incentive spirometry -Continue to follow cultures;  -WBC went from 16.1 -> 14.2 -> 11.2 -> 8.9 -> 8.7 -> 6.8 -> 7.7>> 7.8 -Repeat CXR showing some decreased atelectasis -Continue to Monitor Respiratory Status carefully  H/O ischemic right PCA stroke -See Hx of Subdural Hematoma  Hypokalemia -Patient's K+ now is 3.6 -Mag Level is now 2.4. -Continue to Monitor and Replete as Necessary -Repeat BMET in the AM   Hypertension -Was n.p.o.  and as such patient started on IV Lopressor.   -Patient diet advanced to MBS.   -Currently tolerating diet.  Continue IV Lopressor and resume home regimen Norvasc.    CKD (chronic kidney disease) stage  3a -Patient's BUNs/creatinine is relatively stable last few days and is now 12/1.36 -> 12/1.27 -> 12/1.20 -> 16/1.15 -> 19/1.26 -> 22/1.33>>> 23/1.51 -IVF now stopped initially however due to hypernatremia patient placed back on gentle hydration.   -Slight bump in creatinine as such we will continue hydration for another 24 hours.  -Avoid further nephrotoxic medications, contrast dyes, hypotension renally dose medications -Repeat bmet in a.m.         DVT prophylaxis: Lovenox Code Status: DNR Family Communication: Updated patient.  No family at bedside.  Disposition: Likely home with home health, when clinically improved hopefully in the next 24 hours.  Status is: Inpatient Remains inpatient appropriate because: Severity of illness           Consultants:  Urology: Dr. Tresa Moore 08/18/2021 IR: Dr.Mir 08/22/2021 ENT Dr. Janace Hoard 08/16/2021  Procedures:  Modified barium swallow 08/23/2021 CT soft tissue neck 08/16/2021 CT angiogram chest 08/15/2021 Successful reinsertion of a 14 French right nephrostomy drain per IR Dr.Mir 08/22/2021  Antimicrobials:  IV Unasyn 08/17/2021>>>> IV Unasyn 08/15/2021 x 1 dose IV azithromycin 08/16/2021>>>> 08/21/2021 IV Rocephin 08/16/2021>>> 08/17/2021   Subjective: Sitting up in chair.  Overall feeling better.  Denies any chest pain.  Denies any shortness of breath.  No abdominal pain.  Denies any coughing with current dysphagia to diet.    Objective: Vitals:   08/24/21 0945 08/24/21 1018 08/24/21 1100 08/24/21 1803  BP:  (!) 146/82 (!) 146/104 137/77  Pulse:   96 76  Resp:   17 13  Temp:   (!) 97.4 F (36.3 C)   TempSrc:   Axillary   SpO2: 94%   94%  Weight:      Height:        Intake/Output Summary (Last 24 hours) at 08/24/2021 1823 Last data filed at 08/24/2021 1723 Gross per 24 hour  Intake 1155.47 ml  Output 721 ml  Net 434.47 ml   Filed Weights   08/21/21 0458 08/23/21 0355 08/24/21 0434  Weight: 93 kg 93.8 kg 94 kg     Examination:  General exam: NAD Respiratory system: CTAB.  No wheezes, no crackles, no rhonchi.  Fair improvement.  Speaking full sentences.  Respiratory effort normal.   Cardiovascular system: Regular rate and rhythm no murmurs rubs or gallops.  No JVD.  No lower extremity edema. Gastrointestinal system: Abdomen is soft, nontender, nondistended, positive bowel sounds.  No rebound.  No guarding.   Central nervous system: Alert and oriented. No focal neurological deficits. Extremities: Symmetric 5 x 5 power. Skin: No rashes, lesions or ulcers Psychiatry: Judgement and insight appear normal. Mood & affect appropriate.     Data Reviewed:   CBC: Recent Labs  Lab 08/18/21 0550 08/19/21 0420 08/20/21 0234 08/21/21 0356 08/23/21 0526 08/24/21 0241  WBC 8.9 8.7 6.8 7.7 7.8 8.3  NEUTROABS 7.1 6.1 4.6 5.1 5.4  --   HGB 12.6* 14.2 13.2 13.5 12.8* 12.2*  HCT 39.3 44.9 40.1 43.1 39.6 37.8*  MCV 87.7 87.5 85.7 88.1 87.0 87.9  PLT 181 225 207 237 234 [id number]    Basic Metabolic Panel: Recent Labs  Lab 08/19/21 0420 08/20/21 0234 08/21/21 0356 08/22/21 0224 08/23/21 0526 08/24/21 0241  NA 144 144 144 145 147* 140  K 3.6 3.4* 4.1 3.7 3.7 3.6  CL 107 106 107 109 108 102  CO2 27 30 27 27 31 27   GLUCOSE 117* 124* 103* 144* 150* 94  BUN 12 16 19 22 23  26*  CREATININE 1.20 1.15 1.26* 1.33* 1.51* 1.57*  CALCIUM 9.7 9.4 9.5 9.6 9.5 9.2  MG 2.2 2.0 2.3  --  2.6* 2.4  PHOS 2.9 3.6 4.5 4.0 4.5  --     GFR: Estimated Creatinine Clearance: 45.9 mL/min (A) (by C-G formula based on SCr of 1.57 mg/dL (H)).  Liver Function Tests: Recent Labs  Lab 08/19/21 0420 08/20/21 0234 08/21/21 0356 08/22/21 0224 08/23/21 0526  AST 18 16 17 17 16   ALT 17 19 20 20 18   ALKPHOS 55 47 50 45 49  BILITOT 0.8 0.3 0.5 0.4 0.4  PROT 6.9 6.5 6.5 6.7 6.3*  ALBUMIN 3.1* 2.9* 2.9* 3.1* 2.9*    CBG: Recent Labs  Lab 08/23/21 0353 08/23/21 0816 08/23/21 1217 08/23/21 1743 08/23/21 2245  GLUCAP  143* 133* 102* 80 103*     Recent Results (from the past 240 hour(s))  Resp Panel by RT-PCR (Flu A&B, Covid) Nasopharyngeal Swab     Status: None   Collection Time: 08/15/21  5:12 PM   Specimen:  Nasopharyngeal Swab; Nasopharyngeal(NP) swabs in vial transport medium  Result Value Ref Range Status   SARS Coronavirus 2 by RT PCR NEGATIVE NEGATIVE Final    Comment: (NOTE) SARS-CoV-2 target nucleic acids are NOT DETECTED.  The SARS-CoV-2 RNA is generally detectable in upper respiratory specimens during the acute phase of infection. The lowest concentration of SARS-CoV-2 viral copies this assay can detect is 138 copies/mL. A negative result does not preclude SARS-Cov-2 infection and should not be used as the sole basis for treatment or other patient management decisions. A negative result may occur with  improper specimen collection/handling, submission of specimen other than nasopharyngeal swab, presence of viral mutation(s) within the areas targeted by this assay, and inadequate number of viral copies(<138 copies/mL). A negative result must be combined with clinical observations, patient history, and epidemiological information. The expected result is Negative.  Fact Sheet for Patients:  EntrepreneurPulse.com.au  Fact Sheet for Healthcare Providers:  IncredibleEmployment.be  This test is no t yet approved or cleared by the [place] FDA and  has been authorized for detection and/or diagnosis of SARS-CoV-2 by FDA under an Emergency Use Authorization (EUA). This EUA will remain  in effect (meaning this test can be used) for the duration of the COVID-19 declaration under Section 564(b)(1) of the Act, 21 U.S.C.section 360bbb-3(b)(1), unless the authorization is terminated  or revoked sooner.       Influenza A by PCR NEGATIVE NEGATIVE Final   Influenza B by PCR NEGATIVE NEGATIVE Final    Comment: (NOTE) The Xpert Xpress SARS-CoV-2/FLU/RSV  plus assay is intended as an aid in the diagnosis of influenza from Nasopharyngeal swab specimens and should not be used as a sole basis for treatment. Nasal washings and aspirates are unacceptable for Xpert Xpress SARS-CoV-2/FLU/RSV testing.  Fact Sheet for Patients: EntrepreneurPulse.com.au  Fact Sheet for Healthcare Providers: IncredibleEmployment.be  This test is not yet approved or cleared by the [place] FDA and has been authorized for detection and/or diagnosis of SARS-CoV-2 by FDA under an Emergency Use Authorization (EUA). This EUA will remain in effect (meaning this test can be used) for the duration of the COVID-19 declaration under Section 564(b)(1) of the Act, 21 U.S.C. section 360bbb-3(b)(1), unless the authorization is terminated or revoked.  Performed at [facility], [address], [place], [place] [address]   Culture, blood (routine x 2)     Status: None   Collection Time: 08/16/21  5:03 PM   Specimen: BLOOD  Result Value Ref Range Status   Specimen Description BLOOD BLOOD LEFT HAND  Final   Special Requests   Final    BOTTLES DRAWN AEROBIC AND ANAEROBIC Blood Culture adequate volume   Culture   Final    NO GROWTH 5 DAYS Performed at [facility], [place] [address], [place], [place] [address]    Report Status 08/21/2021 FINAL  Final  Culture, blood (routine x 2)     Status: None   Collection Time: 08/16/21  5:11 PM   Specimen: BLOOD  Result Value Ref Range Status   Specimen Description BLOOD BLOOD RIGHT HAND  Final   Special Requests   Final    BOTTLES DRAWN AEROBIC AND ANAEROBIC Blood Culture adequate volume   Culture   Final    NO GROWTH 5 DAYS Performed at [facility], [place] [address] [place], [place] [address]    Report Status 08/21/2021 FINAL  Final         Radiology Studies: DG Swallowing [facility] Pathology  Result Date: 08/23/2021 Table formatting from the original  result was not included. Objective Swallowing Evaluation: Type of Study: MBS-Modified Barium Swallow Study  Patient Details Name: Nicholas Mckenzie MRN: [id number] Date of Birth: March 18, 1946 Today's Date: 08/23/2021 Time: SLP Start Time (ACUTE ONLY): 1305 -SLP Stop Time (ACUTE ONLY): 1330 SLP Time Calculation (min) (ACUTE ONLY): 25 min Past Medical History: Past Medical History: Diagnosis Date  CKD (chronic kidney disease) stage 3, GFR 30-59 ml/min (HCC)   Hypertension   Kidney stones   Sleep apnea  Past Surgical History: Past Surgical History: Procedure Laterality Date  CATARACT EXTRACTION    IR NEPHROSTOMY EXCHANGE RIGHT  06/07/2021  IR NEPHROSTOMY EXCHANGE RIGHT  08/22/2021  IR NEPHROSTOMY PLACEMENT RIGHT  04/07/2021  IR PATIENT EVAL TECH 0-60 MINS  06/02/2021  kidney stent    MANDIBLE FRACTURE SURGERY   HPI: Pt is a 76 yo male adm to [facility] after concern for aspiration event with pill.  Pt CT chest showed "Interval finding of mildly nodular and ground-glass airspace  disease in the right lung base with debris and or fluid within the  bronchi, given clear lung bases on the CT performed earlier today,  suspect that findings could be secondary to aspiration."  Swallow eval ordered. 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  Pt reports he tried to swallow pill dry and this resulted in aspiration.  Per CT neck, pt has Submucosal edema in the supraglottic larynx.  Pt saw ENT during hospital coarse but declined to be scoped.  He has been nutritionally supported via Cortrak Tube feeding.  Subjective: pleasant, cooperative  Recommendations for follow up therapy are one component of a multi-disciplinary discharge planning process, led by the attending physician.  Recommendations may be updated based on  patient status, additional functional criteria and insurance authorization. Assessment / Plan / Recommendation Clinical Impressions 08/23/2021 Clinical Impression Patient demonstrating a significantly improved swallow function as compared to MBS on 2/22. He exhibited mildly delayed oral transit of puree, nectar and thin liquid consistencies and mildly impaired mastication of dysphagia 2 solids. During pharyngeal phase, patient exhibited swallow initiation delay at level of vallecular sinus with nectar thick liquids and teaspoon sip thin liquids and swallow initiation delay at level of pyriform sinus with thin liquids via cup and straw sips. When taking larger cup sip and straw sip, patient with trace flash penetration of thin liquids which was well above vocal cords and fully cleared. Barium tablet taken with puree solids transited pharyngeally without difficulty and esophageal sweep did not reveal any s/s dysmotility or stasis. SLP is recommending to initiate Dys 2 solids, thin liquids diet at this time and will follow for diet toleration and ability to trial upgraded solids. SLP Visit Diagnosis Dysphagia, oropharyngeal phase (R13.12) Attention and concentration deficit following -- Frontal lobe and executive function deficit following -- Impact on safety and function Mild aspiration risk   Treatment Recommendations 08/23/2021 Treatment Recommendations Therapy as outlined in treatment plan below   Prognosis 08/23/2021 Prognosis for Safe Diet Advancement Good Barriers to Reach Goals -- Barriers/Prognosis Comment -- Diet Recommendations 08/23/2021 SLP Diet Recommendations Dysphagia 2 (Fine chop) solids;Thin liquid Liquid Administration via Cup;Straw Medication Administration Whole meds with puree Compensations Minimize environmental distractions;Slow rate;Small sips/bites;Other (Comment) Postural Changes --   Other Recommendations 08/23/2021 Recommended Consults -- Oral Care Recommendations  Oral care BID Other Recommendations  -- Follow Up Recommendations Home health SLP Assistance recommended at discharge Frequent or constant Supervision/Assistance Functional Status Assessment Patient has had a recent decline in their functional status and demonstrates the ability to make significant improvements in function in a reasonable and predictable amount of time. Frequency and Duration  08/23/2021 Speech Therapy Frequency (ACUTE ONLY) min 2x/week Treatment Duration 1 week   Oral Phase 08/23/2021 Oral Phase Impaired Oral - Pudding Teaspoon -- Oral - Pudding Cup -- Oral - Honey Teaspoon -- Oral - Honey Cup -- Oral - Nectar Teaspoon NT Oral - Nectar Cup Delayed oral transit Oral - Nectar Straw -- Oral - Thin Teaspoon Delayed oral transit Oral - Thin Cup Delayed oral transit;Holding of bolus Oral - Thin Straw Delayed oral transit;Holding of bolus Oral - Puree Delayed oral transit Oral - Mech Soft Delayed oral transit;Impaired mastication Oral - Regular -- Oral - Multi-Consistency -- Oral - Pill Delayed oral transit;Reduced posterior propulsion Oral Phase - Comment --  Pharyngeal Phase 08/23/2021 Pharyngeal Phase Impaired Pharyngeal- Pudding Teaspoon -- Pharyngeal -- Pharyngeal- Pudding Cup -- Pharyngeal -- Pharyngeal- Honey Teaspoon -- Pharyngeal -- Pharyngeal- Honey Cup -- Pharyngeal -- Pharyngeal- Nectar Teaspoon NT Pharyngeal -- Pharyngeal- Nectar Cup Delayed swallow initiation-vallecula Pharyngeal -- Pharyngeal- Nectar Straw -- Pharyngeal -- Pharyngeal- Thin Teaspoon Delayed swallow initiation-vallecula Pharyngeal Material does not enter airway Pharyngeal- Thin Cup Delayed swallow initiation-pyriform sinuses;Penetration/Aspiration during swallow Pharyngeal Material enters airway, remains ABOVE vocal cords then ejected out Pharyngeal- Thin Straw Delayed swallow initiation-pyriform sinuses;Penetration/Aspiration during swallow Pharyngeal Material enters airway, remains ABOVE vocal cords then ejected out Pharyngeal- Puree WFL Pharyngeal Material does not  enter airway Pharyngeal- Mechanical Soft Delayed swallow initiation-vallecula Pharyngeal -- Pharyngeal- Regular -- Pharyngeal -- Pharyngeal- Multi-consistency -- Pharyngeal -- Pharyngeal- Pill WFL Pharyngeal -- Pharyngeal Comment --  Cervical Esophageal Phase  08/23/2021 Cervical Esophageal Phase WFL Pudding Teaspoon -- Pudding Cup -- Honey Teaspoon -- Honey Cup -- Nectar Teaspoon -- Nectar Cup -- Nectar Straw -- Thin Teaspoon -- Thin Cup -- Thin Straw -- Puree -- Mechanical Soft -- Regular -- Multi-consistency -- Pill -- Cervical Esophageal Comment -- Sonia Baller, MA, CCC-SLP Speech Therapy                          Scheduled Meds:  amLODipine  5 mg Oral Daily   budesonide (PULMICORT) nebulizer solution  0.25 mg Nebulization BID   enoxaparin (LOVENOX) injection  40 mg Subcutaneous Q24H   metoprolol tartrate  5 mg Intravenous Q8H   Continuous Infusions:  sodium chloride 100 mL/hr at 08/24/21 1009   ampicillin-sulbactam (UNASYN) IV 3 g (08/24/21 1804)     LOS: 9 days    Time spent: 35 minutes    Irine Seal, MD [facility]   To contact the attending provider between 7A-7P or the covering provider during after hours 7P-7A, please log into the web site [email or web address] and access using universal [place] password for that web site. If you do not have the password, please call the hospital operator.  08/24/2021, 6:23 PM

## 2021-08-25 ENCOUNTER — Other Ambulatory Visit (HOSPITAL_COMMUNITY): Payer: Self-pay

## 2021-08-25 LAB — BASIC METABOLIC PANEL
Anion gap: 9 (ref 5–15)
BUN: 27 mg/dL — ABNORMAL HIGH (ref 8–23)
CO2: 24 mmol/L (ref 22–32)
Calcium: 8.6 mg/dL — ABNORMAL LOW (ref 8.9–10.3)
Chloride: 101 mmol/L (ref 98–111)
Creatinine, Ser: 1.55 mg/dL — ABNORMAL HIGH (ref 0.61–1.24)
GFR, Estimated: 47 mL/min — ABNORMAL LOW (ref >60)
Glucose, Bld: 98 mg/dL (ref 70–99)
Potassium: 3.4 mmol/L — ABNORMAL LOW (ref 3.5–5.1)
Sodium: 134 mmol/L — ABNORMAL LOW (ref 135–145)

## 2021-08-25 LAB — SEDIMENTATION RATE: Sed Rate: 56 mm/hr — ABNORMAL HIGH (ref 0–16)

## 2021-08-25 LAB — GLUCOSE, CAPILLARY
Glucose-Capillary: 134 mg/dL — ABNORMAL HIGH (ref 70–99)
Glucose-Capillary: 100 mg/dL — ABNORMAL HIGH (ref 70–99)

## 2021-08-25 MED ORDER — IPRATROPIUM-ALBUTEROL 20-100 MCG/ACT IN AERS
1.0000 | INHALATION_SPRAY | Freq: Four times a day (QID) | RESPIRATORY_TRACT | 0 refills | Status: DC | PRN
Start: 2021-08-25 — End: 2022-01-31
  Filled 2021-08-25: qty 4, 30d supply, fill #0

## 2021-08-25 MED ORDER — POTASSIUM CHLORIDE 20 MEQ PO PACK
40.0000 meq | PACK | Freq: Once | ORAL | Status: AC
  Administered 2021-08-25: 40 meq via ORAL
  Filled 2021-08-25: qty 2

## 2021-08-25 MED ORDER — ACETAMINOPHEN 325 MG PO TABS: 650.0000 mg | ORAL_TABLET | Freq: Four times a day (QID) | ORAL | Status: DC | PRN

## 2021-08-25 NOTE — Discharge Summary (Signed)
Physician Discharge Summary  Marqui Dehaven [id number] DOB: 09-27-1945 DOA: 08/15/2021  PCP: [name], MD  Admit date: 08/15/2021 Discharge date: 08/25/2021  Time spent: 60 minutes  Recommendations for Outpatient Follow-up:  Follow-up with Dr. Janace Hoard, ENT within the week. Follow-up with Pahwani, Michell Heinrich, MD in 2 weeks.  On follow-up patient will need a basic metabolic profile, magnesium level, CBC done to follow-up on electrolytes, renal function, hemoglobin. Patient was discharged with home health PT/OT/SLP. Follow-up with Dr. Tresa Moore urology in 2 weeks.   Discharge Diagnoses:  Principal Problem:   Acute respiratory failure with hypoxia (HCC) Active Problems:   CKD (chronic kidney disease) stage 3a   Hypertension   Hypokalemia   H/O ischemic right PCA stroke   Aspiration pneumonia (HCC)   Severe sepsis (HCC)   Sore throat   Hx of subdural hematoma   Nephrolithiasis   Stage 3a chronic kidney disease (CKD) (HCC)   Obesity (BMI 30-39.9)   OSA (obstructive sleep apnea)   Malnutrition of moderate degree   Hypernatremia   Discharge Condition: Stable and improved.  Diet recommendation: Dysphagia 2 diet with thin liquids.  Filed Weights   08/23/21 0355 08/24/21 0434 08/25/21 0355  Weight: 93.8 kg 94 kg 97.2 kg    History of present illness:  HPI per Dr. Tera Mater Inglis is a 76 y.o. male with history of CVA with left-sided hemiparesis, chronic kidney disease stage III, history of kidney stones with obstruction status post right-sided nephrostomy, hypertension, sleep apnea was admitted in December of this year for subdural hematoma was urgently scheduled to have a scan yesterday during which patient swallowed a pill following which became very short of breath and was taken to the ER.  Patient had benign persistent cough after this and also patient wife of the patient's voice became more of a whisper.  Patient also complained of sore throat.   ED Course: In the ER patient  was hypoxic requiring 8 L oxygen CT angiogram of the chest was negative for PE but did show features concerning for aspiration.  Patient's labs did show leukocytosis patient was febrile with temperature for 102 F and COVID test was negative.  Patient was started on empiric antibiotic admitted for further work-up.   Hospital Course:   Assessment and Plan: * Acute respiratory failure with hypoxia (HCC) -In the setting of aspiration event and aspiration pneumonia -Patient aspirated on his Protonix pill and became extremely short of breath going on for a CT scan for his last urology evaluation. -Initiated on empiric antibiotics with IV ceftriaxone and azithromycin which was subsequently changed to IV Unasyn given worsening respiratory status. -Continued on treatment as below delineated in sepsis and aspiration pneumonia -SLP evaluated and recommended n.p.o. except ice chips early on the hospitalization and cortrak placed. -Cortrak removed and patient followed by speech therapy; -Patient subsequently underwent modified barium swallow today with improvement with swallowing and started on a dysphagia 2 diet which she tolerated. -Sats of 94% on room air. -Continuous pulse oximetry and maintain O2 saturations greater than 90% -Patient initially placed on supplemental oxygen via nasal cannula and O2 weaned as tolerated back to room air.   -Patient's voice has become more whisper early on in the hospitalization, so a CT scan of the neck soft tissue was done and showed "Submucosal edema in the supraglottic larynx. -ENT consulted about CT Soft Tissue Neck Findings and Dr. Janace Hoard will come assess the patient and he had recommended scope and the patient the patient refused at  this time and will continue monitor patient's respiratory status carefully and then consider scoping in a few days but now recommending outpatient follow up.  -ENT feels that he would benefit from a vocal cord augmentation once he  recovers -patient improved clinically with sats of 97% on room air on ambulation by day of discharge. -Outpatient follow-up with the ENT 1 week post discharge.    Hypernatremia - Resolved on D5W.   Malnutrition of moderate degree Nutrition Status: Nutrition Problem: Moderate Malnutrition (in the context of social/environmental circumstances) Etiology: dysphagia Signs/Symptoms: mild muscle depletion, mild fat depletion, percent weight loss (10.3% x 6 months) Percent weight loss: 10.3 % Interventions: Refer to RD note for recommendations -Nutritionist Consulted and appreciate Recc's -Patient seen by speech therapy, underwent MBS, currently tolerating a dysphagia 2 diet. -Outpatient follow-up with SLP.     OSA (obstructive sleep apnea) -Patient was maintained on CPAP nightly.    Obesity (BMI 0000000) -Complicates overall prognosis and care -Estimated body mass index is 31.38 kg/m as calculated from the following:   Height as of this encounter: 5\' 8"  (1.727 m).   Weight as of this encounter: 93.6 kg.  -Weight Loss and Dietary Counseling given. -Outpatient follow-up with PCP.  Stage 3a chronic kidney disease (CKD) ([place]) -Patient's BUN/creatinine remained stable and close to baseline during the hospitalization. -BUN/creatinine went from 23/1.40 -> 17/1.58 ->  12/1.36 -> 12/1.27 -> 12/1.20 -> 16/1.15 -> 19/1.26 -> 22/1.33>> 23/1.18. -Has a Right Nephrostomy tube but has been accidentally dislodged so IR was consulted for further evaluation and Replacement. -Status post right nephrostomy tube exchange successfully 08/22/2021. -Patient with good urine output and was -7.521 L during his hospitalization. -Avoided further nephrotoxic medications,  hypotension renally dose medications. -Patient gently hydrated. -Renal function had stabilized by day of discharge and was close to baseline. -Outpatient follow-up.  Nephrolithiasis -Plan was to have a nephrectomy in the coming  weeks -Patient sees Dr. Tresa Moore of Urology as an outpatient -CT Renal Stone study done as an outpatient which we need to get Results  -Notified Dr. Tresa Moore that patient is currently hospitalized with Aspiration as a courtesy; Dr. Tresa Moore has seen the patient and is planning on rescheduling outpatient appointment.  Hx of subdural hematoma -Was presently obtaining CT in anticipation for a right-sided nephrectomy  later this week however he aspirated un the CT Scanner -Given concern for worsening weakness compared to his baseline MRI was done which was negative for acute CVA. -PT/OT evaluated patient and recommended home health therapies which were ordered and set up by day of discharge.   Sore throat -CT Neck as Above  -CT of the neck done and showed "Submucosal edema in the supraglottic larynx." -ENT Dr. Janace Hoard evaluated the patient during the hospitalization. - Dr. Janace Hoard had recommended a scope but patient has initially refused.  They recommend continuing to follow to see if the patient proves for scoping.  His hoarseness is starting to improve a little bit and he continues to have no stridor or airway noise. -ENT recommended continuing n.p.o. for now and the patient is doing a little bit better and feel that his voice is respiratory with no breathing issues bilateral.  They feel that his vocal cord weakness that may be why he is aspirating more and they feel that a vocal cord augmentation may be helpful after he recovers more.  They are recommending outpatient evaluation  Severe sepsis ([place]) -Patient aspirated and presented with fever, tachycardia, tachypnea and a source of infection with aspiration findings -  Patient initially placed on empiric IV antibiotics and subsequently transition to IV Unasyn. -SLP done initially and recommended n.p.o. early on in the hospitalization. -Patient underwent modified barium swallow 08/23/2021 which showed mild aspiration risk but significant improvement and diet  advanced to a dysphagia 2 diet which she tolerated. -Blood cultures ordered negative x5 days.  -Respiratory panel done by PCR showed negative influenza A and negative influenza B as well as SARS-CoV-2 testing negative by PCR -CTA of the chest PE protocol done and showed "Negative for acute pulmonary embolus. Interval finding of mildly nodular and ground-glass airspace disease in the right lung base with debris and or fluid within the bronchi, given clear lung bases on the CT performed earlier today, suspect that findings could be secondary to aspiration." -Last chest x-ray done and showed "Likely trace right pleural effusion.Interval increase in right base patchy airspace opacity. Finding could represent a combination of infection/inflammation versus atelectasis". -Patient also placed on scheduled Xopenex and Atrovent, flutter valve, incentive spirometry, guaifenesin.   -Budesonide 0.25 mg nebs twice daily. -Patient completed full course of IV antibiotics during the hospitalization.  -Respiratory status improved and patient was satting 97% on room air by day of discharge.  -Patient will be discharged on Combivent as needed.  -Given his generalized weakness and worsening swallow function MRI was done to evaluate for CVA and this was negative -Urinalysis done nitrite negative, leukocytes negative, 11-20 WBCs -Blood cultures negative x5 days. -WBC went from 16.1 -> 14.2 -> 11.2 -> 8.9 -> 8.7 -> 6.8 >> 7.7>>> 7.8 >>>> 8.3 -NG Cortrak was placed due to the inability to swallow; -Cortrak discontinued and patient underwent modified barium swallow with mild aspiration risk and diet advanced to a dysphagia 2 diet per SLP.   -Repeat chest x-ray showed "Cardiac and mediastinal contours are unchanged. Improved aeration of the lung bases, likely due to decreased atelectasis. No large pleural effusion or evidence of pneumothorax. Enteric feeding tube partially seen coursing below the diaphragm." -Patient  completed full course of antibiotics during the hospitalization and no further antibiotics needed on discharge.    Aspiration pneumonia ([place]) -Patient aspirated PTA and presented with fever, tachycardia, tachypnea and a source of infection with aspiration findings -Was on IV Rocephin and IV azithromycin and transitioned to IV Unasyn due to worsening respiratory status.  -SLP done and recommended n.p.o. early on during the hospitalization and cortrak placed, patient reassessed by SLP and underwent modified barium swallow with mild aspiration risk and clinical improvement and patient subsequently started on a dysphagia 2 diet which she tolerated. -Patient completed full course of IV antibiotics. -No further antibiotics needed on discharge.. -Cortrak discontinued. -Blood cultures with no growth to date x5 days. -Respiratory panel done by PCR showed negative influenza A and negative influenza B as well as SARS-CoV-2 testing negative by PCR -CTA of the chest PE protocol done and showed "Negative for acute pulmonary embolus. Interval finding of mildly nodular and ground-glass airspace disease in the right lung base with debris and or fluid within the bronchi, given clear lung bases on the CT performed earlier today, suspect that findings could be secondary to aspiration." -Repeat CXR done and showed "Likely trace right pleural effusion. Interval increase in right base patchy airspace opacity. Finding could represent a combination of infection/inflammation versus atelectasis." -Given his generalized weakness and worsening swallow function MRI was done to evaluate for CVA and this was negative -Patient was placed on Xopenex and Atrovent as well as Pulmicort.  Also placed on Guaifenesin via  tube as well as flutter valve incentive spirometry.  H/O ischemic right PCA stroke -See Hx of Subdural Hematoma  Hypokalemia -Repleted.   -Outpatient follow-up.    Hypertension -Was n.p.o.  and as such patient  started on IV Lopressor.   -Patient diet advanced to dysphagia 2 diet. -Patient's home regimen Norvasc resumed and IV Lopressor discontinued. -Outpatient follow-up with PCP.  CKD (chronic kidney disease) stage 3a -Patient's BUNs/creatinine is relatively stable last few days and is now 12/1.36 -> 12/1.27 -> 12/1.20 -> 16/1.15 -> 19/1.26 -> 22/1.33>>> 23/1.51 -IVF  stopped initially however due to hypernatremia patient placed back on gentle hydration.   -Slight bump in creatinine as such gentle hydration was continued for another 24 hours.   -Renal function stabilized.   -Avoided further nephrotoxic medications, contrast dyes, hypotension renally dose medications.        Procedures: Modified barium swallow 08/23/2021 CT soft tissue neck 08/16/2021 CT angiogram chest 08/15/2021 Successful reinsertion of a 14 French right nephrostomy drain per IR Dr.Mir 08/22/2021  Consultations: Urology: Dr. Tresa Moore 08/18/2021 IR: Dr.Mir 08/22/2021 ENT Dr. Janace Hoard 08/16/2021    Discharge Exam: Vitals:   08/25/21 1105 08/25/21 1222  BP: (!) 151/92   Pulse:    Resp:    Temp:    SpO2:  97%    General: NAD. Cardiovascular: Regular rate rhythm no murmurs rubs or gallops.  No JVD.  No lower extremity edema. Respiratory: Clear to auscultation bilaterally.  No wheezes, no crackles, no rhonchi.  Normal respiratory effort.  Speaking in full sentences.  Discharge Instructions   Discharge Instructions     Diet - low sodium heart healthy   Complete by: As directed    Dysphagia 2 diet with thin liquids.   Increase activity slowly   Complete by: As directed       Allergies as of 08/25/2021       Reactions   Statins Other (See Comments)   Pt reports severe weakness and mobility issues with his med, as well as diarrhea        Medication List     STOP taking these medications    aspirin EC 81 MG tablet       TAKE these medications    acetaminophen 325 MG tablet Commonly known as:  TYLENOL Take 2 tablets (650 mg total) by mouth every 6 (six) hours as needed for mild pain (or Fever >/= 101).   amLODipine 5 MG tablet Commonly known as: NORVASC Take 1 tablet (5 mg total) by mouth in the morning and at bedtime. What changed: how much to take   B Complex-C Caps Take 1 capsule by mouth daily.   FISH OIL PO Take 1 capsule by mouth daily.   FLUoxetine 40 MG capsule Commonly known as: PROZAC Take 40 mg by mouth daily.   Ipratropium-Albuterol 20-100 MCG/ACT Aers respimat Commonly known as: COMBIVENT Inhale 1 puff into the lungs every 6 (six) hours as needed for wheezing or shortness of breath.   lactobacillus Pack Take 1 packet (1 g total) by mouth 2 (two) times daily.   melatonin 3 MG Tabs tablet Take 3 mg by mouth at bedtime.   multivitamin with minerals tablet Take 1 tablet by mouth daily.       Allergies  Allergen Reactions   Statins Other (See Comments)    Pt reports severe weakness and mobility issues with his med, as well as diarrhea    Follow-up [facility] Follow up.  Why: Physical Therapy, Occupational Therapy, Speech, Aide. Office to call with visit times. Please call [phone].        Pahwani, Michell Heinrich, MD. Schedule an appointment as soon as possible for a visit in 2 week(s).   Specialty: Internal Medicine Contact information: [address] [place] [address] [phone]         Melissa Montane, MD. Schedule an appointment as soon as possible for a visit in 1 week(s).   Specialty: Otolaryngology Why: Please follow-up early next week Contact information: [place] [address] [phone]         Alexis Frock, MD. Schedule an appointment as soon as possible for a visit in 2 week(s).   Specialty: Urology Contact information: [place] [address] [phone]                  The results of significant diagnostics from this  hospitalization (including imaging, microbiology, ancillary and laboratory) are listed below for reference.    Significant Diagnostic Studies: DG Chest 1 View  Result Date: 08/16/2021 CLINICAL DATA:  Shortness of breath, acute respiratory failure with hypoxia EXAM: CHEST  1 VIEW COMPARISON:  Chest x-ray 08/15/2021, CT angiography chest 08/15/2021 FINDINGS: Enteric tube coursing below the hemidiaphragm. The heart and mediastinal contours are unchanged. Aortic calcification. Interval increase in right base patchy airspace opacity. No pulmonary edema. Likely trace right pleural effusion. No pneumothorax. No acute osseous abnormality. IMPRESSION: 1. Likely trace right pleural effusion. 2. Interval increase in right base patchy airspace opacity. Finding could represent a combination of infection/inflammation versus atelectasis. Electronically Signed   By: Iven Finn M.D.   On: 08/16/2021 23:28   CT SOFT TISSUE NECK WO CONTRAST  Result Date: 08/16/2021 CLINICAL DATA:  Possible aspiration swallowing a pill. Epiglottitis or tonsillitis suspected EXAM: CT NECK WITHOUT CONTRAST TECHNIQUE: Multidetector CT imaging of the neck was performed following the standard protocol without intravenous contrast. RADIATION DOSE REDUCTION: This exam was performed according to the departmental dose-optimization program which includes automated exposure control, adjustment of the mA and/or kV according to patient size and/or use of iterative reconstruction technique. COMPARISON:  04/04/2021 CTA of the neck FINDINGS: Pharynx and larynx: Low-density thickening of the supraglottic larynx especially at the aryepiglottic folds and to a limited degree at the epiglottis. Partially closed glottis at time of imaging. No retropharyngeal collection. Prominent upper thoracic esophagus but no discrete mass or persistence on reformats. Stable appearance from prior chest CT. Salivary glands: No inflammation, mass, or stone. Thyroid: No  worrisome finding. Lymph nodes: None enlarged or abnormal density. Vascular: Negative. Limited intracranial: Negative. Visualized orbits: Negative. Mastoids and visualized paranasal sinuses: Clear. Skeleton: No acute or aggressive process.  Cervical spondylosis Upper chest: No acute finding IMPRESSION: Submucosal edema in the supraglottic larynx. Electronically Signed   By: Jorje Guild M.D.   On: 08/16/2021 07:16   CT Angio Chest PE W/Cm &/Or Wo Cm  Result Date: 08/15/2021 CLINICAL DATA:  Tachycardia EXAM: CT ANGIOGRAPHY CHEST WITH CONTRAST TECHNIQUE: Multidetector CT imaging of the chest was performed using the standard protocol during bolus administration of intravenous contrast. Multiplanar CT image reconstructions and MIPs were obtained to evaluate the vascular anatomy. RADIATION DOSE REDUCTION: This exam was performed according to the departmental dose-optimization program which includes automated exposure control, adjustment of the mA and/or kV according to patient size and/or use of iterative reconstruction technique. CONTRAST:  38mL OMNIPAQUE IOHEXOL 350 MG/ML SOLN COMPARISON:  Chest x-ray 08/15/2021,  CT abdomen pelvis 08/15/2021 FINDINGS: Cardiovascular: Satisfactory opacification of the pulmonary arteries to the segmental level. No evidence of pulmonary embolism. Mild aortic atherosclerosis. No aneurysm. Normal cardiac size. No pericardial effusion Mediastinum/Nodes: Midline trachea. No suspicious thyroid nodule. No suspicious lymph nodes. Small hiatal hernia. Lungs/Pleura: No pleural effusion or pneumothorax. Mild nodularity and ground-glass density in the right lower lobe with debris within right lower lobe bronchi. Upper Abdomen: Partially visualized atrophic right kidney. Cysts upper pole left kidney Musculoskeletal: No acute osseous abnormality. Review of the MIP images confirms the above findings. IMPRESSION: 1. Negative for acute pulmonary embolus. 2. Interval finding of mildly nodular and  ground-glass airspace disease in the right lung base with debris and or fluid within the bronchi, given clear lung bases on the CT performed earlier today, suspect that findings could be secondary to aspiration. Aortic Atherosclerosis (ICD10-I70.0). Electronically Signed   By: Donavan Foil M.D.   On: 08/15/2021 18:41   MR BRAIN WO CONTRAST  Result Date: 08/16/2021 CLINICAL DATA:  Acute neuro deficit.  Rule out stroke EXAM: MRI HEAD WITHOUT CONTRAST TECHNIQUE: Multiplanar, multiecho pulse sequences of the brain and surrounding structures were obtained without intravenous contrast. COMPARISON:  CT head 07/18/2021.  MRI head 04/05/2021 FINDINGS: Brain: Negative for acute infarct. Chronic infarct right occipital lobe. Chronic microvascular ischemic change in the white matter. Chronic infarct in the pons and left middle cerebellar peduncle. Negative for hemorrhage or mass. Generalized atrophy. Vascular: Normal arterial flow voids. Skull and upper cervical spine: No focal skeletal lesion. Sinuses/Orbits: Mild mucosal edema paranasal sinuses. Bilateral cataract extraction Other: None IMPRESSION: Atrophy and chronic ischemic change. Negative for acute infarct. Electronically Signed   By: Franchot Gallo M.D.   On: 08/16/2021 11:25   DG CHEST PORT 1 VIEW  Result Date: 08/22/2021 CLINICAL DATA:  Shortness of breath EXAM: PORTABLE CHEST 1 VIEW COMPARISON:  Chest x-ray dated August 21, 2021 obtained at 06:47 FINDINGS: Cardiac and mediastinal contours are unchanged. Improved aeration of the lung bases, likely due to decreased atelectasis. No large pleural effusion or evidence of pneumothorax. Enteric feeding tube partially seen coursing below the diaphragm. IMPRESSION: Compared aeration of the lung bases, likely due to decreased atelectasis. Electronically Signed   By: Yetta Glassman M.D.   On: 08/22/2021 08:06   DG CHEST PORT 1 VIEW  Result Date: 08/21/2021 CLINICAL DATA:  Shortness of breath EXAM: PORTABLE  CHEST 1 VIEW COMPARISON:  Previous studies including the examination of 08/19/2021 FINDINGS: Transverse diameter of heart is increased. There are patchy infiltrates in right lower lung fields with possible slight improvement. No new infiltrates are seen. There are no signs of alveolar pulmonary edema. Enteric tube is noted traversing the esophagus. IMPRESSION: Patchy infiltrates are seen in the right lower lung fields suggesting atelectasis/pneumonia with interval improvement. Electronically Signed   By: Elmer Picker M.D.   On: 08/21/2021 09:34   DG CHEST PORT 1 VIEW  Result Date: 08/19/2021 CLINICAL DATA:  Acute respiratory failure with hypoxia. Follow-up exam. EXAM: PORTABLE CHEST 1 VIEW COMPARISON:  08/18/2021 and older studies. FINDINGS: Cardiac silhouette mildly enlarged.  No mediastinal or hilar masses. Right lung base opacity, without convincing change from the prior study allowing for differences in patient positioning and lung volumes. Remainder of the lungs is clear. No convincing pleural effusion or pneumothorax. Enteric tube passes below the diaphragm, unchanged. IMPRESSION: 1. Right lung base opacity consistent with atelectasis and/or pneumonia, without convincing change from the most recent prior study, less prominent than it was on  08/16/2021. 2. No other evidence of acute cardiopulmonary disease and no convincing change from the previous day's study. Electronically Signed   By: Lajean Manes M.D.   On: 08/19/2021 09:01   DG CHEST PORT 1 VIEW  Result Date: 08/18/2021 CLINICAL DATA:  Shortness of breath, aspiration EXAM: PORTABLE CHEST 1 VIEW COMPARISON:  Portable exam [id number] hours compared to 08/16/2021 FINDINGS: Feeding tube extends into stomach. Enlargement of cardiac silhouette. Stable mediastinal contours and pulmonary vascularity. LEFT basilar subsegmental atelectasis with persistent atelectasis and consolidation at RIGHT lung base. No definite pleural effusion or pneumothorax.  IMPRESSION: Enlargement of cardiac silhouette. Persistent atelectasis and infiltrate at RIGHT base. LEFT basilar subsegmental atelectasis. Electronically Signed   By: Lavonia Dana M.D.   On: 08/18/2021 08:21   DG Chest Port 1 View  Result Date: 08/15/2021 CLINICAL DATA:  Shortness of breath. EXAM: PORTABLE CHEST 1 VIEW COMPARISON:  Two-view chest x-ray 06/06/2021 FINDINGS: Heart size is normal. Atherosclerotic changes are again seen at the aortic arch. Chronic interstitial markings present. No edema or effusion is present. No focal airspace disease is present. Degenerative changes are noted in the shoulders. IMPRESSION: No acute cardiopulmonary disease or significant interval change. Electronically Signed   By: San Morelle M.D.   On: 08/15/2021 16:28   DG Abd Portable 1V  Result Date: 08/17/2021 CLINICAL DATA:  NG tube placement EXAM: PORTABLE ABDOMEN - 1 VIEW COMPARISON:  08/16/2021 FINDINGS: Tip of enteric tube is seen in the region of distal antrum/pylorus of the stomach. There is percutaneous right nephrostomy catheter. Right ureteral stent is seen. There is contrast in the lumen of colon. Diverticula are seen in the colon. There is no significant small bowel dilation. IMPRESSION: Tip of enteric tube is seen in the region distal antrum/pylorus of the stomach. Electronically Signed   By: Elmer Picker M.D.   On: 08/17/2021 13:09   DG Abd Portable 1V  Result Date: 08/16/2021 CLINICAL DATA:  Feeding tube placement. EXAM: PORTABLE ABDOMEN - 1 VIEW COMPARISON:  None. FINDINGS: Feeding tube terminates near the pylorus. Visualized left lung base is clear. IMPRESSION: Pain tube terminates near the pylorus. Electronically Signed   By: Lorin Picket M.D.   On: 08/16/2021 16:25   DG Swallowing Func-Speech Pathology  Result Date: 08/23/2021 Table formatting from the original result was not included. Objective Swallowing Evaluation: Type of Study: MBS-Modified Barium Swallow Study  Patient  Details Name: Estiven Phillipe MRN: [id number] Date of Birth: [birth year] Today's Date: 08/23/2021 Time: SLP Start Time (ACUTE ONLY): 1305 -SLP Stop Time (ACUTE ONLY): 1330 SLP Time Calculation (min) (ACUTE ONLY): 25 min Past Medical History: Past Medical History: Diagnosis Date  CKD (chronic kidney disease) stage 3, GFR 30-59 ml/min (HCC)   Hypertension   Kidney stones   Sleep apnea  Past Surgical History: Past Surgical History: Procedure Laterality Date  CATARACT EXTRACTION    IR NEPHROSTOMY EXCHANGE RIGHT  06/07/2021  IR NEPHROSTOMY EXCHANGE RIGHT  08/22/2021  IR NEPHROSTOMY PLACEMENT RIGHT  04/07/2021  IR PATIENT EVAL TECH 0-60 MINS  06/02/2021  kidney stent    MANDIBLE FRACTURE SURGERY   HPI: Pt is a 76 yo male adm to [facility] after concern for aspiration event with pill.  Pt CT chest showed "Interval finding of mildly nodular and ground-glass airspace  disease in the right lung base with debris and or fluid within the  bronchi, given clear lung bases on the CT performed earlier today,  suspect that findings could be secondary to aspiration."  Swallow eval ordered.  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  Pt reports he tried to swallow pill dry and this resulted in aspiration.  Per CT neck, pt has Submucosal edema in the supraglottic larynx.  Pt saw ENT during hospital coarse but declined to be scoped.  He has been nutritionally supported via Cortrak Tube feeding.  Subjective: pleasant, cooperative  Recommendations for follow up therapy are one component of a multi-disciplinary discharge planning process, led by the attending physician.  Recommendations may be updated based on patient status, additional functional criteria and insurance authorization. Assessment / Plan / Recommendation  Clinical Impressions 08/23/2021 Clinical Impression Patient demonstrating a significantly improved swallow function as compared to MBS on 2/22. He exhibited mildly delayed oral transit of puree, nectar and thin liquid consistencies and mildly impaired mastication of dysphagia 2 solids. During pharyngeal phase, patient exhibited swallow initiation delay at level of vallecular sinus with nectar thick liquids and teaspoon sip thin liquids and swallow initiation delay at level of pyriform sinus with thin liquids via cup and straw sips. When taking larger cup sip and straw sip, patient with trace flash penetration of thin liquids which was well above vocal cords and fully cleared. Barium tablet taken with puree solids transited pharyngeally without difficulty and esophageal sweep did not reveal any s/s dysmotility or stasis. SLP is recommending to initiate Dys 2 solids, thin liquids diet at this time and will follow for diet toleration and ability to trial upgraded solids. SLP Visit Diagnosis Dysphagia, oropharyngeal phase (R13.12) Attention and concentration deficit following -- Frontal lobe and executive function deficit following -- Impact on safety and function Mild aspiration risk   Treatment Recommendations 08/23/2021 Treatment Recommendations Therapy as outlined in treatment plan below   Prognosis 08/23/2021 Prognosis for Safe Diet Advancement Good Barriers to Reach Goals -- Barriers/Prognosis Comment -- Diet Recommendations 08/23/2021 SLP Diet Recommendations Dysphagia 2 (Fine chop) solids;Thin liquid Liquid Administration via Cup;Straw Medication Administration Whole meds with puree Compensations Minimize environmental distractions;Slow rate;Small sips/bites;Other (Comment) Postural Changes --   Other Recommendations 08/23/2021 Recommended Consults -- Oral Care Recommendations Oral care BID Other Recommendations -- Follow Up Recommendations Home health SLP Assistance recommended at discharge Frequent or constant  Supervision/Assistance Functional Status Assessment Patient has had a recent decline in their functional status and demonstrates the ability to make significant improvements in function in a reasonable and predictable amount of time. Frequency and Duration  08/23/2021 Speech Therapy Frequency (ACUTE ONLY) min 2x/week Treatment Duration 1 week   Oral Phase 08/23/2021 Oral Phase Impaired Oral - Pudding Teaspoon -- Oral - Pudding Cup -- Oral - Honey Teaspoon -- Oral - Honey Cup -- Oral - Nectar Teaspoon NT Oral - Nectar Cup Delayed oral transit Oral - Nectar Straw -- Oral - Thin Teaspoon Delayed oral transit Oral - Thin Cup Delayed oral transit;Holding of bolus Oral - Thin Straw Delayed oral transit;Holding of bolus Oral - Puree Delayed oral transit Oral - Mech Soft Delayed oral transit;Impaired mastication Oral - Regular -- Oral - Multi-Consistency -- Oral - Pill Delayed oral transit;Reduced posterior propulsion Oral Phase - Comment --  Pharyngeal Phase 08/23/2021 Pharyngeal Phase Impaired Pharyngeal- Pudding Teaspoon -- Pharyngeal -- Pharyngeal- Pudding Cup -- Pharyngeal -- Pharyngeal- Honey Teaspoon -- Pharyngeal -- Pharyngeal- Honey Cup -- Pharyngeal -- Pharyngeal- Nectar Teaspoon NT Pharyngeal -- Pharyngeal- Nectar Cup Delayed  swallow initiation-vallecula Pharyngeal -- Pharyngeal- Nectar Straw -- Pharyngeal -- Pharyngeal- Thin Teaspoon Delayed swallow initiation-vallecula Pharyngeal Material does not enter airway Pharyngeal- Thin Cup Delayed swallow initiation-pyriform sinuses;Penetration/Aspiration during swallow Pharyngeal Material enters airway, remains ABOVE vocal cords then ejected out Pharyngeal- Thin Straw Delayed swallow initiation-pyriform sinuses;Penetration/Aspiration during swallow Pharyngeal Material enters airway, remains ABOVE vocal cords then ejected out Pharyngeal- Puree WFL Pharyngeal Material does not enter airway Pharyngeal- Mechanical Soft Delayed swallow initiation-vallecula Pharyngeal --  Pharyngeal- Regular -- Pharyngeal -- Pharyngeal- Multi-consistency -- Pharyngeal -- Pharyngeal- Pill WFL Pharyngeal -- Pharyngeal Comment --  Cervical Esophageal Phase  08/23/2021 Cervical Esophageal Phase WFL Pudding Teaspoon -- Pudding Cup -- Honey Teaspoon -- Honey Cup -- Nectar Teaspoon -- Nectar Cup -- Nectar Straw -- Thin Teaspoon -- Thin Cup -- Thin Straw -- Puree -- Mechanical Soft -- Regular -- Multi-consistency -- Pill -- Cervical Esophageal Comment -- Sonia Baller, MA, CCC-SLP Speech Therapy                     DG Swallowing Func-Speech Pathology  Result Date: 08/16/2021 Table formatting from the original result was not included. Objective Swallowing Evaluation: Type of Study: MBS-Modified Barium Swallow Study  Patient Details Name: Drexler Falcon MRN: [id number] Date of Birth: 01-Oct-1945 Today's Date: 08/16/2021 Time: SLP Start Time (ACUTE ONLY): 0810 -SLP Stop Time (ACUTE ONLY): 0845 SLP Time Calculation (min) (ACUTE ONLY): 35 min Past Medical History: Past Medical History: Diagnosis Date  CKD (chronic kidney disease) stage 3, GFR 30-59 ml/min (HCC)   Hypertension   Kidney stones   Sleep apnea  Past Surgical History: Past Surgical History: Procedure Laterality Date  CATARACT EXTRACTION    IR NEPHROSTOMY EXCHANGE RIGHT  06/07/2021  IR NEPHROSTOMY PLACEMENT RIGHT  04/07/2021  IR PATIENT EVAL TECH 0-60 MINS  06/02/2021  kidney stent    MANDIBLE FRACTURE SURGERY   HPI: Pt is a 76 yo male adm to [facility] after concern for aspiration event with pill.  Pt CT chest showed "Interval finding of mildly nodular and ground-glass airspace  disease in the right lung base with debris and or fluid within the  bronchi, given clear lung bases on the CT performed earlier today,  suspect that findings could be secondary to aspiration."  Swallow eval ordered. 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  Pt reports he tried to swallow pill dry and this resulted in aspiration.  Per CT neck, pt has Submucosal edema in the supraglottic larynx.  MBS indicated.  Subjective: pt in bed, assist to slide into flouro chair  Recommendations for follow up therapy are one component of a multi-disciplinary discharge planning process, led by the attending physician.  Recommendations may be updated based on patient status, additional functional criteria and insurance authorization. Assessment / Plan / Recommendation Clinical Impressions 08/16/2021 Clinical Impression Patient currently presents with severe pharyngo with suspected cervical esophageal dysphagia with sensorimotor deficits.  Minimally decreased oral control noted but primary deficit is pharyngeal.  Delayed swallow trigger to pyriform sinus with liquids as well as impaired tongue base retraction/airway closure allows aspiration of secretions, laryngeal penetration and aspiration of all consistencies tested.  Pt without consistent sensation to aspiration and reflexive nor cued cough effectively cleared aspirates.  Various postures including chin tuck with and without head turn left and chair reclined  to approx 45* did not completely prevent penetration/aspiration.  Chair reclined effective x1 with 1/2 tsp nectar only to prevent aspiration.  In addition, if pt cleared larynx, he repenetrated/aspirated due to pharyngeal retention.  SLP questions if pt's Submucosal edema in the supraglottic larynx may be negatively impacting laryngeal closure - contributing to his dysphagia.  Educated pt during testing using video feedback.  Pt with h/o dysphagia per his statement, with nasal regurgitation if eating too fast and a "little coughing" with po intake. Suspect some component of chronic dysphagia/aspiration due to prior CVA that pt has managed due to mobiilty, strength of  cough, overall health, etc until this recent severe aspiration episode. At this time, due to pt report of severe worsening dysphagia,  recommend consider short term alternative means of nutrition and SLP follow up for dysphagia management.  Repeat MBS will be indicated prior to intiation of po diet due to pt's sensorimotor deficits.   SLP Visit Diagnosis Dysphagia, pharyngeal phase (R13.13);Dysphagia, unspecified (R13.10) Attention and concentration deficit following -- Frontal lobe and executive function deficit following -- Impact on safety and function Severe aspiration risk;Risk for inadequate nutrition/hydration   Treatment Recommendations 08/16/2021 Treatment Recommendations Therapy as outlined in treatment plan below   Prognosis 08/16/2021 Prognosis for Safe Diet Advancement Fair Barriers to Reach Goals Severity of deficits Barriers/Prognosis Comment -- Diet Recommendations 08/16/2021 SLP Diet Recommendations NPO;Ice chips PRN after oral care Liquid Administration via -- Medication Administration Via alternative means Compensations Multiple dry swallows after each bite/sip;Other (Comment) Postural Changes --   Other Recommendations 08/16/2021 Recommended Consults -- Oral Care Recommendations Oral care QID Other Recommendations -- Follow Up Recommendations Home health SLP Assistance recommended at discharge Frequent or constant Supervision/Assistance Functional Status Assessment Patient has had a recent decline in their functional status and demonstrates the ability to make significant improvements in function in a reasonable and predictable amount of time. Frequency and Duration  08/16/2021 Speech Therapy Frequency (ACUTE ONLY) min 2x/week Treatment Duration 1 week   Oral Phase 08/16/2021 Oral Phase Impaired Oral - Pudding Teaspoon -- Oral - Pudding Cup -- Oral - Honey Teaspoon -- Oral - Honey Cup -- Oral - Nectar Teaspoon WFL Oral - Nectar Cup -- Oral - Nectar Straw -- Oral - Thin Teaspoon WFL Oral - Thin Cup --  Oral - Thin Straw WFL Oral - Puree WFL Oral - Mech Soft -- Oral - Regular -- Oral - Multi-Consistency -- Oral - Pill -- Oral Phase - Comment --  Pharyngeal Phase 08/16/2021 Pharyngeal Phase Impaired Pharyngeal- Pudding Teaspoon -- Pharyngeal -- Pharyngeal- Pudding Cup -- Pharyngeal -- Pharyngeal- Honey Teaspoon -- Pharyngeal -- Pharyngeal- Honey Cup -- Pharyngeal -- Pharyngeal- Nectar Teaspoon Delayed swallow initiation-pyriform sinuses;Penetration/Aspiration during swallow;Penetration/Apiration after swallow;Inter-arytenoid space residue;Lateral channel residue;Reduced pharyngeal peristalsis;Penetration/Aspiration before swallow;Reduced airway/laryngeal closure;Pharyngeal residue - valleculae;Reduced tongue base retraction;Reduced epiglottic inversion Pharyngeal Material enters airway, passes BELOW cords without attempt by patient to eject out (silent aspiration);Material enters airway, passes BELOW cords and not ejected out despite cough attempt by patient Pharyngeal- Nectar Cup -- Pharyngeal -- Pharyngeal- Nectar Straw -- Pharyngeal -- Pharyngeal- Thin Teaspoon Reduced pharyngeal peristalsis;Moderate aspiration;Pharyngeal residue - pyriform;Pharyngeal residue - valleculae;Delayed swallow initiation-pyriform sinuses;Reduced airway/laryngeal closure Pharyngeal Material enters airway, passes BELOW cords without attempt by patient to eject out (silent aspiration) Pharyngeal- Thin Cup -- Pharyngeal -- Pharyngeal- Thin Straw Reduced pharyngeal peristalsis;Penetration/Aspiration during swallow;Penetration/Apiration after swallow;Significant aspiration (Amount);Inter-arytenoid space residue;Lateral channel residue;Reduced airway/laryngeal closure;Reduced epiglottic inversion;Reduced tongue base retraction;Pharyngeal residue - valleculae;Pharyngeal residue - pyriform Pharyngeal Material enters airway,  passes BELOW cords without attempt by patient to eject out (silent aspiration);Material enters airway, passes BELOW cords  and not ejected out despite cough attempt by patient Pharyngeal- Puree Lateral channel residue;Inter-arytenoid space residue;Penetration/Apiration after swallow;Reduced pharyngeal peristalsis;Penetration/Aspiration during swallow;Reduced airway/laryngeal closure;Reduced epiglottic inversion;Reduced tongue base retraction;Pharyngeal residue - valleculae Pharyngeal Material enters airway, passes BELOW cords without attempt by patient to eject out (silent aspiration);Material enters airway, passes BELOW cords and not ejected out despite cough attempt by patient Pharyngeal- Mechanical Soft -- Pharyngeal -- Pharyngeal- Regular -- Pharyngeal -- Pharyngeal- Multi-consistency -- Pharyngeal -- Pharyngeal- Pill -- Pharyngeal -- Pharyngeal Comment Chin tuck with and without head turn, reclined to approx 45* did not effectively protect airway.  Multiple swallows aids pharyngeal clearance.  Shallow pyriform sinus noted which allows spillage of barium into larynx/trachea before and postswallow.   Reflexive and cued cough did not fully clear aspirates mixed with secretions.  Pt propelled some of pharyngeal retention into oral cavity and expectorated during MBS.  Aspiration occured with nectar before the swallow as barium spilled from pyriform sinus into open larynx before swallow.  Cervical Esophageal Phase  08/16/2021 Cervical Esophageal Phase Impaired Pudding Teaspoon -- Pudding Cup -- Honey Teaspoon -- Honey Cup -- Nectar Teaspoon -- Nectar Cup -- Nectar Straw -- Thin Teaspoon -- Thin Cup -- Thin Straw -- Puree -- Mechanical Soft -- Regular -- Multi-consistency -- Pill -- Cervical Esophageal Comment Suboptimal view due to pt's shoulders paritally blocking proximal esophagus; Upon esophageal sweep, pt appeared with minimal amount of barium that appeared mixed with secretions - radiologist not present to confirm. Macario Golds 08/16/2021, 10:11 AM    Kathleen Lime, MS [facility] SLP Acute Rehab Services Office [phone] Pager  [phone]                  IR NEPHROSTOMY EXCHANGE RIGHT  Result Date: 08/22/2021 INDICATION: 76 year old gentleman with chronic indwelling right nephrostomy drain returns with dislodged drain. EXAM: Fluoroscopy guided re-insertion of right percutaneous nephrostomy drain. COMPARISON:  None. MEDICATIONS: None ANESTHESIA/SEDATION: None CONTRAST:  5 mL of Omnipaque 300-administered into the collecting system(s) FLUOROSCOPY TIME:  Fluoroscopy Time: 0.9 minutes (28 mGy). COMPLICATIONS: None immediate. PROCEDURE: Informed written consent was obtained from the patient after a thorough discussion of the procedural risks, benefits and alternatives. All questions were addressed. Maximal Sterile Barrier Technique was utilized including caps, mask, sterile gowns, sterile gloves, sterile drape, hand hygiene and skin antiseptic. A timeout was performed prior to the initiation of the procedure. The existing right nephrostomy tract was access with a Kumpe catheter. Kumpe catheter advanced into the right renal collecting system. Contrast administered through the Kumpe catheter under fluoroscopy confirmed appropriate positioning. Kumpe catheter exchanged for 14 French multipurpose pigtail drain over 0.035 inch guidewire. Final position confirmed by administering contrast through the drain. Drain secured to skin with suture and connected to bag. IMPRESSION: Successful reinsertion of 14 French right nephrostomy drain. PLAN: Return in 8 weeks for routine exchange. Electronically Signed   By: Miachel Roux M.D.   On: 08/22/2021 15:30   SLEEP STUDY DOCUMENTS  Result Date: 08/11/2021 Ordered by an unspecified provider.   Microbiology: Recent Results (from the past 240 hour(s))  Resp Panel by RT-PCR (Flu A&B, Covid) Nasopharyngeal Swab     Status: None   Collection Time: 08/15/21  5:12 PM   Specimen: Nasopharyngeal Swab; Nasopharyngeal(NP) swabs in vial transport medium  Result Value Ref Range Status   SARS Coronavirus 2  by RT PCR NEGATIVE NEGATIVE Final  Comment: (NOTE) SARS-CoV-2 target nucleic acids are NOT DETECTED.  The SARS-CoV-2 RNA is generally detectable in upper respiratory specimens during the acute phase of infection. The lowest concentration of SARS-CoV-2 viral copies this assay can detect is 138 copies/mL. A negative result does not preclude SARS-Cov-2 infection and should not be used as the sole basis for treatment or other patient management decisions. A negative result may occur with  improper specimen collection/handling, submission of specimen other than nasopharyngeal swab, presence of viral mutation(s) within the areas targeted by this assay, and inadequate number of viral copies(<138 copies/mL). A negative result must be combined with clinical observations, patient history, and epidemiological information. The expected result is Negative.  Fact Sheet for Patients:  EntrepreneurPulse.com.au  Fact Sheet for Healthcare Providers:  IncredibleEmployment.be  This test is no t yet approved or cleared by the [place] FDA and  has been authorized for detection and/or diagnosis of SARS-CoV-2 by FDA under an Emergency Use Authorization (EUA). This EUA will remain  in effect (meaning this test can be used) for the duration of the COVID-19 declaration under Section 564(b)(1) of the Act, 21 U.S.C.section 360bbb-3(b)(1), unless the authorization is terminated  or revoked sooner.       Influenza A by PCR NEGATIVE NEGATIVE Final   Influenza B by PCR NEGATIVE NEGATIVE Final    Comment: (NOTE) The Xpert Xpress SARS-CoV-2/FLU/RSV plus assay is intended as an aid in the diagnosis of influenza from Nasopharyngeal swab specimens and should not be used as a sole basis for treatment. Nasal washings and aspirates are unacceptable for Xpert Xpress SARS-CoV-2/FLU/RSV testing.  Fact Sheet for Patients: EntrepreneurPulse.com.au  Fact  Sheet for Healthcare Providers: IncredibleEmployment.be  This test is not yet approved or cleared by the [place] FDA and has been authorized for detection and/or diagnosis of SARS-CoV-2 by FDA under an Emergency Use Authorization (EUA). This EUA will remain in effect (meaning this test can be used) for the duration of the COVID-19 declaration under Section 564(b)(1) of the Act, 21 U.S.C. section 360bbb-3(b)(1), unless the authorization is terminated or revoked.  Performed at [facility], [address], [place], [place] [address]   Culture, blood (routine x 2)     Status: None   Collection Time: 08/16/21  5:03 PM   Specimen: BLOOD  Result Value Ref Range Status   Specimen Description BLOOD BLOOD LEFT HAND  Final   Special Requests   Final    BOTTLES DRAWN AEROBIC AND ANAEROBIC Blood Culture adequate volume   Culture   Final    NO GROWTH 5 DAYS Performed at [facility], [place] [address], [place], [place] [address]    Report Status 08/21/2021 FINAL  Final  Culture, blood (routine x 2)     Status: None   Collection Time: 08/16/21  5:11 PM   Specimen: BLOOD  Result Value Ref Range Status   Specimen Description BLOOD BLOOD RIGHT HAND  Final   Special Requests   Final    BOTTLES DRAWN AEROBIC AND ANAEROBIC Blood Culture adequate volume   Culture   Final    NO GROWTH 5 DAYS Performed at [facility], [place] [address] [place], [place] [address]    Report Status 08/21/2021 FINAL  Final     Labs: Basic Metabolic Panel: Recent Labs  Lab 08/19/21 0420 08/20/21 0234 08/21/21 0356 08/22/21 0224 08/23/21 0526 08/24/21 0241 08/25/21 0303  NA 144 144 144 145 147* 140 134*  K 3.6 3.4* 4.1 3.7 3.7  3.6 3.4*  CL 107 106 107 109 108 102 101  CO2 27 30 27 27 31 27 24   GLUCOSE 117* 124* 103* 144* 150* 94 98  BUN 12 16 19 22 23  26* 27*  CREATININE 1.20 1.15 1.26* 1.33* 1.51* 1.57* 1.55*  CALCIUM 9.7 9.4 9.5 9.6 9.5 9.2 8.6*  MG  2.2 2.0 2.3  --  2.6* 2.4  --   PHOS 2.9 3.6 4.5 4.0 4.5  --   --    Liver Function Tests: Recent Labs  Lab 08/19/21 0420 08/20/21 0234 08/21/21 0356 08/22/21 0224 08/23/21 0526  AST 18 16 17 17 16   ALT 17 19 20 20 18   ALKPHOS 55 47 50 45 49  BILITOT 0.8 0.3 0.5 0.4 0.4  PROT 6.9 6.5 6.5 6.7 6.3*  ALBUMIN 3.1* 2.9* 2.9* 3.1* 2.9*   No results for input(s): LIPASE, AMYLASE in the last 168 hours. No results for input(s): AMMONIA in the last 168 hours. CBC: Recent Labs  Lab 08/19/21 0420 08/20/21 0234 08/21/21 0356 08/23/21 0526 08/24/21 0241  WBC 8.7 6.8 7.7 7.8 8.3  NEUTROABS 6.1 4.6 5.1 5.4  --   HGB 14.2 13.2 13.5 12.8* 12.2*  HCT 44.9 40.1 43.1 39.6 37.8*  MCV 87.5 85.7 88.1 87.0 87.9  PLT 225 207 237 234 231   Cardiac Enzymes: No results for input(s): CKTOTAL, CKMB, CKMBINDEX, TROPONINI in the last 168 hours. BNP: BNP (last 3 results) Recent Labs    06/07/21 0408 08/15/21 1712  BNP 66.6 24.1    ProBNP (last 3 results) No results for input(s): PROBNP in the last 8760 hours.  CBG: Recent Labs  Lab 08/23/21 1217 08/23/21 1743 08/23/21 2245 08/25/21 0753 08/25/21 1132  GLUCAP 102* 80 103* 134* 100*       Signed:  Irine Seal MD.  [facility] 08/25/2021, 3:54 PM

## 2021-08-25 NOTE — Care Management Important Message (Signed)
Important Message ? ?Patient Details  ?Name: Nicholas Mckenzie ?MRN: [id number] ?Date of Birth: Jan 10, 1946 ? ? ?Medicare Important Message Given:  Yes ? ? ? ? ?Renie Ora ?08/25/2021, 10:52 AM ?

## 2021-08-25 NOTE — Progress Notes (Signed)
Occupational Therapy Treatment ?Patient Details ?Name: Nicholas Mckenzie ?MRN: [id number] ?DOB: 1946/02/05 ?Today's Date: 08/25/2021 ? ? ?History of present illness 76 y.o. male admitted 2/21 with SOB and tachycardia with possibly choking on pill. Pt with acute respiratory failure. PMhx. 06/06/2021 fall with SDH, CKD stage IIIa, nephrostomy tube, sleep apnea, HTN, OSA, depression/anxiety, CVA with residual left hemiparesis. ?  ?OT comments ? Pt progressing towards acute OT goals. Focus of session was grooming tasks in standing and LB dressing in sit<>stand, min guard assist for balance. Spouse present throughout session. D/c plan remains appropriate.  ? ?Recommendations for follow up therapy are one component of a multi-disciplinary discharge planning process, led by the attending physician.  Recommendations may be updated based on patient status, additional functional criteria and insurance authorization. ?   ?Follow Up Recommendations ? Home health OT  ?  ?Assistance Recommended at Discharge Frequent or constant Supervision/Assistance  ?Patient can return home with the following ? A little help with walking and/or transfers;A little help with bathing/dressing/bathroom;Assistance with cooking/housework;Direct supervision/assist for medications management;Direct supervision/assist for financial management;Assist for transportation;Help with stairs or ramp for entrance ?  ?Equipment Recommendations ? None recommended by OT  ?  ?Recommendations for Other Services   ? ?  ?Precautions / Restrictions Precautions ?Precautions: Fall ?Precaution Comments: Rt nephrostomy tube ?Restrictions ?Weight Bearing Restrictions: No  ? ? ?  ? ?Mobility Bed Mobility ?  ?  ?  ?  ?  ?  ?  ?General bed mobility comments: up in recliner ?  ? ?Transfers ?Overall transfer level: Needs assistance ?Equipment used: Rolling walker (2 wheels) ?Transfers: Sit to/from Stand ?Sit to Stand: Min guard ?  ?  ?  ?  ?  ?General transfer comment: cues for positioning  close and midline to recliner. Pt reaching back with hand to find armrest. min guard for safety, some uncontrolled descent going into recliner ?  ?  ?Balance Overall balance assessment: Needs assistance ?Sitting-balance support: No upper extremity supported, Feet supported ?Sitting balance-Leahy Scale: Good ?  ?  ?Standing balance support: Bilateral upper extremity supported ?Standing balance-Leahy Scale: Poor ?Standing balance comment: external support in static standing ?  ?  ?  ?  ?  ?  ?  ?  ?  ?  ?  ?   ? ?ADL either performed or assessed with clinical judgement  ? ?ADL Overall ADL's : Needs assistance/impaired ?  ?  ?Grooming: Oral care;Wash/dry face;Wash/dry hands;Min guard;Standing ?  ?  ?  ?  ?  ?  ?  ?Lower Body Dressing: Min guard;Set up;Sit to/from stand ?  ?  ?  ?  ?  ?  ?  ?Functional mobility during ADLs: Rolling walker (2 wheels);Min guard ?General ADL Comments: Pt walked to sink and stood to perform 3 grooming tasks. pt then completed LB dressing in sit<>stand from relciner height ?  ? ?Extremity/Trunk Assessment Upper Extremity Assessment ?Upper Extremity Assessment: Generalized weakness;LUE deficits/detail ?LUE Deficits / Details: baseline hemiparesis ?LUE Coordination: decreased fine motor;decreased gross motor ?  ?Lower Extremity Assessment ?Lower Extremity Assessment: Defer to PT evaluation ?  ?  ?  ? ?Vision   ?  ?  ?Perception   ?  ?Praxis   ?  ? ?Cognition Arousal/Alertness: Awake/alert ?Behavior During Therapy: [facility] for tasks assessed/performed ?Overall Cognitive Status: Impaired/Different from baseline ?Area of Impairment: Safety/judgement, Memory, Problem solving ?  ?  ?  ?  ?  ?  ?  ?  ?  ?  ?Memory: Decreased short-term memory ?  ?  Safety/Judgement: Decreased awareness of deficits ?  ?Problem Solving: Slow processing ?  ?  ?  ?   ?Exercises   ? ?  ?Shoulder Instructions   ? ? ?  ?General Comments    ? ? ?Pertinent Vitals/ Pain       Pain Assessment ?Pain Assessment: No/denies pain ? ?Home  Living   ?  ?  ?  ?  ?  ?  ?  ?  ?  ?  ?  ?  ?  ?  ?  ?  ?  ?  ? ?  ?Prior Functioning/Environment    ?  ?  ?  ?   ? ?Frequency ? Min 2X/week  ? ? ? ? ?  ?Progress Toward Goals ? ?OT Goals(current goals can now be found in the care plan section) ? Progress towards OT goals: Progressing toward goals ? ?Acute Rehab OT Goals ?OT Goal Formulation: With patient ?Time For Goal Achievement: 09/06/21 ?Potential to Achieve Goals: Good ?ADL Goals ?Pt Will Perform Grooming: with supervision;sitting ?Pt Will Perform Upper Body Dressing: with set-up;sitting ?Pt Will Perform Lower Body Dressing: with supervision;sit to/from stand ?Pt Will Transfer to Toilet: with supervision;ambulating ?Pt Will Perform Toileting - Clothing Manipulation and hygiene: with supervision;sit to/from stand  ?Plan Discharge plan remains appropriate   ? ?Co-evaluation ? ? ?   ?  ?  ?  ?  ? ?  ?AM-PAC OT "6 Clicks" Daily Activity     ?Outcome Measure ? ? Help from another person eating meals?: None ?Help from another person taking care of personal grooming?: A Little ?Help from another person toileting, which includes using toliet, bedpan, or urinal?: A Little ?Help from another person bathing (including washing, rinsing, drying)?: A Little ?Help from another person to put on and taking off regular upper body clothing?: A Little ?Help from another person to put on and taking off regular lower body clothing?: A Little ?6 Click Score: 19 ? ?  ?End of Session Equipment Utilized During Treatment: Rolling walker (2 wheels) ? ?OT Visit Diagnosis: Unsteadiness on feet (R26.81);Muscle weakness (generalized) (M62.81);Other symptoms and signs involving cognitive function;Hemiplegia and hemiparesis ?Hemiplegia - Right/Left: Left ?Hemiplegia - dominant/non-dominant: Non-Dominant ?Hemiplegia - caused by: Other cerebrovascular disease ?  ?Activity Tolerance Patient tolerated treatment well ?  ?Patient Left in chair;with call bell/phone within reach;with chair alarm  set;with family/visitor present ?  ?Nurse Communication   ?  ? ?   ? ?Time: [id number] ?OT Time Calculation (min): 22 min ? ?Charges: OT General Charges ?$OT Visit: 1 Visit ?OT Treatments ?$Self Care/Home Management : 8-22 mins ? ?Tyrone Schimke, OT ?Acute Rehabilitation Services ?Office: [phone] ? ? ?Tyrone Schimke H ?08/25/2021, 1:31 PM ?

## 2021-08-25 NOTE — Plan of Care (Signed)

## 2021-08-25 NOTE — Progress Notes (Signed)
Physical Therapy Treatment ?Patient Details ?Name: Nicholas Mckenzie ?MRN: [id number] ?DOB: 1945-09-03 ?Today's Date: 08/25/2021 ? ? ?History of Present Illness 76 y.o. male admitted 2/21 with SOB and tachycardia with possibly choking on pill. Pt with acute respiratory failure. PMhx. 06/06/2021 fall with SDH, CKD stage IIIa, nephrostomy tube, sleep apnea, HTN, OSA, depression/anxiety, CVA with residual left hemiparesis. ? ?  ?PT Comments  ? ? Pt supine on arrival with linens soiled and assist for linen change and pericare. Pt to state need for toileting with successful BM. Pt able to tolerate gait on RA with sats 97% and HR max 102. Pt pleasant and improving gait tolerance and function. Continue to require supervision and assist at home with wife present end of session.  ?   ?Recommendations for follow up therapy are one component of a multi-disciplinary discharge planning process, led by the attending physician.  Recommendations may be updated based on patient status, additional functional criteria and insurance authorization. ? ?Follow Up Recommendations ? Home health PT ?  ?  ?Assistance Recommended at Discharge Intermittent Supervision/Assistance  ?Patient can return home with the following A little help with bathing/dressing/bathroom;A little help with walking and/or transfers;Assist for transportation;Direct supervision/assist for financial management;Direct supervision/assist for medications management;Assistance with cooking/housework ?  ?Equipment Recommendations ? None recommended by PT  ?  ?Recommendations for Other Services   ? ? ?  ?Precautions / Restrictions Precautions ?Precautions: Fall ?Precaution Comments: Rt nephrostomy tube  ?  ? ?Mobility ? Bed Mobility ?Overal bed mobility: Modified Independent ?  ?  ?  ?  ?  ?  ?General bed mobility comments: pt able to transition to sitting without physical assist ?  ? ?Transfers ?Overall transfer level: Needs assistance ?  ?Transfers: Sit to/from Stand, Bed to  chair/wheelchair/BSC ?Sit to Stand: Min guard ?Stand pivot transfers: Min guard ?  ?  ?  ?  ?General transfer comment: cues for hand placement and positioning at surface. pt able to pivot bed to chair with use of RW. Assist for pericare in standing ?  ? ?Ambulation/Gait ?Ambulation/Gait assistance: Min guard ?Gait Distance (Feet): 280 Feet ?Assistive device: Rolling walker (2 wheels) ?Gait Pattern/deviations: Step-through pattern, Decreased stride length, Trunk flexed ?  ?Gait velocity interpretation: 1.31 - 2.62 ft/sec, indicative of limited community ambulator ?  ?General Gait Details: cues for direction to avoid obstacles, cues for proximity to RW and posture ? ? ?Stairs ?  ?  ?  ?  ?  ? ? ?Wheelchair Mobility ?  ? ?Modified Rankin (Stroke Patients Only) ?  ? ? ?  ?Balance Overall balance assessment: Needs assistance ?Sitting-balance support: No upper extremity supported, Feet supported ?Sitting balance-Leahy Scale: Good ?  ?  ?Standing balance support: Bilateral upper extremity supported ?Standing balance-Leahy Scale: Poor ?Standing balance comment: RW for static standing and gait ?  ?  ?  ?  ?  ?  ?  ?  ?  ?  ?  ?  ? ?  ?Cognition Arousal/Alertness: Awake/alert ?Behavior During Therapy: [facility] for tasks assessed/performed ?Overall Cognitive Status: Impaired/Different from baseline ?Area of Impairment: Safety/judgement ?  ?  ?  ?  ?  ?  ?  ?  ?  ?  ?Memory: Decreased short-term memory ?  ?  ?  ?  ?General Comments: pt needs cues to attend to running into objects with gait and for recall of room number. Pt incontinent of urine on arrival, unaware with nephrostomy tube lying on floor ?  ?  ? ?  ?  Exercises   ? ?  ?General Comments   ?  ?  ? ?Pertinent Vitals/Pain Pain Assessment ?Pain Assessment: No/denies pain  ? ? ?Home Living   ?  ?  ?  ?  ?  ?  ?  ?  ?  ?   ?  ?Prior Function    ?  ?  ?   ? ?PT Goals (current goals can now be found in the care plan section) Progress towards PT goals: Progressing toward goals ? ?   ?Frequency ? ? ? Min 3X/week ? ? ? ?  ?PT Plan Current plan remains appropriate  ? ? ?Co-evaluation   ?  ?  ?  ?  ? ?  ?AM-PAC PT "6 Clicks" Mobility   ?Outcome Measure ? Help needed turning from your back to your side while in a flat bed without using bedrails?: None ?Help needed moving from lying on your back to sitting on the side of a flat bed without using bedrails?: A Little ?Help needed moving to and from a bed to a chair (including a wheelchair)?: A Little ?Help needed standing up from a chair using your arms (e.g., wheelchair or bedside chair)?: A Little ?Help needed to walk in hospital room?: A Little ?Help needed climbing 3-5 steps with a railing? : A Lot ?6 Click Score: 18 ? ?  ?End of Session   ?Activity Tolerance: Patient tolerated treatment well ?Patient left: in chair;with call bell/phone within reach;with chair alarm set;with family/visitor present ?Nurse Communication: Mobility status ?PT Visit Diagnosis: Other abnormalities of gait and mobility (R26.89);Difficulty in walking, not elsewhere classified (R26.2);Muscle weakness (generalized) (M62.81) ?  ? ? ?Time: 1002-1040 ?PT Time Calculation (min) (ACUTE ONLY): 38 min ? ?Charges:  $Gait Training: 8-22 mins ?$Therapeutic Activity: 23-37 mins          ?          ? ?Paige Vanderwoude P, PT ?Acute Rehabilitation Services ?Pager: [phone] ?Office: [phone] ? ? ? ?Johnathin Vanderschaaf B Andersson [place] ?08/25/2021, 12:28 PM ? ?

## 2021-08-25 NOTE — Care Management Important Message (Deleted)
Important Message ? ?Patient Details  ?Name: Nicholas Mckenzie ?MRN: [id number] ?Date of Birth: June 21, 1946 ? ? ?Medicare Important Message Given:  Yes ? ? ? ? ?Shelda Altes ?08/25/2021, 1:37 PM ?

## 2021-08-27 DIAGNOSIS — N261 Atrophy of kidney (terminal): Secondary | ICD-10-CM | POA: Diagnosis not present

## 2021-08-27 DIAGNOSIS — I69312 Visuospatial deficit and spatial neglect following cerebral infarction: Secondary | ICD-10-CM | POA: Diagnosis not present

## 2021-08-27 DIAGNOSIS — Z936 Other artificial openings of urinary tract status: Secondary | ICD-10-CM | POA: Diagnosis not present

## 2021-08-27 DIAGNOSIS — Z960 Presence of urogenital implants: Secondary | ICD-10-CM | POA: Diagnosis not present

## 2021-08-27 DIAGNOSIS — I7121 Aneurysm of the ascending aorta, without rupture: Secondary | ICD-10-CM | POA: Diagnosis not present

## 2021-08-27 DIAGNOSIS — F32A Depression, unspecified: Secondary | ICD-10-CM | POA: Diagnosis not present

## 2021-08-27 DIAGNOSIS — D631 Anemia in chronic kidney disease: Secondary | ICD-10-CM | POA: Diagnosis not present

## 2021-08-27 DIAGNOSIS — Z7902 Long term (current) use of antithrombotics/antiplatelets: Secondary | ICD-10-CM | POA: Diagnosis not present

## 2021-08-27 DIAGNOSIS — W19XXXD Unspecified fall, subsequent encounter: Secondary | ICD-10-CM | POA: Diagnosis not present

## 2021-08-27 DIAGNOSIS — Z9181 History of falling: Secondary | ICD-10-CM | POA: Diagnosis not present

## 2021-08-27 DIAGNOSIS — N132 Hydronephrosis with renal and ureteral calculous obstruction: Secondary | ICD-10-CM | POA: Diagnosis not present

## 2021-08-27 DIAGNOSIS — G4733 Obstructive sleep apnea (adult) (pediatric): Secondary | ICD-10-CM | POA: Diagnosis not present

## 2021-08-27 DIAGNOSIS — Z8616 Personal history of COVID-19: Secondary | ICD-10-CM | POA: Diagnosis not present

## 2021-08-27 DIAGNOSIS — Z7982 Long term (current) use of aspirin: Secondary | ICD-10-CM | POA: Diagnosis not present

## 2021-08-27 DIAGNOSIS — N1831 Chronic kidney disease, stage 3a: Secondary | ICD-10-CM | POA: Diagnosis not present

## 2021-08-27 DIAGNOSIS — Z87440 Personal history of urinary (tract) infections: Secondary | ICD-10-CM | POA: Diagnosis not present

## 2021-08-27 DIAGNOSIS — I69354 Hemiplegia and hemiparesis following cerebral infarction affecting left non-dominant side: Secondary | ICD-10-CM | POA: Diagnosis not present

## 2021-08-27 DIAGNOSIS — I129 Hypertensive chronic kidney disease with stage 1 through stage 4 chronic kidney disease, or unspecified chronic kidney disease: Secondary | ICD-10-CM | POA: Diagnosis not present

## 2021-08-27 DIAGNOSIS — S065X0D Traumatic subdural hemorrhage without loss of consciousness, subsequent encounter: Secondary | ICD-10-CM | POA: Diagnosis not present

## 2021-08-28 NOTE — Procedures (Signed)
° °  NAME: Nicholas Mckenzie DATE OF BIRTH:  10-16-1945 MEDICAL RECORD NUMBER [id number]  LOCATION: [facility]  PHYSICIAN: Soila Printup D Tyerra Loretto  DATE OF STUDY: 08/08/2021  SLEEP STUDY TYPE: Nocturnal Polysomnogram               REFERRING PHYSICIAN: Marius Ditch, MD  CLINICAL INFORMATION Nicholas Mckenzie is a 76 year old Male and was referred to the sleep center for evaluation of obstructive sleep apnea (G47.80). Indications include Daytime Fatigue, Depression, Fatigue, Hypertension, Memory Loss, Snoring.  MEDICATIONS Patient self administered medications include: Melatonin. Sleep medicine administered - Melatonin 10 mg at 10:15:00 PM  SLEEP STUDY TECHNIQUE A multi-channel overnight Polysomnography study was performed. The channels recorded and monitored were central and occipital EEG, electrooculogram (EOG), submentalis EMG (chin), nasal and oral airflow, thoracic and abdominal wall motion, anterior tibialis EMG, snore microphone, electrocardiogram, and a pulse oximetry. TECHNICAL COMMENTS Comments added by Technician: Patient was ordered as a Npsg only.  SLEEP ARCHITECTURE The study was initiated at 10:42:42 PM and terminated at 4:42:39 AM. The total recorded time was 360 minutes. EEG confirmed total sleep time was 283 minutes yielding a sleep efficiency of 78.6%. Sleep onset after lights out was 9.4 minutes with a REM latency of 342.0 minutes. The patient spent 40.3% of the night in stage N1 sleep, 56.9% in stage N2 sleep, 0.0% in stage N3 and 2.8% in REM. Wake after sleep onset (WASO) was 67.5 minutes. The Arousal Index was 68.5/hour. RESPIRATORY PARAMETERS There were a total of 353 respiratory disturbances out of which 98 were apneas (94 obstructive, 3 mixed, 1 central) and 255 hypopneas. The apnea/hypopnea index (AHI) was 74.8 events/hour. The central sleep apnea index was 0.2 events/hour. The REM AHI was 60.0 events/hour and NREM AHI was 75.3 events/hour. The supine AHI was 74.8  events/hour and the non supine AHI was 0 supine during 100.00% of sleep. Respiratory disturbances were associated with oxygen desaturation down to a nadir of 63.0% during sleep. The mean oxygen saturation during the study was 89.3%.  LEG MOVEMENT DATA The total leg movements were 0 with a resulting leg movement index of 0.0/hr .Associated arousal with leg movement index was 0.0/hr.  CARDIAC DATA The underlying cardiac rhythm was most consistent with sinus rhythm. Mean heart rate during sleep was 69.7 bpm. Additional rhythm abnormalities include None. IMPRESSIONS - Severe Obstructive Sleep apnea(OSA) DIAGNOSIS - Obstructive Sleep Apnea (G47.33) RECOMMENDATIONS - Therapeutic CPAP titration to determine optimal pressure required to alleviate sleep disordered breathing. - Positional therapy avoiding supine position during sleep. - Avoid alcohol, sedatives and other CNS depressants that may worsen sleep apnea and disrupt normal sleep architecture. - Sleep hygiene should be reviewed to assess factors that may improve sleep quality. - Weight management and regular exercise should be initiated or continued.  Elmarie Mainland, MD Diplomate, American Board of Sleep Medicine  ELECTRONICALLY SIGNED ON:  08/28/2021, 11:16 AM [place] PH: [phone]   FX: [phone] [place]

## 2021-08-30 DIAGNOSIS — D631 Anemia in chronic kidney disease: Secondary | ICD-10-CM | POA: Diagnosis not present

## 2021-08-30 DIAGNOSIS — G4733 Obstructive sleep apnea (adult) (pediatric): Secondary | ICD-10-CM | POA: Diagnosis not present

## 2021-08-30 DIAGNOSIS — S065X0D Traumatic subdural hemorrhage without loss of consciousness, subsequent encounter: Secondary | ICD-10-CM | POA: Diagnosis not present

## 2021-08-30 DIAGNOSIS — F32A Depression, unspecified: Secondary | ICD-10-CM | POA: Diagnosis not present

## 2021-08-30 DIAGNOSIS — J9601 Acute respiratory failure with hypoxia: Secondary | ICD-10-CM | POA: Diagnosis not present

## 2021-08-30 DIAGNOSIS — N261 Atrophy of kidney (terminal): Secondary | ICD-10-CM | POA: Diagnosis not present

## 2021-08-30 DIAGNOSIS — I69312 Visuospatial deficit and spatial neglect following cerebral infarction: Secondary | ICD-10-CM | POA: Diagnosis not present

## 2021-08-30 DIAGNOSIS — R1310 Dysphagia, unspecified: Secondary | ICD-10-CM | POA: Diagnosis not present

## 2021-08-30 DIAGNOSIS — I69354 Hemiplegia and hemiparesis following cerebral infarction affecting left non-dominant side: Secondary | ICD-10-CM | POA: Diagnosis not present

## 2021-08-30 DIAGNOSIS — W19XXXD Unspecified fall, subsequent encounter: Secondary | ICD-10-CM | POA: Diagnosis not present

## 2021-08-30 DIAGNOSIS — Z8616 Personal history of COVID-19: Secondary | ICD-10-CM | POA: Diagnosis not present

## 2021-08-30 DIAGNOSIS — N1831 Chronic kidney disease, stage 3a: Secondary | ICD-10-CM | POA: Diagnosis not present

## 2021-08-30 DIAGNOSIS — E440 Moderate protein-calorie malnutrition: Secondary | ICD-10-CM | POA: Diagnosis not present

## 2021-08-30 DIAGNOSIS — N132 Hydronephrosis with renal and ureteral calculous obstruction: Secondary | ICD-10-CM | POA: Diagnosis not present

## 2021-08-30 DIAGNOSIS — Z936 Other artificial openings of urinary tract status: Secondary | ICD-10-CM | POA: Diagnosis not present

## 2021-08-30 DIAGNOSIS — Z9181 History of falling: Secondary | ICD-10-CM | POA: Diagnosis not present

## 2021-08-30 DIAGNOSIS — I129 Hypertensive chronic kidney disease with stage 1 through stage 4 chronic kidney disease, or unspecified chronic kidney disease: Secondary | ICD-10-CM | POA: Diagnosis not present

## 2021-08-30 DIAGNOSIS — E876 Hypokalemia: Secondary | ICD-10-CM | POA: Diagnosis not present

## 2021-08-30 DIAGNOSIS — J690 Pneumonitis due to inhalation of food and vomit: Secondary | ICD-10-CM | POA: Diagnosis not present

## 2021-08-30 DIAGNOSIS — Z7982 Long term (current) use of aspirin: Secondary | ICD-10-CM | POA: Diagnosis not present

## 2021-08-30 DIAGNOSIS — Z87440 Personal history of urinary (tract) infections: Secondary | ICD-10-CM | POA: Diagnosis not present

## 2021-08-30 DIAGNOSIS — Z960 Presence of urogenital implants: Secondary | ICD-10-CM | POA: Diagnosis not present

## 2021-08-31 DIAGNOSIS — I129 Hypertensive chronic kidney disease with stage 1 through stage 4 chronic kidney disease, or unspecified chronic kidney disease: Secondary | ICD-10-CM | POA: Diagnosis not present

## 2021-08-31 DIAGNOSIS — E440 Moderate protein-calorie malnutrition: Secondary | ICD-10-CM | POA: Diagnosis not present

## 2021-08-31 DIAGNOSIS — R1310 Dysphagia, unspecified: Secondary | ICD-10-CM | POA: Diagnosis not present

## 2021-08-31 DIAGNOSIS — F32A Depression, unspecified: Secondary | ICD-10-CM | POA: Diagnosis not present

## 2021-08-31 DIAGNOSIS — W19XXXD Unspecified fall, subsequent encounter: Secondary | ICD-10-CM | POA: Diagnosis not present

## 2021-08-31 DIAGNOSIS — I69354 Hemiplegia and hemiparesis following cerebral infarction affecting left non-dominant side: Secondary | ICD-10-CM | POA: Diagnosis not present

## 2021-08-31 DIAGNOSIS — Z9181 History of falling: Secondary | ICD-10-CM | POA: Diagnosis not present

## 2021-08-31 DIAGNOSIS — N1831 Chronic kidney disease, stage 3a: Secondary | ICD-10-CM | POA: Diagnosis not present

## 2021-08-31 DIAGNOSIS — J690 Pneumonitis due to inhalation of food and vomit: Secondary | ICD-10-CM | POA: Diagnosis not present

## 2021-08-31 DIAGNOSIS — D631 Anemia in chronic kidney disease: Secondary | ICD-10-CM | POA: Diagnosis not present

## 2021-08-31 DIAGNOSIS — J9601 Acute respiratory failure with hypoxia: Secondary | ICD-10-CM | POA: Diagnosis not present

## 2021-08-31 DIAGNOSIS — Z87440 Personal history of urinary (tract) infections: Secondary | ICD-10-CM | POA: Diagnosis not present

## 2021-08-31 DIAGNOSIS — Z7982 Long term (current) use of aspirin: Secondary | ICD-10-CM | POA: Diagnosis not present

## 2021-08-31 DIAGNOSIS — G4733 Obstructive sleep apnea (adult) (pediatric): Secondary | ICD-10-CM | POA: Diagnosis not present

## 2021-08-31 DIAGNOSIS — E876 Hypokalemia: Secondary | ICD-10-CM | POA: Diagnosis not present

## 2021-08-31 DIAGNOSIS — Z936 Other artificial openings of urinary tract status: Secondary | ICD-10-CM | POA: Diagnosis not present

## 2021-08-31 DIAGNOSIS — Z8616 Personal history of COVID-19: Secondary | ICD-10-CM | POA: Diagnosis not present

## 2021-08-31 DIAGNOSIS — N261 Atrophy of kidney (terminal): Secondary | ICD-10-CM | POA: Diagnosis not present

## 2021-08-31 DIAGNOSIS — S065X0D Traumatic subdural hemorrhage without loss of consciousness, subsequent encounter: Secondary | ICD-10-CM | POA: Diagnosis not present

## 2021-08-31 DIAGNOSIS — Z960 Presence of urogenital implants: Secondary | ICD-10-CM | POA: Diagnosis not present

## 2021-08-31 DIAGNOSIS — N132 Hydronephrosis with renal and ureteral calculous obstruction: Secondary | ICD-10-CM | POA: Diagnosis not present

## 2021-08-31 DIAGNOSIS — I69312 Visuospatial deficit and spatial neglect following cerebral infarction: Secondary | ICD-10-CM | POA: Diagnosis not present

## 2021-09-06 DIAGNOSIS — R1310 Dysphagia, unspecified: Secondary | ICD-10-CM | POA: Diagnosis not present

## 2021-09-06 DIAGNOSIS — F32A Depression, unspecified: Secondary | ICD-10-CM | POA: Diagnosis not present

## 2021-09-06 DIAGNOSIS — Z9181 History of falling: Secondary | ICD-10-CM | POA: Diagnosis not present

## 2021-09-06 DIAGNOSIS — W19XXXD Unspecified fall, subsequent encounter: Secondary | ICD-10-CM | POA: Diagnosis not present

## 2021-09-06 DIAGNOSIS — I69354 Hemiplegia and hemiparesis following cerebral infarction affecting left non-dominant side: Secondary | ICD-10-CM | POA: Diagnosis not present

## 2021-09-06 DIAGNOSIS — J9601 Acute respiratory failure with hypoxia: Secondary | ICD-10-CM | POA: Diagnosis not present

## 2021-09-06 DIAGNOSIS — Z8616 Personal history of COVID-19: Secondary | ICD-10-CM | POA: Diagnosis not present

## 2021-09-06 DIAGNOSIS — S065X0D Traumatic subdural hemorrhage without loss of consciousness, subsequent encounter: Secondary | ICD-10-CM | POA: Diagnosis not present

## 2021-09-06 DIAGNOSIS — N261 Atrophy of kidney (terminal): Secondary | ICD-10-CM | POA: Diagnosis not present

## 2021-09-06 DIAGNOSIS — Z960 Presence of urogenital implants: Secondary | ICD-10-CM | POA: Diagnosis not present

## 2021-09-06 DIAGNOSIS — I129 Hypertensive chronic kidney disease with stage 1 through stage 4 chronic kidney disease, or unspecified chronic kidney disease: Secondary | ICD-10-CM | POA: Diagnosis not present

## 2021-09-06 DIAGNOSIS — N132 Hydronephrosis with renal and ureteral calculous obstruction: Secondary | ICD-10-CM | POA: Diagnosis not present

## 2021-09-06 DIAGNOSIS — Z936 Other artificial openings of urinary tract status: Secondary | ICD-10-CM | POA: Diagnosis not present

## 2021-09-06 DIAGNOSIS — Z7982 Long term (current) use of aspirin: Secondary | ICD-10-CM | POA: Diagnosis not present

## 2021-09-06 DIAGNOSIS — D631 Anemia in chronic kidney disease: Secondary | ICD-10-CM | POA: Diagnosis not present

## 2021-09-06 DIAGNOSIS — I69312 Visuospatial deficit and spatial neglect following cerebral infarction: Secondary | ICD-10-CM | POA: Diagnosis not present

## 2021-09-06 DIAGNOSIS — J690 Pneumonitis due to inhalation of food and vomit: Secondary | ICD-10-CM | POA: Diagnosis not present

## 2021-09-06 DIAGNOSIS — N1831 Chronic kidney disease, stage 3a: Secondary | ICD-10-CM | POA: Diagnosis not present

## 2021-09-06 DIAGNOSIS — E440 Moderate protein-calorie malnutrition: Secondary | ICD-10-CM | POA: Diagnosis not present

## 2021-09-06 DIAGNOSIS — E876 Hypokalemia: Secondary | ICD-10-CM | POA: Diagnosis not present

## 2021-09-06 DIAGNOSIS — G4733 Obstructive sleep apnea (adult) (pediatric): Secondary | ICD-10-CM | POA: Diagnosis not present

## 2021-09-06 DIAGNOSIS — Z87440 Personal history of urinary (tract) infections: Secondary | ICD-10-CM | POA: Diagnosis not present

## 2021-09-07 DIAGNOSIS — N261 Atrophy of kidney (terminal): Secondary | ICD-10-CM | POA: Diagnosis not present

## 2021-09-07 DIAGNOSIS — N200 Calculus of kidney: Secondary | ICD-10-CM | POA: Diagnosis not present

## 2021-09-07 DIAGNOSIS — Z936 Other artificial openings of urinary tract status: Secondary | ICD-10-CM | POA: Diagnosis not present

## 2021-09-07 DIAGNOSIS — N1330 Unspecified hydronephrosis: Secondary | ICD-10-CM | POA: Diagnosis not present

## 2021-09-07 DIAGNOSIS — Z960 Presence of urogenital implants: Secondary | ICD-10-CM | POA: Diagnosis not present

## 2021-09-07 DIAGNOSIS — N1831 Chronic kidney disease, stage 3a: Secondary | ICD-10-CM | POA: Diagnosis not present

## 2021-09-08 DIAGNOSIS — W19XXXD Unspecified fall, subsequent encounter: Secondary | ICD-10-CM | POA: Diagnosis not present

## 2021-09-08 DIAGNOSIS — F32A Depression, unspecified: Secondary | ICD-10-CM | POA: Diagnosis not present

## 2021-09-08 DIAGNOSIS — N261 Atrophy of kidney (terminal): Secondary | ICD-10-CM | POA: Diagnosis not present

## 2021-09-08 DIAGNOSIS — N1831 Chronic kidney disease, stage 3a: Secondary | ICD-10-CM | POA: Diagnosis not present

## 2021-09-08 DIAGNOSIS — I69312 Visuospatial deficit and spatial neglect following cerebral infarction: Secondary | ICD-10-CM | POA: Diagnosis not present

## 2021-09-08 DIAGNOSIS — D631 Anemia in chronic kidney disease: Secondary | ICD-10-CM | POA: Diagnosis not present

## 2021-09-08 DIAGNOSIS — J690 Pneumonitis due to inhalation of food and vomit: Secondary | ICD-10-CM | POA: Diagnosis not present

## 2021-09-08 DIAGNOSIS — Z8616 Personal history of COVID-19: Secondary | ICD-10-CM | POA: Diagnosis not present

## 2021-09-08 DIAGNOSIS — Z87440 Personal history of urinary (tract) infections: Secondary | ICD-10-CM | POA: Diagnosis not present

## 2021-09-08 DIAGNOSIS — S065X0D Traumatic subdural hemorrhage without loss of consciousness, subsequent encounter: Secondary | ICD-10-CM | POA: Diagnosis not present

## 2021-09-08 DIAGNOSIS — J9601 Acute respiratory failure with hypoxia: Secondary | ICD-10-CM | POA: Diagnosis not present

## 2021-09-08 DIAGNOSIS — Z7982 Long term (current) use of aspirin: Secondary | ICD-10-CM | POA: Diagnosis not present

## 2021-09-08 DIAGNOSIS — N132 Hydronephrosis with renal and ureteral calculous obstruction: Secondary | ICD-10-CM | POA: Diagnosis not present

## 2021-09-08 DIAGNOSIS — Z9181 History of falling: Secondary | ICD-10-CM | POA: Diagnosis not present

## 2021-09-08 DIAGNOSIS — Z936 Other artificial openings of urinary tract status: Secondary | ICD-10-CM | POA: Diagnosis not present

## 2021-09-08 DIAGNOSIS — E876 Hypokalemia: Secondary | ICD-10-CM | POA: Diagnosis not present

## 2021-09-08 DIAGNOSIS — I129 Hypertensive chronic kidney disease with stage 1 through stage 4 chronic kidney disease, or unspecified chronic kidney disease: Secondary | ICD-10-CM | POA: Diagnosis not present

## 2021-09-08 DIAGNOSIS — E440 Moderate protein-calorie malnutrition: Secondary | ICD-10-CM | POA: Diagnosis not present

## 2021-09-08 DIAGNOSIS — Z960 Presence of urogenital implants: Secondary | ICD-10-CM | POA: Diagnosis not present

## 2021-09-08 DIAGNOSIS — R1310 Dysphagia, unspecified: Secondary | ICD-10-CM | POA: Diagnosis not present

## 2021-09-08 DIAGNOSIS — G4733 Obstructive sleep apnea (adult) (pediatric): Secondary | ICD-10-CM | POA: Diagnosis not present

## 2021-09-08 DIAGNOSIS — I69354 Hemiplegia and hemiparesis following cerebral infarction affecting left non-dominant side: Secondary | ICD-10-CM | POA: Diagnosis not present

## 2021-09-12 DIAGNOSIS — E876 Hypokalemia: Secondary | ICD-10-CM | POA: Diagnosis not present

## 2021-09-12 DIAGNOSIS — I10 Essential (primary) hypertension: Secondary | ICD-10-CM | POA: Diagnosis not present

## 2021-09-12 DIAGNOSIS — G4733 Obstructive sleep apnea (adult) (pediatric): Secondary | ICD-10-CM | POA: Diagnosis not present

## 2021-09-12 DIAGNOSIS — A419 Sepsis, unspecified organism: Secondary | ICD-10-CM | POA: Diagnosis not present

## 2021-09-12 DIAGNOSIS — J9601 Acute respiratory failure with hypoxia: Secondary | ICD-10-CM | POA: Diagnosis not present

## 2021-09-12 DIAGNOSIS — N1831 Chronic kidney disease, stage 3a: Secondary | ICD-10-CM | POA: Diagnosis not present

## 2021-09-12 DIAGNOSIS — J690 Pneumonitis due to inhalation of food and vomit: Secondary | ICD-10-CM | POA: Diagnosis not present

## 2021-09-12 DIAGNOSIS — Z936 Other artificial openings of urinary tract status: Secondary | ICD-10-CM | POA: Diagnosis not present

## 2021-09-12 DIAGNOSIS — N261 Atrophy of kidney (terminal): Secondary | ICD-10-CM | POA: Diagnosis not present

## 2021-09-14 DIAGNOSIS — N1831 Chronic kidney disease, stage 3a: Secondary | ICD-10-CM | POA: Diagnosis not present

## 2021-09-14 DIAGNOSIS — I69354 Hemiplegia and hemiparesis following cerebral infarction affecting left non-dominant side: Secondary | ICD-10-CM | POA: Diagnosis not present

## 2021-09-14 DIAGNOSIS — N132 Hydronephrosis with renal and ureteral calculous obstruction: Secondary | ICD-10-CM | POA: Diagnosis not present

## 2021-09-14 DIAGNOSIS — Z87440 Personal history of urinary (tract) infections: Secondary | ICD-10-CM | POA: Diagnosis not present

## 2021-09-14 DIAGNOSIS — R1310 Dysphagia, unspecified: Secondary | ICD-10-CM | POA: Diagnosis not present

## 2021-09-14 DIAGNOSIS — Z7982 Long term (current) use of aspirin: Secondary | ICD-10-CM | POA: Diagnosis not present

## 2021-09-14 DIAGNOSIS — Z9181 History of falling: Secondary | ICD-10-CM | POA: Diagnosis not present

## 2021-09-14 DIAGNOSIS — G4733 Obstructive sleep apnea (adult) (pediatric): Secondary | ICD-10-CM | POA: Diagnosis not present

## 2021-09-14 DIAGNOSIS — I129 Hypertensive chronic kidney disease with stage 1 through stage 4 chronic kidney disease, or unspecified chronic kidney disease: Secondary | ICD-10-CM | POA: Diagnosis not present

## 2021-09-14 DIAGNOSIS — D631 Anemia in chronic kidney disease: Secondary | ICD-10-CM | POA: Diagnosis not present

## 2021-09-14 DIAGNOSIS — Z960 Presence of urogenital implants: Secondary | ICD-10-CM | POA: Diagnosis not present

## 2021-09-14 DIAGNOSIS — F32A Depression, unspecified: Secondary | ICD-10-CM | POA: Diagnosis not present

## 2021-09-14 DIAGNOSIS — J9601 Acute respiratory failure with hypoxia: Secondary | ICD-10-CM | POA: Diagnosis not present

## 2021-09-14 DIAGNOSIS — E876 Hypokalemia: Secondary | ICD-10-CM | POA: Diagnosis not present

## 2021-09-14 DIAGNOSIS — E440 Moderate protein-calorie malnutrition: Secondary | ICD-10-CM | POA: Diagnosis not present

## 2021-09-14 DIAGNOSIS — I69312 Visuospatial deficit and spatial neglect following cerebral infarction: Secondary | ICD-10-CM | POA: Diagnosis not present

## 2021-09-14 DIAGNOSIS — W19XXXD Unspecified fall, subsequent encounter: Secondary | ICD-10-CM | POA: Diagnosis not present

## 2021-09-14 DIAGNOSIS — Z8616 Personal history of COVID-19: Secondary | ICD-10-CM | POA: Diagnosis not present

## 2021-09-14 DIAGNOSIS — Z936 Other artificial openings of urinary tract status: Secondary | ICD-10-CM | POA: Diagnosis not present

## 2021-09-14 DIAGNOSIS — S065X0D Traumatic subdural hemorrhage without loss of consciousness, subsequent encounter: Secondary | ICD-10-CM | POA: Diagnosis not present

## 2021-09-14 DIAGNOSIS — N261 Atrophy of kidney (terminal): Secondary | ICD-10-CM | POA: Diagnosis not present

## 2021-09-14 DIAGNOSIS — J690 Pneumonitis due to inhalation of food and vomit: Secondary | ICD-10-CM | POA: Diagnosis not present

## 2021-09-15 DIAGNOSIS — J690 Pneumonitis due to inhalation of food and vomit: Secondary | ICD-10-CM | POA: Diagnosis not present

## 2021-09-15 DIAGNOSIS — F32A Depression, unspecified: Secondary | ICD-10-CM | POA: Diagnosis not present

## 2021-09-15 DIAGNOSIS — N261 Atrophy of kidney (terminal): Secondary | ICD-10-CM | POA: Diagnosis not present

## 2021-09-15 DIAGNOSIS — E440 Moderate protein-calorie malnutrition: Secondary | ICD-10-CM | POA: Diagnosis not present

## 2021-09-15 DIAGNOSIS — Z960 Presence of urogenital implants: Secondary | ICD-10-CM | POA: Diagnosis not present

## 2021-09-15 DIAGNOSIS — I69354 Hemiplegia and hemiparesis following cerebral infarction affecting left non-dominant side: Secondary | ICD-10-CM | POA: Diagnosis not present

## 2021-09-15 DIAGNOSIS — Z7982 Long term (current) use of aspirin: Secondary | ICD-10-CM | POA: Diagnosis not present

## 2021-09-15 DIAGNOSIS — E876 Hypokalemia: Secondary | ICD-10-CM | POA: Diagnosis not present

## 2021-09-15 DIAGNOSIS — Z8616 Personal history of COVID-19: Secondary | ICD-10-CM | POA: Diagnosis not present

## 2021-09-15 DIAGNOSIS — D631 Anemia in chronic kidney disease: Secondary | ICD-10-CM | POA: Diagnosis not present

## 2021-09-15 DIAGNOSIS — Z9181 History of falling: Secondary | ICD-10-CM | POA: Diagnosis not present

## 2021-09-15 DIAGNOSIS — I129 Hypertensive chronic kidney disease with stage 1 through stage 4 chronic kidney disease, or unspecified chronic kidney disease: Secondary | ICD-10-CM | POA: Diagnosis not present

## 2021-09-15 DIAGNOSIS — G4733 Obstructive sleep apnea (adult) (pediatric): Secondary | ICD-10-CM | POA: Diagnosis not present

## 2021-09-15 DIAGNOSIS — N1831 Chronic kidney disease, stage 3a: Secondary | ICD-10-CM | POA: Diagnosis not present

## 2021-09-15 DIAGNOSIS — Z936 Other artificial openings of urinary tract status: Secondary | ICD-10-CM | POA: Diagnosis not present

## 2021-09-15 DIAGNOSIS — Z87440 Personal history of urinary (tract) infections: Secondary | ICD-10-CM | POA: Diagnosis not present

## 2021-09-15 DIAGNOSIS — W19XXXD Unspecified fall, subsequent encounter: Secondary | ICD-10-CM | POA: Diagnosis not present

## 2021-09-15 DIAGNOSIS — N132 Hydronephrosis with renal and ureteral calculous obstruction: Secondary | ICD-10-CM | POA: Diagnosis not present

## 2021-09-15 DIAGNOSIS — J9601 Acute respiratory failure with hypoxia: Secondary | ICD-10-CM | POA: Diagnosis not present

## 2021-09-15 DIAGNOSIS — S065X0D Traumatic subdural hemorrhage without loss of consciousness, subsequent encounter: Secondary | ICD-10-CM | POA: Diagnosis not present

## 2021-09-15 DIAGNOSIS — R1310 Dysphagia, unspecified: Secondary | ICD-10-CM | POA: Diagnosis not present

## 2021-09-15 DIAGNOSIS — I69312 Visuospatial deficit and spatial neglect following cerebral infarction: Secondary | ICD-10-CM | POA: Diagnosis not present

## 2021-09-19 DIAGNOSIS — N132 Hydronephrosis with renal and ureteral calculous obstruction: Secondary | ICD-10-CM | POA: Diagnosis not present

## 2021-09-19 DIAGNOSIS — Z87440 Personal history of urinary (tract) infections: Secondary | ICD-10-CM | POA: Diagnosis not present

## 2021-09-19 DIAGNOSIS — J690 Pneumonitis due to inhalation of food and vomit: Secondary | ICD-10-CM | POA: Diagnosis not present

## 2021-09-19 DIAGNOSIS — I69354 Hemiplegia and hemiparesis following cerebral infarction affecting left non-dominant side: Secondary | ICD-10-CM | POA: Diagnosis not present

## 2021-09-19 DIAGNOSIS — D631 Anemia in chronic kidney disease: Secondary | ICD-10-CM | POA: Diagnosis not present

## 2021-09-19 DIAGNOSIS — Z960 Presence of urogenital implants: Secondary | ICD-10-CM | POA: Diagnosis not present

## 2021-09-19 DIAGNOSIS — F32A Depression, unspecified: Secondary | ICD-10-CM | POA: Diagnosis not present

## 2021-09-19 DIAGNOSIS — N1831 Chronic kidney disease, stage 3a: Secondary | ICD-10-CM | POA: Diagnosis not present

## 2021-09-19 DIAGNOSIS — E876 Hypokalemia: Secondary | ICD-10-CM | POA: Diagnosis not present

## 2021-09-19 DIAGNOSIS — S065X0D Traumatic subdural hemorrhage without loss of consciousness, subsequent encounter: Secondary | ICD-10-CM | POA: Diagnosis not present

## 2021-09-19 DIAGNOSIS — N261 Atrophy of kidney (terminal): Secondary | ICD-10-CM | POA: Diagnosis not present

## 2021-09-19 DIAGNOSIS — I69312 Visuospatial deficit and spatial neglect following cerebral infarction: Secondary | ICD-10-CM | POA: Diagnosis not present

## 2021-09-19 DIAGNOSIS — J9601 Acute respiratory failure with hypoxia: Secondary | ICD-10-CM | POA: Diagnosis not present

## 2021-09-19 DIAGNOSIS — R1310 Dysphagia, unspecified: Secondary | ICD-10-CM | POA: Diagnosis not present

## 2021-09-19 DIAGNOSIS — W19XXXD Unspecified fall, subsequent encounter: Secondary | ICD-10-CM | POA: Diagnosis not present

## 2021-09-19 DIAGNOSIS — Z8616 Personal history of COVID-19: Secondary | ICD-10-CM | POA: Diagnosis not present

## 2021-09-19 DIAGNOSIS — E440 Moderate protein-calorie malnutrition: Secondary | ICD-10-CM | POA: Diagnosis not present

## 2021-09-19 DIAGNOSIS — I129 Hypertensive chronic kidney disease with stage 1 through stage 4 chronic kidney disease, or unspecified chronic kidney disease: Secondary | ICD-10-CM | POA: Diagnosis not present

## 2021-09-19 DIAGNOSIS — G4733 Obstructive sleep apnea (adult) (pediatric): Secondary | ICD-10-CM | POA: Diagnosis not present

## 2021-09-19 DIAGNOSIS — Z9181 History of falling: Secondary | ICD-10-CM | POA: Diagnosis not present

## 2021-09-19 DIAGNOSIS — Z7982 Long term (current) use of aspirin: Secondary | ICD-10-CM | POA: Diagnosis not present

## 2021-09-19 DIAGNOSIS — Z936 Other artificial openings of urinary tract status: Secondary | ICD-10-CM | POA: Diagnosis not present

## 2021-09-20 DIAGNOSIS — Z936 Other artificial openings of urinary tract status: Secondary | ICD-10-CM | POA: Diagnosis not present

## 2021-09-20 DIAGNOSIS — I69354 Hemiplegia and hemiparesis following cerebral infarction affecting left non-dominant side: Secondary | ICD-10-CM | POA: Diagnosis not present

## 2021-09-20 DIAGNOSIS — D631 Anemia in chronic kidney disease: Secondary | ICD-10-CM | POA: Diagnosis not present

## 2021-09-20 DIAGNOSIS — J9601 Acute respiratory failure with hypoxia: Secondary | ICD-10-CM | POA: Diagnosis not present

## 2021-09-20 DIAGNOSIS — Z8616 Personal history of COVID-19: Secondary | ICD-10-CM | POA: Diagnosis not present

## 2021-09-20 DIAGNOSIS — Z7982 Long term (current) use of aspirin: Secondary | ICD-10-CM | POA: Diagnosis not present

## 2021-09-20 DIAGNOSIS — Z960 Presence of urogenital implants: Secondary | ICD-10-CM | POA: Diagnosis not present

## 2021-09-20 DIAGNOSIS — N132 Hydronephrosis with renal and ureteral calculous obstruction: Secondary | ICD-10-CM | POA: Diagnosis not present

## 2021-09-20 DIAGNOSIS — N261 Atrophy of kidney (terminal): Secondary | ICD-10-CM | POA: Diagnosis not present

## 2021-09-20 DIAGNOSIS — Z87440 Personal history of urinary (tract) infections: Secondary | ICD-10-CM | POA: Diagnosis not present

## 2021-09-20 DIAGNOSIS — W19XXXD Unspecified fall, subsequent encounter: Secondary | ICD-10-CM | POA: Diagnosis not present

## 2021-09-20 DIAGNOSIS — F32A Depression, unspecified: Secondary | ICD-10-CM | POA: Diagnosis not present

## 2021-09-20 DIAGNOSIS — N1831 Chronic kidney disease, stage 3a: Secondary | ICD-10-CM | POA: Diagnosis not present

## 2021-09-20 DIAGNOSIS — I129 Hypertensive chronic kidney disease with stage 1 through stage 4 chronic kidney disease, or unspecified chronic kidney disease: Secondary | ICD-10-CM | POA: Diagnosis not present

## 2021-09-20 DIAGNOSIS — S065X0D Traumatic subdural hemorrhage without loss of consciousness, subsequent encounter: Secondary | ICD-10-CM | POA: Diagnosis not present

## 2021-09-20 DIAGNOSIS — J690 Pneumonitis due to inhalation of food and vomit: Secondary | ICD-10-CM | POA: Diagnosis not present

## 2021-09-20 DIAGNOSIS — Z9181 History of falling: Secondary | ICD-10-CM | POA: Diagnosis not present

## 2021-09-20 DIAGNOSIS — I69312 Visuospatial deficit and spatial neglect following cerebral infarction: Secondary | ICD-10-CM | POA: Diagnosis not present

## 2021-09-20 DIAGNOSIS — G4733 Obstructive sleep apnea (adult) (pediatric): Secondary | ICD-10-CM | POA: Diagnosis not present

## 2021-09-20 DIAGNOSIS — R1310 Dysphagia, unspecified: Secondary | ICD-10-CM | POA: Diagnosis not present

## 2021-09-20 DIAGNOSIS — E876 Hypokalemia: Secondary | ICD-10-CM | POA: Diagnosis not present

## 2021-09-20 DIAGNOSIS — E440 Moderate protein-calorie malnutrition: Secondary | ICD-10-CM | POA: Diagnosis not present

## 2021-09-22 DIAGNOSIS — E876 Hypokalemia: Secondary | ICD-10-CM | POA: Diagnosis not present

## 2021-09-22 DIAGNOSIS — R1310 Dysphagia, unspecified: Secondary | ICD-10-CM | POA: Diagnosis not present

## 2021-09-22 DIAGNOSIS — N1831 Chronic kidney disease, stage 3a: Secondary | ICD-10-CM | POA: Diagnosis not present

## 2021-09-22 DIAGNOSIS — I129 Hypertensive chronic kidney disease with stage 1 through stage 4 chronic kidney disease, or unspecified chronic kidney disease: Secondary | ICD-10-CM | POA: Diagnosis not present

## 2021-09-22 DIAGNOSIS — J9601 Acute respiratory failure with hypoxia: Secondary | ICD-10-CM | POA: Diagnosis not present

## 2021-09-22 DIAGNOSIS — D631 Anemia in chronic kidney disease: Secondary | ICD-10-CM | POA: Diagnosis not present

## 2021-09-22 DIAGNOSIS — G4733 Obstructive sleep apnea (adult) (pediatric): Secondary | ICD-10-CM | POA: Diagnosis not present

## 2021-09-22 DIAGNOSIS — J690 Pneumonitis due to inhalation of food and vomit: Secondary | ICD-10-CM | POA: Diagnosis not present

## 2021-09-22 DIAGNOSIS — Z87440 Personal history of urinary (tract) infections: Secondary | ICD-10-CM | POA: Diagnosis not present

## 2021-09-22 DIAGNOSIS — N132 Hydronephrosis with renal and ureteral calculous obstruction: Secondary | ICD-10-CM | POA: Diagnosis not present

## 2021-09-22 DIAGNOSIS — Z7982 Long term (current) use of aspirin: Secondary | ICD-10-CM | POA: Diagnosis not present

## 2021-09-22 DIAGNOSIS — E440 Moderate protein-calorie malnutrition: Secondary | ICD-10-CM | POA: Diagnosis not present

## 2021-09-22 DIAGNOSIS — F32A Depression, unspecified: Secondary | ICD-10-CM | POA: Diagnosis not present

## 2021-09-22 DIAGNOSIS — I69312 Visuospatial deficit and spatial neglect following cerebral infarction: Secondary | ICD-10-CM | POA: Diagnosis not present

## 2021-09-22 DIAGNOSIS — Z960 Presence of urogenital implants: Secondary | ICD-10-CM | POA: Diagnosis not present

## 2021-09-22 DIAGNOSIS — Z9181 History of falling: Secondary | ICD-10-CM | POA: Diagnosis not present

## 2021-09-22 DIAGNOSIS — Z936 Other artificial openings of urinary tract status: Secondary | ICD-10-CM | POA: Diagnosis not present

## 2021-09-22 DIAGNOSIS — Z8616 Personal history of COVID-19: Secondary | ICD-10-CM | POA: Diagnosis not present

## 2021-09-22 DIAGNOSIS — I69354 Hemiplegia and hemiparesis following cerebral infarction affecting left non-dominant side: Secondary | ICD-10-CM | POA: Diagnosis not present

## 2021-09-22 DIAGNOSIS — W19XXXD Unspecified fall, subsequent encounter: Secondary | ICD-10-CM | POA: Diagnosis not present

## 2021-09-22 DIAGNOSIS — S065X0D Traumatic subdural hemorrhage without loss of consciousness, subsequent encounter: Secondary | ICD-10-CM | POA: Diagnosis not present

## 2021-09-22 DIAGNOSIS — N261 Atrophy of kidney (terminal): Secondary | ICD-10-CM | POA: Diagnosis not present

## 2021-10-09 DIAGNOSIS — I129 Hypertensive chronic kidney disease with stage 1 through stage 4 chronic kidney disease, or unspecified chronic kidney disease: Secondary | ICD-10-CM | POA: Diagnosis not present

## 2021-10-09 DIAGNOSIS — N1831 Chronic kidney disease, stage 3a: Secondary | ICD-10-CM | POA: Diagnosis not present

## 2021-10-09 DIAGNOSIS — I69354 Hemiplegia and hemiparesis following cerebral infarction affecting left non-dominant side: Secondary | ICD-10-CM | POA: Diagnosis not present

## 2021-10-09 DIAGNOSIS — G4733 Obstructive sleep apnea (adult) (pediatric): Secondary | ICD-10-CM | POA: Diagnosis not present

## 2021-10-09 DIAGNOSIS — E440 Moderate protein-calorie malnutrition: Secondary | ICD-10-CM | POA: Diagnosis not present

## 2021-10-09 DIAGNOSIS — Z9181 History of falling: Secondary | ICD-10-CM | POA: Diagnosis not present

## 2021-10-09 DIAGNOSIS — E669 Obesity, unspecified: Secondary | ICD-10-CM | POA: Diagnosis not present

## 2021-10-09 DIAGNOSIS — E876 Hypokalemia: Secondary | ICD-10-CM | POA: Diagnosis not present

## 2021-10-09 DIAGNOSIS — Z993 Dependence on wheelchair: Secondary | ICD-10-CM | POA: Diagnosis not present

## 2021-10-10 ENCOUNTER — Inpatient Hospital Stay (HOSPITAL_COMMUNITY): Admit: 2021-10-10 | Payer: Medicare Other

## 2021-10-13 ENCOUNTER — Telehealth: Payer: Self-pay | Admitting: Adult Health

## 2021-10-13 NOTE — Telephone Encounter (Signed)
Pt's wife, Earley Pock called with pt's current insurance information: ? ? ?[employer] PPO ?Member ID: [id number] ?Group # [id number] ?PCN# [id number] ?

## 2021-10-13 NOTE — Telephone Encounter (Signed)
Pt's wife, Garrus Rolley (on [place]) Requesting changing neurologist to Dr. Jaynee Eagles. ?because Dr. Jaynee Eagles came highly recommended. Did not appreciate Jessica,NP calling the surgeon to get him back on the medications that contributed to his stroke and issues.Daughter decided to cancel appt on 10/19/21 ? ? ? ?

## 2021-10-16 NOTE — Telephone Encounter (Signed)
Forwarded to Dr. Lucia Gaskins as well.  ? ?Dr. Lucia Gaskins,  ? 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 Vascular risk factors include diffuse intracranial stenosis (R P3 occlusion, severe stenosis P2/P3 junction, L V4 mild/mod stenosis, L M1 severe, near occlusive stenosis, R A1 moderate stenosis and arthrosclerotic plaque b/l ICAs), R VA occlusion, prior strokes on imaging, and multiple vascular risk factors.  He was discharged on aspirin and Plavix for 3 months.  After discharge home, he had 3 falls at home with residual traumatic trace SDH 06/06/2021.  Aspirin/Plavix held until neurology follow-up. He was seen in December for follow-up.  Wife reported asa/plavix held until kidney surgery which was tentatively scheduled in March but encouraged to further discuss this with his urologist as he would be at high risk for recurrent stroke off both aspirin/Plavix. Repeat CTH showed resolution of prior trace SDH.  Sent OV note to his prior urologist regarding restarting aspirin/Plavix, unsure of outcome.  ? ?Not sure exactly what the wife is speaking of regarding wanting him back on the medications that contributed to his stroke but either way, looks like they cancelled f/u visit with me.  They have not yet seen Dr. Pearlean Brownie in office, maybe having them f/u with him would be more appropriate unless you agree to see him.  ?

## 2021-10-16 NOTE — Telephone Encounter (Signed)
Noted  

## 2021-10-17 DIAGNOSIS — N200 Calculus of kidney: Secondary | ICD-10-CM | POA: Diagnosis not present

## 2021-10-17 DIAGNOSIS — N1330 Unspecified hydronephrosis: Secondary | ICD-10-CM | POA: Diagnosis not present

## 2021-10-17 DIAGNOSIS — Z960 Presence of urogenital implants: Secondary | ICD-10-CM | POA: Diagnosis not present

## 2021-10-17 DIAGNOSIS — Z936 Other artificial openings of urinary tract status: Secondary | ICD-10-CM | POA: Diagnosis not present

## 2021-10-17 NOTE — Telephone Encounter (Signed)
Spoke with pt's wife, Malakhi Kibbey to schedule pt's follow up appt with Dr. Leonie Man. Have scheduled 01/31/22 at 1:00 pm ?

## 2021-10-19 ENCOUNTER — Ambulatory Visit: Payer: Medicare Other | Admitting: Adult Health

## 2021-10-31 DIAGNOSIS — N200 Calculus of kidney: Secondary | ICD-10-CM | POA: Diagnosis not present

## 2021-10-31 DIAGNOSIS — N210 Calculus in bladder: Secondary | ICD-10-CM | POA: Diagnosis not present

## 2021-10-31 DIAGNOSIS — N281 Cyst of kidney, acquired: Secondary | ICD-10-CM | POA: Diagnosis not present

## 2021-10-31 DIAGNOSIS — Z960 Presence of urogenital implants: Secondary | ICD-10-CM | POA: Diagnosis not present

## 2021-10-31 DIAGNOSIS — N1330 Unspecified hydronephrosis: Secondary | ICD-10-CM | POA: Diagnosis not present

## 2021-10-31 DIAGNOSIS — N132 Hydronephrosis with renal and ureteral calculous obstruction: Secondary | ICD-10-CM | POA: Diagnosis not present

## 2021-10-31 DIAGNOSIS — Z936 Other artificial openings of urinary tract status: Secondary | ICD-10-CM | POA: Diagnosis not present

## 2021-11-06 ENCOUNTER — Ambulatory Visit: Payer: Medicare PPO | Admitting: Speech Pathology

## 2021-11-06 ENCOUNTER — Ambulatory Visit: Payer: Medicare PPO | Attending: Internal Medicine | Admitting: Physical Therapy

## 2021-11-06 ENCOUNTER — Other Ambulatory Visit: Payer: Self-pay

## 2021-11-06 ENCOUNTER — Encounter: Payer: Self-pay | Admitting: Physical Therapy

## 2021-11-06 VITALS — BP 147/88 | HR 64

## 2021-11-06 DIAGNOSIS — R278 Other lack of coordination: Secondary | ICD-10-CM | POA: Insufficient documentation

## 2021-11-06 DIAGNOSIS — R41840 Attention and concentration deficit: Secondary | ICD-10-CM | POA: Diagnosis not present

## 2021-11-06 DIAGNOSIS — R41842 Visuospatial deficit: Secondary | ICD-10-CM | POA: Insufficient documentation

## 2021-11-06 DIAGNOSIS — R2681 Unsteadiness on feet: Secondary | ICD-10-CM | POA: Diagnosis not present

## 2021-11-06 DIAGNOSIS — R2689 Other abnormalities of gait and mobility: Secondary | ICD-10-CM | POA: Diagnosis not present

## 2021-11-06 DIAGNOSIS — R41844 Frontal lobe and executive function deficit: Secondary | ICD-10-CM | POA: Insufficient documentation

## 2021-11-06 DIAGNOSIS — M6281 Muscle weakness (generalized): Secondary | ICD-10-CM | POA: Insufficient documentation

## 2021-11-06 DIAGNOSIS — R471 Dysarthria and anarthria: Secondary | ICD-10-CM | POA: Diagnosis present

## 2021-11-06 DIAGNOSIS — Z9181 History of falling: Secondary | ICD-10-CM | POA: Diagnosis not present

## 2021-11-06 DIAGNOSIS — R41841 Cognitive communication deficit: Secondary | ICD-10-CM | POA: Diagnosis not present

## 2021-11-06 NOTE — Therapy (Addendum)
?OUTPATIENT SPEECH LANGUAGE PATHOLOGY SPEECH AND COGNITIVE EVALUATION ? ? ?Patient Name: Nicholas Mckenzie ?MRN: [id number] ?DOB:1946-06-10, 76 y.o., male ?Today's Date: 11/06/2021 ? ?PCP: Nicholas Bowl, MD  ?REFERRING PROVIDER: Ollen Bowl, MD  ? ? End of Session - 11/06/21 1510   ? ? Visit Number 1   ? Number of Visits 25   ? Date for SLP Re-Evaluation 01/29/22   ? SLP Start Time 0845   ? SLP Stop Time  0930   ? SLP Time Calculation (min) 45 min   ? ?  ?  ? ?  ? ? ?Past Medical History:  ?Diagnosis Date  ? CKD (chronic kidney disease) stage 3, GFR 30-59 ml/min (HCC)   ? Hypertension   ? Kidney stones   ? Sleep apnea   ? ?Past Surgical History:  ?Procedure Laterality Date  ? CATARACT EXTRACTION    ? IR NEPHROSTOMY EXCHANGE RIGHT  06/07/2021  ? IR NEPHROSTOMY EXCHANGE RIGHT  08/22/2021  ? IR NEPHROSTOMY PLACEMENT RIGHT  04/07/2021  ? IR PATIENT EVAL TECH 0-60 MINS  06/02/2021  ? kidney stent    ? MANDIBLE FRACTURE SURGERY    ? ?Patient Active Problem List  ? Diagnosis Date Noted  ? Hypernatremia 08/23/2021  ? Malnutrition of moderate degree 08/17/2021  ? Acute respiratory failure with hypoxia (HCC) 08/16/2021  ? Severe sepsis (HCC) 08/16/2021  ? Sore throat 08/16/2021  ? Hx of subdural hematoma 08/16/2021  ? Nephrolithiasis 08/16/2021  ? Stage 3a chronic kidney disease (CKD) (HCC) 08/16/2021  ? Obesity (BMI 30-39.9) 08/16/2021  ? OSA (obstructive sleep apnea) 08/16/2021  ? Aspiration pneumonia (HCC) 08/15/2021  ? Snoring 08/08/2021  ? Frequent urination 06/07/2021  ? Acute lower UTI 06/07/2021  ? Ascending aortic aneurysm (HCC) 06/07/2021  ? Atrophy of right kidney 06/07/2021  ? Anemia 06/07/2021  ? Obesity (BMI 30.0-34.9) 06/07/2021  ? Subdural hematoma (HCC) 06/06/2021  ? COVID-19 virus infection 06/06/2021  ? Hypokalemia 06/06/2021  ? H/O ischemic right PCA stroke 06/06/2021  ? History of cardioembolic cerebrovascular accident (CVA) 06/06/2021  ? Right-sided cerebrovascular accident (CVA) (HCC) 04/14/2021  ?  Nonfunctioning kidney 04/14/2021  ? Goals of care, counseling/discussion 04/03/2021  ? Ureteral stent occlusion (HCC) 04/02/2021  ? CKD (chronic kidney disease) stage 3a 03/11/2021  ? Major depressive disorder, recurrent episode, in partial remission (HCC) 03/11/2021  ? Hypertension   ? Sleep apnea   ? GAD (generalized anxiety disorder) 07/20/2019  ? ? ?ONSET DATE: 08/16/21  ? ?REFERRING DIAG: Z67.73 (ICD-10-CM) - Personal history of transient ischemic attack (TIA), and cerebral infarction without residual deficits  ? ?THERAPY DIAG:  ?Cognitive communication ?Dysarthria and Anarthria ? ?SUBJECTIVE:  ? ?SUBJECTIVE STATEMENT: ?"Where's my coffee and cream and sugar" ?Pt accompanied by: significant other ?Spouse Nicholas Mckenzie ?PERTINENT HISTORY: Pt is a 76 yo male adm to [facility] after concern for aspiration event with pill.  Pt CT chest showed "Interval finding of mildly nodular and ground-glass airspace  disease in the right lung base with debris and or fluid within the  bronchi, given clear lung bases on the CT performed earlier today,  suspect that findings could be secondary to aspiration."  Swallow eval ordered. Pertinent hx includes SDH 05/2021, Chronic lacunar infarcts within the right basal ganglia, Brain imaging 10/22 cortical/subcortical infarct within the  right parietooccipital lobes and callosal splenium (right PCA  vascular territory), old right occipital cva, delirium and COVID  ? ?PAIN:  ?Are you having pain? No ? ?FALLS: Has patient fallen in last 6 months?  Yes, Number of falls: many ? ?LIVING ENVIRONMENT: ?Lives with: lives with their spouse ?Lives in: House/apartment ? ?PLOF:  ?Level of assistance: Needed assistance with ADLs, Needed assistance with IADLS ?Employment: Retired ? ? ?PATIENT GOALS "To have Nicholas Mckenzie feel comfortable leaving him home alone for a short time" ? ?OBJECTIVE:  ? ?DIAGNOSTIC FINDINGS:   Patient demonstrating a significantly improved swallow function as compared to MBS on 2/22. He exhibited  mildly delayed oral transit of puree, nectar and thin liquid consistencies and mildly impaired mastication of dysphagia 2 solids. During pharyngeal phase, patient exhibited swallow initiation delay at level of vallecular sinus with nectar thick liquids and teaspoon sip thin liquids and swallow initiation delay at level of pyriform sinus with thin liquids via cup and straw sips. When taking larger cup sip and straw sip, patient with trace flash penetration of thin liquids which was well above vocal cords and fully cleared. Barium tablet taken with puree solids transited pharyngeally without difficulty and esophageal sweep did not reveal any s/s dysmotility or stasis. SLP is recommending to initiate Dys 2 solids, thin liquids diet at this time and will follow for diet toleration and ability to trial upgraded solids. 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 He is likely back at his baseline level of swallow due to his decreased edema from time. Continue dys2/thin for now  ?  ?Swallow Evaluation Recommendations ?  ?   ?  ? SLP Diet Recommendations: Dysphagia 2 (Fine chop) solids;Thin liquid ?  ? Liquid Administration via: Cup;Straw ?  ? Medication Administration: Whole meds with puree ?  ? Supervision: Patient able to self feed;Full supervision/cueing for compensatory strategies ?  ? Compensations: Minimize environmental distractions;Slow rate;Small sips/bites;Other (Comment) (intermittent cue to "swallow") ? ?Oral Motor - left lingual numbness, uvulectomy 10 years ago ? ? ?COGNITION: ?Overall cognitive status: Impaired: Attention: Impaired: Selective, Memory: Impaired: Working ?Short term ?Auditory ?Visual, and Awareness: Impaired: Intellectual ? ?CLQT: Initiated - Language WFL, memory - moderate impairment. Attention - mild to moderate impairment. Will complete CLQT next 1-2 sessions and score visuospatial,  attention and executive functions ? ? ? ? PATIENT REPORTED OUTCOME MEASURES (PROM): ?Communication Effectiveness Survey: 25/32, most difficulty conversing at a distance, using telephone and in noisy environments. After they stated they wanted to work on speech, further questioning revealed cognitive impairments. Consider cognitive PROM as needed next few sessions ? ? ?TODAY'S TREATMENT:  ? ? ? ?PATIENT EDUCATION: ?Education details: areas of impairment, cognitive activities to do at home ?Person educated: Patient and Spouse ?Education method: Explanation, Verbal cues, and Handouts ?Education comprehension: verbal cues required and needs further education ? ? ?ASSESSMENT: ? ?CLINICAL IMPRESSION: ?Patient is a 76 y.o. MALE who was seen today for speech and cognition. Jahmere is accompanied by his spouse, Nicholas Mckenzie. They initially were concerned about carrying over speech strategies which HHST implemented. Today, Gunnarr is 100% intelligible with good volume, clear phonatoin and no slur. Further questioning revealed Nicholas Mckenzie is concerned about Vonzell's cognition. She will not leave him home alone for even brief periods due to fear of falling and poor safety awareness. At this time, Nicholas Mckenzie is managing all of Miranda's appointments, medications, and providing A with ADL's.  Nicholas Mckenzie would like to return to work part time but has not been able to do this since Saliou's CVA. At this time, Cortrell is not recalling pertinent information and is not helping out with  household chores. Nicholas Mckenzie relayed that Kacen insisted on jumping the truck's battery, which she asked him not to do. This resulted in a fall in the driveway. Raiyan requires cues for intellectual awareness of cognitive impairments, stating that he is "doing fine, no troubles". On the CLQT, Devontay was distractable having to be redirected to symbol cancellation, clock drawing and generative naming tasks. I recommend skilled ST to maximize cognition and train in compensatory strategies for cognition for  safety and to reduce caregiver burden.  ? ?OBJECTIVE IMPAIRMENTS include attention, memory, awareness, and executive functioning. These impairments are limiting patient from managing medications, managing appoint

## 2021-11-06 NOTE — Patient Instructions (Signed)
?  3 ring binder with sections for PT, OT, ST and calendar ? ?Neuropsychologist ? ?Checkers ?Chess ?Connect 4 ?Uno  ?Card games ?Jig saw puzzles ?Easy cross words ?Memory match ?Board games ?Dominoes ?Avenel ?Learn a new game! ? ?Listen to and discuss [place] or Podcasts ?Read and discuss short articles of interest to you- Take notes on these if memory is a challenge ?Discuss social media posts ?Look and discuss photo albums ? ?The best activities to improve cognition are functional, real life activities that are important to you: ? ?Plan a menu ?Participate in household chores and decisions (with supervision) ?Participate in managing finances ?Plan a party, trip or tailgate with all of the details (even if you aren't really going to carry it out) ?Participate in your hobby as you are able with assistance ?Manage your texts, emails with supervision if needed. ?Google search for items (even if you're not really going to buy anything) and compare prices and features ?Socialize -  however, too many visitors can be overwhelming, so set limits "My doctor said I should only visit (or talk) for 20 minutes" or "I do better when I visit with just 1-2 people at a time for 20 minutes" ? ? ? ?It's good to use real in-person games, not just apps ? ?Apps: ? ?NeuroHQ ?Elevate ?There are apps for most of the games listed above ? ?

## 2021-11-06 NOTE — Therapy (Signed)
?OUTPATIENT PHYSICAL THERAPY NEURO EVALUATION ? ? ?Patient Name: Nicholas Mckenzie ?MRN: [id number] ?DOB:20-May-1946, 76 y.o., male ?Today's Date: 11/06/2021 ? ?PCP: [name], MD  ?REFERRING PROVIDER: [name], MD  ? ? PT End of Session - 11/06/21 1152   ? ? Visit Number 1   ? Number of Visits 17   ? Date for PT Re-Evaluation 02/04/22   ? Authorization Type [employer]   ? PT Start Time [id number]   ? PT Stop Time 1017   ? PT Time Calculation (min) 42 min   ? Equipment Utilized During Treatment Gait belt   ? Activity Tolerance Patient tolerated treatment well   ? Behavior During Therapy [facility] for tasks assessed/performed   ? ?  ?  ? ?  ? ? ?Past Medical History:  ?Diagnosis Date  ? CKD (chronic kidney disease) stage 3, GFR 30-59 ml/min (HCC)   ? Hypertension   ? Kidney stones   ? Sleep apnea   ? ?Past Surgical History:  ?Procedure Laterality Date  ? CATARACT EXTRACTION    ? IR NEPHROSTOMY EXCHANGE RIGHT  06/07/2021  ? IR NEPHROSTOMY EXCHANGE RIGHT  08/22/2021  ? IR NEPHROSTOMY PLACEMENT RIGHT  04/07/2021  ? IR PATIENT EVAL TECH 0-60 MINS  06/02/2021  ? kidney stent    ? MANDIBLE FRACTURE SURGERY    ? ?Patient Active Problem List  ? Diagnosis Date Noted  ? Hypernatremia 08/23/2021  ? Malnutrition of moderate degree 08/17/2021  ? Acute respiratory failure with hypoxia ([place]) 08/16/2021  ? Severe sepsis ([place]) 08/16/2021  ? Sore throat 08/16/2021  ? Hx of subdural hematoma 08/16/2021  ? Nephrolithiasis 08/16/2021  ? Stage 3a chronic kidney disease (CKD) ([place]) 08/16/2021  ? Obesity (BMI 30-39.9) 08/16/2021  ? OSA (obstructive sleep apnea) 08/16/2021  ? Aspiration pneumonia ([place]) 08/15/2021  ? Snoring 08/08/2021  ? Frequent urination 06/07/2021  ? Acute lower UTI 06/07/2021  ? Ascending aortic aneurysm ([place]) 06/07/2021  ? Atrophy of right kidney 06/07/2021  ? Anemia 06/07/2021  ? Obesity (BMI 30.0-34.9) 06/07/2021  ? Subdural hematoma ([place]) 06/06/2021  ? COVID-19 virus infection 06/06/2021  ? Hypokalemia 06/06/2021  ? H/O  ischemic right PCA stroke 06/06/2021  ? History of cardioembolic cerebrovascular accident (CVA) 06/06/2021  ? Right-sided cerebrovascular accident (CVA) ([place]) 04/14/2021  ? Nonfunctioning kidney 04/14/2021  ? Goals of care, counseling/discussion 04/03/2021  ? Ureteral stent occlusion ([place]) 04/02/2021  ? CKD (chronic kidney disease) stage 3a 03/11/2021  ? Major depressive disorder, recurrent episode, in partial remission ([place]) 03/11/2021  ? Hypertension   ? Sleep apnea   ? GAD (generalized anxiety disorder) 07/20/2019  ? ? ?ONSET DATE: 11/02/2021 (date of referral) ? ?REFERRING DIAG: Z86.73 (ICD-10-CM) - Personal history of transient ischemic attack (TIA), and cerebral infarction without residual deficits  ? ?THERAPY DIAG:  ?Unsteadiness on feet ? ?Other abnormalities of gait and mobility ? ?Muscle weakness (generalized) ? ?History of falling ? ?SUBJECTIVE:  ?                                                                                                                                                                                           ? ?  SUBJECTIVE STATEMENT: ?Since October of 2022 when pt had his CVA, pt has been using his RW. Uses his walker sometimes, also has a cane. Per his wife, he does not know how to really use it. Reports having dizziness once in a while. Has had 5-6 falls in the past 6 months (these falls have happened when he wasn't using his RW). Received home health therapy from beginning of march until last week. Weakness is on the L side. On June 16th, they are removing his kidney at [facility]. On both sides, lost vision in L lower quadrant from his CVA. Per pt's wife, he is always leaning on the wall.  ? ?Pt accompanied by: self and significant other wife, [name] ? ?PERTINENT HISTORY: Admitted 08/15/21 with SOB and tachycardia with possibly choking on pill. Pt with acute respiratory failure. PMhx. 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  ? ?brain MRI 04/05/21 with multifocal acute ischemia both cerebral hemispheres (R>L), L middle cerebellar peduncle and inferior L cerebellum ischemia. ? ?PAIN:  ?Are you having pain? Yes: NPRS scale: 2/10 ?Pain location: R side, where the tube is draining his kidney. ?Pain description: Annoying ?Aggravating factors: if the bag falls to the ground, tube gets caught on something ?Relieving factors: Not sure  ? ?Vitals:  ? 11/06/21 0950  ?BP: (!) 147/88  ?Pulse: 64  ? ? ? ?PRECAUTIONS: Fall, pt HOH (has hearing aides but does not wear them), vision impairments.  ? ?FALLS: Has patient fallen in last 6 months? Yes. Number of falls 5-6 ? ?LIVING ENVIRONMENT: ?Lives with: lives with their spouse ?Lives in: House/apartment ?Stairs: No ?Has following equipment at home: Quad cane small base, Walker - 2 wheeled, Shower bench, Grab bars, and Ramped entry, Bedside commode. ? ?PLOF: Independent (prior to CVA) and Leisure: piddling around with the car, liked target shooting  ? ?PATIENT GOALS Wants to walk more, to strengthen legs to do squats.  ? ?OBJECTIVE:  ? ? ?COGNITION: ?Overall cognitive status: Impaired: See speech eval for further details  ?  ?SENSATION: ?Light touch: WFL ? ?COORDINATION: ?Heel to shin; harder to perform with LLE  ? Finger to nose; dysmetric with LLE.  ? ? ? ? ?MMT:   ? ?MMT Right ?11/06/2021 Left ?11/06/2021  ?Hip flexion 3-/5 3+/5  ?Hip extension    ?Hip abduction    ?Hip adduction    ?Hip internal rotation    ?Hip external rotation    ?Knee flexion 4+/5 4/5  ?Knee extension 4+/5 4+/5  ?Ankle dorsiflexion 4+/5 4/5  ?Ankle plantarflexion    ?Ankle inversion    ?Ankle eversion    ?(Blank rows = not tested) ? ?4+/5 hip ADD/ABD in sitting.  ? ?BED MOBILITY:  ?Pt reports he has to grab a drawer at home to help him get in and out of the bed. ? ?TRANSFERS: ?Assistive device utilized:  RW in front of pt   ?Sit to stand: SBA ?Stand to sit: SBA ?Without UE support,  with RLE staggered posteriorly  ? ?Pt grabs RW with both hands to stand instead of pushing up from chair.  ? ? ?GAIT: ?Gait pattern: step through pattern, decreased step length- Right, decreased stance time- Left, decreased stride length, decreased hip/knee flexion- Left, and lateral lean- Left ?Distance walked: Clinic distances during eval.  ?Assistive device utilized: Environmental consultant - 2 wheeled ?Level of assistance: SBA ?Comments: Pt needing cues from therapist to avoid bumping into the  doorframe on either his R or L side during session.  Pt has visual field deficits from CVA.  ? ?Therapist provided tennis balls to pt's RW as well as adjusted it to proper height (was initially too high) ? ?FUNCTIONAL TESTs:  ?5 times sit to stand: 31.31 sec without UE support, 8/10 fatigue afterwards  ?Timed up and go (TUG): 23.03 seconds with RW ?10 meter walk test: 25.75 seconds = 1.27 ft/sec, household ambulator, recurrent fall risk ? ? ? ?TODAY'S TREATMENT:  ?N/At at eval. ? ? ?PATIENT EDUCATION: ?Education details: Clinical findings, POC, using RW at all times in order to decr fall risk.   ?Person educated: Patient and Spouse ?Education method: Explanation ?Education comprehension: verbalized understanding ? ? ?HOME EXERCISE PROGRAM: ?Will provide at next session.  ? ? ? ?GOALS: ?Goals reviewed with patient? Yes ? ?SHORT TERM GOALS: Target date: 12/04/2021    ? ?Pt will be independent with initial HEP in order to build upon functional gains made in therapy. ? ?Baseline: ?Goal status: INITIAL ? ?2.  Pt will undergo assessment of BERG with STG/LTG written. ?Baseline:  ?Goal status: INITIAL ? ?3.  Pt will improve gait speed with RW to at least 1.7  ft/sec in order to demo decr fall risk.  ? ?Baseline: 25.75 seconds = 1.27 ft/sec with RW ?Goal status: INITIAL ? ?4.  Pt will improve TUG time to 19 seconds or less with RW in order to demo decrease fall risk. ? ?Baseline: 23.03 sec with RW ?Goal status: INITIAL ? ?5.  Pt will improve 5x  sit<>stand to less than or equal to 27 sec without UE support with 6/10 or less fatigue to demonstrate improved functional strength and transfer efficiency.  ? ?Baseline: 31.3 seconds 8/10 fatigue.  ?Go

## 2021-11-10 NOTE — Therapy (Signed)
OUTPATIENT OCCUPATIONAL THERAPY NEURO EVALUATION  Patient Name: Nicholas Mckenzie MRN: [id number] DOB:03-18-1946, 76 y.o., male Today's Date: 11/13/2021  PCP: Dr. Doristine Bosworth REFERRING PROVIDER: Dr Doristine Bosworth   OT End of Session - 11/13/21 0839     Visit Number 1    Number of Visits 9    Date for OT Re-Evaluation 12/11/21    Authorization Type [employer]    OT Start Time 0722    OT Stop Time 0800    OT Time Calculation (min) 38 min    Activity Tolerance Patient tolerated treatment well    Behavior During Therapy [facility] for tasks assessed/performed;Impulsive             Past Medical History:  Diagnosis Date   CKD (chronic kidney disease) stage 3, GFR 30-59 ml/min (HCC)    Hypertension    Kidney stones    Sleep apnea    Past Surgical History:  Procedure Laterality Date   CATARACT EXTRACTION     IR NEPHROSTOMY EXCHANGE RIGHT  06/07/2021   IR NEPHROSTOMY EXCHANGE RIGHT  08/22/2021   IR NEPHROSTOMY PLACEMENT RIGHT  04/07/2021   IR PATIENT EVAL TECH 0-60 MINS  06/02/2021   kidney stent     MANDIBLE FRACTURE SURGERY     Patient Active Problem List   Diagnosis Date Noted   Hypernatremia 08/23/2021   Malnutrition of moderate degree 08/17/2021   Acute respiratory failure with hypoxia (HCC) 08/16/2021   Severe sepsis ([place]) 08/16/2021   Sore throat 08/16/2021   Hx of subdural hematoma 08/16/2021   Nephrolithiasis 08/16/2021   Stage 3a chronic kidney disease (CKD) ([place]) 08/16/2021   Obesity (BMI 30-39.9) 08/16/2021   OSA (obstructive sleep apnea) 08/16/2021   Aspiration pneumonia ([place]) 08/15/2021   Snoring 08/08/2021   Frequent urination 06/07/2021   Acute lower UTI 06/07/2021   Ascending aortic aneurysm ([place]) 06/07/2021   Atrophy of right kidney 06/07/2021   Anemia 06/07/2021   Obesity (BMI 30.0-34.9) 06/07/2021   Subdural hematoma ([place]) 06/06/2021   COVID-19 virus infection 06/06/2021   Hypokalemia 06/06/2021   H/O ischemic right PCA stroke 06/06/2021   History of  cardioembolic cerebrovascular accident (CVA) 06/06/2021   Right-sided cerebrovascular accident (CVA) ([place]) 04/14/2021   Nonfunctioning kidney 04/14/2021   Goals of care, counseling/discussion 04/03/2021   Ureteral stent occlusion ([place]) 04/02/2021   CKD (chronic kidney disease) stage 3a 03/11/2021   Major depressive disorder, recurrent episode, in partial remission ([place]) 03/11/2021   Hypertension    Sleep apnea    GAD (generalized anxiety disorder) 07/20/2019    ONSET DATE: 11/02/2021 (date of referral)    REFERRING DIAG: Z86.73 (ICD-10-CM) - Personal history of transient ischemic attack (TIA), and cerebral infarction without residual deficits  THERAPY DIAG:  Attention and concentration deficit - Plan: Ot plan of care cert/re-cert  Muscle weakness (generalized) - Plan: Ot plan of care cert/re-cert  Other lack of coordination - Plan: Ot plan of care cert/re-cert  Other abnormalities of gait and mobility - Plan: Ot plan of care cert/re-cert  Unsteadiness on feet - Plan: Ot plan of care cert/re-cert  Frontal lobe and executive function deficit - Plan: Ot plan of care cert/re-cert  Visuospatial deficit - Plan: Ot plan of care cert/re-cert  Rationale for Evaluation and Treatment Rehabilitation SUBJECTIVE:      SUBJECTIVE STATEMENT:  Pt accompanied by: significant other- Pt has been married approx. 5 years  PERTINENT HISTORY: Admitted 08/15/21 to the hospital with SOB and tachycardia with possibly choking on pill. Pt with acute respiratory failure.  PMhx. 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   brain MRI 04/05/21 with multifocal acute ischemia both cerebral hemispheres (R>L), L middle cerebellar peduncle and inferior L cerebellum ischemia. Pt is having surgery to remove kidney 12/08/21, he will be hospitalized  PRECAUTIONS: Fall and Other: drain, pt is having kidney  removed next month  WEIGHT BEARING RESTRICTIONS No  PAIN:  Are you having pain? Yes: NPRS scale: R arm and shoulder 4//10, left shoulder 2/10 Pain location:see above Pain description: aching Aggravating factors:  use Relieving factors: rest  FALLS: Has patient fallen in last 6 months? No  LIVING ENVIRONMENT: Lives with: lives with their spouse Lives in: House/apartment Stairs:  ramp Has following equipment at home: Walker - 4 wheeled, bed side commode, Grab bars, and built in seat in shower  PLOF: Independent  PATIENT GOALS to drive, to crawl under house to do work,   OBJECTIVE:   HAND DOMINANCE: Left  ADLs: Overall ADLs: supervision for safety due to poor awareness Transfers/ambulation related to ADLs: Eating: mod I Grooming: mod I UB Dressing: supervision, setup, difficulty with buttons LB Dressing: supervision, setup Toileting: supervision Bathing: supervision/ distant supervision Tub Shower transfers: distant supervision Equipment:  built in tub bench   IADLs: Shopping: dependent Light housekeeping: fixing coffee, he has not cooked yet Meal Prep: dependent Community mobility: supervision with rollator Medication management: pt's wife Medical laboratory scientific officer: wife handles Handwriting: 100% legible, increased time, decreased legibility  MOBILITY STATUS: Needs Assist: supervision with rollator       UPPER EXTREMITY ROM   hx of rotator cuff injury both shoulders  Active ROM Right eval Left eval  Shoulder flexion 105 125  Shoulder abduction 75 110  Shoulder adduction    Shoulder extension    Shoulder internal rotation    Shoulder external rotation    Elbow flexion    Elbow extension -20   Wrist flexion    Wrist extension    Wrist ulnar deviation    Wrist radial deviation    Wrist pronation    Wrist supination    (Blank rows = not tested)   UPPER EXTREMITY MMT:     MMT Right eval Left eval  Shoulder flexion 3+/5 4+/5  Shoulder  abduction    Shoulder adduction    Shoulder extension    Shoulder internal rotation    Shoulder external rotation    Middle trapezius    Lower trapezius    Elbow flexion 3+/5 4+/5  Elbow extension 3+/5 4/5  Wrist flexion    Wrist extension    Wrist ulnar deviation    Wrist radial deviation    Wrist pronation    Wrist supination    (Blank rows = not tested)  HAND FUNCTION: Grip strength: Right: 53.5 lbs; Left: 52.2 lbs  COORDINATION: 9 Hole Peg test: Right: 41.72 sec; Left: 75 sec Box and Blocks:  Right NTblocks, Left NTblocks  SENSATION: WFL      COGNITION: Overall cognitive status: Impaired: Attention: Impaired: Sustained, Memory: Impaired: Short term, Awareness: Impaired: Intellectual, and Executive function: Impaired: Impulse control, Problem solving, Organization, Planning, and Self-correction Pt demonstrates overall poor awareness, and self monitoring. He is easily distracted. VISION: Subjective report: Pt reports difficulty reading fine print Baseline vision: Wears glasses for reading only Visual history: cataracts, surgery last year, unable to read fine print  VISION ASSESSMENT: To be further assessed in functional context Visual Fields: Left homonymous hemianopsia per  pt's wife report, he misses food items on plate  Patient has difficulty with following activities due to following visual impairments: reading fine print due to visual deficits  PERCEPTION: Not tested    OBSERVATIONS: Pt was very impulsive on eval and demonstrates decreased self awareness.   TODAY'S TREATMENT:  N/a   PATIENT EDUCATION: Education details: role of OT Person educated: Patient and Spouse Education method: Explanation Education comprehension: verbalized understanding   HOME EXERCISE PROGRAM: N/A    GOALS:   LONG TERM GOALS: Target date: 12/11/21  Pt/caregiver will be I with HEP  Goal status: INITIAL  2.  Pt/ caregiver will be I with visual compensation  strategies  Goal status: INITIAL  3.  Pt will demonstrate improved fine motor coordination as evidenced by decreasing RUE 9 hole peg test score to 70 secs or less. Baseline: 75 secs Goal status: INITIAL  4.  Pt will perform tabletop scanning tasks with a cognitive component with 85% or better accuracy.  Goal status: INITIAL  5.  Pt will navigate a mod distracting environment and locate items with 80% or better accuracy, with no more than min v.c for safety.  Goal status: INITIAL  6.  Pt will retrieve a lightweight object at 110* shoulder flexion with RUE, with pain no greater than 3/10.  Baseline: 105, pain 4/10 Goal status: INITIAL 7.  Pt / caregiver will verbalize understanding of strategies to increase pt safety with ADLs/IADLS and to minimize pain.  Goal status: INITIAL    ASSESSMENT:  CLINICAL IMPRESSION: Patient is a 76 y.o. male who was seen today for occupational therapy evaluation for CVA. Pt. Was hospitalized  08/15/21 to the hospital with SOB and tachycardia with possibly choking on pill. Pt with acute respiratory failure. PMhx. 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  brain MRI 04/05/21 with multifocal acute ischemia both cerebral hemispheres (R>L), L middle cerebellar peduncle and inferior L cerebellum ischemia. Pt is having surgery to remove kidney 12/08/21, he will be hospitalized   PERFORMANCE DEFICITS in functional skills including ADLs, IADLs, coordination, dexterity, edema, ROM, strength, pain, flexibility, FMC, GMC, mobility, balance, endurance, continence, decreased knowledge of use of DME, vision, and UE functional use, cognitive skills including attention, learn, memory, perception, problem solving, safety awareness, sequencing, temperament/personality, thought, and understand, and psychosocial skills including coping strategies, environmental  adaptation, habits, interpersonal interactions, and routines and behaviors.   IMPAIRMENTS are limiting patient from ADLs, IADLs, work, play, leisure, and social participation.   COMORBIDITIES may have co-morbidities  that affects occupational performance. Patient will benefit from skilled OT to address above impairments and improve overall function.  MODIFICATION OR ASSISTANCE TO COMPLETE EVALUATION: Min-Moderate modification of tasks or assist with assess necessary to complete an evaluation.  OT OCCUPATIONAL PROFILE AND HISTORY: Detailed assessment: Review of records and additional review of physical, cognitive, psychosocial history related to current functional performance.  CLINICAL DECISION MAKING: Moderate - several treatment options, min-mod task modification necessary  REHAB POTENTIAL: Good  EVALUATION COMPLEXITY: Moderate    PLAN: OT FREQUENCY: 2x/week  OT DURATION: 4 weeks- POC written for 4 weeks only as pt will be hospitalized for surgery to remove kidney  PLANNED INTERVENTIONS: self care/ADL training, therapeutic exercise, therapeutic activity, neuromuscular re-education, manual therapy, manual lymph drainage, passive range of motion, gait training, balance training, functional mobility training, splinting, electrical stimulation, ultrasound, paraffin, fluidotherapy, moist heat, cryotherapy, contrast bath, patient/family education, cognitive remediation/compensation, visual/perceptual remediation/compensation,  energy conservation, coping strategies training, and DME and/or AE instructions  RECOMMENDED OTHER SERVICES: PT, ST  CONSULTED AND AGREED WITH PLAN OF CARE: Patient and family member/caregiver  PLAN FOR NEXT SESSION: further assess vision in functional context, HEP for coordination and gentle shoulder ROM, hx of RC injuries bilateral   Kearah Gayden, OT 11/13/2021, 12:50 PM  Theone Murdoch, OTR/L [phone] Phone: [phone] 12:50 PM 11/13/21

## 2021-11-13 ENCOUNTER — Ambulatory Visit: Payer: Medicare PPO | Admitting: Physical Therapy

## 2021-11-13 ENCOUNTER — Ambulatory Visit: Payer: Medicare PPO | Admitting: Occupational Therapy

## 2021-11-13 DIAGNOSIS — R2681 Unsteadiness on feet: Secondary | ICD-10-CM | POA: Diagnosis not present

## 2021-11-13 DIAGNOSIS — R41844 Frontal lobe and executive function deficit: Secondary | ICD-10-CM | POA: Diagnosis not present

## 2021-11-13 DIAGNOSIS — R41841 Cognitive communication deficit: Secondary | ICD-10-CM | POA: Diagnosis not present

## 2021-11-13 DIAGNOSIS — M6281 Muscle weakness (generalized): Secondary | ICD-10-CM | POA: Diagnosis not present

## 2021-11-13 DIAGNOSIS — Z9181 History of falling: Secondary | ICD-10-CM

## 2021-11-13 DIAGNOSIS — R41842 Visuospatial deficit: Secondary | ICD-10-CM

## 2021-11-13 DIAGNOSIS — R2689 Other abnormalities of gait and mobility: Secondary | ICD-10-CM

## 2021-11-13 DIAGNOSIS — R278 Other lack of coordination: Secondary | ICD-10-CM

## 2021-11-13 DIAGNOSIS — R41840 Attention and concentration deficit: Secondary | ICD-10-CM

## 2021-11-13 NOTE — Therapy (Signed)
OUTPATIENT PHYSICAL THERAPY TREATMENT NOTE   Patient Name: Nicholas Mckenzie MRN: [id number] DOB:24-Apr-1946, 76 y.o., male Today's Date: 11/13/2021  PCP: [name], MD   REFERRING PROVIDER: [name], MD    END OF SESSION:   PT End of Session - 11/13/21 0804     Visit Number 2    Number of Visits 17    Date for PT Re-Evaluation 02/04/22    Authorization Type [employer]    PT Start Time 0803    PT Stop Time 0848    PT Time Calculation (min) 45 min    Equipment Utilized During Treatment Gait belt    Activity Tolerance Patient tolerated treatment well    Behavior During Therapy WFL for tasks assessed/performed             Past Medical History:  Diagnosis Date   CKD (chronic kidney disease) stage 3, GFR 30-59 ml/min ([place])    Hypertension    Kidney stones    Sleep apnea    Past Surgical History:  Procedure Laterality Date   CATARACT EXTRACTION     IR NEPHROSTOMY EXCHANGE RIGHT  06/07/2021   IR NEPHROSTOMY EXCHANGE RIGHT  08/22/2021   IR NEPHROSTOMY PLACEMENT RIGHT  04/07/2021   IR PATIENT EVAL TECH 0-60 MINS  06/02/2021   kidney stent     MANDIBLE FRACTURE SURGERY     Patient Active Problem List   Diagnosis Date Noted   Hypernatremia 08/23/2021   Malnutrition of moderate degree 08/17/2021   Acute respiratory failure with hypoxia ([place]) 08/16/2021   Severe sepsis ([place]) 08/16/2021   Sore throat 08/16/2021   Hx of subdural hematoma 08/16/2021   Nephrolithiasis 08/16/2021   Stage 3a chronic kidney disease (CKD) ([place]) 08/16/2021   Obesity (BMI 30-39.9) 08/16/2021   OSA (obstructive sleep apnea) 08/16/2021   Aspiration pneumonia ([place]) 08/15/2021   Snoring 08/08/2021   Frequent urination 06/07/2021   Acute lower UTI 06/07/2021   Ascending aortic aneurysm ([place]) 06/07/2021   Atrophy of right kidney 06/07/2021   Anemia 06/07/2021   Obesity (BMI 30.0-34.9) 06/07/2021   Subdural hematoma ([place]) 06/06/2021   COVID-19 virus infection 06/06/2021   Hypokalemia  06/06/2021   H/O ischemic right PCA stroke 06/06/2021   History of cardioembolic cerebrovascular accident (CVA) 06/06/2021   Right-sided cerebrovascular accident (CVA) ([place]) 04/14/2021   Nonfunctioning kidney 04/14/2021   Goals of care, counseling/discussion 04/03/2021   Ureteral stent occlusion ([place]) 04/02/2021   CKD (chronic kidney disease) stage 3a 03/11/2021   Major depressive disorder, recurrent episode, in partial remission ([place]) 03/11/2021   Hypertension    Sleep apnea    GAD (generalized anxiety disorder) 07/20/2019    REFERRING DIAG:  [id number] (ICD-10-CM) - Personal history of transient ischemic attack (TIA), and cerebral infarction without residual deficits     THERAPY DIAG:  Unsteadiness on feet  Other abnormalities of gait and mobility  Muscle weakness (generalized)  History of falling  Rationale for Evaluation and Treatment Rehabilitation  PERTINENT HISTORY: Admitted 08/15/21 with SOB and tachycardia with possibly choking on pill. Pt with acute respiratory failure. PMhx. 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    PRECAUTIONS: Fall  SUBJECTIVE: Pt reports falling out of bed a few days ago. Wife reports he positions himself poorly in bed after getting up to use restroom at night. Wife has changed height of bed from 69' to 6" but pt still rolling out of bed  into floor. Obtained Nitro Duet rollator over weekend and is walking much better w/it compared to RW.   PAIN:  Are you having pain? Yes: NPRS scale: 5/10 Pain location: R shoulder Pain description: Achy   OBJECTIVE:   COGNITION: Overall cognitive status: Impaired: See speech eval for further details     TODAY'S TREATMENT:   [facility] PT Assessment - 11/13/21 0811       Balance   Balance Assessed Yes      Standardized Balance Assessment   Standardized Balance Assessment Berg Balance Test       Berg Balance Test   Sit to Stand Able to stand without using hands and stabilize independently    Standing Unsupported Able to stand 2 minutes with supervision    Sitting with Back Unsupported but Feet Supported on Floor or Stool Able to sit safely and securely 2 minutes    Stand to Sit Controls descent by using hands    Transfers Able to transfer with verbal cueing and /or supervision    Standing Unsupported with Eyes Closed Able to stand 10 seconds safely    Standing Unsupported with Feet Together Needs help to attain position but able to stand for 30 seconds with feet together    From Standing, Reach Forward with Outstretched Arm Reaches forward but needs supervision   ~5"   From Standing Position, Pick up Object from Floor Unable to try/needs assist to keep balance   Pt bracing against mat   From Standing Position, Turn to Look Behind Over each Shoulder Needs supervision when turning    Turn 360 Degrees Needs close supervision or verbal cueing    Standing Unsupported, Alternately Place Feet on Step/Stool Able to complete >2 steps/needs minimal assist    Standing Unsupported, One Foot in Front Able to take small step independently and hold 30 seconds    Standing on One Leg Tries to lift leg/unable to hold 3 seconds but remains standing independently    Total Score 28    Berg comment: High fall risk            Ther Ex  Established and demonstrated initial HEP (see below). Heavy education on starting walking program (5 minutes/day 2x/day) for improved stamina and global strength/balance. Encouraging pt to use rollator for walking program to improve comfort and maneuverability of it. Pt and wife verbalized understanding. Added proper sit <>stand technique without use of UE support to HEP, as pt heavily reliant on bracing against mat/chair to stand. Demonstrated proper technique using NDT principles for anterior weight shift.     PATIENT EDUCATION: Education details: Starting walking  program (5 min per day, 2x day), Merrilee Jansky outcome assessment, initial HEP  Person educated: Patient and Spouse Education method: Explanation Education comprehension: verbalized understanding     HOME EXERCISE PROGRAM: Access Code: [email or web address] URL: [email or web address] Date: 11/13/2021 Prepared by: Mickie Bail Alba Perillo  Exercises - Sit to Stand Without Arm Support  - 1 x daily - 7 x weekly - 3 sets - 10 reps        GOALS: Goals reviewed with patient? Yes   SHORT TERM GOALS: Target date: 12/04/2021      Pt will be independent with initial HEP in order to build upon functional gains made in therapy.   Baseline: Goal status: INITIAL   2.  Pt will improve Berg score to 33/56 for decreased fall risk  Baseline: 28/56 on 5/22 Goal status: INITIAL   3.  Pt will improve gait speed with RW to  at least 1.7  ft/sec in order to demo decr fall risk.    Baseline: 25.75 seconds = 1.27 ft/sec with RW Goal status: INITIAL   4.  Pt will improve TUG time to 19 seconds or less with RW in order to demo decrease fall risk.   Baseline: 23.03 sec with RW Goal status: INITIAL   5.  Pt will improve 5x sit<>stand to less than or equal to 27 sec without UE support with 6/10 or less fatigue to demonstrate improved functional strength and transfer efficiency.    Baseline: 31.3 seconds 8/10 fatigue.  Goal status: INITIAL       LONG TERM GOALS: Target date: 01/01/2022     Pt will be independent with final HEP in order to build upon functional gains made in therapy. Baseline:  Goal status: INITIAL   2.  Pt will improve Berg score to 38/56 for decreased fall risk  Baseline: 28/56 on 5/22 Goal status: INITIAL   3.  Pt will improve gait speed with RW to at least 2.2  ft/sec in order to demo decr fall risk/improved community mobility.  Baseline: 25.75 seconds = 1.27 ft/sec with RW Goal status: INITIAL   4.  Pt will improve 5x sit<>stand to less than or equal to 22 sec without UE support with  3/10 or less fatigue to demonstrate improved functional strength and transfer efficiency.  Baseline: 31.31 sec without UE support, 8/10 fatigue afterwards  Goal status: INITIAL   5.  Pt will ambulate at least 230' with LRAD (potentially cane) around indoor level surfaces with supervision in order to demo improved household mobility.  Baseline: currently using RW with supervision.  Goal status: INITIAL   ASSESSMENT:   CLINICAL IMPRESSION: Emphasis of skilled PT session on balance assessment and establishing initial HEP. Pt achieved a 28/56 on Berg, indicative of high fall risk. Pt demonstrated the most difficulty with turning, single leg stability and narrow BOS. Provided education on importance of initiating walking program for improved stamina and increased comfort using new rollator, as pt typically does not use it inside home. Pt and wife verbalized understanding. Continue POC.        OBJECTIVE IMPAIRMENTS Abnormal gait, decreased activity tolerance, decreased balance, decreased cognition, decreased coordination, decreased endurance, decreased knowledge of use of DME, difficulty walking, decreased strength, decreased safety awareness, dizziness, impaired vision/preception, and pain.    ACTIVITY LIMITATIONS cleaning, community activity, and driving.    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   are also affecting patient's functional outcome.      REHAB POTENTIAL: Good   CLINICAL DECISION MAKING: Evolving/moderate complexity   EVALUATION COMPLEXITY: Moderate   PLAN: PT FREQUENCY: 2x/week   PT DURATION: 12 weeks   PLANNED INTERVENTIONS: Therapeutic exercises, Therapeutic activity, Neuromuscular re-education, Balance training, Gait  training, Patient/Family education, Stair training, Vestibular training, Visual/preceptual remediation/compensation, and DME instructions   PLAN FOR NEXT SESSION: Monitor BP, add to HEP, scifit for endurance, toe taps to step, narrow BOS, single leg stance    Walker Sitar E Rhone Ozaki, PT,DPT  11/13/2021, 10:30 AM

## 2021-11-16 ENCOUNTER — Ambulatory Visit: Payer: Medicare PPO | Admitting: Speech Pathology

## 2021-11-16 ENCOUNTER — Encounter: Payer: Self-pay | Admitting: Physical Therapy

## 2021-11-16 ENCOUNTER — Encounter: Payer: Self-pay | Admitting: Occupational Therapy

## 2021-11-16 ENCOUNTER — Ambulatory Visit: Payer: Medicare PPO | Admitting: Physical Therapy

## 2021-11-16 ENCOUNTER — Ambulatory Visit: Payer: Medicare PPO | Admitting: Occupational Therapy

## 2021-11-16 VITALS — BP 160/80 | HR 68

## 2021-11-16 VITALS — BP 178/88 | HR 66

## 2021-11-16 DIAGNOSIS — R41841 Cognitive communication deficit: Secondary | ICD-10-CM

## 2021-11-16 DIAGNOSIS — R2689 Other abnormalities of gait and mobility: Secondary | ICD-10-CM | POA: Diagnosis not present

## 2021-11-16 DIAGNOSIS — M6281 Muscle weakness (generalized): Secondary | ICD-10-CM

## 2021-11-16 DIAGNOSIS — R278 Other lack of coordination: Secondary | ICD-10-CM

## 2021-11-16 DIAGNOSIS — R41844 Frontal lobe and executive function deficit: Secondary | ICD-10-CM

## 2021-11-16 DIAGNOSIS — R41842 Visuospatial deficit: Secondary | ICD-10-CM | POA: Diagnosis not present

## 2021-11-16 DIAGNOSIS — R2681 Unsteadiness on feet: Secondary | ICD-10-CM

## 2021-11-16 DIAGNOSIS — Z9181 History of falling: Secondary | ICD-10-CM | POA: Diagnosis not present

## 2021-11-16 DIAGNOSIS — R41840 Attention and concentration deficit: Secondary | ICD-10-CM

## 2021-11-16 DIAGNOSIS — R471 Dysarthria and anarthria: Secondary | ICD-10-CM

## 2021-11-16 NOTE — Therapy (Signed)
OUTPATIENT SPEECH LANGUAGE PATHOLOGY SPEECH AND COGNITIVE EVALUATION   Patient Name: Nicholas Mckenzie MRN: [id number] DOB:March 29, 1946, 76 y.o., male Today's Date: 11/16/2021  PCP: [name], MD  REFERRING PROVIDER: [name], MD    End of Session - 11/16/21 1009     Visit Number 2    Number of Visits 25    Date for SLP Re-Evaluation 01/29/22    SLP Start Time 67    SLP Stop Time  1104    SLP Time Calculation (min) 49 min    Activity Tolerance Patient tolerated treatment well             Past Medical History:  Diagnosis Date   CKD (chronic kidney disease) stage 3, GFR 30-59 ml/min ([place])    Hypertension    Kidney stones    Sleep apnea    Past Surgical History:  Procedure Laterality Date   CATARACT EXTRACTION     IR NEPHROSTOMY EXCHANGE RIGHT  06/07/2021   IR NEPHROSTOMY EXCHANGE RIGHT  08/22/2021   IR NEPHROSTOMY PLACEMENT RIGHT  04/07/2021   IR PATIENT EVAL TECH 0-60 MINS  06/02/2021   kidney stent     MANDIBLE FRACTURE SURGERY     Patient Active Problem List   Diagnosis Date Noted   Hypernatremia 08/23/2021   Malnutrition of moderate degree 08/17/2021   Acute respiratory failure with hypoxia ([place]) 08/16/2021   Severe sepsis ([place]) 08/16/2021   Sore throat 08/16/2021   Hx of subdural hematoma 08/16/2021   Nephrolithiasis 08/16/2021   Stage 3a chronic kidney disease (CKD) ([place]) 08/16/2021   Obesity (BMI 30-39.9) 08/16/2021   OSA (obstructive sleep apnea) 08/16/2021   Aspiration pneumonia ([place]) 08/15/2021   Snoring 08/08/2021   Frequent urination 06/07/2021   Acute lower UTI 06/07/2021   Ascending aortic aneurysm ([place]) 06/07/2021   Atrophy of right kidney 06/07/2021   Anemia 06/07/2021   Obesity (BMI 30.0-34.9) 06/07/2021   Subdural hematoma ([place]) 06/06/2021   COVID-19 virus infection 06/06/2021   Hypokalemia 06/06/2021   H/O ischemic right PCA stroke 06/06/2021   History of cardioembolic cerebrovascular accident (CVA) 06/06/2021   Right-sided  cerebrovascular accident (CVA) ([place]) 04/14/2021   Nonfunctioning kidney 04/14/2021   Goals of care, counseling/discussion 04/03/2021   Ureteral stent occlusion ([place]) 04/02/2021   CKD (chronic kidney disease) stage 3a 03/11/2021   Major depressive disorder, recurrent episode, in partial remission ([place]) 03/11/2021   Hypertension    Sleep apnea    GAD (generalized anxiety disorder) 07/20/2019    ONSET DATE: 08/16/21   REFERRING DIAG: [id number] (ICD-10-CM) - Personal history of transient ischemic attack (TIA), and cerebral infarction without residual deficits   THERAPY DIAG:  Cognitive communication Dysarthria and Anarthria  SUBJECTIVE:   SUBJECTIVE STATEMENT: "Oh, wonderful" Pt accompanied by: significant other Spouse [name]  PAIN:  Are you having pain? No  OBJECTIVE:   TODAY'S TREATMENT:  11-16-21: Target improved safety and awareness through instruction of Stop-Think-Do meta-cognitive strategy instruction. Following education, pt generated x3 potential chores he could do to contribute to household and reduce burden on [name]. Mod-I with sequencing steps of target activity, max-A for ID potential safety concerns or barriers to completion. Max-A for solutions or compensations to mitigate mentioned problems. SLP suggests that pt attempt to complete targeted activities with supervision from Kenneth with cueing as needed to implement compensations or strategies (e.g. having tools available, ensuring having something to hold on to, taking small loads of clothes to laundry room, sitting while folding load of laundry). [name] tells ST  that despite sitting out meds for patient in AM, he doesn't always take and she will find some on floor. Plan to generate alternative method of pill administration to increase pt's independence in future session.    PATIENT EDUCATION: Education details: areas of impairment, cognitive activities to do at home Person educated: Patient and Spouse Education method:  Explanation, Verbal cues, and Handouts Education comprehension: verbal cues required and needs further education   ASSESSMENT:  CLINICAL IMPRESSION: Patient is a 76 y.o. MALE who was seen today for speech and cognition. Marcellino is accompanied by his spouse, [name]. They ongoingly were concerned about carrying over speech strategies which HHST implemented. Today, Mitsugi is 100% intelligible with good volume, clear phonatoin and no slur. Further questioning revealed [name] is concerned about Keidan's cognition. She will not leave him home alone for even brief periods due to fear of falling and poor safety awareness. At this time, [name] is managing all of Seann's appointments, medications, and providing A with ADL's.  [name] would like to return to work part time but has not been able to do this since Issac's CVA. At this time, Savva is not recalling pertinent information and is not helping out with household chores. [name] relayed that Lysander insisted on jumping the truck's battery, which she asked him not to do. This resulted in a fall in the driveway. Erland requires cues for intellectual awareness of cognitive impairments, stating that he is "doing fine, no troubles". On the CLQT, Laren was distractable having to be redirected to symbol cancellation, clock drawing and generative naming tasks. I recommend skilled ST to maximize cognition and train in compensatory strategies for cognition for safety and to reduce caregiver burden.   OBJECTIVE IMPAIRMENTS include attention, memory, awareness, and executive functioning. These impairments are limiting patient from managing medications, managing appointments, managing finances, household responsibilities, and ADLs/IADLs. Factors affecting potential to achieve goals and functional outcome are ability to learn/carryover information. Patient will benefit from skilled SLP services to address above impairments and improve overall function.  REHAB POTENTIAL: Good   GOALS: Goals  reviewed with patient? Yes  SHORT TERM GOALS: Target date: 12/04/2021    Pt will use external aids to recall am meds with occasional min A from [place] over 1 week Baseline: Goal status: IN PROGRESS  2.  Pt will use external aids to recall appointments with occasional min A from spouse over 1 week Baseline:  Goal status: IN PROGRESS  3.  Pt will use to do list or other memory aid to complete 2 household chores 5/7 days a week with occasional min A from spouse Baseline:  Goal status: IN PROGRESS  4.  Pt will verbalize intellectual awareness of 3 cognitive impairments with rare min A Baseline:  Goal status: IN PROGRESS  5.  Pt will verbalize 3 safety concerns re: mobility and cognition in the home with rare min A Baseline:  Goal status: IN PROGRESS  6.  Complete CLQT Baseline:  Goal status: IN PROGRESS  LONG TERM GOALS: Target date: 01/29/2022    Pt will use external aids to recall meds and appointments with rare min A over 1 week Baseline:  Goal status: IN PROGRESS  2.  Pt will use memory book or other aid to recall pertinent information, messages and conversations 3x over 1 week with occasional min A from spouse Baseline:  Goal status: IN PROGRESS  3.  Pt will complete 3 house hold chores 5/7 days with to do list or other external aid Baseline:  Goal status: IN PROGRESS  4. Pt will carryover compensations for dysarthria to be 100% intelligible over 20 minute conversation with mod I   PLAN: SLP FREQUENCY: 2x/week  SLP DURATION: 12 weeks  PLANNED INTERVENTIONS: Language facilitation, Environmental controls, Cueing hierachy, Cognitive reorganization, Internal/external aids, Functional tasks, Multimodal communication approach, SLP instruction and feedback, Compensatory strategies, and Patient/family education    Su Monks, CCC-SLP 11/16/2021, 11:17 AM

## 2021-11-16 NOTE — Therapy (Signed)
OUTPATIENT OCCUPATIONAL THERAPY TREATMENT NOTE   Patient Name: Rodgers Stolte MRN: [id number] DOB:10-10-1945, 76 y.o., male Today's Date: 11/16/2021  PCP: Dr. Doristine Bosworth REFERRING PROVIDER: Dr Doristine Bosworth  END OF SESSION:   OT End of Session - 11/16/21 1032     Visit Number 2    Number of Visits 9    Date for OT Re-Evaluation 12/11/21    Authorization Type [employer]    OT Start Time 0930    OT Stop Time 1015    OT Time Calculation (min) 45 min    Activity Tolerance Patient tolerated treatment well    Behavior During Therapy [facility] for tasks assessed/performed;Impulsive             Past Medical History:  Diagnosis Date   CKD (chronic kidney disease) stage 3, GFR 30-59 ml/min ([facility])    Hypertension    Kidney stones    Sleep apnea    Past Surgical History:  Procedure Laterality Date   CATARACT EXTRACTION     IR NEPHROSTOMY EXCHANGE RIGHT  06/07/2021   IR NEPHROSTOMY EXCHANGE RIGHT  08/22/2021   IR NEPHROSTOMY PLACEMENT RIGHT  04/07/2021   IR PATIENT EVAL TECH 0-60 MINS  06/02/2021   kidney stent     MANDIBLE FRACTURE SURGERY     Patient Active Problem List   Diagnosis Date Noted   Hypernatremia 08/23/2021   Malnutrition of moderate degree 08/17/2021   Acute respiratory failure with hypoxia ([facility]) 08/16/2021   Severe sepsis ([place]) 08/16/2021   Sore throat 08/16/2021   Hx of subdural hematoma 08/16/2021   Nephrolithiasis 08/16/2021   Stage 3a chronic kidney disease (CKD) ([place]) 08/16/2021   Obesity (BMI 30-39.9) 08/16/2021   OSA (obstructive sleep apnea) 08/16/2021   Aspiration pneumonia ([place]) 08/15/2021   Snoring 08/08/2021   Frequent urination 06/07/2021   Acute lower UTI 06/07/2021   Ascending aortic aneurysm ([place]) 06/07/2021   Atrophy of right kidney 06/07/2021   Anemia 06/07/2021   Obesity (BMI 30.0-34.9) 06/07/2021   Subdural hematoma ([place]) 06/06/2021   COVID-19 virus infection 06/06/2021   Hypokalemia 06/06/2021   H/O ischemic right PCA stroke 06/06/2021    History of cardioembolic cerebrovascular accident (CVA) 06/06/2021   Right-sided cerebrovascular accident (CVA) ([place]) 04/14/2021   Nonfunctioning kidney 04/14/2021   Goals of care, counseling/discussion 04/03/2021   Ureteral stent occlusion ([place]) 04/02/2021   CKD (chronic kidney disease) stage 3a 03/11/2021   Major depressive disorder, recurrent episode, in partial remission ([place]) 03/11/2021   Hypertension    Sleep apnea    GAD (generalized anxiety disorder) 07/20/2019    ONSET DATE: 11/02/2021 (date of referral)      REFERRING DIAG: Z86.73 (ICD-10-CM) - Personal history of transient ischemic attack (TIA), and cerebral infarction without residual deficits  THERAPY DIAG:  Muscle weakness (generalized)  Other lack of coordination  Other abnormalities of gait and mobility  Frontal lobe and executive function deficit  Unsteadiness on feet  Attention and concentration deficit  Visuospatial deficit  Rationale for Evaluation and Treatment Rehabilitation  PERTINENT HISTORY: Admitted 08/15/21 to the hospital with SOB and tachycardia with possibly choking on pill. Pt with acute respiratory failure. PMhx. 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   brain MRI 04/05/21 with multifocal acute ischemia both cerebral hemispheres (R>L), L middle cerebellar peduncle and inferior L cerebellum ischemia. Pt is having surgery to remove kidney 12/08/21, he will be hospitalized   PRECAUTIONS: Fall and Other: drain,  pt is having kidney removed next month  SUBJECTIVE: Pt in OT following PT today. Wife arrived with BP medication for elevated BP.    Today's Vitals   11/16/21 0939 11/16/21 0941  BP: (!) 205/108 (!) 178/88  Pulse: 66    There is no height or weight on file to calculate BMI.   PAIN:  Are you having pain? No Chronic RTC tear in RUE pain     OBJECTIVE:   TODAY'S  TREATMENT: 11/16/21 Pt finished with PT at edge of mat and ambulated with rollator to table for OT session. Pt req'd mod verbal and tactile cues for directions to table and navigating obstacles.  Reviewed goals with patient - pt verbalized understanding Analog and digital clocks pt worked on visual tabletop scanning and attention. Pt with increased time for cards x 15 but with good scanning and attention to all areas. Pt reports some difficulty with seeing in left lower quadrant but reports compensations with head turns. Sorting playing cards did well with sorting cards with identifying and placing cards in piles of face value - pt had some repeat piles but was able to scan appropriately and sort in sequential order.     GOALS:     LONG TERM GOALS: Target date: 12/11/21   Pt/caregiver will be I with HEP   Goal status: INITIAL   2.  Pt/ caregiver will be I with visual compensation strategies   Goal status: INITIAL   3.  Pt will demonstrate improved fine motor coordination as evidenced by decreasing RUE 9 hole peg test score to 70 secs or less. Baseline: 75 secs Goal status: INITIAL   4.  Pt will perform tabletop scanning tasks with a cognitive component with 85% or better accuracy.   Goal status: INITIAL   5.  Pt will navigate a mod distracting environment and locate items with 80% or better accuracy, with no more than min v.c for safety.   Goal status: INITIAL   6.  Pt will retrieve a lightweight object at 110* shoulder flexion with RUE, with pain no greater than 3/10.  Baseline: 105, pain 4/10 Goal status: INITIAL  7.  Pt / caregiver will verbalize understanding of strategies to increase pt safety with ADLs/IADLS and to minimize pain.   Goal status: INITIAL       ASSESSMENT:   CLINICAL IMPRESSION: Patient has verbalized understanding of goals. Pt with good scanning pattern today for tabletop scanning. Continue to progress towards goals.     PERFORMANCE DEFICITS in  functional skills including ADLs, IADLs, coordination, dexterity, edema, ROM, strength, pain, flexibility, FMC, GMC, mobility, balance, endurance, continence, decreased knowledge of use of DME, vision, and UE functional use, cognitive skills including attention, learn, memory, perception, problem solving, safety awareness, sequencing, temperament/personality, thought, and understand, and psychosocial skills including coping strategies, environmental adaptation, habits, interpersonal interactions, and routines and behaviors.    IMPAIRMENTS are limiting patient from ADLs, IADLs, work, play, leisure, and social participation.    COMORBIDITIES may have co-morbidities  that affects occupational performance. Patient will benefit from skilled OT to address above impairments and improve overall function.   MODIFICATION OR ASSISTANCE TO COMPLETE EVALUATION: Min-Moderate modification of tasks or assist with assess necessary to complete an evaluation.   OT OCCUPATIONAL PROFILE AND HISTORY: Detailed assessment: Review of records and additional review of physical, cognitive, psychosocial history related to current functional performance.   CLINICAL DECISION MAKING: Moderate - several treatment options, min-mod task modification necessary   REHAB POTENTIAL: Good  EVALUATION COMPLEXITY: Moderate     PLAN: OT FREQUENCY: 2x/week   OT DURATION: 4 weeks- POC written for 4 weeks only as pt will be hospitalized for surgery to remove kidney   PLANNED INTERVENTIONS: self care/ADL training, therapeutic exercise, therapeutic activity, neuromuscular re-education, manual therapy, manual lymph drainage, passive range of motion, gait training, balance training, functional mobility training, splinting, electrical stimulation, ultrasound, paraffin, fluidotherapy, moist heat, cryotherapy, contrast bath, patient/family education, cognitive remediation/compensation, visual/perceptual remediation/compensation, energy  conservation, coping strategies training, and DME and/or AE instructions   RECOMMENDED OTHER SERVICES: PT, ST   CONSULTED AND AGREED WITH PLAN OF CARE: Patient and family member/caregiver   PLAN FOR NEXT SESSION: further assess vision in functional context, HEP for coordination and gentle shoulder ROM, hx of RC injuries bilateral, tabletop scanning, safety ambulating in environment   Zachery Conch, OT 11/16/2021, 10:32 AM

## 2021-11-16 NOTE — Therapy (Signed)
OUTPATIENT PHYSICAL THERAPY TREATMENT NOTE   Patient Name: Nicholas Mckenzie MRN: [id number] DOB:1945/11/29, 76 y.o., male Today's Date: 11/16/2021  PCP: [name], MD   REFERRING PROVIDER: [name], MD    END OF SESSION:   PT End of Session - 11/16/21 0851     Visit Number 3    Number of Visits 17    Date for PT Re-Evaluation 02/04/22    Authorization Type [employer]    PT Start Time 0848    PT Stop Time 0928    PT Time Calculation (min) 40 min    Equipment Utilized During Treatment Gait belt    Activity Tolerance Patient tolerated treatment well    Behavior During Therapy WFL for tasks assessed/performed             Past Medical History:  Diagnosis Date   CKD (chronic kidney disease) stage 3, GFR 30-59 ml/min ([place])    Hypertension    Kidney stones    Sleep apnea    Past Surgical History:  Procedure Laterality Date   CATARACT EXTRACTION     IR NEPHROSTOMY EXCHANGE RIGHT  06/07/2021   IR NEPHROSTOMY EXCHANGE RIGHT  08/22/2021   IR NEPHROSTOMY PLACEMENT RIGHT  04/07/2021   IR PATIENT EVAL TECH 0-60 MINS  06/02/2021   kidney stent     MANDIBLE FRACTURE SURGERY     Patient Active Problem List   Diagnosis Date Noted   Hypernatremia 08/23/2021   Malnutrition of moderate degree 08/17/2021   Acute respiratory failure with hypoxia ([place]) 08/16/2021   Severe sepsis ([place]) 08/16/2021   Sore throat 08/16/2021   Hx of subdural hematoma 08/16/2021   Nephrolithiasis 08/16/2021   Stage 3a chronic kidney disease (CKD) ([place]) 08/16/2021   Obesity (BMI 30-39.9) 08/16/2021   OSA (obstructive sleep apnea) 08/16/2021   Aspiration pneumonia ([place]) 08/15/2021   Snoring 08/08/2021   Frequent urination 06/07/2021   Acute lower UTI 06/07/2021   Ascending aortic aneurysm ([place]) 06/07/2021   Atrophy of right kidney 06/07/2021   Anemia 06/07/2021   Obesity (BMI 30.0-34.9) 06/07/2021   Subdural hematoma ([place]) 06/06/2021   COVID-19 virus infection 06/06/2021   Hypokalemia  06/06/2021   H/O ischemic right PCA stroke 06/06/2021   History of cardioembolic cerebrovascular accident (CVA) 06/06/2021   Right-sided cerebrovascular accident (CVA) ([place]) 04/14/2021   Nonfunctioning kidney 04/14/2021   Goals of care, counseling/discussion 04/03/2021   Ureteral stent occlusion ([place]) 04/02/2021   CKD (chronic kidney disease) stage 3a 03/11/2021   Major depressive disorder, recurrent episode, in partial remission ([place]) 03/11/2021   Hypertension    Sleep apnea    GAD (generalized anxiety disorder) 07/20/2019    REFERRING DIAG:  [id number] (ICD-10-CM) - Personal history of transient ischemic attack (TIA), and cerebral infarction without residual deficits     THERAPY DIAG:  Muscle weakness (generalized)  Other abnormalities of gait and mobility  Unsteadiness on feet  Rationale for Evaluation and Treatment Rehabilitation  PERTINENT HISTORY: Admitted 08/15/21 with SOB and tachycardia with possibly choking on pill. Pt with acute respiratory failure. PMhx. 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    PRECAUTIONS: Fall  SUBJECTIVE: No new complaints. No new falls.   PAIN:  Are you having pain? Yes NPRS scale: 2/10 Pain location: right shoulder Pain orientation: Other: right   PAIN TYPE: chronic Pain description: constant and aching  Aggravating factors: past falls that have caused issues; overuse or  use of power tools aggravates it Relieving factors: has had therapy in the past, medicated rub   OBJECTIVE:   COGNITION: Overall cognitive status: Impaired: See speech eval for further details  Vitals:   11/16/21 0855 11/16/21 0903  BP: (!) 187/110 with automated machine (!) 160/80 manual recheck after rest  Pulse: 68       TODAY'S TREATMENT: 11/16/2021 SELF CARE:  Pt's initial BP reading high. Pt then reports he forgot to take his med's this morning.  Spouse left to go get med's for pt to take as he has OT and ST after PT session today. Upon manual recheck after sitting quietly for a few minutes BP lower and WFL's to continue with PT.  BALANCE/NMR: Added ex's to HEP today to further address weight shifting and balance. Refer to [place] for full details. Min guard assist for safety with cues to task and ex form needed.   164/94 after balance ex's for HEP (manually)  170/94 after seated rest break of ~6 minutes (manually)      PATIENT EDUCATION: Education details: importance of taking med's on time; additions to HEP for balance Person educated: Patient and Spouse Education method: Explanation Education comprehension: verbalized understanding     HOME EXERCISE PROGRAM: Access Code: [email or web address] URL: [email or web address] Date: 11/16/2021 Prepared by: Willow Ora  Exercises - Sit to Stand Without Arm Support  - 1 x daily - 7 x weekly - 3 sets - 10 reps  Added to HEP today - Heel Toe Raises with Counter Support  - 1 x daily - 5 x weekly - 1 sets - 10 reps - Standing Hip Abduction with Counter Support  - 1 x daily - 5 x weekly - 1 sets - 10 reps - Standing Single Leg Stance with Counter Support  - 1 x daily - 5 x weekly - 1 sets - 3 reps - 15 seconds hold - Standing in corner with eyes closed  - 1 x daily - 5 x weekly - 1 sets - 3 reps - 30 seconds hold     GOALS: Goals reviewed with patient? Yes   SHORT TERM GOALS: Target date: 12/04/2021      Pt will be independent with initial HEP in order to build upon functional gains made in therapy.   Baseline: Goal status: INITIAL   2.  Pt will improve Berg score to 33/56 for decreased fall risk  Baseline: 28/56 on 5/22 Goal status: INITIAL   3.  Pt will improve gait speed with RW to at least 1.7  ft/sec in order to demo decr fall risk.    Baseline: 25.75 seconds = 1.27 ft/sec with RW Goal status: INITIAL   4.  Pt will improve TUG time to 19 seconds or less with RW  in order to demo decrease fall risk.   Baseline: 23.03 sec with RW Goal status: INITIAL   5.  Pt will improve 5x sit<>stand to less than or equal to 27 sec without UE support with 6/10 or less fatigue to demonstrate improved functional strength and transfer efficiency.    Baseline: 31.3 seconds 8/10 fatigue.  Goal status: INITIAL       LONG TERM GOALS: Target date: 01/01/2022     Pt will be independent with final HEP in order to build upon functional gains made in therapy. Baseline:  Goal status: INITIAL   2.  Pt will improve Berg score to 38/56 for decreased fall risk  Baseline: 28/56 on 5/22 Goal status: INITIAL  3.  Pt will improve gait speed with RW to at least 2.2  ft/sec in order to demo decr fall risk/improved community mobility.  Baseline: 25.75 seconds = 1.27 ft/sec with RW Goal status: INITIAL   4.  Pt will improve 5x sit<>stand to less than or equal to 22 sec without UE support with 3/10 or less fatigue to demonstrate improved functional strength and transfer efficiency.  Baseline: 31.31 sec without UE support, 8/10 fatigue afterwards  Goal status: INITIAL   5.  Pt will ambulate at least 230' with LRAD (potentially cane) around indoor level surfaces with supervision in order to demo improved household mobility.  Baseline: currently using RW with supervision.  Goal status: INITIAL   ASSESSMENT:   CLINICAL IMPRESSION: Today's skilled session focused on addition to HEP for balance with rest breaks required to allow BP to lower after activity (was elevated after walking from lobby to clinic). Pt forgot to take his med's this am, spouse left to go home and get them for him. OT made aware of BP readings for session after PT. Pt is progressing slowly and should benefit from continued PT to progress toward unmet goals.        OBJECTIVE IMPAIRMENTS Abnormal gait, decreased activity tolerance, decreased balance, decreased cognition, decreased coordination, decreased  endurance, decreased knowledge of use of DME, difficulty walking, decreased strength, decreased safety awareness, dizziness, impaired vision/preception, and pain.    ACTIVITY LIMITATIONS cleaning, community activity, and driving.    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   are also affecting patient's functional outcome.      REHAB POTENTIAL: Good   CLINICAL DECISION MAKING: Evolving/moderate complexity   EVALUATION COMPLEXITY: Moderate   PLAN: PT FREQUENCY: 2x/week   PT DURATION: 12 weeks   PLANNED INTERVENTIONS: Therapeutic exercises, Therapeutic activity, Neuromuscular re-education, Balance training, Gait training, Patient/Family education, Stair training, Vestibular training, Visual/preceptual remediation/compensation, and DME instructions   PLAN FOR NEXT SESSION: Monitor BP,  scifit for endurance, toe taps to step, narrow BOS, single leg stance    Willow Ora, PTA, [facility] [address], [place], [place] [address] [phone] 11/16/21, 11:50 AM

## 2021-11-22 ENCOUNTER — Ambulatory Visit: Payer: Medicare PPO | Admitting: Speech Pathology

## 2021-11-22 ENCOUNTER — Ambulatory Visit: Payer: Medicare PPO | Admitting: Occupational Therapy

## 2021-11-22 ENCOUNTER — Ambulatory Visit: Payer: Medicare PPO | Admitting: Physical Therapy

## 2021-11-22 ENCOUNTER — Telehealth: Payer: Self-pay | Admitting: Physical Therapy

## 2021-11-22 ENCOUNTER — Encounter: Payer: Self-pay | Admitting: Speech Pathology

## 2021-11-22 VITALS — BP 163/97 | HR 67

## 2021-11-22 VITALS — BP 182/99 | HR 63

## 2021-11-22 DIAGNOSIS — R41840 Attention and concentration deficit: Secondary | ICD-10-CM

## 2021-11-22 DIAGNOSIS — R41842 Visuospatial deficit: Secondary | ICD-10-CM | POA: Diagnosis not present

## 2021-11-22 DIAGNOSIS — R2689 Other abnormalities of gait and mobility: Secondary | ICD-10-CM | POA: Diagnosis not present

## 2021-11-22 DIAGNOSIS — R41844 Frontal lobe and executive function deficit: Secondary | ICD-10-CM

## 2021-11-22 DIAGNOSIS — Z9181 History of falling: Secondary | ICD-10-CM | POA: Diagnosis not present

## 2021-11-22 DIAGNOSIS — R2681 Unsteadiness on feet: Secondary | ICD-10-CM

## 2021-11-22 DIAGNOSIS — R278 Other lack of coordination: Secondary | ICD-10-CM

## 2021-11-22 DIAGNOSIS — M6281 Muscle weakness (generalized): Secondary | ICD-10-CM

## 2021-11-22 DIAGNOSIS — R471 Dysarthria and anarthria: Secondary | ICD-10-CM

## 2021-11-22 DIAGNOSIS — R41841 Cognitive communication deficit: Secondary | ICD-10-CM

## 2021-11-22 NOTE — Therapy (Signed)
OUTPATIENT PHYSICAL THERAPY TREATMENT NOTE- ARRIVED NO CHARGE   Patient Name: Nicholas Mckenzie MRN: [id number] DOB:September 11, 1945, 76 y.o., male Today's Date: 11/22/2021  PCP: [name], MD   REFERRING PROVIDER: [name], MD    END OF SESSION:   PT End of Session - 11/22/21 0805     Visit Number 3   Arrived no charge   Number of Visits 17    Date for PT Re-Evaluation 02/04/22    Authorization Type [employer]    PT Start Time 0806   Pt using restroom   PT Stop Time 0837    PT Time Calculation (min) 31 min    Equipment Utilized During Treatment --    Activity Tolerance Treatment limited secondary to medical complications (Comment)   Hypertension   Behavior During Therapy WFL for tasks assessed/performed              Past Medical History:  Diagnosis Date   CKD (chronic kidney disease) stage 3, GFR 30-59 ml/min ([place])    Hypertension    Kidney stones    Sleep apnea    Past Surgical History:  Procedure Laterality Date   CATARACT EXTRACTION     IR NEPHROSTOMY EXCHANGE RIGHT  06/07/2021   IR NEPHROSTOMY EXCHANGE RIGHT  08/22/2021   IR NEPHROSTOMY PLACEMENT RIGHT  04/07/2021   IR PATIENT EVAL TECH 0-60 MINS  06/02/2021   kidney stent     MANDIBLE FRACTURE SURGERY     Patient Active Problem List   Diagnosis Date Noted   Hypernatremia 08/23/2021   Malnutrition of moderate degree 08/17/2021   Acute respiratory failure with hypoxia ([place]) 08/16/2021   Severe sepsis ([place]) 08/16/2021   Sore throat 08/16/2021   Hx of subdural hematoma 08/16/2021   Nephrolithiasis 08/16/2021   Stage 3a chronic kidney disease (CKD) ([place]) 08/16/2021   Obesity (BMI 30-39.9) 08/16/2021   OSA (obstructive sleep apnea) 08/16/2021   Aspiration pneumonia ([place]) 08/15/2021   Snoring 08/08/2021   Frequent urination 06/07/2021   Acute lower UTI 06/07/2021   Ascending aortic aneurysm ([place]) 06/07/2021   Atrophy of right kidney 06/07/2021   Anemia 06/07/2021   Obesity (BMI 30.0-34.9)  06/07/2021   Subdural hematoma ([place]) 06/06/2021   COVID-19 virus infection 06/06/2021   Hypokalemia 06/06/2021   H/O ischemic right PCA stroke 06/06/2021   History of cardioembolic cerebrovascular accident (CVA) 06/06/2021   Right-sided cerebrovascular accident (CVA) ([place]) 04/14/2021   Nonfunctioning kidney 04/14/2021   Goals of care, counseling/discussion 04/03/2021   Ureteral stent occlusion ([place]) 04/02/2021   CKD (chronic kidney disease) stage 3a 03/11/2021   Major depressive disorder, recurrent episode, in partial remission ([place]) 03/11/2021   Hypertension    Sleep apnea    GAD (generalized anxiety disorder) 07/20/2019    REFERRING DIAG:  [id number] (ICD-10-CM) - Personal history of transient ischemic attack (TIA), and cerebral infarction without residual deficits     THERAPY DIAG:  Other abnormalities of gait and mobility  Rationale for Evaluation and Treatment Rehabilitation  PERTINENT HISTORY: Admitted 08/15/21 with SOB and tachycardia with possibly choking on pill. Pt with acute respiratory failure. PMhx. 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    PRECAUTIONS: Fall  SUBJECTIVE: Reports HEP is going well, not too easy and not too difficult. No new falls.   PAIN:  Are you having pain? Yes NPRS scale: 2/10 Pain location: right shoulder Pain orientation: Other: right   PAIN TYPE:  chronic Pain description: constant and aching  Aggravating factors: past falls that have caused issues; overuse or use of power tools aggravates it Relieving factors: has had therapy in the past, medicated rub  VITALS: All BP readings taken in LUE while seated  With Machine:  -Pre-session: BP 163/97 mmHg, HR 67 bpm (pt reports he just took his BP meds)  -Taken 5 min later: BP 164/100 mmHg, HR 67 bpm Taken manually: -BP 162/97 mmHg    OBJECTIVE:   COGNITION: Overall cognitive  status: Impaired: See speech eval for further details      TODAY'S TREATMENT: Pt reports taking Amlodipine 30 minutes prior to session, but BP still elevated throughout session. Pt states when he checks it at home, it typically is too high (>160/100 mmHg). Sent message to pt's PCP and encouraged pt to call her when he gets home. Informed pt to check his BP Friday morning prior to coming to clinic for therapies and if BP is >160/95 mmHg, to call and cancel PT appointment. Educated pt on increased risk of having another CVA/TIA with elevated BP and encouraged pt to go to ER immediately if he experiences signs/symptoms of a stroke (BEFAST). Pt verbalized understanding.       PATIENT EDUCATION: Education details: contacting PCP regarding elevated BP, BEFAST, going to ER if BP remains too high   Person educated: Patient and Spouse Education method: Explanation Education comprehension: verbalized understanding     HOME EXERCISE PROGRAM: Access Code: [email or web address] URL: [email or web address] Date: 11/16/2021 Prepared by: Willow Ora  Exercises - Sit to Stand Without Arm Support  - 1 x daily - 7 x weekly - 3 sets - 10 reps  Added to HEP today - Heel Toe Raises with Counter Support  - 1 x daily - 5 x weekly - 1 sets - 10 reps - Standing Hip Abduction with Counter Support  - 1 x daily - 5 x weekly - 1 sets - 10 reps - Standing Single Leg Stance with Counter Support  - 1 x daily - 5 x weekly - 1 sets - 3 reps - 15 seconds hold - Standing in corner with eyes closed  - 1 x daily - 5 x weekly - 1 sets - 3 reps - 30 seconds hold     GOALS: Goals reviewed with patient? Yes   SHORT TERM GOALS: Target date: 12/04/2021      Pt will be independent with initial HEP in order to build upon functional gains made in therapy.   Baseline: Goal status: INITIAL   2.  Pt will improve Berg score to 33/56 for decreased fall risk  Baseline: 28/56 on 5/22 Goal status: INITIAL   3.  Pt will improve  gait speed with RW to at least 1.7  ft/sec in order to demo decr fall risk.    Baseline: 25.75 seconds = 1.27 ft/sec with RW Goal status: INITIAL   4.  Pt will improve TUG time to 19 seconds or less with RW in order to demo decrease fall risk.   Baseline: 23.03 sec with RW Goal status: INITIAL   5.  Pt will improve 5x sit<>stand to less than or equal to 27 sec without UE support with 6/10 or less fatigue to demonstrate improved functional strength and transfer efficiency.    Baseline: 31.3 seconds 8/10 fatigue.  Goal status: INITIAL       LONG TERM GOALS: Target date: 01/01/2022     Pt will be independent with final HEP in order to  build upon functional gains made in therapy. Baseline:  Goal status: INITIAL   2.  Pt will improve Berg score to 38/56 for decreased fall risk  Baseline: 28/56 on 5/22 Goal status: INITIAL   3.  Pt will improve gait speed with RW to at least 2.2  ft/sec in order to demo decr fall risk/improved community mobility.  Baseline: 25.75 seconds = 1.27 ft/sec with RW Goal status: INITIAL   4.  Pt will improve 5x sit<>stand to less than or equal to 22 sec without UE support with 3/10 or less fatigue to demonstrate improved functional strength and transfer efficiency.  Baseline: 31.31 sec without UE support, 8/10 fatigue afterwards  Goal status: INITIAL   5.  Pt will ambulate at least 230' with LRAD (potentially cane) around indoor level surfaces with supervision in order to demo improved household mobility.  Baseline: currently using RW with supervision.  Goal status: INITIAL   ASSESSMENT:   CLINICAL IMPRESSION: Session limited due to pt's elevated BP. Pt's wife not present for session to confirm pt's statements. Pt reports his BP stays >160/100 mmHg at home even with Amlodipine. Encouraged pt to call PCP today when he gets home for medication management and therapist sent message to PCP to make her aware of high readings. Informed pt to check BP Friday  morning prior to coming to clinic and if BP is above 160/95 mmHg, to call and cancel PT appointment. Made SLP and OT aware of plan.     OBJECTIVE IMPAIRMENTS Abnormal gait, decreased activity tolerance, decreased balance, decreased cognition, decreased coordination, decreased endurance, decreased knowledge of use of DME, difficulty walking, decreased strength, decreased safety awareness, dizziness, impaired vision/preception, and pain.    ACTIVITY LIMITATIONS cleaning, community activity, and driving.    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   are also affecting patient's functional outcome.      REHAB POTENTIAL: Good   CLINICAL DECISION MAKING: Evolving/moderate complexity   EVALUATION COMPLEXITY: Moderate   PLAN: PT FREQUENCY: 2x/week   PT DURATION: 12 weeks   PLANNED INTERVENTIONS: Therapeutic exercises, Therapeutic activity, Neuromuscular re-education, Balance training, Gait training, Patient/Family education, Stair training, Vestibular training, Visual/preceptual remediation/compensation, and DME instructions   PLAN FOR NEXT SESSION: Monitor BP,  scifit for endurance, toe taps to step, narrow BOS, single leg stance, tandem walking, monster walks    Britton Bera E Mattison Golay, PT, DPT 11/22/21, 8:49 AM

## 2021-11-22 NOTE — Patient Instructions (Addendum)
  Nashua's BP was too high for PT today. It was 168/100. Please call Dr. Jacqulyn Bath Re: BP.  Larita Fife - check your BP before your 7:15 PT appointment on Friday - if it is above 160/95, you will need to cancel your PT  [place] in [place] has a great car show - it is worth looking up - they just had it, so maybe this time next year  3 ring binder to therapy - put in all exercises, HW, and therapy notes  Have Franky write down his BP 1-2x a day - keep in binder  With pills, consider a darker or bright colored saucer or small cup/bowl so he can see all of the pills with the contrast of the color and lighter pills, cue him to double check that he got all of the pills out of the cup  Manvir says he has started to do some chores and is using a to do list - let us know if this is working or not...  We continue to work on [employer] - you can review this with him:  [place], you are not aware that you are not making safe decisions - this is normal after a stroke - your wife and therapists see that you have some cognitive changes that are affecting your safety awareness. This is frustrating for you, but you need to listen to [name] right now.  Remember you have a walker now, which tells me that you are having some balance issues right now. In parking lots and stores, you need to stop, think and go slow.

## 2021-11-22 NOTE — Therapy (Signed)
OUTPATIENT SPEECH LANGUAGE PATHOLOGY SPEECH AND COGNITIVE EVALUATION   Patient Name: Nicholas Mckenzie MRN: [id number] DOB:03/01/1946, 76 y.o., male Today's Date: 11/22/2021  PCP: Nicholas Jewel, MD  REFERRING PROVIDER: [name], MD    End of Session - 11/22/21 0845     Visit Number 3    Number of Visits 25    Date for SLP Re-Evaluation 01/29/22    SLP Start Time 0845    SLP Stop Time  0930    SLP Time Calculation (min) 45 min    Activity Tolerance Patient tolerated treatment well             Past Medical History:  Diagnosis Date   CKD (chronic kidney disease) stage 3, GFR 30-59 ml/min ([place])    Hypertension    Kidney stones    Sleep apnea    Past Surgical History:  Procedure Laterality Date   CATARACT EXTRACTION     IR NEPHROSTOMY EXCHANGE RIGHT  06/07/2021   IR NEPHROSTOMY EXCHANGE RIGHT  08/22/2021   IR NEPHROSTOMY PLACEMENT RIGHT  04/07/2021   IR PATIENT EVAL TECH 0-60 MINS  06/02/2021   kidney stent     MANDIBLE FRACTURE SURGERY     Patient Active Problem List   Diagnosis Date Noted   Hypernatremia 08/23/2021   Malnutrition of moderate degree 08/17/2021   Acute respiratory failure with hypoxia ([place]) 08/16/2021   Severe sepsis ([place]) 08/16/2021   Sore throat 08/16/2021   Hx of subdural hematoma 08/16/2021   Nephrolithiasis 08/16/2021   Stage 3a chronic kidney disease (CKD) ([place]) 08/16/2021   Obesity (BMI 30-39.9) 08/16/2021   OSA (obstructive sleep apnea) 08/16/2021   Aspiration pneumonia ([place]) 08/15/2021   Snoring 08/08/2021   Frequent urination 06/07/2021   Acute lower UTI 06/07/2021   Ascending aortic aneurysm ([place]) 06/07/2021   Atrophy of right kidney 06/07/2021   Anemia 06/07/2021   Obesity (BMI 30.0-34.9) 06/07/2021   Subdural hematoma ([place]) 06/06/2021   COVID-19 virus infection 06/06/2021   Hypokalemia 06/06/2021   H/O ischemic right PCA stroke 06/06/2021   History of cardioembolic cerebrovascular accident (CVA) 06/06/2021   Right-sided  cerebrovascular accident (CVA) ([place]) 04/14/2021   Nonfunctioning kidney 04/14/2021   Goals of care, counseling/discussion 04/03/2021   Ureteral stent occlusion ([place]) 04/02/2021   CKD (chronic kidney disease) stage 3a 03/11/2021   Major depressive disorder, recurrent episode, in partial remission ([place]) 03/11/2021   Hypertension    Sleep apnea    GAD (generalized anxiety disorder) 07/20/2019    ONSET DATE: 08/16/21   REFERRING DIAG: [id number] (ICD-10-CM) - Personal history of transient ischemic attack (TIA), and cerebral infarction without residual deficits   THERAPY DIAG:  Cognitive communication Dysarthria and Anarthria  SUBJECTIVE:   SUBJECTIVE STATEMENT: "Oh, just hanging around the house" Pt accompanied by: significant other Spouse Nicholas Mckenzie  PAIN:  Are you having pain? No  OBJECTIVE:   TODAY'S TREATMENT:   11-22-21:   [place] arrives alone. He reports he has completed chores including small laundry, putting up his clothes, and is supposed to clean the toilet. He states he is using a to do list. Targeted safety awareness, Nicholas Mckenzie continues to require frequent mod verbal cues to ID safety concerns due to cognitive  & physical impairments. He continues to verbalize wanting to drive, work on cars, get underneath cars etc. We reviewed that he fell trying to jump a truck in the drive. Problem solving targeted IDing unsafe decisions in photos and correcting safety issues in the photos with occasional min questioning  cues. He did verbalize that Nicholas Mckenzie gets nervous when he is walking in a parking lot because she fears he will run into cars with his walker - with frequent mod A he verbalized that he has a walker and would need to be more mindful about looking a head and planning a safe route through the lot. Nicholas Mckenzie arrived with BP too high for PT. Wrote note home to check BP before next PT appointment on Friday and provided at BP log with written instructions to write BP 1-2x a day and bring this log in his  therapy binder. Wrote note with possible solution for leaving meds untaken to use a bright or dark colored small cup or saucer to put the meds in then to help him see all of the pills, then cue Nicholas Mckenzie to double check if he got them all to get in the habit of double checking. See pt instructions    11-16-21: Target improved safety and awareness through instruction of Stop-Think-Do meta-cognitive strategy instruction. Following education, pt generated x3 potential chores he could do to contribute to household and reduce burden on Nicholas Mckenzie. Mod-I with sequencing steps of target activity, max-A for ID potential safety concerns or barriers to completion. Max-A for solutions or compensations to mitigate mentioned problems. SLP suggests that pt attempt to complete targeted activities with supervision from Nicholas Mckenzie with cueing as needed to implement compensations or strategies (e.g. having tools available, ensuring having something to hold on to, taking small loads of clothes to laundry room, sitting while folding load of laundry). Nicholas Mckenzie tells ST that despite sitting out meds for patient in AM, he doesn't always take and she will find some on floor. Plan to generate alternative method of pill administration to increase pt's independence in future session.    PATIENT EDUCATION: Education details: areas of impairment, cognitive activities to do at home Person educated: Patient and Spouse Education method: Explanation, Verbal cues, and Handouts Education comprehension: verbal cues required and needs further education   ASSESSMENT:  CLINICAL IMPRESSION: Patient is a 76 y.o. MALE who was seen today for  cognition s/p CVA. . Initiated compensatory strategies for attention, awareness and memory to complete simple household tasks, use of to do list as well. Awareness of cognitive and physical impairments continues to affect Nicholas Mckenzie's safety and decision making, he required frequent mod A to ID safety concerns with working on cars,  in the kitchen etc. Ongoing cues to point out his walker and how his reduced awareness of poor balance and attention can result in unsafe decisions.  I recommend skilled ST to maximize cognition and train in compensatory strategies for cognition for safety and to reduce caregiver burden.   OBJECTIVE IMPAIRMENTS include attention, memory, awareness, and executive functioning. These impairments are limiting patient from managing medications, managing appointments, managing finances, household responsibilities, and ADLs/IADLs. Factors affecting potential to achieve goals and functional outcome are ability to learn/carryover information. Patient will benefit from skilled SLP services to address above impairments and improve overall function.  REHAB POTENTIAL: Good   GOALS: Goals reviewed with patient? Yes  SHORT TERM GOALS: Target date: 12/04/2021    Pt will use external aids to recall am meds with occasional min A from [place] over 1 week Baseline: Goal status: IN PROGRESS  2.  Pt will use external aids to recall appointments with occasional min A from spouse over 1 week Baseline:  Goal status: IN PROGRESS  3.  Pt will use to do list or other memory aid to complete  2 household chores 5/7 days a week with occasional min A from spouse Baseline:  Goal status: IN PROGRESS  4.  Pt will verbalize intellectual awareness of 3 cognitive impairments with rare min A Baseline:  Goal status: IN PROGRESS  5.  Pt will verbalize 3 safety concerns re: mobility and cognition in the home with rare min A Baseline:  Goal status: IN PROGRESS  6.  Complete CLQT Baseline:  Goal status: IN PROGRESS  LONG TERM GOALS: Target date: 01/29/2022    Pt will use external aids to recall meds and appointments with rare min A over 1 week Baseline:  Goal status: IN PROGRESS  2.  Pt will use memory book or other aid to recall pertinent information, messages and conversations 3x over 1 week with occasional min A from  spouse Baseline:  Goal status: IN PROGRESS  3.  Pt will complete 3 house hold chores 5/7 days with to do list or other external aid Baseline:  Goal status: IN PROGRESS  4. Pt will carryover compensations for dysarthria to be 100% intelligible over 20 minute conversation with mod I   PLAN: SLP FREQUENCY: 2x/week  SLP DURATION: 12 weeks  PLANNED INTERVENTIONS: Language facilitation, Environmental controls, Cueing hierachy, Cognitive reorganization, Internal/external aids, Functional tasks, Multimodal communication approach, SLP instruction and feedback, Compensatory strategies, and Patient/family education    Iline Oven, [place] 11/22/2021, 9:46 AM

## 2021-11-22 NOTE — Therapy (Signed)
OUTPATIENT OCCUPATIONAL THERAPY TREATMENT NOTE   Patient Name: Nicholas Mckenzie MRN: [id number] DOB:1946-02-20, 76 y.o., male Today's Date: 11/22/2021  PCP: Dr. Doristine Bosworth REFERRING PROVIDER: Dr Doristine Bosworth  END OF SESSION:   OT End of Session - 11/22/21 0948     Visit Number 3    Number of Visits 9    Date for OT Re-Evaluation 12/11/21    Authorization Type [employer]    OT Start Time [phone]    OT Stop Time 1015    OT Time Calculation (min) 39 min    Activity Tolerance Patient tolerated treatment well    Behavior During Therapy [facility] for tasks assessed/performed;Impulsive             Past Medical History:  Diagnosis Date   CKD (chronic kidney disease) stage 3, GFR 30-59 ml/min ([facility])    Hypertension    Kidney stones    Sleep apnea    Past Surgical History:  Procedure Laterality Date   CATARACT EXTRACTION     IR NEPHROSTOMY EXCHANGE RIGHT  06/07/2021   IR NEPHROSTOMY EXCHANGE RIGHT  08/22/2021   IR NEPHROSTOMY PLACEMENT RIGHT  04/07/2021   IR PATIENT EVAL TECH 0-60 MINS  06/02/2021   kidney stent     MANDIBLE FRACTURE SURGERY     Patient Active Problem List   Diagnosis Date Noted   Hypernatremia 08/23/2021   Malnutrition of moderate degree 08/17/2021   Acute respiratory failure with hypoxia ([facility]) 08/16/2021   Severe sepsis ([place]) 08/16/2021   Sore throat 08/16/2021   Hx of subdural hematoma 08/16/2021   Nephrolithiasis 08/16/2021   Stage 3a chronic kidney disease (CKD) ([place]) 08/16/2021   Obesity (BMI 30-39.9) 08/16/2021   OSA (obstructive sleep apnea) 08/16/2021   Aspiration pneumonia ([place]) 08/15/2021   Snoring 08/08/2021   Frequent urination 06/07/2021   Acute lower UTI 06/07/2021   Ascending aortic aneurysm ([place]) 06/07/2021   Atrophy of right kidney 06/07/2021   Anemia 06/07/2021   Obesity (BMI 30.0-34.9) 06/07/2021   Subdural hematoma ([place]) 06/06/2021   COVID-19 virus infection 06/06/2021   Hypokalemia 06/06/2021   H/O ischemic right PCA stroke 06/06/2021    History of cardioembolic cerebrovascular accident (CVA) 06/06/2021   Right-sided cerebrovascular accident (CVA) ([place]) 04/14/2021   Nonfunctioning kidney 04/14/2021   Goals of care, counseling/discussion 04/03/2021   Ureteral stent occlusion ([place]) 04/02/2021   CKD (chronic kidney disease) stage 3a 03/11/2021   Major depressive disorder, recurrent episode, in partial remission ([place]) 03/11/2021   Hypertension    Sleep apnea    GAD (generalized anxiety disorder) 07/20/2019    ONSET DATE: 11/02/2021 (date of referral)      REFERRING DIAG: Z86.73 (ICD-10-CM) - Personal history of transient ischemic attack (TIA), and cerebral infarction without residual deficits  THERAPY DIAG:  Other abnormalities of gait and mobility  Unsteadiness on feet  Muscle weakness (generalized)  Other lack of coordination  Frontal lobe and executive function deficit  Attention and concentration deficit  Visuospatial deficit  Rationale for Evaluation and Treatment Rehabilitation  PERTINENT HISTORY: Admitted 08/15/21 to the hospital with SOB and tachycardia with possibly choking on pill. Pt with acute respiratory failure. PMhx. 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   brain MRI 04/05/21 with multifocal acute ischemia both cerebral hemispheres (R>L), L middle cerebellar peduncle and inferior L cerebellum ischemia. Pt is having surgery to remove kidney 12/08/21, he will be hospitalized   PRECAUTIONS: Fall and Other: drain,  pt is having kidney removed next month  SUBJECTIVE: Pt in OT following ST today. Pt had to leave PT today with no session d/t elevated blood pressure.    Today's Vitals   11/22/21 0944  BP: (!) 182/99  Pulse: 63   There is no height or weight on file to calculate BMI.   PAIN:  Are you having pain? No Chronic RTC tear in RUE pain (1-2/10)     OBJECTIVE:   TODAY'S  TREATMENT: 11/22/21 Walking Navigation/Safety - pt ambulated from ST to restroom and then to therapy gym with mod/max cues for safety with walker management.Pt req'd physical, visual and verbal assistance for managing heavy bathroom door and managing walker with going to the restroom. Pt reached to get hand sanitizer however abandoned rollator and pivoted to the hand sanitizer on wall with using wall for assistance with LOB x 2  Table slides x 10 reps for warm up PVC pipe tree - with min cues for scanning and sorting pieces into appropriate groups/categories. Pt followed pattern 8 with 100% accuracy. Pt initially missed right side of image however self-corrected upon checking work. Pt completed fig 16 with increased time and good accuracy.    GOALS:     LONG TERM GOALS: Target date: 12/11/21   Pt/caregiver will be I with HEP   Goal status: ONGOING   2.  Pt/ caregiver will be I with visual compensation strategies   Goal status: ONGOING   3.  Pt will demonstrate improved fine motor coordination as evidenced by decreasing RUE 9 hole peg test score to 70 secs or less. Baseline: 75 secs Goal status: ONGOING   4.  Pt will perform tabletop scanning tasks with a cognitive component with 85% or better accuracy.   Goal status: ONGOING   5.  Pt will navigate a mod distracting environment and locate items with 80% or better accuracy, with no more than min v.c for safety.   Goal status: ONGOING   6.  Pt will retrieve a lightweight object at 110* shoulder flexion with RUE, with pain no greater than 3/10.  Baseline: 105, pain 4/10 Goal status: ONGOING  7.  Pt / caregiver will verbalize understanding of strategies to increase pt safety with ADLs/IADLS and to minimize pain.   Goal status: ONGOING       ASSESSMENT:   CLINICAL IMPRESSION: Patient has verbalized understanding of goals. Pt with good scanning pattern today for tabletop scanning. Continue to progress towards goals.      PERFORMANCE DEFICITS in functional skills including ADLs, IADLs, coordination, dexterity, edema, ROM, strength, pain, flexibility, FMC, GMC, mobility, balance, endurance, continence, decreased knowledge of use of DME, vision, and UE functional use, cognitive skills including attention, learn, memory, perception, problem solving, safety awareness, sequencing, temperament/personality, thought, and understand, and psychosocial skills including coping strategies, environmental adaptation, habits, interpersonal interactions, and routines and behaviors.    IMPAIRMENTS are limiting patient from ADLs, IADLs, work, play, leisure, and social participation.    COMORBIDITIES may have co-morbidities  that affects occupational performance. Patient will benefit from skilled OT to address above impairments and improve overall function.   MODIFICATION OR ASSISTANCE TO COMPLETE EVALUATION: Min-Moderate modification of tasks or assist with assess necessary to complete an evaluation.   OT OCCUPATIONAL PROFILE AND HISTORY: Detailed assessment: Review of records and additional review of physical, cognitive, psychosocial history related to current functional performance.   CLINICAL DECISION MAKING: Moderate - several treatment options, min-mod task modification necessary   REHAB POTENTIAL: Good  EVALUATION COMPLEXITY: Moderate     PLAN: OT FREQUENCY: 2x/week   OT DURATION: 4 weeks- POC written for 4 weeks only as pt will be hospitalized for surgery to remove kidney   PLANNED INTERVENTIONS: self care/ADL training, therapeutic exercise, therapeutic activity, neuromuscular re-education, manual therapy, manual lymph drainage, passive range of motion, gait training, balance training, functional mobility training, splinting, electrical stimulation, ultrasound, paraffin, fluidotherapy, moist heat, cryotherapy, contrast bath, patient/family education, cognitive remediation/compensation, visual/perceptual  remediation/compensation, energy conservation, coping strategies training, and DME and/or AE instructions   RECOMMENDED OTHER SERVICES: PT, ST   CONSULTED AND AGREED WITH PLAN OF CARE: Patient and family member/caregiver   PLAN FOR NEXT SESSION: further assess vision in functional context, HEP for coordination and gentle shoulder ROM, hx of RC injuries bilateral, tabletop scanning, safety ambulating in environment   [employer], OT 11/22/2021, 10:18 AM

## 2021-11-22 NOTE — Telephone Encounter (Signed)
Dr. Doristine Bosworth,   I have been seeing Nicholas Mckenzie for physical therapy at [place] and his BP has consistently been > 160/100 mmHg even with him taking his Amlodipine. Mr. Drabik states that his BP has consistently been high, even at home.   I educated him on the risks of another CVA/TIA with elevated BP and encouraged him to go to the ER if he experiences symptoms.   At our clinic, we cannot treat patients if their BP is above 160/100 mmHg due to the associated health risks. I wanted to make you aware, as the patient states he forgets to call and schedule an appointment with you.   Thanks,  Charlett Nose, PT, [place] [address] [place], [place]  [address] Phone:  [phone] Fax:  [phone]

## 2021-11-24 ENCOUNTER — Ambulatory Visit: Payer: Medicare PPO | Admitting: Occupational Therapy

## 2021-11-24 ENCOUNTER — Ambulatory Visit: Payer: Medicare PPO | Admitting: Physical Therapy

## 2021-11-27 ENCOUNTER — Ambulatory Visit: Payer: Medicare PPO | Admitting: Physical Therapy

## 2021-11-27 ENCOUNTER — Encounter: Payer: Medicare PPO | Admitting: Occupational Therapy

## 2021-11-27 DIAGNOSIS — B952 Enterococcus as the cause of diseases classified elsewhere: Secondary | ICD-10-CM | POA: Diagnosis not present

## 2021-11-27 DIAGNOSIS — I498 Other specified cardiac arrhythmias: Secondary | ICD-10-CM | POA: Diagnosis not present

## 2021-11-27 DIAGNOSIS — Z8673 Personal history of transient ischemic attack (TIA), and cerebral infarction without residual deficits: Secondary | ICD-10-CM | POA: Diagnosis not present

## 2021-11-27 DIAGNOSIS — I129 Hypertensive chronic kidney disease with stage 1 through stage 4 chronic kidney disease, or unspecified chronic kidney disease: Secondary | ICD-10-CM | POA: Diagnosis not present

## 2021-11-27 DIAGNOSIS — F32A Depression, unspecified: Secondary | ICD-10-CM | POA: Diagnosis not present

## 2021-11-27 DIAGNOSIS — G4733 Obstructive sleep apnea (adult) (pediatric): Secondary | ICD-10-CM | POA: Diagnosis not present

## 2021-11-27 DIAGNOSIS — N1330 Unspecified hydronephrosis: Secondary | ICD-10-CM | POA: Diagnosis not present

## 2021-11-27 DIAGNOSIS — Z01818 Encounter for other preprocedural examination: Secondary | ICD-10-CM | POA: Diagnosis not present

## 2021-11-27 DIAGNOSIS — N390 Urinary tract infection, site not specified: Secondary | ICD-10-CM | POA: Diagnosis not present

## 2021-11-27 DIAGNOSIS — N189 Chronic kidney disease, unspecified: Secondary | ICD-10-CM | POA: Diagnosis not present

## 2021-11-28 ENCOUNTER — Encounter: Payer: Self-pay | Admitting: Physician Assistant

## 2021-11-28 ENCOUNTER — Telehealth: Payer: Self-pay | Admitting: Physician Assistant

## 2021-11-28 ENCOUNTER — Ambulatory Visit (HOSPITAL_COMMUNITY)
Admission: RE | Admit: 2021-11-28 | Discharge: 2021-11-28 | Disposition: A | Discharge status: Home / Self Care | Payer: Medicare PPO | Source: Ambulatory Visit | Attending: Interventional Radiology | Admitting: Interventional Radiology

## 2021-11-28 DIAGNOSIS — Z8673 Personal history of transient ischemic attack (TIA), and cerebral infarction without residual deficits: Secondary | ICD-10-CM | POA: Diagnosis not present

## 2021-11-28 DIAGNOSIS — G4733 Obstructive sleep apnea (adult) (pediatric): Secondary | ICD-10-CM | POA: Diagnosis not present

## 2021-11-28 DIAGNOSIS — T83022A Displacement of nephrostomy catheter, initial encounter: Secondary | ICD-10-CM | POA: Diagnosis not present

## 2021-11-28 DIAGNOSIS — I10 Essential (primary) hypertension: Secondary | ICD-10-CM | POA: Diagnosis not present

## 2021-11-28 DIAGNOSIS — N1339 Other hydronephrosis: Secondary | ICD-10-CM

## 2021-11-28 NOTE — Telephone Encounter (Signed)
Patient presented to [facility] IR with wife who states right PCN came out completely yesterday. Patient denies any pain, small amount of clear yellow leakage from the site which has bandage placed over it. Drain site is clean, dry, dressed appropriately without erythema, edema or bleeding. Site non-tender to palpation.   Per patient's wife he is scheduled for right nephrectomy 12/08/21 with Dr. Luane School (urology - [facility]).   Discussed with IR attending Dr. Elby Showers who recommends discussing with Dr. Luane School regarding need for replacement given upcoming surgery in 10 days. If replacement is desired patient will need an order from urologist for CT abd/pelvis w/o contrast to assess ability to replace PCN, if able to replace patient will need to return to IR as an outpatient.   Discussed with patient's urologist Dr. Luane School who states PCN does not need to be replaced at this time, they would like patient to keep clean, dry and dressed until surgery. If patient has s/s in the interim he will need to present to the ED.  Called patient's wife [name] at 16:08 and explained the above to which she states understanding.  No further IR needs at this time however IR remains available as needed.  Lynnette Caffey, PA-C

## 2021-11-29 ENCOUNTER — Ambulatory Visit: Payer: Medicare PPO | Admitting: Physical Therapy

## 2021-11-29 ENCOUNTER — Ambulatory Visit: Payer: Medicare PPO | Admitting: Speech Pathology

## 2021-11-29 ENCOUNTER — Ambulatory Visit: Payer: Medicare PPO | Admitting: Occupational Therapy

## 2021-12-01 ENCOUNTER — Ambulatory Visit (HOSPITAL_COMMUNITY): Payer: Medicare PPO

## 2021-12-04 ENCOUNTER — Ambulatory Visit: Payer: Medicare PPO | Admitting: Speech Pathology

## 2021-12-04 ENCOUNTER — Ambulatory Visit: Payer: Medicare PPO | Admitting: Occupational Therapy

## 2021-12-04 ENCOUNTER — Ambulatory Visit: Payer: Medicare PPO | Admitting: Physical Therapy

## 2021-12-06 ENCOUNTER — Ambulatory Visit: Payer: Medicare PPO | Admitting: Speech Pathology

## 2021-12-06 ENCOUNTER — Ambulatory Visit: Payer: Medicare PPO | Admitting: Occupational Therapy

## 2021-12-06 ENCOUNTER — Ambulatory Visit: Payer: Medicare PPO | Admitting: Physical Therapy

## 2021-12-08 DIAGNOSIS — N1339 Other hydronephrosis: Secondary | ICD-10-CM | POA: Diagnosis not present

## 2021-12-08 DIAGNOSIS — N132 Hydronephrosis with renal and ureteral calculous obstruction: Secondary | ICD-10-CM | POA: Diagnosis not present

## 2021-12-08 DIAGNOSIS — N1330 Unspecified hydronephrosis: Secondary | ICD-10-CM | POA: Diagnosis not present

## 2021-12-08 DIAGNOSIS — N261 Atrophy of kidney (terminal): Secondary | ICD-10-CM | POA: Diagnosis not present

## 2021-12-08 DIAGNOSIS — E669 Obesity, unspecified: Secondary | ICD-10-CM | POA: Diagnosis not present

## 2021-12-08 DIAGNOSIS — N210 Calculus in bladder: Secondary | ICD-10-CM | POA: Diagnosis not present

## 2021-12-08 DIAGNOSIS — I129 Hypertensive chronic kidney disease with stage 1 through stage 4 chronic kidney disease, or unspecified chronic kidney disease: Secondary | ICD-10-CM | POA: Diagnosis not present

## 2021-12-08 DIAGNOSIS — N2881 Hypertrophy of kidney: Secondary | ICD-10-CM | POA: Diagnosis not present

## 2021-12-08 DIAGNOSIS — G4733 Obstructive sleep apnea (adult) (pediatric): Secondary | ICD-10-CM | POA: Diagnosis not present

## 2021-12-08 DIAGNOSIS — F32A Depression, unspecified: Secondary | ICD-10-CM | POA: Diagnosis not present

## 2021-12-08 DIAGNOSIS — D649 Anemia, unspecified: Secondary | ICD-10-CM | POA: Diagnosis not present

## 2021-12-08 DIAGNOSIS — N1831 Chronic kidney disease, stage 3a: Secondary | ICD-10-CM | POA: Diagnosis not present

## 2021-12-08 DIAGNOSIS — Z6833 Body mass index (BMI) 33.0-33.9, adult: Secondary | ICD-10-CM | POA: Diagnosis not present

## 2021-12-15 ENCOUNTER — Other Ambulatory Visit: Payer: Self-pay

## 2021-12-15 ENCOUNTER — Encounter (HOSPITAL_COMMUNITY): Payer: Self-pay | Admitting: Emergency Medicine

## 2021-12-15 ENCOUNTER — Emergency Department (HOSPITAL_COMMUNITY)
Admission: EM | Admit: 2021-12-15 | Discharge: 2021-12-15 | Disposition: A | Discharge status: Home / Self Care | Payer: Medicare PPO | Attending: Emergency Medicine | Admitting: Emergency Medicine

## 2021-12-15 DIAGNOSIS — N390 Urinary tract infection, site not specified: Secondary | ICD-10-CM | POA: Diagnosis not present

## 2021-12-15 DIAGNOSIS — T839XXA Unspecified complication of genitourinary prosthetic device, implant and graft, initial encounter: Secondary | ICD-10-CM

## 2021-12-15 DIAGNOSIS — D649 Anemia, unspecified: Secondary | ICD-10-CM | POA: Diagnosis not present

## 2021-12-15 DIAGNOSIS — R7989 Other specified abnormal findings of blood chemistry: Secondary | ICD-10-CM | POA: Insufficient documentation

## 2021-12-15 DIAGNOSIS — I129 Hypertensive chronic kidney disease with stage 1 through stage 4 chronic kidney disease, or unspecified chronic kidney disease: Secondary | ICD-10-CM | POA: Insufficient documentation

## 2021-12-15 DIAGNOSIS — T83511A Infection and inflammatory reaction due to indwelling urethral catheter, initial encounter: Secondary | ICD-10-CM | POA: Insufficient documentation

## 2021-12-15 DIAGNOSIS — N189 Chronic kidney disease, unspecified: Secondary | ICD-10-CM | POA: Insufficient documentation

## 2021-12-15 DIAGNOSIS — T83098A Other mechanical complication of other indwelling urethral catheter, initial encounter: Secondary | ICD-10-CM | POA: Diagnosis not present

## 2021-12-15 DIAGNOSIS — Z79899 Other long term (current) drug therapy: Secondary | ICD-10-CM | POA: Diagnosis not present

## 2021-12-15 LAB — URINALYSIS, ROUTINE W REFLEX MICROSCOPIC
Bilirubin Urine: NEGATIVE
Glucose, UA: NEGATIVE mg/dL
Hgb urine dipstick: NEGATIVE
Ketones, ur: NEGATIVE mg/dL
Nitrite: NEGATIVE
Protein, ur: 30 mg/dL — AB
Specific Gravity, Urine: 1.015 (ref 1.005–1.030)
pH: 5 (ref 5.0–8.0)

## 2021-12-15 LAB — CBC WITH DIFFERENTIAL/PLATELET
Abs Immature Granulocytes: 0.15 10*3/uL — ABNORMAL HIGH (ref 0.00–0.07)
Basophils Absolute: 0 10*3/uL (ref 0.0–0.1)
Basophils Relative: 0 %
Eosinophils Absolute: 0.7 10*3/uL — ABNORMAL HIGH (ref 0.0–0.5)
Eosinophils Relative: 7 %
HCT: 36.4 % — ABNORMAL LOW (ref 39.0–52.0)
Hemoglobin: 11.7 g/dL — ABNORMAL LOW (ref 13.0–17.0)
Immature Granulocytes: 2 %
Lymphocytes Relative: 15 %
Lymphs Abs: 1.5 10*3/uL (ref 0.7–4.0)
MCH: 28.8 pg (ref 26.0–34.0)
MCHC: 32.1 g/dL (ref 30.0–36.0)
MCV: 89.7 fL (ref 80.0–100.0)
Monocytes Absolute: 0.7 10*3/uL (ref 0.1–1.0)
Monocytes Relative: 8 %
Neutro Abs: 6.5 10*3/uL (ref 1.7–7.7)
Neutrophils Relative %: 68 %
Platelets: 291 10*3/uL (ref 150–400)
RBC: 4.06 MIL/uL — ABNORMAL LOW (ref 4.22–5.81)
RDW: 14.5 % (ref 11.5–15.5)
WBC: 9.6 10*3/uL (ref 4.0–10.5)
nRBC: 0 % (ref 0.0–0.2)

## 2021-12-15 LAB — COMPREHENSIVE METABOLIC PANEL
ALT: 14 U/L (ref 0–44)
AST: 17 U/L (ref 15–41)
Albumin: 3.5 g/dL (ref 3.5–5.0)
Alkaline Phosphatase: 53 U/L (ref 38–126)
Anion gap: 10 (ref 5–15)
BUN: 26 mg/dL — ABNORMAL HIGH (ref 8–23)
CO2: 26 mmol/L (ref 22–32)
Calcium: 9.2 mg/dL (ref 8.9–10.3)
Chloride: 109 mmol/L (ref 98–111)
Creatinine, Ser: 1.59 mg/dL — ABNORMAL HIGH (ref 0.61–1.24)
GFR, Estimated: 45 mL/min — ABNORMAL LOW (ref >60)
Glucose, Bld: 102 mg/dL — ABNORMAL HIGH (ref 70–99)
Potassium: 3.5 mmol/L (ref 3.5–5.1)
Sodium: 145 mmol/L (ref 135–145)
Total Bilirubin: 0.5 mg/dL (ref 0.3–1.2)
Total Protein: 6.9 g/dL (ref 6.5–8.1)

## 2021-12-15 MED ORDER — CEFUROXIME AXETIL 500 MG PO TABS
500.0000 mg | ORAL_TABLET | Freq: Two times a day (BID) | ORAL | 0 refills | Status: AC
Start: 2021-12-15 — End: 2021-12-25

## 2021-12-15 MED ORDER — CEFDINIR 300 MG PO CAPS
300.0000 mg | ORAL_CAPSULE | Freq: Once | ORAL | Status: AC
  Administered 2021-12-15: 300 mg via ORAL
  Filled 2021-12-15: qty 1

## 2021-12-15 NOTE — ED Provider Notes (Signed)
[facility]-EMERGENCY DEPT Provider Note   CSN: [id number] Arrival date & time: 12/15/21  1620     History Chief Complaint  Patient presents with   Needs Foley     Nicholas Mckenzie is a 76 y.o. male with h/o of right nephrectomy, CVA, hypertension, CKD presents the emergency department for evaluation of his Foley catheter.  On 12-08-2021, the patient had a right nephroureterectomy via robotic surgery.  Additionally, he had a catheter placed.  Since his surgery, he has had 4 urinary catheter balloons deflate.  The patient is was to have surgery next week for bladder stone removals.  His other urinary catheters have been replaced by his urologist team, however they did not want to drive to the office from [facility] today, so instead decided to come into the emergency department for placement.  The patient reports that he feels fine.  He only has abdominal pain whenever he coughs or laughs but that is been consistent since surgery.  He denies any discharge from his wound.  His wife reports that he had a elevated temperature of 100 Fahrenheit last night but was okay afterwards.  He reports good urinary output previous to the catheter falling out.  Denies any hematuria.  He denies any nausea or vomiting.  Denies any chest pain or shortness of breath.  HPI     Home Medications Prior to Admission medications   Medication Sig Start Date End Date Taking? Authorizing Provider  acetaminophen (TYLENOL) 325 MG tablet Take 2 tablets (650 mg total) by mouth every 6 (six) hours as needed for mild pain (or Fever >/= 101). Patient not taking: Reported on 11/06/2021 08/25/21   Rodolph Bong, MD  amLODipine (NORVASC) 5 MG tablet Take 1 tablet (5 mg total) by mouth in the morning and at bedtime. Patient taking differently: Take 10 mg by mouth once. Pt reports taking once in AM 06/08/21   Hongalgi, Maximino Greenland, MD  B Complex-C CAPS Take 1 capsule by mouth daily.    [provider]   FLUoxetine (PROZAC) 40 MG capsule Take 40 mg by mouth daily.    [provider]  Ipratropium-Albuterol (COMBIVENT) 20-100 MCG/ACT AERS respimat Inhale 1 puff into the lungs every 6 (six) hours as needed for wheezing or shortness of breath. 08/25/21   Rodolph Bong, MD  lactobacillus (FLORANEX/LACTINEX) PACK Take 1 packet (1 g total) by mouth 2 (two) times daily. 04/13/21   Lanae Boast, MD  melatonin 3 MG TABS tablet Take 3 mg by mouth at bedtime.    [provider]  Multiple Vitamins-Minerals (MULTIVITAMIN WITH MINERALS) tablet Take 1 tablet by mouth daily.    [provider]  Omega-3 Fatty Acids (FISH OIL PO) Take 1 capsule by mouth daily.    [provider]      Allergies    Statins    Review of Systems   Review of Systems  Constitutional:  Positive for fever. Negative for chills.  Respiratory:  Negative for shortness of breath.   Cardiovascular:  Negative for chest pain.  Gastrointestinal:  Positive for abdominal pain. Negative for constipation, diarrhea, nausea and vomiting.  Genitourinary:  Negative for dysuria and hematuria.       Reports catheter problems    Physical Exam Updated Vital Signs BP (!) 149/80   Pulse 79   Temp 99 F (37.2 C) (Rectal)   Resp 18   SpO2 95%  Physical Exam Vitals and nursing note reviewed.  Constitutional:  General: He is not in acute distress.    Appearance: Normal appearance. He is not toxic-appearing.  HENT:     Head: Normocephalic and atraumatic.  Eyes:     General: No scleral icterus. Cardiovascular:     Rate and Rhythm: Normal rate.  Pulmonary:     Effort: Pulmonary effort is normal. No respiratory distress.     Breath sounds: Normal breath sounds.  Abdominal:     General: Bowel sounds are normal.     Palpations: Abdomen is soft.     Tenderness: There is abdominal tenderness. There is no guarding or rebound.     Comments: The patient has six surgical incisions with staples. All are well  appearing without surrounding erythema, fluctuance, or induration. Pink granulous tissue present. Some mild old bruising noted to the right flank/right abdomen. Abdomen is firm, but pliable. Nontender to palpation. Some mild tenderness over her surgical sites. NBS.   Musculoskeletal:        General: No deformity.     Cervical back: Normal range of motion.  Skin:    General: Skin is warm and dry.  Neurological:     General: No focal deficit present.     Mental Status: He is alert. Mental status is at baseline.     ED Results / Procedures / Treatments   Labs (all labs ordered are listed, but only abnormal results are displayed) Labs Reviewed  URINALYSIS, ROUTINE W REFLEX MICROSCOPIC - Abnormal; Notable for the following components:      Result Value   APPearance HAZY (*)    Protein, ur 30 (*)    Leukocytes,Ua MODERATE (*)    Bacteria, UA MANY (*)    All other components within normal limits  CBC WITH DIFFERENTIAL/PLATELET - Abnormal; Notable for the following components:   RBC 4.06 (*)    Hemoglobin 11.7 (*)    HCT 36.4 (*)    Eosinophils Absolute 0.7 (*)    Abs Immature Granulocytes 0.15 (*)    All other components within normal limits  COMPREHENSIVE METABOLIC PANEL - Abnormal; Notable for the following components:   Glucose, Bld 102 (*)    BUN 26 (*)    Creatinine, Ser 1.59 (*)    GFR, Estimated 45 (*)    All other components within normal limits  URINE CULTURE    EKG None  Radiology No results found.  Procedures Procedures   Medications Ordered in ED Medications - No data to display  ED Course/ Medical Decision Making/ A&P                           Medical Decision Making Amount and/or Complexity of Data Reviewed Labs: ordered.  Risk Prescription drug management.   76 year old male presents ration of Foley catheter issues.  Patient has had to have their catheter replaced several times due to the balloon deflated.  He usually goes to his urologist,  however did not want to drive in from [place] today so went to his local emergency department.  Vital signs show mildly elevated blood pressure otherwise normal.  Physical exam shows some well-appearing staples in the abdomen.  Patient is in no acute distress and is feeling well.  We will get some basic labs.  Foley catheter was replaced by nursing staff and had urine return.  I independently reviewed and interpreted the patient's labs.  Urinalysis shows hazy urine with 30 protein, there is moderate amount of leukocytes with 21-50 white blood  cells and many bacteria.  Likely catheter associated UTI.  We will culture the urine.  CMP shows mildly elevated glucose although not fasting.  Patient's creatinine is at his baseline at 1.59 with a slightly elevated BUN at 26.  GFR estimated at 45.  No other electrolyte or LFT abnormalities.  CBC without leukocytosis.  He has a mild anemia at hemoglobin which is a slight decrease from his hemoglobin around 12.2.  Patient has had steady outflow of urine since being in the emergency department.  I will place him on an antibiotic for catheter associated UTI with cefuroxime twice daily.  Patient reports he follows up with his urologist soon.  I suggested that he contact him to let them know that we were able to replace his Foley catheter today.  Strict return precautions and medical symptoms were discussed with the patient.  Patient is given the surgery to plan.  Patient is stable being discharged home in good condition.  Final Clinical Impression(s) / ED Diagnoses Final diagnoses:  Problem with Foley catheter, initial encounter (HCC)  Urinary tract infection associated with indwelling urethral catheter, initial encounter [facility])    Rx / DC Orders ED Discharge Orders          Ordered    cefUROXime (CEFTIN) 500 MG tablet  2 times daily with meals        12/15/21 2050              Achille Rich, PA-C 12/18/21 1036    Cathren Laine, MD 12/20/21  1725

## 2021-12-15 NOTE — ED Notes (Signed)
Waiting on medication from pharmacy to discharge

## 2021-12-18 DIAGNOSIS — N200 Calculus of kidney: Secondary | ICD-10-CM | POA: Diagnosis not present

## 2021-12-18 LAB — URINE CULTURE: Culture: >=100000 — AB

## 2021-12-19 ENCOUNTER — Emergency Department (HOSPITAL_COMMUNITY)
Admission: EM | Admit: 2021-12-19 | Discharge: 2021-12-19 | Disposition: A | Discharge status: Home / Self Care | Payer: Medicare PPO | Attending: Emergency Medicine | Admitting: Emergency Medicine

## 2021-12-19 ENCOUNTER — Other Ambulatory Visit: Payer: Self-pay

## 2021-12-19 ENCOUNTER — Encounter (HOSPITAL_COMMUNITY): Payer: Self-pay

## 2021-12-19 ENCOUNTER — Telehealth: Payer: Self-pay | Admitting: Emergency Medicine

## 2021-12-19 DIAGNOSIS — Z79899 Other long term (current) drug therapy: Secondary | ICD-10-CM | POA: Diagnosis not present

## 2021-12-19 DIAGNOSIS — I129 Hypertensive chronic kidney disease with stage 1 through stage 4 chronic kidney disease, or unspecified chronic kidney disease: Secondary | ICD-10-CM | POA: Diagnosis not present

## 2021-12-19 DIAGNOSIS — N189 Chronic kidney disease, unspecified: Secondary | ICD-10-CM | POA: Diagnosis not present

## 2021-12-19 DIAGNOSIS — T83511A Infection and inflammatory reaction due to indwelling urethral catheter, initial encounter: Secondary | ICD-10-CM | POA: Diagnosis not present

## 2021-12-19 DIAGNOSIS — N390 Urinary tract infection, site not specified: Secondary | ICD-10-CM | POA: Insufficient documentation

## 2021-12-19 DIAGNOSIS — T83511D Infection and inflammatory reaction due to indwelling urethral catheter, subsequent encounter: Secondary | ICD-10-CM | POA: Diagnosis not present

## 2021-12-19 MED ORDER — GENTAMICIN IN SALINE 1.6-0.9 MG/ML-% IV SOLN
80.0000 mg | Freq: Once | INTRAVENOUS | Status: AC
  Administered 2021-12-19: 80 mg via INTRAVENOUS
  Filled 2021-12-19: qty 50

## 2021-12-19 NOTE — ED Provider Notes (Signed)
[facility]-EMERGENCY DEPT Provider Note   CSN: [id number] Arrival date & time: 12/19/21  1246     History  Chief Complaint  Patient presents with   Urinary Tract Infection    Nicholas Mckenzie is a 76 y.o. male.  The history is provided by the patient and medical records. No language interpreter was used.  Urinary Tract Infection   76 year old male significant history of kidney CKD, hypertension, prior CVA presented to the ER for managements of recently diagnosed UTI.  Patient with history of right nephrectomy has an indwelling Foley catheter.  Patient is to have this surgery next week for bladder stone removal.  Patient was seen on 6/23 for Foley catheter change.  Patient was placed on cefuroxime twice daily for concerns of CAUTI.  Urine culture was sent, pharmacy team did review the urine culture and noted that patient UTI is resistant to cefuroxime and he is recommended to come to the ER to receive gentamicin antibiotic via IV.  Patient otherwise denies any fever, nausea, abdominal pain or any other concerning symptoms.  He is scheduled to have his bladder stone removal tomorrow.   Home Medications Prior to Admission medications   Medication Sig Start Date End Date Taking? Authorizing Provider  acetaminophen (TYLENOL) 325 MG tablet Take 2 tablets (650 mg total) by mouth every 6 (six) hours as needed for mild pain (or Fever >/= 101). Patient not taking: Reported on 11/06/2021 08/25/21   Rodolph Bong, MD  amLODipine (NORVASC) 5 MG tablet Take 1 tablet (5 mg total) by mouth in the morning and at bedtime. Patient taking differently: Take 10 mg by mouth once. Pt reports taking once in AM 06/08/21   Hongalgi, Maximino Greenland, MD  B Complex-C CAPS Take 1 capsule by mouth daily.    [provider]  cefUROXime (CEFTIN) 500 MG tablet Take 1 tablet (500 mg total) by mouth 2 (two) times daily with a meal for 10 days. 12/15/21 12/25/21  Achille Rich, PA-C  FLUoxetine (PROZAC) 40  MG capsule Take 40 mg by mouth daily.    [provider]  Ipratropium-Albuterol (COMBIVENT) 20-100 MCG/ACT AERS respimat Inhale 1 puff into the lungs every 6 (six) hours as needed for wheezing or shortness of breath. 08/25/21   Rodolph Bong, MD  lactobacillus (FLORANEX/LACTINEX) PACK Take 1 packet (1 g total) by mouth 2 (two) times daily. 04/13/21   Lanae Boast, MD  melatonin 3 MG TABS tablet Take 3 mg by mouth at bedtime.    [provider]  Multiple Vitamins-Minerals (MULTIVITAMIN WITH MINERALS) tablet Take 1 tablet by mouth daily.    [provider]  Omega-3 Fatty Acids (FISH OIL PO) Take 1 capsule by mouth daily.    [provider]      Allergies    Statins    Review of Systems   Review of Systems  All other systems reviewed and are negative.   Physical Exam Updated Vital Signs BP (!) 131/92 (BP Location: Left Arm)   Pulse 87   Temp 98.2 F (36.8 C) (Oral)   Resp 18   SpO2 94%  Physical Exam Vitals and nursing note reviewed.  Constitutional:      General: He is not in acute distress.    Appearance: He is well-developed.  HENT:     Head: Atraumatic.  Eyes:     Conjunctiva/sclera: Conjunctivae normal.  Cardiovascular:     Rate and Rhythm: Normal rate and regular rhythm.     Pulses:  Normal pulses.     Heart sounds: Normal heart sounds.  Pulmonary:     Effort: Pulmonary effort is normal.     Breath sounds: Normal breath sounds.  Abdominal:     Palpations: Abdomen is soft.  Musculoskeletal:     Cervical back: Neck supple.  Skin:    Findings: No rash.  Neurological:     Mental Status: He is alert.    ED Results / Procedures / Treatments   Labs (all labs ordered are listed, but only abnormal results are displayed) Labs Reviewed - No data to display  EKG None  Radiology No results found.  Procedures Procedures    Medications Ordered in ED Medications  gentamicin (GARAMYCIN) IVPB 80 mg (0 mg Intravenous Stopped  12/19/21 1624)    ED Course/ Medical Decision Making/ A&P                           Medical Decision Making Risk Prescription drug management.   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  Patient was seen several days prior in the ER for Foley catheter exchange.  At that time urinalysis was obtained showing signs of infection.  Urine culture grew infection resistance to cefuroxime that was previously prescribed.  Pharmacy and urology was involved in patient care and urology recommend gentamicin as treatment.  He is scheduled to have bladder stone removal procedure tomorrow.  Patient otherwise without any complaint.  He denies having abdominal pain fever chills nausea vomiting.  Patient is overall well-appearing, will request for gentamicin per pharmacy to dose.        Final Clinical Impression(s) / ED Diagnoses Final diagnoses:  Urinary tract infection associated with catheterization of urinary tract, unspecified indwelling urinary catheter type, subsequent encounter    Rx / DC Orders ED Discharge Orders     None         Fayrene Helper, PA-C 12/21/21 1520    Benjiman Core, MD 12/21/21 1616

## 2021-12-19 NOTE — ED Triage Notes (Signed)
Pt has been diagnosed with UTI and urine culture is resistant to all PO antibiotics. Pt was sent here for Gentamycin injection.

## 2021-12-20 DIAGNOSIS — Z960 Presence of urogenital implants: Secondary | ICD-10-CM | POA: Diagnosis not present

## 2021-12-20 DIAGNOSIS — N210 Calculus in bladder: Secondary | ICD-10-CM | POA: Diagnosis not present

## 2021-12-20 DIAGNOSIS — Z466 Encounter for fitting and adjustment of urinary device: Secondary | ICD-10-CM | POA: Diagnosis not present

## 2021-12-20 DIAGNOSIS — T8389XA Other specified complication of genitourinary prosthetic devices, implants and grafts, initial encounter: Secondary | ICD-10-CM | POA: Diagnosis not present

## 2021-12-20 DIAGNOSIS — N1330 Unspecified hydronephrosis: Secondary | ICD-10-CM | POA: Diagnosis not present

## 2021-12-20 DIAGNOSIS — Z9889 Other specified postprocedural states: Secondary | ICD-10-CM | POA: Diagnosis not present

## 2021-12-23 HISTORY — PX: BLADDER STONE REMOVAL: SHX568

## 2021-12-28 DIAGNOSIS — N200 Calculus of kidney: Secondary | ICD-10-CM | POA: Diagnosis not present

## 2021-12-28 DIAGNOSIS — Z906 Acquired absence of other parts of urinary tract: Secondary | ICD-10-CM | POA: Diagnosis not present

## 2021-12-28 DIAGNOSIS — N210 Calculus in bladder: Secondary | ICD-10-CM | POA: Diagnosis not present

## 2021-12-28 DIAGNOSIS — Z960 Presence of urogenital implants: Secondary | ICD-10-CM | POA: Diagnosis not present

## 2021-12-28 DIAGNOSIS — Z905 Acquired absence of kidney: Secondary | ICD-10-CM | POA: Diagnosis not present

## 2022-01-07 HISTORY — PX: NEPHRECTOMY: SHX65

## 2022-01-18 DIAGNOSIS — G4733 Obstructive sleep apnea (adult) (pediatric): Secondary | ICD-10-CM | POA: Diagnosis not present

## 2022-01-31 ENCOUNTER — Ambulatory Visit (INDEPENDENT_AMBULATORY_CARE_PROVIDER_SITE_OTHER): Payer: Medicare PPO | Admitting: Neurology

## 2022-01-31 ENCOUNTER — Encounter: Payer: Self-pay | Admitting: Neurology

## 2022-01-31 VITALS — BP 137/77 | HR 87 | Ht 68.0 in | Wt 215.4 lb

## 2022-01-31 DIAGNOSIS — I672 Cerebral atherosclerosis: Secondary | ICD-10-CM | POA: Diagnosis not present

## 2022-01-31 DIAGNOSIS — R260 Ataxic gait: Secondary | ICD-10-CM

## 2022-01-31 DIAGNOSIS — Z8673 Personal history of transient ischemic attack (TIA), and cerebral infarction without residual deficits: Secondary | ICD-10-CM | POA: Diagnosis not present

## 2022-01-31 MED ORDER — ASPIRIN 81 MG PO TBEC: 81.0000 mg | DELAYED_RELEASE_TABLET | Freq: Every day | ORAL | 12 refills | Status: DC

## 2022-01-31 NOTE — Patient Instructions (Addendum)
I had a long d/w patient about his recent stroke, risk for recurrent stroke/TIAs, personally independently reviewed imaging studies and stroke evaluation results and answered questions.Resume aspirin 81 mg daily  for secondary stroke prevention and maintain strict control of hypertension with blood pressure goal below 130/90, diabetes with hemoglobin A1c goal below 6.5% and lipids with LDL cholesterol goal below 70 mg/dL. I also advised the patient to eat a healthy diet with plenty of whole grains, cereals, fruits and vegetables, exercise regularly and maintain ideal body weight .check screening carotid ultrasound study, lipid profile and hemoglobin A1c and referral to outpatient physical Occupational Therapy for gait training.  Followup in the future with me in 6 months or call earlier if necessary   Stroke Prevention Some medical conditions and behaviors can lead to a higher chance of having a stroke. You can help prevent a stroke by eating healthy, exercising, not smoking, and managing any medical conditions you have. Stroke is a leading cause of functional impairment. Primary prevention is particularly important because a majority of strokes are first-time events. Stroke changes the lives of not only those who experience a stroke but also their family and other caregivers. How can this condition affect me? A stroke is a medical emergency and should be treated right away. A stroke can lead to brain damage and can sometimes be life-threatening. If a person gets medical treatment right away, there is a better chance of surviving and recovering from a stroke. What can increase my risk? The following medical conditions may increase your risk of a stroke: Cardiovascular disease. High blood pressure (hypertension). Diabetes. High cholesterol. Sickle cell disease. Blood clotting disorders (hypercoagulable state). Obesity. Sleep disorders (obstructive sleep apnea). Other risk factors include: Being  older than age 48. Having a history of blood clots, stroke, or mini-stroke (transient ischemic attack, TIA). Genetic factors, such as race, ethnicity, or a family history of stroke. Smoking cigarettes or using other tobacco products. Taking birth control pills, especially if you also use tobacco. Heavy use of alcohol or drugs, especially cocaine and methamphetamine. Physical inactivity. What actions can I take to prevent this? Manage your health conditions High cholesterol levels. Eating a healthy diet is important for preventing high cholesterol. If cholesterol cannot be managed through diet alone, you may need to take medicines. Take any prescribed medicines to control your cholesterol as told by your health care provider. Hypertension. To reduce your risk of stroke, try to keep your blood pressure below 130/80. Eating a healthy diet and exercising regularly are important for controlling blood pressure. If these steps are not enough to manage your blood pressure, you may need to take medicines. Take any prescribed medicines to control hypertension as told by your health care provider. Ask your health care provider if you should monitor your blood pressure at home. Have your blood pressure checked every year, even if your blood pressure is normal. Blood pressure increases with age and some medical conditions. Diabetes. Eating a healthy diet and exercising regularly are important parts of managing your blood sugar (glucose). If your blood sugar cannot be managed through diet and exercise, you may need to take medicines. Take any prescribed medicines to control your diabetes as told by your health care provider. Get evaluated for obstructive sleep apnea. Talk to your health care provider about getting a sleep evaluation if you snore a lot or have excessive sleepiness. Make sure that any other medical conditions you have, such as atrial fibrillation or atherosclerosis, are  managed. Nutrition Follow  instructions from your health care provider about what to eat or drink to help manage your health condition. These instructions may include: Reducing your daily calorie intake. Limiting how much salt (sodium) you use to 1,500 milligrams (mg) each day. Using only healthy fats for cooking, such as olive oil, canola oil, or sunflower oil. Eating healthy foods. You can do this by: Choosing foods that are high in fiber, such as whole grains, and fresh fruits and vegetables. Eating at least 5 servings of fruits and vegetables a day. Try to fill one-half of your plate with fruits and vegetables at each meal. Choosing lean protein foods, such as lean cuts of meat, poultry without skin, fish, tofu, beans, and nuts. Eating low-fat dairy products. Avoiding foods that are high in sodium. This can help lower blood pressure. Avoiding foods that have saturated fat, trans fat, and cholesterol. This can help prevent high cholesterol. Avoiding processed and prepared foods. Counting your daily carbohydrate intake.  Lifestyle If you drink alcohol: Limit how much you have to: 0-1 drink a day for women who are not pregnant. 0-2 drinks a day for men. Know how much alcohol is in your drink. In the [place] one drink equals one 12 oz bottle of beer (389m), one 5 oz glass of wine (1465m, or one 1 oz glass of hard liquor (4414m Do not use any products that contain nicotine or tobacco. These products include cigarettes, chewing tobacco, and vaping devices, such as e-cigarettes. If you need help quitting, ask your health care provider. Avoid secondhand smoke. Do not use drugs. Activity  Try to stay at a healthy weight. Get at least 30 minutes of exercise on most days, such as: Fast walking. Biking. Swimming. Medicines Take over-the-counter and prescription medicines only as told by your health care provider. Aspirin or blood thinners (antiplatelets or anticoagulants) may be recommended  to reduce your risk of forming blood clots that can lead to stroke. Avoid taking birth control pills. Talk to your health care provider about the risks of taking birth control pills if: You are over 35 52ars old. You smoke. You get very bad headaches. You have had a blood clot. Where to find more information American Stroke Association: www.strokeassociation.org Get help right away if: You or a loved one has any symptoms of a stroke. "BE FAST" is an easy way to remember the main warning signs of a stroke: B - Balance. Signs are dizziness, sudden trouble walking, or loss of balance. E - Eyes. Signs are trouble seeing or a sudden change in vision. F - Face. Signs are sudden weakness or numbness of the face, or the face or eyelid drooping on one side. A - Arms. Signs are weakness or numbness in an arm. This happens suddenly and usually on one side of the body. S - Speech. Signs are sudden trouble speaking, slurred speech, or trouble understanding what people say. T - Time. Time to call emergency services. Write down what time symptoms started. You or a loved one has other signs of a stroke, such as: A sudden, severe headache with no known cause. Nausea or vomiting. Seizure. These symptoms may represent a serious problem that is an emergency. Do not wait to see if the symptoms will go away. Get medical help right away. Call your local emergency services (911 in the [place] Do not drive yourself to the hospital. Summary You can help to prevent a stroke by eating healthy, exercising, not smoking, limiting alcohol intake, and managing any medical conditions  you may have. Do not use any products that contain nicotine or tobacco. These include cigarettes, chewing tobacco, and vaping devices, such as e-cigarettes. If you need help quitting, ask your health care provider. Remember "BE FAST" for warning signs of a stroke. Get help right away if you or a loved one has any of these signs. This information  is not intended to replace advice given to you by your health care provider. Make sure you discuss any questions you have with your health care provider. Document Revised: 01/11/2020 Document Reviewed: 01/11/2020 Elsevier Patient Education  [place].

## 2022-01-31 NOTE — Progress Notes (Signed)
[facility] [address] [place]. [place] [address] [phone]       OFFICE FOLLOW-UP NOTE  [name] Date of Birth:  22-Nov-1945 Medical Record Number:  [id number]   HPI: Nicholas Mckenzie is a 76 year old African-American male seen today for office follow-up visit following last visit with Janett Billow nurse practitioner on 06/21/2021.  He has  history of   CKD 3, OSA on CPAP, HTN, Kidney stones, OSA on CPAP who presented to the ED with malaise, lightheaded, confusion, dysuria, nausea and vomiting on 04/01/2021. Treated for a complicated UTI due to right ureteral stent obstruction. 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 This was not noted on CTH 2 days prior at the time of admission.  He was transferred to [facility] for further work-up and stroke evaluation.  Personally reviewed hospitalization pertinent progress notes, lab work and imaging.  Evaluated by Dr. Leonie Man. MRI showed multifocal acute ischemia within both cerebral hemispheres  R>L, left meddles cerebellar peduncle and inferior left cerebellum slightly older but still in the recent subacute phase.  Etiology likely secondary to large vessel disease with diffuse intracranial stenosis/occlusions (see below).  CTH showed evidence of chronic lacunar infarcts within the right basal ganglia, moderate chronic small vessel ischemic changes and mild generalized atrophy.  EF 60 to 65%.  LDL not calculated.  A1c 6.1.  Recommended DAPT for 3 months then aspirin alone. Therapy eval's rec'd CIR for generalized deconditioning and weakness. Due to insurance reasons, he was discharged to [place] on 04/14/2021. Readmitted 06/06/2021 after 3x falls at home hitting his head. Found to have trace posterior falcine SDH.  Evaluated by neurologist Dr. Marcelle Overlie who recommended holding aspirin and Plavix until f/u with OP neurology.  Neurosurgery eval no intervention required as small in  size and essentially asymptomatic. Repeat CTH no progression of SDH. Eval by nephrology for complicated UTI s/p nephrostomy exchange and abx course - planned f/u nephrology outpatient. Discharged home on 12/15.  Prior visit Janett Billow , NP 06/21/2021 : 06/21/2021, Nicholas Mckenzie is being seen for initial hospital stroke follow-up accompanied by his spouse.    Continued left-sided weakness - denies any improvement but her wife, has been gradually improving. Use of RW for gait difficulties - he questions transitioning to a cane.  Occasional mild slurred speech but gradually improving.  Some cognitive difficulties -gradually improving. C/o visual spatial deficit and left hemianopia - recently seen by optometrist who recommended evaluation by neuro-ophthalmologist for possible benefit of present lenses. 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  Recently seen by audiologist who made adjustments to hearing aides. C/o left lower face and tongue numbness. Working with [facility] PT/OT/SLP.  Denies new stroke/TIA symptoms   Aspirin/plavix d/c'd as noted above but per wife, recently seen urologist Dr. Barbee Cough at [facility] urology who advised to hold aspirin and Plavix until kidney surgery which will likely happen in March. Unable to verify this as unable to view notes via epic. Was not started on statin at prior d/c due to extensive history of statin intolerance.  Establish care with new PCP Dr. Doristine Bosworth with initial visit 3 days ago - per wife, completed lab work - unable to view via epic. Will request labs sent to office.  Blood pressure today 159/89 - HH therapies routinely monitoring which has been stable. No additional EtOH use since discharge -  prior, drinking multiple beers and liquor drinks per day. Wife has been trying to encourage dietary changes and weight loss.    Update 01/30/2022 : He returns for follow-up today after last visit 8 months ago.   He is accompanied by his wife.  He states he is doing well and has had no recurrent stroke or TIA symptoms.  He states he is made a good recovery from his stroke.  Therapy has persistent decreased hearing on the left and some numbness of the left lesion as well as some gait and balance difficulties.  He is had no recent falls.  Fell 4 weeks ago while he was playing outside.  He had recent surgery with right kidney removal as well as bladder stone removal a few weeks ago which has been going well.  He is currently off antiplatelet agents because of his surgeries.  States his blood pressure is well-controlled at home and today it is borderline in office at 137/87.  He uses a wheeled walker and still unsteady particularly when he is turning.  Can walk short distances without it.  Patient has finished home therapies and feels he may benefit from outpatient therapies.  Has no other complaints today. ROS:   14 system review of systems is positive for vision difficulties, speech difficulties, imbalance, fall and all other systems negative  PMH:  Past Medical History:  Diagnosis Date   CKD (chronic kidney disease) stage 3, GFR 30-59 ml/min (HCC)    Hypertension    Kidney stones    Sleep apnea     Social History:  Social History   Socioeconomic History   Marital status: Married    Spouse name: Not on file   Number of children: Not on file   Years of education: Not on file   Highest education level: Not on file  Occupational History   Not on file  Tobacco Use   Smoking status: Never   Smokeless tobacco: Never  Substance and Sexual Activity   Alcohol use: Yes    Comment: Socially; twice a week   Drug use: Not on file   Sexual activity: Not on file  Other Topics Concern   Not on file  Social History Narrative   Not on file   Social Determinants of Health   Financial Resource Strain: Not on file  Food Insecurity: Not on file  Transportation Needs: Not on file  Physical Activity: Not on  file  Stress: Not on file  Social Connections: Not on file  Intimate Partner Violence: Not on file    Medications:   Current Outpatient Medications on File Prior to Visit  Medication Sig Dispense Refill   acetaminophen (TYLENOL) 325 MG tablet Take 2 tablets (650 mg total) by mouth every 6 (six) hours as needed for mild pain (or Fever >/= 101).     amLODipine (NORVASC) 5 MG tablet Take 1 tablet (5 mg total) by mouth in the morning and at bedtime. (Patient taking differently: Take 10 mg by mouth once. Pt reports taking once in AM)     B Complex-C CAPS Take 1 capsule by mouth daily.     FLUoxetine (PROZAC) 40 MG capsule Take 40 mg by mouth daily.     lactobacillus (FLORANEX/LACTINEX) PACK Take 1 packet (1 g total) by mouth 2 (two) times daily.     melatonin 3 MG TABS tablet Take 3 mg by mouth at bedtime.     Multiple Vitamins-Minerals (MULTIVITAMIN WITH MINERALS) tablet Take 1 tablet by mouth  daily.     Omega-3 Fatty Acids (FISH OIL PO) Take 1 capsule by mouth daily.     No current facility-administered medications on file prior to visit.    Allergies:   Allergies  Allergen Reactions   Statins Other (See Comments)    Pt reports severe weakness and mobility issues with his med, as well as diarrhea   Tape Rash    Physical Exam General: well developed, well nourished, seated, in no evident distress Head: head normocephalic and atraumatic.  Neck: supple with no carotid or supraclavicular bruits Cardiovascular: regular rate and rhythm, no murmurs Musculoskeletal: no deformity Skin:  no rash/petichiae Vascular:  Normal pulses all extremities Vitals:   01/31/22 1234  BP: 137/77  Pulse: 87   Neurologic Exam Mental Status: Awake and fully alert. Oriented to place and time. Recent and remote memory intact. Attention span, concentration and fund of knowledge appropriate. Mood and affect appropriate.  Intact recall 3/3.  Clock drawing 3/4.  Able to name 12 animals which can walk on 4  legs. Cranial Nerves: Fundoscopic exam reveals sharp disc margins. Pupils equal, briskly reactive to light. Extraocular movements full without nystagmus. Visual fields partial left homonymous hemianopsia to confrontation. Hearing intact. Facial sensation intact.  Left lower facial , tongue, palate moves normally and symmetrically.  Motor: Normal bulk and tone. Normal strength in all tested extremity muscles.  Diminished fine finger movements on the left.  Trace left grip weakness.  Orbits right over left upper extremity. Sensory.: intact to touch ,pinprick .position and vibratory sensation.  Coordination: Rapid alternating movements normal in all extremities. Finger-to-nose and heel-to-shin performed accurately bilaterally. Gait and Station: Arises from chair without difficulty. Stance is normal. Gait demonstrates normal stride length and balance .  Uses a wheeled walker.  Slightly ataxic when he makes a turn.  Tandem walking not tested..  Reflexes: 1+ and symmetric. Toes downgoing.   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  Vascular risk factors intracranial atherosclerosis, hypertension, hyperlipidemia, obstructive sleep.     PLAN: I had a long d/w patient about his recent stroke, risk for recurrent stroke/TIAs, personally independently reviewed imaging studies and stroke evaluation results and answered questions.Resume aspirin 81 mg daily  for secondary stroke prevention and maintain strict control of hypertension with blood pressure goal below 130/90, diabetes with hemoglobin A1c goal below 6.5% and lipids with LDL cholesterol goal below 70 mg/dL. I also advised the patient to eat a healthy diet with plenty of whole grains, cereals, fruits and vegetables, exercise regularly and maintain  ideal body weight .check screening carotid ultrasound study, lipid profile and hemoglobin A1c and referral to outpatient physical and Occupational Therapy for gait training.  He was advised not to drive till his peripheral vision comes back.  Followup in the future with me in 6 months or call earlier if necessary Greater than 50% of time during this 35 minute visit was spent on counseling,explanation of diagnosis, planning of further management, discussion with patient and family and coordination of care Delia Heady, MD Note: This document was prepared with digital dictation and possible smart phrase technology. Any transcriptional errors that result from this process are unintentional

## 2022-02-01 LAB — LIPID PANEL
Chol/HDL Ratio: 6.3 ratio — ABNORMAL HIGH (ref 0.0–5.0)
Cholesterol, Total: 238 mg/dL — ABNORMAL HIGH (ref 100–199)
HDL: 38 mg/dL — ABNORMAL LOW (ref >39)
LDL Chol Calc (NIH): 151 mg/dL — ABNORMAL HIGH (ref 0–99)
Triglycerides: 267 mg/dL — ABNORMAL HIGH (ref 0–149)
VLDL Cholesterol Cal: 49 mg/dL — ABNORMAL HIGH (ref 5–40)

## 2022-02-01 LAB — HEMOGLOBIN A1C
Est. average glucose Bld gHb Est-mCnc: 117 mg/dL
Hgb A1c MFr Bld: 5.7 % — ABNORMAL HIGH (ref 4.8–5.6)

## 2022-02-02 ENCOUNTER — Ambulatory Visit: Payer: Medicare PPO | Attending: Internal Medicine

## 2022-02-02 DIAGNOSIS — R2689 Other abnormalities of gait and mobility: Secondary | ICD-10-CM | POA: Diagnosis not present

## 2022-02-02 DIAGNOSIS — R278 Other lack of coordination: Secondary | ICD-10-CM | POA: Insufficient documentation

## 2022-02-02 DIAGNOSIS — R260 Ataxic gait: Secondary | ICD-10-CM | POA: Diagnosis not present

## 2022-02-02 DIAGNOSIS — M6281 Muscle weakness (generalized): Secondary | ICD-10-CM | POA: Diagnosis not present

## 2022-02-02 DIAGNOSIS — R2681 Unsteadiness on feet: Secondary | ICD-10-CM | POA: Insufficient documentation

## 2022-02-02 NOTE — Therapy (Signed)
OUTPATIENT PHYSICAL THERAPY NEURO EVALUATION   Patient Name: Nicholas Mckenzie MRN: [id number] DOB:[birth year], 76 y.o., male Today's Date: 02/02/2022   PCP: Delano Metz, MD REFERRING PROVIDER: Micki Riley, MD    PT End of Session - 02/02/22 0756     Visit Number 1    Number of Visits 13    Date for PT Re-Evaluation 03/16/22    Authorization Type [employer]    PT Start Time 0800    PT Stop Time 0845    PT Time Calculation (min) 45 min    Equipment Utilized During Treatment Gait belt    Activity Tolerance Patient tolerated treatment well    Behavior During Therapy WFL for tasks assessed/performed;Impulsive             Past Medical History:  Diagnosis Date   CKD (chronic kidney disease) stage 3, GFR 30-59 ml/min (HCC)    Hypertension    Kidney stones    Sleep apnea    Past Surgical History:  Procedure Laterality Date   BLADDER STONE REMOVAL  12/2021   CATARACT EXTRACTION     IR NEPHROSTOMY EXCHANGE RIGHT  06/07/2021   IR NEPHROSTOMY EXCHANGE RIGHT  08/22/2021   IR NEPHROSTOMY PLACEMENT RIGHT  04/07/2021   IR PATIENT EVAL TECH 0-60 MINS  06/02/2021   kidney stent     MANDIBLE FRACTURE SURGERY     NEPHRECTOMY Right 01/07/2022   Patient Active Problem List   Diagnosis Date Noted   Hypernatremia 08/23/2021   Malnutrition of moderate degree 08/17/2021   Acute respiratory failure with hypoxia (HCC) 08/16/2021   Severe sepsis (HCC) 08/16/2021   Sore throat 08/16/2021   Hx of subdural hematoma 08/16/2021   Nephrolithiasis 08/16/2021   Stage 3a chronic kidney disease (CKD) (HCC) 08/16/2021   Obesity (BMI 30-39.9) 08/16/2021   OSA (obstructive sleep apnea) 08/16/2021   Aspiration pneumonia (HCC) 08/15/2021   Snoring 08/08/2021   Frequent urination 06/07/2021   Acute lower UTI 06/07/2021   Ascending aortic aneurysm (HCC) 06/07/2021   Atrophy of right kidney 06/07/2021   Anemia 06/07/2021   Obesity (BMI 30.0-34.9) 06/07/2021   Subdural hematoma (HCC)  06/06/2021   COVID-19 virus infection 06/06/2021   Hypokalemia 06/06/2021   H/O ischemic right PCA stroke 06/06/2021   History of cardioembolic cerebrovascular accident (CVA) 06/06/2021   Right-sided cerebrovascular accident (CVA) (HCC) 04/14/2021   Nonfunctioning kidney 04/14/2021   Goals of care, counseling/discussion 04/03/2021   Ureteral stent occlusion (HCC) 04/02/2021   CKD (chronic kidney disease) stage 3a 03/11/2021   Major depressive disorder, recurrent episode, in partial remission (HCC) 03/11/2021   Hypertension    Sleep apnea    GAD (generalized anxiety disorder) 07/20/2019    ONSET DATE: 04/01/2021   REFERRING DIAG: R26.0 (ICD-10-CM) - Ataxic gait   THERAPY DIAG:  Other abnormalities of gait and mobility  Unsteadiness on feet  Muscle weakness (generalized)  Other lack of coordination  Rationale for Evaluation and Treatment Rehabilitation  SUBJECTIVE:  SUBJECTIVE STATEMENT: Patient reports doing well- had "2 major surgeries" since last here ~2 months ago. Didn't take rx this AM due to early appt time. Walks without walker in the house at times, but has had multiple falls when not using it. Wife providing some information. Wife states that patient has asked to get on a ladder.   Pt accompanied by: significant other  PERTINENT HISTORY: acute/subacute R PCA stroke kidney CKD, hypertension, prior CVA   PAIN:  Are you having pain? No  VITALS:  BP: 176/91  BP after 10' rest: 178/91   BP after 3 components of FGA: 172/103   BP after 2' rest: 177/96   PRECAUTIONS: Fall and Other: check BP  WEIGHT BEARING RESTRICTIONS No  FALLS: Has patient fallen in last 6 months? Yes. Number of falls 2; 4 weeks ago fell forward and hit his head on concrete (didn't go to the MD or ER);  yesterday R knee gave out (not using walker)  has rolled out of bed multiple times  LIVING ENVIRONMENT: Lives with: lives with their spouse Lives in: House/apartment Stairs: Yes: External: 5 steps; can reach both Has following equipment at home: Quad cane large base, Environmental consultant - 4 wheeled, Shower bench, and Grab bars  PLOF: Requires assistive device for independence  PATIENT GOALS "to get my legs stronger"  OBJECTIVE:   DIAGNOSTIC FINDINGS: acute/subacute R PCA stroke   COGNITION: Overall cognitive status: History of cognitive impairments - at baseline   SENSATION: WFL  COORDINATION: Ataxic B LE  POSTURE: No Significant postural limitations   BED MOBILITY:  Has fallen out of bed multiple times  TRANSFERS: Assistive device utilized: Environmental consultant - 4 wheeled  Sit to stand: SBA Stand to sit: SBA Chair to chair: SBA    GAIT: Gait pattern: step through pattern, decreased arm swing- Right, decreased arm swing- Left, decreased step length- Right, decreased step length- Left, decreased hip/knee flexion- Right, decreased hip/knee flexion- Left, Right foot flat, Left foot flat, shuffling, trunk flexed, wide BOS, poor foot clearance- Right, and poor foot clearance- Left Distance walked: clinic Assistive device utilized: Walker - 4 wheeled Level of assistance: CGA Comments: needs frequent cuing to safely use rollator, especially with turns   FUNCTIONAL TESTs:   [facility] PT Assessment - 02/02/22 0001       Standardized Balance Assessment   Standardized Balance Assessment Timed Up and Go Test      Timed Up and Go Test   Normal TUG (seconds) 73   with consistent cues for following directions of test     Functional Gait  Assessment   Gait assessed  Yes    Gait Level Surface Walks 20 ft in less than 7 sec but greater than 5.5 sec, uses assistive device, slower speed, mild gait deviations, or deviates 6-10 in outside of the 12 in walkway width.    Change in Gait Speed Able to change  speed, demonstrates mild gait deviations, deviates 6-10 in outside of the 12 in walkway width, or no gait deviations, unable to achieve a major change in velocity, or uses a change in velocity, or uses an assistive device.    Gait with Horizontal Head Turns Performs head turns with moderate changes in gait velocity, slows down, deviates 10-15 in outside 12 in walkway width but recovers, can continue to walk.    Gait with Vertical Head Turns Performs task with moderate change in gait velocity, slows down, deviates 10-15 in outside 12 in walkway width but recovers, can continue to  walk.    Gait and Pivot Turn Turns slowly, requires verbal cueing, or requires several small steps to catch balance following turn and stop    Step Over Obstacle Cannot perform without assistance.    Gait with Narrow Base of Support Ambulates less than 4 steps heel to toe or cannot perform without assistance.    Gait with Eyes Closed Cannot walk 20 ft without assistance, severe gait deviations or imbalance, deviates greater than 15 in outside 12 in walkway width or will not attempt task.    Ambulating Backwards Cannot walk 20 ft without assistance, severe gait deviations or imbalance, deviates greater than 15 in outside 12 in walkway width or will not attempt task.    Steps Two feet to a stair, must use rail.    Total Score 8              TODAY'S TREATMENT:  N/a eval    PATIENT EDUCATION: Education details: PT POC, OM results, importance of taking rx as prescribed, therapy-appropriate BP parameters, remaining inside walker for all turns, safe use of AD, home safety/fall precautions Person educated: Patient and Spouse Education method: Explanation Education comprehension: verbalized understanding and needs further education   HOME EXERCISE PROGRAM: To be provided    GOALS: Goals reviewed with patient? Yes  SHORT TERM GOALS: Target date: 02/23/2022  Pt will be independent with initial HEP for improved  functional strength and balance  Baseline: to be provided  Goal status: INITIAL  2.  Pt will improve TUG to </= 60 secs to demonstrated reduced fall risk  Baseline: 73 Goal status: INITIAL  3.  Pt will improve FGA to >/= 13/30 to demonstrate improved balance and reduced fall risk  Baseline: 8/30 Goal status: INITIAL  4.  Patients gait speed to be assessed and LTG to be added as appropriate  Baseline: to be assessed Goal status: INITIAL   LONG TERM GOALS: Target date: 03/16/2022  Pt will be independent with final HEP for improved functional strength and balance  Baseline: to be provided Goal status: INITIAL  2.  Pt will improve TUG to </= 45 secs to demonstrated reduced fall risk  Baseline: 73s  Goal status: INITIAL  3.  Pt will improve FGA to >/= 16/30 to demonstrate improved balance and reduced fall risk  Baseline: 8/30 Goal status: INITIAL  ASSESSMENT:  CLINICAL IMPRESSION: Patient is a 76 y.o. male who was seen today for physical therapy evaluation and treatment for impaired functional strength and balance due to previous CVA. Patient demonstrates increased fall risk as noted by score of 8/30 on  Functional Gait Assessment.   <22/30 = predictive of falls, <20/30 = fall in 6 months, <18/30 = predictive of falls in PD MCID: 5 points stroke population, 4 points geriatric population (ANPTA Core Set of Outcome Measures for Adults with Neurologic Conditions, 2018). Patient completed the Timed Up and Go test (TUG) in 73 seconds.  Geriatrics: need for further assessment of fall risk: ? 12 sec; Recurrent falls: > 15 sec; Vestibular Disorders fall risk: > 15 sec; Parkinson's Disease fall risk: > 16 sec ([email or web address], 2023). Patient with very poor safety awareness requiring multiple and frequent verbal cues to adhere to instructions. He also has L homonymous hemianopsia complicating his ability to remain safe with all functional mobility. Patient will benefit from skilled PT in order  to address the above mentioned deficits.   OBJECTIVE IMPAIRMENTS Abnormal gait, decreased activity tolerance, decreased balance, decreased cognition, decreased coordination, decreased endurance, decreased knowledge  of condition, decreased knowledge of use of DME, decreased safety awareness, impaired perceived functional ability, and impaired vision/preception.   ACTIVITY LIMITATIONS carrying, lifting, bending, standing, squatting, stairs, transfers, bed mobility, and locomotion level  PARTICIPATION LIMITATIONS: cleaning, medication management, interpersonal relationship, driving, shopping, and community activity  PERSONAL FACTORS Age, Behavior pattern, Past/current experiences, Time since onset of injury/illness/exacerbation, and 3+ comorbidities: HTN, CKD, homonymous hemianopsia, cog deficits  are also affecting patient's functional outcome.   REHAB POTENTIAL: Fair time since onset  CLINICAL DECISION MAKING: Evolving/moderate complexity  EVALUATION COMPLEXITY: Moderate  PLAN: PT FREQUENCY: 2x/week  PT DURATION: 6 weeks  PLANNED INTERVENTIONS: Therapeutic exercises, Therapeutic activity, Neuromuscular re-education, Balance training, Gait training, Patient/Family education, Self Care, Joint mobilization, Stair training, Vestibular training, Visual/preceptual remediation/compensation, Orthotic/Fit training, DME instructions, Aquatic Therapy, Cognitive remediation, Wheelchair mobility training, Manual therapy, and Re-evaluation  PLAN FOR NEXT SESSION: assess gait speed, provide HEP, household distance gait with rollator and turns   Debbora Dus, PT Debbora Dus, PT, DPT, CBIS  02/02/2022, 8:48 AM

## 2022-02-03 ENCOUNTER — Other Ambulatory Visit: Payer: Self-pay | Admitting: Neurology

## 2022-02-03 DIAGNOSIS — R4189 Other symptoms and signs involving cognitive functions and awareness: Secondary | ICD-10-CM

## 2022-02-03 DIAGNOSIS — R29898 Other symptoms and signs involving the musculoskeletal system: Secondary | ICD-10-CM

## 2022-02-04 ENCOUNTER — Other Ambulatory Visit: Payer: Self-pay | Admitting: Neurology

## 2022-02-04 MED ORDER — REPATHA SURECLICK 140 MG/ML ~~LOC~~ SOAJ: 140.0000 mg | SUBCUTANEOUS | 3 refills | Status: DC

## 2022-02-04 NOTE — Progress Notes (Signed)
Kindly inform the patient that his cholesterol was profile is quite unsatisfactory and given his previous history of strokes and history of statin intolerance I am going to start him on injection Repatha if insurance will approve.  His screening test for diabetes was satisfactory

## 2022-02-05 ENCOUNTER — Ambulatory Visit: Payer: Medicare PPO

## 2022-02-05 ENCOUNTER — Telehealth: Payer: Self-pay | Admitting: *Deleted

## 2022-02-05 DIAGNOSIS — R278 Other lack of coordination: Secondary | ICD-10-CM | POA: Diagnosis not present

## 2022-02-05 DIAGNOSIS — M6281 Muscle weakness (generalized): Secondary | ICD-10-CM

## 2022-02-05 DIAGNOSIS — R2681 Unsteadiness on feet: Secondary | ICD-10-CM

## 2022-02-05 DIAGNOSIS — R2689 Other abnormalities of gait and mobility: Secondary | ICD-10-CM

## 2022-02-05 DIAGNOSIS — R260 Ataxic gait: Secondary | ICD-10-CM | POA: Diagnosis not present

## 2022-02-05 NOTE — Therapy (Signed)
OUTPATIENT PHYSICAL THERAPY TREATMENT NOTE   Patient Name: Nicholas Mckenzie MRN: [id number] DOB:05/13/1946, 76 y.o., male Today's Date: 02/05/2022  PCP: Early Osmond, MD REFERRING PROVIDER: [name], MD   END OF SESSION:   PT End of Session - 02/05/22 0815     Visit Number 2    Number of Visits 13    Date for PT Re-Evaluation 03/16/22    Authorization Type [employer]    PT Start Time [phone]   patient late   PT Stop Time 0850    PT Time Calculation (min) 39 min    Equipment Utilized During Treatment Gait belt    Activity Tolerance Patient tolerated treatment well    Behavior During Therapy [facility] for tasks assessed/performed;Impulsive             Past Medical History:  Diagnosis Date   CKD (chronic kidney disease) stage 3, GFR 30-59 ml/min (HCC)    Hypertension    Kidney stones    Sleep apnea    Past Surgical History:  Procedure Laterality Date   BLADDER STONE REMOVAL  12/2021   CATARACT EXTRACTION     IR NEPHROSTOMY EXCHANGE RIGHT  06/07/2021   IR NEPHROSTOMY EXCHANGE RIGHT  08/22/2021   IR NEPHROSTOMY PLACEMENT RIGHT  04/07/2021   IR PATIENT EVAL TECH 0-60 MINS  06/02/2021   kidney stent     MANDIBLE FRACTURE SURGERY     NEPHRECTOMY Right 01/07/2022   Patient Active Problem List   Diagnosis Date Noted   Hypernatremia 08/23/2021   Malnutrition of moderate degree 08/17/2021   Acute respiratory failure with hypoxia (HCC) 08/16/2021   Severe sepsis ([place]) 08/16/2021   Sore throat 08/16/2021   Hx of subdural hematoma 08/16/2021   Nephrolithiasis 08/16/2021   Stage 3a chronic kidney disease (CKD) ([place]) 08/16/2021   Obesity (BMI 30-39.9) 08/16/2021   OSA (obstructive sleep apnea) 08/16/2021   Aspiration pneumonia ([place]) 08/15/2021   Snoring 08/08/2021   Frequent urination 06/07/2021   Acute lower UTI 06/07/2021   Ascending aortic aneurysm ([place]) 06/07/2021   Atrophy of right kidney 06/07/2021   Anemia 06/07/2021   Obesity (BMI 30.0-34.9) 06/07/2021    Subdural hematoma ([place]) 06/06/2021   COVID-19 virus infection 06/06/2021   Hypokalemia 06/06/2021   H/O ischemic right PCA stroke 06/06/2021   History of cardioembolic cerebrovascular accident (CVA) 06/06/2021   Right-sided cerebrovascular accident (CVA) ([place]) 04/14/2021   Nonfunctioning kidney 04/14/2021   Goals of care, counseling/discussion 04/03/2021   Ureteral stent occlusion ([place]) 04/02/2021   CKD (chronic kidney disease) stage 3a 03/11/2021   Major depressive disorder, recurrent episode, in partial remission ([place]) 03/11/2021   Hypertension    Sleep apnea    GAD (generalized anxiety disorder) 07/20/2019    REFERRING DIAG: R26.0 (ICD-10-CM) - Ataxic gait  THERAPY DIAG:  Other abnormalities of gait and mobility  Unsteadiness on feet  Muscle weakness (generalized)  Other lack of coordination  Rationale for Evaluation and Treatment Rehabilitation  PERTINENT HISTORY: acute/subacute R PCA stroke kidney CKD, hypertension, prior CVA   PRECAUTIONS: fall- check BP  SUBJECTIVE: Patient reports doing well- took rx this AM- no falls.  PAIN:  Are you having pain? No VITALS:   BP: 168/99   BP after 2' rest: 154/89  TODAY'S TREATMENT:  GAIT:   [facility] PT Assessment - 02/05/22 0001       Standardized Balance Assessment   Standardized Balance Assessment 10 meter walk test    10 Meter Walk .46ms   with rollator  BP afterward: 173/92   -75f x4 weaving thru cones with rollator + spv     BP afterward: 157/88    -household distance pacing with proper sequence for using rollator    -HEP, see below      PATIENT EDUCATION: Education details: OM results, safe building alternatives, safe use of rollator, HEP Person educated: Patient and Spouse Education method: Explanation Education comprehension: verbalized understanding and needs further education     HOME EXERCISE PROGRAM: Access Code: [email or web address]: [email or web address] Date:  02/05/2022 Prepared by: JEstevan Ryder Exercises - Sit to Stand with Counter Support  - 1 x daily - 7 x weekly - 3 sets - 10 reps - Standing March with Counter Support  - 1 x daily - 7 x weekly - 3 sets - 10 reps - Side Stepping with Counter Support  - 1 x daily - 7 x weekly - 3 sets - 10 reps       GOALS: Goals reviewed with patient? Yes   SHORT TERM GOALS: Target date: 02/23/2022   Pt will be independent with initial HEP for improved functional strength and balance  Baseline: to be provided  Goal status: INITIAL   2.  Pt will improve TUG to </= 60 secs to demonstrated reduced fall risk   Baseline: 73 Goal status: INITIAL   3.  Pt will improve FGA to >/= 13/30 to demonstrate improved balance and reduced fall risk   Baseline: 8/30 Goal status: INITIAL   4.  Patients gait speed to be assessed and LTG to be added as appropriate  Baseline: .462m Goal status: MET     LONG TERM GOALS: Target date: 03/16/2022   Pt will be independent with final HEP for improved functional strength and balance  Baseline: to be provided Goal status: INITIAL   2.  Pt will improve TUG to </= 45 secs to demonstrated reduced fall risk   Baseline: 73s  Goal status: INITIAL   3.  Pt will improve FGA to >/= 16/30 to demonstrate improved balance and reduced fall risk   Baseline: 8/30 Goal status: INITIAL  4. Pt will improve gait speed to >/= .68m11mto demonstrate improved community ambulation  Baseline: .28m84mGoal status: NEW   ASSESSMENT:   CLINICAL IMPRESSION: Patient seen for skilled PT session with emphasis on HEP establishment and gait retraining with rollator. Patients BP improving during session since he reportedly took his rx at ~8am. Patient perseverating on [place]. PT attempting to offer patient and SO safe building alternatives due to patients poor safety awareness and problem solving ([place]). He continues to require relatively  constant verbal cues to safely manage rollator. 10 Meter Walk Test: Patient instructed to walk 10 meters (32.8 ft) as quickly and as safely as possible at their normal speed x2 and at a fast speed x2. Time measured from 2 meter mark to 8 meter mark to accommodate ramp-up and ramp-down.  Normal speed: .28m/94mut off scores: <0.4 m/s = household Ambulator, 0.4-0.8 m/s = limited community Ambulator, >0.8 m/s = community Ambulator, >1.2 m/s = crossing a street, <1.0 = increased fall risk MCID 0.05 m/s (small), 0.13 m/s (moderate), 0.06 m/s (significant)  (ANPTA Core Set of Outcome Measures for Adults with Neurologic Conditions, 2018). Continue POC.     OBJECTIVE IMPAIRMENTS Abnormal gait, decreased activity tolerance, decreased balance, decreased cognition, decreased coordination, decreased endurance, decreased knowledge of condition, decreased knowledge of use of DME, decreased safety awareness,  impaired perceived functional ability, and impaired vision/preception.    ACTIVITY LIMITATIONS carrying, lifting, bending, standing, squatting, stairs, transfers, bed mobility, and locomotion level   PARTICIPATION LIMITATIONS: cleaning, medication management, interpersonal relationship, driving, shopping, and community activity   PERSONAL FACTORS Age, Behavior pattern, Past/current experiences, Time since onset of injury/illness/exacerbation, and 3+ comorbidities: HTN, CKD, homonymous hemianopsia, cog deficits  are also affecting patient's functional outcome.    REHAB POTENTIAL: Fair time since onset   CLINICAL DECISION MAKING: Evolving/moderate complexity   EVALUATION COMPLEXITY: Moderate   PLAN: PT FREQUENCY: 2x/week   PT DURATION: 6 weeks   PLANNED INTERVENTIONS: Therapeutic exercises, Therapeutic activity, Neuromuscular re-education, Balance training, Gait training, Patient/Family education, Self Care, Joint mobilization, Stair training, Vestibular training, Visual/preceptual  remediation/compensation, Orthotic/Fit training, DME instructions, Aquatic Therapy, Cognitive remediation, Wheelchair mobility training, Manual therapy, and Re-evaluation   PLAN FOR NEXT SESSION: household distance gait with rollator and turns, step length, foot clearance balance strategies   Debbora Dus, PT Debbora Dus, PT, DPT, CBIS  02/05/2022, 8:52 AM

## 2022-02-05 NOTE — Telephone Encounter (Signed)
-----   Message from Micki Riley, MD sent at 02/04/2022  4:14 PM EDT ----- Joneen Roach inform the patient that his cholesterol was profile is quite unsatisfactory and given his previous history of strokes and history of statin intolerance I am going to start him on injection Repatha if insurance will approve.  His screening test for diabetes was satisfactory

## 2022-02-05 NOTE — Telephone Encounter (Signed)
I spoke with the patient and informed him of the results. He is willing to try the medication to lower his cholesterol. I advised him to call back with any further questions or concerns. He verbalized understanding and expressed appreciation for the call. All questions answered.

## 2022-02-07 ENCOUNTER — Ambulatory Visit: Payer: Medicare PPO

## 2022-02-07 DIAGNOSIS — R260 Ataxic gait: Secondary | ICD-10-CM | POA: Diagnosis not present

## 2022-02-07 DIAGNOSIS — R278 Other lack of coordination: Secondary | ICD-10-CM

## 2022-02-07 DIAGNOSIS — R2681 Unsteadiness on feet: Secondary | ICD-10-CM | POA: Diagnosis not present

## 2022-02-07 DIAGNOSIS — R2689 Other abnormalities of gait and mobility: Secondary | ICD-10-CM | POA: Diagnosis not present

## 2022-02-07 DIAGNOSIS — M6281 Muscle weakness (generalized): Secondary | ICD-10-CM

## 2022-02-07 NOTE — Therapy (Signed)
OUTPATIENT PHYSICAL THERAPY TREATMENT NOTE   Patient Name: Nicholas Mckenzie MRN: [id number] DOB:05-15-1946, 76 y.o., male Today's Date: 02/07/2022  PCP: Early Osmond, MD REFERRING PROVIDER: [name], MD   END OF SESSION:   PT End of Session - 02/07/22 0812     Visit Number 3    Number of Visits 13    Date for PT Re-Evaluation 03/16/22    Authorization Type [employer]    PT Start Time 0809   patient late   PT Stop Time 0847    PT Time Calculation (min) 38 min    Activity Tolerance Patient tolerated treatment well    Behavior During Therapy [facility] for tasks assessed/performed;Impulsive             Past Medical History:  Diagnosis Date   CKD (chronic kidney disease) stage 3, GFR 30-59 ml/min (HCC)    Hypertension    Kidney stones    Sleep apnea    Past Surgical History:  Procedure Laterality Date   BLADDER STONE REMOVAL  12/2021   CATARACT EXTRACTION     IR NEPHROSTOMY EXCHANGE RIGHT  06/07/2021   IR NEPHROSTOMY EXCHANGE RIGHT  08/22/2021   IR NEPHROSTOMY PLACEMENT RIGHT  04/07/2021   IR PATIENT EVAL TECH 0-60 MINS  06/02/2021   kidney stent     MANDIBLE FRACTURE SURGERY     NEPHRECTOMY Right 01/07/2022   Patient Active Problem List   Diagnosis Date Noted   Hypernatremia 08/23/2021   Malnutrition of moderate degree 08/17/2021   Acute respiratory failure with hypoxia (HCC) 08/16/2021   Severe sepsis ([place]) 08/16/2021   Sore throat 08/16/2021   Hx of subdural hematoma 08/16/2021   Nephrolithiasis 08/16/2021   Stage 3a chronic kidney disease (CKD) ([place]) 08/16/2021   Obesity (BMI 30-39.9) 08/16/2021   OSA (obstructive sleep apnea) 08/16/2021   Aspiration pneumonia ([place]) 08/15/2021   Snoring 08/08/2021   Frequent urination 06/07/2021   Acute lower UTI 06/07/2021   Ascending aortic aneurysm ([place]) 06/07/2021   Atrophy of right kidney 06/07/2021   Anemia 06/07/2021   Obesity (BMI 30.0-34.9) 06/07/2021   Subdural hematoma ([place]) 06/06/2021   COVID-19 virus  infection 06/06/2021   Hypokalemia 06/06/2021   H/O ischemic right PCA stroke 06/06/2021   History of cardioembolic cerebrovascular accident (CVA) 06/06/2021   Right-sided cerebrovascular accident (CVA) ([place]) 04/14/2021   Nonfunctioning kidney 04/14/2021   Goals of care, counseling/discussion 04/03/2021   Ureteral stent occlusion ([place]) 04/02/2021   CKD (chronic kidney disease) stage 3a 03/11/2021   Major depressive disorder, recurrent episode, in partial remission ([place]) 03/11/2021   Hypertension    Sleep apnea    GAD (generalized anxiety disorder) 07/20/2019    REFERRING DIAG: R26.0 (ICD-10-CM) - Ataxic gait  THERAPY DIAG:  Other lack of coordination  Other abnormalities of gait and mobility  Unsteadiness on feet  Muscle weakness (generalized)  Rationale for Evaluation and Treatment Rehabilitation  PERTINENT HISTORY: acute/subacute R PCA stroke kidney CKD, hypertension, prior CVA   PRECAUTIONS: fall- check BP  SUBJECTIVE: Patient reports doing well- took rx this AM- no falls. Denies using power tools or climbing onto ladders. Patient reports that he hasn't slept in a few nights.   PAIN:  Are you having pain? No VITALS:   BP: 156/89  TODAY'S TREATMENT:  NMR:  -scifit level 1 x5 mins, emphasis on steps/ min >60   BP after: 154/91 -visual scanning task for numbers in hallway (intermittent cues to scan fully L)  -Standing Balance: Surface: Floor Position: Narrow  Base of Support Feet Hip Width Apart Completed with: Eyes Open and Eyes Closed;     PATIENT EDUCATION: Education details: home safety, continue HEP Person educated: Patient and Spouse Education method: Explanation Education comprehension: verbalized understanding and needs further education     HOME EXERCISE PROGRAM: Access Code: [email or web address] URL: [email or web address] Date: 02/05/2022 Prepared by: Estevan Ryder  Exercises - Sit to Stand with Counter Support  - 1 x daily - 7 x weekly - 3  sets - 10 reps - Standing March with Counter Support  - 1 x daily - 7 x weekly - 3 sets - 10 reps - Side Stepping with Counter Support  - 1 x daily - 7 x weekly - 3 sets - 10 reps       GOALS: Goals reviewed with patient? Yes   SHORT TERM GOALS: Target date: 02/23/2022   Pt will be independent with initial HEP for improved functional strength and balance  Baseline: to be provided  Goal status: INITIAL   2.  Pt will improve TUG to </= 60 secs to demonstrated reduced fall risk   Baseline: 73 Goal status: INITIAL   3.  Pt will improve FGA to >/= 13/30 to demonstrate improved balance and reduced fall risk   Baseline: 8/30 Goal status: INITIAL   4.  Patients gait speed to be assessed and LTG to be added as appropriate  Baseline: .4ms Goal status: MET     LONG TERM GOALS: Target date: 03/16/2022   Pt will be independent with final HEP for improved functional strength and balance  Baseline: to be provided Goal status: INITIAL   2.  Pt will improve TUG to </= 45 secs to demonstrated reduced fall risk   Baseline: 73s  Goal status: INITIAL   3.  Pt will improve FGA to >/= 16/30 to demonstrate improved balance and reduced fall risk   Baseline: 8/30 Goal status: INITIAL  4. Pt will improve gait speed to >/= .676m to demonstrate improved community ambulation  Baseline: .4274m Goal status: NEW   ASSESSMENT:   CLINICAL IMPRESSION: Patient seen for skilled PT session with emphasis on visual scanning and balance strategies. His BP was much better controlled. He does require cues to scan fully to the L. Discussed mobility implications with this including avoiding obstacles. Patient demonstrating poor balance strategies and limited ability to regain his balance in a timely fashion without external support. Continue POC.     OBJECTIVE IMPAIRMENTS Abnormal gait, decreased activity tolerance, decreased balance, decreased cognition, decreased coordination, decreased endurance,  decreased knowledge of condition, decreased knowledge of use of DME, decreased safety awareness, impaired perceived functional ability, and impaired vision/preception.    ACTIVITY LIMITATIONS carrying, lifting, bending, standing, squatting, stairs, transfers, bed mobility, and locomotion level   PARTICIPATION LIMITATIONS: cleaning, medication management, interpersonal relationship, driving, shopping, and community activity   PERSONAL FACTORS Age, Behavior pattern, Past/current experiences, Time since onset of injury/illness/exacerbation, and 3+ comorbidities: HTN, CKD, homonymous hemianopsia, cog deficits  are also affecting patient's functional outcome.    REHAB POTENTIAL: Fair time since onset   CLINICAL DECISION MAKING: Evolving/moderate complexity   EVALUATION COMPLEXITY: Moderate   PLAN: PT FREQUENCY: 2x/week   PT DURATION: 6 weeks   PLANNED INTERVENTIONS: Therapeutic exercises, Therapeutic activity, Neuromuscular re-education, Balance training, Gait training, Patient/Family education, Self Care, Joint mobilization, Stair training, Vestibular training, Visual/preceptual remediation/compensation, Orthotic/Fit training, DME instructions, Aquatic Therapy, Cognitive remediation, Wheelchair mobility training, Manual therapy, and Re-evaluation   PLAN FOR NEXT SESSION: household distance gait  with rollator and turns, step length, foot clearance, balance strategies   Debbora Dus, PT Debbora Dus, PT, DPT, CBIS  02/07/2022, 9:02 AM

## 2022-02-12 ENCOUNTER — Ambulatory Visit (HOSPITAL_COMMUNITY)
Admission: RE | Admit: 2022-02-12 | Discharge: 2022-02-12 | Disposition: A | Discharge status: Home / Self Care | Payer: Medicare PPO | Source: Ambulatory Visit | Attending: Neurology | Admitting: Neurology

## 2022-02-12 ENCOUNTER — Telehealth: Payer: Self-pay

## 2022-02-12 DIAGNOSIS — I672 Cerebral atherosclerosis: Secondary | ICD-10-CM | POA: Insufficient documentation

## 2022-02-12 NOTE — Progress Notes (Signed)
Kindly inform the patient that carotid ultrasound study did not show significant blockages of either carotid artery in the neck

## 2022-02-12 NOTE — Telephone Encounter (Signed)
Pt verified by name and DOB,  normal results given per provider, pt voiced understanding all question answered. °

## 2022-02-12 NOTE — Telephone Encounter (Signed)
-----   Message from Micki Riley, MD sent at 02/12/2022  2:11 PM EDT ----- Joneen Roach inform the patient that carotid ultrasound study did not show significant blockages of either carotid artery in the neck

## 2022-02-13 ENCOUNTER — Ambulatory Visit: Payer: Medicare PPO

## 2022-02-13 DIAGNOSIS — R2689 Other abnormalities of gait and mobility: Secondary | ICD-10-CM

## 2022-02-13 DIAGNOSIS — M6281 Muscle weakness (generalized): Secondary | ICD-10-CM | POA: Diagnosis not present

## 2022-02-13 DIAGNOSIS — R278 Other lack of coordination: Secondary | ICD-10-CM

## 2022-02-13 DIAGNOSIS — R2681 Unsteadiness on feet: Secondary | ICD-10-CM | POA: Diagnosis not present

## 2022-02-13 DIAGNOSIS — R260 Ataxic gait: Secondary | ICD-10-CM | POA: Diagnosis not present

## 2022-02-13 NOTE — Therapy (Signed)
OUTPATIENT PHYSICAL THERAPY TREATMENT NOTE   Patient Name: Nicholas Mckenzie MRN: [id number] DOB:08-31-1945, 76 y.o., male Today's Date: 02/13/2022  PCP: Early Osmond, MD REFERRING PROVIDER: [name], MD   END OF SESSION:   PT End of Session - 02/13/22 0839     Visit Number 4    Number of Visits 13    Date for PT Re-Evaluation 03/16/22    Authorization Type [employer]    PT Start Time 0840    PT Stop Time 0928    PT Time Calculation (min) 48 min    Equipment Utilized During Treatment Gait belt    Activity Tolerance Patient tolerated treatment well    Behavior During Therapy [facility] for tasks assessed/performed;Impulsive             Past Medical History:  Diagnosis Date   CKD (chronic kidney disease) stage 3, GFR 30-59 ml/min (HCC)    Hypertension    Kidney stones    Sleep apnea    Past Surgical History:  Procedure Laterality Date   BLADDER STONE REMOVAL  12/2021   CATARACT EXTRACTION     IR NEPHROSTOMY EXCHANGE RIGHT  06/07/2021   IR NEPHROSTOMY EXCHANGE RIGHT  08/22/2021   IR NEPHROSTOMY PLACEMENT RIGHT  04/07/2021   IR PATIENT EVAL TECH 0-60 MINS  06/02/2021   kidney stent     MANDIBLE FRACTURE SURGERY     NEPHRECTOMY Right 01/07/2022   Patient Active Problem List   Diagnosis Date Noted   Hypernatremia 08/23/2021   Malnutrition of moderate degree 08/17/2021   Acute respiratory failure with hypoxia (HCC) 08/16/2021   Severe sepsis ([place]) 08/16/2021   Sore throat 08/16/2021   Hx of subdural hematoma 08/16/2021   Nephrolithiasis 08/16/2021   Stage 3a chronic kidney disease (CKD) ([place]) 08/16/2021   Obesity (BMI 30-39.9) 08/16/2021   OSA (obstructive sleep apnea) 08/16/2021   Aspiration pneumonia ([place]) 08/15/2021   Snoring 08/08/2021   Frequent urination 06/07/2021   Acute lower UTI 06/07/2021   Ascending aortic aneurysm ([place]) 06/07/2021   Atrophy of right kidney 06/07/2021   Anemia 06/07/2021   Obesity (BMI 30.0-34.9) 06/07/2021   Subdural hematoma  ([place]) 06/06/2021   COVID-19 virus infection 06/06/2021   Hypokalemia 06/06/2021   H/O ischemic right PCA stroke 06/06/2021   History of cardioembolic cerebrovascular accident (CVA) 06/06/2021   Right-sided cerebrovascular accident (CVA) ([place]) 04/14/2021   Nonfunctioning kidney 04/14/2021   Goals of care, counseling/discussion 04/03/2021   Ureteral stent occlusion ([place]) 04/02/2021   CKD (chronic kidney disease) stage 3a 03/11/2021   Major depressive disorder, recurrent episode, in partial remission ([place]) 03/11/2021   Hypertension    Sleep apnea    GAD (generalized anxiety disorder) 07/20/2019    REFERRING DIAG: R26.0 (ICD-10-CM) - Ataxic gait  THERAPY DIAG:  Other lack of coordination  Other abnormalities of gait and mobility  Unsteadiness on feet  Muscle weakness (generalized)  Rationale for Evaluation and Treatment Rehabilitation  PERTINENT HISTORY: acute/subacute R PCA stroke kidney CKD, hypertension, prior CVA   PRECAUTIONS: fall- check BP  SUBJECTIVE: Patient reports doing well. Denies falls/near falls. SO reports that he ate ~8# of pistachios over the weekend and subsequently threw up a lot. Feeling better today.   PAIN:  Are you having pain? No VITALS:   BP: 139/81  BP after stepping exercise: 166/83  TODAY'S TREATMENT:  NMR: -ant stepping strategy to target with emphasis on full weight shift to contralateral side (increased difficulty with full weight shift R)  -standing on foam  in // bars: normal BOS, NBOS, EO/EC  -lateral stepping on foam balance beam in // bars  -tandem walking fwd/backward on foam balance beam B UE support-> U UE support but increased reliance on UE noted  -Scifit level 1 x5 mins B UE/LE     PATIENT EDUCATION: Education details: continue HEP Person educated: Patient and Spouse Education method: Explanation Education comprehension: verbalized understanding and needs further education     HOME EXERCISE PROGRAM: Access Code:  [email or web address] URL: [email or web address] Date: 02/05/2022 Prepared by: Estevan Ryder  Exercises - Sit to Stand with Counter Support  - 1 x daily - 7 x weekly - 3 sets - 10 reps - Standing March with Counter Support  - 1 x daily - 7 x weekly - 3 sets - 10 reps - Side Stepping with Counter Support  - 1 x daily - 7 x weekly - 3 sets - 10 reps       GOALS: Goals reviewed with patient? Yes   SHORT TERM GOALS: Target date: 02/23/2022   Pt will be independent with initial HEP for improved functional strength and balance  Baseline: to be provided  Goal status: INITIAL   2.  Pt will improve TUG to </= 60 secs to demonstrated reduced fall risk   Baseline: 73 Goal status: INITIAL   3.  Pt will improve FGA to >/= 13/30 to demonstrate improved balance and reduced fall risk   Baseline: 8/30 Goal status: INITIAL   4.  Patients gait speed to be assessed and LTG to be added as appropriate  Baseline: .81ms Goal status: MET     LONG TERM GOALS: Target date: 03/16/2022   Pt will be independent with final HEP for improved functional strength and balance  Baseline: to be provided Goal status: INITIAL   2.  Pt will improve TUG to </= 45 secs to demonstrated reduced fall risk   Baseline: 73s  Goal status: INITIAL   3.  Pt will improve FGA to >/= 16/30 to demonstrate improved balance and reduced fall risk   Baseline: 8/30 Goal status: INITIAL  4. Pt will improve gait speed to >/= .660m to demonstrate improved community ambulation  Baseline: .4253m Goal status: NEW   ASSESSMENT:   CLINICAL IMPRESSION: Patient seen for skilled PT session with emphasis on balance retraining and R LE NMR. He continues to have difficulty fully weight shifting R due to impaired proprioception and weakness. Due to impaired cognition, patient with limited response to verbal and tactile cuing for improved weight shifting. Continue POC.     OBJECTIVE IMPAIRMENTS Abnormal gait, decreased activity  tolerance, decreased balance, decreased cognition, decreased coordination, decreased endurance, decreased knowledge of condition, decreased knowledge of use of DME, decreased safety awareness, impaired perceived functional ability, and impaired vision/preception.    ACTIVITY LIMITATIONS carrying, lifting, bending, standing, squatting, stairs, transfers, bed mobility, and locomotion level   PARTICIPATION LIMITATIONS: cleaning, medication management, interpersonal relationship, driving, shopping, and community activity   PERSONAL FACTORS Age, Behavior pattern, Past/current experiences, Time since onset of injury/illness/exacerbation, and 3+ comorbidities: HTN, CKD, homonymous hemianopsia, cog deficits  are also affecting patient's functional outcome.    REHAB POTENTIAL: Fair time since onset   CLINICAL DECISION MAKING: Evolving/moderate complexity   EVALUATION COMPLEXITY: Moderate   PLAN: PT FREQUENCY: 2x/week   PT DURATION: 6 weeks   PLANNED INTERVENTIONS: Therapeutic exercises, Therapeutic activity, Neuromuscular re-education, Balance training, Gait training, Patient/Family education, Self Care, Joint mobilization, Stair training, Vestibular training, Visual/preceptual remediation/compensation, Orthotic/Fit training, DME instructions, Aquatic  Therapy, Cognitive remediation, Wheelchair mobility training, Manual therapy, and Re-evaluation   PLAN FOR NEXT SESSION: household distance gait with rollator and turns, step length, foot clearance, balance strategies, weight shift R   Debbora Dus, PT Debbora Dus, PT, DPT, CBIS  02/13/2022, 9:30 AM

## 2022-02-16 ENCOUNTER — Ambulatory Visit: Payer: Medicare PPO | Admitting: Physical Therapy

## 2022-02-17 ENCOUNTER — Other Ambulatory Visit: Payer: Self-pay

## 2022-02-17 DIAGNOSIS — Z8616 Personal history of COVID-19: Secondary | ICD-10-CM | POA: Diagnosis not present

## 2022-02-17 DIAGNOSIS — Z7982 Long term (current) use of aspirin: Secondary | ICD-10-CM | POA: Insufficient documentation

## 2022-02-17 DIAGNOSIS — I129 Hypertensive chronic kidney disease with stage 1 through stage 4 chronic kidney disease, or unspecified chronic kidney disease: Secondary | ICD-10-CM | POA: Insufficient documentation

## 2022-02-17 DIAGNOSIS — Z87898 Personal history of other specified conditions: Secondary | ICD-10-CM | POA: Insufficient documentation

## 2022-02-17 DIAGNOSIS — R339 Retention of urine, unspecified: Secondary | ICD-10-CM | POA: Insufficient documentation

## 2022-02-17 DIAGNOSIS — N1831 Chronic kidney disease, stage 3a: Secondary | ICD-10-CM | POA: Diagnosis not present

## 2022-02-17 DIAGNOSIS — Z79899 Other long term (current) drug therapy: Secondary | ICD-10-CM | POA: Insufficient documentation

## 2022-02-18 ENCOUNTER — Emergency Department (HOSPITAL_BASED_OUTPATIENT_CLINIC_OR_DEPARTMENT_OTHER)
Admission: EM | Admit: 2022-02-18 | Discharge: 2022-02-18 | Disposition: A | Discharge status: Home / Self Care | Payer: Medicare PPO | Attending: Emergency Medicine | Admitting: Emergency Medicine

## 2022-02-18 ENCOUNTER — Encounter (HOSPITAL_BASED_OUTPATIENT_CLINIC_OR_DEPARTMENT_OTHER): Payer: Self-pay | Admitting: Emergency Medicine

## 2022-02-18 DIAGNOSIS — Z87898 Personal history of other specified conditions: Secondary | ICD-10-CM

## 2022-02-18 LAB — CBC WITH DIFFERENTIAL/PLATELET
Abs Immature Granulocytes: 0.04 10*3/uL (ref 0.00–0.07)
Basophils Absolute: 0.1 10*3/uL (ref 0.0–0.1)
Basophils Relative: 1 %
Eosinophils Absolute: 0.5 10*3/uL (ref 0.0–0.5)
Eosinophils Relative: 5 %
HCT: 43.8 % (ref 39.0–52.0)
Hemoglobin: 14.2 g/dL (ref 13.0–17.0)
Immature Granulocytes: 0 %
Lymphocytes Relative: 17 %
Lymphs Abs: 1.8 10*3/uL (ref 0.7–4.0)
MCH: 27.6 pg (ref 26.0–34.0)
MCHC: 32.4 g/dL (ref 30.0–36.0)
MCV: 85.2 fL (ref 80.0–100.0)
Monocytes Absolute: 0.7 10*3/uL (ref 0.1–1.0)
Monocytes Relative: 7 %
Neutro Abs: 7.9 10*3/uL — ABNORMAL HIGH (ref 1.7–7.7)
Neutrophils Relative %: 70 %
Platelets: 174 10*3/uL (ref 150–400)
RBC: 5.14 MIL/uL (ref 4.22–5.81)
RDW: 15.5 % (ref 11.5–15.5)
WBC: 11.1 10*3/uL — ABNORMAL HIGH (ref 4.0–10.5)
nRBC: 0 % (ref 0.0–0.2)

## 2022-02-18 LAB — BASIC METABOLIC PANEL
Anion gap: 11 (ref 5–15)
BUN: 33 mg/dL — ABNORMAL HIGH (ref 8–23)
CO2: 24 mmol/L (ref 22–32)
Calcium: 10.1 mg/dL (ref 8.9–10.3)
Chloride: 105 mmol/L (ref 98–111)
Creatinine, Ser: 1.63 mg/dL — ABNORMAL HIGH (ref 0.61–1.24)
GFR, Estimated: 44 mL/min — ABNORMAL LOW (ref >60)
Glucose, Bld: 112 mg/dL — ABNORMAL HIGH (ref 70–99)
Potassium: 3.4 mmol/L — ABNORMAL LOW (ref 3.5–5.1)
Sodium: 140 mmol/L (ref 135–145)

## 2022-02-18 LAB — URINALYSIS, ROUTINE W REFLEX MICROSCOPIC
Bilirubin Urine: NEGATIVE
Glucose, UA: NEGATIVE mg/dL
Hgb urine dipstick: NEGATIVE
Ketones, ur: NEGATIVE mg/dL
Nitrite: NEGATIVE
Protein, ur: 30 mg/dL — AB
Specific Gravity, Urine: 1.017 (ref 1.005–1.030)
WBC, UA: >50 WBC/hpf — ABNORMAL HIGH (ref 0–5)
pH: 5.5 (ref 5.0–8.0)

## 2022-02-18 MED ORDER — CEFUROXIME AXETIL 500 MG PO TABS
500.0000 mg | ORAL_TABLET | Freq: Two times a day (BID) | ORAL | 0 refills | Status: AC
Start: 2022-02-18 — End: 2022-02-25

## 2022-02-18 NOTE — ED Triage Notes (Signed)
Last void was around noon. Spoke with surgeon and told to come in and have foley placed. Pain in low abdo/bladder area Hx of kidney removal 4 weeks ago Kidney stones removal/stents

## 2022-02-18 NOTE — ED Notes (Signed)
Residual Bladder Scan: Provider at bedside for bladder scan, aware of data.

## 2022-02-18 NOTE — ED Notes (Signed)
Patient incontinent to urine, able to void additional into urinal while standing at bedside. Urine send for to lab.

## 2022-02-18 NOTE — ED Provider Notes (Signed)
[facility] EMERGENCY DEPT Provider Note  CSN: [id number] Arrival date & time: 02/17/22 2350  Chief Complaint(s) Urinary Retention  HPI Nicholas Mckenzie is a 76 y.o. male with a past medical history listed below including nonfunctioning right kidney status post nephrectomy, renal stones, ureteral stenosis/stricture status post recent excision here for suprapubic abdominal discomfort related to urinary retention.  Patient reports last voiding around noon, almost 12 hours ago.  Pain was gradual onset and worsening throughout the day.  No associated fevers or chills.  Since arriving here he was able to void approximately 100 cc of urine which resolved his discomfort.  He states that since his recent procedure, he has been having urinary leaking.  Denies any dysuria.  No hematuria.  The history is provided by the patient.    Past Medical History Past Medical History:  Diagnosis Date   CKD (chronic kidney disease) stage 3, GFR 30-59 ml/min (HCC)    Hypertension    Kidney stones    Sleep apnea    Patient Active Problem List   Diagnosis Date Noted   Hypernatremia 08/23/2021   Malnutrition of moderate degree 08/17/2021   Acute respiratory failure with hypoxia (HCC) 08/16/2021   Severe sepsis (HCC) 08/16/2021   Sore throat 08/16/2021   Hx of subdural hematoma 08/16/2021   Nephrolithiasis 08/16/2021   Stage 3a chronic kidney disease (CKD) (HCC) 08/16/2021   Obesity (BMI 30-39.9) 08/16/2021   OSA (obstructive sleep apnea) 08/16/2021   Aspiration pneumonia (HCC) 08/15/2021   Snoring 08/08/2021   Frequent urination 06/07/2021   Acute lower UTI 06/07/2021   Ascending aortic aneurysm (HCC) 06/07/2021   Atrophy of right kidney 06/07/2021   Anemia 06/07/2021   Obesity (BMI 30.0-34.9) 06/07/2021   Subdural hematoma (HCC) 06/06/2021   COVID-19 virus infection 06/06/2021   Hypokalemia 06/06/2021   H/O ischemic right PCA stroke 06/06/2021   History of cardioembolic cerebrovascular  accident (CVA) 06/06/2021   Right-sided cerebrovascular accident (CVA) (HCC) 04/14/2021   Nonfunctioning kidney 04/14/2021   Goals of care, counseling/discussion 04/03/2021   Ureteral stent occlusion (HCC) 04/02/2021   CKD (chronic kidney disease) stage 3a 03/11/2021   Major depressive disorder, recurrent episode, in partial remission (HCC) 03/11/2021   Hypertension    Sleep apnea    GAD (generalized anxiety disorder) 07/20/2019   Home Medication(s) Prior to Admission medications   Medication Sig Start Date End Date Taking? Authorizing Provider  cefUROXime (CEFTIN) 500 MG tablet Take 1 tablet (500 mg total) by mouth 2 (two) times daily with a meal for 7 days. 02/18/22 02/25/22 Yes Anquanette Bahner, Amadeo Garnet, MD  acetaminophen (TYLENOL) 325 MG tablet Take 2 tablets (650 mg total) by mouth every 6 (six) hours as needed for mild pain (or Fever >/= 101). 08/25/21   Rodolph Bong, MD  amLODipine (NORVASC) 5 MG tablet Take 1 tablet (5 mg total) by mouth in the morning and at bedtime. Patient taking differently: Take 10 mg by mouth once. Pt reports taking once in AM 06/08/21   Hongalgi, Theadora Rama D, MD  aspirin EC 81 MG tablet Take 1 tablet (81 mg total) by mouth daily. Swallow whole. 01/31/22   Micki Riley, MD  B Complex-C CAPS Take 1 capsule by mouth daily.    [provider]  Evolocumab (REPATHA SURECLICK) 140 MG/ML SOAJ Inject 140 mg into the skin every 14 (fourteen) days. 02/04/22   Micki Riley, MD  FLUoxetine (PROZAC) 40 MG capsule Take 40 mg by mouth daily.    [provider]  lactobacillus (FLORANEX/LACTINEX) PACK Take 1 packet (1 g total) by mouth 2 (two) times daily. 04/13/21   Lanae BoastKc, Ramesh, MD  melatonin 3 MG TABS tablet Take 3 mg by mouth at bedtime.    [provider]  Multiple Vitamins-Minerals (MULTIVITAMIN WITH MINERALS) tablet Take 1 tablet by mouth daily.    [provider]  Omega-3 Fatty Acids (FISH OIL PO) Take 1 capsule by mouth daily.     [provider]                                                                                                                                    Allergies Statins and Tape  Review of Systems Review of Systems As noted in HPI  Physical Exam Vital Signs  I have reviewed the triage vital signs BP (!) 152/82 (BP Location: Left Arm)   Pulse 76   Temp 98 F (36.7 C)   Resp 17   SpO2 98%   Physical Exam Vitals reviewed.  Constitutional:      General: He is not in acute distress.    Appearance: He is well-developed. He is not diaphoretic.  HENT:     Head: Normocephalic and atraumatic.     Right Ear: External ear normal.     Left Ear: External ear normal.     Nose: Nose normal.     Mouth/Throat:     Mouth: Mucous membranes are moist.  Eyes:     General: No scleral icterus.    Conjunctiva/sclera: Conjunctivae normal.  Neck:     Trachea: Phonation normal.  Cardiovascular:     Rate and Rhythm: Normal rate and regular rhythm.  Pulmonary:     Effort: Pulmonary effort is normal. No respiratory distress.     Breath sounds: No stridor.  Abdominal:     General: There is no distension.     Tenderness: There is no abdominal tenderness.  Genitourinary:    Penis: Circumcised. No phimosis, paraphimosis, erythema, tenderness, discharge, swelling or lesions.      Testes: Normal.  Musculoskeletal:        General: Normal range of motion.     Cervical back: Normal range of motion.  Neurological:     Mental Status: He is alert and oriented to person, place, and time.  Psychiatric:        Behavior: Behavior normal.     ED Results and Treatments Labs (all labs ordered are listed, but only abnormal results are displayed) Labs Reviewed  URINALYSIS, ROUTINE W REFLEX MICROSCOPIC - Abnormal; Notable for the following components:      Result Value   APPearance HAZY (*)    Protein, ur 30 (*)    Leukocytes,Ua LARGE (*)    WBC, UA >50 (*)    Bacteria, UA FEW (*)    All other  components within normal limits  BASIC METABOLIC PANEL - Abnormal; Notable for the following components:  Potassium 3.4 (*)    Glucose, Bld 112 (*)    BUN 33 (*)    Creatinine, Ser 1.63 (*)    GFR, Estimated 44 (*)    All other components within normal limits  CBC WITH DIFFERENTIAL/PLATELET - Abnormal; Notable for the following components:   WBC 11.1 (*)    Neutro Abs 7.9 (*)    All other components within normal limits  URINE CULTURE                                                                                                                         EKG  EKG Interpretation  Date/Time:    Ventricular Rate:    PR Interval:    QRS Duration:   QT Interval:    QTC Calculation:   R Axis:     Text Interpretation:         Radiology No results found.  Medications Ordered in ED Medications - No data to display                                                                                                                                   Procedures Procedures  (including critical care time)  Medical Decision Making / ED Course   Medical Decision Making Amount and/or Complexity of Data Reviewed External Data Reviewed: notes.    Details: Urology note summary of recent procedure noted above. Recent urine culture noted below  Labs: ordered. Decision-making details documented in ED Course.  Risk Prescription drug management.    Suprapubic discomfort related to urinary retention Now resolved after voiding approximately 100 cc Postvoid residual of approximately 200 cc. UA sent and notable for positive leukocytes and WBCs with few bacteria.  This may be related to inflammatory changes from recent procedures but cannot rule out infectious process. We will send for culture. Patient's last culture grew out multidrug-resistant Enterobacter. At that time the patient was given Ceftin which seemed to resolve his urinary symptoms. We will place patient on same prescription  while we await culture results.  Given the single kidney with urinary retention, screening labs were ordered. Notable for leukocytosis which may be related to demargination from his pain. He is afebrile with no abdominal discomfort or flank pain concerning for ascending infection. The patient's renal function is close to his baseline.  We discussed the possibility of putting in a Foley catheter but I informed him  that I do not have a strong inclination to do so given his recent procedure, and known strictures.  Since he is currently asymptomatic and has mild retention but still having urinary output, patient denied with shared decision making opted to refrain from Foley insertion.  Recommended he follow-up closely with urology.   Final Clinical Impression(s) / ED Diagnoses Final diagnoses:  H/O urinary retention    The patient appears reasonably screened and/or stabilized for discharge and I doubt any other medical condition or other [facility] requiring further screening, evaluation, or treatment in the ED at this time. I have discussed the findings, Dx and Tx plan with the patient/family who expressed understanding and agree(s) with the plan. Discharge instructions discussed at length. The patient/family was given strict return precautions who verbalized understanding of the instructions. No further questions at time of discharge.  Disposition: Discharge  Condition: Good  ED Discharge Orders          Ordered    cefUROXime (CEFTIN) 500 MG tablet  2 times daily with meals        02/18/22 0247             Follow Up: Beatriz Chancellor, MD [address] [place] [address] [phone]  Call on 02/19/2022 to schedule an appointment for close follow up            This chart was dictated using voice recognition software.  Despite best efforts to proofread,  errors can occur which can change the documentation meaning.    Nira Conn, MD 02/18/22  [phone]

## 2022-02-19 ENCOUNTER — Emergency Department (HOSPITAL_COMMUNITY)
Admission: EM | Admit: 2022-02-19 | Discharge: 2022-02-20 | Disposition: A | Discharge status: Home / Self Care | Payer: Medicare PPO | Attending: Emergency Medicine | Admitting: Emergency Medicine

## 2022-02-19 ENCOUNTER — Other Ambulatory Visit: Payer: Self-pay

## 2022-02-19 ENCOUNTER — Encounter (HOSPITAL_COMMUNITY): Payer: Self-pay

## 2022-02-19 ENCOUNTER — Ambulatory Visit: Payer: Medicare PPO

## 2022-02-19 DIAGNOSIS — R2689 Other abnormalities of gait and mobility: Secondary | ICD-10-CM

## 2022-02-19 DIAGNOSIS — N189 Chronic kidney disease, unspecified: Secondary | ICD-10-CM | POA: Diagnosis not present

## 2022-02-19 DIAGNOSIS — Z5321 Procedure and treatment not carried out due to patient leaving prior to being seen by health care provider: Secondary | ICD-10-CM | POA: Insufficient documentation

## 2022-02-19 DIAGNOSIS — M6281 Muscle weakness (generalized): Secondary | ICD-10-CM | POA: Diagnosis not present

## 2022-02-19 DIAGNOSIS — R260 Ataxic gait: Secondary | ICD-10-CM | POA: Diagnosis not present

## 2022-02-19 DIAGNOSIS — R339 Retention of urine, unspecified: Secondary | ICD-10-CM | POA: Diagnosis not present

## 2022-02-19 DIAGNOSIS — R2681 Unsteadiness on feet: Secondary | ICD-10-CM

## 2022-02-19 DIAGNOSIS — R1030 Lower abdominal pain, unspecified: Secondary | ICD-10-CM | POA: Diagnosis not present

## 2022-02-19 DIAGNOSIS — R278 Other lack of coordination: Secondary | ICD-10-CM

## 2022-02-19 DIAGNOSIS — Z936 Other artificial openings of urinary tract status: Secondary | ICD-10-CM | POA: Diagnosis not present

## 2022-02-19 NOTE — ED Notes (Signed)
Bladder scanned pt. Bladder showed then pt had large void. Pt decided to leave since bladder felt empty.

## 2022-02-19 NOTE — Therapy (Signed)
OUTPATIENT PHYSICAL THERAPY TREATMENT NOTE   Patient Name: Nicholas Mckenzie MRN: [id number] DOB:Mar 04, 1946, 76 y.o., male Today's Date: 02/19/2022  PCP: Early Osmond, MD REFERRING PROVIDER: [name], MD   END OF SESSION:   PT End of Session - 02/19/22 0803     Visit Number 5    Number of Visits 13    Date for PT Re-Evaluation 03/16/22    Authorization Type [employer]    PT Start Time 0802    PT Stop Time 0843    PT Time Calculation (min) 41 min    Equipment Utilized During Treatment Gait belt    Activity Tolerance Patient tolerated treatment well    Behavior During Therapy [facility] for tasks assessed/performed;Impulsive             Past Medical History:  Diagnosis Date   CKD (chronic kidney disease) stage 3, GFR 30-59 ml/min (HCC)    Hypertension    Kidney stones    Sleep apnea    Past Surgical History:  Procedure Laterality Date   BLADDER STONE REMOVAL  12/2021   CATARACT EXTRACTION     IR NEPHROSTOMY EXCHANGE RIGHT  06/07/2021   IR NEPHROSTOMY EXCHANGE RIGHT  08/22/2021   IR NEPHROSTOMY PLACEMENT RIGHT  04/07/2021   IR PATIENT EVAL TECH 0-60 MINS  06/02/2021   kidney stent     MANDIBLE FRACTURE SURGERY     NEPHRECTOMY Right 01/07/2022   Patient Active Problem List   Diagnosis Date Noted   Hypernatremia 08/23/2021   Malnutrition of moderate degree 08/17/2021   Acute respiratory failure with hypoxia (HCC) 08/16/2021   Severe sepsis ([place]) 08/16/2021   Sore throat 08/16/2021   Hx of subdural hematoma 08/16/2021   Nephrolithiasis 08/16/2021   Stage 3a chronic kidney disease (CKD) ([place]) 08/16/2021   Obesity (BMI 30-39.9) 08/16/2021   OSA (obstructive sleep apnea) 08/16/2021   Aspiration pneumonia ([place]) 08/15/2021   Snoring 08/08/2021   Frequent urination 06/07/2021   Acute lower UTI 06/07/2021   Ascending aortic aneurysm ([place]) 06/07/2021   Atrophy of right kidney 06/07/2021   Anemia 06/07/2021   Obesity (BMI 30.0-34.9) 06/07/2021   Subdural hematoma  ([place]) 06/06/2021   COVID-19 virus infection 06/06/2021   Hypokalemia 06/06/2021   H/O ischemic right PCA stroke 06/06/2021   History of cardioembolic cerebrovascular accident (CVA) 06/06/2021   Right-sided cerebrovascular accident (CVA) ([place]) 04/14/2021   Nonfunctioning kidney 04/14/2021   Goals of care, counseling/discussion 04/03/2021   Ureteral stent occlusion ([place]) 04/02/2021   CKD (chronic kidney disease) stage 3a 03/11/2021   Major depressive disorder, recurrent episode, in partial remission ([place]) 03/11/2021   Hypertension    Sleep apnea    GAD (generalized anxiety disorder) 07/20/2019    REFERRING DIAG: R26.0 (ICD-10-CM) - Ataxic gait  THERAPY DIAG:  Other lack of coordination  Other abnormalities of gait and mobility  Unsteadiness on feet  Muscle weakness (generalized)  Rationale for Evaluation and Treatment Rehabilitation  PERTINENT HISTORY: acute/subacute R PCA stroke kidney CKD, hypertension, prior CVA   PRECAUTIONS: fall- check BP  SUBJECTIVE: Patient reports doing well now. Was in the ER on Saturday night due to urine retention. SO reporting that she gave patient something to help him relax his bladder and that helped him urinate before he needed a foley. Patient and SO reporting that patient consumed a lot of sugar and caffeine and that likely contributed.   PAIN:  Are you having pain? No VITALS:   BP: 150/74  BP after scifit and // bars  exercises: 137/85    TODAY'S TREATMENT:  NMR: -Scifit level 1 x8 mins for improved large amplitude reciprocal coordination and endurance -in // bars:  -standing on foam EO/EC, normal BOS/NBOS, head turns   -standing on rockerboard A/P EO  -Biased R LE sit <> stand x8 with CGA  Self care/home management:  -using external cues as reminders for appt dates/exercises/medications instead of relying on wife exclusively     PATIENT EDUCATION: Education details: continue HEP Person educated: Patient and Spouse Education  method: Explanation Education comprehension: verbalized understanding and needs further education     HOME EXERCISE PROGRAM: Access Code: [email or web address] URL: [email or web address] Date: 02/05/2022 Prepared by: Estevan Ryder  Exercises - Sit to Stand with Counter Support  - 1 x daily - 7 x weekly - 3 sets - 10 reps - Standing March with Counter Support  - 1 x daily - 7 x weekly - 3 sets - 10 reps - Side Stepping with Counter Support  - 1 x daily - 7 x weekly - 3 sets - 10 reps       GOALS: Goals reviewed with patient? Yes   SHORT TERM GOALS: Target date: 02/23/2022   Pt will be independent with initial HEP for improved functional strength and balance  Baseline: to be provided  Goal status: INITIAL   2.  Pt will improve TUG to </= 60 secs to demonstrated reduced fall risk   Baseline: 73 Goal status: INITIAL   3.  Pt will improve FGA to >/= 13/30 to demonstrate improved balance and reduced fall risk   Baseline: 8/30 Goal status: INITIAL   4.  Patients gait speed to be assessed and LTG to be added as appropriate  Baseline: .56ms Goal status: MET     LONG TERM GOALS: Target date: 03/16/2022   Pt will be independent with final HEP for improved functional strength and balance  Baseline: to be provided Goal status: INITIAL   2.  Pt will improve TUG to </= 45 secs to demonstrated reduced fall risk   Baseline: 73s  Goal status: INITIAL   3.  Pt will improve FGA to >/= 16/30 to demonstrate improved balance and reduced fall risk   Baseline: 8/30 Goal status: INITIAL  4. Pt will improve gait speed to >/= .633m to demonstrate improved community ambulation  Baseline: .4211m Goal status: NEW   ASSESSMENT:   CLINICAL IMPRESSION: Patient seen for skilled PT session with emphasis on balance retraining and R LE NMR. Patient noted to have increased difficulty with fluidity of movement on scifit often "fighting himself" on the bike.  Patient with fair balance on  compliant surfaces. Noted to have inadequate ankle strategy and often delayed responses to LOB requiring last minute use of UE on // bars to regain his balance. Continue POC.     OBJECTIVE IMPAIRMENTS Abnormal gait, decreased activity tolerance, decreased balance, decreased cognition, decreased coordination, decreased endurance, decreased knowledge of condition, decreased knowledge of use of DME, decreased safety awareness, impaired perceived functional ability, and impaired vision/preception.    ACTIVITY LIMITATIONS carrying, lifting, bending, standing, squatting, stairs, transfers, bed mobility, and locomotion level   PARTICIPATION LIMITATIONS: cleaning, medication management, interpersonal relationship, driving, shopping, and community activity   PERSONAL FACTORS Age, Behavior pattern, Past/current experiences, Time since onset of injury/illness/exacerbation, and 3+ comorbidities: HTN, CKD, homonymous hemianopsia, cog deficits  are also affecting patient's functional outcome.    REHAB POTENTIAL: Fair time since onset   CLINICAL DECISION MAKING: Evolving/moderate complexity   EVALUATION COMPLEXITY:  Moderate   PLAN: PT FREQUENCY: 2x/week   PT DURATION: 6 weeks   PLANNED INTERVENTIONS: Therapeutic exercises, Therapeutic activity, Neuromuscular re-education, Balance training, Gait training, Patient/Family education, Self Care, Joint mobilization, Stair training, Vestibular training, Visual/preceptual remediation/compensation, Orthotic/Fit training, DME instructions, Aquatic Therapy, Cognitive remediation, Wheelchair mobility training, Manual therapy, and Re-evaluation   PLAN FOR NEXT SESSION: household distance gait with rollator and turns, step length, foot clearance, balance strategies, weight shift R, gait on compliant surfaces    Debbora Dus, PT Debbora Dus, PT, DPT, CBIS  02/19/2022, 8:45 AM

## 2022-02-19 NOTE — ED Triage Notes (Signed)
Patient reports he has not voided since noon.  Patient has hx of urinary retention and CKD and may other urinary problems.  Was told he had a UTI on Friday but was not given antibiotics.

## 2022-02-20 DIAGNOSIS — Z906 Acquired absence of other parts of urinary tract: Secondary | ICD-10-CM | POA: Diagnosis not present

## 2022-02-20 DIAGNOSIS — N35912 Unspecified bulbous urethral stricture, male: Secondary | ICD-10-CM | POA: Diagnosis not present

## 2022-02-20 DIAGNOSIS — N35011 Post-traumatic bulbous urethral stricture: Secondary | ICD-10-CM | POA: Diagnosis not present

## 2022-02-20 DIAGNOSIS — Z905 Acquired absence of kidney: Secondary | ICD-10-CM | POA: Diagnosis not present

## 2022-02-20 DIAGNOSIS — N3945 Continuous leakage: Secondary | ICD-10-CM | POA: Diagnosis not present

## 2022-02-20 DIAGNOSIS — C61 Malignant neoplasm of prostate: Secondary | ICD-10-CM | POA: Diagnosis not present

## 2022-02-20 LAB — URINE CULTURE: Culture: >=100000 — AB

## 2022-02-20 NOTE — ED Notes (Signed)
Pt called 4x no answer  

## 2022-02-21 ENCOUNTER — Ambulatory Visit: Payer: Medicare PPO

## 2022-02-21 ENCOUNTER — Telehealth: Payer: Self-pay

## 2022-02-21 NOTE — Progress Notes (Addendum)
ED Antimicrobial Stewardship Positive Culture Follow Up   Nicholas Mckenzie is an 76 y.o. male who presented to [facility] on 02/18/2022 with a chief complaint of  Chief Complaint  Patient presents with   Urinary Retention    Recent Results (from the past 720 hour(s))  Urine Culture     Status: Abnormal   Collection Time: 02/18/22  1:13 AM   Specimen: Urine, Clean Catch  Result Value Ref Range Status   Specimen Description   Final    URINE, CLEAN CATCH Performed at [facility], [name] [place], [place] [address]    Special Requests   Final    NONE Performed at [facility], [address], [place], [place] [address]    Culture >=100,000 COLONIES/mL ENTEROBACTER CLOACAE (A)  Final   Report Status 02/20/2022 FINAL  Final   Organism ID, Bacteria ENTEROBACTER CLOACAE (A)  Final      Susceptibility   Enterobacter cloacae - MIC*    CEFAZOLIN >=64 RESISTANT Resistant     CEFEPIME <=0.12 SENSITIVE Sensitive     CIPROFLOXACIN 0.5 INTERMEDIATE Intermediate     GENTAMICIN <=1 SENSITIVE Sensitive     IMIPENEM 1 SENSITIVE Sensitive     NITROFURANTOIN 128 RESISTANT Resistant     TRIMETH/SULFA >=320 RESISTANT Resistant     PIP/TAZO 8 SENSITIVE Sensitive     * >=100,000 COLONIES/mL ENTEROBACTER CLOACAE    [x]  Patient discharged originally with antimicrobial agent and organism is not susceptible, treatment is potentially indicated  Patient with recent urologic procedure with [facility] urology. Patient had follow up on 8/29 with office. [place] ED MD would like to defer to urology for treatment plan in the setting of recent procedure. [facility] urology has been faxed results per request after attempting to speak to provider > 4 times.   ED Provider: Benjiman Core, MD   Francena Hanly 02/21/2022, 10:37 AM Clinical Pharmacist Monday - Friday phone -  [phone] Saturday - Sunday phone - [phone]

## 2022-02-21 NOTE — Telephone Encounter (Signed)
Post ED Visit - Positive Culture Follow-up  Culture report reviewed by antimicrobial stewardship pharmacist: [facility] [x]  , Pharm.D. []  Estill Batten, Pharm.D., BCPS AQ-ID []  , Pharm.D., BCPS []  Celedonio Miyamoto, Pharm.D., BCPS []  [place], [name].D., BCPS, AAHIVP []  , Pharm.D., BCPS, AAHIVP []  Georgina Pillion, PharmD, BCPS []  , PharmD, BCPS []  [place], PharmD, BCPS []  [place], PharmD []  , PharmD, BCPS []  Estella Husk, PharmD  Pharmacy Team []  Lysle Pearl, PharmD []  , PharmD []  Phillips Climes, PharmD []  , Rph []  Agapito Games) , PharmD []  Verlan Friends, PharmD []  , PharmD []  Mervyn Gay, PharmD []  , PharmD []  Vinnie Level, PharmD []  [facility], PharmD []  , PharmD []  Len Childs, PharmD   Positive urine culture Treated with Cefuroxime axetil, organism sensitive to the same and no further patient follow-up is required at this time.  02/21/2022, 11:15 AM

## 2022-02-28 ENCOUNTER — Ambulatory Visit: Payer: Medicare PPO | Attending: Internal Medicine | Admitting: Physical Therapy

## 2022-02-28 ENCOUNTER — Encounter: Payer: Self-pay | Admitting: Physical Therapy

## 2022-02-28 DIAGNOSIS — Z9181 History of falling: Secondary | ICD-10-CM | POA: Insufficient documentation

## 2022-02-28 DIAGNOSIS — R41841 Cognitive communication deficit: Secondary | ICD-10-CM | POA: Insufficient documentation

## 2022-02-28 DIAGNOSIS — R278 Other lack of coordination: Secondary | ICD-10-CM | POA: Insufficient documentation

## 2022-02-28 DIAGNOSIS — R2689 Other abnormalities of gait and mobility: Secondary | ICD-10-CM | POA: Insufficient documentation

## 2022-02-28 DIAGNOSIS — R41844 Frontal lobe and executive function deficit: Secondary | ICD-10-CM | POA: Insufficient documentation

## 2022-02-28 DIAGNOSIS — R41840 Attention and concentration deficit: Secondary | ICD-10-CM | POA: Insufficient documentation

## 2022-02-28 DIAGNOSIS — M6281 Muscle weakness (generalized): Secondary | ICD-10-CM | POA: Diagnosis not present

## 2022-02-28 DIAGNOSIS — R41842 Visuospatial deficit: Secondary | ICD-10-CM | POA: Insufficient documentation

## 2022-02-28 DIAGNOSIS — R2681 Unsteadiness on feet: Secondary | ICD-10-CM | POA: Diagnosis not present

## 2022-02-28 DIAGNOSIS — R471 Dysarthria and anarthria: Secondary | ICD-10-CM | POA: Diagnosis not present

## 2022-02-28 NOTE — Therapy (Signed)
OUTPATIENT PHYSICAL THERAPY TREATMENT NOTE   Patient Name: Nicholas Mckenzie MRN: [id number] DOB:March 01, 1946, 76 y.o., male Today's Date: 02/28/2022  PCP: Early Osmond, MD REFERRING PROVIDER: [name], MD   END OF SESSION:   PT End of Session - 02/28/22 0806     Visit Number 6    Number of Visits 13    Date for PT Re-Evaluation 03/16/22    Authorization Type [employer]    PT Start Time 0804    PT Stop Time 0845    PT Time Calculation (min) 41 min    Equipment Utilized During Treatment Gait belt    Activity Tolerance Patient tolerated treatment well    Behavior During Therapy WFL for tasks assessed/performed;Impulsive             Past Medical History:  Diagnosis Date   CKD (chronic kidney disease) stage 3, GFR 30-59 ml/min (HCC)    Hypertension    Kidney stones    Sleep apnea    Past Surgical History:  Procedure Laterality Date   BLADDER STONE REMOVAL  12/2021   CATARACT EXTRACTION     IR NEPHROSTOMY EXCHANGE RIGHT  06/07/2021   IR NEPHROSTOMY EXCHANGE RIGHT  08/22/2021   IR NEPHROSTOMY PLACEMENT RIGHT  04/07/2021   IR PATIENT EVAL TECH 0-60 MINS  06/02/2021   kidney stent     MANDIBLE FRACTURE SURGERY     NEPHRECTOMY Right 01/07/2022   Patient Active Problem List   Diagnosis Date Noted   Hypernatremia 08/23/2021   Malnutrition of moderate degree 08/17/2021   Acute respiratory failure with hypoxia (HCC) 08/16/2021   Severe sepsis ([place]) 08/16/2021   Sore throat 08/16/2021   Hx of subdural hematoma 08/16/2021   Nephrolithiasis 08/16/2021   Stage 3a chronic kidney disease (CKD) ([place]) 08/16/2021   Obesity (BMI 30-39.9) 08/16/2021   OSA (obstructive sleep apnea) 08/16/2021   Aspiration pneumonia ([place]) 08/15/2021   Snoring 08/08/2021   Frequent urination 06/07/2021   Acute lower UTI 06/07/2021   Ascending aortic aneurysm ([place]) 06/07/2021   Atrophy of right kidney 06/07/2021   Anemia 06/07/2021   Obesity (BMI 30.0-34.9) 06/07/2021   Subdural hematoma  ([place]) 06/06/2021   COVID-19 virus infection 06/06/2021   Hypokalemia 06/06/2021   H/O ischemic right PCA stroke 06/06/2021   History of cardioembolic cerebrovascular accident (CVA) 06/06/2021   Right-sided cerebrovascular accident (CVA) ([place]) 04/14/2021   Nonfunctioning kidney 04/14/2021   Goals of care, counseling/discussion 04/03/2021   Ureteral stent occlusion ([place]) 04/02/2021   CKD (chronic kidney disease) stage 3a 03/11/2021   Major depressive disorder, recurrent episode, in partial remission ([place]) 03/11/2021   Hypertension    Sleep apnea    GAD (generalized anxiety disorder) 07/20/2019    REFERRING DIAG: R26.0 (ICD-10-CM) - Ataxic gait  THERAPY DIAG:  Other abnormalities of gait and mobility  Unsteadiness on feet  Muscle weakness (generalized)  Rationale for Evaluation and Treatment Rehabilitation  PERTINENT HISTORY: acute/subacute R PCA stroke kidney CKD, hypertension, prior CVA   PRECAUTIONS: fall- check BP  SUBJECTIVE: No new complaints. Did see eye doctor yesterday for new glasses. Has bladder procedure coming up in about 2 weeks, as soon as they return from [place].  PAIN:  Are you having pain? No  VITALS: Before BP: 180/80. End of session BP: 160/82      TODAY'S TREATMENT: STRENGTHENING  Discussed current HEP. Has not been consistent with performance past few weeks due to urinary retention/bladder issues. Prior to that was doing them and reports they are still  challenging.    GAIT: Gait pattern: step through pattern, decreased stride length, and narrow BOS Distance walked: 210 x 1, plus around clinic with session Assistive device utilized: Walker - 4 wheeled Level of assistance: SBA and CGA Comments: cues needed to avoid obstacles on right>left side, posture and to stay closer to rollator with gait.     [facility] PT Assessment - 02/28/22 0824       Standardized Balance Assessment   Standardized Balance Assessment Timed Up and Go Test    10 Meter Walk  15.18 sec's= 0.66 m/s     Timed Up and Go Test   TUG Normal TUG    Normal TUG (seconds) 26.72   seconds with only reminder cue needed to turn and sit back down at the end.     Functional Gait  Assessment   Gait assessed  Yes    Gait Level Surface Walks 20 ft, slow speed, abnormal gait pattern, evidence for imbalance or deviates 10-15 in outside of the 12 in walkway width. Requires more than 7 sec to ambulate 20 ft.   10.84 sec's with rollator   Change in Gait Speed Able to change speed, demonstrates mild gait deviations, deviates 6-10 in outside of the 12 in walkway width, or no gait deviations, unable to achieve a major change in velocity, or uses a change in velocity, or uses an assistive device.    Gait with Horizontal Head Turns Performs head turns smoothly with slight change in gait velocity (eg, minor disruption to smooth gait path), deviates 6-10 in outside 12 in walkway width, or uses an assistive device.    Gait with Vertical Head Turns Performs task with slight change in gait velocity (eg, minor disruption to smooth gait path), deviates 6 - 10 in outside 12 in walkway width or uses assistive device    Gait and Pivot Turn Pivot turns safely in greater than 3 sec and stops with no loss of balance, or pivot turns safely within 3 sec and stops with mild imbalance, requires small steps to catch balance.    Step Over Obstacle Cannot perform without assistance.    Gait with Narrow Base of Support Ambulates 7-9 steps.   with rollator support   Gait with Eyes Closed Cannot walk 20 ft without assistance, severe gait deviations or imbalance, deviates greater than 15 in outside 12 in walkway width or will not attempt task.   use of rollator with significant deviation from course, SBA.   Ambulating Backwards Cannot walk 20 ft without assistance, severe gait deviations or imbalance, deviates greater than 15 in outside 12 in walkway width or will not attempt task.   with rollator and sigificantly  decreased gait speed   Steps Alternating feet, must use rail.    Total Score 13    FGA comment: <19 indicates high fall risk                PATIENT EDUCATION: Education details: continue HEP as able, progress toward STGs Person educated: Patient and Spouse Education method: Explanation Education comprehension: verbalized understanding and needs further education     HOME EXERCISE PROGRAM: Access Code: [email or web address] URL: [email or web address] Date: 02/05/2022 Prepared by: Estevan Ryder  Exercises - Sit to Stand with Counter Support  - 1 x daily - 7 x weekly - 3 sets - 10 reps - Standing March with Counter Support  - 1 x daily - 7 x weekly - 3 sets - 10 reps - Side Stepping with Counter Support  -  1 x daily - 7 x weekly - 3 sets - 10 reps       GOALS: Goals reviewed with patient? Yes   SHORT TERM GOALS: Target date: 02/23/2022   Pt will be independent with initial HEP for improved functional strength and balance  Baseline: 02/28/22: has program, has been inconsistent past few weeks  Goal status: PARTIALLY MET   2.  Pt will improve TUG to </= 60 secs to demonstrated reduced fall risk Baseline: 02/28/22: 26.72 seconds with rollator, SBA/CGA Goal status: MET   3.  Pt will improve FGA to >/= 13/30 to demonstrate improved balance and reduced fall risk Baseline: 02/28/22: 13/30 scored with rollator today Goal status: MET   4.  Patients gait speed to be assessed and LTG to be added as appropriate  Baseline: .58ms Goal status: MET     LONG TERM GOALS: Target date: 03/16/2022   Pt will be independent with final HEP for improved functional strength and balance  Baseline: to be provided Goal status: INITIAL   2.  Pt will improve TUG to </= 45 secs to demonstrated reduced fall risk Baseline: 02/28/22: 26.72 sec's with rollator Goal status: MET   3.  Pt will improve FGA to >/= 16/30 to demonstrate improved balance and reduced fall risk Baseline: 8/30 Goal status:  INITIAL  4. Pt will improve gait speed to >/= .651m to demonstrate improved community ambulation  Baseline: .02/28/22: 0.66 m/s with rollator  Goal status: MET   ASSESSMENT:   CLINICAL IMPRESSION: Today's skilled session continued to focus on progress toward STGs. Pt has met his TUG and FGA goals. He has partially met his HEP goal as he has one, however has been inconsistent with performance. Pt also met his TUG and 10 meter gait speed LTGs this session. The pt appears to be making progress and should benefit from continued PT to progress toward unmet goals.    OBJECTIVE IMPAIRMENTS Abnormal gait, decreased activity tolerance, decreased balance, decreased cognition, decreased coordination, decreased endurance, decreased knowledge of condition, decreased knowledge of use of DME, decreased safety awareness, impaired perceived functional ability, and impaired vision/preception.    ACTIVITY LIMITATIONS carrying, lifting, bending, standing, squatting, stairs, transfers, bed mobility, and locomotion level   PARTICIPATION LIMITATIONS: cleaning, medication management, interpersonal relationship, driving, shopping, and community activity   PERSONAL FACTORS Age, Behavior pattern, Past/current experiences, Time since onset of injury/illness/exacerbation, and 3+ comorbidities: HTN, CKD, homonymous hemianopsia, cog deficits  are also affecting patient's functional outcome.    REHAB POTENTIAL: Fair time since onset   CLINICAL DECISION MAKING: Evolving/moderate complexity   EVALUATION COMPLEXITY: Moderate   PLAN: PT FREQUENCY: 2x/week   PT DURATION: 6 weeks   PLANNED INTERVENTIONS: Therapeutic exercises, Therapeutic activity, Neuromuscular re-education, Balance training, Gait training, Patient/Family education, Self Care, Joint mobilization, Stair training, Vestibular training, Visual/preceptual remediation/compensation, Orthotic/Fit training, DME instructions, Aquatic Therapy, Cognitive remediation,  Wheelchair mobility training, Manual therapy, and Re-evaluation   PLAN FOR NEXT SESSION:  household distance gait with rollator and turns, step length, foot clearance, balance strategies, weight shift R, gait on compliant surfaces     KaWillow OraPTA, CLNew Vision Surgical Center LLCutpatient Neuro ReTrinity Medical Center West-Er[address]SuFort [place]rLa Mesa[place] [phone]/06/23, 9:47 AM

## 2022-03-02 ENCOUNTER — Ambulatory Visit: Payer: Medicare PPO | Admitting: Speech Pathology

## 2022-03-02 ENCOUNTER — Ambulatory Visit: Payer: Medicare PPO | Admitting: Physical Therapy

## 2022-03-02 ENCOUNTER — Other Ambulatory Visit: Payer: Self-pay

## 2022-03-02 DIAGNOSIS — R278 Other lack of coordination: Secondary | ICD-10-CM | POA: Diagnosis not present

## 2022-03-02 DIAGNOSIS — R41844 Frontal lobe and executive function deficit: Secondary | ICD-10-CM | POA: Diagnosis not present

## 2022-03-02 DIAGNOSIS — R41841 Cognitive communication deficit: Secondary | ICD-10-CM

## 2022-03-02 DIAGNOSIS — R339 Retention of urine, unspecified: Secondary | ICD-10-CM | POA: Diagnosis not present

## 2022-03-02 DIAGNOSIS — R41842 Visuospatial deficit: Secondary | ICD-10-CM | POA: Diagnosis not present

## 2022-03-02 DIAGNOSIS — Z9181 History of falling: Secondary | ICD-10-CM | POA: Diagnosis not present

## 2022-03-02 DIAGNOSIS — R2689 Other abnormalities of gait and mobility: Secondary | ICD-10-CM | POA: Diagnosis not present

## 2022-03-02 DIAGNOSIS — N3289 Other specified disorders of bladder: Secondary | ICD-10-CM | POA: Diagnosis not present

## 2022-03-02 DIAGNOSIS — M6281 Muscle weakness (generalized): Secondary | ICD-10-CM | POA: Diagnosis not present

## 2022-03-02 DIAGNOSIS — R471 Dysarthria and anarthria: Secondary | ICD-10-CM | POA: Diagnosis not present

## 2022-03-02 DIAGNOSIS — N3090 Cystitis, unspecified without hematuria: Secondary | ICD-10-CM | POA: Diagnosis not present

## 2022-03-02 DIAGNOSIS — R2681 Unsteadiness on feet: Secondary | ICD-10-CM | POA: Diagnosis not present

## 2022-03-02 NOTE — Therapy (Addendum)
OUTPATIENT SPEECH LANGUAGE PATHOLOGY EVALUATION   Patient Name: Nicholas Mckenzie MRN: [id number] DOB:1945/07/28, 76 y.o., male Today's Date: 03/02/2022  PCP: Ollen Bowl, MD REFERRING PROVIDER: Micki Riley, MD   End of Session - 03/02/22 0954     Visit Number 1    Number of Visits 25    Date for SLP Re-Evaluation 05/25/22    Authorization Type [employer]    Progress Note Due on Visit 10    SLP Start Time [phone]    SLP Stop Time  0930    SLP Time Calculation (min) 47 min    Activity Tolerance Patient tolerated treatment well             Past Medical History:  Diagnosis Date   CKD (chronic kidney disease) stage 3, GFR 30-59 ml/min (HCC)    Hypertension    Kidney stones    Sleep apnea    Past Surgical History:  Procedure Laterality Date   BLADDER STONE REMOVAL  12/2021   CATARACT EXTRACTION     IR NEPHROSTOMY EXCHANGE RIGHT  06/07/2021   IR NEPHROSTOMY EXCHANGE RIGHT  08/22/2021   IR NEPHROSTOMY PLACEMENT RIGHT  04/07/2021   IR PATIENT EVAL TECH 0-60 MINS  06/02/2021   kidney stent     MANDIBLE FRACTURE SURGERY     NEPHRECTOMY Right 01/07/2022   Patient Active Problem List   Diagnosis Date Noted   Hypernatremia 08/23/2021   Malnutrition of moderate degree 08/17/2021   Acute respiratory failure with hypoxia (HCC) 08/16/2021   Severe sepsis (HCC) 08/16/2021   Sore throat 08/16/2021   Hx of subdural hematoma 08/16/2021   Nephrolithiasis 08/16/2021   Stage 3a chronic kidney disease (CKD) (HCC) 08/16/2021   Obesity (BMI 30-39.9) 08/16/2021   OSA (obstructive sleep apnea) 08/16/2021   Aspiration pneumonia (HCC) 08/15/2021   Snoring 08/08/2021   Frequent urination 06/07/2021   Acute lower UTI 06/07/2021   Ascending aortic aneurysm (HCC) 06/07/2021   Atrophy of right kidney 06/07/2021   Anemia 06/07/2021   Obesity (BMI 30.0-34.9) 06/07/2021   Subdural hematoma (HCC) 06/06/2021   COVID-19 virus infection 06/06/2021   Hypokalemia 06/06/2021   H/O ischemic  right PCA stroke 06/06/2021   History of cardioembolic cerebrovascular accident (CVA) 06/06/2021   Right-sided cerebrovascular accident (CVA) (HCC) 04/14/2021   Nonfunctioning kidney 04/14/2021   Goals of care, counseling/discussion 04/03/2021   Ureteral stent occlusion (HCC) 04/02/2021   CKD (chronic kidney disease) stage 3a 03/11/2021   Major depressive disorder, recurrent episode, in partial remission (HCC) 03/11/2021   Hypertension    Sleep apnea    GAD (generalized anxiety disorder) 07/20/2019    ONSET DATE: referral date 02/03/2022  REFERRING DIAG: R41.89 (ICD-10-CM) - Cognitive change  THERAPY DIAG:  Cognitive communication deficit  Rationale for Evaluation and Treatment Rehabilitation  SUBJECTIVE:   SUBJECTIVE STATEMENT: "It's been stressful"  Pt accompanied by: significant other  PERTINENT HISTORY: 76 year old male with 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 Pt with prior OP ST course, ended d/t pt having surgery to remove kidney and kidney stones. Returning to resume cognitive rehabilitation.   PAIN:  Are you having pain? No   FALLS: Has patient fallen in last 6 months?  Yes, Comment: seeing PT  LIVING ENVIRONMENT: Lives with: lives with their spouse Lives in: House/apartment  PLOF:  Level of assistance: Needed assistance with ADLs, Needed assistance with IADLS Employment: Retired  PATIENT GOALS "be more active at home"  OBJECTIVE:   COGNITION: Overall cognitive status: Impaired Areas of impairment:  Attention: Impaired: Sustained, Selective, Alternating, Divided Memory: Impaired: Working Industrial/product designer term Prospective Awareness: Impaired: Intellectual and Anticipatory Executive function: Impaired: Initiation and Planning Behavior: Impulsive Functional deficits: [name] is managing all of Decklin's appointments,  medications, and providing A with ADL's. Is not participating in home based activites such as household tasks, cleaning up after self, etc. Impaired awareness and executive fx skills resulting in recent fall in which pt sustained injuries to face.    STANDARDIZED ASSESSMENTS: Deferred d/t CLQT initiated May 2023 demonstrating deficits. Focused evaluation on motivational interviewing to best assess pt and spouses functional needs.   PATIENT REPORTED OUTCOME MEASURES (PROM): To be administered 1st therapy session: self anchored rating scale for household participation   TODAY'S TREATMENT:  Education provided on evaluation results and SLP recommendations. Generated person centered goals through collaborative effort with patient and spouse. All questions answered to satisfaction; spouse and patient verbalize agreement with POC.   Trial stimulability for meta-cognitive strategy- Goal Management Training- using prior incidence of fall. Given mod verbal cues, pt able to ID problem and generate x2 potential solutions of hiring someone to complete task or moving tools to place not requiring reach while standing.    PATIENT EDUCATION: Education details: see above Person educated: Patient and Spouse Education method: Explanation, Demonstration, Verbal cues, and Handouts Education comprehension: verbalized understanding, returned demonstration, and needs further education     GOALS: Goals reviewed with patient? Yes  SHORT TERM GOALS: Target date: 03/30/2022  Pt will use external aids to recall appointments with occasional min A from spouse over 1 week Baseline: Goal status: INITIAL  2.  Pt will use to do list or other memory aid to complete 2 household chores 5/7 days a week with occasional min A from spouse Baseline:  Goal status: INITIAL  3.  Pt will verbalize 3 safety concerns re: mobility and cognition and generate appropriate modification using trained meta-cognitive strategy with rare  min A Baseline:  Goal status: INITIAL  4.  Pt will implement external aid to facilitate HEP completion per therapist recommendations over 1 week period with occasional min-A from spouse Baseline:  Goal status: INITIAL  5.  Pt will generate external aids (e.g. medication chart) to facilitate increased independence in medication management with usual mod-A  Baseline:  Goal status: INITIAL   LONG TERM GOALS: Target date: 05/25/2022  Pt will accurately sort medications into pill box with mod-I  Baseline:  Goal status: INITIAL  2.  Pt will implement AM medication management routine resulting in adherence to medication administration over 1 week period with rare min-A from spouse Baseline:  Goal status: INITIAL  3.  Pt will complete 3 house hold chores 5/7 days with to do list or other external aid Baseline:  Goal status: INITIAL  4.  Pt and spouse will report improved participation in household responsibilities using self-anchored rating scale Baseline:  Goal status: INITIAL  ASSESSMENT:  CLINICAL IMPRESSION: Patient is a 76 y.o. M who was seen today for cognitive assessment for ongoing change in cognitive functioning s/p TIA Nove 2022. Pt presenting with mild to moderate impairment in areas of attention, memory, executive functioning, resulting in decreased participation in household management, preferred avocational activities and self-care. Currently spouse is managing household and providing A for all ADLs. Even with A, breakdowns reported in medication and schedule management resulting in missed doses and appointments. Reduced QoL d/t limited participation  in desired activities. I recommend skilled ST to maximize cognition and train in compensatory strategies for cognition for safety, to reduce caregiver burden and to improve pt's QoL.   OBJECTIVE IMPAIRMENTS include attention, memory, awareness, and executive functioning. These impairments are limiting patient from managing  medications, managing appointments, managing finances, household responsibilities, and ADLs/IADLs. Factors affecting potential to achieve goals and functional outcome are ability to learn/carryover information, cooperation/participation level, and previous level of function.. Patient will benefit from skilled SLP services to address above impairments and improve overall function.  REHAB POTENTIAL: Good  PLAN: SLP FREQUENCY: 2x/week  SLP DURATION: 12 weeks  PLANNED INTERVENTIONS: Cueing hierachy, Cognitive reorganization, Internal/external aids, Functional tasks, SLP instruction and feedback, Compensatory strategies, and Patient/family education    Maia Breslow, CCC-SLP 03/02/2022, 9:56 AM

## 2022-03-03 DIAGNOSIS — R339 Retention of urine, unspecified: Secondary | ICD-10-CM | POA: Diagnosis not present

## 2022-03-03 DIAGNOSIS — N3289 Other specified disorders of bladder: Secondary | ICD-10-CM | POA: Diagnosis not present

## 2022-03-05 ENCOUNTER — Ambulatory Visit: Payer: Medicare PPO

## 2022-03-05 ENCOUNTER — Encounter: Payer: Medicare PPO | Admitting: Occupational Therapy

## 2022-03-07 ENCOUNTER — Encounter: Payer: Medicare PPO | Admitting: Occupational Therapy

## 2022-03-07 ENCOUNTER — Ambulatory Visit: Payer: Medicare PPO
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
## 2022-03-12 ENCOUNTER — Ambulatory Visit: Payer: Medicare PPO | Admitting: Occupational Therapy

## 2022-03-12 ENCOUNTER — Ambulatory Visit: Payer: Medicare PPO | Admitting: Speech Pathology

## 2022-03-12 ENCOUNTER — Encounter: Payer: Self-pay | Admitting: Occupational Therapy

## 2022-03-12 ENCOUNTER — Ambulatory Visit: Payer: Medicare PPO | Admitting: Physical Therapy

## 2022-03-12 VITALS — BP 155/96 | HR 75

## 2022-03-12 DIAGNOSIS — R2681 Unsteadiness on feet: Secondary | ICD-10-CM

## 2022-03-12 DIAGNOSIS — Z9181 History of falling: Secondary | ICD-10-CM

## 2022-03-12 DIAGNOSIS — R41842 Visuospatial deficit: Secondary | ICD-10-CM

## 2022-03-12 DIAGNOSIS — R41840 Attention and concentration deficit: Secondary | ICD-10-CM

## 2022-03-12 DIAGNOSIS — R41844 Frontal lobe and executive function deficit: Secondary | ICD-10-CM | POA: Diagnosis not present

## 2022-03-12 DIAGNOSIS — R2689 Other abnormalities of gait and mobility: Secondary | ICD-10-CM | POA: Diagnosis not present

## 2022-03-12 DIAGNOSIS — R41841 Cognitive communication deficit: Secondary | ICD-10-CM | POA: Diagnosis not present

## 2022-03-12 DIAGNOSIS — R471 Dysarthria and anarthria: Secondary | ICD-10-CM

## 2022-03-12 DIAGNOSIS — M6281 Muscle weakness (generalized): Secondary | ICD-10-CM

## 2022-03-12 DIAGNOSIS — R278 Other lack of coordination: Secondary | ICD-10-CM

## 2022-03-12 NOTE — Patient Instructions (Signed)
Our small goal re: improved household participation is you cleaning up after yourself in the bathroom.  You have committed to: Putting tissues in the trash Making sure toilet is flushed Putting clothes in laundry room Putting toiletries and cord into drawer

## 2022-03-12 NOTE — Therapy (Signed)
OUTPATIENT PHYSICAL THERAPY TREATMENT NOTE   Patient Name: Nicholas Mckenzie MRN: [id number] DOB:[birth year], 76 y.o., male Today's Date: 03/12/2022  PCP: Early Osmond, MD REFERRING PROVIDER: [name], MD   END OF SESSION:   PT End of Session - 03/12/22 0852     Visit Number 7    Number of Visits 13    Date for PT Re-Evaluation 03/16/22    Authorization Type [employer]    PT Start Time 1779   coming from OT session   PT Stop Time 0930    PT Time Calculation (min) 40 min    Equipment Utilized During Treatment Gait belt    Activity Tolerance Patient tolerated treatment well    Behavior During Therapy [facility] for tasks assessed/performed;Impulsive              Past Medical History:  Diagnosis Date   CKD (chronic kidney disease) stage 3, GFR 30-59 ml/min (HCC)    Hypertension    Kidney stones    Sleep apnea    Past Surgical History:  Procedure Laterality Date   BLADDER STONE REMOVAL  12/2021   CATARACT EXTRACTION     IR NEPHROSTOMY EXCHANGE RIGHT  06/07/2021   IR NEPHROSTOMY EXCHANGE RIGHT  08/22/2021   IR NEPHROSTOMY PLACEMENT RIGHT  04/07/2021   IR PATIENT EVAL TECH 0-60 MINS  06/02/2021   kidney stent     MANDIBLE FRACTURE SURGERY     NEPHRECTOMY Right 01/07/2022   Patient Active Problem List   Diagnosis Date Noted   Hypernatremia 08/23/2021   Malnutrition of moderate degree 08/17/2021   Acute respiratory failure with hypoxia (HCC) 08/16/2021   Severe sepsis ([place]) 08/16/2021   Sore throat 08/16/2021   Hx of subdural hematoma 08/16/2021   Nephrolithiasis 08/16/2021   Stage 3a chronic kidney disease (CKD) ([place]) 08/16/2021   Obesity (BMI 30-39.9) 08/16/2021   OSA (obstructive sleep apnea) 08/16/2021   Aspiration pneumonia ([place]) 08/15/2021   Snoring 08/08/2021   Frequent urination 06/07/2021   Acute lower UTI 06/07/2021   Ascending aortic aneurysm ([place]) 06/07/2021   Atrophy of right kidney 06/07/2021   Anemia 06/07/2021   Obesity (BMI 30.0-34.9)  06/07/2021   Subdural hematoma ([place]) 06/06/2021   COVID-19 virus infection 06/06/2021   Hypokalemia 06/06/2021   H/O ischemic right PCA stroke 06/06/2021   History of cardioembolic cerebrovascular accident (CVA) 06/06/2021   Right-sided cerebrovascular accident (CVA) ([place]) 04/14/2021   Nonfunctioning kidney 04/14/2021   Goals of care, counseling/discussion 04/03/2021   Ureteral stent occlusion ([place]) 04/02/2021   CKD (chronic kidney disease) stage 3a 03/11/2021   Major depressive disorder, recurrent episode, in partial remission ([place]) 03/11/2021   Hypertension    Sleep apnea    GAD (generalized anxiety disorder) 07/20/2019    REFERRING DIAG: R26.0 (ICD-10-CM) - Ataxic gait  THERAPY DIAG:  Unsteadiness on feet  Muscle weakness (generalized)  History of falling  Rationale for Evaluation and Treatment Rehabilitation  PERTINENT HISTORY: acute/subacute R PCA stroke kidney CKD, hypertension, prior CVA   PRECAUTIONS: fall- check BP  SUBJECTIVE: Pt returning from trip to [facility], did a lot of walking while on the trip and R knee is a bit sore. Pt feels that he leans quite a bit to the R and putting a lot of pressure on R arm, per wife pt ambulates while pushing RW far away from him, working in staying closer to [employer]. Some soreness in R knee and in neck this morning. Pt has been unable to work in his HEP due  to being on vacation.  PAIN:  Are you having pain? No, just soreness in R knee and neck  VITALS:  Vitals:   03/12/22 0857  BP: (!) 155/96  Pulse: 75      TODAY'S TREATMENT: THER EX: SciFit level 2 for 8 minutes using BUE/BLEs for neural priming for reciprocal movement, dynamic cardiovascular warmup and increased amplitude of stepping. RPE of 9/10 following activity.  THER ACT: Static standing balance with 1" step under RLE to encourage weight shift to the L while performing ball toss, transition to dual tasking naming animals while performing ball toss. Pt exhibits difficulty in  maintaining pace of exercise and needs increased time to recall names of animals. Progression to static stance on 4" step under RLE while performing unweighted ball punch-outs due to decrease in overall balance/stability in this position, unable to perform ball toss with use of 4" step this session.    PATIENT EDUCATION: Education details: continue HEP as able, wear R knee brace, use heat or ice for pain management Person educated: Patient and Spouse Education method: Explanation Education comprehension: verbalized understanding and needs further education     HOME EXERCISE PROGRAM: Access Code: [email or web address] URL: [email or web address] Date: 02/05/2022 Prepared by: Estevan Ryder  Exercises - Sit to Stand with Counter Support  - 1 x daily - 7 x weekly - 3 sets - 10 reps - Standing March with Counter Support  - 1 x daily - 7 x weekly - 3 sets - 10 reps - Side Stepping with Counter Support  - 1 x daily - 7 x weekly - 3 sets - 10 reps       GOALS: Goals reviewed with patient? Yes   SHORT TERM GOALS: Target date: 02/23/2022   Pt will be independent with initial HEP for improved functional strength and balance  Baseline: 02/28/22: has program, has been inconsistent past few weeks  Goal status: PARTIALLY MET   2.  Pt will improve TUG to </= 60 secs to demonstrated reduced fall risk Baseline: 02/28/22: 26.72 seconds with rollator, SBA/CGA Goal status: MET   3.  Pt will improve FGA to >/= 13/30 to demonstrate improved balance and reduced fall risk Baseline: 02/28/22: 13/30 scored with rollator today Goal status: MET   4.  Patients gait speed to be assessed and LTG to be added as appropriate  Baseline: .74ms Goal status: MET     LONG TERM GOALS: Target date: 03/16/2022   Pt will be independent with final HEP for improved functional strength and balance  Baseline: to be provided Goal status: INITIAL   2.  Pt will improve TUG to </= 45 secs to demonstrated reduced fall  risk Baseline: 02/28/22: 26.72 sec's with rollator Goal status: MET   3.  Pt will improve FGA to >/= 16/30 to demonstrate improved balance and reduced fall risk Baseline: 8/30 Goal status: INITIAL  4. Pt will improve gait speed to >/= .653m to demonstrate improved community ambulation  Baseline: .02/28/22: 0.66 m/s with rollator  Goal status: MET   ASSESSMENT:   CLINICAL IMPRESSION: Emphasis of skilled PT session on continuing to work on global endurance, safety with use of rollator, and increasing weight shift to the L in standing. Pt continues to require verbal cues for safe rollator use during transfers and gait. Educated pt he can wear his knee brace for knee soreness with activity or use heat/ice depending on what feels better for his pain management. Continue POC.    OBJECTIVE IMPAIRMENTS Abnormal gait, decreased activity  tolerance, decreased balance, decreased cognition, decreased coordination, decreased endurance, decreased knowledge of condition, decreased knowledge of use of DME, decreased safety awareness, impaired perceived functional ability, and impaired vision/preception.    ACTIVITY LIMITATIONS carrying, lifting, bending, standing, squatting, stairs, transfers, bed mobility, and locomotion level   PARTICIPATION LIMITATIONS: cleaning, medication management, interpersonal relationship, driving, shopping, and community activity   PERSONAL FACTORS Age, Behavior pattern, Past/current experiences, Time since onset of injury/illness/exacerbation, and 3+ comorbidities: HTN, CKD, homonymous hemianopsia, cog deficits  are also affecting patient's functional outcome.    REHAB POTENTIAL: Fair time since onset   CLINICAL DECISION MAKING: Evolving/moderate complexity   EVALUATION COMPLEXITY: Moderate   PLAN: PT FREQUENCY: 2x/week   PT DURATION: 6 weeks   PLANNED INTERVENTIONS: Therapeutic exercises, Therapeutic activity, Neuromuscular re-education, Balance training, Gait training,  Patient/Family education, Self Care, Joint mobilization, Stair training, Vestibular training, Visual/preceptual remediation/compensation, Orthotic/Fit training, DME instructions, Aquatic Therapy, Cognitive remediation, Wheelchair mobility training, Manual therapy, and Re-evaluation   PLAN FOR NEXT SESSION:  household distance gait with rollator and turns, step length, foot clearance, balance strategies, weight shift R, gait on compliant surfaces    Excell Seltzer, PT, DPT, [place] [address], [place], [place] [address] [phone] 03/12/22, 9:30 AM

## 2022-03-12 NOTE — Patient Instructions (Signed)
Local Driver Evaluation Programs: ° °Comprehensive Evaluation: includes clinical and in vehicle behind the wheel testing by OCCUPATIONAL THERAPIST. Programs have varying levels of adaptive controls available for trial.  ° °[facility] °[address] °[place], [place]  [address] °[phone] or [phone] °http://www.driver-rehab.com °Evaluator:  Cyndee Crompton, OT/CDRS/CDI/SCDCM/Low Vision Certification ° °[facility] °[address] °[place], [place] [address] °[phone] °[email or web address]://[email or web address]/services/[email or web address] °Evaluators:  Shannon Sheek, OT and Jill Tucker, OT ° °[facility] [place] [facility] (ONLY SERVES VETERANS!!) °[facility] °[address] °[place], [place]  [address] °[phone] °http://www.[place].gov/services/Physical_Medicine_Rehabilitation_Services.asp °Evaluators:  Eric Andrews, KT; Heidi Harris, KT;  Gary Whitaker, KT (KT=kiniesotherapist) ° ° °Clinical evaluations only:  Includes clinical testing, refers to other programs or local certified driving instructor for behind the wheel testing. ° °[facility] (outpatient Rehab) °[facility] °[address] °[place], [place] [address] °[phone] for scheduling °[email or web address]-[email or web address] °Evaluators:  Kelly Lambeth, OT; Kate Phillips, OT ° °Other area clinical evaluators available upon request including [facility], [facility] and [facility]. ° ° °    Resource List °What is a Driver Evaluation: °Your Road Ahead - A Guide to Comprehensive Driving Evaluations °http://www.thehartford.com/resources/mature-market-excellence/publications-on-aging ° °Association for [employer] Services - Disability and Driving Fact Sheets °http://www.aded.net/?page=510 ° °Driving after a Brain  Injury: °[employer] °http://www.biausa.org/tbims-abstracts/if-there-is-an-effective-way-to-determine-if-someone-is-ready-to-drive-after-tbi?A=SearchResult&SearchID=9495675&ObjectID=2758842&ObjectType=35 ° °Driving with Adaptive Equipment: °[facility] Process °http://www.driver-rehab.com/adaptive-equipment ° °[employer] °http://www.nmeda.com/ ° ° ° ° ° ° °  °

## 2022-03-12 NOTE — Therapy (Signed)
OUTPATIENT SPEECH LANGUAGE PATHOLOGY TREATMENT   Patient Name: Nicholas Mckenzie MRN: [id number] DOB:[birth year], 76 y.o., male Today's Date: 03/12/2022  PCP: [name], MD REFERRING PROVIDER: [name], MD   End of Session - 03/12/22 1024     Visit Number 2    Number of Visits 25    Date for SLP Re-Evaluation 05/25/22    Authorization Type [employer]    Progress Note Due on Visit 10    SLP Start Time 1015    SLP Stop Time  1100    SLP Time Calculation (min) 45 min    Activity Tolerance Patient tolerated treatment well              Past Medical History:  Diagnosis Date   CKD (chronic kidney disease) stage 3, GFR 30-59 ml/min ([place])    Hypertension    Kidney stones    Sleep apnea    Past Surgical History:  Procedure Laterality Date   BLADDER STONE REMOVAL  12/2021   CATARACT EXTRACTION     IR NEPHROSTOMY EXCHANGE RIGHT  06/07/2021   IR NEPHROSTOMY EXCHANGE RIGHT  08/22/2021   IR NEPHROSTOMY PLACEMENT RIGHT  04/07/2021   IR PATIENT EVAL TECH 0-60 MINS  06/02/2021   kidney stent     MANDIBLE FRACTURE SURGERY     NEPHRECTOMY Right 01/07/2022   Patient Active Problem List   Diagnosis Date Noted   Hypernatremia 08/23/2021   Malnutrition of moderate degree 08/17/2021   Acute respiratory failure with hypoxia ([place]) 08/16/2021   Severe sepsis ([place]) 08/16/2021   Sore throat 08/16/2021   Hx of subdural hematoma 08/16/2021   Nephrolithiasis 08/16/2021   Stage 3a chronic kidney disease (CKD) ([place]) 08/16/2021   Obesity (BMI 30-39.9) 08/16/2021   OSA (obstructive sleep apnea) 08/16/2021   Aspiration pneumonia ([place]) 08/15/2021   Snoring 08/08/2021   Frequent urination 06/07/2021   Acute lower UTI 06/07/2021   Ascending aortic aneurysm ([place]) 06/07/2021   Atrophy of right kidney 06/07/2021   Anemia 06/07/2021   Obesity (BMI 30.0-34.9) 06/07/2021   Subdural hematoma ([place]) 06/06/2021   COVID-19 virus infection 06/06/2021   Hypokalemia 06/06/2021   H/O  ischemic right PCA stroke 06/06/2021   History of cardioembolic cerebrovascular accident (CVA) 06/06/2021   Right-sided cerebrovascular accident (CVA) ([place]) 04/14/2021   Nonfunctioning kidney 04/14/2021   Goals of care, counseling/discussion 04/03/2021   Ureteral stent occlusion ([place]) 04/02/2021   CKD (chronic kidney disease) stage 3a 03/11/2021   Major depressive disorder, recurrent episode, in partial remission ([place]) 03/11/2021   Hypertension    Sleep apnea    GAD (generalized anxiety disorder) 07/20/2019    ONSET DATE: referral date 02/03/2022  REFERRING DIAG: R41.89 (ICD-10-CM) - Cognitive change  THERAPY DIAG:  Cognitive communication deficit  Dysarthria and anarthria  Rationale for Evaluation and Treatment Rehabilitation  SUBJECTIVE:   SUBJECTIVE STATEMENT: "Getting on the plane was challenging"   PAIN:  Are you having pain? No  OBJECTIVE:   TODAY'S TREATMENT:  03-12-22: Use of self-anchored rating scale to generate pt centered goal re: increased household participation. Established small goal of cleaning up after self in bathroom. Pt able to ID x4 actions which would benefit accomplishment of this small goal. SLP generated visual aid to facilitate success in completing daily tasks re: bathroom clean up, anchored to bright pink paper. Spouse will take to mirror. SLP advises that spouse may need to move around every few days to encourage visual attention to stimuli. SLP provided coaching on cueing strategies  spouse can use to aid pt in establishing habits for long term successful employment of clean up activities.   03-02-22: Education provided on evaluation results and SLP recommendations. Generated person centered goals through collaborative effort with patient and spouse. All questions answered to satisfaction; spouse and patient verbalize agreement with POC.   Trial stimulability for meta-cognitive strategy- Goal Management Training- using prior incidence of fall. Given  mod verbal cues, pt able to ID problem and generate x2 potential solutions of hiring someone to complete task or moving tools to place not requiring reach while standing.    PATIENT EDUCATION: Education details: see above Person educated: Patient and Spouse Education method: Explanation, Demonstration, Verbal cues, and Handouts Education comprehension: verbalized understanding, returned demonstration, and needs further education   GOALS: Goals reviewed with patient? Yes  SHORT TERM GOALS: Target date: 03/30/2022  Pt will use external aids to recall appointments with occasional min A from spouse over 1 week Baseline: Goal status: IN PROGRESS  2.  Pt will use to do list or other memory aid to complete 2 household chores 5/7 days a week with occasional min A from spouse Baseline:  Goal status: IN PROGRESS  3.  Pt will verbalize 3 safety concerns re: mobility and cognition and generate appropriate modification using trained meta-cognitive strategy with rare min A Baseline:  Goal status: IN PROGRESS  4.  Pt will implement external aid to facilitate HEP completion per therapist recommendations over 1 week period with occasional min-A from spouse Baseline:  Goal status: IN PROGRESS  5.  Pt will generate external aids (e.g. medication chart) to facilitate increased independence in medication management with usual mod-A  Baseline:  Goal status: IN PROGRESS   LONG TERM GOALS: Target date: 05/25/2022  Pt will accurately sort medications into pill box with mod-I  Baseline:  Goal status: IN PROGRESS  2.  Pt will implement AM medication management routine resulting in adherence to medication administration over 1 week period with rare min-A from spouse Baseline:  Goal status: IN PROGRESS  3.  Pt will complete 3 house hold chores 5/7 days with to do list or other external aid Baseline:  Goal status: IN PROGRESS  4.  Pt and spouse will report improved participation in household  responsibilities using self-anchored rating scale Baseline:  Goal status: IN PROGRESS  ASSESSMENT:  CLINICAL IMPRESSION: Patient is a 76 y.o. M who was seen today for cognitive assessment for ongoing change in cognitive functioning s/p TIA Nove 2022. Pt presenting with mild to moderate impairment in areas of attention, memory, executive functioning, resulting in decreased participation in household management, preferred avocational activities and self-care. Currently spouse is managing household and providing A for all ADLs. Even with A, breakdowns reported in medication and schedule management resulting in missed doses and appointments. Reduced QoL d/t limited participation in desired activities. I recommend skilled ST to maximize cognition and train in compensatory strategies for cognition for safety, to reduce caregiver burden and to improve pt's QoL.   OBJECTIVE IMPAIRMENTS include attention, memory, awareness, and executive functioning. These impairments are limiting patient from managing medications, managing appointments, managing finances, household responsibilities, and ADLs/IADLs. Factors affecting potential to achieve goals and functional outcome are ability to learn/carryover information, cooperation/participation level, and previous level of function.. Patient will benefit from skilled SLP services to address above impairments and improve overall function.  REHAB POTENTIAL: Good  PLAN: SLP FREQUENCY: 2x/week  SLP DURATION: 12 weeks  PLANNED INTERVENTIONS: Cueing hierachy, Cognitive reorganization, Internal/external aids, Functional tasks,  SLP instruction and feedback, Compensatory strategies, and Patient/family education    Su Monks, [place] 03/12/2022, 10:25 AM

## 2022-03-14 ENCOUNTER — Ambulatory Visit: Payer: Medicare PPO | Admitting: Speech Pathology

## 2022-03-14 ENCOUNTER — Ambulatory Visit: Payer: Medicare PPO | Admitting: Occupational Therapy

## 2022-03-14 ENCOUNTER — Ambulatory Visit: Payer: Medicare PPO

## 2022-03-14 DIAGNOSIS — M6281 Muscle weakness (generalized): Secondary | ICD-10-CM | POA: Diagnosis not present

## 2022-03-14 DIAGNOSIS — R278 Other lack of coordination: Secondary | ICD-10-CM | POA: Diagnosis not present

## 2022-03-14 DIAGNOSIS — R2689 Other abnormalities of gait and mobility: Secondary | ICD-10-CM

## 2022-03-14 DIAGNOSIS — Z9181 History of falling: Secondary | ICD-10-CM | POA: Diagnosis not present

## 2022-03-14 DIAGNOSIS — R2681 Unsteadiness on feet: Secondary | ICD-10-CM | POA: Diagnosis not present

## 2022-03-14 DIAGNOSIS — R41842 Visuospatial deficit: Secondary | ICD-10-CM | POA: Diagnosis not present

## 2022-03-14 DIAGNOSIS — R41841 Cognitive communication deficit: Secondary | ICD-10-CM | POA: Diagnosis not present

## 2022-03-14 DIAGNOSIS — R471 Dysarthria and anarthria: Secondary | ICD-10-CM | POA: Diagnosis not present

## 2022-03-14 DIAGNOSIS — R41844 Frontal lobe and executive function deficit: Secondary | ICD-10-CM | POA: Diagnosis not present

## 2022-03-14 NOTE — Patient Instructions (Signed)
1. Look for the edge of objects (to the left and/or right) so that you make sure you are seeing all of an object 2. Turn your head when walking, scan from side to side, particularly in busy environments 3. Use an organized scanning pattern. It's usually easier to scan from top to bottom, and left to right (like you are reading) 4. Double check yourself 5. Use a line guide (like a blank piece of paper) or your finger when reading 6. If necessary, place brightly colored tape at end of table or work area as a reminder to always look until you see the tape.  Activities to try at home to encourage visual scanning:   1. Word searches 2. Mazes 3. Puzzles 4. Card games 5. Computer games and/or searches 6. Connect-the-dots  Activities for environmental (larger) scanning:  1. With supervision, scan for items in grocery store or drugstore.  Begin with a familiar store, then progress to a new store you've never been in before. Make sure you have supervision with this.  2. With supervision, tell a family member or caregiver when it is safe to cross a street after looking all directions and any side streets. However, do NOT cross street unless family member or caregiver is with you and says it is OK    

## 2022-03-14 NOTE — Patient Instructions (Addendum)
Medication Management:  Keep using the deeper dish that Nicholas Mckenzie can pick his pills out of 3 at time.    Routine is key!  Do blood pressure meds + drink in kitchen next to fridge Fridge supplements-- take all 5 out, put back in 1 by 1 as you take them

## 2022-03-14 NOTE — Therapy (Signed)
OUTPATIENT SPEECH LANGUAGE PATHOLOGY TREATMENT   Patient Name: Nicholas Mckenzie MRN: [id number] DOB:1946-05-19, 76 y.o., male Today's Date: 03/14/2022  PCP: [name], MD REFERRING PROVIDER: [name], MD   End of Session - 03/14/22 0836     Visit Number 3    Number of Visits 25    Date for SLP Re-Evaluation 05/25/22    Authorization Type [employer]    Progress Note Due on Visit 10    SLP Start Time 0845    SLP Stop Time  0930    SLP Time Calculation (min) 45 min    Activity Tolerance Patient tolerated treatment well              Past Medical History:  Diagnosis Date   CKD (chronic kidney disease) stage 3, GFR 30-59 ml/min ([place])    Hypertension    Kidney stones    Sleep apnea    Past Surgical History:  Procedure Laterality Date   BLADDER STONE REMOVAL  12/2021   CATARACT EXTRACTION     IR NEPHROSTOMY EXCHANGE RIGHT  06/07/2021   IR NEPHROSTOMY EXCHANGE RIGHT  08/22/2021   IR NEPHROSTOMY PLACEMENT RIGHT  04/07/2021   IR PATIENT EVAL TECH 0-60 MINS  06/02/2021   kidney stent     MANDIBLE FRACTURE SURGERY     NEPHRECTOMY Right 01/07/2022   Patient Active Problem List   Diagnosis Date Noted   Hypernatremia 08/23/2021   Malnutrition of moderate degree 08/17/2021   Acute respiratory failure with hypoxia ([facility]) 08/16/2021   Severe sepsis ([place]) 08/16/2021   Sore throat 08/16/2021   Hx of subdural hematoma 08/16/2021   Nephrolithiasis 08/16/2021   Stage 3a chronic kidney disease (CKD) ([place]) 08/16/2021   Obesity (BMI 30-39.9) 08/16/2021   OSA (obstructive sleep apnea) 08/16/2021   Aspiration pneumonia ([place]) 08/15/2021   Snoring 08/08/2021   Frequent urination 06/07/2021   Acute lower UTI 06/07/2021   Ascending aortic aneurysm ([place]) 06/07/2021   Atrophy of right kidney 06/07/2021   Anemia 06/07/2021   Obesity (BMI 30.0-34.9) 06/07/2021   Subdural hematoma ([place]) 06/06/2021   COVID-19 virus infection 06/06/2021   Hypokalemia 06/06/2021   H/O  ischemic right PCA stroke 06/06/2021   History of cardioembolic cerebrovascular accident (CVA) 06/06/2021   Right-sided cerebrovascular accident (CVA) ([place]) 04/14/2021   Nonfunctioning kidney 04/14/2021   Goals of care, counseling/discussion 04/03/2021   Ureteral stent occlusion ([place]) 04/02/2021   CKD (chronic kidney disease) stage 3a 03/11/2021   Major depressive disorder, recurrent episode, in partial remission ([place]) 03/11/2021   Hypertension    Sleep apnea    GAD (generalized anxiety disorder) 07/20/2019    ONSET DATE: referral date 02/03/2022  REFERRING DIAG: R41.89 (ICD-10-CM) - Cognitive change  THERAPY DIAG:  Cognitive communication deficit  Rationale for Evaluation and Treatment Rehabilitation  SUBJECTIVE:   SUBJECTIVE STATEMENT: "I'm a little tired"   PAIN:  Are you having pain? No  OBJECTIVE:   TODAY'S TREATMENT:  03-14-22: [place] able to recall goal for bathroom clean up last session. [name] hung Engineer, mining. SLP provided education on cueing heirarchy for spouse to aid in increased independence for recall of desired tasks. [place] able to relay vague details about medication administration regimen. Supplemental information from [name] required for SLP to understand routine. Generated alternative strategy to facilitate successful increased independence with pt taking fridge based supplements without [name] having to deliver, see handout. Pt able to verbalize new routine with mod-I following 5 minute delay. Education on need to establish  routine with support initially, and how to fade that support for increased independence. Pt and spouse verbalize understanding.   03-12-22: Use of self-anchored rating scale to generate pt centered goal re: increased household participation. Established small goal of cleaning up after self in bathroom. Pt able to ID x4 actions which would benefit accomplishment of this small goal. SLP generated visual aid to facilitate success in completing  daily tasks re: bathroom clean up, anchored to bright pink paper. Spouse will take to mirror. SLP advises that spouse may need to move around every few days to encourage visual attention to stimuli. SLP provided coaching on cueing strategies spouse can use to aid pt in establishing habits for long term successful employment of clean up activities.   03-02-22: Education provided on evaluation results and SLP recommendations. Generated person centered goals through collaborative effort with patient and spouse. All questions answered to satisfaction; spouse and patient verbalize agreement with POC.   Trial stimulability for meta-cognitive strategy- Goal Management Training- using prior incidence of fall. Given mod verbal cues, pt able to ID problem and generate x2 potential solutions of hiring someone to complete task or moving tools to place not requiring reach while standing.    PATIENT EDUCATION: Education details: see above Person educated: Patient and Spouse Education method: Explanation, Demonstration, Verbal cues, and Handouts Education comprehension: verbalized understanding, returned demonstration, and needs further education   GOALS: Goals reviewed with patient? Yes  SHORT TERM GOALS: Target date: 03/30/2022  Pt will use external aids to recall appointments with occasional min A from spouse over 1 week Baseline: Goal status: IN PROGRESS  2.  Pt will use to do list or other memory aid to complete 2 household chores 5/7 days a week with occasional min A from spouse Baseline:  Goal status: IN PROGRESS  3.  Pt will verbalize 3 safety concerns re: mobility and cognition and generate appropriate modification using trained meta-cognitive strategy with rare min A Baseline:  Goal status: IN PROGRESS  4.  Pt will implement external aid to facilitate HEP completion per therapist recommendations over 1 week period with occasional min-A from spouse Baseline:  Goal status: IN PROGRESS  5.   Pt will generate external aids (e.g. medication chart) to facilitate increased independence in medication management with usual mod-A  Baseline:  Goal status: IN PROGRESS   LONG TERM GOALS: Target date: 05/25/2022  Pt will accurately sort medications into pill box with mod-I  Baseline:  Goal status: IN PROGRESS  2.  Pt will implement AM medication management routine resulting in adherence to medication administration over 1 week period with rare min-A from spouse Baseline:  Goal status: IN PROGRESS  3.  Pt will complete 3 house hold chores 5/7 days with to do list or other external aid Baseline:  Goal status: IN PROGRESS  4.  Pt and spouse will report improved participation in household responsibilities using self-anchored rating scale Baseline:  Goal status: IN PROGRESS  ASSESSMENT:  CLINICAL IMPRESSION: Patient is a 76 y.o. M who was seen today for cognitive assessment for ongoing change in cognitive functioning s/p TIA Nove 2022. Pt presenting with mild to moderate impairment in areas of attention, memory, executive functioning, resulting in decreased participation in household management, preferred avocational activities and self-care. Currently spouse is managing household and providing A for all ADLs. Even with A, breakdowns reported in medication and schedule management resulting in missed doses and appointments. Reduced QoL d/t limited participation in desired activities. I recommend skilled ST to  maximize cognition and train in compensatory strategies for cognition for safety, to reduce caregiver burden and to improve pt's QoL.   OBJECTIVE IMPAIRMENTS include attention, memory, awareness, and executive functioning. These impairments are limiting patient from managing medications, managing appointments, managing finances, household responsibilities, and ADLs/IADLs. Factors affecting potential to achieve goals and functional outcome are ability to learn/carryover information,  cooperation/participation level, and previous level of function.. Patient will benefit from skilled SLP services to address above impairments and improve overall function.  REHAB POTENTIAL: Good  PLAN: SLP FREQUENCY: 2x/week  SLP DURATION: 12 weeks  PLANNED INTERVENTIONS: Cueing hierachy, Cognitive reorganization, Internal/external aids, Functional tasks, SLP instruction and feedback, Compensatory strategies, and Patient/family education    Su Monks, CCC-SLP 03/14/2022, 8:46 AM
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
## 2022-03-14 NOTE — Therapy (Signed)
OUTPATIENT PHYSICAL THERAPY TREATMENT NOTE/ DISCHARGE SUMMARY   Patient Name: Nicholas Mckenzie MRN: [id number] DOB:[birth year], 76 y.o., male Today's Date: 03/14/2022  PCP: Early Osmond, MD REFERRING PROVIDER: [name], MD   PHYSICAL THERAPY DISCHARGE SUMMARY  Visits from Start of Care: 8  Current functional level related to goals / functional outcomes: ModI/ supervision using rollator; limited by R knee pain, endurance and functional cognition   Remaining deficits: Considered a fall risk and benefits from using rollator   Education / Equipment: PT POC, HEP, outcome measure results, home safety, fall precautions   Patient agrees to discharge. Patient goals were met. Patient is being discharged due to meeting the stated rehab goals.  END OF SESSION:   PT End of Session - 03/14/22 0928     Visit Number 8    Number of Visits 13    Date for PT Re-Evaluation 03/16/22    Authorization Type [employer]    PT Start Time 0930    PT Stop Time 7408   discharge   PT Time Calculation (min) 32 min    Equipment Utilized During Treatment Gait belt    Activity Tolerance Patient tolerated treatment well    Behavior During Therapy WFL for tasks assessed/performed              Past Medical History:  Diagnosis Date   CKD (chronic kidney disease) stage 3, GFR 30-59 ml/min ([place])    Hypertension    Kidney stones    Sleep apnea    Past Surgical History:  Procedure Laterality Date   BLADDER STONE REMOVAL  12/2021   CATARACT EXTRACTION     IR NEPHROSTOMY EXCHANGE RIGHT  06/07/2021   IR NEPHROSTOMY EXCHANGE RIGHT  08/22/2021   IR NEPHROSTOMY PLACEMENT RIGHT  04/07/2021   IR PATIENT EVAL TECH 0-60 MINS  06/02/2021   kidney stent     MANDIBLE FRACTURE SURGERY     NEPHRECTOMY Right 01/07/2022   Patient Active Problem List   Diagnosis Date Noted   Hypernatremia 08/23/2021   Malnutrition of moderate degree 08/17/2021   Acute respiratory failure with hypoxia (HCC) 08/16/2021    Severe sepsis ([place]) 08/16/2021   Sore throat 08/16/2021   Hx of subdural hematoma 08/16/2021   Nephrolithiasis 08/16/2021   Stage 3a chronic kidney disease (CKD) ([place]) 08/16/2021   Obesity (BMI 30-39.9) 08/16/2021   OSA (obstructive sleep apnea) 08/16/2021   Aspiration pneumonia ([place]) 08/15/2021   Snoring 08/08/2021   Frequent urination 06/07/2021   Acute lower UTI 06/07/2021   Ascending aortic aneurysm ([place]) 06/07/2021   Atrophy of right kidney 06/07/2021   Anemia 06/07/2021   Obesity (BMI 30.0-34.9) 06/07/2021   Subdural hematoma ([place]) 06/06/2021   COVID-19 virus infection 06/06/2021   Hypokalemia 06/06/2021   H/O ischemic right PCA stroke 06/06/2021   History of cardioembolic cerebrovascular accident (CVA) 06/06/2021   Right-sided cerebrovascular accident (CVA) ([place]) 04/14/2021   Nonfunctioning kidney 04/14/2021   Goals of care, counseling/discussion 04/03/2021   Ureteral stent occlusion ([place]) 04/02/2021   CKD (chronic kidney disease) stage 3a 03/11/2021   Major depressive disorder, recurrent episode, in partial remission ([place]) 03/11/2021   Hypertension    Sleep apnea    GAD (generalized anxiety disorder) 07/20/2019    REFERRING DIAG: R26.0 (ICD-10-CM) - Ataxic gait  THERAPY DIAG:  Muscle weakness (generalized)  Unsteadiness on feet  History of falling  Other lack of coordination  Other abnormalities of gait and mobility  Rationale for Evaluation and Treatment Rehabilitation  PERTINENT HISTORY: acute/subacute  R PCA stroke kidney CKD, hypertension, prior CVA   PRECAUTIONS: fall- check BP  SUBJECTIVE: Patient reports doing well. Denies falls/near falls.   PAIN:  Are you having pain? No, just soreness in R knee and neck  VITALS:  There were no vitals filed for this visit.     TODAY'S TREATMENT:  [facility] PT Assessment - 03/14/22 0001       Standardized Balance Assessment   10 Meter Walk .41ms      Timed Up and Go Test   Normal TUG (seconds) 25.55       Functional Gait  Assessment   Gait assessed  Yes    Gait Level Surface Walks 20 ft in less than 7 sec but greater than 5.5 sec, uses assistive device, slower speed, mild gait deviations, or deviates 6-10 in outside of the 12 in walkway width.    Change in Gait Speed Able to change speed, demonstrates mild gait deviations, deviates 6-10 in outside of the 12 in walkway width, or no gait deviations, unable to achieve a major change in velocity, or uses a change in velocity, or uses an assistive device.    Gait with Horizontal Head Turns Performs head turns smoothly with slight change in gait velocity (eg, minor disruption to smooth gait path), deviates 6-10 in outside 12 in walkway width, or uses an assistive device.    Gait with Vertical Head Turns Performs task with slight change in gait velocity (eg, minor disruption to smooth gait path), deviates 6 - 10 in outside 12 in walkway width or uses assistive device    Gait and Pivot Turn Pivot turns safely in greater than 3 sec and stops with no loss of balance, or pivot turns safely within 3 sec and stops with mild imbalance, requires small steps to catch balance.    Step Over Obstacle Cannot perform without assistance.    Gait with Narrow Base of Support Ambulates 7-9 steps.    Gait with Eyes Closed Walks 20 ft, slow speed, abnormal gait pattern, evidence for imbalance, deviates 10-15 in outside 12 in walkway width. Requires more than 9 sec to ambulate 20 ft.    Ambulating Backwards Walks 20 ft, slow speed, abnormal gait pattern, evidence for imbalance, deviates 10-15 in outside 12 in walkway width.    Steps Alternating feet, must use rail.    Total Score 16                PATIENT EDUCATION: Education details: continue HEP as able, wear R knee brace, use heat or ice for pain management Person educated: Patient and Spouse Education method: Explanation Education comprehension: verbalized understanding and needs further education     HOME  EXERCISE PROGRAM: Access Code: [email or web address]: [email or web address] Date: 02/05/2022 Prepared by: JEstevan Ryder Exercises - Sit to Stand with Counter Support  - 1 x daily - 7 x weekly - 3 sets - 10 reps - Standing March with Counter Support  - 1 x daily - 7 x weekly - 3 sets - 10 reps - Side Stepping with Counter Support  - 1 x daily - 7 x weekly - 3 sets - 10 reps       GOALS: Goals reviewed with patient? Yes   SHORT TERM GOALS: Target date: 02/23/2022   Pt will be independent with initial HEP for improved functional strength and balance  Baseline: 02/28/22: has program, has been inconsistent past few weeks  Goal status: PARTIALLY MET   2.  Pt will improve  TUG to </= 60 secs to demonstrated reduced fall risk Baseline: 02/28/22: 26.72 seconds with rollator, SBA/CGA Goal status: MET   3.  Pt will improve FGA to >/= 13/30 to demonstrate improved balance and reduced fall risk Baseline: 02/28/22: 13/30 scored with rollator today Goal status: MET   4.  Patients gait speed to be assessed and LTG to be added as appropriate  Baseline: .53ms Goal status: MET     LONG TERM GOALS: Target date: 03/16/2022   Pt will be independent with final HEP for improved functional strength and balance  Baseline: to be provided; provided  Goal status: MET   2.  Pt will improve TUG to </= 45 secs to demonstrated reduced fall risk Baseline: 02/28/22: 26.72 sec's with rollator; 25.5s with rollator Goal status: MET   3.  Pt will improve FGA to >/= 16/30 to demonstrate improved balance and reduced fall risk Baseline: 8/30; 16/30 Goal status: MET  4. Pt will improve gait speed to >/= .642m to demonstrate improved community ambulation  Baseline: .02/28/22: 0.66 m/s with rollator; .7898mwith rollator  Goal status: MET   ASSESSMENT:   CLINICAL IMPRESSION: Patient seen for skilled PT session with emphasis on goal assessment and discharge. He met 4/4 LTG. Patient completed the Timed Up and Go  test (TUG) in 25.5 seconds.  Geriatrics: need for further assessment of fall risk: ? 12 sec; Recurrent falls: > 15 sec; Vestibular Disorders fall risk: > 15 sec; Parkinson's Disease fall risk: > 16 sec ([email or web address]). 10 Meter Walk Test: Patient instructed to walk 10 meters (32.8 ft) as quickly and as safely as possible at their normal speed x2 and at a fast speed x2. Time measured from 2 meter mark to 8 meter mark to accommodate ramp-up and ramp-down.  Normal speed: .92m75mut off scores: <0.4 m/s = household Ambulator, 0.4-0.8 m/s = limited community Ambulator, >0.8 m/s = community Ambulator, >1.2 m/s = crossing a street, <1.0 = increased fall risk MCID 0.05 m/s (small), 0.13 m/s (moderate), 0.06 m/s (significant)  (ANPTA Core Set of Outcome Measures for Adults with Neurologic Conditions, 2018). Patient demonstrates increased fall risk as noted by score of 16/30 on  Functional Gait Assessment.   <22/30 = predictive of falls, <20/30 = fall in 6 months, <18/30 = predictive of falls in PD MCID: 5 points stroke population, 4 points geriatric population (ANPTA Core Set of Outcome Measures for Adults with Neurologic Conditions, 2018). Patient to discharge from therapy at this time and continue his exercises at home.     OBJECTIVE IMPAIRMENTS Abnormal gait, decreased activity tolerance, decreased balance, decreased cognition, decreased coordination, decreased endurance, decreased knowledge of condition, decreased knowledge of use of DME, decreased safety awareness, impaired perceived functional ability, and impaired vision/preception.    ACTIVITY LIMITATIONS carrying, lifting, bending, standing, squatting, stairs, transfers, bed mobility, and locomotion level   PARTICIPATION LIMITATIONS: cleaning, medication management, interpersonal relationship, driving, shopping, and community activity   PERSONAL FACTORS Age, Behavior pattern, Past/current experiences, Time since onset of  injury/illness/exacerbation, and 3+ comorbidities: HTN, CKD, homonymous hemianopsia, cog deficits  are also affecting patient's functional outcome.    REHAB POTENTIAL: Fair time since onset   CLINICAL DECISION MAKING: Evolving/moderate complexity   EVALUATION COMPLEXITY: Moderate   PLAN: PT FREQUENCY: 2x/week   PT DURATION: 6 weeks   PLANNED INTERVENTIONS: Therapeutic exercises, Therapeutic activity, Neuromuscular re-education, Balance training, Gait training, Patient/Family education, Self Care, Joint mobilization, Stair training, Vestibular training, Visual/preceptual remediation/compensation, Orthotic/Fit training, DME instructions, Aquatic Therapy, Cognitive  remediation, Wheelchair mobility training, Manual therapy, and Re-evaluation   PLAN FOR NEXT SESSION:  D/c from PT    Debbora Dus, PT, DPT, CBIS  03/14/22, 10:06 AM

## 2022-03-19 ENCOUNTER — Ambulatory Visit: Payer: Medicare PPO

## 2022-03-19 ENCOUNTER — Encounter: Payer: Self-pay | Admitting: Occupational Therapy

## 2022-03-19 ENCOUNTER — Ambulatory Visit: Payer: Medicare PPO | Admitting: Speech Pathology

## 2022-03-19 ENCOUNTER — Ambulatory Visit: Payer: Medicare PPO | Admitting: Occupational Therapy

## 2022-03-19 DIAGNOSIS — R41841 Cognitive communication deficit: Secondary | ICD-10-CM | POA: Diagnosis not present

## 2022-03-19 DIAGNOSIS — R278 Other lack of coordination: Secondary | ICD-10-CM

## 2022-03-19 DIAGNOSIS — R2681 Unsteadiness on feet: Secondary | ICD-10-CM | POA: Diagnosis not present

## 2022-03-19 DIAGNOSIS — R471 Dysarthria and anarthria: Secondary | ICD-10-CM | POA: Diagnosis not present

## 2022-03-19 DIAGNOSIS — M6281 Muscle weakness (generalized): Secondary | ICD-10-CM

## 2022-03-19 DIAGNOSIS — R2689 Other abnormalities of gait and mobility: Secondary | ICD-10-CM | POA: Diagnosis not present

## 2022-03-19 DIAGNOSIS — R41842 Visuospatial deficit: Secondary | ICD-10-CM | POA: Diagnosis not present

## 2022-03-19 DIAGNOSIS — R41844 Frontal lobe and executive function deficit: Secondary | ICD-10-CM | POA: Diagnosis not present

## 2022-03-19 DIAGNOSIS — Z9181 History of falling: Secondary | ICD-10-CM | POA: Diagnosis not present

## 2022-03-19 NOTE — Therapy (Signed)
OUTPATIENT SPEECH LANGUAGE PATHOLOGY TREATMENT   Patient Name: Nicholas Mckenzie MRN: [id number] DOB:[birth year], 76 y.o., male Today's Date: 03/19/2022  PCP: [name], MD REFERRING PROVIDER: [name], MD   End of Session - 03/19/22 0843     Visit Number 4    Number of Visits 25    Date for SLP Re-Evaluation 05/25/22    Authorization Type [employer]    Progress Note Due on Visit 10    SLP Start Time 0845    SLP Stop Time  0930    SLP Time Calculation (min) 45 min    Activity Tolerance Patient tolerated treatment well               Past Medical History:  Diagnosis Date   CKD (chronic kidney disease) stage 3, GFR 30-59 ml/min ([place])    Hypertension    Kidney stones    Sleep apnea    Past Surgical History:  Procedure Laterality Date   BLADDER STONE REMOVAL  12/2021   CATARACT EXTRACTION     IR NEPHROSTOMY EXCHANGE RIGHT  06/07/2021   IR NEPHROSTOMY EXCHANGE RIGHT  08/22/2021   IR NEPHROSTOMY PLACEMENT RIGHT  04/07/2021   IR PATIENT EVAL TECH 0-60 MINS  06/02/2021   kidney stent     MANDIBLE FRACTURE SURGERY     NEPHRECTOMY Right 01/07/2022   Patient Active Problem List   Diagnosis Date Noted   Hypernatremia 08/23/2021   Malnutrition of moderate degree 08/17/2021   Acute respiratory failure with hypoxia ([place]) 08/16/2021   Severe sepsis ([place]) 08/16/2021   Sore throat 08/16/2021   Hx of subdural hematoma 08/16/2021   Nephrolithiasis 08/16/2021   Stage 3a chronic kidney disease (CKD) ([place]) 08/16/2021   Obesity (BMI 30-39.9) 08/16/2021   OSA (obstructive sleep apnea) 08/16/2021   Aspiration pneumonia ([place]) 08/15/2021   Snoring 08/08/2021   Frequent urination 06/07/2021   Acute lower UTI 06/07/2021   Ascending aortic aneurysm ([place]) 06/07/2021   Atrophy of right kidney 06/07/2021   Anemia 06/07/2021   Obesity (BMI 30.0-34.9) 06/07/2021   Subdural hematoma ([place]) 06/06/2021   COVID-19 virus infection 06/06/2021   Hypokalemia 06/06/2021   H/O  ischemic right PCA stroke 06/06/2021   History of cardioembolic cerebrovascular accident (CVA) 06/06/2021   Right-sided cerebrovascular accident (CVA) ([place]) 04/14/2021   Nonfunctioning kidney 04/14/2021   Goals of care, counseling/discussion 04/03/2021   Ureteral stent occlusion ([place]) 04/02/2021   CKD (chronic kidney disease) stage 3a 03/11/2021   Major depressive disorder, recurrent episode, in partial remission ([place]) 03/11/2021   Hypertension    Sleep apnea    GAD (generalized anxiety disorder) 07/20/2019    ONSET DATE: referral date 02/03/2022  REFERRING DIAG: R41.89 (ICD-10-CM) - Cognitive change  THERAPY DIAG:  Cognitive communication deficit  Rationale for Evaluation and Treatment Rehabilitation  SUBJECTIVE:   SUBJECTIVE STATEMENT: Reports "forgot" morning medications, attributing to being distracted   PAIN:  Are you having pain? No  OBJECTIVE:   TODAY'S TREATMENT:  03-19-22: Pt did not take morning meds this morning, has not altered morning medication routine, [name] is still bringing to pt. With supervision-A, questioning cues, pt able to teach back modifications to routine pt agreed to during last ST session. SLP provides education on creating routine and important and effective memory strategy and gave tips on how to establish that altered routine. Additional education provided to care-partner, [name], on how to cue Jordanny to aid in his participation in this novel routine. Pt and [name] verbalize understanding, will begin  implementation tomorrow. Trial x2 calendar options to aid in orientation to schedule and recall of events & established goals. First option, full month calendar is not effective choice for pt d/t excessive information, too small of spaces. Second option, weekly planner sheet appears to be good fit for [employer] d/t size of spaces, limited information, space for weekly goals, and simplicity of design. Pt in agreement. Writes in x4 weekly appointments, all dates accurately  with supervision-A. ID his two weekly goals with questioning cues. SLP provides multiple suggestions of environmental placements that may be effective for pt.   03-14-22: [name] able to recall goal for bathroom clean up last session. [name] hung Engineer, mining. SLP provided education on cueing heirarchy for spouse to aid in increased independence for recall of desired tasks. Navraj able to relay vague details about medication administration regimen. Supplemental information from [name] required for SLP to understand routine. Generated alternative strategy to facilitate successful increased independence with pt taking fridge based supplements without [name] having to deliver, see handout. Pt able to verbalize new routine with mod-I following 5 minute delay. Education on need to establish routine with support initially, and how to fade that support for increased independence. Pt and spouse verbalize understanding.   03-12-22: Use of self-anchored rating scale to generate pt centered goal re: increased household participation. Established small goal of cleaning up after self in bathroom. Pt able to ID x4 actions which would benefit accomplishment of this small goal. SLP generated visual aid to facilitate success in completing daily tasks re: bathroom clean up, anchored to bright pink paper. Spouse will take to mirror. SLP advises that spouse may need to move around every few days to encourage visual attention to stimuli. SLP provided coaching on cueing strategies spouse can use to aid pt in establishing habits for long term successful employment of clean up activities.   03-02-22: Education provided on evaluation results and SLP recommendations. Generated person centered goals through collaborative effort with patient and spouse. All questions answered to satisfaction; spouse and patient verbalize agreement with POC.   Trial stimulability for meta-cognitive strategy- Goal Management Training- using prior incidence of  fall. Given mod verbal cues, pt able to ID problem and generate x2 potential solutions of hiring someone to complete task or moving tools to place not requiring reach while standing.    PATIENT EDUCATION: Education details: see above Person educated: Patient and Spouse Education method: Explanation, Demonstration, Verbal cues, and Handouts Education comprehension: verbalized understanding, returned demonstration, and needs further education   GOALS: Goals reviewed with patient? Yes  SHORT TERM GOALS: Target date: 03/30/2022  Pt will use external aids to recall appointments with occasional min A from spouse over 1 week Baseline: Goal status: IN PROGRESS  2.  Pt will use to do list or other memory aid to complete 2 household chores 5/7 days a week with occasional min A from spouse Baseline:  Goal status: IN PROGRESS  3.  Pt will verbalize 3 safety concerns re: mobility and cognition and generate appropriate modification using trained meta-cognitive strategy with rare min A Baseline:  Goal status: IN PROGRESS  4.  Pt will implement external aid to facilitate HEP completion per therapist recommendations over 1 week period with occasional min-A from spouse Baseline:  Goal status: IN PROGRESS  5.  Pt will generate external aids (e.g. medication chart) to facilitate increased independence in medication management with usual mod-A  Baseline:  Goal status: IN PROGRESS   LONG TERM GOALS: Target date:  05/25/2022  Pt will accurately sort medications into pill box with mod-I  Baseline:  Goal status: IN PROGRESS  2.  Pt will implement AM medication management routine resulting in adherence to medication administration over 1 week period with rare min-A from spouse Baseline:  Goal status: IN PROGRESS  3.  Pt will complete 3 house hold chores 5/7 days with to do list or other external aid Baseline:  Goal status: IN PROGRESS  4.  Pt and spouse will report improved participation in  household responsibilities using self-anchored rating scale Baseline:  Goal status: IN PROGRESS  ASSESSMENT:  CLINICAL IMPRESSION: Patient is a 76 y.o. M who was seen today for cognitive assessment for ongoing change in cognitive functioning s/p TIA Nove 2022. Pt presenting with mild to moderate impairment in areas of attention, memory, executive functioning, resulting in decreased participation in household management, preferred avocational activities and self-care. Currently spouse is managing household and providing A for all ADLs. Even with A, breakdowns reported in medication and schedule management resulting in missed doses and appointments. Reduced QoL d/t limited participation in desired activities. I recommend skilled ST to maximize cognition and train in compensatory strategies for cognition for safety, to reduce caregiver burden and to improve pt's QoL.   OBJECTIVE IMPAIRMENTS include attention, memory, awareness, and executive functioning. These impairments are limiting patient from managing medications, managing appointments, managing finances, household responsibilities, and ADLs/IADLs. Factors affecting potential to achieve goals and functional outcome are ability to learn/carryover information, cooperation/participation level, and previous level of function.. Patient will benefit from skilled SLP services to address above impairments and improve overall function.  REHAB POTENTIAL: Good  PLAN: SLP FREQUENCY: 2x/week  SLP DURATION: 12 weeks  PLANNED INTERVENTIONS: Cueing hierachy, Cognitive reorganization, Internal/external aids, Functional tasks, SLP instruction and feedback, Compensatory strategies, and Patient/family education    Su Monks, CCC-SLP 03/19/2022, 8:44 AM
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
## 2022-03-19 NOTE — Patient Instructions (Addendum)
  Coordination Activities  Perform the following activities for 10 minutes 1-2 times per day with left hand(s).  Rotate ball in fingertips (clockwise and counter-clockwise). Flip cards 1 at a time as fast as you can. Deal cards with your thumb (Hold deck in hand and push card off top with thumb). Rotate one card in hand (clockwise and counter-clockwise). Pick up coins, buttons, marbles, dried beans/pasta of different sizes and place in container. Pick up coins and stack. Practice writing. Screw together nuts and bolts, then unfasten.

## 2022-03-20 DIAGNOSIS — N1831 Chronic kidney disease, stage 3a: Secondary | ICD-10-CM | POA: Diagnosis not present

## 2022-03-20 DIAGNOSIS — R339 Retention of urine, unspecified: Secondary | ICD-10-CM | POA: Diagnosis not present

## 2022-03-20 DIAGNOSIS — Z23 Encounter for immunization: Secondary | ICD-10-CM | POA: Diagnosis not present

## 2022-03-20 DIAGNOSIS — N261 Atrophy of kidney (terminal): Secondary | ICD-10-CM | POA: Diagnosis not present

## 2022-03-20 DIAGNOSIS — Z978 Presence of other specified devices: Secondary | ICD-10-CM | POA: Diagnosis not present

## 2022-03-21 ENCOUNTER — Encounter: Payer: Self-pay | Admitting: Speech Pathology

## 2022-03-21 ENCOUNTER — Ambulatory Visit: Payer: Medicare PPO | Admitting: Speech Pathology

## 2022-03-21 ENCOUNTER — Encounter: Payer: Self-pay | Admitting: Occupational Therapy

## 2022-03-21 ENCOUNTER — Ambulatory Visit: Payer: Medicare PPO | Admitting: Physical Therapy

## 2022-03-21 ENCOUNTER — Ambulatory Visit: Payer: Medicare PPO | Admitting: Occupational Therapy

## 2022-03-21 DIAGNOSIS — M6281 Muscle weakness (generalized): Secondary | ICD-10-CM | POA: Diagnosis not present

## 2022-03-21 DIAGNOSIS — R41842 Visuospatial deficit: Secondary | ICD-10-CM

## 2022-03-21 DIAGNOSIS — R41841 Cognitive communication deficit: Secondary | ICD-10-CM

## 2022-03-21 DIAGNOSIS — R2689 Other abnormalities of gait and mobility: Secondary | ICD-10-CM | POA: Diagnosis not present

## 2022-03-21 DIAGNOSIS — R41844 Frontal lobe and executive function deficit: Secondary | ICD-10-CM | POA: Diagnosis not present

## 2022-03-21 DIAGNOSIS — R2681 Unsteadiness on feet: Secondary | ICD-10-CM | POA: Diagnosis not present

## 2022-03-21 DIAGNOSIS — R278 Other lack of coordination: Secondary | ICD-10-CM

## 2022-03-21 DIAGNOSIS — Z9181 History of falling: Secondary | ICD-10-CM | POA: Diagnosis not present

## 2022-03-21 DIAGNOSIS — R471 Dysarthria and anarthria: Secondary | ICD-10-CM | POA: Diagnosis not present
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
## 2022-03-21 NOTE — Patient Instructions (Signed)
   [facility] Maids  Rotate  a deep cleaning in a room each time  Be specific about what you want  See if anyone supervises or checks on the work periodically  Let them know they may have to de-clutter before they clean  Look up other cleaning agencies on line or ask neighbors  Tell them you may be home when they clean but will try to be in a different room  Let them know which room to clean first  Factor meal delivery  Personal chefs or meal delivery online - they can cater to your diet needs  Look on the [employer] bill board

## 2022-03-23 NOTE — Therapy (Unsigned)
OUTPATIENT SPEECH LANGUAGE PATHOLOGY TREATMENT   Patient Name: Nicholas Mckenzie MRN: [id number] DOB:[birth year], 76 y.o., male Today's Date: 03/23/2022  PCP: [name], MD REFERRING PROVIDER: [name], MD       Past Medical History:  Diagnosis Date   CKD (chronic kidney disease) stage 3, GFR 30-59 ml/min ([place])    Hypertension    Kidney stones    Sleep apnea    Past Surgical History:  Procedure Laterality Date   BLADDER STONE REMOVAL  12/2021   CATARACT EXTRACTION     IR NEPHROSTOMY EXCHANGE RIGHT  06/07/2021   IR NEPHROSTOMY EXCHANGE RIGHT  08/22/2021   IR NEPHROSTOMY PLACEMENT RIGHT  04/07/2021   IR PATIENT EVAL TECH 0-60 MINS  06/02/2021   kidney stent     MANDIBLE FRACTURE SURGERY     NEPHRECTOMY Right 01/07/2022   Patient Active Problem List   Diagnosis Date Noted   Hypernatremia 08/23/2021   Malnutrition of moderate degree 08/17/2021   Acute respiratory failure with hypoxia (HCC) 08/16/2021   Severe sepsis ([place]) 08/16/2021   Sore throat 08/16/2021   Hx of subdural hematoma 08/16/2021   Nephrolithiasis 08/16/2021   Stage 3a chronic kidney disease (CKD) ([place]) 08/16/2021   Obesity (BMI 30-39.9) 08/16/2021   OSA (obstructive sleep apnea) 08/16/2021   Aspiration pneumonia ([place]) 08/15/2021   Snoring 08/08/2021   Frequent urination 06/07/2021   Acute lower UTI 06/07/2021   Ascending aortic aneurysm ([place]) 06/07/2021   Atrophy of right kidney 06/07/2021   Anemia 06/07/2021   Obesity (BMI 30.0-34.9) 06/07/2021   Subdural hematoma ([place]) 06/06/2021   COVID-19 virus infection 06/06/2021   Hypokalemia 06/06/2021   H/O ischemic right PCA stroke 06/06/2021   History of cardioembolic cerebrovascular accident (CVA) 06/06/2021   Right-sided cerebrovascular accident (CVA) ([place]) 04/14/2021   Nonfunctioning kidney 04/14/2021   Goals of care, counseling/discussion 04/03/2021   Ureteral stent occlusion ([place]) 04/02/2021   CKD (chronic kidney disease) stage 3a  03/11/2021   Major depressive disorder, recurrent episode, in partial remission ([place]) 03/11/2021   Hypertension    Sleep apnea    GAD (generalized anxiety disorder) 07/20/2019    ONSET DATE: referral date 02/03/2022  REFERRING DIAG: R41.89 (ICD-10-CM) - Cognitive change  THERAPY DIAG:  No diagnosis found.  Rationale for Evaluation and Treatment Rehabilitation  SUBJECTIVE:   SUBJECTIVE STATEMENT: ***  PAIN:  Are you having pain? No  OBJECTIVE:   TODAY'S TREATMENT:    03-26-22: ***   PATIENT EDUCATION: Education details: see above Person educated: Patient and Spouse Education method: Explanation, Demonstration, Verbal cues, and Handouts Education comprehension: verbalized understanding, returned demonstration, and needs further education   GOALS: Goals reviewed with patient? Yes  SHORT TERM GOALS: Target date: 03/30/2022  Pt will use external aids to recall appointments with occasional min A from spouse over 1 week Baseline: Goal status: IN PROGRESS  2.  Pt will use to do list or other memory aid to complete 2 household chores 5/7 days a week with occasional min A from spouse Baseline:  Goal status: IN PROGRESS  3.  Pt will verbalize 3 safety concerns re: mobility and cognition and generate appropriate modification using trained meta-cognitive strategy with rare min A Baseline:  Goal status: IN PROGRESS  4.  Pt will implement external aid to facilitate HEP completion per therapist recommendations over 1 week period with occasional min-A from spouse Baseline:  Goal status: IN PROGRESS  5.  Pt will generate external aids (e.g. medication chart) to facilitate increased independence in  medication management with usual mod-A  Baseline:  Goal status: IN PROGRESS   LONG TERM GOALS: Target date: 05/25/2022  Pt will accurately sort medications into pill box with mod-I  Baseline:  Goal status: IN PROGRESS  2.  Pt will implement AM medication management routine  resulting in adherence to medication administration over 1 week period with rare min-A from spouse Baseline:  Goal status: IN PROGRESS  3.  Pt will complete 3 house hold chores 5/7 days with to do list or other external aid Baseline:  Goal status: IN PROGRESS  4.  Pt and spouse will report improved participation in household responsibilities using self-anchored rating scale Baseline:  Goal status: IN PROGRESS  ASSESSMENT:  CLINICAL IMPRESSION: Patient is a 76 y.o. M who was seen today for cognitive assessment for ongoing change in cognitive functioning s/p TIA Nove 2022. Pt presenting with mild to moderate impairment in areas of attention, memory, executive functioning, resulting in decreased participation in household management, preferred avocational activities and self-care. Currently spouse is managing household and providing A for all ADLs. Even with A, breakdowns reported in medication and schedule management resulting in missed doses and appointments. Reduced QoL d/t limited participation in desired activities. I recommend skilled ST to maximize cognition and train in compensatory strategies for cognition for safety, to reduce caregiver burden and to improve pt's QoL.   OBJECTIVE IMPAIRMENTS include attention, memory, awareness, and executive functioning. These impairments are limiting patient from managing medications, managing appointments, managing finances, household responsibilities, and ADLs/IADLs. Factors affecting potential to achieve goals and functional outcome are ability to learn/carryover information, cooperation/participation level, and previous level of function.. Patient will benefit from skilled SLP services to address above impairments and improve overall function.  REHAB POTENTIAL: Good  PLAN: SLP FREQUENCY: 2x/week  SLP DURATION: 12 weeks  PLANNED INTERVENTIONS: Cueing hierachy, Cognitive reorganization, Internal/external aids, Functional tasks, SLP instruction  and feedback, Compensatory strategies, and Patient/family education    Su Monks, CCC-SLP 03/23/2022, 2:05 PM
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
## 2022-03-26 ENCOUNTER — Encounter: Payer: Self-pay | Admitting: Occupational Therapy

## 2022-03-26 ENCOUNTER — Ambulatory Visit: Payer: Medicare PPO | Admitting: Occupational Therapy

## 2022-03-26 ENCOUNTER — Ambulatory Visit: Payer: Medicare PPO | Attending: Internal Medicine | Admitting: Speech Pathology

## 2022-03-26 DIAGNOSIS — M6281 Muscle weakness (generalized): Secondary | ICD-10-CM | POA: Insufficient documentation

## 2022-03-26 DIAGNOSIS — R2681 Unsteadiness on feet: Secondary | ICD-10-CM

## 2022-03-26 DIAGNOSIS — R41841 Cognitive communication deficit: Secondary | ICD-10-CM | POA: Insufficient documentation

## 2022-03-26 DIAGNOSIS — R41840 Attention and concentration deficit: Secondary | ICD-10-CM

## 2022-03-26 DIAGNOSIS — R41842 Visuospatial deficit: Secondary | ICD-10-CM

## 2022-03-26 DIAGNOSIS — R41844 Frontal lobe and executive function deficit: Secondary | ICD-10-CM | POA: Insufficient documentation

## 2022-03-26 DIAGNOSIS — R278 Other lack of coordination: Secondary | ICD-10-CM | POA: Diagnosis not present

## 2022-03-28 ENCOUNTER — Ambulatory Visit: Payer: Medicare PPO | Admitting: Speech Pathology

## 2022-03-28 ENCOUNTER — Ambulatory Visit: Payer: Medicare PPO

## 2022-03-28 DIAGNOSIS — R41844 Frontal lobe and executive function deficit: Secondary | ICD-10-CM | POA: Diagnosis not present

## 2022-03-28 DIAGNOSIS — R41842 Visuospatial deficit: Secondary | ICD-10-CM | POA: Diagnosis not present

## 2022-03-28 DIAGNOSIS — R2681 Unsteadiness on feet: Secondary | ICD-10-CM | POA: Diagnosis not present

## 2022-03-28 DIAGNOSIS — R41841 Cognitive communication deficit: Secondary | ICD-10-CM

## 2022-03-28 DIAGNOSIS — M6281 Muscle weakness (generalized): Secondary | ICD-10-CM | POA: Diagnosis not present

## 2022-03-28 DIAGNOSIS — R41840 Attention and concentration deficit: Secondary | ICD-10-CM | POA: Diagnosis not present

## 2022-03-28 DIAGNOSIS — R278 Other lack of coordination: Secondary | ICD-10-CM | POA: Diagnosis not present

## 2022-03-28 NOTE — Patient Instructions (Signed)
[email or web address]  [phone]  Kalman Shan-- she is a personal care aid who could potentially be available for some as needed help [phone] (referred by Georgiann Hahn)  Potentially use [facility]   Try Googling: "NCDHSS In home aide"  https://www.ncdhhs.gov/divisions/aging-and-adult-services/home-aide#:~:text=How%20do%20I%20get%74more,call%20919%2D855%2D3400.

## 2022-03-28 NOTE — Therapy (Signed)
OUTPATIENT SPEECH LANGUAGE PATHOLOGY TREATMENT   Patient Name: Nicholas Mckenzie MRN: [id number] DOB:1946-04-13, 76 y.o., male Today's Date: 03/28/2022  PCP: [name], MD REFERRING PROVIDER: [name], MD   End of Session - 03/28/22 0758     Visit Number 7    Number of Visits 25    Date for SLP Re-Evaluation 05/25/22    Authorization Type [employer]    Progress Note Due on Visit 10    SLP Start Time 0800    SLP Stop Time  0843    SLP Time Calculation (min) 43 min    Activity Tolerance Patient tolerated treatment well                Past Medical History:  Diagnosis Date   CKD (chronic kidney disease) stage 3, GFR 30-59 ml/min ([place])    Hypertension    Kidney stones    Sleep apnea    Past Surgical History:  Procedure Laterality Date   BLADDER STONE REMOVAL  12/2021   CATARACT EXTRACTION     IR NEPHROSTOMY EXCHANGE RIGHT  06/07/2021   IR NEPHROSTOMY EXCHANGE RIGHT  08/22/2021   IR NEPHROSTOMY PLACEMENT RIGHT  04/07/2021   IR PATIENT EVAL TECH 0-60 MINS  06/02/2021   kidney stent     MANDIBLE FRACTURE SURGERY     NEPHRECTOMY Right 01/07/2022   Patient Active Problem List   Diagnosis Date Noted   Hypernatremia 08/23/2021   Malnutrition of moderate degree 08/17/2021   Acute respiratory failure with hypoxia ([place]) 08/16/2021   Severe sepsis ([place]) 08/16/2021   Sore throat 08/16/2021   Hx of subdural hematoma 08/16/2021   Nephrolithiasis 08/16/2021   Stage 3a chronic kidney disease (CKD) ([place]) 08/16/2021   Obesity (BMI 30-39.9) 08/16/2021   OSA (obstructive sleep apnea) 08/16/2021   Aspiration pneumonia ([place]) 08/15/2021   Snoring 08/08/2021   Frequent urination 06/07/2021   Acute lower UTI 06/07/2021   Ascending aortic aneurysm ([place]) 06/07/2021   Atrophy of right kidney 06/07/2021   Anemia 06/07/2021   Obesity (BMI 30.0-34.9) 06/07/2021   Subdural hematoma ([place]) 06/06/2021   COVID-19 virus infection 06/06/2021   Hypokalemia 06/06/2021   H/O  ischemic right PCA stroke 06/06/2021   History of cardioembolic cerebrovascular accident (CVA) 06/06/2021   Right-sided cerebrovascular accident (CVA) ([place]) 04/14/2021   Nonfunctioning kidney 04/14/2021   Goals of care, counseling/discussion 04/03/2021   Ureteral stent occlusion ([place]) 04/02/2021   CKD (chronic kidney disease) stage 3a 03/11/2021   Major depressive disorder, recurrent episode, in partial remission ([place]) 03/11/2021   Hypertension    Sleep apnea    GAD (generalized anxiety disorder) 07/20/2019    ONSET DATE: referral date 02/03/2022  REFERRING DIAG: R41.89 (ICD-10-CM) - Cognitive change  THERAPY DIAG:  Cognitive communication deficit  Rationale for Evaluation and Treatment Rehabilitation  SUBJECTIVE:   SUBJECTIVE STATEMENT: "I wanted to go to the shed and she wouldn't let me"   PAIN:  Are you having pain? No  OBJECTIVE:   TODAY'S TREATMENT:    03-28-22: Pt reports took medications in kitchen this morning. [name] had to verbally cue pt to go to kitchen to take meds. Did not recall to take supplements in fridge. Generated visual aid to pair with medications on counter to cue pt for supplements. [name] verbalizes understanding of visual aid use and need to verbally cue should Thailan not attend to or execute next step in routine. Re-education on value of using routine to establish new habits with faded cueing if able.  Provided [name] and [name] with information re: in home aids. Provided handout with additional information. Per [name], x2 novel safety incidents over past day- walking through construction zone, to include muscling walker onto elevated concrete slab and rolling out of bed to try and get up off the floor vs usual way of getting out of bed. With consistent max-A, following direct instruction, pt able to ID x3 safety concerns related to these events. SLP ? comprehension of safety concerns and suspects continued reduced awareness of safety concerns despite thorough  explanation of concern from SLP and spouse. Is able to generate alternative solutions to avoid presented safety concern with use of questioning cues.   03-26-22: Pt reports has not implemented novel medication routine. Some tension reported re: Takeem doing more. Feliberto feels like he is "being babied," [name] reports decreased awareness impacting Cleophus's ability to do tasks he desired. E.g. Priyansh will try and get water then leave a puddle and trail of water into living room. SLP assisted in generating alternative methods to complete desired activities- such as using up with lid, adding cup holder to walker. Pt with reduced awareness of limitation- reports he can clean up mess, then acknowledges he is unable to bend over to wipe up water with max-A. Reports wanting to do work on truck, telling SLP being on a stool or step ladder is ok. With max-A, verbalizes x2 safety concerns with completing work on [employer].    PATIENT EDUCATION: Education details: see above Person educated: Patient and Spouse Education method: Explanation, Demonstration, Verbal cues, and Handouts Education comprehension: verbalized understanding, returned demonstration, and needs further education   GOALS: Goals reviewed with patient? Yes  SHORT TERM GOALS: Target date: 03/30/2022  Pt will use external aids to recall appointments with occasional min A from spouse over 1 week Baseline: 03/28/22 Goal status: MET  2.  Pt will use to do list or other memory aid to complete 2 household chores 5/7 days a week with occasional min A from spouse Baseline:  Goal status: NOT MET  3.  Pt will verbalize 3 safety concerns re: mobility and cognition and generate appropriate modification using trained meta-cognitive strategy with rare min A Baseline:  Goal status: NOT MET  4.  Pt will implement external aid to facilitate HEP completion per therapist recommendations over 1 week period with occasional min-A from spouse Baseline:  Goal  status: NOT MET  5.  Pt will generate external aids (e.g. medication chart) to facilitate increased independence in medication management with usual mod-A  Baseline:  Goal status: NOT MET   LONG TERM GOALS: Target date: 05/25/2022  Pt will accurately sort medications into pill box with mod-I  Baseline:  Goal status: IN PROGRESS  2.  Pt will implement AM medication management routine resulting in adherence to medication administration over 1 week period with rare min-A from spouse Baseline:  Goal status: IN PROGRESS  3.  Pt will complete 3 house hold chores 5/7 days with to do list or other external aid Baseline:  Goal status: IN PROGRESS  4.  Pt and spouse will report improved participation in household responsibilities using self-anchored rating scale Baseline:  Goal status: IN PROGRESS  ASSESSMENT:  CLINICAL IMPRESSION: Patient is a 76 y.o. M who was seen today for cognitive assessment for ongoing change in cognitive functioning s/p TIA Nove 2022. Pt presenting with mild to moderate impairment in areas of attention, memory, executive functioning, resulting in decreased participation in household management, preferred avocational activities and self-care.  Currently spouse is managing household and providing A for all ADLs. Even with A, breakdowns reported in medication and schedule management resulting in missed doses and appointments. Reduced QoL d/t limited participation in desired activities. I recommend skilled ST to maximize cognition and train in compensatory strategies for cognition for safety, to reduce caregiver burden and to improve pt's QoL.   OBJECTIVE IMPAIRMENTS include attention, memory, awareness, and executive functioning. These impairments are limiting patient from managing medications, managing appointments, managing finances, household responsibilities, and ADLs/IADLs. Factors affecting potential to achieve goals and functional outcome are ability to learn/carryover  information, cooperation/participation level, and previous level of function.. Patient will benefit from skilled SLP services to address above impairments and improve overall function.  REHAB POTENTIAL: Good  PLAN: SLP FREQUENCY: 2x/week  SLP DURATION: 12 weeks  PLANNED INTERVENTIONS: Cueing hierachy, Cognitive reorganization, Internal/external aids, Functional tasks, SLP instruction and feedback, Compensatory strategies, and Patient/family education    Su Monks, CCC-SLP 03/28/2022, 7:59 AM

## 2022-04-02 DIAGNOSIS — Z906 Acquired absence of other parts of urinary tract: Secondary | ICD-10-CM | POA: Diagnosis not present

## 2022-04-02 DIAGNOSIS — N1832 Chronic kidney disease, stage 3b: Secondary | ICD-10-CM | POA: Diagnosis not present

## 2022-04-02 DIAGNOSIS — N210 Calculus in bladder: Secondary | ICD-10-CM | POA: Diagnosis not present

## 2022-04-02 DIAGNOSIS — Z960 Presence of urogenital implants: Secondary | ICD-10-CM | POA: Diagnosis not present

## 2022-04-02 DIAGNOSIS — I129 Hypertensive chronic kidney disease with stage 1 through stage 4 chronic kidney disease, or unspecified chronic kidney disease: Secondary | ICD-10-CM | POA: Diagnosis not present

## 2022-04-02 DIAGNOSIS — N35912 Unspecified bulbous urethral stricture, male: Secondary | ICD-10-CM | POA: Diagnosis not present

## 2022-04-02 DIAGNOSIS — Z905 Acquired absence of kidney: Secondary | ICD-10-CM | POA: Diagnosis not present

## 2022-04-02 DIAGNOSIS — N200 Calculus of kidney: Secondary | ICD-10-CM | POA: Diagnosis not present

## 2022-04-03 DIAGNOSIS — N2889 Other specified disorders of kidney and ureter: Secondary | ICD-10-CM | POA: Diagnosis not present

## 2022-04-03 DIAGNOSIS — Z905 Acquired absence of kidney: Secondary | ICD-10-CM | POA: Diagnosis not present

## 2022-04-03 DIAGNOSIS — Z906 Acquired absence of other parts of urinary tract: Secondary | ICD-10-CM | POA: Diagnosis not present

## 2022-04-03 DIAGNOSIS — N210 Calculus in bladder: Secondary | ICD-10-CM | POA: Diagnosis not present

## 2022-04-03 DIAGNOSIS — N35912 Unspecified bulbous urethral stricture, male: Secondary | ICD-10-CM | POA: Diagnosis not present

## 2022-04-03 DIAGNOSIS — Z87440 Personal history of urinary (tract) infections: Secondary | ICD-10-CM | POA: Diagnosis not present

## 2022-04-03 DIAGNOSIS — Z960 Presence of urogenital implants: Secondary | ICD-10-CM | POA: Diagnosis not present

## 2022-04-04 ENCOUNTER — Ambulatory Visit: Payer: Medicare PPO | Admitting: Speech Pathology

## 2022-04-04 DIAGNOSIS — R41844 Frontal lobe and executive function deficit: Secondary | ICD-10-CM | POA: Diagnosis not present

## 2022-04-04 DIAGNOSIS — R278 Other lack of coordination: Secondary | ICD-10-CM | POA: Diagnosis not present

## 2022-04-04 DIAGNOSIS — R2681 Unsteadiness on feet: Secondary | ICD-10-CM | POA: Diagnosis not present

## 2022-04-04 DIAGNOSIS — R41840 Attention and concentration deficit: Secondary | ICD-10-CM | POA: Diagnosis not present

## 2022-04-04 DIAGNOSIS — R41841 Cognitive communication deficit: Secondary | ICD-10-CM | POA: Diagnosis not present

## 2022-04-04 DIAGNOSIS — R41842 Visuospatial deficit: Secondary | ICD-10-CM | POA: Diagnosis not present

## 2022-04-04 DIAGNOSIS — M6281 Muscle weakness (generalized): Secondary | ICD-10-CM | POA: Diagnosis not present

## 2022-04-04 NOTE — Patient Instructions (Signed)
Routine is a wonderful way to remember to do important things  Please return to taking your blood pressure medications in the kitchen  Maximize Nicholas Mckenzie's time with you: have her help with Nicholas Mckenzie's exercises, reminders, cooking, errands, etc

## 2022-04-04 NOTE — Therapy (Signed)
OUTPATIENT SPEECH LANGUAGE PATHOLOGY TREATMENT   Patient Name: Nicholas Mckenzie MRN: [id number] DOB:1945-12-06, 76 y.o., male Today's Date: 04/04/2022  PCP: [name], MD REFERRING PROVIDER: [name], MD   End of Session - 04/04/22 0802     Visit Number 8    Number of Visits 25    Date for SLP Re-Evaluation 05/25/22    Authorization Type [employer]    Progress Note Due on Visit 10    SLP Start Time 0802    SLP Stop Time  0845    SLP Time Calculation (min) 43 min    Activity Tolerance Patient tolerated treatment well                 Past Medical History:  Diagnosis Date   CKD (chronic kidney disease) stage 3, GFR 30-59 ml/min ([place])    Hypertension    Kidney stones    Sleep apnea    Past Surgical History:  Procedure Laterality Date   BLADDER STONE REMOVAL  12/2021   CATARACT EXTRACTION     IR NEPHROSTOMY EXCHANGE RIGHT  06/07/2021   IR NEPHROSTOMY EXCHANGE RIGHT  08/22/2021   IR NEPHROSTOMY PLACEMENT RIGHT  04/07/2021   IR PATIENT EVAL TECH 0-60 MINS  06/02/2021   kidney stent     MANDIBLE FRACTURE SURGERY     NEPHRECTOMY Right 01/07/2022   Patient Active Problem List   Diagnosis Date Noted   Hypernatremia 08/23/2021   Malnutrition of moderate degree 08/17/2021   Acute respiratory failure with hypoxia ([facility]) 08/16/2021   Severe sepsis ([place]) 08/16/2021   Sore throat 08/16/2021   Hx of subdural hematoma 08/16/2021   Nephrolithiasis 08/16/2021   Stage 3a chronic kidney disease (CKD) ([place]) 08/16/2021   Obesity (BMI 30-39.9) 08/16/2021   OSA (obstructive sleep apnea) 08/16/2021   Aspiration pneumonia ([place]) 08/15/2021   Snoring 08/08/2021   Frequent urination 06/07/2021   Acute lower UTI 06/07/2021   Ascending aortic aneurysm ([place]) 06/07/2021   Atrophy of right kidney 06/07/2021   Anemia 06/07/2021   Obesity (BMI 30.0-34.9) 06/07/2021   Subdural hematoma ([place]) 06/06/2021   COVID-19 virus infection 06/06/2021   Hypokalemia 06/06/2021   H/O  ischemic right PCA stroke 06/06/2021   History of cardioembolic cerebrovascular accident (CVA) 06/06/2021   Right-sided cerebrovascular accident (CVA) ([place]) 04/14/2021   Nonfunctioning kidney 04/14/2021   Goals of care, counseling/discussion 04/03/2021   Ureteral stent occlusion ([place]) 04/02/2021   CKD (chronic kidney disease) stage 3a 03/11/2021   Major depressive disorder, recurrent episode, in partial remission ([place]) 03/11/2021   Hypertension    Sleep apnea    GAD (generalized anxiety disorder) 07/20/2019    ONSET DATE: referral date 02/03/2022  REFERRING DIAG: R41.89 (ICD-10-CM) - Cognitive change  THERAPY DIAG:  Cognitive communication deficit  Rationale for Evaluation and Treatment Rehabilitation  SUBJECTIVE:   SUBJECTIVE STATEMENT: Report have found housekeeper and home health aide to A in home management  PAIN:  Are you having pain? No  OBJECTIVE:   TODAY'S TREATMENT:  04-04-22: Re-education on benefit of routine to aid in recall of desired tasks. Pt able to teach back with use of questioning cues. Generated tasks to optimize use of hired home aid with pt ID x2 ideas with mod-A. SLP led pt through moderately complex sequencing task of known activity (building garage) with pt ID x8 steps with min-A. Notable for comprehensiveness of generative sequence. SLP attempt to engage pt in mental flexibility task related to sequencing of well-known activity, with pt unable  to generate any unexpected events that may increase cost of building project. Deny novel problem solving and safety concerns at home.    03-28-22: Pt reports took medications in kitchen this morning. [name] had to verbally cue pt to go to kitchen to take meds. Did not recall to take supplements in fridge. Generated visual aid to pair with medications on counter to cue pt for supplements. [name] verbalizes understanding of visual aid use and need to verbally cue should Mckyle not attend to or execute next step in routine.  Re-education on value of using routine to establish new habits with faded cueing if able. Provided [name] and [name] with information re: in home aids. Provided handout with additional information. Per [name], x2 novel safety incidents over past day- walking through construction zone, to include muscling walker onto elevated concrete slab and rolling out of bed to try and get up off the floor vs usual way of getting out of bed. With consistent max-A, following direct instruction, pt able to ID x3 safety concerns related to these events. SLP ? comprehension of safety concerns and suspects continued reduced awareness of safety concerns despite thorough explanation of concern from SLP and spouse. Is able to generate alternative solutions to avoid presented safety concern with use of questioning cues.   03-26-22: Pt reports has not implemented novel medication routine. Some tension reported re: [place] doing more. Zen feels like he is "being babied," [name] reports decreased awareness impacting Ames's ability to do tasks he desired. E.g. Deryl will try and get water then leave a puddle and trail of water into living room. SLP assisted in generating alternative methods to complete desired activities- such as using up with lid, adding cup holder to walker. Pt with reduced awareness of limitation- reports he can clean up mess, then acknowledges he is unable to bend over to wipe up water with max-A. Reports wanting to do work on truck, telling SLP being on a stool or step ladder is ok. With max-A, verbalizes x2 safety concerns with completing work on [employer].    PATIENT EDUCATION: Education details: see above Person educated: Patient and Spouse Education method: Explanation, Demonstration, Verbal cues, and Handouts Education comprehension: verbalized understanding, returned demonstration, and needs further education   GOALS: Goals reviewed with patient? Yes  SHORT TERM GOALS: Target date: 03/30/2022  Pt will use  external aids to recall appointments with occasional min A from spouse over 1 week Baseline: 03/28/22 Goal status: MET  2.  Pt will use to do list or other memory aid to complete 2 household chores 5/7 days a week with occasional min A from spouse Baseline:  Goal status: NOT MET  3.  Pt will verbalize 3 safety concerns re: mobility and cognition and generate appropriate modification using trained meta-cognitive strategy with rare min A Baseline:  Goal status: NOT MET  4.  Pt will implement external aid to facilitate HEP completion per therapist recommendations over 1 week period with occasional min-A from spouse Baseline:  Goal status: NOT MET  5.  Pt will generate external aids (e.g. medication chart) to facilitate increased independence in medication management with usual mod-A  Baseline:  Goal status: NOT MET   LONG TERM GOALS: Target date: 05/25/2022  Pt will accurately sort medications into pill box with mod-I  Baseline:  Goal status: IN PROGRESS  2.  Pt will implement AM medication management routine resulting in adherence to medication administration over 1 week period with rare min-A from spouse Baseline:  Goal  status: IN PROGRESS  3.  Pt will complete 3 house hold chores 5/7 days with to do list or other external aid Baseline:  Goal status: IN PROGRESS  4.  Pt and spouse will report improved participation in household responsibilities using self-anchored rating scale Baseline:  Goal status: IN PROGRESS  ASSESSMENT:  CLINICAL IMPRESSION: Patient is a 76 y.o. M who was seen today for cognitive assessment for ongoing change in cognitive functioning s/p TIA Nove 2022. Pt presenting with mild to moderate impairment in areas of attention, memory, executive functioning, resulting in decreased participation in household management, preferred avocational activities and self-care. Currently spouse is managing household and providing A for all ADLs. Even with A, breakdowns  reported in medication and schedule management resulting in missed doses and appointments. Reduced QoL d/t limited participation in desired activities. I recommend skilled ST to maximize cognition and train in compensatory strategies for cognition for safety, to reduce caregiver burden and to improve pt's QoL.   OBJECTIVE IMPAIRMENTS include attention, memory, awareness, and executive functioning. These impairments are limiting patient from managing medications, managing appointments, managing finances, household responsibilities, and ADLs/IADLs. Factors affecting potential to achieve goals and functional outcome are ability to learn/carryover information, cooperation/participation level, and previous level of function.. Patient will benefit from skilled SLP services to address above impairments and improve overall function.  REHAB POTENTIAL: Good  PLAN: SLP FREQUENCY: 2x/week  SLP DURATION: 12 weeks  PLANNED INTERVENTIONS: Cueing hierachy, Cognitive reorganization, Internal/external aids, Functional tasks, SLP instruction and feedback, Compensatory strategies, and Patient/family education    Su Monks, CCC-SLP 04/04/2022, 8:02 AM

## 2022-04-06 ENCOUNTER — Ambulatory Visit: Payer: Medicare PPO | Admitting: Speech Pathology

## 2022-04-10 ENCOUNTER — Ambulatory Visit: Payer: Medicare PPO | Admitting: Speech Pathology

## 2022-04-10 ENCOUNTER — Encounter: Payer: Self-pay | Admitting: Occupational Therapy

## 2022-04-10 ENCOUNTER — Ambulatory Visit: Payer: Medicare PPO | Admitting: Occupational Therapy

## 2022-04-10 DIAGNOSIS — M6281 Muscle weakness (generalized): Secondary | ICD-10-CM | POA: Diagnosis not present

## 2022-04-10 DIAGNOSIS — R41842 Visuospatial deficit: Secondary | ICD-10-CM

## 2022-04-10 DIAGNOSIS — R41841 Cognitive communication deficit: Secondary | ICD-10-CM

## 2022-04-10 DIAGNOSIS — R41844 Frontal lobe and executive function deficit: Secondary | ICD-10-CM

## 2022-04-10 DIAGNOSIS — R2681 Unsteadiness on feet: Secondary | ICD-10-CM

## 2022-04-10 DIAGNOSIS — R41840 Attention and concentration deficit: Secondary | ICD-10-CM | POA: Diagnosis not present

## 2022-04-10 DIAGNOSIS — R278 Other lack of coordination: Secondary | ICD-10-CM

## 2022-04-10 NOTE — Therapy (Signed)
OUTPATIENT SPEECH LANGUAGE PATHOLOGY TREATMENT   Patient Name: Nicholas Mckenzie MRN: [id number] DOB:1945/07/07, 76 y.o., male Today's Date: 04/10/2022  PCP: [name], MD REFERRING PROVIDER: [name], MD   End of Session - 04/10/22 0808     Visit Number 9    Number of Visits 25    Date for SLP Re-Evaluation 05/25/22    Authorization Type [employer]    Progress Note Due on Visit 10    SLP Start Time 0808    SLP Stop Time  0845    SLP Time Calculation (min) 37 min    Activity Tolerance Patient tolerated treatment well                  Past Medical History:  Diagnosis Date   CKD (chronic kidney disease) stage 3, GFR 30-59 ml/min ([place])    Hypertension    Kidney stones    Sleep apnea    Past Surgical History:  Procedure Laterality Date   BLADDER STONE REMOVAL  12/2021   CATARACT EXTRACTION     IR NEPHROSTOMY EXCHANGE RIGHT  06/07/2021   IR NEPHROSTOMY EXCHANGE RIGHT  08/22/2021   IR NEPHROSTOMY PLACEMENT RIGHT  04/07/2021   IR PATIENT EVAL TECH 0-60 MINS  06/02/2021   kidney stent     MANDIBLE FRACTURE SURGERY     NEPHRECTOMY Right 01/07/2022   Patient Active Problem List   Diagnosis Date Noted   Hypernatremia 08/23/2021   Malnutrition of moderate degree 08/17/2021   Acute respiratory failure with hypoxia (HCC) 08/16/2021   Severe sepsis ([place]) 08/16/2021   Sore throat 08/16/2021   Hx of subdural hematoma 08/16/2021   Nephrolithiasis 08/16/2021   Stage 3a chronic kidney disease (CKD) ([place]) 08/16/2021   Obesity (BMI 30-39.9) 08/16/2021   OSA (obstructive sleep apnea) 08/16/2021   Aspiration pneumonia ([place]) 08/15/2021   Snoring 08/08/2021   Frequent urination 06/07/2021   Acute lower UTI 06/07/2021   Ascending aortic aneurysm ([place]) 06/07/2021   Atrophy of right kidney 06/07/2021   Anemia 06/07/2021   Obesity (BMI 30.0-34.9) 06/07/2021   Subdural hematoma ([place]) 06/06/2021   COVID-19 virus infection 06/06/2021   Hypokalemia 06/06/2021    H/O ischemic right PCA stroke 06/06/2021   History of cardioembolic cerebrovascular accident (CVA) 06/06/2021   Right-sided cerebrovascular accident (CVA) ([place]) 04/14/2021   Nonfunctioning kidney 04/14/2021   Goals of care, counseling/discussion 04/03/2021   Ureteral stent occlusion ([place]) 04/02/2021   CKD (chronic kidney disease) stage 3a 03/11/2021   Major depressive disorder, recurrent episode, in partial remission ([place]) 03/11/2021   Hypertension    Sleep apnea    GAD (generalized anxiety disorder) 07/20/2019    ONSET DATE: referral date 02/03/2022  REFERRING DIAG: R41.89 (ICD-10-CM) - Cognitive change  THERAPY DIAG:  Cognitive communication deficit  Rationale for Evaluation and Treatment Rehabilitation  SUBJECTIVE:   SUBJECTIVE STATEMENT: "Just hung around the house"   PAIN:  Are you having pain? No  OBJECTIVE:   TODAY'S TREATMENT:  04-10-22: Pt reports to increasing instances of taking blood pressure medication in the kitchen. Pt confirms is easier to recall now that he has established a routine. Has not installed cup holder to walker. Appears to continue not complete daily tasks at home, despite implementation of external aids. Pt reports he would like to find some tools, was waiting on someone to come help him. Tells SLP he'd like to get his grinder and welder to open and then fix his safe. SLP use of max-A questioning cues to  ID risks associated with desired task. SLP provides education on modifying previously enjoyed tasks to ones which are motivating and interesting to pt. Pt agreeable to complete complex lego set as alternative activity to work on attention, processing, following directions, and finding meaning in task completion. Pt and spouse appreciative of recommendation, will order set.   04-04-22: Re-education on benefit of routine to aid in recall of desired tasks. Pt able to teach back with use of questioning cues. Generated tasks to optimize use of hired home aid  with pt ID x2 ideas with mod-A. SLP led pt through moderately complex sequencing task of known activity (building garage) with pt ID x8 steps with min-A. Notable for comprehensiveness of generative sequence. SLP attempt to engage pt in mental flexibility task related to sequencing of well-known activity, with pt unable to generate any unexpected events that may increase cost of building project. Deny novel problem solving and safety concerns at home.    03-28-22: Pt reports took medications in kitchen this morning. [name] had to verbally cue pt to go to kitchen to take meds. Did not recall to take supplements in fridge. Generated visual aid to pair with medications on counter to cue pt for supplements. [name] verbalizes understanding of visual aid use and need to verbally cue should Prentice not attend to or execute next step in routine. Re-education on value of using routine to establish new habits with faded cueing if able. Provided [name] and [name] with information re: in home aids. Provided handout with additional information. Per [name], x2 novel safety incidents over past day- walking through construction zone, to include muscling walker onto elevated concrete slab and rolling out of bed to try and get up off the floor vs usual way of getting out of bed. With consistent max-A, following direct instruction, pt able to ID x3 safety concerns related to these events. SLP ? comprehension of safety concerns and suspects continued reduced awareness of safety concerns despite thorough explanation of concern from SLP and spouse. Is able to generate alternative solutions to avoid presented safety concern with use of questioning cues.   03-26-22: Pt reports has not implemented novel medication routine. Some tension reported re: Dierks doing more. Jerold feels like he is "being babied," [name] reports decreased awareness impacting Dimas's ability to do tasks he desired. E.g. Riyan will try and get water then leave a puddle and trail of  water into living room. SLP assisted in generating alternative methods to complete desired activities- such as using up with lid, adding cup holder to walker. Pt with reduced awareness of limitation- reports he can clean up mess, then acknowledges he is unable to bend over to wipe up water with max-A. Reports wanting to do work on truck, telling SLP being on a stool or step ladder is ok. With max-A, verbalizes x2 safety concerns with completing work on [employer].    PATIENT EDUCATION: Education details: see above Person educated: Patient and Spouse Education method: Explanation, Demonstration, Verbal cues, and Handouts Education comprehension: verbalized understanding, returned demonstration, and needs further education   GOALS: Goals reviewed with patient? Yes  SHORT TERM GOALS: Target date: 03/30/2022  Pt will use external aids to recall appointments with occasional min A from spouse over 1 week Baseline: 03/28/22 Goal status: MET  2.  Pt will use to do list or other memory aid to complete 2 household chores 5/7 days a week with occasional min A from spouse Baseline:  Goal status: NOT MET  3.  Pt will verbalize 3 safety concerns re: mobility and cognition and generate appropriate modification using trained meta-cognitive strategy with rare min A Baseline:  Goal status: NOT MET  4.  Pt will implement external aid to facilitate HEP completion per therapist recommendations over 1 week period with occasional min-A from spouse Baseline:  Goal status: NOT MET  5.  Pt will generate external aids (e.g. medication chart) to facilitate increased independence in medication management with usual mod-A  Baseline:  Goal status: NOT MET   LONG TERM GOALS: Target date: 05/25/2022  Pt will accurately sort medications into pill box with mod-I  Baseline:  Goal status: IN PROGRESS  2.  Pt will implement AM medication management routine resulting in adherence to medication administration  over 1 week period with rare min-A from spouse Baseline:  Goal status: IN PROGRESS  3.  Pt will complete 3 house hold chores 5/7 days with to do list or other external aid Baseline:  Goal status: IN PROGRESS  4.  Pt and spouse will report improved participation in household responsibilities using self-anchored rating scale Baseline:  Goal status: IN PROGRESS  ASSESSMENT:  CLINICAL IMPRESSION: Patient is a 76 y.o. M who was seen today for cognitive assessment for ongoing change in cognitive functioning s/p TIA Nove 2022. Pt presenting with mild to moderate impairment in areas of attention, memory, executive functioning, resulting in decreased participation in household management, preferred avocational activities and self-care. Currently spouse is managing household and providing A for all ADLs. Even with A, breakdowns reported in medication and schedule management resulting in missed doses and appointments. Reduced QoL d/t limited participation in desired activities. I recommend skilled ST to maximize cognition and train in compensatory strategies for cognition for safety, to reduce caregiver burden and to improve pt's QoL.   OBJECTIVE IMPAIRMENTS include attention, memory, awareness, and executive functioning. These impairments are limiting patient from managing medications, managing appointments, managing finances, household responsibilities, and ADLs/IADLs. Factors affecting potential to achieve goals and functional outcome are ability to learn/carryover information, cooperation/participation level, and previous level of function.. Patient will benefit from skilled SLP services to address above impairments and improve overall function.  REHAB POTENTIAL: Good  PLAN: SLP FREQUENCY: 2x/week  SLP DURATION: 12 weeks  PLANNED INTERVENTIONS: Cueing hierachy, Cognitive reorganization, Internal/external aids, Functional tasks, SLP instruction and feedback, Compensatory strategies, and  Patient/family education    Su Monks, CCC-SLP 04/10/2022, 8:08 AM
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
## 2022-04-12 ENCOUNTER — Ambulatory Visit: Payer: Medicare PPO | Admitting: Speech Pathology

## 2022-04-12 ENCOUNTER — Encounter: Payer: Self-pay | Admitting: Occupational Therapy

## 2022-04-12 ENCOUNTER — Ambulatory Visit: Payer: Medicare PPO | Admitting: Occupational Therapy

## 2022-04-12 DIAGNOSIS — R41840 Attention and concentration deficit: Secondary | ICD-10-CM | POA: Diagnosis not present

## 2022-04-12 DIAGNOSIS — R41841 Cognitive communication deficit: Secondary | ICD-10-CM | POA: Diagnosis not present

## 2022-04-12 DIAGNOSIS — R41844 Frontal lobe and executive function deficit: Secondary | ICD-10-CM

## 2022-04-12 DIAGNOSIS — R41842 Visuospatial deficit: Secondary | ICD-10-CM

## 2022-04-12 DIAGNOSIS — R278 Other lack of coordination: Secondary | ICD-10-CM | POA: Diagnosis not present

## 2022-04-12 DIAGNOSIS — M6281 Muscle weakness (generalized): Secondary | ICD-10-CM | POA: Diagnosis not present

## 2022-04-12 DIAGNOSIS — R2681 Unsteadiness on feet: Secondary | ICD-10-CM | POA: Diagnosis not present
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
## 2022-04-16 ENCOUNTER — Ambulatory Visit: Payer: Medicare PPO | Admitting: Speech Pathology

## 2022-04-16 DIAGNOSIS — R41840 Attention and concentration deficit: Secondary | ICD-10-CM | POA: Diagnosis not present

## 2022-04-16 DIAGNOSIS — R41841 Cognitive communication deficit: Secondary | ICD-10-CM

## 2022-04-16 DIAGNOSIS — M6281 Muscle weakness (generalized): Secondary | ICD-10-CM | POA: Diagnosis not present

## 2022-04-16 DIAGNOSIS — R41842 Visuospatial deficit: Secondary | ICD-10-CM | POA: Diagnosis not present

## 2022-04-16 DIAGNOSIS — R41844 Frontal lobe and executive function deficit: Secondary | ICD-10-CM | POA: Diagnosis not present

## 2022-04-16 DIAGNOSIS — R278 Other lack of coordination: Secondary | ICD-10-CM | POA: Diagnosis not present

## 2022-04-16 DIAGNOSIS — R2681 Unsteadiness on feet: Secondary | ICD-10-CM | POA: Diagnosis not present

## 2022-04-16 NOTE — Patient Instructions (Signed)
Monday to-do:  Blood pressure medications + fridge supplements  Find hearing aid, clean and don  Place hearing aid case on shelf next to bed where you typically put hearing aids so tonight when you doff your hearing aids you can place them in the case.   Using your hearing aid case will make it easier to grab your hearing aids and go, will reduce the likelihood that one will get knocked on the floor, and help keep them free from dust.

## 2022-04-19 ENCOUNTER — Ambulatory Visit: Payer: Medicare PPO | Admitting: Occupational Therapy

## 2022-04-19 ENCOUNTER — Ambulatory Visit: Payer: Medicare PPO | Admitting: Speech Pathology

## 2022-04-19 ENCOUNTER — Encounter: Payer: Self-pay | Admitting: Occupational Therapy

## 2022-04-19 DIAGNOSIS — R41844 Frontal lobe and executive function deficit: Secondary | ICD-10-CM | POA: Diagnosis not present

## 2022-04-19 DIAGNOSIS — R278 Other lack of coordination: Secondary | ICD-10-CM

## 2022-04-19 DIAGNOSIS — R41841 Cognitive communication deficit: Secondary | ICD-10-CM

## 2022-04-19 DIAGNOSIS — M6281 Muscle weakness (generalized): Secondary | ICD-10-CM | POA: Diagnosis not present

## 2022-04-19 DIAGNOSIS — R41842 Visuospatial deficit: Secondary | ICD-10-CM

## 2022-04-19 DIAGNOSIS — R41840 Attention and concentration deficit: Secondary | ICD-10-CM | POA: Diagnosis not present

## 2022-04-19 DIAGNOSIS — R2681 Unsteadiness on feet: Secondary | ICD-10-CM

## 2022-04-19 NOTE — Patient Instructions (Signed)
Getting out the bed the right way: errorless learning  You need to practice getting out of bed with both feet on the ground to avoid falls  Have your aide assist with this. Practice for 30 minutes a day over a week period, until you can do it many times in a row without verbal cues from [place] or [name]  You want to make sure Nicholas Mckenzie is doing it correctly each and every time. So he may need verbal cues to make sure feet are both on floor, using walker, taking time. Only give verbal or tactile cues as needed.    Why?! To optimize your safety. You have fallen out of bed numerous times over the past few weeks.
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
## 2022-04-19 NOTE — Therapy (Signed)
OUTPATIENT SPEECH LANGUAGE PATHOLOGY TREATMENT   Patient Name: Nicholas Mckenzie MRN: [id number] DOB:[birth year], 76 y.o., male Today's Date: 04/19/2022  PCP: [name], MD REFERRING PROVIDER: [name], MD   End of Session - 04/19/22 0852     Visit Number 12    Number of Visits 25    Date for SLP Re-Evaluation 05/25/22    Authorization Type [employer]    Progress Note Due on Visit 10    SLP Start Time 0849    SLP Stop Time  0930    SLP Time Calculation (min) 41 min    Activity Tolerance Patient tolerated treatment well                    Past Medical History:  Diagnosis Date   CKD (chronic kidney disease) stage 3, GFR 30-59 ml/min ([place])    Hypertension    Kidney stones    Sleep apnea    Past Surgical History:  Procedure Laterality Date   BLADDER STONE REMOVAL  12/2021   CATARACT EXTRACTION     IR NEPHROSTOMY EXCHANGE RIGHT  06/07/2021   IR NEPHROSTOMY EXCHANGE RIGHT  08/22/2021   IR NEPHROSTOMY PLACEMENT RIGHT  04/07/2021   IR PATIENT EVAL TECH 0-60 MINS  06/02/2021   kidney stent     MANDIBLE FRACTURE SURGERY     NEPHRECTOMY Right 01/07/2022   Patient Active Problem List   Diagnosis Date Noted   Hypernatremia 08/23/2021   Malnutrition of moderate degree 08/17/2021   Acute respiratory failure with hypoxia ([place]) 08/16/2021   Severe sepsis ([place]) 08/16/2021   Sore throat 08/16/2021   Hx of subdural hematoma 08/16/2021   Nephrolithiasis 08/16/2021   Stage 3a chronic kidney disease (CKD) ([place]) 08/16/2021   Obesity (BMI 30-39.9) 08/16/2021   OSA (obstructive sleep apnea) 08/16/2021   Aspiration pneumonia ([place]) 08/15/2021   Snoring 08/08/2021   Frequent urination 06/07/2021   Acute lower UTI 06/07/2021   Ascending aortic aneurysm ([place]) 06/07/2021   Atrophy of right kidney 06/07/2021   Anemia 06/07/2021   Obesity (BMI 30.0-34.9) 06/07/2021   Subdural hematoma ([place]) 06/06/2021   COVID-19 virus infection 06/06/2021   Hypokalemia 06/06/2021    H/O ischemic right PCA stroke 06/06/2021   History of cardioembolic cerebrovascular accident (CVA) 06/06/2021   Right-sided cerebrovascular accident (CVA) ([place]) 04/14/2021   Nonfunctioning kidney 04/14/2021   Goals of care, counseling/discussion 04/03/2021   Ureteral stent occlusion ([place]) 04/02/2021   CKD (chronic kidney disease) stage 3a 03/11/2021   Major depressive disorder, recurrent episode, in partial remission ([place]) 03/11/2021   Hypertension    Sleep apnea    GAD (generalized anxiety disorder) 07/20/2019    ONSET DATE: referral date 02/03/2022  REFERRING DIAG: R41.89 (ICD-10-CM) - Cognitive change  THERAPY DIAG:  Cognitive communication deficit  Rationale for Evaluation and Treatment Rehabilitation  SUBJECTIVE:   SUBJECTIVE STATEMENT: Pt had two falls, one getting out of bed and one reaching for something without walker  PAIN:  Are you having pain? No   OBJECTIVE:   TODAY'S TREATMENT:  04-19-22: Spouse abe to ID how pt is able to safely get up from bed, tells SLP she reminds pt of this. SLP educated on pt's current cognitive impairments as barrier to recall this information in the moment. SLP advised on how to incorporate errorless learning at home to leverage procedural memory for improved safety when exiting bed. SLP facilitated discussion on maintaining awareness of where hearing aids are. Generated alternative strategy of keeping hearing aid  box with visual aid in walker to provide pt opportunity to generate habit of keeping HA with him, regardless of where/when he doffs. Discussion on lack of progress demonstrated in therapy. 2 additional visits schedule to allow for SLP to support pt and spouse in generating strategies to optimize safety in home and generate reasonable goals based on pt's prioritizes and cognitive functioning. Pt and spouse verbalize agreement.   04-13-22: Pt without hearing aids this am, reaches in pocket and produces R hearing aid. Required mod-A  to determine need to change battery. Education provided on strategy of using case to contain aids at bedtime to assist in not losing one. Additional strategy of tossing old batteries to facilitate decreased chance of inputting drained battery. Pt is able to change battery by self and place hearing aid. Pt tells SLP he did not take blood pressure medications this AM, d/t concern re: hearing aids. With questioning, pt able to ID importance of med administration compliance and how long he needs to take x2 meds in AM. SLP and pt collaborated to generate to-do list today to aid in completion of tasks discussed.   04-12-22: Reviewed medication administration routine. Education on having moved BP medication into kitchen to aid in ability to take refrigerated supplements as well. Pt verbalizes understanding. Pt continues to present with reduced awareness of physical and cognitive deficits impacting ability to safely perform desired activities. Tells SLP he'd like to change motor in car. Unable to ID that he is unable to lift himself from the ground, despite having required A from spouse x3 times to get off floor in past few weeks. Target safety awareness using photo cards with pt able to ID safety hazard 4/4 trials and generate consequence in 4/4 trials. SLP facilitated discussion on how these scenarios present in his own home. Discussion on negative consequences associated with further injuries in recovery process.   04-10-22: Pt reports to increasing instances of taking blood pressure medication in the kitchen. Pt confirms is easier to recall now that he has established a routine. Has not installed cup holder to walker. Appears to continue not complete daily tasks at home, despite implementation of external aids. Pt reports he would like to find some tools, was waiting on someone to come help him. Tells SLP he'd like to get his grinder and welder to open and then fix his safe. SLP use of max-A questioning cues to ID  risks associated with desired task. SLP provides education on modifying previously enjoyed tasks to ones which are motivating and interesting to pt. Pt agreeable to complete complex lego set as alternative activity to work on attention, processing, following directions, and finding meaning in task completion. Pt and spouse appreciative of recommendation, will order set.   04-04-22: Re-education on benefit of routine to aid in recall of desired tasks. Pt able to teach back with use of questioning cues. Generated tasks to optimize use of hired home aid with pt ID x2 ideas with mod-A. SLP led pt through moderately complex sequencing task of known activity (building garage) with pt ID x8 steps with min-A. Notable for comprehensiveness of generative sequence. SLP attempt to engage pt in mental flexibility task related to sequencing of well-known activity, with pt unable to generate any unexpected events that may increase cost of building project. Deny novel problem solving and safety concerns at home.    03-28-22: Pt reports took medications in kitchen this morning. [name] had to verbally cue pt to go to kitchen to take meds.  Did not recall to take supplements in fridge. Generated visual aid to pair with medications on counter to cue pt for supplements. [name] verbalizes understanding of visual aid use and need to verbally cue should Jatorian not attend to or execute next step in routine. Re-education on value of using routine to establish new habits with faded cueing if able. Provided [name] and [name] with information re: in home aids. Provided handout with additional information. Per [name], x2 novel safety incidents over past day- walking through construction zone, to include muscling walker onto elevated concrete slab and rolling out of bed to try and get up off the floor vs usual way of getting out of bed. With consistent max-A, following direct instruction, pt able to ID x3 safety concerns related to these events. SLP ?  comprehension of safety concerns and suspects continued reduced awareness of safety concerns despite thorough explanation of concern from SLP and spouse. Is able to generate alternative solutions to avoid presented safety concern with use of questioning cues.   03-26-22: Pt reports has not implemented novel medication routine. Some tension reported re: Pantelis doing more. Raekwon feels like he is "being babied," [name] reports decreased awareness impacting Tranquilino's ability to do tasks he desired. E.g. Danen will try and get water then leave a puddle and trail of water into living room. SLP assisted in generating alternative methods to complete desired activities- such as using up with lid, adding cup holder to walker. Pt with reduced awareness of limitation- reports he can clean up mess, then acknowledges he is unable to bend over to wipe up water with max-A. Reports wanting to do work on truck, telling SLP being on a stool or step ladder is ok. With max-A, verbalizes x2 safety concerns with completing work on [employer].    PATIENT EDUCATION: Education details: see above Person educated: Patient and Spouse Education method: Explanation, Demonstration, Verbal cues, and Handouts Education comprehension: verbalized understanding, returned demonstration, and needs further education   GOALS: Goals reviewed with patient? Yes  SHORT TERM GOALS: Target date: 03/30/2022  Pt will use external aids to recall appointments with occasional min A from spouse over 1 week Baseline: 03/28/22 Goal status: MET  2.  Pt will use to do list or other memory aid to complete 2 household chores 5/7 days a week with occasional min A from spouse Baseline:  Goal status: NOT MET  3.  Pt will verbalize 3 safety concerns re: mobility and cognition and generate appropriate modification using trained meta-cognitive strategy with rare min A Baseline:  Goal status: NOT MET  4.  Pt will implement external aid to facilitate HEP  completion per therapist recommendations over 1 week period with occasional min-A from spouse Baseline:  Goal status: NOT MET  5.  Pt will generate external aids (e.g. medication chart) to facilitate increased independence in medication management with usual mod-A  Baseline:  Goal status: NOT MET   LONG TERM GOALS: Target date: 05/25/2022  Pt will accurately sort medications into pill box with mod-I  Baseline:  Goal status: IN PROGRESS  2.  Pt will implement AM medication management routine resulting in adherence to medication administration over 1 week period with rare min-A from spouse Baseline:  Goal status: IN PROGRESS  3.  Pt will complete 3 house hold chores 5/7 days with to do list or other external aid Baseline:  Goal status: IN PROGRESS  4.  Pt and spouse will report improved participation in household responsibilities using self-anchored rating scale  Baseline:  Goal status: IN PROGRESS  ASSESSMENT:  CLINICAL IMPRESSION: Patient is a 76 y.o. M who was seen today for cognitive assessment for ongoing change in cognitive functioning s/p TIA Nove 2022. Pt presenting with mild to moderate impairment in areas of attention, memory, executive functioning, resulting in decreased participation in household management, preferred avocational activities and self-care. Currently spouse is managing household and providing A for all ADLs. Even with A, breakdowns reported in medication and schedule management resulting in missed doses and appointments. Reduced QoL d/t limited participation in desired activities. I recommend skilled ST to maximize cognition and train in compensatory strategies for cognition for safety, to reduce caregiver burden and to improve pt's QoL.   OBJECTIVE IMPAIRMENTS include attention, memory, awareness, and executive functioning. These impairments are limiting patient from managing medications, managing appointments, managing finances, household responsibilities, and  ADLs/IADLs. Factors affecting potential to achieve goals and functional outcome are ability to learn/carryover information, cooperation/participation level, and previous level of function.. Patient will benefit from skilled SLP services to address above impairments and improve overall function.  REHAB POTENTIAL: Good  PLAN: SLP FREQUENCY: 2x/week  SLP DURATION: 12 weeks  PLANNED INTERVENTIONS: Cueing hierachy, Cognitive reorganization, Internal/external aids, Functional tasks, SLP instruction and feedback, Compensatory strategies, and Patient/family education    Su Monks, CCC-SLP 04/19/2022, 8:55 AM

## 2022-04-25 ENCOUNTER — Ambulatory Visit: Payer: Medicare PPO | Attending: Internal Medicine | Admitting: Occupational Therapy

## 2022-04-25 DIAGNOSIS — M6281 Muscle weakness (generalized): Secondary | ICD-10-CM | POA: Diagnosis not present

## 2022-04-25 DIAGNOSIS — R278 Other lack of coordination: Secondary | ICD-10-CM

## 2022-04-25 DIAGNOSIS — R41842 Visuospatial deficit: Secondary | ICD-10-CM

## 2022-04-25 DIAGNOSIS — R41844 Frontal lobe and executive function deficit: Secondary | ICD-10-CM

## 2022-04-25 DIAGNOSIS — R2689 Other abnormalities of gait and mobility: Secondary | ICD-10-CM | POA: Diagnosis not present

## 2022-04-25 DIAGNOSIS — R2681 Unsteadiness on feet: Secondary | ICD-10-CM | POA: Diagnosis not present

## 2022-04-25 DIAGNOSIS — R41840 Attention and concentration deficit: Secondary | ICD-10-CM | POA: Diagnosis not present

## 2022-04-25 DIAGNOSIS — R41841 Cognitive communication deficit: Secondary | ICD-10-CM | POA: Insufficient documentation
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
## 2022-04-27 ENCOUNTER — Ambulatory Visit: Payer: Medicare PPO | Admitting: Occupational Therapy

## 2022-04-27 DIAGNOSIS — R41842 Visuospatial deficit: Secondary | ICD-10-CM | POA: Diagnosis not present

## 2022-04-27 DIAGNOSIS — R2681 Unsteadiness on feet: Secondary | ICD-10-CM

## 2022-04-27 DIAGNOSIS — M6281 Muscle weakness (generalized): Secondary | ICD-10-CM | POA: Diagnosis not present

## 2022-04-27 DIAGNOSIS — R41844 Frontal lobe and executive function deficit: Secondary | ICD-10-CM

## 2022-04-27 DIAGNOSIS — R41841 Cognitive communication deficit: Secondary | ICD-10-CM | POA: Diagnosis not present

## 2022-04-27 DIAGNOSIS — R278 Other lack of coordination: Secondary | ICD-10-CM

## 2022-04-27 DIAGNOSIS — R41840 Attention and concentration deficit: Secondary | ICD-10-CM

## 2022-04-27 DIAGNOSIS — R2689 Other abnormalities of gait and mobility: Secondary | ICD-10-CM | POA: Diagnosis not present
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
## 2022-04-30 ENCOUNTER — Ambulatory Visit: Payer: Medicare PPO | Admitting: Occupational Therapy

## 2022-04-30 ENCOUNTER — Encounter: Payer: Self-pay | Admitting: Occupational Therapy

## 2022-04-30 DIAGNOSIS — R41840 Attention and concentration deficit: Secondary | ICD-10-CM

## 2022-04-30 DIAGNOSIS — R41841 Cognitive communication deficit: Secondary | ICD-10-CM | POA: Diagnosis not present

## 2022-04-30 DIAGNOSIS — R278 Other lack of coordination: Secondary | ICD-10-CM | POA: Diagnosis not present

## 2022-04-30 DIAGNOSIS — R41842 Visuospatial deficit: Secondary | ICD-10-CM

## 2022-04-30 DIAGNOSIS — R41844 Frontal lobe and executive function deficit: Secondary | ICD-10-CM | POA: Diagnosis not present

## 2022-04-30 DIAGNOSIS — R2689 Other abnormalities of gait and mobility: Secondary | ICD-10-CM | POA: Diagnosis not present

## 2022-04-30 DIAGNOSIS — R2681 Unsteadiness on feet: Secondary | ICD-10-CM

## 2022-04-30 DIAGNOSIS — M6281 Muscle weakness (generalized): Secondary | ICD-10-CM | POA: Diagnosis not present
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
## 2022-05-01 NOTE — Therapy (Unsigned)
OUTPATIENT SPEECH LANGUAGE PATHOLOGY TREATMENT   Patient Name: Nicholas Mckenzie MRN: [id number] DOB:[birth year], 76 y.o., male Today's Date: 05/02/2022  PCP: [name], MD REFERRING PROVIDER: [name], MD   End of Session - 05/02/22 0845     Visit Number 13    Number of Visits 25    Date for SLP Re-Evaluation 05/25/22    Authorization Type [employer]    Progress Note Due on Visit 10    SLP Start Time 0845    SLP Stop Time  0927    SLP Time Calculation (min) 42 min    Activity Tolerance Patient tolerated treatment well                     Past Medical History:  Diagnosis Date   CKD (chronic kidney disease) stage 3, GFR 30-59 ml/min ([place])    Hypertension    Kidney stones    Sleep apnea    Past Surgical History:  Procedure Laterality Date   BLADDER STONE REMOVAL  12/2021   CATARACT EXTRACTION     IR NEPHROSTOMY EXCHANGE RIGHT  06/07/2021   IR NEPHROSTOMY EXCHANGE RIGHT  08/22/2021   IR NEPHROSTOMY PLACEMENT RIGHT  04/07/2021   IR PATIENT EVAL TECH 0-60 MINS  06/02/2021   kidney stent     MANDIBLE FRACTURE SURGERY     NEPHRECTOMY Right 01/07/2022   Patient Active Problem List   Diagnosis Date Noted   Hypernatremia 08/23/2021   Malnutrition of moderate degree 08/17/2021   Acute respiratory failure with hypoxia ([place]) 08/16/2021   Severe sepsis ([place]) 08/16/2021   Sore throat 08/16/2021   Hx of subdural hematoma 08/16/2021   Nephrolithiasis 08/16/2021   Stage 3a chronic kidney disease (CKD) ([place]) 08/16/2021   Obesity (BMI 30-39.9) 08/16/2021   OSA (obstructive sleep apnea) 08/16/2021   Aspiration pneumonia ([place]) 08/15/2021   Snoring 08/08/2021   Frequent urination 06/07/2021   Acute lower UTI 06/07/2021   Ascending aortic aneurysm ([place]) 06/07/2021   Atrophy of right kidney 06/07/2021   Anemia 06/07/2021   Obesity (BMI 30.0-34.9) 06/07/2021   Subdural hematoma ([place]) 06/06/2021   COVID-19 virus infection 06/06/2021   Hypokalemia 06/06/2021    H/O ischemic right PCA stroke 06/06/2021   History of cardioembolic cerebrovascular accident (CVA) 06/06/2021   Right-sided cerebrovascular accident (CVA) ([place]) 04/14/2021   Nonfunctioning kidney 04/14/2021   Goals of care, counseling/discussion 04/03/2021   Ureteral stent occlusion ([place]) 04/02/2021   CKD (chronic kidney disease) stage 3a 03/11/2021   Major depressive disorder, recurrent episode, in partial remission ([place]) 03/11/2021   Hypertension    Sleep apnea    GAD (generalized anxiety disorder) 07/20/2019    ONSET DATE: referral date 02/03/2022  REFERRING DIAG: R41.89 (ICD-10-CM) - Cognitive change  THERAPY DIAG:  Cognitive communication deficit  Rationale for Evaluation and Treatment Rehabilitation  SUBJECTIVE:   SUBJECTIVE STATEMENT: "I'm trying to take care of picking up after himself"   PAIN:  Are you having pain? No   OBJECTIVE:   TODAY'S TREATMENT:  05-02-22: Generated strategies to aid in establishment of habits as external memory aid for improved/increased participation in home based activities. Pt agreeable to verbal cues from spouse and caregiver to aid in initial recall of desired tasks. Discussion on re-framing from "nagging" to helping, as pt is requesting this assistance. Education provided on benefit of creating habits and routine to aid in recall of required tasks. Pt verbalizes understanding. Report pt as started complex lego set and is demonstrating planning,  attention, following directions, and is enjoying activity.   PATIENT EDUCATION: Education details: see above Person educated: Patient and Spouse Education method: Explanation, Demonstration, Verbal cues, and Handouts Education comprehension: verbalized understanding, returned demonstration, and needs further education   GOALS: Goals reviewed with patient? Yes  SHORT TERM GOALS: Target date: 03/30/2022  Pt will use external aids to recall appointments with occasional min A from spouse over 1  week Baseline: 03/28/22 Goal status: MET  2.  Pt will use to do list or other memory aid to complete 2 household chores 5/7 days a week with occasional min A from spouse Baseline:  Goal status: NOT MET  3.  Pt will verbalize 3 safety concerns re: mobility and cognition and generate appropriate modification using trained meta-cognitive strategy with rare min A Baseline:  Goal status: NOT MET  4.  Pt will implement external aid to facilitate HEP completion per therapist recommendations over 1 week period with occasional min-A from spouse Baseline:  Goal status: NOT MET  5.  Pt will generate external aids (e.g. medication chart) to facilitate increased independence in medication management with usual mod-A  Baseline:  Goal status: NOT MET   LONG TERM GOALS: Target date: 05/25/2022  Pt will accurately sort medications into pill box with mod-I  Baseline:  Goal status: IN PROGRESS  2.  Pt will implement AM medication management routine resulting in adherence to medication administration over 1 week period with rare min-A from spouse Baseline:  Goal status: IN PROGRESS  3.  Pt will complete 3 house hold chores 5/7 days with to do list or other external aid Baseline:  Goal status: IN PROGRESS  4.  Pt and spouse will report improved participation in household responsibilities using self-anchored rating scale Baseline:  Goal status: IN PROGRESS  ASSESSMENT:  CLINICAL IMPRESSION: Patient is a 76 y.o. M who was seen today for cognitive assessment for ongoing change in cognitive functioning s/p TIA Nove 2022. Pt presenting with mild to moderate impairment in areas of attention, memory, executive functioning, resulting in decreased participation in household management, preferred avocational activities and self-care. Currently spouse is managing household and providing A for all ADLs. Even with A, breakdowns reported in medication and schedule management resulting in missed doses and  appointments. Reduced QoL d/t limited participation in desired activities. I recommend skilled ST to maximize cognition and train in compensatory strategies for cognition for safety, to reduce caregiver burden and to improve pt's QoL.   OBJECTIVE IMPAIRMENTS include attention, memory, awareness, and executive functioning. These impairments are limiting patient from managing medications, managing appointments, managing finances, household responsibilities, and ADLs/IADLs. Factors affecting potential to achieve goals and functional outcome are ability to learn/carryover information, cooperation/participation level, and previous level of function.. Patient will benefit from skilled SLP services to address above impairments and improve overall function.  REHAB POTENTIAL: Good  PLAN: SLP FREQUENCY: 2x/week  SLP DURATION: 12 weeks  PLANNED INTERVENTIONS: Cueing hierachy, Cognitive reorganization, Internal/external aids, Functional tasks, SLP instruction and feedback, Compensatory strategies, and Patient/family education   Su Monks, CCC-SLP 05/02/2022, 2:09 PM

## 2022-05-02 ENCOUNTER — Ambulatory Visit: Payer: Medicare PPO | Admitting: Occupational Therapy

## 2022-05-02 ENCOUNTER — Ambulatory Visit: Payer: Medicare PPO | Admitting: Speech Pathology

## 2022-05-02 ENCOUNTER — Encounter: Payer: Self-pay | Admitting: Occupational Therapy

## 2022-05-02 VITALS — BP 144/75

## 2022-05-02 DIAGNOSIS — R278 Other lack of coordination: Secondary | ICD-10-CM

## 2022-05-02 DIAGNOSIS — R41840 Attention and concentration deficit: Secondary | ICD-10-CM

## 2022-05-02 DIAGNOSIS — R41841 Cognitive communication deficit: Secondary | ICD-10-CM | POA: Diagnosis not present

## 2022-05-02 DIAGNOSIS — R2681 Unsteadiness on feet: Secondary | ICD-10-CM | POA: Diagnosis not present

## 2022-05-02 DIAGNOSIS — R41844 Frontal lobe and executive function deficit: Secondary | ICD-10-CM | POA: Diagnosis not present

## 2022-05-02 DIAGNOSIS — R41842 Visuospatial deficit: Secondary | ICD-10-CM

## 2022-05-02 DIAGNOSIS — R2689 Other abnormalities of gait and mobility: Secondary | ICD-10-CM | POA: Diagnosis not present

## 2022-05-02 DIAGNOSIS — M6281 Muscle weakness (generalized): Secondary | ICD-10-CM | POA: Diagnosis not present

## 2022-05-02 NOTE — Patient Instructions (Signed)
1. Grip Strengthening (Resistive Putty)   Squeeze putty using thumb and all fingers. Repeat _20___ times. Do __2__ sessions per day.   2. Roll putty into tube on table and pinch between first two fingers and thumb x 10 reps. Do 2 sessions per day     Copyright  VHI. All rights reserved.     
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
## 2022-05-03 NOTE — Therapy (Signed)
OUTPATIENT SPEECH LANGUAGE PATHOLOGY TREATMENT   Patient Name: Nicholas Mckenzie MRN: [id number] DOB:1945-11-09, 76 y.o., male Today's Date: 05/07/2022  PCP: [name], MD REFERRING PROVIDER: [name], MD   End of Session - 05/07/22 0928     Visit Number 14    Number of Visits 25    Date for SLP Re-Evaluation 05/25/22    Authorization Type [employer]    Progress Note Due on Visit 10    SLP Start Time [phone]    SLP Stop Time  1000    SLP Time Calculation (min) 32 min    Activity Tolerance Patient tolerated treatment well                      Past Medical History:  Diagnosis Date   CKD (chronic kidney disease) stage 3, GFR 30-59 ml/min ([place])    Hypertension    Kidney stones    Sleep apnea    Past Surgical History:  Procedure Laterality Date   BLADDER STONE REMOVAL  12/2021   CATARACT EXTRACTION     IR NEPHROSTOMY EXCHANGE RIGHT  06/07/2021   IR NEPHROSTOMY EXCHANGE RIGHT  08/22/2021   IR NEPHROSTOMY PLACEMENT RIGHT  04/07/2021   IR PATIENT EVAL TECH 0-60 MINS  06/02/2021   kidney stent     MANDIBLE FRACTURE SURGERY     NEPHRECTOMY Right 01/07/2022   Patient Active Problem List   Diagnosis Date Noted   Hypernatremia 08/23/2021   Malnutrition of moderate degree 08/17/2021   Acute respiratory failure with hypoxia ([place]) 08/16/2021   Severe sepsis ([place]) 08/16/2021   Sore throat 08/16/2021   Hx of subdural hematoma 08/16/2021   Nephrolithiasis 08/16/2021   Stage 3a chronic kidney disease (CKD) ([place]) 08/16/2021   Obesity (BMI 30-39.9) 08/16/2021   OSA (obstructive sleep apnea) 08/16/2021   Aspiration pneumonia ([place]) 08/15/2021   Snoring 08/08/2021   Frequent urination 06/07/2021   Acute lower UTI 06/07/2021   Ascending aortic aneurysm ([place]) 06/07/2021   Atrophy of right kidney 06/07/2021   Anemia 06/07/2021   Obesity (BMI 30.0-34.9) 06/07/2021   Subdural hematoma ([place]) 06/06/2021   COVID-19 virus infection 06/06/2021   Hypokalemia  06/06/2021   H/O ischemic right PCA stroke 06/06/2021   History of cardioembolic cerebrovascular accident (CVA) 06/06/2021   Right-sided cerebrovascular accident (CVA) ([place]) 04/14/2021   Nonfunctioning kidney 04/14/2021   Goals of care, counseling/discussion 04/03/2021   Ureteral stent occlusion ([place]) 04/02/2021   CKD (chronic kidney disease) stage 3a 03/11/2021   Major depressive disorder, recurrent episode, in partial remission ([place]) 03/11/2021   Hypertension    Sleep apnea    GAD (generalized anxiety disorder) 07/20/2019    ONSET DATE: referral date 02/03/2022  REFERRING DIAG: R41.89 (ICD-10-CM) - Cognitive change  THERAPY DIAG:  Cognitive communication deficit  Rationale for Evaluation and Treatment Rehabilitation  SUBJECTIVE:   SUBJECTIVE STATEMENT: "I'm still working on my corvette"   PAIN:  Are you having pain? No  SPEECH THERAPY DISCHARGE SUMMARY  Visits from Start of Care: 14  Current functional level related to goals / functional outcomes: Mild improvement in attention, processing, problem solving. Mild improvement in increased independence in AM medication administration.    Remaining deficits: Cognitive deficits in areas of: attention, memory, awareness, and executive functioning.    Education / Equipment: External cognitive aids, internal and external memory strategies, cueing hierarchy for spouse, HEP   Patient agrees to discharge. Patient goals were partially met. Patient is being discharged due to maximized  rehab potential.    OBJECTIVE:   TODAY'S TREATMENT:  05/07/22: Pt reports over the weekend to having this intense feeling that he had a big job to complete in [place]. Was thinking he needed to fly there and rent equipment. Tells SLP he was aware this wasn't reality. Additional report from spouse that pt bathed this past Friday fully clothed. SLP provided education on strategies to aid in orientation and recovery from instances of confusion. Recommend  use of gentle redirection and avoiding arguments. Provided education on ongoing HEP to support ongoing improvement s/p d/c from [place]. Spouse verbalizes appreciation for services, agreeable to d/c.  PATIENT EDUCATION: Education details: see above Person educated: Patient and Spouse Education method: Explanation, Demonstration, Verbal cues, and Handouts Education comprehension: verbalized understanding, returned demonstration, and needs further education   GOALS: Goals reviewed with patient? Yes  SHORT TERM GOALS: Target date: 03/30/2022  Pt will use external aids to recall appointments with occasional min A from spouse over 1 week Baseline: 03/28/22 Goal status: MET  2.  Pt will use to do list or other memory aid to complete 2 household chores 5/7 days a week with occasional min A from spouse Baseline:  Goal status: NOT MET  3.  Pt will verbalize 3 safety concerns re: mobility and cognition and generate appropriate modification using trained meta-cognitive strategy with rare min A Baseline:  Goal status: NOT MET  4.  Pt will implement external aid to facilitate HEP completion per therapist recommendations over 1 week period with occasional min-A from spouse Baseline:  Goal status: NOT MET  5.  Pt will generate external aids (e.g. medication chart) to facilitate increased independence in medication management with usual mod-A  Baseline:  Goal status: NOT MET   LONG TERM GOALS: Target date: 05/25/2022  Pt will accurately sort medications into pill box with mod-I  Baseline:  Goal status: NOT MET, d/t pt/spouse preference  2.  Pt will implement AM medication management routine resulting in adherence to medication administration over 1 week period with rare min-A from spouse Baseline:  Goal status: PARTIALLY MET, pt taking BP meds in kitchen with rare cues from spouse  3.  Pt will complete 3 house hold chores 5/7 days with to do list or other external aid Baseline:  Goal status:  NOT MET  4.  Pt and spouse will report improved participation in household responsibilities using self-anchored rating scale Baseline:  Goal status: NOT MET  ASSESSMENT:  CLINICAL IMPRESSION: Patient and spouse endorse mild improvement in cognitive skills. Pt continues with decreased awareness and household participation. Have implemented strategies to aid in safety and care of patient. Pt and spouse agreeable to d/c this date, verbalize appreciation for therapeutic interventions.   OBJECTIVE IMPAIRMENTS include attention, memory, awareness, and executive functioning. These impairments are limiting patient from managing medications, managing appointments, managing finances, household responsibilities, and ADLs/IADLs. Factors affecting potential to achieve goals and functional outcome are ability to learn/carryover information, cooperation/participation level, and previous level of function.. Patient will benefit from skilled SLP services to address above impairments and improve overall function.  REHAB POTENTIAL: Good  PLAN: SLP FREQUENCY: 2x/week  SLP DURATION: 12 weeks  PLANNED INTERVENTIONS: Cueing hierachy, Cognitive reorganization, Internal/external aids, Functional tasks, SLP instruction and feedback, Compensatory strategies, and Patient/family education   Su Monks, CCC-SLP 05/07/2022, 9:29 AM

## 2022-05-07 ENCOUNTER — Encounter: Payer: Self-pay | Admitting: Occupational Therapy

## 2022-05-07 ENCOUNTER — Ambulatory Visit: Payer: Medicare PPO | Admitting: Occupational Therapy

## 2022-05-07 ENCOUNTER — Ambulatory Visit: Payer: Medicare PPO | Admitting: Speech Pathology

## 2022-05-07 DIAGNOSIS — M6281 Muscle weakness (generalized): Secondary | ICD-10-CM | POA: Diagnosis not present

## 2022-05-07 DIAGNOSIS — R41840 Attention and concentration deficit: Secondary | ICD-10-CM

## 2022-05-07 DIAGNOSIS — R2681 Unsteadiness on feet: Secondary | ICD-10-CM | POA: Diagnosis not present

## 2022-05-07 DIAGNOSIS — R41842 Visuospatial deficit: Secondary | ICD-10-CM | POA: Diagnosis not present

## 2022-05-07 DIAGNOSIS — R278 Other lack of coordination: Secondary | ICD-10-CM

## 2022-05-07 DIAGNOSIS — R41844 Frontal lobe and executive function deficit: Secondary | ICD-10-CM | POA: Diagnosis not present

## 2022-05-07 DIAGNOSIS — R2689 Other abnormalities of gait and mobility: Secondary | ICD-10-CM | POA: Diagnosis not present

## 2022-05-07 DIAGNOSIS — R41841 Cognitive communication deficit: Secondary | ICD-10-CM | POA: Diagnosis not present
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
## 2022-05-09 ENCOUNTER — Ambulatory Visit: Payer: Medicare PPO | Admitting: Occupational Therapy

## 2022-05-15 DIAGNOSIS — I10 Essential (primary) hypertension: Secondary | ICD-10-CM | POA: Diagnosis not present

## 2022-05-15 DIAGNOSIS — G4733 Obstructive sleep apnea (adult) (pediatric): Secondary | ICD-10-CM | POA: Diagnosis not present

## 2022-05-28 DIAGNOSIS — N35912 Unspecified bulbous urethral stricture, male: Secondary | ICD-10-CM | POA: Diagnosis not present

## 2022-05-28 DIAGNOSIS — N35919 Unspecified urethral stricture, male, unspecified site: Secondary | ICD-10-CM | POA: Diagnosis not present

## 2022-05-31 DIAGNOSIS — C61 Malignant neoplasm of prostate: Secondary | ICD-10-CM | POA: Diagnosis not present

## 2022-05-31 DIAGNOSIS — N210 Calculus in bladder: Secondary | ICD-10-CM | POA: Diagnosis not present

## 2022-05-31 DIAGNOSIS — N3945 Continuous leakage: Secondary | ICD-10-CM | POA: Diagnosis not present

## 2022-05-31 DIAGNOSIS — N1831 Chronic kidney disease, stage 3a: Secondary | ICD-10-CM | POA: Diagnosis not present

## 2022-05-31 DIAGNOSIS — N390 Urinary tract infection, site not specified: Secondary | ICD-10-CM | POA: Diagnosis not present

## 2022-05-31 DIAGNOSIS — E669 Obesity, unspecified: Secondary | ICD-10-CM | POA: Diagnosis not present

## 2022-05-31 DIAGNOSIS — R82998 Other abnormal findings in urine: Secondary | ICD-10-CM | POA: Diagnosis not present

## 2022-05-31 DIAGNOSIS — N99111 Postprocedural bulbous urethral stricture: Secondary | ICD-10-CM | POA: Diagnosis not present

## 2022-05-31 DIAGNOSIS — R800 Isolated proteinuria: Secondary | ICD-10-CM | POA: Diagnosis not present

## 2022-05-31 DIAGNOSIS — Z905 Acquired absence of kidney: Secondary | ICD-10-CM | POA: Diagnosis not present

## 2022-07-04 DIAGNOSIS — G4733 Obstructive sleep apnea (adult) (pediatric): Secondary | ICD-10-CM | POA: Diagnosis not present

## 2022-07-17 DIAGNOSIS — E669 Obesity, unspecified: Secondary | ICD-10-CM | POA: Diagnosis not present

## 2022-07-17 DIAGNOSIS — F324 Major depressive disorder, single episode, in partial remission: Secondary | ICD-10-CM | POA: Diagnosis not present

## 2022-07-17 DIAGNOSIS — I10 Essential (primary) hypertension: Secondary | ICD-10-CM | POA: Diagnosis not present

## 2022-07-17 DIAGNOSIS — N1831 Chronic kidney disease, stage 3a: Secondary | ICD-10-CM | POA: Diagnosis not present

## 2022-07-17 DIAGNOSIS — N261 Atrophy of kidney (terminal): Secondary | ICD-10-CM | POA: Diagnosis not present

## 2022-07-17 DIAGNOSIS — R7303 Prediabetes: Secondary | ICD-10-CM | POA: Diagnosis not present

## 2022-07-17 DIAGNOSIS — E876 Hypokalemia: Secondary | ICD-10-CM | POA: Diagnosis not present

## 2022-07-17 DIAGNOSIS — Z0000 Encounter for general adult medical examination without abnormal findings: Secondary | ICD-10-CM | POA: Diagnosis not present

## 2022-07-17 DIAGNOSIS — N35812 Other urethral bulbous stricture, male: Secondary | ICD-10-CM | POA: Diagnosis not present

## 2022-07-18 DIAGNOSIS — N1831 Chronic kidney disease, stage 3a: Secondary | ICD-10-CM | POA: Diagnosis not present

## 2022-07-18 DIAGNOSIS — R7303 Prediabetes: Secondary | ICD-10-CM | POA: Diagnosis not present

## 2022-07-18 DIAGNOSIS — I10 Essential (primary) hypertension: Secondary | ICD-10-CM | POA: Diagnosis not present

## 2022-08-04 DIAGNOSIS — G4733 Obstructive sleep apnea (adult) (pediatric): Secondary | ICD-10-CM | POA: Diagnosis not present

## 2022-08-07 DIAGNOSIS — N390 Urinary tract infection, site not specified: Secondary | ICD-10-CM | POA: Diagnosis not present

## 2022-08-14 ENCOUNTER — Ambulatory Visit: Payer: Medicare PPO | Admitting: Neurology

## 2022-08-14 DIAGNOSIS — N200 Calculus of kidney: Secondary | ICD-10-CM | POA: Diagnosis not present

## 2022-09-02 DIAGNOSIS — G4733 Obstructive sleep apnea (adult) (pediatric): Secondary | ICD-10-CM | POA: Diagnosis not present

## 2022-09-03 DIAGNOSIS — N35912 Unspecified bulbous urethral stricture, male: Secondary | ICD-10-CM | POA: Diagnosis not present

## 2022-09-03 DIAGNOSIS — N200 Calculus of kidney: Secondary | ICD-10-CM | POA: Diagnosis not present

## 2022-09-03 DIAGNOSIS — N2889 Other specified disorders of kidney and ureter: Secondary | ICD-10-CM | POA: Diagnosis not present

## 2022-09-03 DIAGNOSIS — Z531 Procedure and treatment not carried out because of patient's decision for reasons of belief and group pressure: Secondary | ICD-10-CM

## 2022-09-03 DIAGNOSIS — Z789 Other specified health status: Secondary | ICD-10-CM | POA: Diagnosis not present

## 2022-10-03 DIAGNOSIS — G4733 Obstructive sleep apnea (adult) (pediatric): Secondary | ICD-10-CM | POA: Diagnosis not present

## 2022-11-02 DIAGNOSIS — G4733 Obstructive sleep apnea (adult) (pediatric): Secondary | ICD-10-CM | POA: Diagnosis not present

## 2022-11-12 ENCOUNTER — Encounter: Payer: Self-pay | Admitting: Internal Medicine

## 2022-12-24 DIAGNOSIS — Z936 Other artificial openings of urinary tract status: Secondary | ICD-10-CM | POA: Diagnosis not present

## 2022-12-24 DIAGNOSIS — N35912 Unspecified bulbous urethral stricture, male: Secondary | ICD-10-CM | POA: Diagnosis not present

## 2022-12-24 DIAGNOSIS — Z87440 Personal history of urinary (tract) infections: Secondary | ICD-10-CM | POA: Diagnosis not present

## 2022-12-24 DIAGNOSIS — N135 Crossing vessel and stricture of ureter without hydronephrosis: Secondary | ICD-10-CM | POA: Diagnosis not present

## 2022-12-24 DIAGNOSIS — N393 Stress incontinence (female) (male): Secondary | ICD-10-CM | POA: Diagnosis not present

## 2023-01-07 NOTE — Progress Notes (Signed)
 New Patient Note: Chronic Kidney Disease  REFERRING PROVIDER:   Vernon Velna Ranks, MD PRIMARY CARE PROVIDER:  Vernon Velna Ranks, MD   Chief Complaint    Chief Complaint  Patient presents with  . New Patient    History of Present Illness   Nicholas Mckenzie is a 77 y.o. male w/ PMH of CVA, HTN, nonfunctioning right kidney, nephrolithiasis, CKD stage 3 bladder stones, stricture of bulbous urethra s/p dilation and hx of nephroureterectomy on R 11/2021, recurrent UTIs who is seen for evaluation of CKD. Previously evaluated by nephrology in [place] in 03/2022.    Duration of CKD: At least 2 years  Kidney Function: Baseline Cr 1.5 since 2022.  Proteinuria/albuminuria: uP/C 268 mg/g     Kidney anatomy:  CT A/P from OSH 07/2022 Kidneys/ureters:  Right: Status post interval right nephrectomy and partial right ureterectomy. Within the right nephrectomy bed, there is a 2.5 x 4.6 x 4.3 cm sterility indeterminate fluid collection. A similar collection noted on prior ultrasound 04/03/2022. The distal right ureteral stump is suspected extending into the right lower pelvis where it is slightly dilated proximally on series 2 image 120 measuring 12 mm in diameter by 2.3 cm in length. More inferiorly within the ureteral segment appears to be a ureteral stone measuring 2.6 mm series 2 image 129.  HTN: Blood pressure uncontrolled.  BP Readings from Last 5 Encounters:  01/08/23 (!) 157/89  12/24/22 (!) 134/96  09/03/22 (!) 174/93   Meds: amlodipine  10 mg, not taking hydralazine   - reports trailing ACEi/ARB in past but having issues with mood while taking it  - Wife reports that previously pt was taking 16 different medication but then since stroke, he stopped many of them because of side effects.  - Denied lightheadedness, dizziness, or palpitations   No hx of DM: 01/2022 A1C 5.7   Urologic Hx:  Has had hx of prostate cancer s/p radiation therapy.  Hx of bulbous urethral strict   Recurrent UTIs  History of kidney stones: remnant of right ureteral stone   Family history of CKD, ESRD, kidney transplant: none  Problem List    Patient Active Problem List  Diagnosis  . Kidney stone  . Renal mass  . Advance directive on file  . Refusal of blood transfusions as patient is Jehovah's Witness  . Prostate cancer (CMS/HHS-HCC)  . Bulbous urethral stricture    Allergies    Allergies  Allergen Reactions  . Albumin Human Other (See Comments)    Refuses all blood products as one of Je[employer]'s Witnesses  . Statins-Hmg-Coa Reductase Inhibitors Diarrhea, Other (See Comments) and Nausea And Vomiting    Reports statins cause weakness     Medications   Current Outpatient Medications  Medication Sig Dispense Refill  . amLODIPine  (NORVASC ) 10 MG tablet Take 10 mg by mouth once daily    . cholecalciferol, vitamin D3, (VITAMIN D3 ORAL) Take by mouth With Vitamin K, 10,000 IU liquid    . CRANBERRY ORAL Take by mouth 25,000 mg    . potassium/magnesium (MAGNESIUM-POTASSIUM ORAL) Take by mouth Bromelain    . UNABLE TO FIND Med Name: Zyflamend ( herbal pain relief )    . UNABLE TO FIND Med Name: [employer] administrator Emu Oil )    . UNABLE TO FIND Med Name: Black Garlic    . UNABLE TO FIND Med Name: Chanca Piedra capsules    . UNABLE TO FIND Med Name: D-Mannose 1000 mg    . UNABLE TO  FIND Med Name: Prostate Complete    . UNABLE TO FIND Med Name: B Total    . UNABLE TO FIND Med Name: Gold Joint Mobility    . UNABLE TO FIND Med Name: [place]onna homeopathics for inflammation    . UNABLE TO FIND Med Name: Cardio Shield    . UNABLE TO FIND Med Name: Kidney Complete    . UNABLE TO FIND Med Name: Neumi    . UNABLE TO FIND Med Name: Urinary Tract Complete    . UNABLE TO FIND Med Name: Organic Vitamin C drops    . UNABLE TO FIND Med Name: 120 Life    . telmisartan (MICARDIS) 20 MG tablet Take 1 tablet (20 mg total) by mouth once daily 90 tablet 3   No current facility-administered  medications for this visit.    Medical History   PAST MEDICAL HISTORY:  Past Medical History:  Diagnosis Date  . Cancer (CMS/HHS-HCC)   . H/O prostate cancer   . History of stroke   . Hypertension   . Sleep apnea      PAST SURGICAL HISTORY:  Past Surgical History:  Procedure Laterality Date  . OTHER SURGERY Right 03/2022   part of kidney removed  . OTHER SURGERY     laser kidney stone  . OTHER SURGERY Bilateral    eyes for cataracts  . OTHER SURGERY     removal of tonsils     SOCIAL HISTORY:   reports that he has never smoked. He has never used smokeless tobacco.   FAMILY HISTORY family history is not on file.  Review of Systems    I performed a complete review of systems on the patient. Pertinent problems are listed in the HPI. All other systems are reviewed and are negative.   Physical Exam:    Vitals:   01/08/23 1255  BP: (!) 157/89  Pulse: 77  Temp: 36.6 C (97.9 F)   @TMAX (24)@ Ht Readings from Last 1 Encounters:  12/24/22 172.7 cm (5' 8)   Wt Readings from Last 1 Encounters:  01/08/23 98.9 kg (218 lb 0.6 oz)    Physical Exam General appearance: Calm and cooperative. In no acute distress. Sitting comfortably in wheelchair HEENT: Normocephalic. Atraumatic. Non icteric sclera. Moist mucous membranes.  Heart: Regular rate and rhythm. Normal s1,s2. No murmurs noted  Lungs: On room air. Non labored respirations. Clear to auscultation bilaterally  Extremities: No lower extremity edema   Data   Laboratory Data:  Recent labs notable for the following:  Recent Labs    01/08/23 1414  NA 141  K 3.4*  CL 105  CO2 23  BUN 20  CREATININE 1.5*  GFR 48  GLUCOSE 125  MG 2.4    Anemia:  Recent Labs    01/08/23 1414  HGB 15.3  HCT 47.9   Recent Labs    01/08/23 1414  WBC 10.3*  PLT 233    Bone Metabolism:  Recent Labs    01/08/23 1414  CALCIUM  9.6  ALB 4.1  PHOS 3.0  2.9  PTH 85*    Urine Studies:  Recent Labs     01/08/23 1437  MICROCREAT 214.4*    Recent Labs    01/08/23 1437  TPCRERATIO 521*    Recent Labs    01/08/23 1437  SPECGRAV 1.014  LABPH 6.0  PROTEINUA 1+*  GLUCOSEU Negative  KETONESU Negative  BLOODU Negative  NITRITE Positive*  LEUKOCYTESUR 3+*  BILIRUBINUR Negative  UROBILINOGEN 0.2  RBCUA 6*  WBCUA 18*  SQUAMEPI 0  HYALINE 0    Lipids:  No results for input(s): CHOLTOTAL, TRIG, HDL, LDLCALC in the last 73719 hours.  The ASCVD Risk score (Arnett DK, et al., 2019) failed to calculate for the following reasons:   Cannot find a previous HDL lab   Cannot find a previous total cholesterol lab  Diabetes: No results for input(s): HBA1C, HGBA1C, AVGBLDGLUC in the last 73719 hours.  CKD Workup: Additional Labs: No results for input(s): ANASCREEN, DSDNA, PLA2RIMMUNOF, [id number], THSD7AAB, C3, C4, ANA1, ANAPATRN1, ANA2, ANAPATRN2, SMAB, RNPAB, ANTIRO, ROAB, ANTILA, LAAB, ANCA, ANCAPAT, MPO, ANTIPRO3 in the last 73719 hours.  Recent Labs    01/08/23 1414  HEPBSAB NonReactive  HBSABINT <3  HBCIGM NonReactive  HBSAG NonReactive  HCVAB NonReactive    No results for input(s): IFESERUM, MSPIKE1, MSPIKE2, MSPIKE3, KAPLAM, IGKAPPA, IGLAMBDA in the last 8760 hours.     Kidney Biopsy  none  Genetics Performed  none  Urine Microscopy  none     Assessment and Plan    Nicholas Mckenzie is a 77 y.o. 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   Chronic Kidney Disease:  Etiology of kidney disease is likely 2/2 solitary kidney and recurrent UTIs from continue nephrolithiasis. He has nonfunctioning right kidney 2/2 nephrolithiasis, bladder stones, stricture of bulbous urethra s/p dilation and hx of nephroureterectomy on R 11/2021. He has been evaluated by urology and has a retrograde urethrogram scheduled. Will get renal ultrasound as well  Will check Renal function panel , Urinalysis , Urine  albumin/creatinine , Urine protein/creatinine , Hepatitis B Surface antigen, Hepatitis B Surface antibody , Hepatitis B Total core antibody , Hepatitis C, HIV , SPEP, Serum IFE , and Free light chain ratio  RAS Blockade: Will Start ACEi/ARB therapy: telmisartan 20 mg. SGLT2i: SGLT2i not indicated. GLP1 receptor agonist GLP1 receptor agonist is not indicated MRA (mineralocorticoid receptor antagonists): MRA therapy not indicated. Reviewed the importance of blood pressure control in delaying the progression of CKD. Reviewed the importance of diabetes control in delaying the progression of CKD. Advised to avoid NSAIDs.  Blood Pressure/Volume Control:  Blood pressure goal: 157/89. Blood pressure is above goal. Current medications include: amlodipine  10 mg. We will start telmisartan 20 mg daily.  Electrolytes:  Potassium is within goal. Potassium restriction not needed.  Acid Base Status: Bicarbonate historically at goal of >22. No supplementation needed.  Anemia: At goal historically.  Bone Mineral Metabolism: Calcium  and phosphate within goal. Will check Calcium , Phosphate, and iPTH Phosphate binder not indicated  Hyperlipidemia/ Atherosclerotic Cardiovascular Disease Prevention: Discussed that the leading causes of morbidity and death in patients with kidney disease is heart attack and stroke. I reinforced the importance of controlling cholesterol and blood pressure.   CKD Planning:  2-year ESRD risk  Current as of about an hour ago    *  0.7% 0 to 1.9%: Low Risk  2 to 4.9%: Intermediate Risk (consider CKD education)  5 to 19.9%: High Risk (consider Nephrology referral)  >= 20%: Very High Risk (consider planning for future ESRD)    No Change       This score predicts the 2-year probability of requiring kidney failure treatment (dialysis or transplantation) for a patient with CKD stages 3 to 5. Risk thresholds used in health systems for intervention are shown above.  Citation:  JAMA. 2016;315(2):164-174. doi:10.1001/jama.7984.81797.               Discussed goals of CKD care and long-term  outcomes. No acute indication for kidney replacement therapy currently.  Follow up:  Return in about 6 months (around 07/11/2023).  PALAVI PAVAN VAIDYA, MD Nephrology Fellow   This patient is followed in the [facility]. The fellow assigned to this patient is Dr. Palavi Vaidya. Nicholas FELIX Auston, MD is the Clinic Director. If you have issues related to kidney care, cc Dr Mckenzie on all correspondences as well as the fellow. Reach out to Dr. Auston if questions/concerns arise or you have difficulty contacting the fellow.    Attestation Statement:   I personally saw and evaluated the patient, and participated in the management and treatment plan as documented in the resident/fellow note.  Nicholas Mckenzie is a 77 year old with CVA, HTN, nonfunctioning right kidney, nephrolithiasis, CKD stage 3 bladder stones, stricture of bulbous urethra s/p dilation and hx of nephroureterectomy on R 11/2021, recurrent UTIs who is seen for evaluation of CKD. His eGFR is 48 and UACR is 215 mg/g. He is 2 year risk of kidney failure is 0.7%. We will start on ARB today and cannot do SGLT2i because of recurrent UTIs. Plan per Dr Vaidya.     Nicholas FELIX Auston, MD Nephrology Attending Fellows Clinic Director [phone] *Some images could not be shown.

## 2023-01-08 DIAGNOSIS — Z87440 Personal history of urinary (tract) infections: Secondary | ICD-10-CM | POA: Diagnosis not present

## 2023-01-08 DIAGNOSIS — Z905 Acquired absence of kidney: Secondary | ICD-10-CM | POA: Diagnosis not present

## 2023-01-08 DIAGNOSIS — I129 Hypertensive chronic kidney disease with stage 1 through stage 4 chronic kidney disease, or unspecified chronic kidney disease: Secondary | ICD-10-CM | POA: Diagnosis not present

## 2023-01-08 DIAGNOSIS — Z1159 Encounter for screening for other viral diseases: Secondary | ICD-10-CM | POA: Diagnosis not present

## 2023-01-08 DIAGNOSIS — N200 Calculus of kidney: Secondary | ICD-10-CM | POA: Diagnosis not present

## 2023-01-08 DIAGNOSIS — E785 Hyperlipidemia, unspecified: Secondary | ICD-10-CM | POA: Diagnosis not present

## 2023-01-08 DIAGNOSIS — N1832 Chronic kidney disease, stage 3b: Secondary | ICD-10-CM | POA: Diagnosis not present

## 2023-01-08 DIAGNOSIS — D649 Anemia, unspecified: Secondary | ICD-10-CM | POA: Diagnosis not present

## 2023-01-08 DIAGNOSIS — Z87442 Personal history of urinary calculi: Secondary | ICD-10-CM | POA: Diagnosis not present

## 2023-01-08 DIAGNOSIS — E1122 Type 2 diabetes mellitus with diabetic chronic kidney disease: Secondary | ICD-10-CM | POA: Diagnosis not present

## 2023-01-16 DIAGNOSIS — R600 Localized edema: Secondary | ICD-10-CM | POA: Diagnosis not present

## 2023-01-16 DIAGNOSIS — G4733 Obstructive sleep apnea (adult) (pediatric): Secondary | ICD-10-CM | POA: Diagnosis not present

## 2023-01-16 DIAGNOSIS — Z905 Acquired absence of kidney: Secondary | ICD-10-CM | POA: Diagnosis not present

## 2023-01-16 DIAGNOSIS — Z87448 Personal history of other diseases of urinary system: Secondary | ICD-10-CM | POA: Diagnosis not present

## 2023-01-16 DIAGNOSIS — I77810 Thoracic aortic ectasia: Secondary | ICD-10-CM | POA: Diagnosis not present

## 2023-01-16 DIAGNOSIS — N35812 Other urethral bulbous stricture, male: Secondary | ICD-10-CM | POA: Diagnosis not present

## 2023-01-16 DIAGNOSIS — N1831 Chronic kidney disease, stage 3a: Secondary | ICD-10-CM | POA: Diagnosis not present

## 2023-01-16 DIAGNOSIS — I10 Essential (primary) hypertension: Secondary | ICD-10-CM | POA: Diagnosis not present

## 2023-01-16 DIAGNOSIS — I69354 Hemiplegia and hemiparesis following cerebral infarction affecting left non-dominant side: Secondary | ICD-10-CM | POA: Diagnosis not present

## 2023-01-24 DIAGNOSIS — N1832 Chronic kidney disease, stage 3b: Secondary | ICD-10-CM | POA: Diagnosis not present

## 2023-03-01 DIAGNOSIS — N35919 Unspecified urethral stricture, male, unspecified site: Secondary | ICD-10-CM | POA: Diagnosis not present

## 2023-03-01 DIAGNOSIS — N35912 Unspecified bulbous urethral stricture, male: Secondary | ICD-10-CM | POA: Diagnosis not present

## 2023-03-05 DIAGNOSIS — N35919 Unspecified urethral stricture, male, unspecified site: Secondary | ICD-10-CM | POA: Diagnosis not present

## 2023-03-05 DIAGNOSIS — Z87440 Personal history of urinary (tract) infections: Secondary | ICD-10-CM | POA: Diagnosis not present

## 2023-03-20 DIAGNOSIS — N3289 Other specified disorders of bladder: Secondary | ICD-10-CM | POA: Diagnosis not present

## 2023-03-20 DIAGNOSIS — N1832 Chronic kidney disease, stage 3b: Secondary | ICD-10-CM | POA: Diagnosis not present

## 2023-03-20 DIAGNOSIS — N200 Calculus of kidney: Secondary | ICD-10-CM | POA: Diagnosis not present

## 2023-03-27 DIAGNOSIS — N200 Calculus of kidney: Secondary | ICD-10-CM | POA: Diagnosis not present

## 2023-03-27 DIAGNOSIS — R109 Unspecified abdominal pain: Secondary | ICD-10-CM | POA: Diagnosis not present

## 2023-03-27 DIAGNOSIS — C61 Malignant neoplasm of prostate: Secondary | ICD-10-CM | POA: Diagnosis not present

## 2023-03-27 DIAGNOSIS — I10 Essential (primary) hypertension: Secondary | ICD-10-CM | POA: Diagnosis not present

## 2023-03-27 DIAGNOSIS — N35912 Unspecified bulbous urethral stricture, male: Secondary | ICD-10-CM | POA: Diagnosis not present

## 2023-04-04 DIAGNOSIS — N201 Calculus of ureter: Secondary | ICD-10-CM | POA: Diagnosis not present

## 2023-04-04 DIAGNOSIS — N420 Calculus of prostate: Secondary | ICD-10-CM | POA: Diagnosis not present

## 2023-04-04 DIAGNOSIS — N2882 Megaloureter: Secondary | ICD-10-CM | POA: Diagnosis not present

## 2023-04-04 DIAGNOSIS — Z905 Acquired absence of kidney: Secondary | ICD-10-CM | POA: Diagnosis not present

## 2023-04-04 DIAGNOSIS — R109 Unspecified abdominal pain: Secondary | ICD-10-CM | POA: Diagnosis not present

## 2023-04-04 DIAGNOSIS — C61 Malignant neoplasm of prostate: Secondary | ICD-10-CM | POA: Diagnosis not present

## 2023-04-04 DIAGNOSIS — N200 Calculus of kidney: Secondary | ICD-10-CM | POA: Diagnosis not present

## 2023-05-13 DIAGNOSIS — N1832 Chronic kidney disease, stage 3b: Secondary | ICD-10-CM | POA: Diagnosis not present

## 2023-05-13 DIAGNOSIS — N390 Urinary tract infection, site not specified: Secondary | ICD-10-CM | POA: Diagnosis not present

## 2023-05-13 DIAGNOSIS — N200 Calculus of kidney: Secondary | ICD-10-CM | POA: Diagnosis not present

## 2023-05-15 DIAGNOSIS — R399 Unspecified symptoms and signs involving the genitourinary system: Secondary | ICD-10-CM | POA: Diagnosis not present

## 2023-05-21 DIAGNOSIS — Z87440 Personal history of urinary (tract) infections: Secondary | ICD-10-CM | POA: Diagnosis not present

## 2023-05-21 DIAGNOSIS — N35912 Unspecified bulbous urethral stricture, male: Secondary | ICD-10-CM | POA: Diagnosis not present

## 2023-05-21 DIAGNOSIS — R32 Unspecified urinary incontinence: Secondary | ICD-10-CM | POA: Diagnosis not present

## 2023-05-21 DIAGNOSIS — N35919 Unspecified urethral stricture, male, unspecified site: Secondary | ICD-10-CM | POA: Diagnosis not present

## 2023-06-06 DIAGNOSIS — I10 Essential (primary) hypertension: Secondary | ICD-10-CM | POA: Diagnosis not present

## 2023-06-06 DIAGNOSIS — R32 Unspecified urinary incontinence: Secondary | ICD-10-CM | POA: Diagnosis not present

## 2023-06-06 DIAGNOSIS — Z923 Personal history of irradiation: Secondary | ICD-10-CM | POA: Diagnosis not present

## 2023-06-06 DIAGNOSIS — N35819 Other urethral stricture, male, unspecified site: Secondary | ICD-10-CM | POA: Diagnosis not present

## 2023-06-06 DIAGNOSIS — N35916 Unspecified urethral stricture, male, overlapping sites: Secondary | ICD-10-CM | POA: Diagnosis not present

## 2023-06-06 DIAGNOSIS — Z8546 Personal history of malignant neoplasm of prostate: Secondary | ICD-10-CM | POA: Diagnosis not present

## 2023-06-11 DIAGNOSIS — Z466 Encounter for fitting and adjustment of urinary device: Secondary | ICD-10-CM | POA: Diagnosis not present

## 2023-06-18 DIAGNOSIS — R339 Retention of urine, unspecified: Secondary | ICD-10-CM | POA: Diagnosis not present

## 2023-07-08 DIAGNOSIS — N1831 Chronic kidney disease, stage 3a: Secondary | ICD-10-CM | POA: Diagnosis not present

## 2023-07-08 DIAGNOSIS — I69354 Hemiplegia and hemiparesis following cerebral infarction affecting left non-dominant side: Secondary | ICD-10-CM | POA: Diagnosis not present

## 2023-07-08 DIAGNOSIS — I10 Essential (primary) hypertension: Secondary | ICD-10-CM | POA: Diagnosis not present

## 2023-07-12 DIAGNOSIS — N1831 Chronic kidney disease, stage 3a: Secondary | ICD-10-CM | POA: Diagnosis not present

## 2023-07-12 DIAGNOSIS — I129 Hypertensive chronic kidney disease with stage 1 through stage 4 chronic kidney disease, or unspecified chronic kidney disease: Secondary | ICD-10-CM | POA: Diagnosis not present

## 2023-07-12 DIAGNOSIS — H918X2 Other specified hearing loss, left ear: Secondary | ICD-10-CM | POA: Diagnosis not present

## 2023-07-12 DIAGNOSIS — I69254 Hemiplegia and hemiparesis following other nontraumatic intracranial hemorrhage affecting left non-dominant side: Secondary | ICD-10-CM | POA: Diagnosis not present

## 2023-07-12 DIAGNOSIS — D631 Anemia in chronic kidney disease: Secondary | ICD-10-CM | POA: Diagnosis not present

## 2023-07-12 DIAGNOSIS — F324 Major depressive disorder, single episode, in partial remission: Secondary | ICD-10-CM | POA: Diagnosis not present

## 2023-07-12 DIAGNOSIS — I7121 Aneurysm of the ascending aorta, without rupture: Secondary | ICD-10-CM | POA: Diagnosis not present

## 2023-07-12 DIAGNOSIS — H543 Unqualified visual loss, both eyes: Secondary | ICD-10-CM | POA: Diagnosis not present

## 2023-07-12 DIAGNOSIS — I69298 Other sequelae of other nontraumatic intracranial hemorrhage: Secondary | ICD-10-CM | POA: Diagnosis not present

## 2023-07-16 DIAGNOSIS — I129 Hypertensive chronic kidney disease with stage 1 through stage 4 chronic kidney disease, or unspecified chronic kidney disease: Secondary | ICD-10-CM | POA: Diagnosis not present

## 2023-07-16 DIAGNOSIS — H918X2 Other specified hearing loss, left ear: Secondary | ICD-10-CM | POA: Diagnosis not present

## 2023-07-16 DIAGNOSIS — I69298 Other sequelae of other nontraumatic intracranial hemorrhage: Secondary | ICD-10-CM | POA: Diagnosis not present

## 2023-07-16 DIAGNOSIS — H543 Unqualified visual loss, both eyes: Secondary | ICD-10-CM | POA: Diagnosis not present

## 2023-07-16 DIAGNOSIS — I69254 Hemiplegia and hemiparesis following other nontraumatic intracranial hemorrhage affecting left non-dominant side: Secondary | ICD-10-CM | POA: Diagnosis not present

## 2023-07-16 DIAGNOSIS — D631 Anemia in chronic kidney disease: Secondary | ICD-10-CM | POA: Diagnosis not present

## 2023-07-16 DIAGNOSIS — I7121 Aneurysm of the ascending aorta, without rupture: Secondary | ICD-10-CM | POA: Diagnosis not present

## 2023-07-16 DIAGNOSIS — F324 Major depressive disorder, single episode, in partial remission: Secondary | ICD-10-CM | POA: Diagnosis not present

## 2023-07-16 DIAGNOSIS — N1831 Chronic kidney disease, stage 3a: Secondary | ICD-10-CM | POA: Diagnosis not present

## 2023-07-17 DIAGNOSIS — I7121 Aneurysm of the ascending aorta, without rupture: Secondary | ICD-10-CM | POA: Diagnosis not present

## 2023-07-17 DIAGNOSIS — H918X2 Other specified hearing loss, left ear: Secondary | ICD-10-CM | POA: Diagnosis not present

## 2023-07-17 DIAGNOSIS — F324 Major depressive disorder, single episode, in partial remission: Secondary | ICD-10-CM | POA: Diagnosis not present

## 2023-07-17 DIAGNOSIS — I69254 Hemiplegia and hemiparesis following other nontraumatic intracranial hemorrhage affecting left non-dominant side: Secondary | ICD-10-CM | POA: Diagnosis not present

## 2023-07-17 DIAGNOSIS — D631 Anemia in chronic kidney disease: Secondary | ICD-10-CM | POA: Diagnosis not present

## 2023-07-17 DIAGNOSIS — N1831 Chronic kidney disease, stage 3a: Secondary | ICD-10-CM | POA: Diagnosis not present

## 2023-07-17 DIAGNOSIS — H543 Unqualified visual loss, both eyes: Secondary | ICD-10-CM | POA: Diagnosis not present

## 2023-07-17 DIAGNOSIS — I129 Hypertensive chronic kidney disease with stage 1 through stage 4 chronic kidney disease, or unspecified chronic kidney disease: Secondary | ICD-10-CM | POA: Diagnosis not present

## 2023-07-17 DIAGNOSIS — I69298 Other sequelae of other nontraumatic intracranial hemorrhage: Secondary | ICD-10-CM | POA: Diagnosis not present

## 2023-07-19 DIAGNOSIS — H543 Unqualified visual loss, both eyes: Secondary | ICD-10-CM | POA: Diagnosis not present

## 2023-07-19 DIAGNOSIS — I69298 Other sequelae of other nontraumatic intracranial hemorrhage: Secondary | ICD-10-CM | POA: Diagnosis not present

## 2023-07-19 DIAGNOSIS — H918X2 Other specified hearing loss, left ear: Secondary | ICD-10-CM | POA: Diagnosis not present

## 2023-07-19 DIAGNOSIS — N1831 Chronic kidney disease, stage 3a: Secondary | ICD-10-CM | POA: Diagnosis not present

## 2023-07-19 DIAGNOSIS — D631 Anemia in chronic kidney disease: Secondary | ICD-10-CM | POA: Diagnosis not present

## 2023-07-19 DIAGNOSIS — I69254 Hemiplegia and hemiparesis following other nontraumatic intracranial hemorrhage affecting left non-dominant side: Secondary | ICD-10-CM | POA: Diagnosis not present

## 2023-07-19 DIAGNOSIS — I129 Hypertensive chronic kidney disease with stage 1 through stage 4 chronic kidney disease, or unspecified chronic kidney disease: Secondary | ICD-10-CM | POA: Diagnosis not present

## 2023-07-19 DIAGNOSIS — F324 Major depressive disorder, single episode, in partial remission: Secondary | ICD-10-CM | POA: Diagnosis not present

## 2023-07-19 DIAGNOSIS — I7121 Aneurysm of the ascending aorta, without rupture: Secondary | ICD-10-CM | POA: Diagnosis not present

## 2023-07-22 DIAGNOSIS — I69254 Hemiplegia and hemiparesis following other nontraumatic intracranial hemorrhage affecting left non-dominant side: Secondary | ICD-10-CM | POA: Diagnosis not present

## 2023-07-22 DIAGNOSIS — I129 Hypertensive chronic kidney disease with stage 1 through stage 4 chronic kidney disease, or unspecified chronic kidney disease: Secondary | ICD-10-CM | POA: Diagnosis not present

## 2023-07-22 DIAGNOSIS — N1831 Chronic kidney disease, stage 3a: Secondary | ICD-10-CM | POA: Diagnosis not present

## 2023-07-22 DIAGNOSIS — I7121 Aneurysm of the ascending aorta, without rupture: Secondary | ICD-10-CM | POA: Diagnosis not present

## 2023-07-22 DIAGNOSIS — H918X2 Other specified hearing loss, left ear: Secondary | ICD-10-CM | POA: Diagnosis not present

## 2023-07-22 DIAGNOSIS — F324 Major depressive disorder, single episode, in partial remission: Secondary | ICD-10-CM | POA: Diagnosis not present

## 2023-07-22 DIAGNOSIS — D631 Anemia in chronic kidney disease: Secondary | ICD-10-CM | POA: Diagnosis not present

## 2023-07-22 DIAGNOSIS — I69298 Other sequelae of other nontraumatic intracranial hemorrhage: Secondary | ICD-10-CM | POA: Diagnosis not present

## 2023-07-22 DIAGNOSIS — H543 Unqualified visual loss, both eyes: Secondary | ICD-10-CM | POA: Diagnosis not present

## 2023-07-24 DIAGNOSIS — I69298 Other sequelae of other nontraumatic intracranial hemorrhage: Secondary | ICD-10-CM | POA: Diagnosis not present

## 2023-07-24 DIAGNOSIS — I7121 Aneurysm of the ascending aorta, without rupture: Secondary | ICD-10-CM | POA: Diagnosis not present

## 2023-07-24 DIAGNOSIS — D631 Anemia in chronic kidney disease: Secondary | ICD-10-CM | POA: Diagnosis not present

## 2023-07-24 DIAGNOSIS — I129 Hypertensive chronic kidney disease with stage 1 through stage 4 chronic kidney disease, or unspecified chronic kidney disease: Secondary | ICD-10-CM | POA: Diagnosis not present

## 2023-07-24 DIAGNOSIS — I69254 Hemiplegia and hemiparesis following other nontraumatic intracranial hemorrhage affecting left non-dominant side: Secondary | ICD-10-CM | POA: Diagnosis not present

## 2023-07-24 DIAGNOSIS — H918X2 Other specified hearing loss, left ear: Secondary | ICD-10-CM | POA: Diagnosis not present

## 2023-07-24 DIAGNOSIS — N1831 Chronic kidney disease, stage 3a: Secondary | ICD-10-CM | POA: Diagnosis not present

## 2023-07-24 DIAGNOSIS — F324 Major depressive disorder, single episode, in partial remission: Secondary | ICD-10-CM | POA: Diagnosis not present

## 2023-07-24 DIAGNOSIS — H543 Unqualified visual loss, both eyes: Secondary | ICD-10-CM | POA: Diagnosis not present

## 2023-07-25 DIAGNOSIS — H918X2 Other specified hearing loss, left ear: Secondary | ICD-10-CM | POA: Diagnosis not present

## 2023-07-25 DIAGNOSIS — I69298 Other sequelae of other nontraumatic intracranial hemorrhage: Secondary | ICD-10-CM | POA: Diagnosis not present

## 2023-07-25 DIAGNOSIS — N1831 Chronic kidney disease, stage 3a: Secondary | ICD-10-CM | POA: Diagnosis not present

## 2023-07-25 DIAGNOSIS — H543 Unqualified visual loss, both eyes: Secondary | ICD-10-CM | POA: Diagnosis not present

## 2023-07-25 DIAGNOSIS — I129 Hypertensive chronic kidney disease with stage 1 through stage 4 chronic kidney disease, or unspecified chronic kidney disease: Secondary | ICD-10-CM | POA: Diagnosis not present

## 2023-07-25 DIAGNOSIS — F324 Major depressive disorder, single episode, in partial remission: Secondary | ICD-10-CM | POA: Diagnosis not present

## 2023-07-25 DIAGNOSIS — I7121 Aneurysm of the ascending aorta, without rupture: Secondary | ICD-10-CM | POA: Diagnosis not present

## 2023-07-25 DIAGNOSIS — D631 Anemia in chronic kidney disease: Secondary | ICD-10-CM | POA: Diagnosis not present

## 2023-07-25 DIAGNOSIS — I69254 Hemiplegia and hemiparesis following other nontraumatic intracranial hemorrhage affecting left non-dominant side: Secondary | ICD-10-CM | POA: Diagnosis not present

## 2023-07-29 DIAGNOSIS — I69254 Hemiplegia and hemiparesis following other nontraumatic intracranial hemorrhage affecting left non-dominant side: Secondary | ICD-10-CM | POA: Diagnosis not present

## 2023-07-29 DIAGNOSIS — F324 Major depressive disorder, single episode, in partial remission: Secondary | ICD-10-CM | POA: Diagnosis not present

## 2023-07-29 DIAGNOSIS — H543 Unqualified visual loss, both eyes: Secondary | ICD-10-CM | POA: Diagnosis not present

## 2023-07-29 DIAGNOSIS — D631 Anemia in chronic kidney disease: Secondary | ICD-10-CM | POA: Diagnosis not present

## 2023-07-29 DIAGNOSIS — I7121 Aneurysm of the ascending aorta, without rupture: Secondary | ICD-10-CM | POA: Diagnosis not present

## 2023-07-29 DIAGNOSIS — H918X2 Other specified hearing loss, left ear: Secondary | ICD-10-CM | POA: Diagnosis not present

## 2023-07-29 DIAGNOSIS — N1831 Chronic kidney disease, stage 3a: Secondary | ICD-10-CM | POA: Diagnosis not present

## 2023-07-29 DIAGNOSIS — I129 Hypertensive chronic kidney disease with stage 1 through stage 4 chronic kidney disease, or unspecified chronic kidney disease: Secondary | ICD-10-CM | POA: Diagnosis not present

## 2023-07-29 DIAGNOSIS — I69298 Other sequelae of other nontraumatic intracranial hemorrhage: Secondary | ICD-10-CM | POA: Diagnosis not present

## 2023-07-30 DIAGNOSIS — D631 Anemia in chronic kidney disease: Secondary | ICD-10-CM | POA: Diagnosis not present

## 2023-07-30 DIAGNOSIS — I7121 Aneurysm of the ascending aorta, without rupture: Secondary | ICD-10-CM | POA: Diagnosis not present

## 2023-07-30 DIAGNOSIS — I69298 Other sequelae of other nontraumatic intracranial hemorrhage: Secondary | ICD-10-CM | POA: Diagnosis not present

## 2023-07-30 DIAGNOSIS — H543 Unqualified visual loss, both eyes: Secondary | ICD-10-CM | POA: Diagnosis not present

## 2023-07-30 DIAGNOSIS — I129 Hypertensive chronic kidney disease with stage 1 through stage 4 chronic kidney disease, or unspecified chronic kidney disease: Secondary | ICD-10-CM | POA: Diagnosis not present

## 2023-07-30 DIAGNOSIS — I69254 Hemiplegia and hemiparesis following other nontraumatic intracranial hemorrhage affecting left non-dominant side: Secondary | ICD-10-CM | POA: Diagnosis not present

## 2023-07-30 DIAGNOSIS — F324 Major depressive disorder, single episode, in partial remission: Secondary | ICD-10-CM | POA: Diagnosis not present

## 2023-07-30 DIAGNOSIS — N1831 Chronic kidney disease, stage 3a: Secondary | ICD-10-CM | POA: Diagnosis not present

## 2023-07-30 DIAGNOSIS — H918X2 Other specified hearing loss, left ear: Secondary | ICD-10-CM | POA: Diagnosis not present

## 2023-08-01 DIAGNOSIS — D631 Anemia in chronic kidney disease: Secondary | ICD-10-CM | POA: Diagnosis not present

## 2023-08-01 DIAGNOSIS — H543 Unqualified visual loss, both eyes: Secondary | ICD-10-CM | POA: Diagnosis not present

## 2023-08-01 DIAGNOSIS — N1831 Chronic kidney disease, stage 3a: Secondary | ICD-10-CM | POA: Diagnosis not present

## 2023-08-01 DIAGNOSIS — I69298 Other sequelae of other nontraumatic intracranial hemorrhage: Secondary | ICD-10-CM | POA: Diagnosis not present

## 2023-08-01 DIAGNOSIS — I7121 Aneurysm of the ascending aorta, without rupture: Secondary | ICD-10-CM | POA: Diagnosis not present

## 2023-08-01 DIAGNOSIS — F324 Major depressive disorder, single episode, in partial remission: Secondary | ICD-10-CM | POA: Diagnosis not present

## 2023-08-01 DIAGNOSIS — H918X2 Other specified hearing loss, left ear: Secondary | ICD-10-CM | POA: Diagnosis not present

## 2023-08-01 DIAGNOSIS — I69254 Hemiplegia and hemiparesis following other nontraumatic intracranial hemorrhage affecting left non-dominant side: Secondary | ICD-10-CM | POA: Diagnosis not present

## 2023-08-01 DIAGNOSIS — I129 Hypertensive chronic kidney disease with stage 1 through stage 4 chronic kidney disease, or unspecified chronic kidney disease: Secondary | ICD-10-CM | POA: Diagnosis not present

## 2023-08-06 DIAGNOSIS — R339 Retention of urine, unspecified: Secondary | ICD-10-CM | POA: Diagnosis not present

## 2023-08-07 DIAGNOSIS — I69254 Hemiplegia and hemiparesis following other nontraumatic intracranial hemorrhage affecting left non-dominant side: Secondary | ICD-10-CM | POA: Diagnosis not present

## 2023-08-07 DIAGNOSIS — N1831 Chronic kidney disease, stage 3a: Secondary | ICD-10-CM | POA: Diagnosis not present

## 2023-08-07 DIAGNOSIS — I69298 Other sequelae of other nontraumatic intracranial hemorrhage: Secondary | ICD-10-CM | POA: Diagnosis not present

## 2023-08-07 DIAGNOSIS — H543 Unqualified visual loss, both eyes: Secondary | ICD-10-CM | POA: Diagnosis not present

## 2023-08-07 DIAGNOSIS — F324 Major depressive disorder, single episode, in partial remission: Secondary | ICD-10-CM | POA: Diagnosis not present

## 2023-08-07 DIAGNOSIS — I129 Hypertensive chronic kidney disease with stage 1 through stage 4 chronic kidney disease, or unspecified chronic kidney disease: Secondary | ICD-10-CM | POA: Diagnosis not present

## 2023-08-07 DIAGNOSIS — H918X2 Other specified hearing loss, left ear: Secondary | ICD-10-CM | POA: Diagnosis not present

## 2023-08-07 DIAGNOSIS — D631 Anemia in chronic kidney disease: Secondary | ICD-10-CM | POA: Diagnosis not present

## 2023-08-07 DIAGNOSIS — I7121 Aneurysm of the ascending aorta, without rupture: Secondary | ICD-10-CM | POA: Diagnosis not present

## 2023-08-12 DIAGNOSIS — F324 Major depressive disorder, single episode, in partial remission: Secondary | ICD-10-CM | POA: Diagnosis not present

## 2023-08-12 DIAGNOSIS — D631 Anemia in chronic kidney disease: Secondary | ICD-10-CM | POA: Diagnosis not present

## 2023-08-12 DIAGNOSIS — I69298 Other sequelae of other nontraumatic intracranial hemorrhage: Secondary | ICD-10-CM | POA: Diagnosis not present

## 2023-08-12 DIAGNOSIS — H918X2 Other specified hearing loss, left ear: Secondary | ICD-10-CM | POA: Diagnosis not present

## 2023-08-12 DIAGNOSIS — I7121 Aneurysm of the ascending aorta, without rupture: Secondary | ICD-10-CM | POA: Diagnosis not present

## 2023-08-12 DIAGNOSIS — N1831 Chronic kidney disease, stage 3a: Secondary | ICD-10-CM | POA: Diagnosis not present

## 2023-08-12 DIAGNOSIS — I129 Hypertensive chronic kidney disease with stage 1 through stage 4 chronic kidney disease, or unspecified chronic kidney disease: Secondary | ICD-10-CM | POA: Diagnosis not present

## 2023-08-12 DIAGNOSIS — I69254 Hemiplegia and hemiparesis following other nontraumatic intracranial hemorrhage affecting left non-dominant side: Secondary | ICD-10-CM | POA: Diagnosis not present

## 2023-08-12 DIAGNOSIS — H543 Unqualified visual loss, both eyes: Secondary | ICD-10-CM | POA: Diagnosis not present

## 2023-08-13 DIAGNOSIS — D631 Anemia in chronic kidney disease: Secondary | ICD-10-CM | POA: Diagnosis not present

## 2023-08-13 DIAGNOSIS — H918X2 Other specified hearing loss, left ear: Secondary | ICD-10-CM | POA: Diagnosis not present

## 2023-08-13 DIAGNOSIS — I129 Hypertensive chronic kidney disease with stage 1 through stage 4 chronic kidney disease, or unspecified chronic kidney disease: Secondary | ICD-10-CM | POA: Diagnosis not present

## 2023-08-13 DIAGNOSIS — H543 Unqualified visual loss, both eyes: Secondary | ICD-10-CM | POA: Diagnosis not present

## 2023-08-13 DIAGNOSIS — F324 Major depressive disorder, single episode, in partial remission: Secondary | ICD-10-CM | POA: Diagnosis not present

## 2023-08-13 DIAGNOSIS — N1831 Chronic kidney disease, stage 3a: Secondary | ICD-10-CM | POA: Diagnosis not present

## 2023-08-13 DIAGNOSIS — I7121 Aneurysm of the ascending aorta, without rupture: Secondary | ICD-10-CM | POA: Diagnosis not present

## 2023-08-13 DIAGNOSIS — I69298 Other sequelae of other nontraumatic intracranial hemorrhage: Secondary | ICD-10-CM | POA: Diagnosis not present

## 2023-08-13 DIAGNOSIS — I69254 Hemiplegia and hemiparesis following other nontraumatic intracranial hemorrhage affecting left non-dominant side: Secondary | ICD-10-CM | POA: Diagnosis not present

## 2023-08-21 DIAGNOSIS — I69298 Other sequelae of other nontraumatic intracranial hemorrhage: Secondary | ICD-10-CM | POA: Diagnosis not present

## 2023-08-21 DIAGNOSIS — F324 Major depressive disorder, single episode, in partial remission: Secondary | ICD-10-CM | POA: Diagnosis not present

## 2023-08-21 DIAGNOSIS — I7121 Aneurysm of the ascending aorta, without rupture: Secondary | ICD-10-CM | POA: Diagnosis not present

## 2023-08-21 DIAGNOSIS — N1831 Chronic kidney disease, stage 3a: Secondary | ICD-10-CM | POA: Diagnosis not present

## 2023-08-21 DIAGNOSIS — I69254 Hemiplegia and hemiparesis following other nontraumatic intracranial hemorrhage affecting left non-dominant side: Secondary | ICD-10-CM | POA: Diagnosis not present

## 2023-08-21 DIAGNOSIS — I129 Hypertensive chronic kidney disease with stage 1 through stage 4 chronic kidney disease, or unspecified chronic kidney disease: Secondary | ICD-10-CM | POA: Diagnosis not present

## 2023-08-21 DIAGNOSIS — H918X2 Other specified hearing loss, left ear: Secondary | ICD-10-CM | POA: Diagnosis not present

## 2023-08-21 DIAGNOSIS — D631 Anemia in chronic kidney disease: Secondary | ICD-10-CM | POA: Diagnosis not present

## 2023-08-21 DIAGNOSIS — H543 Unqualified visual loss, both eyes: Secondary | ICD-10-CM | POA: Diagnosis not present

## 2023-09-05 NOTE — Progress Notes (Signed)
 We are not able to discuss the details of this and other physician records via telephone or MyChart messages.  We appreciate your interest in compiling your medical records using source medical charting such as this document. Use of medical terminology and expressions during your visit were used when necessary and generally kept to a minimum for your best understanding of the diagnosis, workup, and next steps.  If you have a question regarding a specific medical term, expression, or phrase that you think was not integrated into the discussion, please write it down so it may be addressed at your next clinic visit.  You may call [phone] to schedule a clinic visit to specifically discuss what is written in this physician note.   Urology Return Visit  Chief Complaint: recurring UTIS, BNC  History of Present Illness: Nicholas Mckenzie is a 78 y.o.  M   S/P dilation of urethral stricture on 06/06/23:  He was started on a CIC regimen   He has been doing well, catheterizing every few days with some resistance using a 14 fr coude catheter, no infections.  Continue florstor.  He had a tough time recovering from surgery and anesthesia cognitively   OPERATIVE FINDINGS:  -very dense and difficult proximal bulbar/membranous urethral stricture dilated -14 fr catheter placed over wire  recurring UTIs, urinary incontinence, solitary kidney, CKD   Prior Intervention:dilation, then dilation with optilume  Prior Radiation: yes   History taken from Dr. June    UROLOGICAL HISTORY  ~2010 - prostate cancer managed with radiation therapy ~2020 - right ureteral stent placed for right ureteral obstruction of unknown case 09/07/2021 - initially evaluated by [facility] for encrusted ureteral stent and indwelling right PCN for non-functional right kidney. PCN likely placed for obstructive pyelonephritis 12/08/2021 - right nephrectomy and partial ureterectomy, removal of ureteral stones, dilation of bulbar  urethral stricture with holium laser urethrotomy, distal curl of stent left in bladder as had formed large bladder stone 12/20/2021 - cystolithalopaxy 02/20/2022 - PSA 0.07 03/02/2022 - urinary retention requiring urethral dilation and placement of catheter 04/03/2022 - passed TOV 05/31/2022 - appointment with Dr. Bettie with RUG demonstrating long proximal urethral stricture. Elected not for formal reconstructive surgery and management with Optilume dilation. 08/08/2022 - Recurrent UTI prompting CT scan which demonstrates stone in the right ureteral remnant 09/03/2022 - IPSS 6, BI 3; IIEF-5 not filled out. Cystoscopy demonstrates a bulbar urethral stricture which could not be traversed with the cystoscope.    GU Issues: Recurring UTIS Severe incontinence  BNC Right nephrectomy and stone in ureteral stump Prostate cancer  Prior AUA Symptom Scores    12/24/2022   10:00 AM  AUA Symptoms Score  Incomplete emptying: Over the past month, how often have you had a sensation of not emptying your bladder completely after you finished urinating? 5  Frequency: Over the past month, how often have you had to urinate again less than 2 hours after you finished urinating? 5  Intermittency: Over the past month, how often have you found that you stopped and started again several times when you urinated? 4  Urgency: Over the past month, how often have you found it difficult to postpone urination? 5  Weak Stream: Over the past month, how often have you had a weak stream? 5  Straining: Over the past month, how often have you had to push or strain to begin urination? 5  Nocturia: Over the past month or so, how many times did you get up to urinate from the  time you went to bed until the time you got up in the morning? 5  Quality of Life Due to Urinary Symptoms: If you were to spend the rest of your life with your urinary condition just the way it is now, how would you feel about that? Terrible  Total AUA 34      Physical Exam: BP (!) 168/102 (BP Location: Right upper arm, Patient Position: Sitting, BP Cuff Size: Adult) Comment: Dr. Cristela advised.  Pulse 57   SpO2 92%  General: alert and oriented x3, no apparent distress, using wheelchair    Assessment:  Nicholas Mckenzie is a 78 y.o.  Assessment & Plan Urinary incontinence Urinary incontinence persists due to scar tissue and catheterization. Current management with an external catheter (condom catheter) helps but has leakage issues. An artificial sphincter was discussed as an option to improve incontinence, but the risk of device erosion into the urethra is significantly higher at 16-20% due to scar tissue and catheterization. Bladder removal surgery is another option but is a major operation with significant risks, especially given previous cognitive reactions to anesthesia. Non-invasive options like penile clamps and different external catheters were also discussed. - Provide information on external continence devices - Discuss the option of penile clamps for incontinence management. - Continue current management with external catheter.  Urinary tract infections (UTIs) UTIs are managed with catheterization and probiotics, reducing infection frequency. The goal is to minimize infection risk while managing incontinence. 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 - Avoid invasive procedures requiring anesthesia if possible. - Continue seeing a brain therapist for cognitive rehabilitation.  Renal function monitoring Renal function was last assessed in July 2024. He prefers renal function tests at the current facility for  trust and continuity of care. - Coordinate renal function testing with the nephrologist at [facility], with an appointment scheduled for January 07, 2024.  RTC 1 year No indication to perform a ureterectomy   The total time for the visit was 35 minutes, of which greater than 50% was spent with the patient in counseling and/or coordination of care.

## 2023-09-09 DIAGNOSIS — N393 Stress incontinence (female) (male): Secondary | ICD-10-CM | POA: Diagnosis not present

## 2023-09-09 DIAGNOSIS — D631 Anemia in chronic kidney disease: Secondary | ICD-10-CM | POA: Diagnosis not present

## 2023-09-09 DIAGNOSIS — H543 Unqualified visual loss, both eyes: Secondary | ICD-10-CM | POA: Diagnosis not present

## 2023-09-09 DIAGNOSIS — H918X2 Other specified hearing loss, left ear: Secondary | ICD-10-CM | POA: Diagnosis not present

## 2023-09-09 DIAGNOSIS — I7121 Aneurysm of the ascending aorta, without rupture: Secondary | ICD-10-CM | POA: Diagnosis not present

## 2023-09-09 DIAGNOSIS — N1831 Chronic kidney disease, stage 3a: Secondary | ICD-10-CM | POA: Diagnosis not present

## 2023-09-09 DIAGNOSIS — I69254 Hemiplegia and hemiparesis following other nontraumatic intracranial hemorrhage affecting left non-dominant side: Secondary | ICD-10-CM | POA: Diagnosis not present

## 2023-09-09 DIAGNOSIS — I69298 Other sequelae of other nontraumatic intracranial hemorrhage: Secondary | ICD-10-CM | POA: Diagnosis not present

## 2023-09-09 DIAGNOSIS — Z87448 Personal history of other diseases of urinary system: Secondary | ICD-10-CM | POA: Diagnosis not present

## 2023-09-09 DIAGNOSIS — F324 Major depressive disorder, single episode, in partial remission: Secondary | ICD-10-CM | POA: Diagnosis not present

## 2023-09-09 DIAGNOSIS — Z87440 Personal history of urinary (tract) infections: Secondary | ICD-10-CM | POA: Diagnosis not present

## 2023-09-09 DIAGNOSIS — I129 Hypertensive chronic kidney disease with stage 1 through stage 4 chronic kidney disease, or unspecified chronic kidney disease: Secondary | ICD-10-CM | POA: Diagnosis not present

## 2023-09-10 DIAGNOSIS — I69298 Other sequelae of other nontraumatic intracranial hemorrhage: Secondary | ICD-10-CM | POA: Diagnosis not present

## 2023-09-10 DIAGNOSIS — R7303 Prediabetes: Secondary | ICD-10-CM | POA: Diagnosis not present

## 2023-09-10 DIAGNOSIS — N1831 Chronic kidney disease, stage 3a: Secondary | ICD-10-CM | POA: Diagnosis not present

## 2023-09-10 DIAGNOSIS — H918X2 Other specified hearing loss, left ear: Secondary | ICD-10-CM | POA: Diagnosis not present

## 2023-09-10 DIAGNOSIS — H543 Unqualified visual loss, both eyes: Secondary | ICD-10-CM | POA: Diagnosis not present

## 2023-09-10 DIAGNOSIS — F324 Major depressive disorder, single episode, in partial remission: Secondary | ICD-10-CM | POA: Diagnosis not present

## 2023-09-10 DIAGNOSIS — I69234 Monoplegia of upper limb following other nontraumatic intracranial hemorrhage affecting left non-dominant side: Secondary | ICD-10-CM | POA: Diagnosis not present

## 2023-09-10 DIAGNOSIS — I7121 Aneurysm of the ascending aorta, without rupture: Secondary | ICD-10-CM | POA: Diagnosis not present

## 2023-09-10 DIAGNOSIS — I129 Hypertensive chronic kidney disease with stage 1 through stage 4 chronic kidney disease, or unspecified chronic kidney disease: Secondary | ICD-10-CM | POA: Diagnosis not present

## 2023-09-11 DIAGNOSIS — D631 Anemia in chronic kidney disease: Secondary | ICD-10-CM | POA: Diagnosis not present

## 2023-09-11 DIAGNOSIS — I129 Hypertensive chronic kidney disease with stage 1 through stage 4 chronic kidney disease, or unspecified chronic kidney disease: Secondary | ICD-10-CM | POA: Diagnosis not present

## 2023-09-11 DIAGNOSIS — H918X2 Other specified hearing loss, left ear: Secondary | ICD-10-CM | POA: Diagnosis not present

## 2023-09-11 DIAGNOSIS — F324 Major depressive disorder, single episode, in partial remission: Secondary | ICD-10-CM | POA: Diagnosis not present

## 2023-09-11 DIAGNOSIS — N1831 Chronic kidney disease, stage 3a: Secondary | ICD-10-CM | POA: Diagnosis not present

## 2023-09-11 DIAGNOSIS — I69254 Hemiplegia and hemiparesis following other nontraumatic intracranial hemorrhage affecting left non-dominant side: Secondary | ICD-10-CM | POA: Diagnosis not present

## 2023-09-11 DIAGNOSIS — I7121 Aneurysm of the ascending aorta, without rupture: Secondary | ICD-10-CM | POA: Diagnosis not present

## 2023-09-11 DIAGNOSIS — I69298 Other sequelae of other nontraumatic intracranial hemorrhage: Secondary | ICD-10-CM | POA: Diagnosis not present

## 2023-09-11 DIAGNOSIS — H543 Unqualified visual loss, both eyes: Secondary | ICD-10-CM | POA: Diagnosis not present

## 2023-09-16 DIAGNOSIS — I129 Hypertensive chronic kidney disease with stage 1 through stage 4 chronic kidney disease, or unspecified chronic kidney disease: Secondary | ICD-10-CM | POA: Diagnosis not present

## 2023-09-16 DIAGNOSIS — I69254 Hemiplegia and hemiparesis following other nontraumatic intracranial hemorrhage affecting left non-dominant side: Secondary | ICD-10-CM | POA: Diagnosis not present

## 2023-09-16 DIAGNOSIS — I7121 Aneurysm of the ascending aorta, without rupture: Secondary | ICD-10-CM | POA: Diagnosis not present

## 2023-09-16 DIAGNOSIS — I69298 Other sequelae of other nontraumatic intracranial hemorrhage: Secondary | ICD-10-CM | POA: Diagnosis not present

## 2023-09-16 DIAGNOSIS — H918X2 Other specified hearing loss, left ear: Secondary | ICD-10-CM | POA: Diagnosis not present

## 2023-09-16 DIAGNOSIS — D631 Anemia in chronic kidney disease: Secondary | ICD-10-CM | POA: Diagnosis not present

## 2023-09-16 DIAGNOSIS — N1831 Chronic kidney disease, stage 3a: Secondary | ICD-10-CM | POA: Diagnosis not present

## 2023-09-16 DIAGNOSIS — F324 Major depressive disorder, single episode, in partial remission: Secondary | ICD-10-CM | POA: Diagnosis not present

## 2023-09-16 DIAGNOSIS — H543 Unqualified visual loss, both eyes: Secondary | ICD-10-CM | POA: Diagnosis not present

## 2023-09-18 DIAGNOSIS — I69298 Other sequelae of other nontraumatic intracranial hemorrhage: Secondary | ICD-10-CM | POA: Diagnosis not present

## 2023-09-18 DIAGNOSIS — I129 Hypertensive chronic kidney disease with stage 1 through stage 4 chronic kidney disease, or unspecified chronic kidney disease: Secondary | ICD-10-CM | POA: Diagnosis not present

## 2023-09-18 DIAGNOSIS — N1831 Chronic kidney disease, stage 3a: Secondary | ICD-10-CM | POA: Diagnosis not present

## 2023-09-18 DIAGNOSIS — F324 Major depressive disorder, single episode, in partial remission: Secondary | ICD-10-CM | POA: Diagnosis not present

## 2023-09-18 DIAGNOSIS — D631 Anemia in chronic kidney disease: Secondary | ICD-10-CM | POA: Diagnosis not present

## 2023-09-18 DIAGNOSIS — I69254 Hemiplegia and hemiparesis following other nontraumatic intracranial hemorrhage affecting left non-dominant side: Secondary | ICD-10-CM | POA: Diagnosis not present

## 2023-09-18 DIAGNOSIS — H918X2 Other specified hearing loss, left ear: Secondary | ICD-10-CM | POA: Diagnosis not present

## 2023-09-18 DIAGNOSIS — H543 Unqualified visual loss, both eyes: Secondary | ICD-10-CM | POA: Diagnosis not present

## 2023-09-18 DIAGNOSIS — I7121 Aneurysm of the ascending aorta, without rupture: Secondary | ICD-10-CM | POA: Diagnosis not present

## 2023-09-20 DIAGNOSIS — I69254 Hemiplegia and hemiparesis following other nontraumatic intracranial hemorrhage affecting left non-dominant side: Secondary | ICD-10-CM | POA: Diagnosis not present

## 2023-09-20 DIAGNOSIS — I129 Hypertensive chronic kidney disease with stage 1 through stage 4 chronic kidney disease, or unspecified chronic kidney disease: Secondary | ICD-10-CM | POA: Diagnosis not present

## 2023-09-20 DIAGNOSIS — I7121 Aneurysm of the ascending aorta, without rupture: Secondary | ICD-10-CM | POA: Diagnosis not present

## 2023-09-20 DIAGNOSIS — D631 Anemia in chronic kidney disease: Secondary | ICD-10-CM | POA: Diagnosis not present

## 2023-09-20 DIAGNOSIS — N1831 Chronic kidney disease, stage 3a: Secondary | ICD-10-CM | POA: Diagnosis not present

## 2023-09-20 DIAGNOSIS — F324 Major depressive disorder, single episode, in partial remission: Secondary | ICD-10-CM | POA: Diagnosis not present

## 2023-09-20 DIAGNOSIS — I69298 Other sequelae of other nontraumatic intracranial hemorrhage: Secondary | ICD-10-CM | POA: Diagnosis not present

## 2023-09-20 DIAGNOSIS — H543 Unqualified visual loss, both eyes: Secondary | ICD-10-CM | POA: Diagnosis not present

## 2023-09-20 DIAGNOSIS — H918X2 Other specified hearing loss, left ear: Secondary | ICD-10-CM | POA: Diagnosis not present

## 2023-09-23 DIAGNOSIS — I129 Hypertensive chronic kidney disease with stage 1 through stage 4 chronic kidney disease, or unspecified chronic kidney disease: Secondary | ICD-10-CM | POA: Diagnosis not present

## 2023-09-23 DIAGNOSIS — I7121 Aneurysm of the ascending aorta, without rupture: Secondary | ICD-10-CM | POA: Diagnosis not present

## 2023-09-23 DIAGNOSIS — N1831 Chronic kidney disease, stage 3a: Secondary | ICD-10-CM | POA: Diagnosis not present

## 2023-09-23 DIAGNOSIS — F324 Major depressive disorder, single episode, in partial remission: Secondary | ICD-10-CM | POA: Diagnosis not present

## 2023-09-23 DIAGNOSIS — H918X2 Other specified hearing loss, left ear: Secondary | ICD-10-CM | POA: Diagnosis not present

## 2023-09-23 DIAGNOSIS — H543 Unqualified visual loss, both eyes: Secondary | ICD-10-CM | POA: Diagnosis not present

## 2023-09-23 DIAGNOSIS — D631 Anemia in chronic kidney disease: Secondary | ICD-10-CM | POA: Diagnosis not present

## 2023-09-23 DIAGNOSIS — I69298 Other sequelae of other nontraumatic intracranial hemorrhage: Secondary | ICD-10-CM | POA: Diagnosis not present

## 2023-09-23 DIAGNOSIS — I69254 Hemiplegia and hemiparesis following other nontraumatic intracranial hemorrhage affecting left non-dominant side: Secondary | ICD-10-CM | POA: Diagnosis not present

## 2023-09-25 DIAGNOSIS — I69254 Hemiplegia and hemiparesis following other nontraumatic intracranial hemorrhage affecting left non-dominant side: Secondary | ICD-10-CM | POA: Diagnosis not present

## 2023-09-25 DIAGNOSIS — H918X2 Other specified hearing loss, left ear: Secondary | ICD-10-CM | POA: Diagnosis not present

## 2023-09-25 DIAGNOSIS — I69298 Other sequelae of other nontraumatic intracranial hemorrhage: Secondary | ICD-10-CM | POA: Diagnosis not present

## 2023-09-25 DIAGNOSIS — I7121 Aneurysm of the ascending aorta, without rupture: Secondary | ICD-10-CM | POA: Diagnosis not present

## 2023-09-25 DIAGNOSIS — F324 Major depressive disorder, single episode, in partial remission: Secondary | ICD-10-CM | POA: Diagnosis not present

## 2023-09-25 DIAGNOSIS — I129 Hypertensive chronic kidney disease with stage 1 through stage 4 chronic kidney disease, or unspecified chronic kidney disease: Secondary | ICD-10-CM | POA: Diagnosis not present

## 2023-09-25 DIAGNOSIS — H543 Unqualified visual loss, both eyes: Secondary | ICD-10-CM | POA: Diagnosis not present

## 2023-09-25 DIAGNOSIS — D631 Anemia in chronic kidney disease: Secondary | ICD-10-CM | POA: Diagnosis not present

## 2023-09-25 DIAGNOSIS — N1831 Chronic kidney disease, stage 3a: Secondary | ICD-10-CM | POA: Diagnosis not present

## 2023-09-26 DIAGNOSIS — F324 Major depressive disorder, single episode, in partial remission: Secondary | ICD-10-CM | POA: Diagnosis not present

## 2023-09-26 DIAGNOSIS — I69298 Other sequelae of other nontraumatic intracranial hemorrhage: Secondary | ICD-10-CM | POA: Diagnosis not present

## 2023-09-26 DIAGNOSIS — D631 Anemia in chronic kidney disease: Secondary | ICD-10-CM | POA: Diagnosis not present

## 2023-09-26 DIAGNOSIS — N1831 Chronic kidney disease, stage 3a: Secondary | ICD-10-CM | POA: Diagnosis not present

## 2023-09-26 DIAGNOSIS — H543 Unqualified visual loss, both eyes: Secondary | ICD-10-CM | POA: Diagnosis not present

## 2023-09-26 DIAGNOSIS — I7121 Aneurysm of the ascending aorta, without rupture: Secondary | ICD-10-CM | POA: Diagnosis not present

## 2023-09-26 DIAGNOSIS — I69254 Hemiplegia and hemiparesis following other nontraumatic intracranial hemorrhage affecting left non-dominant side: Secondary | ICD-10-CM | POA: Diagnosis not present

## 2023-09-26 DIAGNOSIS — I129 Hypertensive chronic kidney disease with stage 1 through stage 4 chronic kidney disease, or unspecified chronic kidney disease: Secondary | ICD-10-CM | POA: Diagnosis not present

## 2023-09-26 DIAGNOSIS — H918X2 Other specified hearing loss, left ear: Secondary | ICD-10-CM | POA: Diagnosis not present

## 2023-09-30 DIAGNOSIS — N1831 Chronic kidney disease, stage 3a: Secondary | ICD-10-CM | POA: Diagnosis not present

## 2023-09-30 DIAGNOSIS — H918X2 Other specified hearing loss, left ear: Secondary | ICD-10-CM | POA: Diagnosis not present

## 2023-09-30 DIAGNOSIS — I69298 Other sequelae of other nontraumatic intracranial hemorrhage: Secondary | ICD-10-CM | POA: Diagnosis not present

## 2023-09-30 DIAGNOSIS — H543 Unqualified visual loss, both eyes: Secondary | ICD-10-CM | POA: Diagnosis not present

## 2023-09-30 DIAGNOSIS — I129 Hypertensive chronic kidney disease with stage 1 through stage 4 chronic kidney disease, or unspecified chronic kidney disease: Secondary | ICD-10-CM | POA: Diagnosis not present

## 2023-09-30 DIAGNOSIS — I7121 Aneurysm of the ascending aorta, without rupture: Secondary | ICD-10-CM | POA: Diagnosis not present

## 2023-09-30 DIAGNOSIS — F324 Major depressive disorder, single episode, in partial remission: Secondary | ICD-10-CM | POA: Diagnosis not present

## 2023-09-30 DIAGNOSIS — D631 Anemia in chronic kidney disease: Secondary | ICD-10-CM | POA: Diagnosis not present

## 2023-09-30 DIAGNOSIS — I69254 Hemiplegia and hemiparesis following other nontraumatic intracranial hemorrhage affecting left non-dominant side: Secondary | ICD-10-CM | POA: Diagnosis not present

## 2023-10-01 DIAGNOSIS — H543 Unqualified visual loss, both eyes: Secondary | ICD-10-CM | POA: Diagnosis not present

## 2023-10-01 DIAGNOSIS — I129 Hypertensive chronic kidney disease with stage 1 through stage 4 chronic kidney disease, or unspecified chronic kidney disease: Secondary | ICD-10-CM | POA: Diagnosis not present

## 2023-10-01 DIAGNOSIS — D631 Anemia in chronic kidney disease: Secondary | ICD-10-CM | POA: Diagnosis not present

## 2023-10-01 DIAGNOSIS — I69254 Hemiplegia and hemiparesis following other nontraumatic intracranial hemorrhage affecting left non-dominant side: Secondary | ICD-10-CM | POA: Diagnosis not present

## 2023-10-01 DIAGNOSIS — H918X2 Other specified hearing loss, left ear: Secondary | ICD-10-CM | POA: Diagnosis not present

## 2023-10-01 DIAGNOSIS — F324 Major depressive disorder, single episode, in partial remission: Secondary | ICD-10-CM | POA: Diagnosis not present

## 2023-10-01 DIAGNOSIS — I69298 Other sequelae of other nontraumatic intracranial hemorrhage: Secondary | ICD-10-CM | POA: Diagnosis not present

## 2023-10-01 DIAGNOSIS — N1831 Chronic kidney disease, stage 3a: Secondary | ICD-10-CM | POA: Diagnosis not present

## 2023-10-01 DIAGNOSIS — I7121 Aneurysm of the ascending aorta, without rupture: Secondary | ICD-10-CM | POA: Diagnosis not present

## 2023-10-02 DIAGNOSIS — N1831 Chronic kidney disease, stage 3a: Secondary | ICD-10-CM | POA: Diagnosis not present

## 2023-10-02 DIAGNOSIS — E876 Hypokalemia: Secondary | ICD-10-CM | POA: Diagnosis not present

## 2023-10-02 DIAGNOSIS — I10 Essential (primary) hypertension: Secondary | ICD-10-CM | POA: Diagnosis not present

## 2023-10-02 DIAGNOSIS — N35812 Other urethral bulbous stricture, male: Secondary | ICD-10-CM | POA: Diagnosis not present

## 2023-10-02 DIAGNOSIS — G4733 Obstructive sleep apnea (adult) (pediatric): Secondary | ICD-10-CM | POA: Diagnosis not present

## 2023-10-02 DIAGNOSIS — R7303 Prediabetes: Secondary | ICD-10-CM | POA: Diagnosis not present

## 2023-10-07 DIAGNOSIS — I7121 Aneurysm of the ascending aorta, without rupture: Secondary | ICD-10-CM | POA: Diagnosis not present

## 2023-10-07 DIAGNOSIS — N1831 Chronic kidney disease, stage 3a: Secondary | ICD-10-CM | POA: Diagnosis not present

## 2023-10-07 DIAGNOSIS — I69298 Other sequelae of other nontraumatic intracranial hemorrhage: Secondary | ICD-10-CM | POA: Diagnosis not present

## 2023-10-07 DIAGNOSIS — H918X2 Other specified hearing loss, left ear: Secondary | ICD-10-CM | POA: Diagnosis not present

## 2023-10-07 DIAGNOSIS — D631 Anemia in chronic kidney disease: Secondary | ICD-10-CM | POA: Diagnosis not present

## 2023-10-07 DIAGNOSIS — I69254 Hemiplegia and hemiparesis following other nontraumatic intracranial hemorrhage affecting left non-dominant side: Secondary | ICD-10-CM | POA: Diagnosis not present

## 2023-10-07 DIAGNOSIS — I129 Hypertensive chronic kidney disease with stage 1 through stage 4 chronic kidney disease, or unspecified chronic kidney disease: Secondary | ICD-10-CM | POA: Diagnosis not present

## 2023-10-07 DIAGNOSIS — F324 Major depressive disorder, single episode, in partial remission: Secondary | ICD-10-CM | POA: Diagnosis not present

## 2023-10-07 DIAGNOSIS — H543 Unqualified visual loss, both eyes: Secondary | ICD-10-CM | POA: Diagnosis not present

## 2023-10-07 NOTE — Progress Notes (Signed)
 Letter of medical necessity received  from [facility] for CIC supplies. Form and medical records faxed back as requested.

## 2023-10-08 DIAGNOSIS — I69254 Hemiplegia and hemiparesis following other nontraumatic intracranial hemorrhage affecting left non-dominant side: Secondary | ICD-10-CM | POA: Diagnosis not present

## 2023-10-08 DIAGNOSIS — F324 Major depressive disorder, single episode, in partial remission: Secondary | ICD-10-CM | POA: Diagnosis not present

## 2023-10-08 DIAGNOSIS — H543 Unqualified visual loss, both eyes: Secondary | ICD-10-CM | POA: Diagnosis not present

## 2023-10-08 DIAGNOSIS — I69298 Other sequelae of other nontraumatic intracranial hemorrhage: Secondary | ICD-10-CM | POA: Diagnosis not present

## 2023-10-08 DIAGNOSIS — I129 Hypertensive chronic kidney disease with stage 1 through stage 4 chronic kidney disease, or unspecified chronic kidney disease: Secondary | ICD-10-CM | POA: Diagnosis not present

## 2023-10-08 DIAGNOSIS — N1831 Chronic kidney disease, stage 3a: Secondary | ICD-10-CM | POA: Diagnosis not present

## 2023-10-08 DIAGNOSIS — D631 Anemia in chronic kidney disease: Secondary | ICD-10-CM | POA: Diagnosis not present

## 2023-10-08 DIAGNOSIS — I7121 Aneurysm of the ascending aorta, without rupture: Secondary | ICD-10-CM | POA: Diagnosis not present

## 2023-10-08 DIAGNOSIS — H918X2 Other specified hearing loss, left ear: Secondary | ICD-10-CM | POA: Diagnosis not present

## 2023-10-14 DIAGNOSIS — H918X2 Other specified hearing loss, left ear: Secondary | ICD-10-CM | POA: Diagnosis not present

## 2023-10-14 DIAGNOSIS — H543 Unqualified visual loss, both eyes: Secondary | ICD-10-CM | POA: Diagnosis not present

## 2023-10-14 DIAGNOSIS — N1831 Chronic kidney disease, stage 3a: Secondary | ICD-10-CM | POA: Diagnosis not present

## 2023-10-14 DIAGNOSIS — I69298 Other sequelae of other nontraumatic intracranial hemorrhage: Secondary | ICD-10-CM | POA: Diagnosis not present

## 2023-10-14 DIAGNOSIS — F324 Major depressive disorder, single episode, in partial remission: Secondary | ICD-10-CM | POA: Diagnosis not present

## 2023-10-14 DIAGNOSIS — I129 Hypertensive chronic kidney disease with stage 1 through stage 4 chronic kidney disease, or unspecified chronic kidney disease: Secondary | ICD-10-CM | POA: Diagnosis not present

## 2023-10-14 DIAGNOSIS — D631 Anemia in chronic kidney disease: Secondary | ICD-10-CM | POA: Diagnosis not present

## 2023-10-14 DIAGNOSIS — I69254 Hemiplegia and hemiparesis following other nontraumatic intracranial hemorrhage affecting left non-dominant side: Secondary | ICD-10-CM | POA: Diagnosis not present

## 2023-10-14 DIAGNOSIS — I7121 Aneurysm of the ascending aorta, without rupture: Secondary | ICD-10-CM | POA: Diagnosis not present

## 2023-10-23 DIAGNOSIS — I129 Hypertensive chronic kidney disease with stage 1 through stage 4 chronic kidney disease, or unspecified chronic kidney disease: Secondary | ICD-10-CM | POA: Diagnosis not present

## 2023-10-23 DIAGNOSIS — I69254 Hemiplegia and hemiparesis following other nontraumatic intracranial hemorrhage affecting left non-dominant side: Secondary | ICD-10-CM | POA: Diagnosis not present

## 2023-10-23 DIAGNOSIS — D631 Anemia in chronic kidney disease: Secondary | ICD-10-CM | POA: Diagnosis not present

## 2023-10-23 DIAGNOSIS — F324 Major depressive disorder, single episode, in partial remission: Secondary | ICD-10-CM | POA: Diagnosis not present

## 2023-10-23 DIAGNOSIS — H543 Unqualified visual loss, both eyes: Secondary | ICD-10-CM | POA: Diagnosis not present

## 2023-10-23 DIAGNOSIS — I7121 Aneurysm of the ascending aorta, without rupture: Secondary | ICD-10-CM | POA: Diagnosis not present

## 2023-10-23 DIAGNOSIS — I69298 Other sequelae of other nontraumatic intracranial hemorrhage: Secondary | ICD-10-CM | POA: Diagnosis not present

## 2023-10-23 DIAGNOSIS — H918X2 Other specified hearing loss, left ear: Secondary | ICD-10-CM | POA: Diagnosis not present

## 2023-10-23 DIAGNOSIS — N1831 Chronic kidney disease, stage 3a: Secondary | ICD-10-CM | POA: Diagnosis not present

## 2023-10-24 DIAGNOSIS — N393 Stress incontinence (female) (male): Secondary | ICD-10-CM | POA: Diagnosis not present

## 2024-01-01 ENCOUNTER — Observation Stay (HOSPITAL_BASED_OUTPATIENT_CLINIC_OR_DEPARTMENT_OTHER)
Admission: EM | Admit: 2024-01-01 | Discharge: 2024-01-03 | Disposition: A | Discharge status: Home / Self Care | Attending: Internal Medicine | Admitting: Internal Medicine

## 2024-01-01 ENCOUNTER — Other Ambulatory Visit: Payer: Self-pay

## 2024-01-01 ENCOUNTER — Observation Stay (HOSPITAL_COMMUNITY)

## 2024-01-01 ENCOUNTER — Encounter (HOSPITAL_BASED_OUTPATIENT_CLINIC_OR_DEPARTMENT_OTHER): Payer: Self-pay

## 2024-01-01 DIAGNOSIS — E66811 Obesity, class 1: Secondary | ICD-10-CM | POA: Diagnosis present

## 2024-01-01 DIAGNOSIS — Z531 Procedure and treatment not carried out because of patient's decision for reasons of belief and group pressure: Secondary | ICD-10-CM | POA: Insufficient documentation

## 2024-01-01 DIAGNOSIS — N390 Urinary tract infection, site not specified: Secondary | ICD-10-CM | POA: Diagnosis present

## 2024-01-01 DIAGNOSIS — I129 Hypertensive chronic kidney disease with stage 1 through stage 4 chronic kidney disease, or unspecified chronic kidney disease: Secondary | ICD-10-CM | POA: Diagnosis not present

## 2024-01-01 DIAGNOSIS — I7121 Aneurysm of the ascending aorta, without rupture: Secondary | ICD-10-CM | POA: Diagnosis present

## 2024-01-01 DIAGNOSIS — Z6831 Body mass index (BMI) 31.0-31.9, adult: Secondary | ICD-10-CM | POA: Diagnosis not present

## 2024-01-01 DIAGNOSIS — E669 Obesity, unspecified: Secondary | ICD-10-CM | POA: Diagnosis present

## 2024-01-01 DIAGNOSIS — Z79899 Other long term (current) drug therapy: Secondary | ICD-10-CM | POA: Diagnosis not present

## 2024-01-01 DIAGNOSIS — G4733 Obstructive sleep apnea (adult) (pediatric): Secondary | ICD-10-CM | POA: Diagnosis not present

## 2024-01-01 DIAGNOSIS — R0989 Other specified symptoms and signs involving the circulatory and respiratory systems: Secondary | ICD-10-CM | POA: Diagnosis not present

## 2024-01-01 DIAGNOSIS — R059 Cough, unspecified: Secondary | ICD-10-CM | POA: Diagnosis not present

## 2024-01-01 DIAGNOSIS — R2689 Other abnormalities of gait and mobility: Secondary | ICD-10-CM | POA: Insufficient documentation

## 2024-01-01 DIAGNOSIS — N1832 Chronic kidney disease, stage 3b: Secondary | ICD-10-CM | POA: Insufficient documentation

## 2024-01-01 DIAGNOSIS — K922 Gastrointestinal hemorrhage, unspecified: Secondary | ICD-10-CM | POA: Diagnosis not present

## 2024-01-01 DIAGNOSIS — D72829 Elevated white blood cell count, unspecified: Secondary | ICD-10-CM | POA: Insufficient documentation

## 2024-01-01 DIAGNOSIS — N179 Acute kidney failure, unspecified: Secondary | ICD-10-CM | POA: Diagnosis not present

## 2024-01-01 DIAGNOSIS — Z7982 Long term (current) use of aspirin: Secondary | ICD-10-CM | POA: Diagnosis not present

## 2024-01-01 DIAGNOSIS — Z8673 Personal history of transient ischemic attack (TIA), and cerebral infarction without residual deficits: Secondary | ICD-10-CM | POA: Diagnosis not present

## 2024-01-01 DIAGNOSIS — K625 Hemorrhage of anus and rectum: Secondary | ICD-10-CM | POA: Diagnosis not present

## 2024-01-01 DIAGNOSIS — I10 Essential (primary) hypertension: Secondary | ICD-10-CM | POA: Diagnosis present

## 2024-01-01 LAB — URINALYSIS, ROUTINE W REFLEX MICROSCOPIC
Bilirubin Urine: NEGATIVE
Glucose, UA: NEGATIVE mg/dL
Nitrite: POSITIVE — AB
Protein, ur: 30 mg/dL — AB
Specific Gravity, Urine: 1.012 (ref 1.005–1.030)
WBC, UA: >50 WBC/hpf (ref 0–5)
pH: 7 (ref 5.0–8.0)

## 2024-01-01 LAB — CBC
HCT: 43.8 % (ref 39.0–52.0)
Hemoglobin: 14 g/dL (ref 13.0–17.0)
MCH: 28.6 pg (ref 26.0–34.0)
MCHC: 32 g/dL (ref 30.0–36.0)
MCV: 89.4 fL (ref 80.0–100.0)
Platelets: 215 K/uL (ref 150–400)
RBC: 4.9 MIL/uL (ref 4.22–5.81)
RDW: 14.2 % (ref 11.5–15.5)
WBC: 12.2 K/uL — ABNORMAL HIGH (ref 4.0–10.5)
nRBC: 0 % (ref 0.0–0.2)

## 2024-01-01 LAB — OCCULT BLOOD X 1 CARD TO LAB, STOOL: Fecal Occult Bld: POSITIVE — AB

## 2024-01-01 LAB — BASIC METABOLIC PANEL WITH GFR
Anion gap: 15 (ref 5–15)
BUN: 31 mg/dL — ABNORMAL HIGH (ref 8–23)
CO2: 24 mmol/L (ref 22–32)
Calcium: 10.8 mg/dL — ABNORMAL HIGH (ref 8.9–10.3)
Chloride: 104 mmol/L (ref 98–111)
Creatinine, Ser: 2.07 mg/dL — ABNORMAL HIGH (ref 0.61–1.24)
GFR, Estimated: 33 mL/min — ABNORMAL LOW (ref >60)
Glucose, Bld: 122 mg/dL — ABNORMAL HIGH (ref 70–99)
Potassium: 3.6 mmol/L (ref 3.5–5.1)
Sodium: 143 mmol/L (ref 135–145)

## 2024-01-01 LAB — HEPATIC FUNCTION PANEL
ALT: 14 U/L (ref 0–44)
AST: 14 U/L — ABNORMAL LOW (ref 15–41)
Albumin: 3.5 g/dL (ref 3.5–5.0)
Alkaline Phosphatase: 46 U/L (ref 38–126)
Bilirubin, Direct: <0.1 mg/dL (ref 0.0–0.2)
Total Bilirubin: 0.8 mg/dL (ref 0.0–1.2)
Total Protein: 6.5 g/dL (ref 6.5–8.1)

## 2024-01-01 LAB — HEMOGLOBIN AND HEMATOCRIT, BLOOD
HCT: 41.9 % (ref 39.0–52.0)
HCT: 41 % (ref 39.0–52.0)
HCT: 39.9 % (ref 39.0–52.0)
Hemoglobin: 13.1 g/dL (ref 13.0–17.0)
Hemoglobin: 12.8 g/dL — ABNORMAL LOW (ref 13.0–17.0)
Hemoglobin: 12.6 g/dL — ABNORMAL LOW (ref 13.0–17.0)

## 2024-01-01 MED ORDER — SODIUM CHLORIDE 0.9 % IV SOLN: 2.0000 g | INTRAVENOUS | Status: DC

## 2024-01-01 MED ORDER — SODIUM CHLORIDE 0.9 % IV SOLN: INTRAVENOUS | Status: DC

## 2024-01-01 MED ORDER — PANTOPRAZOLE SODIUM 40 MG IV SOLR
40.0000 mg | Freq: Two times a day (BID) | INTRAVENOUS | Status: DC
  Administered 2024-01-01 – 2024-01-03 (×4): 40 mg via INTRAVENOUS
  Filled 2024-01-01 (×4): qty 10

## 2024-01-01 MED ORDER — ONDANSETRON HCL 4 MG/2ML IJ SOLN: 4.0000 mg | Freq: Four times a day (QID) | INTRAMUSCULAR | Status: DC | PRN

## 2024-01-01 MED ORDER — SODIUM CHLORIDE 0.9 % IV BOLUS
250.0000 mL | Freq: Once | INTRAVENOUS | Status: AC
  Administered 2024-01-01: 250 mL via INTRAVENOUS

## 2024-01-01 MED ORDER — ONDANSETRON HCL 4 MG PO TABS: 4.0000 mg | ORAL_TABLET | Freq: Four times a day (QID) | ORAL | Status: DC | PRN

## 2024-01-01 MED ORDER — ORAL CARE MOUTH RINSE: 15.0000 mL | OROMUCOSAL | Status: DC | PRN

## 2024-01-01 MED ORDER — LACTATED RINGERS IV SOLN: INTRAVENOUS | Status: DC

## 2024-01-01 MED ORDER — FENTANYL CITRATE PF 50 MCG/ML IJ SOSY
25.0000 ug | PREFILLED_SYRINGE | Freq: Once | INTRAMUSCULAR | Status: AC
  Administered 2024-01-01: 25 ug via INTRAVENOUS
  Filled 2024-01-01: qty 1

## 2024-01-01 MED ORDER — SODIUM CHLORIDE 0.9 % IV BOLUS
500.0000 mL | Freq: Once | INTRAVENOUS | Status: AC
  Administered 2024-01-01: 500 mL via INTRAVENOUS

## 2024-01-01 MED ORDER — FENTANYL CITRATE PF 50 MCG/ML IJ SOSY
50.0000 ug | PREFILLED_SYRINGE | INTRAMUSCULAR | Status: AC | PRN
  Administered 2024-01-01 (×2): 50 ug via INTRAVENOUS
  Filled 2024-01-01 (×2): qty 1

## 2024-01-01 MED ORDER — AMLODIPINE BESYLATE 10 MG PO TABS
10.0000 mg | ORAL_TABLET | Freq: Every evening | ORAL | Status: DC
  Administered 2024-01-01 – 2024-01-02 (×2): 10 mg via ORAL
  Filled 2024-01-01 (×2): qty 1

## 2024-01-01 MED ORDER — OXYBUTYNIN CHLORIDE 5 MG PO TABS
5.0000 mg | ORAL_TABLET | Freq: Three times a day (TID) | ORAL | Status: DC
  Administered 2024-01-01 – 2024-01-03 (×7): 5 mg via ORAL
  Filled 2024-01-01 (×9): qty 1

## 2024-01-01 MED ORDER — SODIUM CHLORIDE 0.9 % IV SOLN
1.0000 g | INTRAVENOUS | Status: DC
  Administered 2024-01-01 – 2024-01-02 (×2): 1 g via INTRAVENOUS
  Filled 2024-01-01 (×2): qty 10

## 2024-01-01 MED ORDER — PANTOPRAZOLE SODIUM 40 MG IV SOLR
40.0000 mg | INTRAVENOUS | Status: AC
  Administered 2024-01-01 (×2): 40 mg via INTRAVENOUS
  Filled 2024-01-01: qty 10

## 2024-01-01 MED ORDER — ACETAMINOPHEN 325 MG PO TABS
650.0000 mg | ORAL_TABLET | Freq: Four times a day (QID) | ORAL | Status: DC | PRN
  Administered 2024-01-02: 650 mg via ORAL
  Filled 2024-01-01: qty 2

## 2024-01-01 MED ORDER — FENTANYL CITRATE PF 50 MCG/ML IJ SOSY
50.0000 ug | PREFILLED_SYRINGE | Freq: Once | INTRAMUSCULAR | Status: DC
  Filled 2024-01-01: qty 1

## 2024-01-01 MED ORDER — HYDRALAZINE HCL 20 MG/ML IJ SOLN
10.0000 mg | Freq: Four times a day (QID) | INTRAMUSCULAR | Status: DC | PRN
  Administered 2024-01-02: 10 mg via INTRAVENOUS
  Filled 2024-01-01 (×2): qty 1

## 2024-01-01 MED ORDER — ACETAMINOPHEN 650 MG RE SUPP: 650.0000 mg | Freq: Four times a day (QID) | RECTAL | Status: DC | PRN

## 2024-01-01 NOTE — Assessment & Plan Note (Addendum)
NO BLOOD PRODUCTS 

## 2024-01-01 NOTE — ED Notes (Signed)
 Report given to the Floor RN.Marland KitchenMarland Kitchen

## 2024-01-01 NOTE — ED Notes (Signed)
-  [facility] called EDP to advise to repage admission consult after 7am.

## 2024-01-01 NOTE — Assessment & Plan Note (Addendum)
 Pt has h/o CVA and h/o SDH. No aspirin . Exam is nonfocal , but strength is decreased and will benefit from aggressive PT.  Last MRI was 07/2021 and was negative for acute infarct. Full report shows Chronic infarct right occipital lobe. Chronic microvascular ischemic  change in the white matter. Chronic infarct in the pons and left  middle cerebellar peduncle. Negative for hemorrhage or mass. Generalized atrophy. Vascular: Normal arterial flow voids. Skull and upper cervical spine: No focal skeletal lesion. Sinuses/Orbits: Mild mucosal edema paranasal sinuses. Bilateral  cataract extraction

## 2024-01-01 NOTE — Progress Notes (Signed)
 Received report from patient transfer from [facility] ED.

## 2024-01-01 NOTE — Assessment & Plan Note (Addendum)
 Urine culture. Rocephin  2 gm iv x 7 days.

## 2024-01-01 NOTE — Assessment & Plan Note (Addendum)
 Vitals:   01/01/24 0513 01/01/24 0516 01/01/24 0600 01/01/24 0630  BP: (!) 169/93 137/76 (!) 153/90 (!) 170/87   01/01/24 0732 01/01/24 0930 01/01/24 1000 01/01/24 1115  BP: (!) 170/95 (!) 146/81 (!) 143/72 (!) 154/86   01/01/24 1157  BP: (!) 181/97  Cont with amlodipine  10 mg. PRN Hydralazine .  EKG.

## 2024-01-01 NOTE — Assessment & Plan Note (Addendum)
 Lab Results  Component Value Date   CREATININE 2.07 (H) 01/01/2024   CREATININE 1.63 (H) 02/18/2022   CREATININE 1.59 (H) 12/15/2021  Bladder q 4h and PRN. Urology consult as needed.  MIVF with LR at 50 cc.

## 2024-01-01 NOTE — ED Notes (Signed)
 Hemacuolt card collected by prev RN.[email or web address]

## 2024-01-01 NOTE — H&P (Addendum)
 History and Physical    Patient: Nicholas Mckenzie FMW:[id number] DOB: Oct 16, 1945 DOA: 01/01/2024 DOS: the patient was seen and examined on 01/01/2024 . PCP: Vernon Velna SAUNDERS, MD  Patient coming from: [facility] Chief complaint: Chief Complaint  Patient presents with   Rectal Bleeding   Urinary Incontinence   HPI:  Nicholas Mckenzie is a 78 y.o. 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  No reports of chest pain dizziness presyncope loss of consciousness falls chest pain palpitation or any other symptoms.  ED Course : Vital signs in the ED were notable for the following: Stable afebrile O2 sats 96% on room air. Vitals:   01/01/24 1000 01/01/24 1115 01/01/24 1157 01/01/24 1211  BP: (!) 143/72 (!) 154/86 (!) 181/97   Pulse: 64 61 64   Temp:   98.8 F (37.1 C)   Resp: 11 13 16    Height:    5' 8 (1.727 m)  Weight:    95 kg  SpO2: 90% 94% 96%   TempSrc:   Oral   BMI (Calculated):    31.85  >>ED evaluation thus far shows: Basic metabolic panel shows glucose 122 BUN of 31 creatinine 2.07 calcium  10.8 EGFR 33. CBC shows white count of 12.2 otherwise normal hemoglobin and platelets. Abnormal urinalysis showing large Urine analysis shows large leukocytes and nitrite more than 50 WBCs. Stool occult is positive.  >>While in the ED patient received the following: Medications  pantoprazole  (PROTONIX ) injection 40 mg (40 mg Intravenous Given 01/01/24 9366)    Followed by  pantoprazole  (PROTONIX ) injection 40 mg (has no administration in time range)  oxybutynin  (DITROPAN ) tablet 5 mg (5 mg Oral Given 01/01/24  0747)  fentaNYL  (SUBLIMAZE ) injection 50 mcg (50 mcg Intravenous Given 01/01/24 0950)  Oral care mouth rinse (has no administration in time range)  sodium chloride  0.9 % bolus 500 mL (0 mLs Intravenous Stopped 01/01/24 0734)  fentaNYL  (SUBLIMAZE ) injection 25 mcg (25 mcg Intravenous Given 01/01/24 0645)    Review of Systems  Gastrointestinal:  Positive for blood in stool.  Genitourinary:        Decreased urine output.    Past Medical History:  Diagnosis Date   Acute respiratory failure with hypoxia (HCC) 08/16/2021   Aspiration pneumonia (HCC) 08/15/2021   CKD (chronic kidney disease) stage 3, GFR 30-59 ml/min (HCC)    COVID-19 virus infection 06/06/2021   COVID-19 Labs     No results for input(s): DDIMER, FERRITIN, LDH, CRP in the last 72 hours.             Lab Results      Component    Value    Date           SARSCOV2NAA    POSITIVE (A)    06/06/2021           SARSCOV2NAA    NEGATIVE    04/12/2021           SARSCOV2NAA    NEGATIVE    04/01/2021           SARSCOV2NAA    NEGATIVE    03/11/2021  Hypertension    Kidney stones    Severe sepsis (HCC) 08/16/2021   Sleep apnea    Subdural hematoma (HCC) 06/06/2021   Past Surgical History:  Procedure Laterality Date   BLADDER STONE REMOVAL  12/2021   CATARACT EXTRACTION     IR NEPHROSTOMY EXCHANGE RIGHT  06/07/2021   IR NEPHROSTOMY EXCHANGE RIGHT  08/22/2021   IR NEPHROSTOMY PLACEMENT RIGHT  04/07/2021   IR PATIENT EVAL TECH 0-60 MINS  06/02/2021   kidney stent     MANDIBLE FRACTURE SURGERY     NEPHRECTOMY Right 01/07/2022    reports that he has never smoked. He has never used smokeless tobacco. He reports current alcohol  use. No history on file for drug use. Allergies  Allergen Reactions   Statins Diarrhea and Other (See Comments)    Myalgias Myopathy   Other Other (See Comments)    No blood products - religious observance ([employer])   Tape Rash   Family History  Problem Relation Age of Onset   Hypertension  Mother    Hypertension Other    Prior to Admission medications   Medication Sig Start Date End Date Taking? Authorizing Provider  amLODipine  (NORVASC ) 10 MG tablet Take 10 mg by mouth daily. 10/17/21  Yes [provider]  acetaminophen  (TYLENOL ) 325 MG tablet Take 2 tablets (650 mg total) by mouth every 6 (six) hours as needed for mild pain (or Fever >/= 101). 08/25/21   Sebastian Toribio GAILS, MD  amLODipine  (NORVASC ) 5 MG tablet Take 1 tablet (5 mg total) by mouth in the morning and at bedtime. Patient taking differently: Take 10 mg by mouth once. Pt reports taking once in AM 06/08/21   Hongalgi, Trenda BIRCH, MD  aspirin  EC 81 MG tablet Take 1 tablet (81 mg total) by mouth daily. Swallow whole. 01/31/22   Rosemarie Eather RAMAN, MD  B Complex-C CAPS Take 1 capsule by mouth daily.    [provider]  Evolocumab  (REPATHA  SURECLICK) 140 MG/ML SOAJ Inject 140 mg into the skin every 14 (fourteen) days. 02/04/22   Rosemarie Eather RAMAN, MD  FLUoxetine  (PROZAC ) 40 MG capsule Take 40 mg by mouth daily.    [provider]  lactobacillus (FLORANEX/LACTINEX) PACK Take 1 packet (1 g total) by mouth 2 (two) times daily. 04/13/21   Christobal Guadalajara, MD  melatonin 3 MG TABS tablet Take 3 mg by mouth at bedtime.    [provider]  Multiple Vitamins-Minerals (MULTIVITAMIN WITH MINERALS) tablet Take 1 tablet by mouth daily.    [provider]  Omega-3 Fatty Acids (FISH OIL PO) Take 1 capsule by mouth daily.    [provider]                                                                                 Vitals:   01/01/24 1000 01/01/24 1115 01/01/24 1157 01/01/24 1211  BP: (!) 143/72 (!) 154/86 (!) 181/97   Pulse: 64 61 64   Resp: 11 13 16    Temp:   98.8 F (37.1 C)   TempSrc:   Oral   SpO2: 90% 94% 96%   Weight:    95 kg  Height:  5' 8 (1.727 m)   Physical Exam Vitals and nursing note reviewed.  Constitutional:      General: He is not in acute distress. HENT:     Head:  Normocephalic and atraumatic.     Right Ear: Hearing normal.     Left Ear: Hearing normal.     Nose: No nasal deformity.     Mouth/Throat:     Lips: Pink.  Eyes:     General: Lids are normal.     Extraocular Movements: Extraocular movements intact.  Cardiovascular:     Rate and Rhythm: Normal rate and regular rhythm.     Heart sounds: Normal heart sounds.  Pulmonary:     Effort: Pulmonary effort is normal.     Breath sounds: Normal breath sounds.  Abdominal:     General: Bowel sounds are normal. There is no distension.     Palpations: Abdomen is soft. There is no mass.     Tenderness: There is no abdominal tenderness.  Musculoskeletal:     Right lower leg: No edema.     Left lower leg: No edema.  Skin:    General: Skin is warm.  Neurological:     General: No focal deficit present.     Mental Status: He is alert and oriented to person, place, and time.     Cranial Nerves: Cranial nerves 2-12 are intact.  Psychiatric:        Speech: Speech normal.     Labs on Admission: I have personally reviewed following labs and imaging studies CBC: Recent Labs  Lab 01/01/24 0521  WBC 12.2*  HGB 14.0  HCT 43.8  MCV 89.4  PLT 215   Basic Metabolic Panel: Recent Labs  Lab 01/01/24 0546  NA 143  K 3.6  CL 104  CO2 24  GLUCOSE 122*  BUN 31*  CREATININE 2.07*  CALCIUM  10.8*   GFR: Estimated Creatinine Clearance: 33.9 mL/min (A) (by C-G formula based on SCr of 2.07 mg/dL (H)). Liver Function Tests: No results for input(s): AST, ALT, ALKPHOS, BILITOT, PROT, ALBUMIN in the last 168 hours. No results for input(s): LIPASE, AMYLASE in the last 168 hours. No results for input(s): AMMONIA in the last 168 hours. Coagulation Profile: No results for input(s): INR, PROTIME in the last 168 hours. Cardiac Enzymes: No results for input(s): CKTOTAL, CKMB, CKMBINDEX, TROPONINI in the last 168 hours. BNP (last 3 results) No results for input(s): PROBNP in  the last 8760 hours. HbA1C: No results for input(s): HGBA1C in the last 72 hours. CBG: No results for input(s): GLUCAP in the last 168 hours. Lipid Profile: No results for input(s): CHOL, HDL, LDLCALC, TRIG, CHOLHDL, LDLDIRECT in the last 72 hours. Thyroid  Function Tests: No results for input(s): TSH, T4TOTAL, FREET4, T3FREE, THYROIDAB in the last 72 hours. Anemia Panel: No results for input(s): VITAMINB12, FOLATE, FERRITIN, TIBC, IRON, RETICCTPCT in the last 72 hours. Urine analysis:    Component Value Date/Time   COLORURINE YELLOW 01/01/2024 0800   APPEARANCEUR HAZY (A) 01/01/2024 0800   LABSPEC 1.012 01/01/2024 0800   PHURINE 7.0 01/01/2024 0800   GLUCOSEU NEGATIVE 01/01/2024 0800   HGBUR TRACE (A) 01/01/2024 0800   BILIRUBINUR NEGATIVE 01/01/2024 0800   KETONESUR TRACE (A) 01/01/2024 0800   PROTEINUR 30 (A) 01/01/2024 0800   NITRITE POSITIVE (A) 01/01/2024 0800   LEUKOCYTESUR LARGE (A) 01/01/2024 0800   Radiological Exams on Admission: No results found. Data Reviewed: Relevant notes from primary care and specialist visits, past discharge summaries as  available in EHR, including Care Everywhere . Prior diagnostic testing as pertinent to current admission diagnoses, Updated medications and problem lists for reconciliation .ED course, including vitals, labs, imaging, treatment and response to treatment,Triage notes, nursing and pharmacy notes and ED provider's notes.Notable results as noted in HPI.Discussed case with EDMD/ ED APP/ or Specialty MD on call and as needed.  Assessment & Plan GI bleed Pt had BRBPR in commode , no blood thinners. No asa. GI consulted.  IV PPI. NO BLOOD TRANSFUSION OR PRODUCTS.  PT IS [employer].   Transfusion of blood product refused for religious reason NO BLOOD PRODUCTS. UTI (urinary tract infection) Urine culture. Rocephin  2 gm iv x 7 days.  H/O ischemic right PCA stroke Pt has h/o CVA and h/o  SDH. No aspirin . Exam is nonfocal , but strength is decreased and will benefit from aggressive PT.  Last MRI was 07/2021 and was negative for acute infarct. Full report shows Chronic infarct right occipital lobe. Chronic microvascular ischemic  change in the white matter. Chronic infarct in the pons and left  middle cerebellar peduncle. Negative for hemorrhage or mass. Generalized atrophy. Vascular: Normal arterial flow voids. Skull and upper cervical spine: No focal skeletal lesion. Sinuses/Orbits: Mild mucosal edema paranasal sinuses. Bilateral  cataract extraction Obesity (BMI 30-39.9) Glucose is elevated we will check for A1c add on.  OSA (obstructive sleep apnea) CPAP per home settings.  Essential (primary) hypertension Vitals:   01/01/24 0513 01/01/24 0516 01/01/24 0600 01/01/24 0630  BP: (!) 169/93 137/76 (!) 153/90 (!) 170/87   01/01/24 0732 01/01/24 0930 01/01/24 1000 01/01/24 1115  BP: (!) 170/95 (!) 146/81 (!) 143/72 (!) 154/86   01/01/24 1157  BP: (!) 181/97  Cont with amlodipine  10 mg. PRN Hydralazine .  EKG.  Acute kidney injury superimposed on stage 3b chronic kidney disease (HCC) Lab Results  Component Value Date   CREATININE 2.07 (H) 01/01/2024   CREATININE 1.63 (H) 02/18/2022   CREATININE 1.59 (H) 12/15/2021  Bladder q 4h and PRN. Urology consult as needed.  MIVF with LR at 50 cc.    DVT prophylaxis:  SCD's  Consults:  GI. Advance Care Planning:    Code Status: Full Code   Family Communication:  Wife.  Disposition Plan:  Home.  Severity of Illness: The appropriate patient status for this patient is INPATIENT. Inpatient status is judged to be reasonable and necessary in order to provide the required intensity of service to ensure the patient's safety. The patient's presenting symptoms, physical exam findings, and initial radiographic and laboratory data in the context of their chronic comorbidities is felt to place them at high risk for further clinical  deterioration. Furthermore, it is not anticipated that the patient will be medically stable for discharge from the hospital within 2 midnights of admission.   * I certify that at the point of admission it is my clinical judgment that the patient will require inpatient hospital care spanning beyond 2 midnights from the point of admission due to high intensity of service, high risk for further deterioration and high frequency of surveillance required.*  Unresulted Labs (From admission, onward)     Start     Ordered   01/02/24 0500  Basic metabolic panel  Tomorrow morning,   R        01/01/24 1250   01/02/24 0500  CBC  Tomorrow morning,   R        01/01/24 1250   01/01/24 1312  Urine Culture (for pregnant, neutropenic or  urologic patients or patients with an indwelling urinary catheter)  (Urine Labs)  Once,   R       Question:  Indication  Answer:  Urgency/frequency   01/01/24 1323   01/01/24 1311  Hepatic function panel  Add-on,   AD        01/01/24 1310   01/01/24 1250  Hemoglobin and hematocrit, blood  Now then every 4 hours,   R      01/01/24 1250            Meds ordered this encounter  Medications   FOLLOWED BY Linked Order Group    pantoprazole  (PROTONIX ) injection 40 mg    pantoprazole  (PROTONIX ) injection 40 mg   sodium chloride  0.9 % bolus 500 mL   fentaNYL  (SUBLIMAZE ) injection 25 mcg   oxybutynin  (DITROPAN ) tablet 5 mg   DISCONTD: fentaNYL  (SUBLIMAZE ) injection 50 mcg   fentaNYL  (SUBLIMAZE ) injection 50 mcg   Oral care mouth rinse   lactated ringers  infusion   OR Linked Order Group    acetaminophen  (TYLENOL ) tablet 650 mg    acetaminophen  (TYLENOL ) suppository 650 mg   OR Linked Order Group    ondansetron  (ZOFRAN ) tablet 4 mg    ondansetron  (ZOFRAN ) injection 4 mg   DISCONTD: cefTRIAXone  (ROCEPHIN ) 2 g in sodium chloride  0.9 % 100 mL IVPB    Antibiotic Indication::   UTI   amLODipine  (NORVASC ) tablet 10 mg   cefTRIAXone  (ROCEPHIN ) 1 g in sodium chloride  0.9 % 100  mL IVPB    Antibiotic Indication::   UTI     Orders Placed This Encounter  Procedures   Urine Culture (for pregnant, neutropenic or urologic patients or patients with an indwelling urinary catheter)   Urinalysis, Routine w reflex microscopic -Urine, Clean Catch   CBC   Occult blood card to lab, stool Provider will collect   Basic metabolic panel with GFR   Basic metabolic panel   CBC   Hemoglobin and hematocrit, blood   Hepatic function panel   Diet clear liquid Room service appropriate? Yes; Fluid consistency: Thin   Diet NPO time specified   Bladder scan and document (for urinary retention)   Cardiac Monitoring - Continuous Indefinite   Initiate Oral Care Protocol   Complete oral care assessment tool on admission, transfer, and q shift   Refer to Sidebar Report Adult Oral Care Protocol   Brush teeth with toothbrush and toothpaste 3 times daily   Apply moisturizer in mouth and lips prn   Strict intake and output   SCDs   Patient has an active order for admit to inpatient/place in observation   Vital signs   Notify physician (specify)   Mobility Protocol: No Restrictions RN to initiate protocols based on patient's level of care   Refer to Sidebar Report Refer to ICU, Med-Surg, Progressive, and Step-Down Mobility Protocol Sidebars   Initiate Adult Central Line Maintenance and Catheter Protocol for patients with central line (CVC, PICC, Port, Hemodialysis, Trialysis)   If patient diabetic or glucose greater than 140 notify physician for Sliding Scale Insulin Orders   Intake and Output   Do not place and if present remove PureWick   Initiate Oral Care Protocol   Initiate Carrier Fluid Protocol   RN may order General Admission PRN Orders utilizing General Admission PRN medications (through manage orders) for the following patient needs: allergy symptoms (Claritin), cold sores (Carmex), cough (Robitussin DM), eye irritation (Liquifilm Tears), hemorrhoids (Tucks), indigestion  (Maalox), minor skin irritation (Hydrocortisone Cream), muscle  pain Lucienne Siad), nose irritation (saline nasal spray) and sore throat (Chloraseptic spray).   Ambulate with assistance   Bladder scan   Bladder scan   Full code   Consult to hospitalist   Pulse oximetry check with vital signs   Oxygen  therapy Mode or (Route): Nasal cannula; Liters Per Minute: 2; Keep O2 saturation between: greater than 92 %   POC occult blood, ED Provider will collect   EKG   EKG   Place in observation (patient's expected length of stay will be less than 2 midnights)   Aspiration precautions   Fall precautions    Author: Mario LULLA Blanch, MD 12 pm- 8 pm. [facility]. 01/01/2024 2:19 PM Please note for any communication after hours contact [facility] Assigned provider on call on Amion.

## 2024-01-01 NOTE — Assessment & Plan Note (Addendum)
 Glucose is elevated we will check for A1c add on.

## 2024-01-01 NOTE — Assessment & Plan Note (Addendum)
 Pt had BRBPR in commode , no blood thinners. No asa. GI consulted.  IV PPI. NO BLOOD TRANSFUSION OR PRODUCTS.  PT IS [employer].

## 2024-01-01 NOTE — Assessment & Plan Note (Addendum)
 CPAP per home settings.

## 2024-01-01 NOTE — ED Notes (Signed)
-  Called [facility] at 552am for hospitalist consult, spoke to [place].

## 2024-01-01 NOTE — ED Notes (Signed)
 Pt aware of the need for a urine... Unable to currently provide a sample.Nicholas KitchenMarland Mckenzie

## 2024-01-01 NOTE — Plan of Care (Addendum)
 Transfer from [facility] [name] is a 78 year old male with pmh HTN, nephrolithiasis, and CKD presents with complaints of urinary retention.  Bladder scan did not show significant urine present.  Urinalysis still needing to be collected.  However, found to have melena on rectal exam then bright red blood per rectum.  Labs noted WBC 12.2, hemoglobin 14, BUN 31, creatinine 2.07, and calcium  10.8.  Stool guaiacs were positive.  Dr. Burnette GI consulted.  Patient was bolused 500 mL of normal saline IV fluids, given fentanyl , and started on Protonix  IV.  Orders placed for observation to a medical telemetry bed.

## 2024-01-01 NOTE — ED Triage Notes (Signed)
 Pt arrives POV for urinary retention. States he has urinated very little over the last several days. Taking AZO pills for burning. Also reporting bloody bowel movements starting tonight.

## 2024-01-01 NOTE — Consult Note (Addendum)
 Consultation Note   Referring Provider:  [facility] PCP: Vernon Velna SAUNDERS, MD Primary Gastroenterologist: Sampson       Reason for Consultation: GI bleed DOA: 01/01/2024         Hospital Day: 1   ASSESSMENT    78 year old male with history of CKD3b, CVA , hypertension , anemia of chronic disease, recurrent UTIs admitted with bladder pain.   Chronic, intermittent painless rectal bleeding with bowel movements. Likely 2/2 to internal hemorrhoids.  No obvious rectal mass on DRE and had light brown stool in vault.  Hgb is normal at 13.   Reported black stool x 3 week.  He started a supplement with iron about 3 weeks ago. No evidence for upper GI bleeding at this point. BUN is normal.   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  --Black stool likely 2/2 to iron containing supplement but will start empiric PPI. If hgb declines will need EGD.   HPI   Patient was taken to [facility] ED today for evaluation of urinary retention.  Also reported bloody bowel movements.   Patient gives a history of chronic ( years) of intermittent painless rectal bleeding with BM. Gives a history of hemorrhoids. Bleeding gets worse with heavy lifting.   Reportedly had a colonoscopy with removal of 2 polyps in  [place]  about 10 years ago. No details available.  No know FMH of colon cancer  [name] describes black stools x 3 weeks. He started a supplement with iron 3 weeks ago. He has no abdominal pain. No N/V. He doesn't take NSAIDs.   Relevant workup thus far: WBC 12.2 , hemoglobin 13.1  (baseline 14 ), BUN 31 , creatinine 2.07, serum calcium  10.8 , FOBT positive.  UA positive nitrites, large leukocytes.   Labs and Imaging:  Recent Labs    01/01/24 0521 01/01/24 1448  WBC 12.2*  --   HGB 14.0 13.1  HCT 43.8 41.9  MCV 89.4  --   PLT 215  --    Recent Labs    01/01/24 0546  NA 143  K 3.6  CL 104  CO2 24  GLUCOSE 122*  BUN 31*  CREATININE 2.07*  CALCIUM  10.8*     VAS US  CAROTID Carotid Arterial Duplex Study  Patient Name:  Nicholas Mckenzie   Date of Exam:   02/12/2022 Medical Rec #: [id number]   Accession #:    [id number] Date of Birth: 08-Mar-1946  Patient Gender: M Patient Age:   27 years Exam Location:  [facility] Procedure:      VAS  US  CAROTID Referring Phys: PRAMOD SETHI  --------------------------------------------------------------------------------   Indications:       History of CVA on 04/01/21. Risk Factors:      Hypertension, hyperlipidemia, prior CVA. Other Factors:     Chronic Kidney disease, stage 3. Comparison Study:  No priors.  Performing Technologist: Ricka Holland RDMS, RVT    Examination Guidelines: A complete evaluation includes B-mode imaging, spectral Doppler, color Doppler, and power Doppler as needed of all accessible portions of each vessel. Bilateral testing is considered an integral part of a complete examination. Limited examinations for reoccurring indications may be performed as noted.    Right Carotid Findings: +----------+--------+--------+--------+------------------+------------------+           PSV cm/sEDV cm/sStenosisPlaque DescriptionComments            +----------+--------+--------+--------+------------------+------------------+ CCA Prox  106     21                                                   +----------+--------+--------+--------+------------------+------------------+ CCA Distal89      23                                intimal thickening +----------+--------+--------+--------+------------------+------------------+ ICA Prox  59      20      1-39%                     intimal thickening +----------+--------+--------+--------+------------------+------------------+ ICA Distal75      21                                                   +----------+--------+--------+--------+------------------+------------------+ ECA       91      16                                                   +----------+--------+--------+--------+------------------+------------------+  +----------+--------+-------+----------------+-------------------+           PSV cm/sEDV cmsDescribe        Arm Pressure (mmHG) +----------+--------+-------+----------------+-------------------+ [id number]            Multiphasic, WNL                    +----------+--------+-------+----------------+-------------------+  +---------+--------+--+--------+--------------+ VertebralPSV cm/s28EDV cm/sHigh resistant +---------+--------+--+--------+--------------+     Left Carotid Findings: +----------+--------+--------+--------+------------------+------------------+           PSV cm/sEDV cm/sStenosisPlaque DescriptionComments           +----------+--------+--------+--------+------------------+------------------+ CCA Prox  161     23                                tortuous           +----------+--------+--------+--------+------------------+------------------+ CCA Distal106     17                                                    +----------+--------+--------+--------+------------------+------------------+  ICA Prox  53      22      1-39%                     intimal thickening +----------+--------+--------+--------+------------------+------------------+ ICA Distal75      32                                                   +----------+--------+--------+--------+------------------+------------------+  +----------+--------+--------+----------------+-------------------+           PSV cm/sEDV cm/sDescribe        Arm Pressure (mmHG) +----------+--------+--------+----------------+-------------------+ Dlarojcpjw860             Multiphasic, WNL                    +----------+--------+--------+----------------+-------------------+  +---------+--------+--+--------+--+---------+ VertebralPSV cm/s56EDV cm/s24Antegrade +---------+--------+--+--------+--+---------+        Summary: Right Carotid: Velocities in the right ICA are consistent with a 1-39% stenosis.  Left Carotid: Velocities in the left ICA are consistent with a 1-39% stenosis.  Vertebrals:  Bilateral vertebral arteries demonstrate antegrade flow. Subclavians: Normal flow hemodynamics were seen in bilateral subclavian              arteries.  *See table(s) above for measurements and observations.    Electronically signed by Eather Popp MD on 02/13/2022 at 10:38:36 AM.      Final      Pertinent GI Studies         Past Medical History:  Diagnosis Date   Acute respiratory failure with hypoxia (HCC) 08/16/2021   Aspiration pneumonia (HCC) 08/15/2021   CKD (chronic kidney disease) stage 3, GFR 30-59 ml/min (HCC)    COVID-19 virus infection 06/06/2021   COVID-19 Labs     No results for input(s): DDIMER, FERRITIN, LDH, CRP in the last 72 hours.             Lab Results      Component    Value    Date           SARSCOV2NAA    POSITIVE (A)    06/06/2021           SARSCOV2NAA    NEGATIVE    04/12/2021            SARSCOV2NAA    NEGATIVE    04/01/2021           SARSCOV2NAA    NEGATIVE    03/11/2021           Hypertension    Kidney stones    Severe sepsis (HCC) 08/16/2021   Sleep apnea    Subdural hematoma (HCC) 06/06/2021    Past Surgical History:  Procedure Laterality Date   BLADDER STONE REMOVAL  12/2021   CATARACT EXTRACTION     IR NEPHROSTOMY EXCHANGE RIGHT  06/07/2021   IR NEPHROSTOMY EXCHANGE RIGHT  08/22/2021   IR NEPHROSTOMY PLACEMENT RIGHT  04/07/2021   IR PATIENT EVAL TECH 0-60 MINS  06/02/2021   kidney stent     MANDIBLE FRACTURE SURGERY     NEPHRECTOMY Right 01/07/2022    Family History  Problem Relation Age of Onset   Hypertension Mother    Hypertension Other     Prior to Admission medications   Medication Sig Start Date End Date Taking? Authorizing Provider  amLODipine  (NORVASC ) 10 MG tablet Take 10 mg  by mouth every evening.   Yes [provider]  GLUTATHIONE PO Take 1 Dose by mouth daily. Both OTC Glutathione drink and oral spray.   Yes [provider]  OVER THE COUNTER MEDICATION Take 1 capsule by mouth daily. OTC Emu Oil capsules   Yes [provider]  OVER THE COUNTER MEDICATION Take 1 Dose by mouth daily. Total-B tincture   Yes [provider]  OVER THE COUNTER MEDICATION Take 1 Capful by mouth daily. Kidney Complete liquid   Yes [provider]  OVER THE COUNTER MEDICATION Take 1 Bottle by mouth daily. 120 Life, liquid supplement   Yes [provider]  OVER THE COUNTER MEDICATION Take 1 tablet by mouth daily. OTC bone/muscle supplement   Yes [provider]    Current Facility-Administered Medications  Medication Dose Route Frequency Provider Last Rate Last Admin   acetaminophen  (TYLENOL ) tablet 650 mg  650 mg Oral Q6H PRN Tobie Mario GAILS, MD       Or   acetaminophen  (TYLENOL ) suppository 650 mg  650 mg Rectal Q6H PRN Tobie Mario GAILS, MD       amLODipine  (NORVASC ) tablet 10 mg  10 mg Oral QPM Tobie Mario GAILS, MD       cefTRIAXone  (ROCEPHIN ) 1 g in sodium chloride  0.9 % 100 mL IVPB  1 g Intravenous Q24H Reome, Earle J, RPH 200 mL/hr at 01/01/24 1504 1 g at 01/01/24 1504   fentaNYL  (SUBLIMAZE ) injection 50 mcg  50 mcg Intravenous Q1H PRN Jerrol Agent, MD   50 mcg at 01/01/24 9049   lactated ringers  infusion   Intravenous Continuous Tobie Mario GAILS, MD       ondansetron  (ZOFRAN ) tablet 4 mg  4 mg Oral Q6H PRN Tobie Mario GAILS, MD       Or   ondansetron  (ZOFRAN ) injection 4 mg  4 mg Intravenous Q6H PRN Patel, Ekta V, MD       Oral care mouth rinse  15 mL Mouth Rinse PRN Claudene Reeves A, MD       oxybutynin  (DITROPAN ) tablet 5 mg  5 mg Oral TID Jerrol Agent, MD   5 mg at 01/01/24 0747   pantoprazole  (PROTONIX ) injection 40 mg  40 mg Intravenous Q12H Palumbo, April, MD        Allergies as of 01/01/2024 - Review Complete 01/01/2024  Allergen Reaction Noted   Statins Diarrhea and Other (See Comments) 04/02/2021   Other Other (See Comments) 01/01/2024   Tape Rash 12/20/2021    Social History   Socioeconomic History   Marital status: Married    Spouse name: Not on file   Number of children: Not on file   Years of education: Not on file   Highest education level: Not on file  Occupational History   Not on file  Tobacco Use   Smoking status: Never   Smokeless tobacco: Never  Vaping Use   Vaping status: Never Used  Substance and Sexual Activity   Alcohol  use: Yes    Comment: Socially; twice a week   Drug use: Not on file   Sexual activity: Not on file  Other Topics Concern   Not on file  Social History Narrative   Not on file   Social Drivers of Health   Financial Resource Strain: Not on file  Food Insecurity: No Food Insecurity (01/01/2024)   Hunger Vital Sign    Worried About Running Out of Food in the Last Year: Never true  Ran Out of Food in the Last Year: Never true  Transportation Needs: No Transportation Needs (01/01/2024)   PRAPARE - Scientist, research (physical sciences) (Medical): No    Lack of Transportation (Non-Medical): No  Physical Activity: Not on file  Stress: Not on file  Social Connections: Socially Integrated (01/01/2024)   Social Connection and Isolation Panel    Frequency of Communication with Friends and Family: Three times a week    Frequency of Social Gatherings with Friends and Family: Once a week    Attends Religious Services: 1 to 4 times per year    Active Member of [employer] or Organizations: Yes    Attends Banker Meetings: Never    Marital Status: Married  Catering manager Violence: Not At Risk (01/01/2024)   Humiliation, Afraid, Rape, and Kick questionnaire    Fear of Current or Ex-Partner: No    Emotionally Abused: No    Physically Abused: No    Sexually Abused: No     Code Status   Code Status: Full Code  Review of Systems: All systems reviewed and negative except where noted in HPI.  Physical Exam: Vital signs in last 24 hours: Temp:  [98.2 F (36.8 C)-98.8 F (37.1 C)] 98.8 F (37.1 C) (07/09 1157) Pulse Rate:  [61-79] 64 (07/09 1157) Resp:  [11-22] 16 (07/09 1157) BP: (137-181)/(72-97) 181/97 (07/09 1157) SpO2:  [90 %-96 %] 96 % (07/09 1157) Weight:  [95 kg] 95 kg (07/09 1211) Last BM Date : 01/01/24  General:  Pleasant male in NAD Psych:  Cooperative. Normal mood and affect Eyes: Pupils equal Ears:  Normal auditory acuity Nose: No deformity, discharge or lesions Neck:  Supple, no masses felt Lungs:  Clear to auscultation.  Heart:  Regular rate, regular rhythm.  Abdomen:  Soft, nondistended, nontender, active bowel sounds, no masses felt Rectal :  No masses felt on DRE. Soft light brown stool in vault Msk: Symmetrical without gross deformities.  Neurologic:  Alert, oriented, grossly normal neurologically Extremities : No edema Skin:  Intact without significant lesions.    Intake/Output from previous day: No intake/output data recorded. Intake/Output this shift:  Total  I/O In: 500 [IV Piggyback:500] Out: -    Vina Dasen, NP-C   01/01/2024, 3:08 PM  I have taken an interval history, thoroughly reviewed the chart and examined the patient. I agree with the Advanced Practitioner's note, impression and recommendations, and have recorded additional findings, impressions and recommendations below. I performed a substantive portion of this encounter (>50% time spent), including a complete performance of the medical decision making.  My additional thoughts are as follows:  This patient was primarily seen in the ED and admitted for acute on chronic urinary symptoms and UTI.  In addition, his wife felt that the rectal bleeding that he has had for years was somewhat larger in volume yesterday.  Hemoglobin remains normal and this is most likely hemorrhoidal. Overnight observation reasonable, I am advancing to a regular diet.  Unless he demonstrates brisk GI bleeding with a significant drop in hemoglobin to suggest some source other than hemorrhoids, he is not likely need an inpatient colonoscopy.  His wife was asking if that could still be performed as inpatient even if not urgent since he has needed one for years, but if he remains stable and this still appears to be hemorrhoidal bleeding, our current consult and endoscopic volume will not accommodate that.   Victory LITTIE Brand III Office:[phone]

## 2024-01-01 NOTE — Plan of Care (Signed)
  Problem: Education: Goal: Knowledge of General Education information will improve Description: Including pain rating scale, medication(s)/side effects and non-pharmacologic comfort measures Outcome: Progressing   Problem: Coping: Goal: Level of anxiety will decrease Outcome: Progressing   Problem: Pain Managment: Goal: General experience of comfort will improve and/or be controlled Outcome: Progressing

## 2024-01-01 NOTE — ED Provider Notes (Signed)
 [facility] contacted me that though this patient was paged out for admission 552 am.  Awaiting call back.  No call from the hospitalist.  [facility] contacted me at [phone] and I was advised Dr. Alfornia would not be calling me back as she would not get to this patient.  Will need to be repaged in am    Earmon Sherrow, MD 01/01/24 [phone]

## 2024-01-01 NOTE — ED Provider Notes (Signed)
 Nicholas Mckenzie EMERGENCY DEPARTMENT AT [facility] Provider Note   CSN: [id number] Arrival date & time: 01/01/24  0451     Patient presents with: Rectal Bleeding and Urinary Incontinence   Nicholas Mckenzie is a 78 y.o. male.   The history is provided by the patient and the spouse.  Rectal Bleeding Quality:  Black and tarry and bright red Amount:  Copious Duration:  1 day Timing:  Constant Chronicity:  New Context: spontaneously   Similar prior episodes: no   Relieved by:  Nothing Worsened by:  Nothing Ineffective treatments:  None tried Associated symptoms: no fever and no loss of consciousness   Risk factors: no anticoagulant use   Paitent with CKD presents with rectal bleeding which started as melena and is now red and decreased UOP.      Past Medical History:  Diagnosis Date   CKD (chronic kidney disease) stage 3, GFR 30-59 ml/min (HCC)    Hypertension    Kidney stones    Sleep apnea      Prior to Admission medications   Medication Sig Start Date End Date Taking? Authorizing Provider  acetaminophen  (TYLENOL ) 325 MG tablet Take 2 tablets (650 mg total) by mouth every 6 (six) hours as needed for mild pain (or Fever >/= 101). 08/25/21   Sebastian Toribio GAILS, MD  amLODipine  (NORVASC ) 5 MG tablet Take 1 tablet (5 mg total) by mouth in the morning and at bedtime. Patient taking differently: Take 10 mg by mouth once. Pt reports taking once in AM 06/08/21   Hongalgi, Trenda BIRCH, MD  aspirin  EC 81 MG tablet Take 1 tablet (81 mg total) by mouth daily. Swallow whole. 01/31/22   Rosemarie Eather RAMAN, MD  B Complex-C CAPS Take 1 capsule by mouth daily.    [provider]  Evolocumab  (REPATHA  SURECLICK) 140 MG/ML SOAJ Inject 140 mg into the skin every 14 (fourteen) days. 02/04/22   Rosemarie Eather RAMAN, MD  FLUoxetine  (PROZAC ) 40 MG capsule Take 40 mg by mouth daily.    [provider]  lactobacillus (FLORANEX/LACTINEX) PACK Take 1 packet (1 g total) by mouth 2 (two) times daily.  04/13/21   Christobal Guadalajara, MD  melatonin 3 MG TABS tablet Take 3 mg by mouth at bedtime.    [provider]  Multiple Vitamins-Minerals (MULTIVITAMIN WITH MINERALS) tablet Take 1 tablet by mouth daily.    [provider]  Omega-3 Fatty Acids (FISH OIL PO) Take 1 capsule by mouth daily.    [provider]    Allergies: Statins and Tape    Review of Systems  Constitutional:  Negative for fever.  Respiratory:  Negative for shortness of breath, wheezing and stridor.   Cardiovascular:  Negative for chest pain.  Gastrointestinal:  Positive for anal bleeding, blood in stool and hematochezia.  Genitourinary:  Positive for difficulty urinating.  Neurological:  Negative for loss of consciousness.  All other systems reviewed and are negative.   Updated Vital Signs BP 137/76 (BP Location: Right Arm)   Pulse 79   Temp 98.2 F (36.8 C) (Oral)   Resp (!) 22   SpO2 93%   Physical Exam Vitals and nursing note reviewed. Exam conducted with a chaperone present.  Constitutional:      General: He is not in acute distress.    Appearance: He is well-developed. He is not diaphoretic.  HENT:     Head: Normocephalic and atraumatic.     Nose: Nose normal.  Eyes:     Conjunctiva/sclera:  Conjunctivae normal.     Pupils: Pupils are equal, round, and reactive to light.  Cardiovascular:     Rate and Rhythm: Normal rate and regular rhythm.     Pulses: Normal pulses.     Heart sounds: Normal heart sounds.  Pulmonary:     Effort: Pulmonary effort is normal.     Breath sounds: Normal breath sounds. No wheezing or rales.  Abdominal:     General: Bowel sounds are normal.     Palpations: Abdomen is soft.     Tenderness: There is no abdominal tenderness. There is no guarding or rebound.  Genitourinary:    Comments: Blood on bottom  Musculoskeletal:        General: Normal range of motion.     Cervical back: Normal range of motion and neck supple.  Skin:    General: Skin is warm  and dry.     Capillary Refill: Capillary refill takes less than 2 seconds.  Neurological:     General: No focal deficit present.     Mental Status: He is alert and oriented to person, place, and time.     Deep Tendon Reflexes: Reflexes normal.  Psychiatric:        Mood and Affect: Mood normal.        Behavior: Behavior normal.     (all labs ordered are listed, but only abnormal results are displayed) Results for orders placed or performed during the hospital encounter of 01/01/24  CBC   Collection Time: 01/01/24  5:21 AM  Result Value Ref Range   WBC 12.2 (H) 4.0 - 10.5 K/uL   RBC 4.90 4.22 - 5.81 MIL/uL   Hemoglobin 14.0 13.0 - 17.0 g/dL   HCT 56.1 60.9 - 47.9 %   MCV 89.4 80.0 - 100.0 fL   MCH 28.6 26.0 - 34.0 pg   MCHC 32.0 30.0 - 36.0 g/dL   RDW 85.7 88.4 - 84.4 %   Platelets 215 150 - 400 K/uL   nRBC 0.0 0.0 - 0.2 %  Occult blood card to lab, stool Provider will collect   Collection Time: 01/01/24  5:41 AM  Result Value Ref Range   Fecal Occult Bld POSITIVE (A) NEGATIVE  Basic metabolic panel with GFR   Collection Time: 01/01/24  5:46 AM  Result Value Ref Range   Sodium 143 135 - 145 mmol/L   Potassium 3.6 3.5 - 5.1 mmol/L   Chloride 104 98 - 111 mmol/L   CO2 24 22 - 32 mmol/L   Glucose, Bld 122 (H) 70 - 99 mg/dL   BUN 31 (H) 8 - 23 mg/dL   Creatinine, Ser 7.92 (H) 0.61 - 1.24 mg/dL   Calcium  10.8 (H) 8.9 - 10.3 mg/dL   GFR, Estimated 33 (L) >60 mL/min   Anion gap 15 5 - 15   No results found.    Procedures   Medications Ordered in the ED  pantoprazole  (PROTONIX ) injection 40 mg (has no administration in time range)    Followed by  pantoprazole  (PROTONIX ) injection 40 mg (has no administration in time range)                                    Medical Decision Making Patient with black stool and then red rectal bleeding this evening   Amount and/or Complexity of Data Reviewed Independent Historian: spouse    Details: See above  External Data  Reviewed:  notes.    Details: Previous notes reviewed  Labs: ordered.    Details: Hemoccult positive.  White count slight elevation 12.2, normal hemoglobin 14, normal platelets.  Normal sodium 143, normal potassium elevated creatinine 2 Discussion of management or test interpretation with external provider(s): Dr. Burnette of GI secure chat placed.    Risk Prescription drug management. Decision regarding hospitalization.    Final diagnoses:  Rectal bleeding   The patient appears reasonably stabilized for admission considering the current resources, flow, and capabilities available in the ED at this time, and I doubt any other [facility] requiring further screening and/or treatment in the ED prior to admission.  ED Discharge Orders     None          Domonick Sittner, MD 01/01/24 [phone]

## 2024-01-02 DIAGNOSIS — K625 Hemorrhage of anus and rectum: Secondary | ICD-10-CM | POA: Diagnosis not present

## 2024-01-02 DIAGNOSIS — K922 Gastrointestinal hemorrhage, unspecified: Secondary | ICD-10-CM | POA: Diagnosis not present

## 2024-01-02 LAB — BASIC METABOLIC PANEL WITH GFR
Anion gap: 11 (ref 5–15)
BUN: 16 mg/dL (ref 8–23)
CO2: 26 mmol/L (ref 22–32)
Calcium: 9 mg/dL (ref 8.9–10.3)
Chloride: 104 mmol/L (ref 98–111)
Creatinine, Ser: 1.44 mg/dL — ABNORMAL HIGH (ref 0.61–1.24)
GFR, Estimated: 50 mL/min — ABNORMAL LOW (ref >60)
Glucose, Bld: 105 mg/dL — ABNORMAL HIGH (ref 70–99)
Potassium: 3 mmol/L — ABNORMAL LOW (ref 3.5–5.1)
Sodium: 141 mmol/L (ref 135–145)

## 2024-01-02 LAB — CBC
HCT: 41 % (ref 39.0–52.0)
Hemoglobin: 13 g/dL (ref 13.0–17.0)
MCH: 28 pg (ref 26.0–34.0)
MCHC: 31.7 g/dL (ref 30.0–36.0)
MCV: 88.2 fL (ref 80.0–100.0)
Platelets: 235 K/uL (ref 150–400)
RBC: 4.65 MIL/uL (ref 4.22–5.81)
RDW: 14 % (ref 11.5–15.5)
WBC: 9 K/uL (ref 4.0–10.5)
nRBC: 0 % (ref 0.0–0.2)

## 2024-01-02 LAB — HEMOGLOBIN A1C
Hgb A1c MFr Bld: 5.8 % — ABNORMAL HIGH (ref 4.8–5.6)
Mean Plasma Glucose: 120 mg/dL

## 2024-01-02 MED ORDER — SODIUM CHLORIDE 0.9 % IV SOLN
3.0000 g | Freq: Four times a day (QID) | INTRAVENOUS | Status: DC
  Administered 2024-01-02 – 2024-01-03 (×4): 3 g via INTRAVENOUS
  Filled 2024-01-02 (×4): qty 8

## 2024-01-02 MED ORDER — SACCHAROMYCES BOULARDII 250 MG PO CAPS
250.0000 mg | ORAL_CAPSULE | Freq: Two times a day (BID) | ORAL | Status: DC
  Administered 2024-01-02 – 2024-01-03 (×3): 250 mg via ORAL
  Filled 2024-01-02 (×3): qty 1

## 2024-01-02 MED ORDER — POTASSIUM CHLORIDE CRYS ER 20 MEQ PO TBCR
60.0000 meq | EXTENDED_RELEASE_TABLET | Freq: Once | ORAL | Status: AC
  Administered 2024-01-02: 60 meq via ORAL
  Filled 2024-01-02: qty 3

## 2024-01-02 NOTE — Progress Notes (Signed)
 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  On presentation, he was hemodynamically stable.  SABRA  UA was suspicious for UTI.  FOBT positive.  GI consulted.  Started on ceftriaxone  for possible UTI  Assessment & Plan:  Principal Problem:   GI bleed Active Problems:   Transfusion of blood product refused for religious reason   UTI (urinary tract infection)   H/O ischemic right PCA stroke   Ascending aortic aneurysm (HCC)   Obesity (BMI 30-39.9)   OSA (obstructive sleep apnea)   Essential (primary) hypertension   Acute kidney injury superimposed on stage 3b chronic kidney disease (HCC)  Suspected upper GI bleed: Does not take any blood thinners at home.  Had bright red blood per rectum in commode.  Started on IV Protonix .  Patient is a Scientist, product/process development.  Hemoglobin is currently stable.  GI consulted.  Suspected hemorrhoidal bleed.  Suspected UTI: Urine culture sent.  Started on ceftriaxone .  UA suspicious for UTI.  Complained of dysuria  Bladder spasms: Currently kidney function stable.  Bladder scan did not show any residual urine.  Complains of bladder spasms, pain.  He has history of urethral stricture he has recurrent urinary tract infection, incontinence, solitary kidney.  Status post dilatation of the urethra, status post right ureteral stent placement for right ureteral obstruction.  He follows with urology On 6/23 he had a right nephrectomy, partial ureterectomy, removal of ureteral stones.  He has chronic urinary incontinence and uses several pads a day.  Had dilation of urethral stricture  on 12/12. We recommend follow-up with urology as an outpatient.  Urology recommended external catheter use.  History of ischemic right PCA stroke: Not on aspirin  at home.  Exam is nonfocal.  MRI done on 2/23 showed chronic infarct on the right occipital lobe.  As per wife he has weakness on the left side  Obesity: BMI of 31.8  OSA: Continue CPAP  Hypertension: Currently blood pressure stable.  Continue current medication  AKI on CKD stage IIIb: Baseline creatinine around 1.6.  Presented with creatinine of 2.  Currently on gentle IV fluid           DVT prophylaxis:SCDs Start: 01/01/24 1249     Code Status: Full Code  Family Communication: Discussed with wife at bedside on 7/10  Patient status:Obs  Patient is from :home  Anticipated discharge un:ynfz  Estimated DC date: Tomorrow   Consultants: GI  Procedures: None yet  Antimicrobials:  Anti-infectives (From admission, onward)    Start     Dose/Rate Route Frequency Ordered Stop   01/01/24 1415  cefTRIAXone  (ROCEPHIN ) 2 g in sodium chloride  0.9 % 100 mL IVPB  Status:  Discontinued        2 g 200 mL/hr over 30 Minutes Intravenous Every 24 hours 01/01/24 1323 01/01/24 1326   01/01/24 1415  cefTRIAXone  (ROCEPHIN ) 1 g in sodium chloride  0.9 % 100 mL IVPB        1 g 200 mL/hr over 30 Minutes Intravenous Every 24 hours 01/01/24 1326 01/06/24 1359       Subjective: Patient seen and examined at the bedside today.  Hemodynamically stable.  Lying on bed.  Afebrile.  No episode of rectal bleeding after admission.  Continues to have urinary symptoms.  He complains of bladder spasms.  He follows with urology.  Urine culture is pending  Objective: Vitals:   01/01/24 1542 01/01/24 2045 01/02/24 0425 01/02/24 0536  BP: (!) 167/80 134/64 (!) 171/85 (!) 107/93  Pulse: 68 66 64 (!) 56  Resp: 16 16 16    Temp: 97.6 F (36.4 C) 98.5 F (36.9 C) 98.7 F (37.1 C)   TempSrc: Oral Oral Oral   SpO2: 96% 94% 95%   Weight:       Height:        Intake/Output Summary (Last 24 hours) at 01/02/2024 0829 Last data filed at 01/02/2024 0600 Gross per 24 hour  Intake 970.28 ml  Output 1100 ml  Net -129.72 ml   Filed Weights   01/01/24 1211  Weight: 95 kg    Examination:  General exam: Overall comfortable, not in distress HEENT: PERRL Respiratory system:  no wheezes or crackles  Cardiovascular system: S1 & S2 heard, RRR.  Gastrointestinal system: Abdomen is nondistended, soft and nontender. Central nervous system: Alert and oriented Extremities: No edema, no clubbing ,no cyanosis Skin: No rashes, no ulcers,no icterus     Data Reviewed: I have personally reviewed following labs and imaging studies  CBC: Recent Labs  Lab 01/01/24 0521 01/01/24 1448 01/01/24 1743 01/01/24 2054 01/02/24 0655  WBC 12.2*  --   --   --  9.0  HGB 14.0 13.1 12.8* 12.6* 13.0  HCT 43.8 41.9 41.0 39.9 41.0  MCV 89.4  --   --   --  88.2  PLT 215  --   --   --  235   Basic Metabolic Panel: Recent Labs  Lab 01/01/24 0546  NA 143  K 3.6  CL 104  CO2 24  GLUCOSE 122*  BUN 31*  CREATININE 2.07*  CALCIUM  10.8*     No results found for this or any previous visit (from the past 240 hours).   Radiology Studies: DG Chest 1 View Result Date: 01/01/2024 CLINICAL DATA:  Cough. EXAM: CHEST  1 VIEW COMPARISON:  08/22/2021. FINDINGS: The heart is enlarged the mediastinal contour stable. Lung volumes are low. No consolidation, effusion, or pneumothorax is seen. No acute osseous abnormality. IMPRESSION: No active disease. Electronically Signed   By: Leita Birmingham M.D.   On: 01/01/2024 20:10    Scheduled Meds:  amLODipine   10 mg Oral QPM   oxybutynin   5 mg Oral TID   pantoprazole  (PROTONIX ) IV  40 mg Intravenous Q12H   Continuous Infusions:  sodium chloride  50 mL/hr at 01/01/24 2022   cefTRIAXone  (ROCEPHIN )  IV Stopped (01/01/24 1537)   lactated ringers  Stopped (01/01/24 2000)     LOS: 0 days   Ivonne Mustache, MD [facility] P7/03/2024, 8:29 AM

## 2024-01-02 NOTE — Progress Notes (Addendum)
 Daily Progress Note  DOA: 01/01/2024 Hospital Day: 2   Cc:  rectal bleeding  Brief History:  78 y.o. year old male with a medical history including but not limited to CKD3b, CVA , hypertension , anemia of chronic disease, recurrent UTIs admitted with bladder pain. Admitted with bladder pain  ASSESSMENT    Chronic ( years), intermittent painless rectal bleeding with bowel movements. Likely 2/2 to internal hemorrhoids.  No obvious rectal mass on DRE and had light brown stool in vault.  Hgb is normal at 13.    Reported black stool x 3 week.  He started a supplement with iron about 3 weeks ago. No evidence for upper GI bleeding at this point. BUN is normal.    History of bladder stones, stricture of urethra s/p dilation and hx of nephroureterectomy  11/2021   Jehovah's Witness  Principal Problem:   GI bleed Active Problems:   H/O ischemic right PCA stroke   UTI (urinary tract infection)   Ascending aortic aneurysm (HCC)   Obesity (BMI 30-39.9)   OSA (obstructive sleep apnea)   Transfusion of blood product refused for religious reason   Essential (primary) hypertension   Acute kidney injury superimposed on stage 3b chronic kidney disease (HCC)   PLAN   --Will arrange for office follow up to get scheduled for colonoscopy.  --Recommended he stop the iron containing supplement that he recently started. This can help sort out if the supplement was the cause vrs GI bleed ( less likely) . We can address if any need for EGD when we see him in the office.    Subjective   No BMs / or rectal bleeding today   Objective      Recent Labs    01/01/24 0521 01/01/24 1448 01/01/24 1743 01/01/24 2054 01/02/24 0655  WBC 12.2*  --   --   --  9.0  HGB 14.0   < > 12.8* 12.6* 13.0  HCT 43.8   < > 41.0 39.9 41.0  MCV 89.4  --   --   --  88.2  PLT 215  --   --   --  235   < > = values in this interval not displayed.   Recent Labs    01/01/24 0546 01/02/24 0655  NA 143 141   K 3.6 3.0*  CL 104 104  CO2 24 26  GLUCOSE 122* 105*  BUN 31* 16  CREATININE 2.07* 1.44*  CALCIUM  10.8* 9.0   Recent Labs    01/01/24 1448  PROT 6.5  ALBUMIN 3.5  AST 14*  ALT 14  ALKPHOS 46  BILITOT 0.8  BILIDIR <0.1  IBILI NOT CALCULATED     Scheduled inpatient medications:   amLODipine   10 mg Oral QPM   oxybutynin   5 mg Oral TID   pantoprazole  (PROTONIX ) IV  40 mg Intravenous Q12H   Continuous inpatient infusions:   sodium chloride  75 mL/hr at 01/02/24 0935   cefTRIAXone  (ROCEPHIN )  IV Stopped (01/01/24 1537)   PRN inpatient medications: acetaminophen  **OR** acetaminophen , hydrALAZINE , ondansetron  **OR** ondansetron  (ZOFRAN ) IV, mouth rinse  Vital signs in last 24 hours: Temp:  [97.6 F (36.4 C)-98.8 F (37.1 C)] 98.2 F (36.8 C) (07/10 0930) Pulse Rate:  [56-73] 73 (07/10 0930) Resp:  [16-17] 17 (07/10 0930) BP: (107-181)/(64-97) 138/68 (07/10 0930) SpO2:  [94 %-96 %] 94 % (07/10 0930) Weight:  [95 kg] 95 kg (07/09 1211) Last BM Date : 01/01/24  Intake/Output Summary (Last 24  hours) at 01/02/2024 1155 Last data filed at 01/02/2024 1000 Gross per 24 hour  Intake 1210.28 ml  Output 1100 ml  Net 110.28 ml    Intake/Output from previous day: 07/09 0701 - 07/10 0700 In: 1470.3 [P.O.:240; I.V.:379.3; IV Piggyback:851] Out: 1100 [Urine:1100] Intake/Output this shift: Total I/O In: 240 [P.O.:240] Out: -    Physical Exam:  General: Alert male in NAD. Wife in room Heart:  Regular rate and rhythm.  Pulmonary: Normal respiratory effort Abdomen: Soft, nondistended, nontender. Normal bowel sounds. Extremities: No lower extremity edema  Neurologic: Alert and oriented Psych: Pleasant. Cooperative     LOS: 0 days   Vina Dasen ,NP 01/02/2024, 11:55 AM  I have taken an interval history, thoroughly reviewed the chart and examined the patient. I agree with the Advanced Practitioner's note, impression and recommendations, and have recorded additional  findings, impressions and recommendations below. I performed a substantive portion of this encounter (>50% time spent), including a complete performance of the medical decision making.  My additional thoughts are as follows:  Longstanding rectal bleeding this sounds likely hemorrhoidal that was occurring when he was admitted for UTI yesterday.  No blood today, hemoglobin stable  No inpatient colonoscopy.  I left them my card so he can see me in clinic and we can make those plans. Inpatient GI service signing off-call as needed   Victory LITTIE Brand III [phone]

## 2024-01-02 NOTE — Care Management Obs Status (Signed)
 MEDICARE OBSERVATION STATUS NOTIFICATION   Patient Details  Name: Nicholas Mckenzie MRN: [id number] Date of Birth: 11/10/1945   Medicare Observation Status Notification Given:  Yes    Jon Cruel 01/02/2024, 10:41 AM

## 2024-01-03 ENCOUNTER — Other Ambulatory Visit (HOSPITAL_COMMUNITY): Payer: Self-pay

## 2024-01-03 DIAGNOSIS — K922 Gastrointestinal hemorrhage, unspecified: Secondary | ICD-10-CM | POA: Diagnosis not present

## 2024-01-03 LAB — CBC
HCT: 41.9 % (ref 39.0–52.0)
Hemoglobin: 13.5 g/dL (ref 13.0–17.0)
MCH: 28.4 pg (ref 26.0–34.0)
MCHC: 32.2 g/dL (ref 30.0–36.0)
MCV: 88 fL (ref 80.0–100.0)
Platelets: 223 K/uL (ref 150–400)
RBC: 4.76 MIL/uL (ref 4.22–5.81)
RDW: 13.9 % (ref 11.5–15.5)
WBC: 7.7 K/uL (ref 4.0–10.5)
nRBC: 0 % (ref 0.0–0.2)

## 2024-01-03 LAB — BASIC METABOLIC PANEL WITH GFR
Anion gap: 10 (ref 5–15)
BUN: 16 mg/dL (ref 8–23)
CO2: 24 mmol/L (ref 22–32)
Calcium: 9 mg/dL (ref 8.9–10.3)
Chloride: 108 mmol/L (ref 98–111)
Creatinine, Ser: 1.35 mg/dL — ABNORMAL HIGH (ref 0.61–1.24)
GFR, Estimated: 54 mL/min — ABNORMAL LOW (ref >60)
Glucose, Bld: 109 mg/dL — ABNORMAL HIGH (ref 70–99)
Potassium: 3.4 mmol/L — ABNORMAL LOW (ref 3.5–5.1)
Sodium: 142 mmol/L (ref 135–145)

## 2024-01-03 MED ORDER — OXYBUTYNIN CHLORIDE 5 MG PO TABS
5.0000 mg | ORAL_TABLET | Freq: Three times a day (TID) | ORAL | 0 refills | Status: AC | PRN
  Filled 2024-01-03: qty 30, 10d supply, fill #0

## 2024-01-03 MED ORDER — POTASSIUM CHLORIDE CRYS ER 20 MEQ PO TBCR
40.0000 meq | EXTENDED_RELEASE_TABLET | Freq: Once | ORAL | Status: AC
  Administered 2024-01-03: 40 meq via ORAL
  Filled 2024-01-03: qty 2

## 2024-01-03 MED ORDER — SACCHAROMYCES BOULARDII 250 MG PO CAPS
250.0000 mg | ORAL_CAPSULE | Freq: Two times a day (BID) | ORAL | 0 refills | Status: AC
  Filled 2024-01-03: qty 60, 30d supply, fill #0

## 2024-01-03 MED ORDER — FOSFOMYCIN TROMETHAMINE 3 G PO PACK
3.0000 g | PACK | Freq: Once | ORAL | Status: AC
  Administered 2024-01-03: 3 g via ORAL
  Filled 2024-01-03: qty 3

## 2024-01-03 MED ORDER — PANTOPRAZOLE SODIUM 40 MG PO TBEC: 40.0000 mg | DELAYED_RELEASE_TABLET | Freq: Two times a day (BID) | ORAL | Status: DC

## 2024-01-03 NOTE — Discharge Summary (Signed)
 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  On presentation, he was hemodynamically stable.  SABRA  UA was suspicious for UTI.  FOBT positive.  GI consulted.  Started on ceftriaxone  for possible UTI .  Urine cultures for Enterococcus faecalis: Treated with Unasyn  and also a dose of fosfomycin.  GI recommended outpatient follow-up for the suspicion of hemorrhoidal bleeding.  Currently hemoglobin stable.  Medically stable for discharge home today.  Following problems were addressed during the hospitalization:  Suspected upper GI bleed: Does not take any blood thinners at home.  Had bright red blood per rectum in commode.  Hemoglobin is currently stable.  GI consulted.  Suspected hemorrhoidal bleed.  Outpatient follow-up recommended.  No further episodes of hematochezia   Suspected UTI:  UA suspicious for UTI.  Urine culture showing 30,000 colonies of Enterococcus faecalis based he was on Unasyn .  Will give him 1 dose of fosfomycin.   Bladder spasms: Currently kidney function stable.  Bladder scan did not show any residual urine.  Complains of bladder spasms, pain.  He has history of urethral stricture he has recurrent urinary tract infection, incontinence, solitary kidney.  Status post dilatation of the urethra, status post right ureteral stent placement for right ureteral obstruction.  He follows with urology On 6/23 he had  a right nephrectomy, partial ureterectomy, removal of ureteral stones.  He has chronic urinary incontinence and uses several pads a day.  Had dilation of urethral stricture on 12/12. We recommend follow-up with urology as an outpatient.  Urology recommended external catheter use, Florastor.  Started on oxybutynin  here which has helped him with bladder spasms.   History of ischemic right PCA stroke: Not on aspirin  at home.  Exam is nonfocal.  MRI done on 2/23 showed chronic infarct on the right occipital lobe.  As per wife he has weakness on the left side   Obesity: BMI of 31.8   OSA: Continue CPAP   Hypertension: Currently blood pressure stable.  Continue home medications  AKI on CKD stage IIIb: Baseline creatinine around 1.6.  Presented with creatinine of 2.  Kidney function better than baseline now.   Discharge Diagnoses:  Principal Problem:   GI bleed Active Problems:   Transfusion of blood product refused for religious reason   UTI (urinary tract infection)   H/O ischemic right PCA stroke   Ascending aortic aneurysm (HCC)   Obesity (BMI 30-39.9)   OSA (obstructive sleep apnea)   Essential (primary) hypertension   Acute kidney injury superimposed on stage 3b chronic kidney disease (HCC)   Rectal bleeding    Discharge Instructions  Discharge Instructions     Diet general   Complete by: As directed    Discharge instructions   Complete by: As directed    1)Please take your medications as instructed 2)Follow up with your urologist as an outpatient in a week 3)Follow up with gastroenterology as an outpatient   Increase activity slowly   Complete by: As directed       Allergies as of 01/03/2024  Reactions   Statins Diarrhea, Other (See Comments)   Myalgias Myopathy   Other Other (See Comments)   No blood products - religious observance (Jehovah's Witness)   Tape Rash        Medication List     TAKE these medications    amLODipine  10 MG tablet Commonly  known as: NORVASC  Take 10 mg by mouth every evening.   GLUTATHIONE PO Take 1 Dose by mouth daily. Both OTC Glutathione drink and oral spray.   OVER THE COUNTER MEDICATION Take 1 capsule by mouth daily. OTC Emu Oil capsules   OVER THE COUNTER MEDICATION Take 1 Dose by mouth daily. Total-B tincture   OVER THE COUNTER MEDICATION Take 1 Capful by mouth daily. Kidney Complete liquid   OVER THE COUNTER MEDICATION Take 1 Bottle by mouth daily. 120 Life, liquid supplement   OVER THE COUNTER MEDICATION Take 1 tablet by mouth daily. OTC bone/muscle supplement   oxybutynin  5 MG tablet Commonly known as: DITROPAN  Take 1 tablet (5 mg total) by mouth every 8 (eight) hours as needed for bladder spasms.   saccharomyces boulardii 250 MG capsule Commonly known as: FLORASTOR Take 1 capsule (250 mg total) by mouth 2 (two) times daily.        Follow-up Information     Pahwani, Velna SAUNDERS, MD. Schedule an appointment as soon as possible for a visit in 1 week(s).   Specialty: Internal Medicine Contact information: [address] [place] [address] [phone]         [facility] Follow up.   Specialty: Home Health Services Contact information: [address] [place] [address] [phone]                Allergies  Allergen Reactions   Statins Diarrhea and Other (See Comments)    Myalgias Myopathy   Other Other (See Comments)    No blood products - religious observance (Jehovah's Witness)   Tape Rash    Consultations: GI   Procedures/Studies: DG Chest 1 View Result Date: 01/01/2024 CLINICAL DATA:  Cough. EXAM: CHEST  1 VIEW COMPARISON:  08/22/2021. FINDINGS: The heart is enlarged the mediastinal contour stable. Lung volumes are low. No consolidation, effusion, or pneumothorax is seen. No acute osseous abnormality. IMPRESSION: No active disease. Electronically Signed   By: Leita Birmingham M.D.   On: 01/01/2024 20:10       Subjective: Patient seen and examined at bedside today.  He feels much better today.  Bladder spasm pains are much better today.  Further episodes of hematochezia.  He feels ready to go home.  He was seen by physical therapy before discharge.  I discussed the discharge planning with wife at bedside.  Discharge Exam: Vitals:   01/03/24 0525 01/03/24 0742  BP: (!) 147/80 (!) 152/84  Pulse: 62 73  Resp: 16 16  Temp: 98 F (36.7 C) 98.2 F (36.8 C)  SpO2: 96% 94%   Vitals:   01/02/24 2012 01/02/24 2104 01/03/24 0525 01/03/24 0742  BP: (!) 171/78 (!) 167/83 (!) 147/80 (!) 152/84  Pulse: 68  62 73  Resp: 16  16 16   Temp: 98.7 F (37.1 C)  98 F (36.7 C) 98.2 F (36.8 C)  TempSrc: Oral     SpO2: 92%  96% 94%  Weight:      Height:        General: Pt is alert, awake, not in acute distress Cardiovascular: RRR, S1/S2 +, no rubs, no  gallops Respiratory: CTA bilaterally, no wheezing, no rhonchi Abdominal: Soft, NT, ND, bowel sounds + Extremities: no edema, no cyanosis    The results of significant diagnostics from this hospitalization (including imaging, microbiology, ancillary and laboratory) are listed below for reference.     Microbiology: Recent Results (from the past 240 hours)  Urine Culture (for pregnant, neutropenic or urologic patients or patients with an indwelling urinary catheter)     Status: Abnormal (Preliminary result)   Collection Time: 01/01/24  1:12 PM   Specimen: Urine, Catheterized  Result Value Ref Range Status   Specimen Description URINE, CATHETERIZED  Final   Special Requests NONE  Final   Culture (A)  Final    30,000 COLONIES/mL ENTEROCOCCUS FAECALIS CULTURE REINCUBATED FOR BETTER GROWTH SUSCEPTIBILITIES TO FOLLOW Performed at [facility], [address] [place], [place] [address]    Report Status PENDING  Incomplete     Labs: BNP (last 3 results) No results for input(s): BNP in the last 8760 hours. Basic Metabolic  Panel: Recent Labs  Lab 01/01/24 0546 01/02/24 0655 01/03/24 0705  NA 143 141 142  K 3.6 3.0* 3.4*  CL 104 104 108  CO2 24 26 24   GLUCOSE 122* 105* 109*  BUN 31* 16 16  CREATININE 2.07* 1.44* 1.35*  CALCIUM  10.8* 9.0 9.0   Liver Function Tests: Recent Labs  Lab 01/01/24 1448  AST 14*  ALT 14  ALKPHOS 46  BILITOT 0.8  PROT 6.5  ALBUMIN 3.5   No results for input(s): LIPASE, AMYLASE in the last 168 hours. No results for input(s): AMMONIA in the last 168 hours. CBC: Recent Labs  Lab 01/01/24 0521 01/01/24 1448 01/01/24 1743 01/01/24 2054 01/02/24 0655 01/03/24 0705  WBC 12.2*  --   --   --  9.0 7.7  HGB 14.0 13.1 12.8* 12.6* 13.0 13.5  HCT 43.8 41.9 41.0 39.9 41.0 41.9  MCV 89.4  --   --   --  88.2 88.0  PLT 215  --   --   --  235 223   Cardiac Enzymes: No results for input(s): CKTOTAL, CKMB, CKMBINDEX, TROPONINI in the last 168 hours. BNP: Invalid input(s): POCBNP CBG: No results for input(s): GLUCAP in the last 168 hours. D-Dimer No results for input(s): DDIMER in the last 72 hours. Hgb A1c Recent Labs    01/01/24 2054  HGBA1C 5.8*   Lipid Profile No results for input(s): CHOL, HDL, LDLCALC, TRIG, CHOLHDL, LDLDIRECT in the last 72 hours. Thyroid  function studies No results for input(s): TSH, T4TOTAL, T3FREE, THYROIDAB in the last 72 hours.  Invalid input(s): FREET3 Anemia work up No results for input(s): VITAMINB12, FOLATE, FERRITIN, TIBC, IRON, RETICCTPCT in the last 72 hours. Urinalysis    Component Value Date/Time   COLORURINE YELLOW 01/01/2024 0800   APPEARANCEUR HAZY (A) 01/01/2024 0800   LABSPEC 1.012 01/01/2024 0800   PHURINE 7.0 01/01/2024 0800   GLUCOSEU NEGATIVE 01/01/2024 0800   HGBUR TRACE (A) 01/01/2024 0800   BILIRUBINUR NEGATIVE 01/01/2024 0800   KETONESUR TRACE (A) 01/01/2024 0800   PROTEINUR 30 (A) 01/01/2024 0800   NITRITE POSITIVE (A) 01/01/2024 0800   LEUKOCYTESUR  LARGE (A) 01/01/2024 0800   Sepsis Labs Recent Labs  Lab 01/01/24 0521 01/02/24 0655 01/03/24 0705  WBC 12.2* 9.0 7.7   Microbiology Recent Results (from the past 240 hours)  Urine Culture (for pregnant, neutropenic or urologic patients or patients with an indwelling urinary catheter)     Status: Abnormal (Preliminary result)  Collection Time: 01/01/24  1:12 PM   Specimen: Urine, Catheterized  Result Value Ref Range Status   Specimen Description URINE, CATHETERIZED  Final   Special Requests NONE  Final   Culture (A)  Final    30,000 COLONIES/mL ENTEROCOCCUS FAECALIS CULTURE REINCUBATED FOR BETTER GROWTH SUSCEPTIBILITIES TO FOLLOW Performed at [facility], [address] [place], [place] [address]    Report Status PENDING  Incomplete    Please note: You were cared for by a hospitalist during your hospital stay. Once you are discharged, your primary care physician will handle any further medical issues. Please note that NO REFILLS for any discharge medications will be authorized once you are discharged, as it is imperative that you return to your primary care physician (or establish a relationship with a primary care physician if you do not have one) for your post hospital discharge needs so that they can reassess your need for medications and monitor your lab values.    Time coordinating discharge: 40 minutes  SIGNED:   Ivonne Mustache, MD  [facility] 01/03/2024, 1:44 PM Pager [phone]  If 7PM-7AM, please contact night-coverage [email or web address] Password [email or web address]

## 2024-01-03 NOTE — Evaluation (Addendum)
 Physical Therapy Evaluation Patient Details Name: Nicholas Mckenzie MRN: [id number] DOB: September 21, 1945 Today's Date: 01/03/2024  Clinical Impression  Prior to admittance pt was requiring physical assistance for ADLs and transfers, mobilizing short distances w/ rollator. Pt presents to evaluation with deficits in mobility, strength, power, balance, and activity tolerance, all limiting pt's ability to mobilize near baseline. Pt was able to ambulate w/ AD and no physical assistance given. Pt had one instance of R lateral and posterior loss of balance while sitting edge of bed requiring physical assistance for obtaining upright. PT will continue to treat pt while he is admitted. Recommending HHPT at discharge to address remaining mobility deficits and optimize return to PLOF.        01/03/24 1300  PT Visit Information  Last PT Received On 01/03/24  Assistance Needed +1  History of Present Illness PT is a 78 y.o. M presenting to [facility] on 01/01/24 w/ bladder pain and urinary retention. Pt admitted w/ GI bleed, and UTI. PMH includes CKD stage IIIa, HTN, OSA, depression/anxiety, CVA with residual left hemiparesis.  Precautions  Precautions Fall  Recall of Precautions/Restrictions Intact  Restrictions  Weight Bearing Restrictions Per Provider Order No  Home Living  Family/patient expects to be discharged to: Private residence  Living Arrangements Spouse/significant other  Available Help at Discharge Family;Available 24 hours/day  Type of Home House  Home Access Ramped entrance  Home Layout One level  Bathroom Shower/Tub Walk-in shower  Bathroom Toilet Handicapped height  Bathroom Accessibility Yes  Home Equipment Rollator (4 wheels)  Prior Function  Prior Level of Function  Needs assist  Physical Assist  Mobility (physical);ADLs (physical)  Mobility (physical) Transfers;Gait  ADLs (physical) Grooming;Bathing;Dressing;Toileting  Mobility Comments pt utilizes rollator for short bouts of ambulation  ADLs  Comments pt's wife assist pt with all ADLs  Pain Assessment  Pain Assessment No/denies pain  Cognition  Arousal Alert  Behavior During Therapy WFL for tasks assessed/performed  PT - Cognitive impairments Memory;Attention;Sequencing;Problem solving  Following Commands  Following commands Impaired  Following commands impaired Follows one step commands with increased time  Cueing  Cueing Techniques Verbal cues;Visual cues  Communication  Communication Impaired  Factors Affecting Communication Hearing impaired  Upper Extremity Assessment  Upper Extremity Assessment Generalized weakness  Lower Extremity Assessment  Lower Extremity Assessment Generalized weakness  Cervical / Trunk Assessment  Cervical / Trunk Assessment Kyphotic  Bed Mobility  Overal bed mobility Needs Assistance  Bed Mobility Supine to Sit;Sit to Supine  Supine to sit Supervision;HOB elevated;Used rails  Sit to supine Supervision;HOB elevated;Used rails  General bed mobility comments VC given for sequencing; increased time to complete.  Transfers  Overall transfer level Needs assistance  Equipment used Rollator (4 wheels)  Transfers Sit to/from Stand  Sit to Stand Contact guard assist  General transfer comment STS from EOB w/ rollator. VC given for sequencing, UE and LE placement, and rollator management. Increased time and effort to complete,  Ambulation/Gait  Ambulation/Gait assistance Contact guard assist  Gait Distance (Feet) 50 Feet  Assistive device Rollator (4 wheels)  Gait Pattern/deviations Step-through pattern;Decreased stride length;Drifts right/left;Trunk flexed  General Gait Details Pt ambulates w/ step through gait and increased reliance on rollator for stability. Pt drifts right/left and demonstrates forward trunk lean. Pt requires VC for step by step sequencing.  Gait velocity reduced  Gait velocity interpretation <1.8 ft/sec, indicate of risk for recurrent falls  Balance  Overall balance  assessment Needs assistance  Sitting-balance support Single extremity supported;Feet supported  Sitting balance-Leahy Scale  Poor  Sitting balance - Comments R lateral and posterior loss of balance requiring physical assistance for obtaining upright position.  Postural control Posterior lean;Right lateral lean  Standing balance support Bilateral upper extremity supported;During functional activity;Reliant on assistive device for balance  Standing balance-Leahy Scale Poor  Standing balance comment reliant on external support  General Comments  General comments (skin integrity, edema, etc.) no signs of acute distressf  PT - End of Session  Equipment Utilized During Treatment Gait belt  Activity Tolerance Patient tolerated treatment well  Patient left in bed;with call bell/phone within reach;with bed alarm set;with family/visitor present  Nurse Communication Mobility status  PT Assessment  PT Recommendation/Assessment Patient needs continued PT services  PT Visit Diagnosis Unsteadiness on feet (R26.81);Other abnormalities of gait and mobility (R26.89);Muscle weakness (generalized) (M62.81);Hemiplegia and hemiparesis  Hemiplegia - Right/Left Left  Hemiplegia - caused by Unspecified  PT Problem List Decreased strength;Decreased range of motion;Decreased activity tolerance;Decreased balance;Decreased mobility;Decreased coordination;Decreased cognition;Decreased knowledge of use of DME  PT Plan  PT Frequency (ACUTE ONLY) Min 2X/week  PT Treatment/Interventions (ACUTE ONLY) DME instruction;Gait training;Functional mobility training;Therapeutic activities;Therapeutic exercise;Balance training;Neuromuscular re-education;Cognitive remediation;Patient/family education;Wheelchair mobility training;Manual techniques;Modalities  AM-PAC PT 6 Clicks Mobility Outcome Measure (Version 2)  Help needed turning from your back to your side while in a flat bed without using bedrails? 3  Help needed moving from  lying on your back to sitting on the side of a flat bed without using bedrails? 3  Help needed moving to and from a bed to a chair (including a wheelchair)? 3  Help needed standing up from a chair using your arms (e.g., wheelchair or bedside chair)? 3  Help needed to walk in hospital room? 3  Help needed climbing 3-5 steps with a railing?  1  6 Click Score 16  Consider Recommendation of Discharge To: Home with [facility]  Progressive Mobility  What is the highest level of mobility based on the progressive mobility assessment? Level 5 (Walks with assist in room/hall) - Balance while stepping forward/back and can walk in room with assist - Complete  Mobility Referral Yes  Activity Ambulated with assistance in hallway  PT Recommendation  Follow Up Recommendations Home health PT  Patient can return home with the following A little help with walking and/or transfers;A lot of help with bathing/dressing/bathroom;Assistance with cooking/housework;Direct supervision/assist for medications management;Direct supervision/assist for financial management;Assist for transportation;Help with stairs or ramp for entrance  Functional Status Assessment Patient has had a recent decline in their functional status and demonstrates the ability to make significant improvements in function in a reasonable and predictable amount of time.  PT equipment None recommended by PT  Individuals Consulted  Consulted and Agree with Results and Recommendations Patient;Family member/caregiver  Family Member Consulted pt's spouse  Acute Rehab PT Goals  Patient Stated Goal to go home  PT Goal Formulation With patient/family  Time For Goal Achievement 01/17/24  Potential to Achieve Goals Fair  PT Time Calculation  PT Start Time (ACUTE ONLY) 1115  PT Stop Time (ACUTE ONLY) 1200  PT Time Calculation (min) (ACUTE ONLY) 45 min   Charges:   PT Evaluation $PT Eval Low Complexity: 1 Low   PT General Charges $$ ACUTE PT VISIT: 1 Visit          Leontine Hilt, SPT Acute Rehab [phone]   Leontine Hilt 01/03/2024, 5:03 PM

## 2024-01-03 NOTE — TOC Initial Note (Addendum)
 Transition of Care (TOC) - Initial/Assessment Note   Spoke to patient and wife at bedside. Patient from home, has wheelchair, walk in tub, commode seat riser at home.   PT recommending HHPT then transition to OP PT. Wife requesting a male HHPT.   Patient has had [facility] in the past. Darleene with [facility] checking to see if he can accept referral. Darleene can accept referral for a male HHPT  Patient Details  Name: Nicholas Mckenzie MRN: [id number] Date of Birth: December 03, 1945  Transition of Care [facility]) CM/SW Contact:    Stephane Powell Jansky, RN Phone Number: 01/03/2024, 12:31 PM  Clinical Narrative:                   Expected Discharge Plan: Home w Home Health Services Barriers to Discharge: Continued Medical Work up   Patient Goals and CMS Choice Patient states their goals for this hospitalization and ongoing recovery are:: to return to home CMS Medicare.gov Compare Post Acute Care list provided to:: Patient Choice offered to / list presented to : Patient, Spouse      Expected Discharge Plan and Services In-house Referral: NA Discharge Planning Services: CM Consult Post Acute Care Choice: Home Health Living arrangements for the past 2 months: Single Family Home                 DME Arranged: N/A DME Agency: NA       HH Arranged: PT HH Agency: [facility] Date [facility] Agency Contacted: 01/03/24 Time HH Agency Contacted: 1228 Representative spoke with at [facility] Agency: [facility]  Prior Living Arrangements/Services Living arrangements for the past 2 months: Single Family Home Lives with:: Spouse Patient language and need for interpreter reviewed:: Yes Do you feel safe going back to the place where you live?: Yes      Need for Family Participation in Patient Care: Yes (Comment) Care giver support system in place?: Yes (comment) Current home services: DME Criminal Activity/Legal Involvement Pertinent to Current Situation/Hospitalization: No - Comment as needed  Activities of Daily Living    ADL Screening (condition at time of admission) Independently performs ADLs?: No Does the patient have a NEW difficulty with bathing/dressing/toileting/self-feeding that is expected to last >3 days?: No Does the patient have a NEW difficulty with getting in/out of bed, walking, or climbing stairs that is expected to last >3 days?: No Does the patient have a NEW difficulty with communication that is expected to last >3 days?: No Is the patient deaf or have difficulty hearing?: Yes Does the patient have difficulty seeing, even when wearing glasses/contacts?: Yes Does the patient have difficulty concentrating, remembering, or making decisions?: No  Permission Sought/Granted   Permission granted to share information with : Yes, Verbal Permission Granted  Share Information with NAME: wife Thedore Pickel  Permission granted to share info w AGENCY: [facility]        Emotional Assessment Appearance:: Appears stated age Attitude/Demeanor/Rapport: Engaged Affect (typically observed): Appropriate Orientation: : Oriented to Self, Oriented to Place, Oriented to  Time, Oriented to Situation Alcohol  / Substance Use: Not Applicable Psych Involvement: No (comment)  Admission diagnosis:  Rectal bleeding [K62.5] GI bleed [K92.2] AKI (acute kidney injury) (HCC) [N17.9] Patient Active Problem List   Diagnosis Date Noted   Rectal bleeding 01/02/2024   GI bleed 01/01/2024   Acute kidney injury superimposed on stage 3b chronic kidney disease (HCC) 01/01/2024   Transfusion of blood product refused for religious reason 09/03/2022   Hx of subdural hematoma 08/16/2021  Nephrolithiasis 08/16/2021   Obesity (BMI 30-39.9) 08/16/2021   OSA (obstructive sleep apnea) 08/16/2021   Frequent urination 06/07/2021   UTI (urinary tract infection) 06/07/2021   Ascending aortic aneurysm (HCC) 06/07/2021   Atrophy of right kidney 06/07/2021   Anemia 06/07/2021   H/O ischemic right PCA stroke 06/06/2021   History of  cardioembolic cerebrovascular accident (CVA) 06/06/2021   Right-sided cerebrovascular accident (CVA) (HCC) 04/14/2021   Nonfunctioning kidney 04/14/2021   Goals of care, counseling/discussion 04/03/2021   Ureteral stent occlusion (HCC) 04/02/2021   Stage 3b chronic kidney disease (HCC) 03/11/2021   Major depressive disorder, recurrent episode, in partial remission (HCC) 03/11/2021   GAD (generalized anxiety disorder) 07/20/2019   Essential (primary) hypertension 05/25/2019   PCP:  Vernon Velna SAUNDERS, MD Pharmacy:   [facility] [phone] [place] [place], [place] - [address] AT [facility] [address] & [facility] [address] [place] [address] Phone: [phone] Fax: [phone]  [facility] [address] [place] [address] Phone: [phone] Fax: [phone]     Social Drivers of Health (SDOH) Social History: SDOH Screenings   Food Insecurity: No Food Insecurity (01/01/2024)  Housing: Low Risk  (01/01/2024)  Transportation Needs: No Transportation Needs (01/01/2024)  Utilities: Not At Risk (01/01/2024)  Depression (PHQ2-9): Low Risk  (06/21/2021)  Social Connections: Socially Integrated (01/01/2024)  Tobacco Use: Low Risk  (01/01/2024)   SDOH Interventions:     Readmission Risk Interventions    08/18/2021    2:36 PM  Readmission Risk Prevention Plan  Transportation Screening Complete  HRI or Home Care Consult Complete  Social Work Consult for Recovery Care Planning/Counseling Complete  Palliative Care Screening Not Applicable  Medication Review Oceanographer) Complete

## 2024-01-03 NOTE — Plan of Care (Signed)

## 2024-01-03 NOTE — Progress Notes (Signed)
 Physical Therapy Quick Note  PT has completed initial evaluation.    Overall, patient at [facility] assistance level.   PT Follow up recommended: Home Health PT, with eventual transition to outpatient. Equipment recommended:  None recommended Complete evaluation note to follow.     Nicholas Mckenzie, PT, DPT Acute Rehabilitation Office [phone]

## 2024-01-04 LAB — URINE CULTURE: Culture: 30000 — AB

## 2024-01-09 DIAGNOSIS — Z9181 History of falling: Secondary | ICD-10-CM | POA: Diagnosis not present

## 2024-01-09 DIAGNOSIS — I129 Hypertensive chronic kidney disease with stage 1 through stage 4 chronic kidney disease, or unspecified chronic kidney disease: Secondary | ICD-10-CM | POA: Diagnosis not present

## 2024-01-09 DIAGNOSIS — N3289 Other specified disorders of bladder: Secondary | ICD-10-CM | POA: Diagnosis not present

## 2024-01-09 DIAGNOSIS — N1832 Chronic kidney disease, stage 3b: Secondary | ICD-10-CM | POA: Diagnosis not present

## 2024-01-09 DIAGNOSIS — Z87442 Personal history of urinary calculi: Secondary | ICD-10-CM | POA: Diagnosis not present

## 2024-01-09 DIAGNOSIS — I69218 Other symptoms and signs involving cognitive functions following other nontraumatic intracranial hemorrhage: Secondary | ICD-10-CM | POA: Diagnosis not present

## 2024-01-09 DIAGNOSIS — I7121 Aneurysm of the ascending aorta, without rupture: Secondary | ICD-10-CM | POA: Diagnosis not present

## 2024-01-09 DIAGNOSIS — G4733 Obstructive sleep apnea (adult) (pediatric): Secondary | ICD-10-CM | POA: Diagnosis not present

## 2024-01-09 DIAGNOSIS — N390 Urinary tract infection, site not specified: Secondary | ICD-10-CM | POA: Diagnosis not present

## 2024-01-09 DIAGNOSIS — R339 Retention of urine, unspecified: Secondary | ICD-10-CM | POA: Diagnosis not present

## 2024-01-09 DIAGNOSIS — I69254 Hemiplegia and hemiparesis following other nontraumatic intracranial hemorrhage affecting left non-dominant side: Secondary | ICD-10-CM | POA: Diagnosis not present

## 2024-01-09 DIAGNOSIS — N179 Acute kidney failure, unspecified: Secondary | ICD-10-CM | POA: Diagnosis not present

## 2024-01-09 DIAGNOSIS — D631 Anemia in chronic kidney disease: Secondary | ICD-10-CM | POA: Diagnosis not present

## 2024-01-09 DIAGNOSIS — Z905 Acquired absence of kidney: Secondary | ICD-10-CM | POA: Diagnosis not present

## 2024-01-10 DIAGNOSIS — I10 Essential (primary) hypertension: Secondary | ICD-10-CM | POA: Diagnosis not present

## 2024-01-10 DIAGNOSIS — N1831 Chronic kidney disease, stage 3a: Secondary | ICD-10-CM | POA: Diagnosis not present

## 2024-01-10 DIAGNOSIS — K649 Unspecified hemorrhoids: Secondary | ICD-10-CM | POA: Diagnosis not present

## 2024-01-10 DIAGNOSIS — N390 Urinary tract infection, site not specified: Secondary | ICD-10-CM | POA: Diagnosis not present

## 2024-01-10 DIAGNOSIS — K922 Gastrointestinal hemorrhage, unspecified: Secondary | ICD-10-CM | POA: Diagnosis not present

## 2024-01-14 DIAGNOSIS — I69254 Hemiplegia and hemiparesis following other nontraumatic intracranial hemorrhage affecting left non-dominant side: Secondary | ICD-10-CM | POA: Diagnosis not present

## 2024-01-14 DIAGNOSIS — N390 Urinary tract infection, site not specified: Secondary | ICD-10-CM | POA: Diagnosis not present

## 2024-01-14 DIAGNOSIS — R339 Retention of urine, unspecified: Secondary | ICD-10-CM | POA: Diagnosis not present

## 2024-01-14 DIAGNOSIS — I69218 Other symptoms and signs involving cognitive functions following other nontraumatic intracranial hemorrhage: Secondary | ICD-10-CM | POA: Diagnosis not present

## 2024-01-14 DIAGNOSIS — Z905 Acquired absence of kidney: Secondary | ICD-10-CM | POA: Diagnosis not present

## 2024-01-14 DIAGNOSIS — I7121 Aneurysm of the ascending aorta, without rupture: Secondary | ICD-10-CM | POA: Diagnosis not present

## 2024-01-14 DIAGNOSIS — D631 Anemia in chronic kidney disease: Secondary | ICD-10-CM | POA: Diagnosis not present

## 2024-01-14 DIAGNOSIS — I129 Hypertensive chronic kidney disease with stage 1 through stage 4 chronic kidney disease, or unspecified chronic kidney disease: Secondary | ICD-10-CM | POA: Diagnosis not present

## 2024-01-14 DIAGNOSIS — N3289 Other specified disorders of bladder: Secondary | ICD-10-CM | POA: Diagnosis not present

## 2024-01-14 DIAGNOSIS — N179 Acute kidney failure, unspecified: Secondary | ICD-10-CM | POA: Diagnosis not present

## 2024-01-14 DIAGNOSIS — Z9181 History of falling: Secondary | ICD-10-CM | POA: Diagnosis not present

## 2024-01-14 DIAGNOSIS — N1832 Chronic kidney disease, stage 3b: Secondary | ICD-10-CM | POA: Diagnosis not present

## 2024-01-14 DIAGNOSIS — G4733 Obstructive sleep apnea (adult) (pediatric): Secondary | ICD-10-CM | POA: Diagnosis not present

## 2024-01-14 DIAGNOSIS — Z87442 Personal history of urinary calculi: Secondary | ICD-10-CM | POA: Diagnosis not present

## 2024-01-16 ENCOUNTER — Encounter (HOSPITAL_BASED_OUTPATIENT_CLINIC_OR_DEPARTMENT_OTHER): Payer: Self-pay

## 2024-01-16 ENCOUNTER — Emergency Department (HOSPITAL_BASED_OUTPATIENT_CLINIC_OR_DEPARTMENT_OTHER)

## 2024-01-16 ENCOUNTER — Emergency Department (HOSPITAL_BASED_OUTPATIENT_CLINIC_OR_DEPARTMENT_OTHER)
Admission: EM | Admit: 2024-01-16 | Discharge: 2024-01-16 | Disposition: A | Discharge status: Home / Self Care | Attending: Emergency Medicine | Admitting: Emergency Medicine

## 2024-01-16 ENCOUNTER — Other Ambulatory Visit: Payer: Self-pay

## 2024-01-16 DIAGNOSIS — R350 Frequency of micturition: Secondary | ICD-10-CM | POA: Insufficient documentation

## 2024-01-16 DIAGNOSIS — Z79899 Other long term (current) drug therapy: Secondary | ICD-10-CM | POA: Diagnosis not present

## 2024-01-16 DIAGNOSIS — Z905 Acquired absence of kidney: Secondary | ICD-10-CM | POA: Diagnosis not present

## 2024-01-16 DIAGNOSIS — Z87442 Personal history of urinary calculi: Secondary | ICD-10-CM | POA: Diagnosis not present

## 2024-01-16 DIAGNOSIS — I771 Stricture of artery: Secondary | ICD-10-CM | POA: Diagnosis not present

## 2024-01-16 DIAGNOSIS — I129 Hypertensive chronic kidney disease with stage 1 through stage 4 chronic kidney disease, or unspecified chronic kidney disease: Secondary | ICD-10-CM | POA: Insufficient documentation

## 2024-01-16 DIAGNOSIS — R32 Unspecified urinary incontinence: Secondary | ICD-10-CM | POA: Diagnosis not present

## 2024-01-16 DIAGNOSIS — N1832 Chronic kidney disease, stage 3b: Secondary | ICD-10-CM | POA: Diagnosis not present

## 2024-01-16 DIAGNOSIS — Z9181 History of falling: Secondary | ICD-10-CM | POA: Diagnosis not present

## 2024-01-16 DIAGNOSIS — N3289 Other specified disorders of bladder: Secondary | ICD-10-CM | POA: Diagnosis not present

## 2024-01-16 DIAGNOSIS — N179 Acute kidney failure, unspecified: Secondary | ICD-10-CM | POA: Diagnosis not present

## 2024-01-16 DIAGNOSIS — N189 Chronic kidney disease, unspecified: Secondary | ICD-10-CM | POA: Diagnosis not present

## 2024-01-16 DIAGNOSIS — I69254 Hemiplegia and hemiparesis following other nontraumatic intracranial hemorrhage affecting left non-dominant side: Secondary | ICD-10-CM | POA: Diagnosis not present

## 2024-01-16 DIAGNOSIS — R410 Disorientation, unspecified: Secondary | ICD-10-CM | POA: Diagnosis not present

## 2024-01-16 DIAGNOSIS — D631 Anemia in chronic kidney disease: Secondary | ICD-10-CM | POA: Diagnosis not present

## 2024-01-16 DIAGNOSIS — G4733 Obstructive sleep apnea (adult) (pediatric): Secondary | ICD-10-CM | POA: Diagnosis not present

## 2024-01-16 DIAGNOSIS — R339 Retention of urine, unspecified: Secondary | ICD-10-CM | POA: Diagnosis not present

## 2024-01-16 DIAGNOSIS — J9859 Other diseases of mediastinum, not elsewhere classified: Secondary | ICD-10-CM | POA: Diagnosis not present

## 2024-01-16 DIAGNOSIS — I69218 Other symptoms and signs involving cognitive functions following other nontraumatic intracranial hemorrhage: Secondary | ICD-10-CM | POA: Diagnosis not present

## 2024-01-16 DIAGNOSIS — R531 Weakness: Secondary | ICD-10-CM | POA: Insufficient documentation

## 2024-01-16 DIAGNOSIS — N390 Urinary tract infection, site not specified: Secondary | ICD-10-CM | POA: Diagnosis not present

## 2024-01-16 DIAGNOSIS — I7121 Aneurysm of the ascending aorta, without rupture: Secondary | ICD-10-CM | POA: Diagnosis not present

## 2024-01-16 LAB — BASIC METABOLIC PANEL WITH GFR
Anion gap: 12 (ref 5–15)
BUN: 22 mg/dL (ref 8–23)
CO2: 26 mmol/L (ref 22–32)
Calcium: 9.9 mg/dL (ref 8.9–10.3)
Chloride: 103 mmol/L (ref 98–111)
Creatinine, Ser: 2.01 mg/dL — ABNORMAL HIGH (ref 0.61–1.24)
GFR, Estimated: 34 mL/min — ABNORMAL LOW (ref >60)
Glucose, Bld: 108 mg/dL — ABNORMAL HIGH (ref 70–99)
Potassium: 3.4 mmol/L — ABNORMAL LOW (ref 3.5–5.1)
Sodium: 141 mmol/L (ref 135–145)

## 2024-01-16 LAB — CBC WITH DIFFERENTIAL/PLATELET
Abs Immature Granulocytes: 0.04 K/uL (ref 0.00–0.07)
Basophils Absolute: 0 K/uL (ref 0.0–0.1)
Basophils Relative: 0 %
Eosinophils Absolute: 0.4 K/uL (ref 0.0–0.5)
Eosinophils Relative: 4 %
HCT: 41.6 % (ref 39.0–52.0)
Hemoglobin: 13.4 g/dL (ref 13.0–17.0)
Immature Granulocytes: 0 %
Lymphocytes Relative: 17 %
Lymphs Abs: 1.7 K/uL (ref 0.7–4.0)
MCH: 28.5 pg (ref 26.0–34.0)
MCHC: 32.2 g/dL (ref 30.0–36.0)
MCV: 88.3 fL (ref 80.0–100.0)
Monocytes Absolute: 0.7 K/uL (ref 0.1–1.0)
Monocytes Relative: 7 %
Neutro Abs: 7.3 K/uL (ref 1.7–7.7)
Neutrophils Relative %: 72 %
Platelets: 189 K/uL (ref 150–400)
RBC: 4.71 MIL/uL (ref 4.22–5.81)
RDW: 14.4 % (ref 11.5–15.5)
WBC: 10.2 K/uL (ref 4.0–10.5)
nRBC: 0 % (ref 0.0–0.2)

## 2024-01-16 LAB — URINALYSIS, ROUTINE W REFLEX MICROSCOPIC
Bacteria, UA: NONE SEEN
Bilirubin Urine: NEGATIVE
Glucose, UA: NEGATIVE mg/dL
Ketones, ur: NEGATIVE mg/dL
Nitrite: NEGATIVE
Specific Gravity, Urine: 1.012 (ref 1.005–1.030)
WBC, UA: >50 WBC/hpf (ref 0–5)
pH: 7.5 (ref 5.0–8.0)

## 2024-01-16 LAB — CBG MONITORING, ED: Glucose-Capillary: 118 mg/dL — ABNORMAL HIGH (ref 70–99)

## 2024-01-16 MED ORDER — SODIUM CHLORIDE 0.9 % IV BOLUS
250.0000 mL | Freq: Once | INTRAVENOUS | Status: AC
  Administered 2024-01-16: 250 mL via INTRAVENOUS

## 2024-01-16 MED ORDER — FOSFOMYCIN TROMETHAMINE 3 G PO PACK
3.0000 g | PACK | Freq: Once | ORAL | Status: AC
  Administered 2024-01-16: 3 g via ORAL
  Filled 2024-01-16: qty 3

## 2024-01-16 NOTE — ED Notes (Signed)
 Attempted in & out and was unsuccessful.

## 2024-01-16 NOTE — ED Triage Notes (Addendum)
 Pts wife c/o of confusion starting at 9pm before bed. Not being able to follow commands. Seen at PT today, for previous stroke from last year. Left side deficits. Was not following commands at PT today and had an episode of diarrhea. A/Ox4 in triage.

## 2024-01-16 NOTE — Discharge Instructions (Signed)
 Return if symptoms worsen.  Monitor for fever.  If fever greater than 100.4 or worsening symptoms please return.

## 2024-01-16 NOTE — ED Provider Notes (Signed)
 Nicholas Mckenzie EMERGENCY DEPARTMENT AT [facility] Provider Note   CSN: [id number] Arrival date & time: 01/16/24  1114     Patient presents with: Altered Mental Status and Urinary Frequency   Nicholas Mckenzie is a 78 y.o. male.   Patient here with some confusion increased frequency.  Denies any weakness numbness tingling.  He has been a little bit slow to respond today but no fever.  Has had prior stroke.  Left-sided weakness at baseline.  He is had issues with urinary tract infections in the past.  History of hypertension CKD.  He denies any cough or sputum production.  Denies any chest pain shortness of breath.  He has been having some incontinence issues but somewhat chronic for him.  Family concern for UTI.  The history is provided by the patient.       Prior to Admission medications   Medication Sig Start Date End Date Taking? Authorizing Provider  amLODipine  (NORVASC ) 10 MG tablet Take 10 mg by mouth every evening.    [provider]  GLUTATHIONE PO Take 1 Dose by mouth daily. Both OTC Glutathione drink and oral spray.    [provider]  OVER THE COUNTER MEDICATION Take 1 capsule by mouth daily. OTC Emu Oil capsules    [provider]  OVER THE COUNTER MEDICATION Take 1 Dose by mouth daily. Total-B tincture    [provider]  OVER THE COUNTER MEDICATION Take 1 Capful by mouth daily. Kidney Complete liquid    [provider]  OVER THE COUNTER MEDICATION Take 1 Bottle by mouth daily. 120 Life, liquid supplement    [provider]  OVER THE COUNTER MEDICATION Take 1 tablet by mouth daily. OTC bone/muscle supplement    [provider]  oxybutynin  (DITROPAN ) 5 MG tablet Take 1 tablet (5 mg total) by mouth every 8 (eight) hours as needed for bladder spasms. 01/03/24   Jillian Buttery, MD  saccharomyces boulardii (FLORASTOR) 250 MG capsule Take 1 capsule (250 mg total) by mouth 2 (two) times daily. 01/03/24   Jillian Buttery,  MD    Allergies: Statins, Other, and Tape    Review of Systems  Updated Vital Signs BP (!) 198/101   Pulse (!) 56   Temp 98.7 F (37.1 C) (Oral)   Resp 13   Ht 5' 8 (1.727 m)   Wt 90.7 kg   SpO2 95%   BMI 30.41 kg/m   Physical Exam Vitals and nursing note reviewed.  Constitutional:      General: He is not in acute distress.    Appearance: He is well-developed. He is not ill-appearing.  HENT:     Head: Normocephalic and atraumatic.     Nose: Nose normal.     Mouth/Throat:     Mouth: Mucous membranes are moist.  Eyes:     Conjunctiva/sclera: Conjunctivae normal.     Pupils: Pupils are equal, round, and reactive to light.  Cardiovascular:     Rate and Rhythm: Normal rate and regular rhythm.     Pulses: Normal pulses.     Heart sounds: Normal heart sounds. No murmur heard. Pulmonary:     Effort: Pulmonary effort is normal. No respiratory distress.     Breath sounds: Normal breath sounds.  Abdominal:     Palpations: Abdomen is soft.     Tenderness: There is no abdominal tenderness.  Musculoskeletal:        General: No swelling.     Cervical back: Normal range of  motion and neck supple.  Skin:    General: Skin is warm and dry.     Capillary Refill: Capillary refill takes less than 2 seconds.  Neurological:     General: No focal deficit present.     Mental Status: He is alert.     Comments: Patient is alert follows commands strength exams overall unremarkable.  Moves some trace weakness in the left side compared to the right.  Denies any visual field issues, digital fields intact, speech is intact  Psychiatric:        Mood and Affect: Mood normal.     (all labs ordered are listed, but only abnormal results are displayed) Labs Reviewed  URINALYSIS, ROUTINE W REFLEX MICROSCOPIC - Abnormal; Notable for the following components:      Result Value   Hgb urine dipstick SMALL (*)    Protein, ur TRACE (*)    Leukocytes,Ua LARGE (*)    All other components within  normal limits  BASIC METABOLIC PANEL WITH GFR - Abnormal; Notable for the following components:   Potassium 3.4 (*)    Glucose, Bld 108 (*)    Creatinine, Ser 2.01 (*)    GFR, Estimated 34 (*)    All other components within normal limits  CBG MONITORING, ED - Abnormal; Notable for the following components:   Glucose-Capillary 118 (*)    All other components within normal limits  URINE CULTURE  CBC WITH DIFFERENTIAL/PLATELET    EKG: EKG Interpretation Date/Time:  Thursday January 16 2024 11:34:43 EDT Ventricular Rate:  73 PR Interval:  217 QRS Duration:  80 QT Interval:  383 QTC Calculation: 422 R Axis:   24  Text Interpretation: Sinus rhythm Atrial premature complex Confirmed by Ruthe Cornet [phone]) on 01/16/2024 11:35:15 AM  Radiology: CT Head Wo Contrast Result Date: 01/16/2024 CLINICAL DATA:  Mental status change, unknown cause EXAM: CT HEAD WITHOUT CONTRAST TECHNIQUE: Contiguous axial images were obtained from the base of the skull through the vertex without intravenous contrast. RADIATION DOSE REDUCTION: This exam was performed according to the departmental dose-optimization program which includes automated exposure control, adjustment of the mA and/or kV according to patient size and/or use of iterative reconstruction technique. COMPARISON:  August 16, 2021, July 18, 2021 FINDINGS: Brain: Proportional prominence of the ventricles and sulci, consistent with diffuse cerebral parenchymal volume loss. The ventricles otherwise maintained midline position without midline shift. Gray-white differentiation is preserved.Confluent periventricular and subcortical white matter hypoattenuation, most consistent with changes of severe chronic ischemic microvascular disease. Chronic right occipital lobe infarct. No evidence of acute territorial infarction, extra-axial fluid collection, hemorrhage, or mass lesion. The basilar cisterns are patent without downward herniation. The cerebellar  hemispheres and vermis are well formed without mass lesion or focal attenuation abnormality. Chronic lacunar infarct within the right cerebellar hemisphere. Vascular: No hyperdense vessel. Calcified atherosclerotic plaque within the cavernous/supraclinoid ICA and intradural vertebral arteries. Skull: Normal. Negative for fracture or focal lesion. Sinuses/Orbits: Mild mucosal thickening within the frontal and ethmoid sinuses. No air-fluid levels.The globes appear intact. No retrobulbar hematoma.Bilateral lens replacements. Other: None. IMPRESSION: 1. No acute intracranial abnormality, specifically, no acute hemorrhage, territorial infarction, or intracranial mass. 2. Global cerebral volume loss with sequelae of advanced chronic ischemic microvascular disease. Electronically Signed   By: Rogelia Myers M.D.   On: 01/16/2024 12:42   DG Chest Portable 1 View Result Date: 01/16/2024 CLINICAL DATA:  Weakness.  Confusion EXAM: PORTABLE CHEST 1 VIEW COMPARISON:  Chest x-ray 01/01/2024 FINDINGS: Enlarged cardiopericardial silhouette with tortuous  ectatic aorta and widened upper mediastinum, unchanged from previous. No consolidation, pneumothorax or effusion. Overlapping cardiac leads. Film is under penetrated. IMPRESSION: Enlarged heart with tortuous ectatic aorta and stable widened mediastinum. No consolidation or effusion. Electronically Signed   By: Ranell Bring M.D.   On: 01/16/2024 12:27     Procedures   Medications Ordered in the ED  fosfomycin (MONUROL ) packet 3 g (has no administration in time range)  sodium chloride  0.9 % bolus 250 mL (0 mLs Intravenous Stopped 01/16/24 1425)                                    Medical Decision Making Amount and/or Complexity of Data Reviewed Labs: ordered. Radiology: ordered.  Risk Prescription drug management.   Nicholas Mckenzie is here with concern for urinary tract infection.  History of CKD hypertension kidney stones nephrostomy.  Unremarkable vitals.  No  fever.  His neurological exam is overall unremarkable.  Answers questions appropriately.  Seems like he has been a little bit slow to follow commands this morning but seems to be doing better.  He has had some increased urinary frequency consistent with prior UTIs.  He has no abdominal pain chest pain shortness of breath cough sputum production or fever.  Overall differential diagnosis could be electrolyte abnormality dehydration infectious process UTI pneumonia.  Seems less likely to be stroke.  Will get a head CT chest x-ray basic labs urinalysis and reevaluate.  Lab work per my review interpretation shows no significant leukocytosis anemia or electrolyte abnormality.  Chest x-ray negative for pneumonia.  Head CT unremarkable.  Urinalysis looks more like contamination than infection but will treat with a dose of fosfomycin.  Will send urine culture.  Overall no major acute findings at this time.  Follow-up closely with primary care doctor.  Told to return if symptoms worsen.  Discharge.  Clinically I have very low suspicion for stroke or any other acute process.  He seems to be at his baseline.  This chart was dictated using voice recognition software.  Despite best efforts to proofread,  errors can occur which can change the documentation meaning.      Final diagnoses:  Weakness    ED Discharge Orders     None          Ruthe Cornet, DO 01/16/24 1432

## 2024-01-21 DIAGNOSIS — R339 Retention of urine, unspecified: Secondary | ICD-10-CM | POA: Diagnosis not present

## 2024-01-21 DIAGNOSIS — Z905 Acquired absence of kidney: Secondary | ICD-10-CM | POA: Diagnosis not present

## 2024-01-21 DIAGNOSIS — I69218 Other symptoms and signs involving cognitive functions following other nontraumatic intracranial hemorrhage: Secondary | ICD-10-CM | POA: Diagnosis not present

## 2024-01-21 DIAGNOSIS — D631 Anemia in chronic kidney disease: Secondary | ICD-10-CM | POA: Diagnosis not present

## 2024-01-21 DIAGNOSIS — Z87442 Personal history of urinary calculi: Secondary | ICD-10-CM | POA: Diagnosis not present

## 2024-01-21 DIAGNOSIS — Z9181 History of falling: Secondary | ICD-10-CM | POA: Diagnosis not present

## 2024-01-21 DIAGNOSIS — N179 Acute kidney failure, unspecified: Secondary | ICD-10-CM | POA: Diagnosis not present

## 2024-01-21 DIAGNOSIS — I69254 Hemiplegia and hemiparesis following other nontraumatic intracranial hemorrhage affecting left non-dominant side: Secondary | ICD-10-CM | POA: Diagnosis not present

## 2024-01-21 DIAGNOSIS — I129 Hypertensive chronic kidney disease with stage 1 through stage 4 chronic kidney disease, or unspecified chronic kidney disease: Secondary | ICD-10-CM | POA: Diagnosis not present

## 2024-01-21 DIAGNOSIS — N3289 Other specified disorders of bladder: Secondary | ICD-10-CM | POA: Diagnosis not present

## 2024-01-21 DIAGNOSIS — N390 Urinary tract infection, site not specified: Secondary | ICD-10-CM | POA: Diagnosis not present

## 2024-01-21 DIAGNOSIS — N1832 Chronic kidney disease, stage 3b: Secondary | ICD-10-CM | POA: Diagnosis not present

## 2024-01-21 DIAGNOSIS — G4733 Obstructive sleep apnea (adult) (pediatric): Secondary | ICD-10-CM | POA: Diagnosis not present

## 2024-01-21 DIAGNOSIS — I7121 Aneurysm of the ascending aorta, without rupture: Secondary | ICD-10-CM | POA: Diagnosis not present

## 2024-01-23 DIAGNOSIS — Z87442 Personal history of urinary calculi: Secondary | ICD-10-CM | POA: Diagnosis not present

## 2024-01-23 DIAGNOSIS — Z905 Acquired absence of kidney: Secondary | ICD-10-CM | POA: Diagnosis not present

## 2024-01-23 DIAGNOSIS — I69254 Hemiplegia and hemiparesis following other nontraumatic intracranial hemorrhage affecting left non-dominant side: Secondary | ICD-10-CM | POA: Diagnosis not present

## 2024-01-23 DIAGNOSIS — I69218 Other symptoms and signs involving cognitive functions following other nontraumatic intracranial hemorrhage: Secondary | ICD-10-CM | POA: Diagnosis not present

## 2024-01-23 DIAGNOSIS — I7121 Aneurysm of the ascending aorta, without rupture: Secondary | ICD-10-CM | POA: Diagnosis not present

## 2024-01-23 DIAGNOSIS — I129 Hypertensive chronic kidney disease with stage 1 through stage 4 chronic kidney disease, or unspecified chronic kidney disease: Secondary | ICD-10-CM | POA: Diagnosis not present

## 2024-01-23 DIAGNOSIS — N390 Urinary tract infection, site not specified: Secondary | ICD-10-CM | POA: Diagnosis not present

## 2024-01-23 DIAGNOSIS — R339 Retention of urine, unspecified: Secondary | ICD-10-CM | POA: Diagnosis not present

## 2024-01-23 DIAGNOSIS — N1832 Chronic kidney disease, stage 3b: Secondary | ICD-10-CM | POA: Diagnosis not present

## 2024-01-23 DIAGNOSIS — G4733 Obstructive sleep apnea (adult) (pediatric): Secondary | ICD-10-CM | POA: Diagnosis not present

## 2024-01-23 DIAGNOSIS — D631 Anemia in chronic kidney disease: Secondary | ICD-10-CM | POA: Diagnosis not present

## 2024-01-23 DIAGNOSIS — Z9181 History of falling: Secondary | ICD-10-CM | POA: Diagnosis not present

## 2024-01-23 DIAGNOSIS — N179 Acute kidney failure, unspecified: Secondary | ICD-10-CM | POA: Diagnosis not present

## 2024-01-23 DIAGNOSIS — N3289 Other specified disorders of bladder: Secondary | ICD-10-CM | POA: Diagnosis not present

## 2024-01-28 DIAGNOSIS — I69254 Hemiplegia and hemiparesis following other nontraumatic intracranial hemorrhage affecting left non-dominant side: Secondary | ICD-10-CM | POA: Diagnosis not present

## 2024-01-28 DIAGNOSIS — I7121 Aneurysm of the ascending aorta, without rupture: Secondary | ICD-10-CM | POA: Diagnosis not present

## 2024-01-28 DIAGNOSIS — G4733 Obstructive sleep apnea (adult) (pediatric): Secondary | ICD-10-CM | POA: Diagnosis not present

## 2024-01-28 DIAGNOSIS — N1832 Chronic kidney disease, stage 3b: Secondary | ICD-10-CM | POA: Diagnosis not present

## 2024-01-28 DIAGNOSIS — D631 Anemia in chronic kidney disease: Secondary | ICD-10-CM | POA: Diagnosis not present

## 2024-01-28 DIAGNOSIS — I69218 Other symptoms and signs involving cognitive functions following other nontraumatic intracranial hemorrhage: Secondary | ICD-10-CM | POA: Diagnosis not present

## 2024-01-28 DIAGNOSIS — I129 Hypertensive chronic kidney disease with stage 1 through stage 4 chronic kidney disease, or unspecified chronic kidney disease: Secondary | ICD-10-CM | POA: Diagnosis not present

## 2024-01-28 DIAGNOSIS — N3289 Other specified disorders of bladder: Secondary | ICD-10-CM | POA: Diagnosis not present

## 2024-01-28 DIAGNOSIS — Z9181 History of falling: Secondary | ICD-10-CM | POA: Diagnosis not present

## 2024-01-28 DIAGNOSIS — N179 Acute kidney failure, unspecified: Secondary | ICD-10-CM | POA: Diagnosis not present

## 2024-01-28 DIAGNOSIS — R339 Retention of urine, unspecified: Secondary | ICD-10-CM | POA: Diagnosis not present

## 2024-01-28 DIAGNOSIS — Z905 Acquired absence of kidney: Secondary | ICD-10-CM | POA: Diagnosis not present

## 2024-01-28 DIAGNOSIS — Z87442 Personal history of urinary calculi: Secondary | ICD-10-CM | POA: Diagnosis not present

## 2024-01-30 DIAGNOSIS — Z905 Acquired absence of kidney: Secondary | ICD-10-CM | POA: Diagnosis not present

## 2024-02-06 DIAGNOSIS — I69218 Other symptoms and signs involving cognitive functions following other nontraumatic intracranial hemorrhage: Secondary | ICD-10-CM | POA: Diagnosis not present

## 2024-02-06 DIAGNOSIS — N179 Acute kidney failure, unspecified: Secondary | ICD-10-CM | POA: Diagnosis not present

## 2024-02-06 DIAGNOSIS — I69254 Hemiplegia and hemiparesis following other nontraumatic intracranial hemorrhage affecting left non-dominant side: Secondary | ICD-10-CM | POA: Diagnosis not present

## 2024-02-06 DIAGNOSIS — N1832 Chronic kidney disease, stage 3b: Secondary | ICD-10-CM | POA: Diagnosis not present

## 2024-02-06 DIAGNOSIS — R339 Retention of urine, unspecified: Secondary | ICD-10-CM | POA: Diagnosis not present

## 2024-02-06 DIAGNOSIS — N390 Urinary tract infection, site not specified: Secondary | ICD-10-CM | POA: Diagnosis not present

## 2024-02-06 DIAGNOSIS — D631 Anemia in chronic kidney disease: Secondary | ICD-10-CM | POA: Diagnosis not present

## 2024-02-06 DIAGNOSIS — Z905 Acquired absence of kidney: Secondary | ICD-10-CM | POA: Diagnosis not present

## 2024-02-06 DIAGNOSIS — I7121 Aneurysm of the ascending aorta, without rupture: Secondary | ICD-10-CM | POA: Diagnosis not present

## 2024-02-06 DIAGNOSIS — N3289 Other specified disorders of bladder: Secondary | ICD-10-CM | POA: Diagnosis not present

## 2024-02-06 DIAGNOSIS — I129 Hypertensive chronic kidney disease with stage 1 through stage 4 chronic kidney disease, or unspecified chronic kidney disease: Secondary | ICD-10-CM | POA: Diagnosis not present

## 2024-02-06 DIAGNOSIS — G4733 Obstructive sleep apnea (adult) (pediatric): Secondary | ICD-10-CM | POA: Diagnosis not present

## 2024-02-06 DIAGNOSIS — Z87442 Personal history of urinary calculi: Secondary | ICD-10-CM | POA: Diagnosis not present

## 2024-02-11 DIAGNOSIS — N3289 Other specified disorders of bladder: Secondary | ICD-10-CM | POA: Diagnosis not present

## 2024-02-13 ENCOUNTER — Other Ambulatory Visit (HOSPITAL_COMMUNITY): Payer: Self-pay

## 2024-02-14 ENCOUNTER — Other Ambulatory Visit: Payer: Self-pay | Admitting: *Deleted

## 2024-02-14 ENCOUNTER — Other Ambulatory Visit

## 2024-02-14 ENCOUNTER — Ambulatory Visit: Admitting: Physician Assistant

## 2024-02-14 ENCOUNTER — Encounter: Payer: Self-pay | Admitting: Physician Assistant

## 2024-02-14 ENCOUNTER — Ambulatory Visit: Payer: Self-pay | Admitting: Physician Assistant

## 2024-02-14 VITALS — BP 140/90 | HR 74 | Ht 68.0 in | Wt 202.0 lb

## 2024-02-14 DIAGNOSIS — N1832 Chronic kidney disease, stage 3b: Secondary | ICD-10-CM | POA: Diagnosis not present

## 2024-02-14 DIAGNOSIS — K625 Hemorrhage of anus and rectum: Secondary | ICD-10-CM | POA: Diagnosis not present

## 2024-02-14 DIAGNOSIS — K921 Melena: Secondary | ICD-10-CM | POA: Diagnosis not present

## 2024-02-14 DIAGNOSIS — Z8673 Personal history of transient ischemic attack (TIA), and cerebral infarction without residual deficits: Secondary | ICD-10-CM

## 2024-02-14 LAB — CBC WITH DIFFERENTIAL/PLATELET
Basophils Absolute: 0 K/uL (ref 0.0–0.1)
Basophils Relative: 0.6 % (ref 0.0–3.0)
Eosinophils Absolute: 0.3 K/uL (ref 0.0–0.7)
Eosinophils Relative: 3.2 % (ref 0.0–5.0)
HCT: 40 % (ref 39.0–52.0)
Hemoglobin: 13.3 g/dL (ref 13.0–17.0)
Lymphocytes Relative: 18 % (ref 12.0–46.0)
Lymphs Abs: 1.5 K/uL (ref 0.7–4.0)
MCHC: 33.1 g/dL (ref 30.0–36.0)
MCV: 85.3 fl (ref 78.0–100.0)
Monocytes Absolute: 0.8 K/uL (ref 0.1–1.0)
Monocytes Relative: 9.4 % (ref 3.0–12.0)
Neutro Abs: 5.8 K/uL (ref 1.4–7.7)
Neutrophils Relative %: 68.8 % (ref 43.0–77.0)
Platelets: 173 K/uL (ref 150.0–400.0)
RBC: 4.69 Mil/uL (ref 4.22–5.81)
RDW: 15.5 % (ref 11.5–15.5)
WBC: 8.5 K/uL (ref 4.0–10.5)

## 2024-02-14 LAB — COMPREHENSIVE METABOLIC PANEL WITH GFR
ALT: 9 U/L (ref 0–53)
AST: 9 U/L (ref 0–37)
Albumin: 4.1 g/dL (ref 3.5–5.2)
Alkaline Phosphatase: 43 U/L (ref 39–117)
BUN: 30 mg/dL — ABNORMAL HIGH (ref 6–23)
CO2: 25 meq/L (ref 19–32)
Calcium: 9 mg/dL (ref 8.4–10.5)
Chloride: 103 meq/L (ref 96–112)
Creatinine, Ser: 1.67 mg/dL — ABNORMAL HIGH (ref 0.40–1.50)
GFR: 39.42 mL/min — ABNORMAL LOW (ref >60.00)
Glucose, Bld: 110 mg/dL — ABNORMAL HIGH (ref 70–99)
Potassium: 3.2 meq/L — ABNORMAL LOW (ref 3.5–5.1)
Sodium: 140 meq/L (ref 135–145)
Total Bilirubin: 0.5 mg/dL (ref 0.2–1.2)
Total Protein: 6.9 g/dL (ref 6.0–8.3)

## 2024-02-14 LAB — IBC + FERRITIN
Ferritin: 95.2 ng/mL (ref 22.0–322.0)
Iron: 32 ug/dL — ABNORMAL LOW (ref 42–165)
Saturation Ratios: 12 % — ABNORMAL LOW (ref 20.0–50.0)
TIBC: 266 ug/dL (ref 250.0–450.0)
Transferrin: 190 mg/dL — ABNORMAL LOW (ref 212.0–360.0)

## 2024-02-14 MED ORDER — NA SULFATE-K SULFATE-MG SULF 17.5-3.13-1.6 GM/177ML PO SOLN: 1.0000 | Freq: Once | ORAL | 0 refills | Status: AC

## 2024-02-14 NOTE — Progress Notes (Signed)
 Chief Complaint: Follow up hospitalization for hematochezia  HPI:    Nicholas Mckenzie is a 78 year old Jehovah's Witness, with a past medical history as listed below's including stroke (04/05/2021 echo with LVEF 60-65%), CKD stage IIIb and multiple others, assigned to Dr. Legrand at last hospitalization, who presents to clinic today for follow-up after recent hospitalization for hematochezia.    Patient reported a colonoscopy 10 years ago in [place]  with removal of 2 polyps.  No family history of colon cancer.    01/01/2024 patient seen in consult prior service for recurrent UTIs and admitted with bladder pain but also found to have chronic intermittent painless rectal bleeding with bowel movements.  This was thought due to internal hemorrhoids.  Also reported black stool for 3 weeks after starting an iron supplement.  There is no evidence of upper GI bleeding.  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  At that time recommended trending hemoglobin hematocrit discussed a colonoscopy at some point.  It was thought the black stool was due to iron but started on empiric PPI.  Discussed a hemoglobin climbing EGD.  During the hospitalization hemoglobin 13.1 FOBT positive.    01/02/2024 IR service signed off.  Hemoglobin remained stable.  At that time recommended that he stop the iron containing supplement to figure out if this was causing black stools.  Discussed possible EGD if continued with black stools and a colonoscopy.    01/12/2024 patient admitted to the ED for altered mental status and urine frequency.  Labs with a potassium 3.4, CBC normal.  Patient discharged with likely UTI.    Today, patient presents to clinic accompanied by his wife and explains that he has had some bright red blood with bowel movements off-and-on for years, which they have always thought were hemorrhoids.  Most recently a couple of days ago this was  quite a bit of blood and even seem to drip on his way to the toilet.  His wife tells me he saw over the floor.  This happened 1 time though and then they have not seen any since.  Patient describes his normal bowel movements as soft solid, no straining.  They do think the bleeding has gotten more frequent.  He keeps his stools normal with applesauce and emu oil per his wife.  They would really like him to have a colonoscopy.    Discussed black stools which were explained during the hospitalization.  Patient has stopped iron supplement and these are back to normal.  Patient's wife also tells me whenever he drinks a certain drink to lower his blood pressure stools are also black, but this is due to that and then they go back to normal.    Patient does use a walker to ambulate but can take a few steps with just a hand support.  He thinks he can transfer to the bed.    Also wife discuss a question about a reaction to anesthesia he had during his last procedure which they think was to put in a Foley.  Apparently took him a few days after to come back around.    Denies fever, chills or weight loss.  Past Medical History:  Diagnosis Date   Acute respiratory failure with hypoxia (HCC) 08/16/2021   Aspiration pneumonia (HCC) 08/15/2021   CKD (chronic kidney disease) stage 3, GFR 30-59 ml/min (HCC)    COVID-19 virus infection 06/06/2021   COVID-19 Labs  No results for input(s): DDIMER, FERRITIN, LDH, CRP in the last 72 hours.             Lab Results      Component    Value    Date           SARSCOV2NAA    POSITIVE (A)    06/06/2021           SARSCOV2NAA    NEGATIVE    04/12/2021           SARSCOV2NAA    NEGATIVE    04/01/2021           SARSCOV2NAA    NEGATIVE    03/11/2021           Hypertension    Kidney stones    Severe sepsis (HCC) 08/16/2021   Sleep apnea    Subdural hematoma (HCC) 06/06/2021    Past Surgical History:  Procedure Laterality Date   BLADDER STONE REMOVAL  12/2021   CATARACT  EXTRACTION     IR NEPHROSTOMY EXCHANGE RIGHT  06/07/2021   IR NEPHROSTOMY EXCHANGE RIGHT  08/22/2021   IR NEPHROSTOMY PLACEMENT RIGHT  04/07/2021   IR PATIENT EVAL TECH 0-60 MINS  06/02/2021   kidney stent     MANDIBLE FRACTURE SURGERY     NEPHRECTOMY Right 01/07/2022    Current Outpatient Medications  Medication Sig Dispense Refill   amLODipine  (NORVASC ) 10 MG tablet Take 10 mg by mouth every evening.     GLUTATHIONE PO Take 1 Dose by mouth daily. Both OTC Glutathione drink and oral spray.     OVER THE COUNTER MEDICATION Take 1 capsule by mouth daily. OTC Emu Oil capsules     OVER THE COUNTER MEDICATION Take 1 Dose by mouth daily. Total-B tincture     OVER THE COUNTER MEDICATION Take 1 Capful by mouth daily. Kidney Complete liquid     OVER THE COUNTER MEDICATION Take 1 Bottle by mouth daily. 120 Life, liquid supplement     OVER THE COUNTER MEDICATION Take 1 tablet by mouth daily. OTC bone/muscle supplement     oxybutynin  (DITROPAN ) 5 MG tablet Take 1 tablet (5 mg total) by mouth every 8 (eight) hours as needed for bladder spasms. 30 tablet 0   saccharomyces boulardii (FLORASTOR) 250 MG capsule Take 1 capsule (250 mg total) by mouth 2 (two) times daily. 60 capsule 0   No current facility-administered medications for this visit.    Allergies as of 02/14/2024 - Review Complete 01/16/2024  Allergen Reaction Noted   Statins Diarrhea and Other (See Comments) 04/02/2021   Other Other (See Comments) 01/01/2024   Tape Rash 12/20/2021    Family History  Problem Relation Age of Onset   Hypertension Mother    Hypertension Other     Social History   Socioeconomic History   Marital status: Married    Spouse name: Not on file   Number of children: Not on file   Years of education: Not on file   Highest education level: Not on file  Occupational History   Not on file  Tobacco Use   Smoking status: Never   Smokeless tobacco: Never  Vaping Use   Vaping status: Never Used   Substance and Sexual Activity   Alcohol  use: Yes    Comment: Socially; twice a week   Drug use: Not on file   Sexual activity: Not on file  Other Topics Concern   Not on file  Social History Narrative  Not on file   Social Drivers of Health   Financial Resource Strain: Not on file  Food Insecurity: No Food Insecurity (01/01/2024)   Hunger Vital Sign    Worried About Running Out of Food in the Last Year: Never true    Ran Out of Food in the Last Year: Never true  Transportation Needs: No Transportation Needs (01/01/2024)   PRAPARE - Administrator, Civil Service (Medical): No    Lack of Transportation (Non-Medical): No  Physical Activity: Not on file  Stress: Not on file  Social Connections: Socially Integrated (01/01/2024)   Social Connection and Isolation Panel    Frequency of Communication with Friends and Family: Three times a week    Frequency of Social Gatherings with Friends and Family: Once a week    Attends Religious Services: 1 to 4 times per year    Active Member of [employer] or Organizations: Yes    Attends Banker Meetings: Never    Marital Status: Married  Catering manager Violence: Not At Risk (01/01/2024)   Humiliation, Afraid, Rape, and Kick questionnaire    Fear of Current or Ex-Partner: No    Emotionally Abused: No    Physically Abused: No    Sexually Abused: No    Review of Systems:    Constitutional: No weight loss, fever or chills Skin: No rash  Cardiovascular: No chest pain Respiratory: No SOB Gastrointestinal: See HPI and otherwise negative Genitourinary: No dysuria Neurological: No headache, dizziness or syncope Musculoskeletal: No new muscle or joint pain Hematologic: No bruising Psychiatric: No history of depression or anxiety   Physical Exam:  Vital signs: BP (!) 140/90   Pulse 74   Ht 5' 8 (1.727 m)   Wt 202 lb (91.6 kg)   BMI 30.71 kg/m   Constitutional:   Pleasant elderly Caucasian male appears to be in NAD, Well  developed, Well nourished, alert and cooperative Head:  Normocephalic and atraumatic. Eyes:   PEERL, EOMI. No icterus. Conjunctiva pink. Ears:  Normal auditory acuity. Neck:  Supple Throat: Oral cavity and pharynx without inflammation, swelling or lesion.  Respiratory: Respirations even and unlabored. Lungs clear to auscultation bilaterally.   No wheezes, crackles, or rhonchi.  Cardiovascular: Normal S1, S2. No MRG. Regular rate and rhythm. No peripheral edema, cyanosis or pallor.  Gastrointestinal:  Soft, nondistended, nontender. No rebound or guarding. Normal bowel sounds. No appreciable masses or hepatomegaly. Rectal:  Not performed.  Msk:  Symmetrical without gross deformities. Without edema, no deformity or joint abnormality. +ambulates with a walker Neurologic:  Alert and  oriented x4;  grossly normal neurologically.  Skin:   Dry and intact without significant lesions or rashes. Psychiatric: Oriented to person, place and time. Demonstrates good judgement and reason without abnormal affect or behaviors.  RELEVANT LABS AND IMAGING: CBC    Component Value Date/Time   WBC 10.2 01/16/2024 1211   RBC 4.71 01/16/2024 1211   HGB 13.4 01/16/2024 1211   HCT 41.6 01/16/2024 1211   PLT 189 01/16/2024 1211   MCV 88.3 01/16/2024 1211   MCH 28.5 01/16/2024 1211   MCHC 32.2 01/16/2024 1211   RDW 14.4 01/16/2024 1211   LYMPHSABS 1.7 01/16/2024 1211   MONOABS 0.7 01/16/2024 1211   EOSABS 0.4 01/16/2024 1211   BASOSABS 0.0 01/16/2024 1211    CMP     Component Value Date/Time   NA 141 01/16/2024 1211   K 3.4 (L) 01/16/2024 1211   CL 103 01/16/2024 1211  CO2 26 01/16/2024 1211   GLUCOSE 108 (H) 01/16/2024 1211   BUN 22 01/16/2024 1211   CREATININE 2.01 (H) 01/16/2024 1211   CALCIUM  9.9 01/16/2024 1211   PROT 6.5 01/01/2024 1448   ALBUMIN 3.5 01/01/2024 1448   AST 14 (L) 01/01/2024 1448   ALT 14 01/01/2024 1448   ALKPHOS 46 01/01/2024 1448   BILITOT 0.8 01/01/2024 1448   GFRNONAA  34 (L) 01/16/2024 1211    Assessment: 1.  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  Debilitation: Patient uses a walker to ambulate 3.  Jehovah's Witness 4.  CKD stage III 5.  Black stools: Discussed during recent hospitalization, patient denies any further black stools as he stopped iron supplementation; likely due to iron 6.  History of stroke: Currently not on any blood thinners  Plan: 1.  Repeat labs today including CBC, CMP and iron studies. 2.  Patient's wife is very concerned about the risk for bleeding and wants it known that the patient is a Jehovah's Witness and cannot receive any blood products. 3.  Patient's wife also concerned given that he had a reaction from his last anesthetic and that it took him a few days to kind of come back to himself, it was almost like he had a stroke.  I will contact our anesthesiologist here about this so he can review the chart further and call them to discuss. 4.  Due to the concerns above, colonoscopy was not scheduled during patient's visit today.  Will wait until Norleen Schillings has time to talk to the patient's wife and then hopefully we can get this scheduled in the [facility] with Dr. Legrand. 5.  They do want to proceed with a colonoscopy for further evaluation of ongoing hematochezia, though it appears his blood counts have not changed much, again checking labs today.  Likely this is due to hemorrhoids. 6.  Patient to follow in clinic per recommendations after procedure above.  Nicholas Failing, PA-C [facility] 02/14/2024, 8:32 AM  Cc: Vernon Velna SAUNDERS, MD

## 2024-02-14 NOTE — Patient Instructions (Signed)
 Your provider has requested that you go to the basement level for lab work before leaving today. Press B on the elevator. The lab is located at the first door on the left as you exit the elevator.  You have been scheduled for a colonoscopy. Please follow written instructions given to you at your visit today.   If you use inhalers (even only as needed), please bring them with you on the day of your procedure.  DO NOT TAKE 7 DAYS PRIOR TO TEST- Trulicity (dulaglutide) Ozempic, Wegovy (semaglutide) Mounjaro (tirzepatide) Bydureon Bcise (exanatide extended release)  DO NOT TAKE 1 DAY PRIOR TO YOUR TEST Rybelsus (semaglutide) Adlyxin (lixisenatide) Victoza (liraglutide) Byetta (exanatide) __________________________________________________________________  _______________________________________________________  If your blood pressure at your visit was 140/90 or greater, please contact your primary care physician to follow up on this.  _______________________________________________________  If you are age 64 or older, your body mass index should be between 23-30. Your Body mass index is 30.71 kg/m. If this is out of the aforementioned range listed, please consider follow up with your Primary Care Provider.  If you are age 71 or younger, your body mass index should be between 19-25. Your Body mass index is 30.71 kg/m. If this is out of the aformentioned range listed, please consider follow up with your Primary Care Provider.   ________________________________________________________  The [facility] providers would like to encourage you to use MYCHART to communicate with providers for non-urgent requests or questions.  Due to long hold times on the telephone, sending your provider a message by [facility] may be a faster and more efficient way to get a response.  Please allow 48 business hours for a response.  Please remember that this is for non-urgent requests.   _______________________________________________________  [facility] is using a team-based approach to care.  Your team is made up of your doctor and two to three APPS. Our APPS (Nurse Practitioners and Physician Assistants) work with your physician to ensure care continuity for you. They are fully qualified to address your health concerns and develop a treatment plan. They communicate directly with your gastroenterologist to care for you. Seeing the Advanced Practice Practitioners on your physician's team can help you by facilitating care more promptly, often allowing for earlier appointments, access to diagnostic testing, procedures, and other specialty referrals.

## 2024-02-18 DIAGNOSIS — G4733 Obstructive sleep apnea (adult) (pediatric): Secondary | ICD-10-CM | POA: Diagnosis not present

## 2024-02-18 DIAGNOSIS — R339 Retention of urine, unspecified: Secondary | ICD-10-CM | POA: Diagnosis not present

## 2024-02-18 DIAGNOSIS — N179 Acute kidney failure, unspecified: Secondary | ICD-10-CM | POA: Diagnosis not present

## 2024-02-18 DIAGNOSIS — N1832 Chronic kidney disease, stage 3b: Secondary | ICD-10-CM | POA: Diagnosis not present

## 2024-02-18 DIAGNOSIS — Z87442 Personal history of urinary calculi: Secondary | ICD-10-CM | POA: Diagnosis not present

## 2024-02-18 DIAGNOSIS — N390 Urinary tract infection, site not specified: Secondary | ICD-10-CM | POA: Diagnosis not present

## 2024-02-18 DIAGNOSIS — I7121 Aneurysm of the ascending aorta, without rupture: Secondary | ICD-10-CM | POA: Diagnosis not present

## 2024-02-18 DIAGNOSIS — Z905 Acquired absence of kidney: Secondary | ICD-10-CM | POA: Diagnosis not present

## 2024-02-18 DIAGNOSIS — D631 Anemia in chronic kidney disease: Secondary | ICD-10-CM | POA: Diagnosis not present

## 2024-02-18 DIAGNOSIS — Z9181 History of falling: Secondary | ICD-10-CM | POA: Diagnosis not present

## 2024-02-18 DIAGNOSIS — N3289 Other specified disorders of bladder: Secondary | ICD-10-CM | POA: Diagnosis not present

## 2024-02-18 DIAGNOSIS — I69254 Hemiplegia and hemiparesis following other nontraumatic intracranial hemorrhage affecting left non-dominant side: Secondary | ICD-10-CM | POA: Diagnosis not present

## 2024-02-18 DIAGNOSIS — I69218 Other symptoms and signs involving cognitive functions following other nontraumatic intracranial hemorrhage: Secondary | ICD-10-CM | POA: Diagnosis not present

## 2024-02-18 NOTE — Progress Notes (Signed)
 ____________________________________________________________  Attending physician addendum:  Thank you for sending this case to me. I have reviewed the entire note and agree with the plan.  Delon, your note indicates that the procedures were not scheduled pending a conversation between Norleen Schillings and the patient's wife regarding some concerns she had about anesthesia.  However, he is on my endoscopy schedule for 03/26/2024. John, have you been in touch with this patient's wife to address their concerns?  Victory Brand, MD  ____________________________________________________________

## 2024-02-21 DIAGNOSIS — Z936 Other artificial openings of urinary tract status: Secondary | ICD-10-CM | POA: Diagnosis not present

## 2024-02-21 DIAGNOSIS — Z87440 Personal history of urinary (tract) infections: Secondary | ICD-10-CM | POA: Diagnosis not present

## 2024-02-21 DIAGNOSIS — N135 Crossing vessel and stricture of ureter without hydronephrosis: Secondary | ICD-10-CM | POA: Diagnosis not present

## 2024-02-21 DIAGNOSIS — N35912 Unspecified bulbous urethral stricture, male: Secondary | ICD-10-CM | POA: Diagnosis not present

## 2024-02-21 DIAGNOSIS — N3289 Other specified disorders of bladder: Secondary | ICD-10-CM | POA: Diagnosis not present

## 2024-02-21 DIAGNOSIS — Z87448 Personal history of other diseases of urinary system: Secondary | ICD-10-CM | POA: Diagnosis not present

## 2024-02-25 DIAGNOSIS — N1832 Chronic kidney disease, stage 3b: Secondary | ICD-10-CM | POA: Diagnosis not present

## 2024-02-25 DIAGNOSIS — Z87442 Personal history of urinary calculi: Secondary | ICD-10-CM | POA: Diagnosis not present

## 2024-02-25 DIAGNOSIS — N390 Urinary tract infection, site not specified: Secondary | ICD-10-CM | POA: Diagnosis not present

## 2024-02-25 DIAGNOSIS — I69254 Hemiplegia and hemiparesis following other nontraumatic intracranial hemorrhage affecting left non-dominant side: Secondary | ICD-10-CM | POA: Diagnosis not present

## 2024-02-25 DIAGNOSIS — I7121 Aneurysm of the ascending aorta, without rupture: Secondary | ICD-10-CM | POA: Diagnosis not present

## 2024-02-25 DIAGNOSIS — I129 Hypertensive chronic kidney disease with stage 1 through stage 4 chronic kidney disease, or unspecified chronic kidney disease: Secondary | ICD-10-CM | POA: Diagnosis not present

## 2024-02-25 DIAGNOSIS — D631 Anemia in chronic kidney disease: Secondary | ICD-10-CM | POA: Diagnosis not present

## 2024-02-25 DIAGNOSIS — Z905 Acquired absence of kidney: Secondary | ICD-10-CM | POA: Diagnosis not present

## 2024-02-25 DIAGNOSIS — N179 Acute kidney failure, unspecified: Secondary | ICD-10-CM | POA: Diagnosis not present

## 2024-02-25 DIAGNOSIS — Z9181 History of falling: Secondary | ICD-10-CM | POA: Diagnosis not present

## 2024-02-25 DIAGNOSIS — G4733 Obstructive sleep apnea (adult) (pediatric): Secondary | ICD-10-CM | POA: Diagnosis not present

## 2024-02-25 DIAGNOSIS — R339 Retention of urine, unspecified: Secondary | ICD-10-CM | POA: Diagnosis not present

## 2024-02-25 DIAGNOSIS — I69218 Other symptoms and signs involving cognitive functions following other nontraumatic intracranial hemorrhage: Secondary | ICD-10-CM | POA: Diagnosis not present

## 2024-02-25 DIAGNOSIS — N3289 Other specified disorders of bladder: Secondary | ICD-10-CM | POA: Diagnosis not present

## 2024-03-03 DIAGNOSIS — D631 Anemia in chronic kidney disease: Secondary | ICD-10-CM | POA: Diagnosis not present

## 2024-03-03 DIAGNOSIS — G4733 Obstructive sleep apnea (adult) (pediatric): Secondary | ICD-10-CM | POA: Diagnosis not present

## 2024-03-03 DIAGNOSIS — N1832 Chronic kidney disease, stage 3b: Secondary | ICD-10-CM | POA: Diagnosis not present

## 2024-03-03 DIAGNOSIS — Z87442 Personal history of urinary calculi: Secondary | ICD-10-CM | POA: Diagnosis not present

## 2024-03-03 DIAGNOSIS — I69218 Other symptoms and signs involving cognitive functions following other nontraumatic intracranial hemorrhage: Secondary | ICD-10-CM | POA: Diagnosis not present

## 2024-03-03 DIAGNOSIS — I7121 Aneurysm of the ascending aorta, without rupture: Secondary | ICD-10-CM | POA: Diagnosis not present

## 2024-03-03 DIAGNOSIS — N390 Urinary tract infection, site not specified: Secondary | ICD-10-CM | POA: Diagnosis not present

## 2024-03-03 DIAGNOSIS — Z9181 History of falling: Secondary | ICD-10-CM | POA: Diagnosis not present

## 2024-03-03 DIAGNOSIS — I129 Hypertensive chronic kidney disease with stage 1 through stage 4 chronic kidney disease, or unspecified chronic kidney disease: Secondary | ICD-10-CM | POA: Diagnosis not present

## 2024-03-03 DIAGNOSIS — N3289 Other specified disorders of bladder: Secondary | ICD-10-CM | POA: Diagnosis not present

## 2024-03-03 DIAGNOSIS — N179 Acute kidney failure, unspecified: Secondary | ICD-10-CM | POA: Diagnosis not present

## 2024-03-03 DIAGNOSIS — I69254 Hemiplegia and hemiparesis following other nontraumatic intracranial hemorrhage affecting left non-dominant side: Secondary | ICD-10-CM | POA: Diagnosis not present

## 2024-03-03 DIAGNOSIS — R339 Retention of urine, unspecified: Secondary | ICD-10-CM | POA: Diagnosis not present

## 2024-03-03 DIAGNOSIS — Z905 Acquired absence of kidney: Secondary | ICD-10-CM | POA: Diagnosis not present

## 2024-03-10 DIAGNOSIS — I77810 Thoracic aortic ectasia: Secondary | ICD-10-CM | POA: Diagnosis not present

## 2024-03-10 DIAGNOSIS — N1831 Chronic kidney disease, stage 3a: Secondary | ICD-10-CM | POA: Diagnosis not present

## 2024-03-10 DIAGNOSIS — N3289 Other specified disorders of bladder: Secondary | ICD-10-CM | POA: Diagnosis not present

## 2024-03-10 DIAGNOSIS — E876 Hypokalemia: Secondary | ICD-10-CM | POA: Diagnosis not present

## 2024-03-10 DIAGNOSIS — R7303 Prediabetes: Secondary | ICD-10-CM | POA: Diagnosis not present

## 2024-03-10 DIAGNOSIS — H6122 Impacted cerumen, left ear: Secondary | ICD-10-CM | POA: Diagnosis not present

## 2024-03-10 DIAGNOSIS — I10 Essential (primary) hypertension: Secondary | ICD-10-CM | POA: Diagnosis not present

## 2024-03-10 DIAGNOSIS — K922 Gastrointestinal hemorrhage, unspecified: Secondary | ICD-10-CM | POA: Diagnosis not present

## 2024-03-10 DIAGNOSIS — Z0000 Encounter for general adult medical examination without abnormal findings: Secondary | ICD-10-CM | POA: Diagnosis not present

## 2024-03-10 DIAGNOSIS — G4733 Obstructive sleep apnea (adult) (pediatric): Secondary | ICD-10-CM | POA: Diagnosis not present

## 2024-03-10 DIAGNOSIS — I69354 Hemiplegia and hemiparesis following cerebral infarction affecting left non-dominant side: Secondary | ICD-10-CM | POA: Diagnosis not present

## 2024-03-16 ENCOUNTER — Emergency Department (HOSPITAL_BASED_OUTPATIENT_CLINIC_OR_DEPARTMENT_OTHER)
Admission: EM | Admit: 2024-03-16 | Discharge: 2024-03-16 | Disposition: A | Discharge status: Home / Self Care | Attending: Emergency Medicine | Admitting: Emergency Medicine

## 2024-03-16 ENCOUNTER — Encounter (HOSPITAL_BASED_OUTPATIENT_CLINIC_OR_DEPARTMENT_OTHER): Payer: Self-pay

## 2024-03-16 ENCOUNTER — Other Ambulatory Visit: Payer: Self-pay

## 2024-03-16 DIAGNOSIS — B029 Zoster without complications: Secondary | ICD-10-CM | POA: Insufficient documentation

## 2024-03-16 DIAGNOSIS — N1832 Chronic kidney disease, stage 3b: Secondary | ICD-10-CM | POA: Insufficient documentation

## 2024-03-16 DIAGNOSIS — R21 Rash and other nonspecific skin eruption: Secondary | ICD-10-CM | POA: Diagnosis present

## 2024-03-16 DIAGNOSIS — I129 Hypertensive chronic kidney disease with stage 1 through stage 4 chronic kidney disease, or unspecified chronic kidney disease: Secondary | ICD-10-CM | POA: Diagnosis not present

## 2024-03-16 DIAGNOSIS — Z79899 Other long term (current) drug therapy: Secondary | ICD-10-CM | POA: Insufficient documentation

## 2024-03-16 MED ORDER — ACYCLOVIR 200 MG PO CAPS
800.0000 mg | ORAL_CAPSULE | Freq: Once | ORAL | Status: AC
  Administered 2024-03-16: 800 mg via ORAL
  Filled 2024-03-16: qty 4

## 2024-03-16 MED ORDER — ACYCLOVIR 800 MG PO TABS: 800.0000 mg | ORAL_TABLET | Freq: Every day | ORAL | 0 refills | Status: AC

## 2024-03-16 MED ORDER — LIDOCAINE 5 % EX PTCH: 1.0000 | MEDICATED_PATCH | CUTANEOUS | 0 refills | Status: AC

## 2024-03-16 MED ORDER — LIDOCAINE 5 % EX PTCH
1.0000 | MEDICATED_PATCH | CUTANEOUS | Status: DC
  Administered 2024-03-16: 1 via TRANSDERMAL
  Filled 2024-03-16: qty 1

## 2024-03-16 NOTE — ED Provider Notes (Signed)
 [place] EMERGENCY DEPARTMENT AT [facility] Provider Note  CSN: [id number] Arrival date & time: 03/16/24 1231  Chief Complaint(s) Rash  HPI Nicholas Mckenzie is a 78 y.o. male with a past medical history listed below here for rash on the right flank.  Onset 2 days ago.  Burning pain.  No falls or trauma.  No fevers or chills.  The location was concerning to family as this was the insertion site for his nephrostomy tube.  The nephrostomy tube has been removed for couple of years.   Rash   Past Medical History Past Medical History:  Diagnosis Date   Acute respiratory failure with hypoxia (HCC) 08/16/2021   Aspiration pneumonia (HCC) 08/15/2021   CKD (chronic kidney disease) stage 3, GFR 30-59 ml/min (HCC)    COVID-19 virus infection 06/06/2021   COVID-19 Labs     No results for input(s): DDIMER, FERRITIN, LDH, CRP in the last 72 hours.             Lab Results      Component    Value    Date           SARSCOV2NAA    POSITIVE (A)    06/06/2021           SARSCOV2NAA    NEGATIVE    04/12/2021           SARSCOV2NAA    NEGATIVE    04/01/2021           SARSCOV2NAA    NEGATIVE    03/11/2021           Hypertension    Kidney stones    Severe sepsis (HCC) 08/16/2021   Sleep apnea    Subdural hematoma (HCC) 06/06/2021   Patient Active Problem List   Diagnosis Date Noted   Rectal bleeding 01/02/2024   GI bleed 01/01/2024   Acute kidney injury superimposed on stage 3b chronic kidney disease (HCC) 01/01/2024   Transfusion of blood product refused for religious reason 09/03/2022   Hx of subdural hematoma 08/16/2021   Nephrolithiasis 08/16/2021   Obesity (BMI 30-39.9) 08/16/2021   OSA (obstructive sleep apnea) 08/16/2021   Frequent urination 06/07/2021   UTI (urinary tract infection) 06/07/2021   Ascending aortic aneurysm 06/07/2021   Atrophy of right kidney 06/07/2021   Anemia 06/07/2021   H/O ischemic right PCA stroke 06/06/2021   History of cardioembolic cerebrovascular accident  (CVA) 06/06/2021   Right-sided cerebrovascular accident (CVA) (HCC) 04/14/2021   Nonfunctioning kidney 04/14/2021   Goals of care, counseling/discussion 04/03/2021   Ureteral stent occlusion 04/02/2021   Stage 3b chronic kidney disease (HCC) 03/11/2021   Major depressive disorder, recurrent episode, 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 Authorizing Provider  acyclovir  (ZOVIRAX ) 800 MG tablet Take 1 tablet (800 mg total) by mouth 5 (five) times daily for 7 days. 03/16/24 03/23/24 Yes Krystyl Cannell, Raynell Moder, MD  lidocaine  (LIDODERM ) 5 % Place 1 patch onto the skin daily. Remove & Discard patch within 12 hours or as directed by MD 03/16/24  Yes Magdiel Bartles, Raynell Moder, MD  amLODipine  (NORVASC ) 10 MG tablet Take 10 mg by mouth every evening.    [provider]  GLUTATHIONE PO Take 1 Dose by mouth daily. Both OTC Glutathione drink and oral spray.    [provider]  OVER THE COUNTER MEDICATION Take  1 capsule by mouth daily. OTC Emu Oil capsules    [provider]  OVER THE COUNTER MEDICATION Take 1 Dose by mouth daily. Total-B tincture    [provider]  OVER THE COUNTER MEDICATION Take 1 Capful by mouth daily. Kidney Complete liquid    [provider]  OVER THE COUNTER MEDICATION Take 1 Bottle by mouth daily. 120 Life, liquid supplement    [provider]  OVER THE COUNTER MEDICATION Take 1 tablet by mouth daily. OTC bone/muscle supplement    [provider]  oxybutynin  (DITROPAN ) 5 MG tablet Take 1 tablet (5 mg total) by mouth every 8 (eight) hours as needed for bladder spasms. 01/03/24   Jillian Buttery, MD  saccharomyces boulardii (FLORASTOR) 250 MG capsule Take 1 capsule (250 mg total) by mouth 2 (two) times daily. 01/03/24   Jillian Buttery, MD                                                                                                                                     Allergies Statins, Other, and Tape  Review of Systems Review of Systems  Skin:  Positive for rash.   As noted in HPI  Physical Exam Vital Signs  I have reviewed the triage vital signs BP (!) 166/116 (BP Location: Left Arm)   Pulse 61   Temp (!) 97 F (36.1 C) (Temporal)   Resp 20   Ht 5' 8 (1.727 m)   Wt 91.6 kg   SpO2 100%   BMI 30.71 kg/m   Physical Exam Vitals reviewed.  Constitutional:      General: He is not in acute distress.    Appearance: He is well-developed. He is not diaphoretic.  HENT:     Head: Normocephalic and atraumatic.     Right Ear: External ear normal.     Left Ear: External ear normal.     Nose: Nose normal.     Mouth/Throat:     Mouth: Mucous membranes are moist.  Eyes:     General: No scleral icterus.    Conjunctiva/sclera: Conjunctivae normal.  Neck:     Trachea: Phonation normal.  Cardiovascular:     Rate and Rhythm: Normal rate and regular rhythm.  Pulmonary:     Effort: Pulmonary effort is normal. No respiratory distress.     Breath sounds: No stridor.  Abdominal:     General: There is no distension.  Musculoskeletal:        General: Normal range of motion.     Cervical back: Normal range of motion.  Skin:    Findings: Rash (to right flank) present. No abscess. Rash is vesicular.     Comments: See image  Neurological:     Mental Status: He is alert and oriented to person, place, and time.  Psychiatric:        Behavior: Behavior normal.     ED Results and Treatments  Labs (all labs ordered are listed, but only abnormal results are displayed) Labs Reviewed - No data to display                                                                                                                       EKG  EKG Interpretation Date/Time:    Ventricular Rate:    PR Interval:    QRS Duration:    QT Interval:     QTC Calculation:   R Axis:      Text Interpretation:         Radiology No results found.  Medications Ordered in ED Medications  lidocaine  (LIDODERM ) 5 % 1 patch (1 patch Transdermal Patch Applied 03/16/24 1459)  acyclovir  (ZOVIRAX ) 200 MG capsule 800 mg (800 mg Oral Given 03/16/24 1459)   Procedures Procedures  (including critical care time) Medical Decision Making / ED Course   Medical Decision Making   Vesicular rash and dermatomal distribution.  Mo suspicious for shingles.  Doubt deep tissue infection as no induration appreciated.  No overlying cellulitis.  Patient's recent kidney function with a GFR greater than 39.  Will treat with acyclovir .  Lidoderm  patch for pain control.    Final Clinical Impression(s) / ED Diagnoses Final diagnoses:  Herpes zoster without complication   The patient appears reasonably screened and/or stabilized for discharge and I doubt any other medical condition or other [facility] requiring further screening, evaluation, or treatment in the ED at this time. I have discussed the findings, Dx and Tx plan with the patient/family who expressed understanding and agree(s) with the plan. Discharge instructions discussed at length. The patient/family was given strict return precautions who verbalized understanding of the instructions. No further questions at time of discharge.  Disposition: Discharge  Condition: Good  ED Discharge Orders          Ordered    acyclovir  (ZOVIRAX ) 800 MG tablet  5 times daily        03/16/24 1518    lidocaine  (LIDODERM ) 5 %  Every 24 hours        03/16/24 1518              Follow Up: Vernon Velna SAUNDERS, MD [address] [place] [address] [phone]  Call  to schedule an appointment for close follow up    This chart was dictated using voice recognition software.  Despite best efforts to proofread,  errors can occur which can change the documentation meaning.    Trine Raynell Moder,  MD 03/16/24 [phone]

## 2024-03-16 NOTE — ED Triage Notes (Signed)
 Patient arrives POV with complaints of a new rash x3 days. Rash is around a surgical site where he had his right kidney removed one year ago.

## 2024-03-26 ENCOUNTER — Encounter: Admitting: Gastroenterology

## 2024-04-14 DIAGNOSIS — N1832 Chronic kidney disease, stage 3b: Secondary | ICD-10-CM | POA: Diagnosis not present

## 2024-06-10 ENCOUNTER — Ambulatory Visit: Attending: Internal Medicine | Admitting: Physical Therapy

## 2024-06-10 ENCOUNTER — Encounter: Payer: Self-pay | Admitting: Physical Therapy

## 2024-06-10 ENCOUNTER — Other Ambulatory Visit: Payer: Self-pay

## 2024-06-10 DIAGNOSIS — R2681 Unsteadiness on feet: Secondary | ICD-10-CM | POA: Diagnosis present

## 2024-06-10 DIAGNOSIS — M6281 Muscle weakness (generalized): Secondary | ICD-10-CM | POA: Diagnosis present

## 2024-06-10 DIAGNOSIS — R2689 Other abnormalities of gait and mobility: Secondary | ICD-10-CM | POA: Insufficient documentation

## 2024-06-10 DIAGNOSIS — R293 Abnormal posture: Secondary | ICD-10-CM | POA: Insufficient documentation

## 2024-06-10 NOTE — Therapy (Signed)
 OUTPATIENT PHYSICAL THERAPY NEURO EVALUATION   Patient Name: Nicholas Mckenzie MRN: [id number] DOB:[birth year], 78 y.o., male Today's Date: 06/10/2024   PCP: Vernon Velna SAUNDERS, MD  REFERRING PROVIDER: Vernon Velna SAUNDERS, MD   END OF SESSION:  PT End of Session - 06/10/24 1227     Visit Number 1    Number of Visits 17    Date for Recertification  08/07/24    Authorization Type [employer] Medicare-auth submitted at eval    Progress Note Due on Visit 10    PT Start Time 0850    PT Stop Time 0930    PT Time Calculation (min) 40 min    Equipment Utilized During Treatment Gait belt    Activity Tolerance Patient tolerated treatment well    Behavior During Therapy WFL for tasks assessed/performed          Past Medical History:  Diagnosis Date   Acute respiratory failure with hypoxia (HCC) 08/16/2021   Aspiration pneumonia (HCC) 08/15/2021   CKD (chronic kidney disease) stage 3, GFR 30-59 ml/min (HCC)    COVID-19 virus infection 06/06/2021   COVID-19 Labs     No results for input(s): DDIMER, FERRITIN, LDH, CRP in the last 72 hours.             Lab Results      Component    Value    Date           SARSCOV2NAA    POSITIVE (A)    06/06/2021           SARSCOV2NAA    NEGATIVE    04/12/2021           SARSCOV2NAA    NEGATIVE    04/01/2021           SARSCOV2NAA    NEGATIVE    03/11/2021           Hypertension    Kidney stones    Severe sepsis (HCC) 08/16/2021   Sleep apnea    Subdural hematoma (HCC) 06/06/2021   Past Surgical History:  Procedure Laterality Date   BLADDER STONE REMOVAL  12/2021   CATARACT EXTRACTION     IR NEPHROSTOMY EXCHANGE RIGHT  06/07/2021   IR NEPHROSTOMY EXCHANGE RIGHT  08/22/2021   IR NEPHROSTOMY PLACEMENT RIGHT  04/07/2021   IR PATIENT EVAL TECH 0-60 MINS  06/02/2021   kidney stent     MANDIBLE FRACTURE SURGERY     NEPHRECTOMY Right 01/07/2022   Patient Active Problem List   Diagnosis Date Noted   Rectal bleeding 01/02/2024   GI bleed 01/01/2024   Acute kidney injury  superimposed on stage 3b chronic kidney disease (HCC) 01/01/2024   Transfusion of blood product refused for religious reason 09/03/2022   Hx of subdural hematoma 08/16/2021   Nephrolithiasis 08/16/2021   Obesity (BMI 30-39.9) 08/16/2021   OSA (obstructive sleep apnea) 08/16/2021   Frequent urination 06/07/2021   UTI (urinary tract infection) 06/07/2021   Ascending aortic aneurysm 06/07/2021   Atrophy of right kidney 06/07/2021   Anemia 06/07/2021   H/O ischemic right PCA stroke 06/06/2021   History of cardioembolic cerebrovascular accident (CVA) 06/06/2021   Right-sided cerebrovascular accident (CVA) (HCC) 04/14/2021   Nonfunctioning kidney 04/14/2021   Goals of care, counseling/discussion 04/03/2021   Ureteral stent occlusion 04/02/2021   Stage 3b chronic kidney disease (HCC) 03/11/2021   Major depressive disorder, recurrent episode, in partial remission 03/11/2021   GAD (generalized anxiety disorder) 07/20/2019   Essential (primary) hypertension 05/25/2019  ONSET DATE: 05/28/2024 MD referral  REFERRING DIAG: P30.645 (ICD-10-CM) - Hemiplegia and hemiparesis following cerebral infarction affecting left non-dominant side   THERAPY DIAG:  Muscle weakness (generalized)  Unsteadiness on feet  Other abnormalities of gait and mobility  Abnormal posture  Rationale for Evaluation and Treatment: Rehabilitation  SUBJECTIVE:                                                                                                                                                                                             SUBJECTIVE STATEMENT: Fell in [place]  and injured R RTC (per patient report).  Wife reports pt had a CVA and affected L side.  CVA was 02/2021.  Had some home health immediately after.  Wife reports that his vision and hearing were affected; weakness in knees and tendency to lean forward.  Wife notes more changes in cognition and would like to pursue speech and OT. Wife reports legs  are very weak and she has to give 85% assistance with gait. Pt accompanied by: significant other, spouse, [name]  PERTINENT HISTORY: CVA 2022, hx of subdural hematoma 2023, chronic kidney disease, HTN  PAIN:  Are you having pain? 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 Yes. Number of falls 4  LIVING ENVIRONMENT: Lives with: lives with their spouse Lives in: House/apartment Stairs: ramp Has following equipment at home: Single point cane, Environmental Consultant - 2 wheeled, and Wheelchair (manual)  PLOF: Independent  PATIENT GOALS: Would like to walk with the walker (not behind the wheelchair anymore)  OBJECTIVE:  Note: Objective measures were completed at Evaluation unless otherwise noted.  DIAGNOSTIC FINDINGS: CT head 12/2023:  IMPRESSION: 1. No acute intracranial abnormality, specifically, no acute hemorrhage, territorial infarction, or intracranial mass. 2. Global cerebral volume loss with sequelae of advanced chronic ischemic microvascular disease.  COGNITION: Overall cognitive status: History of cognitive impairments - at baseline and wife reports concern regarding cognition   SENSATION: Light touch: Impaired  Impaired to light touch LLE  MUSCLE TONE: LLE: Within functional limits    POSTURE: rounded shoulders, forward head, posterior pelvic tilt, and flexed trunk   LOWER EXTREMITY ROM:   WFL BLEs  LOWER EXTREMITY MMT:    MMT Right Eval Left Eval  Hip flexion 3+ 3+  Hip extension    Hip abduction 4 4  Hip adduction 4 4  Hip internal rotation    Hip external rotation    Knee flexion 3+ 3+  Knee extension 3+ 3+  Ankle dorsiflexion 3+ 3  Ankle plantarflexion    Ankle inversion    Ankle eversion    (Blank rows = not tested)    TRANSFERS: Sit to stand: CGA and tends to use hands at walker   Assistive device utilized: Environmental Consultant - 2 wheeled     Stand to sit: CGA  Assistive device utilized: Environmental Consultant - 2 wheeled      GAIT: Findings: Gait Characteristics: pt steps through, but stops to move walker, step through pattern, decreased step length- Right, decreased step length- Left, trunk flexed, poor foot clearance- Right, and poor foot clearance- Left, Distance walked: 50 ft, Assistive device utilized:Walker - 2 wheeled, Level of assistance: CGA and Min A, and Comments: tends to look around to other people in gym and stops walking, needs redirection to task several times with testing  FUNCTIONAL TESTS:  5 times sit to stand: 71.84 sec with definite UE support Timed up and go (TUG): 129.53 sec 10 meter walk test: 149.56 sec = 0.22 ft/sec  Needs redirection to task multiple times during eval tasks                                                                                                                              TREATMENT DATE: 06/10/2024    PATIENT EDUCATION: Education details: Eval results, POC, initiated HEP Person educated: Patient and Spouse Education method: Explanation, Demonstration, Verbal cues, and Handouts Education comprehension: verbalized understanding, returned demonstration, and needs further education  HOME EXERCISE PROGRAM: Access Code: [email or web address] URL: [email or web address] Date: 06/10/2024 Prepared by: [facility] - Outpatient  Rehab - [facility]  Exercises - Seated March  - 2 x daily - 7 x weekly - 2 sets - 10 reps - Seated Long Arc Quad  - 2 x daily - 7 x weekly - 2 sets - 10 reps - Seated Heel Toe Raises  - 2 x daily - 7 x weekly - 2 sets - 10 reps  GOALS: Goals reviewed with patient? Yes  SHORT TERM GOALS: Target date: 07/10/2024  Pt will be supervision with HEP for improved strength, balance, transfers, gait. Baseline: Goal status: INITIAL  2.  Pt will improve 5x sit<>stand to less than or equal to 45 sec to demonstrate improved  functional strength and transfer efficiency. Baseline: 71.84 sec Goal status: INITIAL  3.  Pt will improve TUG score to less than or equal to 60 sec for decreased fall risk. Baseline: 129.53 sec Goal status: INITIAL  LONG TERM GOALS: Target date: 08/07/2024  Pt will be supervision with HEP for improved strength, balance, transfers, gait. Baseline:  Goal status: INITIAL  2.  Pt will improve 5x sit<>stand to less than or equal to 20 sec to demonstrate improved functional strength and transfer efficiency. Baseline:  Goal status: INITIAL  3.  Pt will improve TUG score to less than or equal to 30 sec for decreased fall risk. Baseline:  Goal status:  INITIAL  4.  Pt will improve gait velocity to at least 1.8 ft/sec for improved gait efficiency and safety. Baseline: 0.22 ft/sec Goal status: INITIAL  5.  Pt will ambulate at least 500 ft, indoors and outdoors surfaces with RW, supervision, for improved community gait. Baseline:  Goal status: INITIAL  6.  Berg Balance test TBA and to improved to at least 45/56 for decreased risk of falls. Baseline:  Goal status: INITIAL  ASSESSMENT:  CLINICAL IMPRESSION: Patient is a 78 y.o. male who was seen today for physical therapy evaluation and treatment for hemiplegia and hemiparesis with cerebral infarction. Pt had CVA affecting L side in 2022; wife reports more decline recently in mobility, with wife providing all assistance for caregiver, ADL, gait tasks.  Pt has had 4 falls in the past 6 months.  Pt presents with decreased strength, decreased balance, abnormal posture (Decreased vision and hearing, per wife report-not assessed in eval today), decreased independence with gait, decreased safety awareness, decreased sensation LLE.  Pt is at fall risk per TUG, FTSTS, and gait velocity measures; pt moves very slowly and is distracted by external stimuli in therapy environment, needing redirection to task multiple times.  *Of note, wife reports concern  over memory and word finding/cognition as well as ADLs and R shoulder issues/LUE weakness from CVA.  She/pt are in agreement to request OT and speech therapy referrals.*  Pt would benefit from skilled PT to address the above stated deficits to decrease fall risk and improve overall functional mobility and independence.  OBJECTIVE IMPAIRMENTS: Abnormal gait, decreased activity tolerance, decreased balance, decreased cognition, decreased mobility, difficulty walking, decreased strength, decreased safety awareness, and postural dysfunction.   ACTIVITY LIMITATIONS: bending, sitting, standing, transfers, bathing, toileting, dressing, self feeding, reach over head, hygiene/grooming, locomotion level, and caring for others  PARTICIPATION LIMITATIONS: meal prep, cleaning, laundry, medication management, interpersonal relationship, driving, shopping, and community activity  PERSONAL FACTORS: 3+ comorbidities: see above for PMH are also affecting patient's functional outcome.   REHAB POTENTIAL: Good  CLINICAL DECISION MAKING: Evolving/moderate complexity  EVALUATION COMPLEXITY: Moderate  PLAN:  PT FREQUENCY: 2x/week  PT DURATION: 8 weeks plus eval visit  PLANNED INTERVENTIONS: 97750- Physical Performance Testing, 97110-Therapeutic exercises, 97530- Therapeutic activity, [id number]- Neuromuscular re-education, 97535- Self Care, [id number]- Manual therapy, [phone]- Gait training, Patient/Family education, Balance training, and DME instructions  PLAN FOR NEXT SESSION: Review HEP and progress.  Assess Berg; follow up on OT and speech therapy referrals.   STARLET GREIG ORN., PT 06/10/2024, 12:29 PM   [facility] [address] [place], [address] [place], [place] [address] Phone # [phone] Fax # [phone]

## 2024-06-15 ENCOUNTER — Ambulatory Visit: Admitting: Physical Therapy

## 2024-06-15 DIAGNOSIS — R2681 Unsteadiness on feet: Secondary | ICD-10-CM

## 2024-06-15 DIAGNOSIS — R2689 Other abnormalities of gait and mobility: Secondary | ICD-10-CM

## 2024-06-15 DIAGNOSIS — M6281 Muscle weakness (generalized): Secondary | ICD-10-CM

## 2024-06-15 NOTE — Therapy (Signed)
 " OUTPATIENT PHYSICAL THERAPY NEURO TREATMENT NOTE   Patient Name: Nicholas Mckenzie MRN: [id number] DOB:09/18/1945, 78 y.o., male Today's Date: 06/15/2024   PCP: Vernon Velna SAUNDERS, MD  REFERRING PROVIDER: Vernon Velna SAUNDERS, MD   END OF SESSION:  PT End of Session - 06/15/24 0856     Visit Number 2    Number of Visits 17    Date for Recertification  08/07/24    Authorization Type [employer] Medicare-approved 17 PT visits from 06/10/24-08/19/24.    Authorization - Visit Number 2    Authorization - Number of Visits 17    Progress Note Due on Visit 10    PT Start Time [phone]    PT Stop Time 0930    PT Time Calculation (min) 40 min    Equipment Utilized During Treatment Gait belt    Activity Tolerance Patient tolerated treatment well    Behavior During Therapy WFL for tasks assessed/performed           Past Medical History:  Diagnosis Date   Acute respiratory failure with hypoxia (HCC) 08/16/2021   Aspiration pneumonia (HCC) 08/15/2021   CKD (chronic kidney disease) stage 3, GFR 30-59 ml/min (HCC)    COVID-19 virus infection 06/06/2021   COVID-19 Labs     No results for input(s): DDIMER, FERRITIN, LDH, CRP in the last 72 hours.             Lab Results      Component    Value    Date           SARSCOV2NAA    POSITIVE (A)    06/06/2021           SARSCOV2NAA    NEGATIVE    04/12/2021           SARSCOV2NAA    NEGATIVE    04/01/2021           SARSCOV2NAA    NEGATIVE    03/11/2021           Hypertension    Kidney stones    Severe sepsis (HCC) 08/16/2021   Sleep apnea    Subdural hematoma (HCC) 06/06/2021   Past Surgical History:  Procedure Laterality Date   BLADDER STONE REMOVAL  12/2021   CATARACT EXTRACTION     IR NEPHROSTOMY EXCHANGE RIGHT  06/07/2021   IR NEPHROSTOMY EXCHANGE RIGHT  08/22/2021   IR NEPHROSTOMY PLACEMENT RIGHT  04/07/2021   IR PATIENT EVAL TECH 0-60 MINS  06/02/2021   kidney stent     MANDIBLE FRACTURE SURGERY     NEPHRECTOMY Right 01/07/2022   Patient Active Problem  List   Diagnosis Date Noted   Rectal bleeding 01/02/2024   GI bleed 01/01/2024   Acute kidney injury superimposed on stage 3b chronic kidney disease (HCC) 01/01/2024   Transfusion of blood product refused for religious reason 09/03/2022   Hx of subdural hematoma 08/16/2021   Nephrolithiasis 08/16/2021   Obesity (BMI 30-39.9) 08/16/2021   OSA (obstructive sleep apnea) 08/16/2021   Frequent urination 06/07/2021   UTI (urinary tract infection) 06/07/2021   Ascending aortic aneurysm 06/07/2021   Atrophy of right kidney 06/07/2021   Anemia 06/07/2021   H/O ischemic right PCA stroke 06/06/2021   History of cardioembolic cerebrovascular accident (CVA) 06/06/2021   Right-sided cerebrovascular accident (CVA) (HCC) 04/14/2021   Nonfunctioning kidney 04/14/2021   Goals of care, counseling/discussion 04/03/2021   Ureteral stent occlusion 04/02/2021   Stage 3b chronic kidney disease (HCC) 03/11/2021   Major depressive  disorder, recurrent episode, in partial remission 03/11/2021   GAD (generalized anxiety disorder) 07/20/2019   Essential (primary) hypertension 05/25/2019    ONSET DATE: 05/28/2024 MD referral  REFERRING DIAG: P30.645 (ICD-10-CM) - Hemiplegia and hemiparesis following cerebral infarction affecting left non-dominant side   THERAPY DIAG:  Muscle weakness (generalized)  Unsteadiness on feet  Other abnormalities of gait and mobility  Rationale for Evaluation and Treatment: Rehabilitation  SUBJECTIVE:                                                                                                                                                                                             SUBJECTIVE STATEMENT: Had 2 falls. No other changes.  Per wife:  Made an appointment with eye doctor and they may be able to do prisms.  Appt is next week. Pt accompanied by: significant other, spouse, [name]  PERTINENT HISTORY: CVA 2022, hx of subdural hematoma 2023, chronic kidney disease,  HTN  PAIN:  Are you having pain? 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 Yes. Number of falls 4  LIVING ENVIRONMENT: Lives with: lives with their spouse Lives in: House/apartment Stairs: ramp Has following equipment at home: Single point cane, Environmental Consultant - 2 wheeled, and Wheelchair (manual)  PLOF: Independent  PATIENT GOALS: Would like to walk with the walker (not behind the wheelchair anymore)  OBJECTIVE:   TODAY'S TREATMENT: 06/15/2024 Activity Comments  Reviewed HEP and pt return demo with cues   Seated PRE: Seated march x 10 LAQ x 10 Seated hip abduction-step out x 10 Seated hamstring curls-red band 2 x 10  2#  Sit to stand, 2 x 5 reps Consistent cues for hand, foot placement, min guard  Standing at walker, marching in place 2 x 10 reps   Gait with RW, 50 ft x 2 reps Min assist and cues to keep consistent pace-pt often does a step through/stop pattern and looks around clinic, needs cues to redirect to task      Access Code: [email or web address] URL: [email or web address] Date: 06/15/2024 Prepared by: [facility] - Outpatient  Rehab - [facility]  Exercises - Seated Heel Toe Raises  - 2 x daily - 7 x weekly - 2 sets - 10 reps - Seated Hip Flexion March with Ankle Weights  - 1 x daily - 5 x weekly - 3 sets - 10 reps - Seated Long Arc Quad with Ankle Weight  - 1 x daily -  5 x weekly - 3 sets - 10 reps - Sit to Stand with Armchair  - 1 x daily - 7 x weekly - 3 sets - 5 reps  PATIENT EDUCATION: Education details: HEP updates Person educated: Patient and Spouse Education method: Explanation, Demonstration, Verbal cues, and Handouts Education comprehension: verbalized understanding, returned demonstration, verbal cues required, and needs further  education  -------------------------- Note: Objective measures were completed at Evaluation unless otherwise noted.  DIAGNOSTIC FINDINGS: CT head 12/2023:  IMPRESSION: 1. No acute intracranial abnormality, specifically, no acute hemorrhage, territorial infarction, or intracranial mass. 2. Global cerebral volume loss with sequelae of advanced chronic ischemic microvascular disease.  COGNITION: Overall cognitive status: History of cognitive impairments - at baseline and wife reports concern regarding cognition   SENSATION: Light touch: Impaired  Impaired to light touch LLE  MUSCLE TONE: LLE: Within functional limits    POSTURE: rounded shoulders, forward head, posterior pelvic tilt, and flexed trunk   LOWER EXTREMITY ROM:   WFL BLEs  LOWER EXTREMITY MMT:    MMT Right Eval Left Eval  Hip flexion 3+ 3+  Hip extension    Hip abduction 4 4  Hip adduction 4 4  Hip internal rotation    Hip external rotation    Knee flexion 3+ 3+  Knee extension 3+ 3+  Ankle dorsiflexion 3+ 3  Ankle plantarflexion    Ankle inversion    Ankle eversion    (Blank rows = not tested)    TRANSFERS: Sit to stand: CGA and tends to use hands at walker  Assistive device utilized: Environmental Consultant - 2 wheeled     Stand to sit: CGA  Assistive device utilized: Environmental Consultant - 2 wheeled      GAIT: Findings: Gait Characteristics: pt steps through, but stops to move walker, step through pattern, decreased step length- Right, decreased step length- Left, trunk flexed, poor foot clearance- Right, and poor foot clearance- Left, Distance walked: 50 ft, Assistive device utilized:Walker - 2 wheeled, Level of assistance: CGA and Min A, and Comments: tends to look around to other people in gym and stops walking, needs redirection to task several times with testing  FUNCTIONAL TESTS:  5 times sit to stand: 71.84 sec with definite UE support Timed up and go (TUG): 129.53 sec 10 meter walk test: 149.56 sec = 0.22 ft/sec  Needs  redirection to task multiple times during eval tasks                                                                                                                              TREATMENT DATE: 06/10/2024    PATIENT EDUCATION: Education details: Eval results, POC, initiated HEP Person educated: Patient and Spouse Education method: Explanation, Demonstration, Verbal cues, and Handouts Education comprehension: verbalized understanding, returned demonstration, and needs further education  HOME EXERCISE PROGRAM: Access Code: [email or web address] URL: [email or web address] Date: 06/10/2024 Prepared by: [facility] - Outpatient  Rehab - [facility]  Exercises - Seated March  -  2 x daily - 7 x weekly - 2 sets - 10 reps - Seated Long Arc Quad  - 2 x daily - 7 x weekly - 2 sets - 10 reps - Seated Heel Toe Raises  - 2 x daily - 7 x weekly - 2 sets - 10 reps  GOALS: Goals reviewed with patient? Yes  SHORT TERM GOALS: Target date: 07/10/2024  Pt will be supervision with HEP for improved strength, balance, transfers, gait. Baseline: Goal status: INITIAL  2.  Pt will improve 5x sit<>stand to less than or equal to 45 sec to demonstrate improved functional strength and transfer efficiency. Baseline: 71.84 sec Goal status: INITIAL  3.  Pt will improve TUG score to less than or equal to 60 sec for decreased fall risk. Baseline: 129.53 sec Goal status: INITIAL  LONG TERM GOALS: Target date: 08/07/2024  Pt will be supervision with HEP for improved strength, balance, transfers, gait. Baseline:  Goal status: INITIAL  2.  Pt will improve 5x sit<>stand to less than or equal to 20 sec to demonstrate improved functional strength and transfer efficiency. Baseline:  Goal status: INITIAL  3.  Pt will improve TUG score to less than or equal to 30 sec for decreased fall risk. Baseline:  Goal status: INITIAL  4.  Pt will improve gait velocity to at least 1.8 ft/sec for improved gait  efficiency and safety. Baseline: 0.22 ft/sec Goal status: INITIAL  5.  Pt will ambulate at least 500 ft, indoors and outdoors surfaces with RW, supervision, for improved community gait. Baseline:  Goal status: INITIAL  6.  Berg Balance test TBA and to improved to at least 45/56 for decreased risk of falls. Baseline:  Goal status: INITIAL  ASSESSMENT:  CLINICAL IMPRESSION: Pt presents today with reports of 2 falls; he reports he is able to crawl and get up on his own, and *wife reports he needs significant assistance to get up.  He notes no injuries.  Skilled PT session focused on review and progression of HEP.  They have ankle weights at home, so able to progress to seated resisted exercises.  Worked on sit to stand technique, and pt needs consistent cues throughout for hand placement, for foot placement to allow for more equal weightbearing through LLE.  Wife reports his transfer practice and walking today in session look really good; he does need consistent cues throughout for redirection to tasks in session.   Pt will continue to benefit from skilled PT towards goals for improved functional mobility and decreased fall risk.   EVAL:  Patient is a 78 y.o. male who was seen today for physical therapy evaluation and treatment for hemiplegia and hemiparesis with cerebral infarction. Pt had CVA affecting L side in 2022; wife reports more decline recently in mobility, with wife providing all assistance for caregiver, ADL, gait tasks.  Pt has had 4 falls in the past 6 months.  Pt presents with decreased strength, decreased balance, abnormal posture (Decreased vision and hearing, per wife report-not assessed in eval today), decreased independence with gait, decreased safety awareness, decreased sensation LLE.  Pt is at fall risk per TUG, FTSTS, and gait velocity measures; pt moves very slowly and is distracted by external stimuli in therapy environment, needing redirection to task multiple times.  *Of  note, wife reports concern over memory and word finding/cognition as well as ADLs and R shoulder issues/LUE weakness from CVA.  She/pt are in agreement to request OT and speech therapy referrals.*  Pt would benefit  from skilled PT to address the above stated deficits to decrease fall risk and improve overall functional mobility and independence.  OBJECTIVE IMPAIRMENTS: Abnormal gait, decreased activity tolerance, decreased balance, decreased cognition, decreased mobility, difficulty walking, decreased strength, decreased safety awareness, and postural dysfunction.   ACTIVITY LIMITATIONS: bending, sitting, standing, transfers, bathing, toileting, dressing, self feeding, reach over head, hygiene/grooming, locomotion level, and caring for others  PARTICIPATION LIMITATIONS: meal prep, cleaning, laundry, medication management, interpersonal relationship, driving, shopping, and community activity  PERSONAL FACTORS: 3+ comorbidities: see above for PMH are also affecting patient's functional outcome.   REHAB POTENTIAL: Good  CLINICAL DECISION MAKING: Evolving/moderate complexity  EVALUATION COMPLEXITY: Moderate  PLAN:  PT FREQUENCY: 2x/week  PT DURATION: 8 weeks plus eval visit  PLANNED INTERVENTIONS: 97750- Physical Performance Testing, 97110-Therapeutic exercises, 97530- Therapeutic activity, [id number]- Neuromuscular re-education, 97535- Self Care, [id number]- Manual therapy, [phone]- Gait training, Patient/Family education, Balance training, and DME instructions  PLAN FOR NEXT SESSION: Review HEP and progress.  Assess Berg; follow up on OT and speech therapy referrals.   STARLET GREIG ORN., PT 06/15/2024, 11:11 AM   [facility] at [facility] [address] [place], [address] [place], [place] [address] Phone # [phone] Fax # [phone]      "

## 2024-06-19 ENCOUNTER — Ambulatory Visit

## 2024-06-19 DIAGNOSIS — M6281 Muscle weakness (generalized): Secondary | ICD-10-CM

## 2024-06-19 DIAGNOSIS — R2681 Unsteadiness on feet: Secondary | ICD-10-CM

## 2024-06-19 DIAGNOSIS — R293 Abnormal posture: Secondary | ICD-10-CM

## 2024-06-19 DIAGNOSIS — R2689 Other abnormalities of gait and mobility: Secondary | ICD-10-CM

## 2024-06-19 NOTE — Therapy (Signed)
 " OUTPATIENT PHYSICAL THERAPY NEURO TREATMENT NOTE   Patient Name: Nicholas Mckenzie MRN: [id number] DOB:[birth year], 78 y.o., male Today's Date: 06/22/2024   PCP: Vernon Velna SAUNDERS, MD  REFERRING PROVIDER: Vernon Velna SAUNDERS, MD   END OF SESSION:  PT End of Session - 06/22/24 0930     Visit Number 4    Number of Visits 17    Date for Recertification  08/07/24    Authorization Type [employer] Medicare-approved 17 PT visits from 06/10/24-08/19/24.    Authorization - Number of Visits 17    Progress Note Due on Visit 10    PT Start Time 0848    PT Stop Time 0930    PT Time Calculation (min) 42 min    Equipment Utilized During Treatment Gait belt    Activity Tolerance Patient tolerated treatment well    Behavior During Therapy WFL for tasks assessed/performed            Past Medical History:  Diagnosis Date   Acute respiratory failure with hypoxia (HCC) 08/16/2021   Aspiration pneumonia (HCC) 08/15/2021   CKD (chronic kidney disease) stage 3, GFR 30-59 ml/min (HCC)    COVID-19 virus infection 06/06/2021   COVID-19 Labs     No results for input(s): DDIMER, FERRITIN, LDH, CRP in the last 72 hours.             Lab Results      Component    Value    Date           SARSCOV2NAA    POSITIVE (A)    06/06/2021           SARSCOV2NAA    NEGATIVE    04/12/2021           SARSCOV2NAA    NEGATIVE    04/01/2021           SARSCOV2NAA    NEGATIVE    03/11/2021           Hypertension    Kidney stones    Severe sepsis (HCC) 08/16/2021   Sleep apnea    Subdural hematoma (HCC) 06/06/2021   Past Surgical History:  Procedure Laterality Date   BLADDER STONE REMOVAL  12/2021   CATARACT EXTRACTION     IR NEPHROSTOMY EXCHANGE RIGHT  06/07/2021   IR NEPHROSTOMY EXCHANGE RIGHT  08/22/2021   IR NEPHROSTOMY PLACEMENT RIGHT  04/07/2021   IR PATIENT EVAL TECH 0-60 MINS  06/02/2021   kidney stent     MANDIBLE FRACTURE SURGERY     NEPHRECTOMY Right 01/07/2022   Patient Active Problem List   Diagnosis Date Noted    Rectal bleeding 01/02/2024   GI bleed 01/01/2024   Acute kidney injury superimposed on stage 3b chronic kidney disease (HCC) 01/01/2024   Transfusion of blood product refused for religious reason 09/03/2022   Hx of subdural hematoma 08/16/2021   Nephrolithiasis 08/16/2021   Obesity (BMI 30-39.9) 08/16/2021   OSA (obstructive sleep apnea) 08/16/2021   Frequent urination 06/07/2021   UTI (urinary tract infection) 06/07/2021   Ascending aortic aneurysm 06/07/2021   Atrophy of right kidney 06/07/2021   Anemia 06/07/2021   H/O ischemic right PCA stroke 06/06/2021   History of cardioembolic cerebrovascular accident (CVA) 06/06/2021   Right-sided cerebrovascular accident (CVA) (HCC) 04/14/2021   Nonfunctioning kidney 04/14/2021   Goals of care, counseling/discussion 04/03/2021   Ureteral stent occlusion 04/02/2021   Stage 3b chronic kidney disease (HCC) 03/11/2021   Major depressive disorder, recurrent episode, in partial remission 03/11/2021  GAD (generalized anxiety disorder) 07/20/2019   Essential (primary) hypertension 05/25/2019    ONSET DATE: 05/28/2024 MD referral  REFERRING DIAG: P30.645 (ICD-10-CM) - Hemiplegia and hemiparesis following cerebral infarction affecting left non-dominant side   THERAPY DIAG:  Muscle weakness (generalized)  Unsteadiness on feet  Other abnormalities of gait and mobility  Rationale for Evaluation and Treatment: Rehabilitation  SUBJECTIVE:                                                                                                                                                                                             SUBJECTIVE STATEMENT: Nothing new. Wife reports that she recently bought a w/c/rollator combo which has not arrived yet.    Pt accompanied by: significant other, spouse, [name]  PERTINENT HISTORY: CVA 2022, hx of subdural hematoma 2023, chronic kidney disease, HTN  PAIN:  Are you having pain? 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 Yes. Number of falls 4  LIVING ENVIRONMENT: Lives with: lives with their spouse Lives in: House/apartment Stairs: ramp Has following equipment at home: Single point cane, Environmental Consultant - 2 wheeled, and Wheelchair (manual)  PLOF: Independent  PATIENT GOALS: Would like to walk with the walker (not behind the wheelchair anymore)  OBJECTIVE:     TODAY'S TREATMENT: 06/22/24 Activity Comments  Gait training with RW, then with 4WW ~59ft Pt appeared to stand taller in RW; tendency to lean forward and straddle walker wheels with 4WW. Pt received cues for safe use of brakes and armrests, continuous stepping and maintaining attention on task at hand and improve awareness of objects on the R  Standing at TM rail:  Romberg  March Hip ABD Mini squat 2x5 (kept reps low d/t c/o knees feel weak Cueing for safety, to maintain attention on task. Pt with difficulty maintaining romberg balance without UE support   W/c self-propulsion Performed safely                 PATIENT EDUCATION: Education details: edu on pt's performance/safety with RW and 4WW, update on ST/OT referrals Person educated: Patient and Spouse Education method: Explanation, Demonstration, and Tactile cues Education comprehension: verbalized understanding and returned demonstration   Access Code: [email or web address] URL: [email or web address] Date: 06/15/2024 Prepared by: [facility] - Outpatient  Rehab - [facility]  Exercises - Seated Heel Toe Raises  - 2 x daily - 7 x weekly - 2 sets - 10 reps - Seated Hip Flexion  March with Ankle Weights  - 1 x daily - 5 x weekly - 3 sets - 10 reps - Seated Long Arc Quad with Ankle Weight  - 1 x daily - 5 x weekly - 3 sets - 10 reps - Sit to Stand with Armchair  - 1 x daily - 7 x weekly - 3  sets - 5 reps    -------------------------- Note: Objective measures were completed at Evaluation unless otherwise noted.  DIAGNOSTIC FINDINGS: CT head 12/2023:  IMPRESSION: 1. No acute intracranial abnormality, specifically, no acute hemorrhage, territorial infarction, or intracranial mass. 2. Global cerebral volume loss with sequelae of advanced chronic ischemic microvascular disease.  COGNITION: Overall cognitive status: History of cognitive impairments - at baseline and wife reports concern regarding cognition   SENSATION: Light touch: Impaired  Impaired to light touch LLE  MUSCLE TONE: LLE: Within functional limits    POSTURE: rounded shoulders, forward head, posterior pelvic tilt, and flexed trunk   LOWER EXTREMITY ROM:   WFL BLEs  LOWER EXTREMITY MMT:    MMT Right Eval Left Eval  Hip flexion 3+ 3+  Hip extension    Hip abduction 4 4  Hip adduction 4 4  Hip internal rotation    Hip external rotation    Knee flexion 3+ 3+  Knee extension 3+ 3+  Ankle dorsiflexion 3+ 3  Ankle plantarflexion    Ankle inversion    Ankle eversion    (Blank rows = not tested)    TRANSFERS: Sit to stand: CGA and tends to use hands at walker  Assistive device utilized: Environmental Consultant - 2 wheeled     Stand to sit: CGA  Assistive device utilized: Environmental Consultant - 2 wheeled      GAIT: Findings: Gait Characteristics: pt steps through, but stops to move walker, step through pattern, decreased step length- Right, decreased step length- Left, trunk flexed, poor foot clearance- Right, and poor foot clearance- Left, Distance walked: 50 ft, Assistive device utilized:Walker - 2 wheeled, Level of assistance: CGA and Min A, and Comments: tends to look around to other people in gym and stops walking, needs redirection to task several times with testing  FUNCTIONAL TESTS:  5 times sit to stand: 71.84 sec with definite UE support Timed up and go (TUG): 129.53 sec 10 meter walk test: 149.56 sec = 0.22 ft/sec   Needs redirection to task multiple times during eval tasks                                                                                                                              TREATMENT DATE: 06/10/2024    PATIENT EDUCATION: Education details: Eval results, POC, initiated HEP Person educated: Patient and Spouse Education method: Explanation, Demonstration, Verbal cues, and Handouts Education comprehension: verbalized understanding, returned demonstration, and needs further education  HOME EXERCISE PROGRAM: Access Code: [email or web address] URL: [email or web address] Date: 06/10/2024 Prepared by: [facility] - Outpatient  Rehab - [facility]  Exercises -  Seated March  - 2 x daily - 7 x weekly - 2 sets - 10 reps - Seated Long Arc Quad  - 2 x daily - 7 x weekly - 2 sets - 10 reps - Seated Heel Toe Raises  - 2 x daily - 7 x weekly - 2 sets - 10 reps  GOALS: Goals reviewed with patient? Yes  SHORT TERM GOALS: Target date: 07/10/2024  Pt will be supervision with HEP for improved strength, balance, transfers, gait. Baseline: Goal status: INITIAL  2.  Pt will improve 5x sit<>stand to less than or equal to 45 sec to demonstrate improved functional strength and transfer efficiency. Baseline: 71.84 sec Goal status: INITIAL  3.  Pt will improve TUG score to less than or equal to 60 sec for decreased fall risk. Baseline: 129.53 sec Goal status: INITIAL  LONG TERM GOALS: Target date: 08/07/2024  Pt will be supervision with HEP for improved strength, balance, transfers, gait. Baseline:  Goal status: INITIAL  2.  Pt will improve 5x sit<>stand to less than or equal to 20 sec to demonstrate improved functional strength and transfer efficiency. Baseline:  Goal status: INITIAL  3.  Pt will improve TUG score to less than or equal to 30 sec for decreased fall risk. Baseline:  Goal status: INITIAL  4.  Pt will improve gait velocity to at least 1.8 ft/sec for improved gait  efficiency and safety. Baseline: 0.22 ft/sec Goal status: INITIAL  5.  Pt will ambulate at least 500 ft, indoors and outdoors surfaces with RW, supervision, for improved community gait. Baseline:  Goal status: INITIAL  6.  Berg Balance test TBA and to improved to at least 45/56 for decreased risk of falls. Baseline:  Goal status: INITIAL  ASSESSMENT:  CLINICAL IMPRESSION: Patient arrived to session with wife without complaints.Session focused on gait training with various  walkers- patient safe with RW and 4WW but requires consistent cueing to maintain attention and safety. Standing tasks revealed imbalance in Romberg positioning and c/o LE weakness with squats. Patient tolerated session well and without complaints at end of appointment.  EVAL:  Patient is a 78 y.o. male who was seen today for physical therapy evaluation and treatment for hemiplegia and hemiparesis with cerebral infarction. Pt had CVA affecting L side in 2022; wife reports more decline recently in mobility, with wife providing all assistance for caregiver, ADL, gait tasks.  Pt has had 4 falls in the past 6 months.  Pt presents with decreased strength, decreased balance, abnormal posture (Decreased vision and hearing, per wife report-not assessed in eval today), decreased independence with gait, decreased safety awareness, decreased sensation LLE.  Pt is at fall risk per TUG, FTSTS, and gait velocity measures; pt moves very slowly and is distracted by external stimuli in therapy environment, needing redirection to task multiple times.  *Of note, wife reports concern over memory and word finding/cognition as well as ADLs and R shoulder issues/LUE weakness from CVA.  She/pt are in agreement to request OT and speech therapy referrals.*  Pt would benefit from skilled PT to address the above stated deficits to decrease fall risk and improve overall functional mobility and independence.  OBJECTIVE IMPAIRMENTS: Abnormal gait, decreased  activity tolerance, decreased balance, decreased cognition, decreased mobility, difficulty walking, decreased strength, decreased safety awareness, and postural dysfunction.   ACTIVITY LIMITATIONS: bending, sitting, standing, transfers, bathing, toileting, dressing, self feeding, reach over head, hygiene/grooming, locomotion level, and caring for others  PARTICIPATION LIMITATIONS: meal prep, cleaning, laundry, medication management, interpersonal  relationship, driving, shopping, and community activity  PERSONAL FACTORS: 3+ comorbidities: see above for PMH are also affecting patient's functional outcome.   REHAB POTENTIAL: Good  CLINICAL DECISION MAKING: Evolving/moderate complexity  EVALUATION COMPLEXITY: Moderate  PLAN:  PT FREQUENCY: 2x/week  PT DURATION: 8 weeks plus eval visit  PLANNED INTERVENTIONS: 97750- Physical Performance Testing, 97110-Therapeutic exercises, 97530- Therapeutic activity, 97112- Neuromuscular re-education, 97535- Self Care, [id number]- Manual therapy, [phone]- Gait training, Patient/Family education, Balance training, and DME instructions  PLAN FOR NEXT SESSION: Review HEP and progress.; follow up on OT and speech therapy referrals- have these been requested?    Louana Terrilyn Christians, PT, DPT 06/22/2024 9:33 AM  [facility] at [facility] [address] [place], [address] [place], [place] [address] Phone # [phone] Fax # [phone]       "

## 2024-06-19 NOTE — Therapy (Signed)
 " OUTPATIENT PHYSICAL THERAPY NEURO TREATMENT NOTE   Patient Name: Nicholas Mckenzie MRN: [id number] DOB:1946/05/04, 78 y.o., male Today's Date: 06/19/2024   PCP: Vernon Velna SAUNDERS, MD  REFERRING PROVIDER: Vernon Velna SAUNDERS, MD   END OF SESSION:  PT End of Session - 06/19/24 0845     Visit Number 3    Number of Visits 17    Date for Recertification  08/07/24    Authorization Type [employer] Medicare-approved 17 PT visits from 06/10/24-08/19/24.    Authorization - Visit Number 3    Authorization - Number of Visits 17    Progress Note Due on Visit 10    PT Start Time 0845    PT Stop Time 0930    PT Time Calculation (min) 45 min    Equipment Utilized During Treatment Gait belt    Activity Tolerance Patient tolerated treatment well    Behavior During Therapy WFL for tasks assessed/performed           Past Medical History:  Diagnosis Date   Acute respiratory failure with hypoxia (HCC) 08/16/2021   Aspiration pneumonia (HCC) 08/15/2021   CKD (chronic kidney disease) stage 3, GFR 30-59 ml/min (HCC)    COVID-19 virus infection 06/06/2021   COVID-19 Labs     No results for input(s): DDIMER, FERRITIN, LDH, CRP in the last 72 hours.             Lab Results      Component    Value    Date           SARSCOV2NAA    POSITIVE (A)    06/06/2021           SARSCOV2NAA    NEGATIVE    04/12/2021           SARSCOV2NAA    NEGATIVE    04/01/2021           SARSCOV2NAA    NEGATIVE    03/11/2021           Hypertension    Kidney stones    Severe sepsis (HCC) 08/16/2021   Sleep apnea    Subdural hematoma (HCC) 06/06/2021   Past Surgical History:  Procedure Laterality Date   BLADDER STONE REMOVAL  12/2021   CATARACT EXTRACTION     IR NEPHROSTOMY EXCHANGE RIGHT  06/07/2021   IR NEPHROSTOMY EXCHANGE RIGHT  08/22/2021   IR NEPHROSTOMY PLACEMENT RIGHT  04/07/2021   IR PATIENT EVAL TECH 0-60 MINS  06/02/2021   kidney stent     MANDIBLE FRACTURE SURGERY     NEPHRECTOMY Right 01/07/2022   Patient Active Problem  List   Diagnosis Date Noted   Rectal bleeding 01/02/2024   GI bleed 01/01/2024   Acute kidney injury superimposed on stage 3b chronic kidney disease (HCC) 01/01/2024   Transfusion of blood product refused for religious reason 09/03/2022   Hx of subdural hematoma 08/16/2021   Nephrolithiasis 08/16/2021   Obesity (BMI 30-39.9) 08/16/2021   OSA (obstructive sleep apnea) 08/16/2021   Frequent urination 06/07/2021   UTI (urinary tract infection) 06/07/2021   Ascending aortic aneurysm 06/07/2021   Atrophy of right kidney 06/07/2021   Anemia 06/07/2021   H/O ischemic right PCA stroke 06/06/2021   History of cardioembolic cerebrovascular accident (CVA) 06/06/2021   Right-sided cerebrovascular accident (CVA) (HCC) 04/14/2021   Nonfunctioning kidney 04/14/2021   Goals of care, counseling/discussion 04/03/2021   Ureteral stent occlusion 04/02/2021   Stage 3b chronic kidney disease (HCC) 03/11/2021   Major depressive  disorder, recurrent episode, in partial remission 03/11/2021   GAD (generalized anxiety disorder) 07/20/2019   Essential (primary) hypertension 05/25/2019    ONSET DATE: 05/28/2024 MD referral  REFERRING DIAG: P30.645 (ICD-10-CM) - Hemiplegia and hemiparesis following cerebral infarction affecting left non-dominant side   THERAPY DIAG:  Muscle weakness (generalized)  Unsteadiness on feet  Other abnormalities of gait and mobility  Abnormal posture  Rationale for Evaluation and Treatment: Rehabilitation  SUBJECTIVE:                                                                                                                                                                                             SUBJECTIVE STATEMENT: Doing ok, no new issues. Pt accompanied by: significant other, spouse, [name]  PERTINENT HISTORY: CVA 2022, hx of subdural hematoma 2023, chronic kidney disease, HTN  PAIN:  Are you having pain? 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 Yes. Number of falls 4  LIVING ENVIRONMENT: Lives with: lives with their spouse Lives in: House/apartment Stairs: ramp Has following equipment at home: Single point cane, Environmental Consultant - 2 wheeled, and Wheelchair (manual)  PLOF: Independent  PATIENT GOALS: Would like to walk with the walker (not behind the wheelchair anymore)  OBJECTIVE:   TODAY'S TREATMENT: 12/25 Activity Comments  Berg Balance Test 29/56--see below  Static balance/postural re-education Back to door frame to use of tactile support for upright posture/vertical--instructed in techniques to progress from static standing with tactile support to static standing multisensory demands  Gait training -w/ RW and CGA-min A with verbal/visual aids for increase step length. Improved reciprocal steps with therapist pushing AD              TODAY'S TREATMENT: 06/15/2024 Activity Comments  Reviewed HEP and pt return demo with cues   Seated PRE: Seated march x 10 LAQ x 10 Seated hip abduction-step out x 10 Seated hamstring curls-red band 2 x 10  2#  Sit to stand, 2 x 5 reps Consistent cues for hand, foot placement, min guard  Standing at walker, marching in place 2 x 10 reps   Gait with RW, 50 ft x 2 reps Min assist and cues to keep consistent pace-pt often does a step through/stop pattern and looks around clinic, needs cues to redirect to task      Access Code: [email or web address] URL: [email or web address] Date: 06/15/2024 Prepared by: [facility] - Outpatient  Rehab - [facility]  Exercises -  Seated Heel Toe Raises  - 2 x daily - 7 x weekly - 2 sets - 10 reps - Seated Hip Flexion March with Ankle Weights  - 1 x daily - 5 x weekly - 3 sets - 10 reps - Seated Long Arc Quad with Ankle Weight  - 1 x daily - 5 x weekly - 3 sets - 10 reps - Sit to Stand with  Armchair  - 1 x daily - 7 x weekly - 3 sets - 5 reps - Standing Balance in Corner with Eyes Closed  - 1 x daily - 7 x weekly - 3 sets - 30 sec hold  PATIENT EDUCATION: Education details: HEP updates Person educated: Patient and Spouse Education method: Explanation, Demonstration, Verbal cues, and Handouts Education comprehension: verbalized understanding, returned demonstration, verbal cues required, and needs further education   [facility] PT Assessment - 06/19/24 0001       Standardized Balance Assessment   Standardized Balance Assessment Berg Balance Test      Berg Balance Test   Sit to Stand Able to stand  independently using hands    Standing Unsupported Able to stand 2 minutes with supervision    Sitting with Back Unsupported but Feet Supported on Floor or Stool Able to sit safely and securely 2 minutes    Stand to Sit Sits independently, has uncontrolled descent    Transfers Able to transfer safely, definite need of hands    Standing Unsupported with Eyes Closed Able to stand 10 seconds safely    Standing Unsupported with Feet Together Able to place feet together independently but unable to hold for 30 seconds    From Standing, Reach Forward with Outstretched Arm Can reach forward >12 cm safely (5)    From Standing Position, Pick up Object from Floor Able to pick up shoe, needs supervision    From Standing Position, Turn to Look Behind Over each Shoulder Needs supervision when turning    Turn 360 Degrees Needs close supervision or verbal cueing    Standing Unsupported, Alternately Place Feet on Step/Stool Able to complete >2 steps/needs minimal assist    Standing Unsupported, One Foot in [employer] balance while stepping or standing    Standing on One Leg Unable to try or needs assist to prevent fall    Total Score 29          -------------------------- Note: Objective measures were completed at Evaluation unless otherwise noted.  DIAGNOSTIC FINDINGS: CT head 12/2023:   IMPRESSION: 1. No acute intracranial abnormality, specifically, no acute hemorrhage, territorial infarction, or intracranial mass. 2. Global cerebral volume loss with sequelae of advanced chronic ischemic microvascular disease.  COGNITION: Overall cognitive status: History of cognitive impairments - at baseline and wife reports concern regarding cognition   SENSATION: Light touch: Impaired  Impaired to light touch LLE  MUSCLE TONE: LLE: Within functional limits    POSTURE: rounded shoulders, forward head, posterior pelvic tilt, and flexed trunk   LOWER EXTREMITY ROM:   WFL BLEs  LOWER EXTREMITY MMT:    MMT Right Eval Left Eval  Hip flexion 3+ 3+  Hip extension    Hip abduction 4 4  Hip adduction 4 4  Hip internal rotation    Hip external rotation    Knee flexion 3+ 3+  Knee extension 3+ 3+  Ankle dorsiflexion 3+ 3  Ankle plantarflexion    Ankle inversion    Ankle eversion    (Blank rows = not tested)    TRANSFERS: Sit  to stand: CGA and tends to use hands at walker  Assistive device utilized: Environmental Consultant - 2 wheeled     Stand to sit: CGA  Assistive device utilized: Environmental Consultant - 2 wheeled      GAIT: Findings: Gait Characteristics: pt steps through, but stops to move walker, step through pattern, decreased step length- Right, decreased step length- Left, trunk flexed, poor foot clearance- Right, and poor foot clearance- Left, Distance walked: 50 ft, Assistive device utilized:Walker - 2 wheeled, Level of assistance: CGA and Min A, and Comments: tends to look around to other people in gym and stops walking, needs redirection to task several times with testing  FUNCTIONAL TESTS:  5 times sit to stand: 71.84 sec with definite UE support Timed up and go (TUG): 129.53 sec 10 meter walk test: 149.56 sec = 0.22 ft/sec  Needs redirection to task multiple times during eval tasks                                                                                                                               TREATMENT DATE: 06/10/2024    PATIENT EDUCATION: Education details: Eval results, POC, initiated HEP Person educated: Patient and Spouse Education method: Explanation, Demonstration, Verbal cues, and Handouts Education comprehension: verbalized understanding, returned demonstration, and needs further education  HOME EXERCISE PROGRAM: Access Code: [email or web address] URL: [email or web address] Date: 06/10/2024 Prepared by: [facility] - Outpatient  Rehab - [facility]  Exercises - Seated March  - 2 x daily - 7 x weekly - 2 sets - 10 reps - Seated Long Arc Quad  - 2 x daily - 7 x weekly - 2 sets - 10 reps - Seated Heel Toe Raises  - 2 x daily - 7 x weekly - 2 sets - 10 reps  GOALS: Goals reviewed with patient? Yes  SHORT TERM GOALS: Target date: 07/10/2024  Pt will be supervision with HEP for improved strength, balance, transfers, gait. Baseline: Goal status: INITIAL  2.  Pt will improve 5x sit<>stand to less than or equal to 45 sec to demonstrate improved functional strength and transfer efficiency. Baseline: 71.84 sec Goal status: INITIAL  3.  Pt will improve TUG score to less than or equal to 60 sec for decreased fall risk. Baseline: 129.53 sec Goal status: INITIAL  LONG TERM GOALS: Target date: 08/07/2024  Pt will be supervision with HEP for improved strength, balance, transfers, gait. Baseline:  Goal status: INITIAL  2.  Pt will improve 5x sit<>stand to less than or equal to 20 sec to demonstrate improved functional strength and transfer efficiency. Baseline:  Goal status: INITIAL  3.  Pt will improve TUG score to less than or equal to 30 sec for decreased fall risk. Baseline:  Goal status: INITIAL  4.  Pt will improve gait velocity to at least 1.8 ft/sec for improved gait efficiency and safety. Baseline: 0.22 ft/sec Goal status: INITIAL  5.  Pt will ambulate at least 500 ft,  indoors and outdoors surfaces with RW, supervision, for  improved community gait. Baseline:  Goal status: INITIAL  6.  Berg Balance test TBA and to improved to at least 45/56 for decreased risk of falls. Baseline: 29/56 Goal status: INITIAL  ASSESSMENT:  CLINICAL IMPRESSION: Session initiated with Lars Balance Test to assess for balance and risk for falls with score of 29/56 indicating high risk for falls.  Significant difficulty with demands of single limb support often requiring UE support or assist to compensate/correct which would indicate high risk for falls during such tasks as ADL and housekeeping.  Instructed pt and spouse in techniques to improve upright and unsupported standing posture initiating activity with physical support of door frame for vertical reference and progressing from external support to multisensory demands for improved postural control/proprioceptive awareness.  Gait training w/ RW and difficulty with maintaining stride length and continuous steps with step-to pattern, trials w/ verbal/visual reference to sequence stride length with mixed results and best performance from therapist pushing AD to advance forward w/ pt spontaneously taking continuous steps to maintain pace w/ AD.   EVAL:  Patient is a 78 y.o. male who was seen today for physical therapy evaluation and treatment for hemiplegia and hemiparesis with cerebral infarction. Pt had CVA affecting L side in 2022; wife reports more decline recently in mobility, with wife providing all assistance for caregiver, ADL, gait tasks.  Pt has had 4 falls in the past 6 months.  Pt presents with decreased strength, decreased balance, abnormal posture (Decreased vision and hearing, per wife report-not assessed in eval today), decreased independence with gait, decreased safety awareness, decreased sensation LLE.  Pt is at fall risk per TUG, FTSTS, and gait velocity measures; pt moves very slowly and is distracted by external stimuli in therapy environment, needing redirection to task  multiple times.  *Of note, wife reports concern over memory and word finding/cognition as well as ADLs and R shoulder issues/LUE weakness from CVA.  She/pt are in agreement to request OT and speech therapy referrals.*  Pt would benefit from skilled PT to address the above stated deficits to decrease fall risk and improve overall functional mobility and independence.  OBJECTIVE IMPAIRMENTS: Abnormal gait, decreased activity tolerance, decreased balance, decreased cognition, decreased mobility, difficulty walking, decreased strength, decreased safety awareness, and postural dysfunction.   ACTIVITY LIMITATIONS: bending, sitting, standing, transfers, bathing, toileting, dressing, self feeding, reach over head, hygiene/grooming, locomotion level, and caring for others  PARTICIPATION LIMITATIONS: meal prep, cleaning, laundry, medication management, interpersonal relationship, driving, shopping, and community activity  PERSONAL FACTORS: 3+ comorbidities: see above for PMH are also affecting patient's functional outcome.   REHAB POTENTIAL: Good  CLINICAL DECISION MAKING: Evolving/moderate complexity  EVALUATION COMPLEXITY: Moderate  PLAN:  PT FREQUENCY: 2x/week  PT DURATION: 8 weeks plus eval visit  PLANNED INTERVENTIONS: 97750- Physical Performance Testing, 97110-Therapeutic exercises, 97530- Therapeutic activity, [id number]- Neuromuscular re-education, 97535- Self Care, [id number]- Manual therapy, [phone]- Gait training, Patient/Family education, Balance training, and DME instructions  PLAN FOR NEXT SESSION: Review HEP and progress.  Try counted steps from point A to B to see if this helps with stride length/continuous steps; follow up on OT and speech therapy referrals.   Jonette MARLA Sandifer, PT 06/19/2024, 8:45 AM   [facility] at [facility] [address] [place], [address] [place], [place] [address] Phone # [phone] Fax # [phone]      "

## 2024-06-22 ENCOUNTER — Ambulatory Visit: Admitting: Physical Therapy

## 2024-06-22 ENCOUNTER — Encounter: Payer: Self-pay | Admitting: Physical Therapy

## 2024-06-22 DIAGNOSIS — M6281 Muscle weakness (generalized): Secondary | ICD-10-CM | POA: Diagnosis not present

## 2024-06-22 DIAGNOSIS — R2681 Unsteadiness on feet: Secondary | ICD-10-CM

## 2024-06-22 DIAGNOSIS — R2689 Other abnormalities of gait and mobility: Secondary | ICD-10-CM

## 2024-06-24 ENCOUNTER — Ambulatory Visit

## 2024-06-24 ENCOUNTER — Telehealth: Payer: Self-pay

## 2024-06-24 DIAGNOSIS — R293 Abnormal posture: Secondary | ICD-10-CM

## 2024-06-24 DIAGNOSIS — R2681 Unsteadiness on feet: Secondary | ICD-10-CM

## 2024-06-24 DIAGNOSIS — M6281 Muscle weakness (generalized): Secondary | ICD-10-CM | POA: Diagnosis not present

## 2024-06-24 DIAGNOSIS — R2689 Other abnormalities of gait and mobility: Secondary | ICD-10-CM

## 2024-06-24 NOTE — Telephone Encounter (Signed)
 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  The patient would benefit from Occupational and Speech evaluation for ADL deficits and cognitive deficits.   If you agree, please place an order in [email or web address] in [facility] or fax the order to [phone]. Thank you, 1:14 PM, 06/24/2024 M. Kelly Aveline Daus, PT, DPT Physical Therapist- [place] Office Number: [phone]   [facility], [facility] [address] [place], [place]  [address] Phone:  [phone] Fax:  [phone]

## 2024-06-24 NOTE — Therapy (Signed)
 " OUTPATIENT PHYSICAL THERAPY NEURO TREATMENT NOTE   Patient Name: Nicholas Mckenzie MRN: [id number] DOB:1946-02-13, 78 y.o., male Today's Date: 06/24/2024   PCP: Nicholas Velna SAUNDERS, MD  REFERRING PROVIDER: Vernon Velna SAUNDERS, MD   END OF SESSION:  PT End of Session - 06/24/24 0857     Visit Number 5    Number of Visits 17    Date for Recertification  08/07/24    Authorization Type [employer] Medicare-approved 17 PT visits from 06/10/24-08/19/24.    Authorization - Visit Number 5    Authorization - Number of Visits 17    Progress Note Due on Visit 10    PT Start Time [phone]    PT Stop Time 0932    PT Time Calculation (min) 39 min    Equipment Utilized During Treatment Gait belt    Activity Tolerance Patient tolerated treatment well    Behavior During Therapy WFL for tasks assessed/performed            Past Medical History:  Diagnosis Date   Acute respiratory failure with hypoxia (HCC) 08/16/2021   Aspiration pneumonia (HCC) 08/15/2021   CKD (chronic kidney disease) stage 3, GFR 30-59 ml/min (HCC)    COVID-19 virus infection 06/06/2021   COVID-19 Labs     No results for input(s): DDIMER, FERRITIN, LDH, CRP in the last 72 hours.             Lab Results      Component    Value    Date           SARSCOV2NAA    POSITIVE (A)    06/06/2021           SARSCOV2NAA    NEGATIVE    04/12/2021           SARSCOV2NAA    NEGATIVE    04/01/2021           SARSCOV2NAA    NEGATIVE    03/11/2021           Hypertension    Kidney stones    Severe sepsis (HCC) 08/16/2021   Sleep apnea    Subdural hematoma (HCC) 06/06/2021   Past Surgical History:  Procedure Laterality Date   BLADDER STONE REMOVAL  12/2021   CATARACT EXTRACTION     IR NEPHROSTOMY EXCHANGE RIGHT  06/07/2021   IR NEPHROSTOMY EXCHANGE RIGHT  08/22/2021   IR NEPHROSTOMY PLACEMENT RIGHT  04/07/2021   IR PATIENT EVAL TECH 0-60 MINS  06/02/2021   kidney stent     MANDIBLE FRACTURE SURGERY     NEPHRECTOMY Right 01/07/2022   Patient Active  Problem List   Diagnosis Date Noted   Rectal bleeding 01/02/2024   GI bleed 01/01/2024   Acute kidney injury superimposed on stage 3b chronic kidney disease (HCC) 01/01/2024   Transfusion of blood product refused for religious reason 09/03/2022   Hx of subdural hematoma 08/16/2021   Nephrolithiasis 08/16/2021   Obesity (BMI 30-39.9) 08/16/2021   OSA (obstructive sleep apnea) 08/16/2021   Frequent urination 06/07/2021   UTI (urinary tract infection) 06/07/2021   Ascending aortic aneurysm 06/07/2021   Atrophy of right kidney 06/07/2021   Anemia 06/07/2021   H/O ischemic right PCA stroke 06/06/2021   History of cardioembolic cerebrovascular accident (CVA) 06/06/2021   Right-sided cerebrovascular accident (CVA) (HCC) 04/14/2021   Nonfunctioning kidney 04/14/2021   Goals of care, counseling/discussion 04/03/2021   Ureteral stent occlusion 04/02/2021   Stage 3b chronic kidney disease (HCC) 03/11/2021   Major  depressive disorder, recurrent episode, in partial remission 03/11/2021   GAD (generalized anxiety disorder) 07/20/2019   Essential (primary) hypertension 05/25/2019    ONSET DATE: 05/28/2024 MD referral  REFERRING DIAG: P30.645 (ICD-10-CM) - Hemiplegia and hemiparesis following cerebral infarction affecting left non-dominant side   THERAPY DIAG:  Muscle weakness (generalized)  Unsteadiness on feet  Other abnormalities of gait and mobility  Abnormal posture  Rationale for Evaluation and Treatment: Rehabilitation  SUBJECTIVE:                                                                                                                                                                                             SUBJECTIVE STATEMENT: Doing ok, no new issues. Have not yet heard about OT/ST    Pt accompanied by: significant other, spouse, Nicholas Mckenzie  PERTINENT HISTORY: CVA 2022, hx of subdural hematoma 2023, chronic kidney disease, HTN  PAIN:  Are you having pain? 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 Yes. Number of falls 4  LIVING ENVIRONMENT: Lives with: lives with their spouse Lives in: House/apartment Stairs: ramp Has following equipment at home: Single point cane, Environmental Consultant - 2 wheeled, and Wheelchair (manual)  PLOF: Independent  PATIENT GOALS: Would like to walk with the walker (not behind the wheelchair anymore)  OBJECTIVE:    TODAY'S TREATMENT: 06/24/24 Activity Comments  NU-step level 2 w/ assist x 6 min To facilitate coordination and alternating movement goal of 70-80. Poor ability to maintain requiring physical assist/verbal cues for attention  Dynamic standing balance -forwrad and stepping over canes for step height/SLS-visual/verbal cues for step length -unsupported standing reaching across midline to retrieve cones and transfer right to left for improved attention/coordination  Gait training Strategies to improve step length and with verbal and visual cues              TODAY'S TREATMENT: 06/22/24 Activity Comments  Gait training with RW, then with 4WW ~40ft Pt appeared to stand taller in RW; tendency to lean forward and straddle walker wheels with 4WW. Pt received cues for safe use of brakes and armrests, continuous stepping and maintaining attention on task at hand and improve awareness of objects on the R  Standing at TM rail:  Romberg  March Hip ABD Mini squat 2x5 (kept reps low d/t c/o knees feel weak Cueing for safety, to maintain attention on task. Pt with difficulty maintaining romberg balance without UE support   W/c self-propulsion  Performed safely                 PATIENT EDUCATION: Education details: edu on pt's performance/safety with RW and 4WW, update on ST/OT referrals Person educated: Patient and Spouse Education method:  Explanation, Demonstration, and Tactile cues Education comprehension: verbalized understanding and returned demonstration   Access Code: [email or web address] URL: [email or web address] Date: 06/15/2024 Prepared by: [facility] [place] - Outpatient  Rehab - [facility]  Exercises - Seated Heel Toe Raises  - 2 x daily - 7 x weekly - 2 sets - 10 reps - Seated Hip Flexion March with Ankle Weights  - 1 x daily - 5 x weekly - 3 sets - 10 reps - Seated Long Arc Quad with Ankle Weight  - 1 x daily - 5 x weekly - 3 sets - 10 reps - Sit to Stand with Armchair  - 1 x daily - 7 x weekly - 3 sets - 5 reps    -------------------------- Note: Objective measures were completed at Evaluation unless otherwise noted.  DIAGNOSTIC FINDINGS: CT head 12/2023:  IMPRESSION: 1. No acute intracranial abnormality, specifically, no acute hemorrhage, territorial infarction, or intracranial mass. 2. Global cerebral volume loss with sequelae of advanced chronic ischemic microvascular disease.  COGNITION: Overall cognitive status: History of cognitive impairments - at baseline and wife reports concern regarding cognition   SENSATION: Light touch: Impaired  Impaired to light touch LLE  MUSCLE TONE: LLE: Within functional limits    POSTURE: rounded shoulders, forward head, posterior pelvic tilt, and flexed trunk   LOWER EXTREMITY ROM:   WFL BLEs  LOWER EXTREMITY MMT:    MMT Right Eval Left Eval  Hip flexion 3+ 3+  Hip extension    Hip abduction 4 4  Hip adduction 4 4  Hip internal rotation    Hip external rotation    Knee flexion 3+ 3+  Knee extension 3+ 3+  Ankle dorsiflexion 3+ 3  Ankle plantarflexion    Ankle inversion    Ankle eversion    (Blank rows = not tested)    TRANSFERS: Sit to stand: CGA and tends to use hands at walker  Assistive device utilized: Environmental Consultant - 2 wheeled     Stand to sit: CGA  Assistive device utilized: Environmental Consultant - 2 wheeled      GAIT: Findings: Gait Characteristics:  pt steps through, but stops to move walker, step through pattern, decreased step length- Right, decreased step length- Left, trunk flexed, poor foot clearance- Right, and poor foot clearance- Left, Distance walked: 50 ft, Assistive device utilized:Walker - 2 wheeled, Level of assistance: CGA and Min A, and Comments: tends to look around to other people in gym and stops walking, needs redirection to task several times with testing  FUNCTIONAL TESTS:  5 times sit to stand: 71.84 sec with definite UE support Timed up and go (TUG): 129.53 sec 10 meter walk test: 149.56 sec = 0.22 ft/sec  Needs redirection to task multiple times during eval tasks  TREATMENT DATE: 06/10/2024    PATIENT EDUCATION: Education details: Eval results, POC, initiated HEP Person educated: Patient and Spouse Education method: Explanation, Demonstration, Verbal cues, and Handouts Education comprehension: verbalized understanding, returned demonstration, and needs further education  HOME EXERCISE PROGRAM: Access Code: [email or web address] URL: [email or web address] Date: 06/10/2024 Prepared by: [facility] - Outpatient  Rehab - [facility]  Exercises - Seated March  - 2 x daily - 7 x weekly - 2 sets - 10 reps - Seated Long Arc Quad  - 2 x daily - 7 x weekly - 2 sets - 10 reps - Seated Heel Toe Raises  - 2 x daily - 7 x weekly - 2 sets - 10 reps  GOALS: Goals reviewed with patient? Yes  SHORT TERM GOALS: Target date: 07/10/2024  Pt will be supervision with HEP for improved strength, balance, transfers, gait. Baseline: Goal status: INITIAL  2.  Pt will improve 5x sit<>stand to less than or equal to 45 sec to demonstrate improved functional strength and transfer efficiency. Baseline: 71.84 sec Goal status: INITIAL  3.  Pt will improve TUG score to less than or equal to 60 sec for  decreased fall risk. Baseline: 129.53 sec Goal status: INITIAL  LONG TERM GOALS: Target date: 08/07/2024  Pt will be supervision with HEP for improved strength, balance, transfers, gait. Baseline:  Goal status: INITIAL  2.  Pt will improve 5x sit<>stand to less than or equal to 20 sec to demonstrate improved functional strength and transfer efficiency. Baseline:  Goal status: INITIAL  3.  Pt will improve TUG score to less than or equal to 30 sec for decreased fall risk. Baseline:  Goal status: INITIAL  4.  Pt will improve gait velocity to at least 1.8 ft/sec for improved gait efficiency and safety. Baseline: 0.22 ft/sec Goal status: INITIAL  5.  Pt will ambulate at least 500 ft, indoors and outdoors surfaces with RW, supervision, for improved community gait. Baseline:  Goal status: INITIAL  6.  Berg Balance test TBA and to improved to at least 45/56 for decreased risk of falls. Baseline:  Goal status: INITIAL  ASSESSMENT:  CLINICAL IMPRESSION: Focus with improving coordination and right-left discrimination with tasks of rapid coordination and reaching across midline with improved insight with verbal direction for scanning/attention and able to stand unsupported and peform task with supervision. Gait training w/ min A with strategies of visual, verbal, and occasional tactile cues for sequence and step length with marginal carryover when cues reduced.  Continued sessions to progress POC details and trial strategies to improve gait speed and safety with functional mobility  EVAL:  Patient is a 77 y.o. male who was seen today for physical therapy evaluation and treatment for hemiplegia and hemiparesis with cerebral infarction. Pt had CVA affecting L side in 2022; wife reports more decline recently in mobility, with wife providing all assistance for caregiver, ADL, gait tasks.  Pt has had 4 falls in the past 6 months.  Pt presents with decreased strength, decreased balance, abnormal  posture (Decreased vision and hearing, per wife report-not assessed in eval today), decreased independence with gait, decreased safety awareness, decreased sensation LLE.  Pt is at fall risk per TUG, FTSTS, and gait velocity measures; pt moves very slowly and is distracted by external stimuli in therapy environment, needing redirection to task multiple times.  *Of note, wife reports concern over memory and word finding/cognition as well as ADLs and R shoulder issues/LUE weakness from CVA.  She/pt are in agreement to request  OT and speech therapy referrals.*  Pt would benefit from skilled PT to address the above stated deficits to decrease fall risk and improve overall functional mobility and independence.  OBJECTIVE IMPAIRMENTS: Abnormal gait, decreased activity tolerance, decreased balance, decreased cognition, decreased mobility, difficulty walking, decreased strength, decreased safety awareness, and postural dysfunction.   ACTIVITY LIMITATIONS: bending, sitting, standing, transfers, bathing, toileting, dressing, self feeding, reach over head, hygiene/grooming, locomotion level, and caring for others  PARTICIPATION LIMITATIONS: meal prep, cleaning, laundry, medication management, interpersonal relationship, driving, shopping, and community activity  PERSONAL FACTORS: 3+ comorbidities: see above for PMH are also affecting patient's functional outcome.   REHAB POTENTIAL: Good  CLINICAL DECISION MAKING: Evolving/moderate complexity  EVALUATION COMPLEXITY: Moderate  PLAN:  PT FREQUENCY: 2x/week  PT DURATION: 8 weeks plus eval visit  PLANNED INTERVENTIONS: 97750- Physical Performance Testing, 97110-Therapeutic exercises, 97530- Therapeutic activity, 97112- Neuromuscular re-education, 97535- Self Care, [id number]- Manual therapy, [phone]- Gait training, Patient/Family education, Balance training, and DME instructions  PLAN FOR NEXT SESSION: Review HEP and progress.; follow up on OT and speech therapy  referrals- have these been requested?    9:55 AM, 06/24/2024 M. Kelly Perez Dirico, PT, DPT Physical Therapist- [place] Office Number: [phone]        "

## 2024-06-26 NOTE — Therapy (Signed)
 " OUTPATIENT PHYSICAL THERAPY NEURO TREATMENT NOTE   Patient Name: Nicholas Mckenzie MRN: [id number] DOB:01-05-1946, 79 y.o., male Today's Date: 06/29/2024   PCP: Vernon Velna SAUNDERS, MD  REFERRING PROVIDER: Vernon Velna SAUNDERS, MD   END OF SESSION:  PT End of Session - 06/29/24 0928     Visit Number 6    Number of Visits 17    Date for Recertification  08/07/24    Authorization Type [employer] Medicare-approved 17 PT visits from 06/10/24-08/19/24.    Authorization - Visit Number 6    Authorization - Number of Visits 17    Progress Note Due on Visit 10    PT Start Time 0848    PT Stop Time 0928    PT Time Calculation (min) 40 min    Equipment Utilized During Treatment Gait belt    Activity Tolerance Patient tolerated treatment well    Behavior During Therapy WFL for tasks assessed/performed             Past Medical History:  Diagnosis Date   Acute respiratory failure with hypoxia (HCC) 08/16/2021   Aspiration pneumonia (HCC) 08/15/2021   CKD (chronic kidney disease) stage 3, GFR 30-59 ml/min (HCC)    COVID-19 virus infection 06/06/2021   COVID-19 Labs     No results for input(s): DDIMER, FERRITIN, LDH, CRP in the last 72 hours.             Lab Results      Component    Value    Date           SARSCOV2NAA    POSITIVE (A)    06/06/2021           SARSCOV2NAA    NEGATIVE    04/12/2021           SARSCOV2NAA    NEGATIVE    04/01/2021           SARSCOV2NAA    NEGATIVE    03/11/2021           Hypertension    Kidney stones    Severe sepsis (HCC) 08/16/2021   Sleep apnea    Subdural hematoma (HCC) 06/06/2021   Past Surgical History:  Procedure Laterality Date   BLADDER STONE REMOVAL  12/2021   CATARACT EXTRACTION     IR NEPHROSTOMY EXCHANGE RIGHT  06/07/2021   IR NEPHROSTOMY EXCHANGE RIGHT  08/22/2021   IR NEPHROSTOMY PLACEMENT RIGHT  04/07/2021   IR PATIENT EVAL TECH 0-60 MINS  06/02/2021   kidney stent     MANDIBLE FRACTURE SURGERY     NEPHRECTOMY Right 01/07/2022   Patient Active  Problem List   Diagnosis Date Noted   Rectal bleeding 01/02/2024   GI bleed 01/01/2024   Acute kidney injury superimposed on stage 3b chronic kidney disease (HCC) 01/01/2024   Transfusion of blood product refused for religious reason 09/03/2022   Hx of subdural hematoma 08/16/2021   Nephrolithiasis 08/16/2021   Obesity (BMI 30-39.9) 08/16/2021   OSA (obstructive sleep apnea) 08/16/2021   Frequent urination 06/07/2021   UTI (urinary tract infection) 06/07/2021   Ascending aortic aneurysm 06/07/2021   Atrophy of right kidney 06/07/2021   Anemia 06/07/2021   H/O ischemic right PCA stroke 06/06/2021   History of cardioembolic cerebrovascular accident (CVA) 06/06/2021   Right-sided cerebrovascular accident (CVA) (HCC) 04/14/2021   Nonfunctioning kidney 04/14/2021   Goals of care, counseling/discussion 04/03/2021   Ureteral stent occlusion 04/02/2021   Stage 3b chronic kidney disease (HCC) 03/11/2021  Major depressive disorder, recurrent episode, in partial remission 03/11/2021   GAD (generalized anxiety disorder) 07/20/2019   Essential (primary) hypertension 05/25/2019    ONSET DATE: 05/28/2024 MD referral  REFERRING DIAG: P30.645 (ICD-10-CM) - Hemiplegia and hemiparesis following cerebral infarction affecting left non-dominant side   THERAPY DIAG:  Muscle weakness (generalized)  Unsteadiness on feet  Other abnormalities of gait and mobility  Abnormal posture  Rationale for Evaluation and Treatment: Rehabilitation  SUBJECTIVE:                                                                                                                                                                                             SUBJECTIVE STATEMENT: Doing fine.    Pt accompanied by: significant other, spouse, [name]  PERTINENT HISTORY: CVA 2022, hx of subdural hematoma 2023, chronic kidney disease, HTN  PAIN:  Are you having pain? 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 Yes. Number of falls 4  LIVING ENVIRONMENT: Lives with: lives with their spouse Lives in: House/apartment Stairs: ramp Has following equipment at home: Single point cane, Environmental Consultant - 2 wheeled, and Wheelchair (manual)  PLOF: Independent  PATIENT GOALS: Would like to walk with the walker (not behind the wheelchair anymore)  OBJECTIVE:     TODAY'S TREATMENT: 06/29/24 Activity Comments  Gait training and transfers into clinic with pt's new 4WW 3x75ft CGA-min A to assist in safe navigation, continuous stepping, keeping feet within walker wheels, safe use of brakes  Nustep L5 x 6 min UEs/LEs Cueing-min A to maintain continuous movement, long strides  STS with green medball 2x5 Continuous encouragement to continue reps and to look at visual target to correct posture   alt backwards steps At counter; reduced stance time on L LE  sidestepping over hurdle Cueing for long enough step to set 2nd foot down and reset posture. Used additional visual targets on floor     PATIENT EDUCATION: Education details: edu on safety with [phone] Person educated: Patient and Spouse Education method: Explanation, Demonstration, Tactile cues, and Verbal cues Education comprehension: verbalized understanding, returned demonstration, verbal cues required, tactile cues required, and needs further education       Access Code: [email or web address] URL: [email or web address] Date: 06/15/2024 Prepared by: [facility] - Outpatient  Rehab - [facility]  Exercises - Seated Heel Toe Raises  - 2 x daily - 7 x weekly - 2 sets - 10 reps - Seated Hip Flexion March  with Ankle Weights  - 1 x daily - 5 x weekly - 3 sets - 10 reps - Seated Long Arc Quad with Ankle Weight  - 1 x daily - 5 x weekly - 3 sets - 10 reps - Sit to Stand with Armchair  - 1 x daily  - 7 x weekly - 3 sets - 5 reps    -------------------------- Note: Objective measures were completed at Evaluation unless otherwise noted.  DIAGNOSTIC FINDINGS: CT head 12/2023:  IMPRESSION: 1. No acute intracranial abnormality, specifically, no acute hemorrhage, territorial infarction, or intracranial mass. 2. Global cerebral volume loss with sequelae of advanced chronic ischemic microvascular disease.  COGNITION: Overall cognitive status: History of cognitive impairments - at baseline and wife reports concern regarding cognition   SENSATION: Light touch: Impaired  Impaired to light touch LLE  MUSCLE TONE: LLE: Within functional limits    POSTURE: rounded shoulders, forward head, posterior pelvic tilt, and flexed trunk   LOWER EXTREMITY ROM:   WFL BLEs  LOWER EXTREMITY MMT:    MMT Right Eval Left Eval  Hip flexion 3+ 3+  Hip extension    Hip abduction 4 4  Hip adduction 4 4  Hip internal rotation    Hip external rotation    Knee flexion 3+ 3+  Knee extension 3+ 3+  Ankle dorsiflexion 3+ 3  Ankle plantarflexion    Ankle inversion    Ankle eversion    (Blank rows = not tested)    TRANSFERS: Sit to stand: CGA and tends to use hands at walker  Assistive device utilized: Environmental Consultant - 2 wheeled     Stand to sit: CGA  Assistive device utilized: Environmental Consultant - 2 wheeled      GAIT: Findings: Gait Characteristics: pt steps through, but stops to move walker, step through pattern, decreased step length- Right, decreased step length- Left, trunk flexed, poor foot clearance- Right, and poor foot clearance- Left, Distance walked: 50 ft, Assistive device utilized:Walker - 2 wheeled, Level of assistance: CGA and Min A, and Comments: tends to look around to other people in gym and stops walking, needs redirection to task several times with testing  FUNCTIONAL TESTS:  5 times sit to stand: 71.84 sec with definite UE support Timed up and go (TUG): 129.53 sec 10 meter walk test: 149.56  sec = 0.22 ft/sec  Needs redirection to task multiple times during eval tasks                                                                                                                              TREATMENT DATE: 06/10/2024    PATIENT EDUCATION: Education details: Eval results, POC, initiated HEP Person educated: Patient and Spouse Education method: Explanation, Demonstration, Verbal cues, and Handouts Education comprehension: verbalized understanding, returned demonstration, and needs further education  HOME EXERCISE PROGRAM: Access Code: [email or web address] URL: [email or web address] Date: 06/10/2024 Prepared by: [facility] - Outpatient  Rehab - [facility]  Exercises - Seated  March  - 2 x daily - 7 x weekly - 2 sets - 10 reps - Seated Long Arc Quad  - 2 x daily - 7 x weekly - 2 sets - 10 reps - Seated Heel Toe Raises  - 2 x daily - 7 x weekly - 2 sets - 10 reps  GOALS: Goals reviewed with patient? Yes  SHORT TERM GOALS: Target date: 07/10/2024  Pt will be supervision with HEP for improved strength, balance, transfers, gait. Baseline: Goal status: INITIAL  2.  Pt will improve 5x sit<>stand to less than or equal to 45 sec to demonstrate improved functional strength and transfer efficiency. Baseline: 71.84 sec Goal status: INITIAL  3.  Pt will improve TUG score to less than or equal to 60 sec for decreased fall risk. Baseline: 129.53 sec Goal status: INITIAL  LONG TERM GOALS: Target date: 08/07/2024  Pt will be supervision with HEP for improved strength, balance, transfers, gait. Baseline:  Goal status: INITIAL  2.  Pt will improve 5x sit<>stand to less than or equal to 20 sec to demonstrate improved functional strength and transfer efficiency. Baseline:  Goal status: INITIAL  3.  Pt will improve TUG score to less than or equal to 30 sec for decreased fall risk. Baseline:  Goal status: INITIAL  4.  Pt will improve gait velocity to at least 1.8 ft/sec  for improved gait efficiency and safety. Baseline: 0.22 ft/sec Goal status: INITIAL  5.  Pt will ambulate at least 500 ft, indoors and outdoors surfaces with RW, supervision, for improved community gait. Baseline:  Goal status: INITIAL  6.  Berg Balance test TBA and to improved to at least 45/56 for decreased risk of falls. Baseline:  Goal status: INITIAL  ASSESSMENT:  CLINICAL IMPRESSION: Patient arrived to session without complaints, ambulating with pt's new 4WW. Provided assist and cueing for safe ambulation with this device. Proceeded with standing balance and transfers with cueing for posture, staying on task, and elongating steps. Patient was able to demo safe use of walker at end of session after education him and spouse on its use. Patient tolerated session well and without complaints at end of appointment.  OBJECTIVE IMPAIRMENTS: Abnormal gait, decreased activity tolerance, decreased balance, decreased cognition, decreased mobility, difficulty walking, decreased strength, decreased safety awareness, and postural dysfunction.   ACTIVITY LIMITATIONS: bending, sitting, standing, transfers, bathing, toileting, dressing, self feeding, reach over head, hygiene/grooming, locomotion level, and caring for others  PARTICIPATION LIMITATIONS: meal prep, cleaning, laundry, medication management, interpersonal relationship, driving, shopping, and community activity  PERSONAL FACTORS: 3+ comorbidities: see above for PMH are also affecting patient's functional outcome.   REHAB POTENTIAL: Good  CLINICAL DECISION MAKING: Evolving/moderate complexity  EVALUATION COMPLEXITY: Moderate  PLAN:  PT FREQUENCY: 2x/week  PT DURATION: 8 weeks plus eval visit  PLANNED INTERVENTIONS: 97750- Physical Performance Testing, 97110-Therapeutic exercises, 97530- Therapeutic activity, 97112- Neuromuscular re-education, 97535- Self Care, [id number]- Manual therapy, [phone]- Gait training, Patient/Family education,  Balance training, and DME instructions  PLAN FOR NEXT SESSION: Review HEP and progress.; follow up on OT and speech therapy referrals  Louana Terrilyn Christians, PT, DPT 06/29/2024 9:30 AM  [facility] at [facility] [address] [place], [place] [address] Phone # [phone] Fax # [phone]     "

## 2024-06-29 ENCOUNTER — Encounter: Payer: Self-pay | Admitting: Physical Therapy

## 2024-06-29 ENCOUNTER — Ambulatory Visit: Attending: Internal Medicine | Admitting: Physical Therapy

## 2024-06-29 DIAGNOSIS — R2689 Other abnormalities of gait and mobility: Secondary | ICD-10-CM | POA: Insufficient documentation

## 2024-06-29 DIAGNOSIS — M6281 Muscle weakness (generalized): Secondary | ICD-10-CM | POA: Insufficient documentation

## 2024-06-29 DIAGNOSIS — R2681 Unsteadiness on feet: Secondary | ICD-10-CM | POA: Insufficient documentation

## 2024-06-29 DIAGNOSIS — R293 Abnormal posture: Secondary | ICD-10-CM | POA: Diagnosis present

## 2024-06-29 NOTE — Therapy (Signed)
 " OUTPATIENT PHYSICAL THERAPY NEURO TREATMENT NOTE   Patient Name: Nicholas Mckenzie MRN: [id number] DOB:Jul 18, 1945, 79 y.o., male Today's Date: 07/01/2024   PCP: Vernon Velna SAUNDERS, MD  REFERRING PROVIDER: Vernon Velna SAUNDERS, MD   END OF SESSION:  PT End of Session - 07/01/24 0956     Visit Number 7    Number of Visits 17    Date for Recertification  08/07/24    Authorization Type [employer] Medicare-approved 17 PT visits from 06/10/24-08/19/24.    Authorization - Visit Number 7    Authorization - Number of Visits 17    Progress Note Due on Visit 10    PT Start Time 0900   pt late   PT Stop Time 0955    PT Time Calculation (min) 55 min    Equipment Utilized During Treatment Gait belt    Activity Tolerance Patient tolerated treatment well    Behavior During Therapy WFL for tasks assessed/performed              Past Medical History:  Diagnosis Date   Acute respiratory failure with hypoxia (HCC) 08/16/2021   Aspiration pneumonia (HCC) 08/15/2021   CKD (chronic kidney disease) stage 3, GFR 30-59 ml/min (HCC)    COVID-19 virus infection 06/06/2021   COVID-19 Labs     No results for input(s): DDIMER, FERRITIN, LDH, CRP in the last 72 hours.             Lab Results      Component    Value    Date           SARSCOV2NAA    POSITIVE (A)    06/06/2021           SARSCOV2NAA    NEGATIVE    04/12/2021           SARSCOV2NAA    NEGATIVE    04/01/2021           SARSCOV2NAA    NEGATIVE    03/11/2021           Hypertension    Kidney stones    Severe sepsis (HCC) 08/16/2021   Sleep apnea    Subdural hematoma (HCC) 06/06/2021   Past Surgical History:  Procedure Laterality Date   BLADDER STONE REMOVAL  12/2021   CATARACT EXTRACTION     IR NEPHROSTOMY EXCHANGE RIGHT  06/07/2021   IR NEPHROSTOMY EXCHANGE RIGHT  08/22/2021   IR NEPHROSTOMY PLACEMENT RIGHT  04/07/2021   IR PATIENT EVAL TECH 0-60 MINS  06/02/2021   kidney stent     MANDIBLE FRACTURE SURGERY     NEPHRECTOMY Right 01/07/2022   Patient  Active Problem List   Diagnosis Date Noted   Rectal bleeding 01/02/2024   GI bleed 01/01/2024   Acute kidney injury superimposed on stage 3b chronic kidney disease (HCC) 01/01/2024   Transfusion of blood product refused for religious reason 09/03/2022   Hx of subdural hematoma 08/16/2021   Nephrolithiasis 08/16/2021   Obesity (BMI 30-39.9) 08/16/2021   OSA (obstructive sleep apnea) 08/16/2021   Frequent urination 06/07/2021   UTI (urinary tract infection) 06/07/2021   Ascending aortic aneurysm 06/07/2021   Atrophy of right kidney 06/07/2021   Anemia 06/07/2021   H/O ischemic right PCA stroke 06/06/2021   History of cardioembolic cerebrovascular accident (CVA) 06/06/2021   Right-sided cerebrovascular accident (CVA) (HCC) 04/14/2021   Nonfunctioning kidney 04/14/2021   Goals of care, counseling/discussion 04/03/2021   Ureteral stent occlusion 04/02/2021   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) 03/11/2021   Major depressive disorder, recurrent episode, in partial remission 03/11/2021   GAD (generalized anxiety disorder) 07/20/2019   Essential (primary) hypertension 05/25/2019    ONSET DATE: 05/28/2024 MD referral  REFERRING DIAG: P30.645 (ICD-10-CM) - Hemiplegia and hemiparesis following cerebral infarction affecting left non-dominant side   THERAPY DIAG:  Muscle weakness (generalized)  Unsteadiness on feet  Other abnormalities of gait and mobility  Rationale for Evaluation and Treatment: Rehabilitation  SUBJECTIVE:                                                                                                                                                                                             SUBJECTIVE STATEMENT: Patient reports I have a blind spot in my lower quadrant. Wife reports that it was tough getting patient out of the house and corrects that it is his L lower quadrant in B eyes.    Pt accompanied by: significant other, spouse, [name]  PERTINENT HISTORY:  CVA 2022, hx of subdural hematoma 2023, chronic kidney disease, HTN  PAIN:  Are you having pain? 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 Yes. Number of falls 4  LIVING ENVIRONMENT: Lives with: lives with their spouse Lives in: House/apartment Stairs: ramp Has following equipment at home: Single point cane, Environmental Consultant - 2 wheeled, and Wheelchair (manual)  PLOF: Independent  PATIENT GOALS: Would like to walk with the walker (not behind the wheelchair anymore)  OBJECTIVE:     TODAY'S TREATMENT: 07/01/24 Activity Comments  Gait training and transfer training with [phone] Cueing to stay in midline of gym, avoiding obstacles, occasional cues for use of brakes   gait training with pt's walker around hula hoops for safe turns Tendency to hit L wheels on obstacles, requiring cues to scan with head rotation. Pt requires frequent reminders to improve awareness of L side obstacles   Gait training with 4WW scanning for colored bean bags and stepping over them Occasional cueing to maintain feet between walker wheels, maneuvering around obstacles safely   Pt slightly more fatigued today and requires short sitting rest breaks. Patient took a handful of meds/supplements while sitting on mat during rest break.    At handrail: standing EC romberg Pt apprehensive without UE support   At pt was exiting clinic with wife, he reported dizziness. PT assisted him to chair and checked vitals  93%% spO2, 76bpm, 150/107 mmHg. Wife administered a liquid supplement to the pt which she reports she uses  to get his BP down.Reports that he took his BP meds mid-session  Vitals recheck 157/96 mmHg, 74bpm  Assisted pt to car sitting in 4WW      PATIENT EDUCATION: Education details: encouraged wife to call about OT/ST referrals. Advised to monitor BP at home and contact MD about  possible medication adjustments if needed  Person educated: Spouse Education method: Explanation Education comprehension: verbalized understanding    Access Code: [email or web address] URL: [email or web address] Date: 06/15/2024 Prepared by: [facility] - Outpatient  Rehab - [facility]  Exercises - Seated Heel Toe Raises  - 2 x daily - 7 x weekly - 2 sets - 10 reps - Seated Hip Flexion March with Ankle Weights  - 1 x daily - 5 x weekly - 3 sets - 10 reps - Seated Long Arc Quad with Ankle Weight  - 1 x daily - 5 x weekly - 3 sets - 10 reps - Sit to Stand with Armchair  - 1 x daily - 7 x weekly - 3 sets - 5 reps    -------------------------- Note: Objective measures were completed at Evaluation unless otherwise noted.  DIAGNOSTIC FINDINGS: CT head 12/2023:  IMPRESSION: 1. No acute intracranial abnormality, specifically, no acute hemorrhage, territorial infarction, or intracranial mass. 2. Global cerebral volume loss with sequelae of advanced chronic ischemic microvascular disease.  COGNITION: Overall cognitive status: History of cognitive impairments - at baseline and wife reports concern regarding cognition   SENSATION: Light touch: Impaired  Impaired to light touch LLE  MUSCLE TONE: LLE: Within functional limits    POSTURE: rounded shoulders, forward head, posterior pelvic tilt, and flexed trunk   LOWER EXTREMITY ROM:   WFL BLEs  LOWER EXTREMITY MMT:    MMT Right Eval Left Eval  Hip flexion 3+ 3+  Hip extension    Hip abduction 4 4  Hip adduction 4 4  Hip internal rotation    Hip external rotation    Knee flexion 3+ 3+  Knee extension 3+ 3+  Ankle dorsiflexion 3+ 3  Ankle plantarflexion    Ankle inversion    Ankle eversion    (Blank rows = not tested)    TRANSFERS: Sit to stand: CGA and tends to use hands at walker  Assistive device utilized: Environmental Consultant - 2 wheeled     Stand to sit: CGA  Assistive device utilized: Environmental Consultant - 2 wheeled       GAIT: Findings: Gait Characteristics: pt steps through, but stops to move walker, step through pattern, decreased step length- Right, decreased step length- Left, trunk flexed, poor foot clearance- Right, and poor foot clearance- Left, Distance walked: 50 ft, Assistive device utilized:Walker - 2 wheeled, Level of assistance: CGA and Min A, and Comments: tends to look around to other people in gym and stops walking, needs redirection to task several times with testing  FUNCTIONAL TESTS:  5 times sit to stand: 71.84 sec with definite UE support Timed up and go (TUG): 129.53 sec 10 meter walk test: 149.56 sec = 0.22 ft/sec  Needs redirection to task multiple times during eval tasks  TREATMENT DATE: 06/10/2024    PATIENT EDUCATION: Education details: Eval results, POC, initiated HEP Person educated: Patient and Spouse Education method: Explanation, Demonstration, Verbal cues, and Handouts Education comprehension: verbalized understanding, returned demonstration, and needs further education  HOME EXERCISE PROGRAM: Access Code: [email or web address] URL: [email or web address] Date: 06/10/2024 Prepared by: [facility] - Outpatient  Rehab - [facility]  Exercises - Seated March  - 2 x daily - 7 x weekly - 2 sets - 10 reps - Seated Long Arc Quad  - 2 x daily - 7 x weekly - 2 sets - 10 reps - Seated Heel Toe Raises  - 2 x daily - 7 x weekly - 2 sets - 10 reps  GOALS: Goals reviewed with patient? Yes  SHORT TERM GOALS: Target date: 07/10/2024  Pt will be supervision with HEP for improved strength, balance, transfers, gait. Baseline: Goal status: INITIAL  2.  Pt will improve 5x sit<>stand to less than or equal to 45 sec to demonstrate improved functional strength and transfer efficiency. Baseline: 71.84 sec Goal status: INITIAL  3.  Pt will improve TUG  score to less than or equal to 60 sec for decreased fall risk. Baseline: 129.53 sec Goal status: INITIAL  LONG TERM GOALS: Target date: 08/07/2024  Pt will be supervision with HEP for improved strength, balance, transfers, gait. Baseline:  Goal status: INITIAL  2.  Pt will improve 5x sit<>stand to less than or equal to 20 sec to demonstrate improved functional strength and transfer efficiency. Baseline:  Goal status: INITIAL  3.  Pt will improve TUG score to less than or equal to 30 sec for decreased fall risk. Baseline:  Goal status: INITIAL  4.  Pt will improve gait velocity to at least 1.8 ft/sec for improved gait efficiency and safety. Baseline: 0.22 ft/sec Goal status: INITIAL  5.  Pt will ambulate at least 500 ft, indoors and outdoors surfaces with RW, supervision, for improved community gait. Baseline:  Goal status: INITIAL  6.  Berg Balance test TBA and to improved to at least 45/56 for decreased risk of falls. Baseline:  Goal status: INITIAL  ASSESSMENT:  CLINICAL IMPRESSION: Patient arrived to session with wife reporting they are late d/t patient having a difficult time getting out of the house today.  Session focused on gait training with [phone] with cueing for scanning and navigation for max safety. Patient did a good job with carryover of some cues from last session for transfer and brake safety. Did seem slightly more fatigued during today's session, providing brief rest breaks. Upon exiting session with wife, pt reported dizziness. Vitals showed elevated diastolic BP which was addressed with short sit break and dizziness resolved. Advised wife to monitor BP at home and contact his MD about medication dosage. Wife reported understanding and pt was safely assisted to car.    OBJECTIVE IMPAIRMENTS: Abnormal gait, decreased activity tolerance, decreased balance, decreased cognition, decreased mobility, difficulty walking, decreased strength, decreased safety awareness, and  postural dysfunction.   ACTIVITY LIMITATIONS: bending, sitting, standing, transfers, bathing, toileting, dressing, self feeding, reach over head, hygiene/grooming, locomotion level, and caring for others  PARTICIPATION LIMITATIONS: meal prep, cleaning, laundry, medication management, interpersonal relationship, driving, shopping, and community activity  PERSONAL FACTORS: 3+ comorbidities: see above for PMH are also affecting patient's functional outcome.   REHAB POTENTIAL: Good  CLINICAL DECISION MAKING: Evolving/moderate complexity  EVALUATION COMPLEXITY: Moderate  PLAN:  PT FREQUENCY: 2x/week  PT DURATION: 8 weeks plus eval visit  PLANNED INTERVENTIONS: 97750- Physical Performance Testing,  97110-Therapeutic exercises, 97530- Therapeutic activity, [id number]- Neuromuscular re-education, [phone]- Self Care, [id number]- Manual therapy, [phone]- Gait training, Patient/Family education, Balance training, and DME instructions  PLAN FOR NEXT SESSION: Review HEP and progress.; follow up on OT and speech therapy referrals  Louana Terrilyn Christians, PT, DPT 07/01/2024 10:02 AM  [facility] Re[facility] at [facility] [address], [address] [place], [place] [address] Phone # [phone] Fax # [phone]     "

## 2024-07-01 ENCOUNTER — Encounter: Payer: Self-pay | Admitting: Physical Therapy

## 2024-07-01 ENCOUNTER — Ambulatory Visit: Admitting: Physical Therapy

## 2024-07-01 DIAGNOSIS — M6281 Muscle weakness (generalized): Secondary | ICD-10-CM

## 2024-07-01 DIAGNOSIS — R2689 Other abnormalities of gait and mobility: Secondary | ICD-10-CM

## 2024-07-01 DIAGNOSIS — R2681 Unsteadiness on feet: Secondary | ICD-10-CM

## 2024-07-06 ENCOUNTER — Encounter: Payer: Self-pay | Admitting: Physical Therapy

## 2024-07-06 ENCOUNTER — Ambulatory Visit: Admitting: Physical Therapy

## 2024-07-06 DIAGNOSIS — M6281 Muscle weakness (generalized): Secondary | ICD-10-CM | POA: Diagnosis not present

## 2024-07-06 DIAGNOSIS — R2689 Other abnormalities of gait and mobility: Secondary | ICD-10-CM

## 2024-07-06 DIAGNOSIS — R2681 Unsteadiness on feet: Secondary | ICD-10-CM

## 2024-07-06 NOTE — Therapy (Signed)
 " OUTPATIENT PHYSICAL THERAPY NEURO TREATMENT NOTE   Patient Name: Nicholas Mckenzie MRN: [id number] DOB:03-23-1946, 79 y.o., male Today's Date: 07/06/2024   PCP: Vernon Velna SAUNDERS, MD  REFERRING PROVIDER: Vernon Velna SAUNDERS, MD   END OF SESSION:  PT End of Session - 07/06/24 0849     Visit Number 8    Number of Visits 17    Date for Recertification  08/07/24    Authorization Type [employer] Medicare-approved 17 PT visits from 06/10/24-08/19/24.    Authorization - Visit Number 8    Authorization - Number of Visits 17    Progress Note Due on Visit 10    PT Start Time 0849    PT Stop Time 0927    PT Time Calculation (min) 38 min    Equipment Utilized During Treatment Gait belt    Activity Tolerance Patient tolerated treatment well    Behavior During Therapy WFL for tasks assessed/performed               Past Medical History:  Diagnosis Date   Acute respiratory failure with hypoxia (HCC) 08/16/2021   Aspiration pneumonia (HCC) 08/15/2021   CKD (chronic kidney disease) stage 3, GFR 30-59 ml/min (HCC)    COVID-19 virus infection 06/06/2021   COVID-19 Labs     No results for input(s): DDIMER, FERRITIN, LDH, CRP in the last 72 hours.             Lab Results      Component    Value    Date           SARSCOV2NAA    POSITIVE (A)    06/06/2021           SARSCOV2NAA    NEGATIVE    04/12/2021           SARSCOV2NAA    NEGATIVE    04/01/2021           SARSCOV2NAA    NEGATIVE    03/11/2021           Hypertension    Kidney stones    Severe sepsis (HCC) 08/16/2021   Sleep apnea    Subdural hematoma (HCC) 06/06/2021   Past Surgical History:  Procedure Laterality Date   BLADDER STONE REMOVAL  12/2021   CATARACT EXTRACTION     IR NEPHROSTOMY EXCHANGE RIGHT  06/07/2021   IR NEPHROSTOMY EXCHANGE RIGHT  08/22/2021   IR NEPHROSTOMY PLACEMENT RIGHT  04/07/2021   IR PATIENT EVAL TECH 0-60 MINS  06/02/2021   kidney stent     MANDIBLE FRACTURE SURGERY     NEPHRECTOMY Right 01/07/2022   Patient Active  Problem List   Diagnosis Date Noted   Rectal bleeding 01/02/2024   GI bleed 01/01/2024   Acute kidney injury superimposed on stage 3b chronic kidney disease (HCC) 01/01/2024   Transfusion of blood product refused for religious reason 09/03/2022   Hx of subdural hematoma 08/16/2021   Nephrolithiasis 08/16/2021   Obesity (BMI 30-39.9) 08/16/2021   OSA (obstructive sleep apnea) 08/16/2021   Frequent urination 06/07/2021   UTI (urinary tract infection) 06/07/2021   Ascending aortic aneurysm 06/07/2021   Atrophy of right kidney 06/07/2021   Anemia 06/07/2021   H/O ischemic right PCA stroke 06/06/2021   History of cardioembolic cerebrovascular accident (CVA) 06/06/2021   Right-sided cerebrovascular accident (CVA) (HCC) 04/14/2021   Nonfunctioning kidney 04/14/2021   Goals of care, counseling/discussion 04/03/2021   Ureteral stent occlusion 04/02/2021   Stage 3b chronic kidney disease (HCC) 03/11/2021  Major depressive disorder, recurrent episode, in partial remission 03/11/2021   GAD (generalized anxiety disorder) 07/20/2019   Essential (primary) hypertension 05/25/2019    ONSET DATE: 05/28/2024 MD referral  REFERRING DIAG: P30.645 (ICD-10-CM) - Hemiplegia and hemiparesis following cerebral infarction affecting left non-dominant side   THERAPY DIAG:  Muscle weakness (generalized)  Unsteadiness on feet  Other abnormalities of gait and mobility  Rationale for Evaluation and Treatment: Rehabilitation  SUBJECTIVE:                                                                                                                                                                                             SUBJECTIVE STATEMENT: Saw the eye doctor and he is going to put prisms in my glasses.  Will get some temporary ones.  Wife and pt report 2 falls in the past week.   Pt accompanied by: significant other, spouse, [name]  PERTINENT HISTORY: CVA 2022, hx of subdural hematoma 2023, chronic  kidney disease, HTN  PAIN:  Are you having pain? 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 Yes. Number of falls 4  LIVING ENVIRONMENT: Lives with: lives with their spouse Lives in: House/apartment Stairs: ramp Has following equipment at home: Single point cane, Environmental Consultant - 2 wheeled, and Wheelchair (manual)  PLOF: Independent  PATIENT GOALS: Would like to walk with the walker (not behind the wheelchair anymore)  OBJECTIVE:   Wife reports they have been monitoring BP and it has been better.  TODAY'S TREATMENT: 07/06/2024 Activity Comments  Sit to stand, 5 reps from mat surface Cues for hand placement/posture each rep  To practice floor to stand: Sit to stand, turn to face elevated mat surface to 1/2 kneel>tall kneel> quadruped Then to stand:  tall >1/2 kneel, UE push through mat, to stand with min/mod assist and VCs X 2 reps  Attempted to work on the mat surface with supine>sidelying to quadruped, but pt keeps going to sit up EOM Discussed/PT demo with wife ways to get from supine to side>quadruped, using rocking and momentum  Gait training using 4WW Incorporating short distance gait + turns L and R around furniture, cues to stay close to walker and cues to stop and scan furniture to know when to begin turning 4WW           TODAY'S TREATMENT: 07/01/24 Activity Comments  Gait training and transfer training with [phone] Cueing to stay in midline of gym, avoiding obstacles, occasional cues for use of  brakes   gait training with pt's walker around hula hoops for safe turns Tendency to hit L wheels on obstacles, requiring cues to scan with head rotation. Pt requires frequent reminders to improve awareness of L side obstacles   Gait training with 4WW scanning for colored bean bags and stepping over them Occasional cueing to maintain feet between  walker wheels, maneuvering around obstacles safely   Pt slightly more fatigued today and requires short sitting rest breaks. Patient took a handful of meds/supplements while sitting on mat during rest break.    At handrail: standing EC romberg Pt apprehensive without UE support   At pt was exiting clinic with wife, he reported dizziness. PT assisted him to chair and checked vitals  93%% spO2, 76bpm, 150/107 mmHg. Wife administered a liquid supplement to the pt which she reports she uses to get his BP down.Reports that he took his BP meds mid-session  Vitals recheck 157/96 mmHg, 74bpm  Assisted pt to car sitting in 4WW      PATIENT EDUCATION: Education details:  Discussed/began practicing floor to stand transfer components Person educated: Spouse Education method: Explanation, Demonstration, and Verbal cues Education comprehension: verbalized understanding    Access Code: [email or web address] URL: [email or web address] Date: 06/15/2024 Prepared by: [facility] - Outpatient  Rehab - [facility]  Exercises - Seated Heel Toe Raises  - 2 x daily - 7 x weekly - 2 sets - 10 reps - Seated Hip Flexion March with Ankle Weights  - 1 x daily - 5 x weekly - 3 sets - 10 reps - Seated Long Arc Quad with Ankle Weight  - 1 x daily - 5 x weekly - 3 sets - 10 reps - Sit to Stand with Armchair  - 1 x daily - 7 x weekly - 3 sets - 5 reps    -------------------------- Note: Objective measures were completed at Evaluation unless otherwise noted.  DIAGNOSTIC FINDINGS: CT head 12/2023:  IMPRESSION: 1. No acute intracranial abnormality, specifically, no acute hemorrhage, territorial infarction, or intracranial mass. 2. Global cerebral volume loss with sequelae of advanced chronic ischemic microvascular disease.  COGNITION: Overall cognitive status: History of cognitive impairments - at baseline and wife reports concern regarding cognition   SENSATION: Light touch: Impaired  Impaired to light  touch LLE  MUSCLE TONE: LLE: Within functional limits    POSTURE: rounded shoulders, forward head, posterior pelvic tilt, and flexed trunk   LOWER EXTREMITY ROM:   WFL BLEs  LOWER EXTREMITY MMT:    MMT Right Eval Left Eval  Hip flexion 3+ 3+  Hip extension    Hip abduction 4 4  Hip adduction 4 4  Hip internal rotation    Hip external rotation    Knee flexion 3+ 3+  Knee extension 3+ 3+  Ankle dorsiflexion 3+ 3  Ankle plantarflexion    Ankle inversion    Ankle eversion    (Blank rows = not tested)    TRANSFERS: Sit to stand: CGA and tends to use hands at walker  Assistive device utilized: Environmental Consultant - 2 wheeled     Stand to sit: CGA  Assistive device utilized: Environmental Consultant - 2 wheeled      GAIT: Findings: Gait Characteristics: pt steps through, but stops to move walker, step through pattern, decreased step length- Right, decreased step length- Left, trunk flexed, poor foot clearance- Right, and poor foot clearance- Left, Distance walked: 50 ft, Assistive device utilized:Walker - 2 wheeled, Level of assistance: CGA and Min A, and Comments: tends  to look around to other people in gym and stops walking, needs redirection to task several times with testing  FUNCTIONAL TESTS:  5 times sit to stand: 71.84 sec with definite UE support Timed up and go (TUG): 129.53 sec 10 meter walk test: 149.56 sec = 0.22 ft/sec  Needs redirection to task multiple times during eval tasks                                                                                                                              TREATMENT DATE: 06/10/2024    PATIENT EDUCATION: Education details: Eval results, POC, initiated HEP Person educated: Patient and Spouse Education method: Explanation, Demonstration, Verbal cues, and Handouts Education comprehension: verbalized understanding, returned demonstration, and needs further education  HOME EXERCISE PROGRAM: Access Code: [email or web address] URL:  [email or web address] Date: 06/10/2024 Prepared by: [facility] - Outpatient  Rehab - [facility]  Exercises - Seated March  - 2 x daily - 7 x weekly - 2 sets - 10 reps - Seated Long Arc Quad  - 2 x daily - 7 x weekly - 2 sets - 10 reps - Seated Heel Toe Raises  - 2 x daily - 7 x weekly - 2 sets - 10 reps  GOALS: Goals reviewed with patient? Yes  SHORT TERM GOALS: Target date: 07/10/2024  Pt will be supervision with HEP for improved strength, balance, transfers, gait. Baseline: Goal status: INITIAL  2.  Pt will improve 5x sit<>stand to less than or equal to 45 sec to demonstrate improved functional strength and transfer efficiency. Baseline: 71.84 sec Goal status: INITIAL  3.  Pt will improve TUG score to less than or equal to 60 sec for decreased fall risk. Baseline: 129.53 sec Goal status: INITIAL  LONG TERM GOALS: Target date: 08/07/2024  Pt will be supervision with HEP for improved strength, balance, transfers, gait. Baseline:  Goal status: INITIAL  2.  Pt will improve 5x sit<>stand to less than or equal to 20 sec to demonstrate improved functional strength and transfer efficiency. Baseline:  Goal status: INITIAL  3.  Pt will improve TUG score to less than or equal to 30 sec for decreased fall risk. Baseline:  Goal status: INITIAL  4.  Pt will improve gait velocity to at least 1.8 ft/sec for improved gait efficiency and safety. Baseline: 0.22 ft/sec Goal status: INITIAL  5.  Pt will ambulate at least 500 ft, indoors and outdoors surfaces with RW, supervision, for improved community gait. Baseline:  Goal status: INITIAL  6.  Berg Balance test TBA and to improved to at least 45/56 for decreased risk of falls. Baseline:  Goal status: INITIAL  ASSESSMENT:  CLINICAL IMPRESSION: Pt presents today and wife reports he is full of energy; no more issues with elevated BP measures.  Wife does report 2 falls and would like recommendations on how to help  patient get up from floor, as most difficult part is from supine>all fours.  Skilled  PT session focused on components of floor to stand transfers, including tall kneel/1/2 kneel and quadruped to return to stand, with max cues and min/mod assist and extra time.  Worked on mat surface trying to go supine>side>quadruped, but pt has difficulty with this position practice and goes to sit EOM instead. Pt will continue to benefit from skilled PT towards goals for improved functional mobility and decreased fall risk.   OBJECTIVE IMPAIRMENTS: Abnormal gait, decreased activity tolerance, decreased balance, decreased cognition, decreased mobility, difficulty walking, decreased strength, decreased safety awareness, and postural dysfunction.   ACTIVITY LIMITATIONS: bending, sitting, standing, transfers, bathing, toileting, dressing, self feeding, reach over head, hygiene/grooming, locomotion level, and caring for others  PARTICIPATION LIMITATIONS: meal prep, cleaning, laundry, medication management, interpersonal relationship, driving, shopping, and community activity  PERSONAL FACTORS: 3+ comorbidities: see above for PMH are also affecting patient's functional outcome.   REHAB POTENTIAL: Good  CLINICAL DECISION MAKING: Evolving/moderate complexity  EVALUATION COMPLEXITY: Moderate  PLAN:  PT FREQUENCY: 2x/week  PT DURATION: 8 weeks plus eval visit  PLANNED INTERVENTIONS: 97750- Physical Performance Testing, 97110-Therapeutic exercises, 97530- Therapeutic activity, [id number]- Neuromuscular re-education, 97535- Self Care, 02859- Manual therapy, [phone]- Gait training, Patient/Family education, Balance training, and DME instructions  PLAN FOR NEXT SESSION: Check STGs; Review HEP and progress.; floor to stand transfers (supine>all fours) as able.  Follow up on OT and speech therapy referrals  Greig Anon, PT 07/06/2024 8:49 AM Phone: [phone] Fax: [phone]   [facility] at [facility]  Neuro [address] [place], [place] [address] Phone # [phone] Fax # [phone]    "

## 2024-07-08 ENCOUNTER — Emergency Department (HOSPITAL_BASED_OUTPATIENT_CLINIC_OR_DEPARTMENT_OTHER)

## 2024-07-08 ENCOUNTER — Other Ambulatory Visit: Payer: Self-pay

## 2024-07-08 ENCOUNTER — Ambulatory Visit: Admitting: Physical Therapy

## 2024-07-08 ENCOUNTER — Emergency Department (HOSPITAL_BASED_OUTPATIENT_CLINIC_OR_DEPARTMENT_OTHER)
Admission: EM | Admit: 2024-07-08 | Discharge: 2024-07-08 | Disposition: A | Discharge status: Home / Self Care | Source: Ambulatory Visit | Attending: Emergency Medicine | Admitting: Emergency Medicine

## 2024-07-08 ENCOUNTER — Encounter: Payer: Self-pay | Admitting: Physical Therapy

## 2024-07-08 DIAGNOSIS — R2689 Other abnormalities of gait and mobility: Secondary | ICD-10-CM

## 2024-07-08 DIAGNOSIS — R4182 Altered mental status, unspecified: Secondary | ICD-10-CM | POA: Insufficient documentation

## 2024-07-08 DIAGNOSIS — Z79899 Other long term (current) drug therapy: Secondary | ICD-10-CM | POA: Insufficient documentation

## 2024-07-08 DIAGNOSIS — M6281 Muscle weakness (generalized): Secondary | ICD-10-CM

## 2024-07-08 DIAGNOSIS — R2681 Unsteadiness on feet: Secondary | ICD-10-CM

## 2024-07-08 DIAGNOSIS — Z8673 Personal history of transient ischemic attack (TIA), and cerebral infarction without residual deficits: Secondary | ICD-10-CM | POA: Insufficient documentation

## 2024-07-08 LAB — TROPONIN T, HIGH SENSITIVITY: Troponin T High Sensitivity: 30 ng/L — ABNORMAL HIGH (ref 0–19)

## 2024-07-08 LAB — DIFFERENTIAL
Abs Immature Granulocytes: 0.04 K/uL (ref 0.00–0.07)
Basophils Absolute: 0 K/uL (ref 0.0–0.1)
Basophils Relative: 0 %
Eosinophils Absolute: 0.2 K/uL (ref 0.0–0.5)
Eosinophils Relative: 2 %
Immature Granulocytes: 0 %
Lymphocytes Relative: 17 %
Lymphs Abs: 1.7 K/uL (ref 0.7–4.0)
Monocytes Absolute: 0.5 K/uL (ref 0.1–1.0)
Monocytes Relative: 5 %
Neutro Abs: 7.6 K/uL (ref 1.7–7.7)
Neutrophils Relative %: 76 %

## 2024-07-08 LAB — COMPREHENSIVE METABOLIC PANEL WITH GFR
ALT: 10 U/L (ref 0–44)
AST: 12 U/L — ABNORMAL LOW (ref 15–41)
Albumin: 4.5 g/dL (ref 3.5–5.0)
Alkaline Phosphatase: 76 U/L (ref 38–126)
Anion gap: 13 (ref 5–15)
BUN: 21 mg/dL (ref 8–23)
CO2: 29 mmol/L (ref 22–32)
Calcium: 10.2 mg/dL (ref 8.9–10.3)
Chloride: 99 mmol/L (ref 98–111)
Creatinine, Ser: 1.61 mg/dL — ABNORMAL HIGH (ref 0.61–1.24)
GFR, Estimated: 44 mL/min — ABNORMAL LOW
Glucose, Bld: 125 mg/dL — ABNORMAL HIGH (ref 70–99)
Potassium: 3.9 mmol/L (ref 3.5–5.1)
Sodium: 141 mmol/L (ref 135–145)
Total Bilirubin: 0.5 mg/dL (ref 0.0–1.2)
Total Protein: 7.7 g/dL (ref 6.5–8.1)

## 2024-07-08 LAB — CBC
HCT: 47.4 % (ref 39.0–52.0)
Hemoglobin: 15.5 g/dL (ref 13.0–17.0)
MCH: 28.9 pg (ref 26.0–34.0)
MCHC: 32.7 g/dL (ref 30.0–36.0)
MCV: 88.4 fL (ref 80.0–100.0)
Platelets: 230 K/uL (ref 150–400)
RBC: 5.36 MIL/uL (ref 4.22–5.81)
RDW: 14.5 % (ref 11.5–15.5)
WBC: 10.1 K/uL (ref 4.0–10.5)
nRBC: 0 % (ref 0.0–0.2)

## 2024-07-08 LAB — APTT: aPTT: 28 s (ref 24–36)

## 2024-07-08 LAB — PROTIME-INR
INR: 0.9 (ref 0.8–1.2)
Prothrombin Time: 12.8 s (ref 11.4–15.2)

## 2024-07-08 LAB — CBG MONITORING, ED: Glucose-Capillary: 135 mg/dL — ABNORMAL HIGH (ref 70–99)

## 2024-07-08 MED ORDER — SODIUM CHLORIDE 0.9% FLUSH: 3.0000 mL | Freq: Once | INTRAVENOUS | Status: DC

## 2024-07-08 NOTE — ED Provider Notes (Signed)
 " [place] EMERGENCY DEPARTMENT AT [facility] Provider Note   CSN: [id number] Arrival date & time: 07/08/24  9065     Patient presents with: Altered Mental Status   Nicholas Mckenzie is a 79 y.o. male.   HPI Patient had a prior left-sided CVA.  He goes to physical therapy and rehab.  Patient's wife reports he has made a lot of improvement.  She does note however over the past months he has been experiencing episodes of seemingly being out of it for a brief period of time.  She reports his lucidity will be really quite good for a while and then sometimes he will just suddenly seem like he is confused or poorly responsive.  She is noting that these episodes are occurring a bit more frequently now.  Patient's wife also notes that he has started to have falls.  She reports the other day in the bathroom he was standing at the sink and just kind of fell backwards and hit his mid back on the windowsill.  He does have a bruised area but he denies there is any pain and has not had any shortness of breath.  They were at rehab today and during exercises he just kind of started staring off and listing to 1 side for a bit.  He then returned to normal.  There was no focal change in terms of the existing level of motor strength.  They recommended at rehab that he come to the emergency department for further evaluation.  Patient's wife reports that overall otherwise he does seem to be doing well.  He has not had fevers chills, productive cough, vomiting or diarrhea.  He has been eating pretty well no other focal symptoms seem to be developing.  Patient has no known seizure history and is not empirically treated for seizure prophylaxis.    Prior to Admission medications  Medication Sig Start Date End Date Taking? Authorizing Provider  amLODipine  (NORVASC ) 10 MG tablet Take 10 mg by mouth every evening.    [provider]  GLUTATHIONE PO Take 1 Dose by mouth daily. Both OTC Glutathione drink and oral  spray.    [provider]  lidocaine  (LIDODERM ) 5 % Place 1 patch onto the skin daily. Remove & Discard patch within 12 hours or as directed by MD 03/16/24   Trine Raynell Moder, MD  OVER THE COUNTER MEDICATION Take 1 capsule by mouth daily. OTC Emu Oil capsules    [provider]  OVER THE COUNTER MEDICATION Take 1 Dose by mouth daily. Total-B tincture    [provider]  OVER THE COUNTER MEDICATION Take 1 Capful by mouth daily. Kidney Complete liquid    [provider]  OVER THE COUNTER MEDICATION Take 1 Bottle by mouth daily. 120 Life, liquid supplement    [provider]  OVER THE COUNTER MEDICATION Take 1 tablet by mouth daily. OTC bone/muscle supplement    [provider]  oxybutynin  (DITROPAN ) 5 MG tablet Take 1 tablet (5 mg total) by mouth every 8 (eight) hours as needed for bladder spasms. 01/03/24   Jillian Buttery, MD  saccharomyces boulardii (FLORASTOR) 250 MG capsule Take 1 capsule (250 mg total) by mouth 2 (two) times daily. 01/03/24   Jillian Buttery, MD    Allergies: Statins, Other, and Tape    Review of Systems  Updated Vital Signs BP (!) 174/98   Pulse (!) 58   Temp 98 F (36.7 C) (Oral)   Resp 13  SpO2 93%   Physical Exam Constitutional:      Comments: Patient is alert.  He is interactive.  He does appear to have some cognitive delay and defers to his wife for more detailed explanations.  No respiratory distress.  Nontoxic.  HENT:     Head: Normocephalic and atraumatic.     Mouth/Throat:     Pharynx: Oropharynx is clear.  Cardiovascular:     Rate and Rhythm: Normal rate and regular rhythm.  Pulmonary:     Effort: Pulmonary effort is normal.     Breath sounds: Normal breath sounds.  Abdominal:     General: There is no distension.     Palpations: Abdomen is soft.     Tenderness: There is no abdominal tenderness. There is no guarding.  Musculoskeletal:     Cervical back: Neck supple.     Comments: Patient  has about a 5 cm flat contusion to the left back around the thoracic T10.  The area is nontender.  No crepitus.  No other contusions abrasions to the back.  No deformities to the extremities.  No peripheral edema calves are soft and pliable.  Skin:    General: Skin is warm and dry.  Neurological:     Comments: Patient is alert and cheerful.  He is verbally interactive but is cognitively delayed and defers a lot of information to his wife for history and details.  He is aware that he has difficulty with recall in detail.  Patient follows commands appropriately.  He performs grip strength bilateral upper extremities.  He will elevate each lower extremity off of the bed independently.  He and his wife agree that his functional level for motor strength is at baseline.  Psychiatric:        Mood and Affect: Mood normal.     (all labs ordered are listed, but only abnormal results are displayed) Labs Reviewed  COMPREHENSIVE METABOLIC PANEL WITH GFR - Abnormal; Notable for the following components:      Result Value   Glucose, Bld 125 (*)    Creatinine, Ser 1.61 (*)    AST 12 (*)    GFR, Estimated 44 (*)    All other components within normal limits  CBG MONITORING, ED - Abnormal; Notable for the following components:   Glucose-Capillary 135 (*)    All other components within normal limits  TROPONIN T, HIGH SENSITIVITY - Abnormal; Notable for the following components:   Troponin T High Sensitivity 30 (*)    All other components within normal limits  PROTIME-INR  APTT  CBC  DIFFERENTIAL  ETHANOL  URINALYSIS, ROUTINE W REFLEX MICROSCOPIC  URINE DRUG SCREEN    EKG: EKG Interpretation Date/Time:  Wednesday July 08 2024 11:08:55 EST Ventricular Rate:  69 PR Interval:  201 QRS Duration:  84 QT Interval:  396 QTC Calculation: 425 R Axis:   1  Text Interpretation: Sinus rhythm normal, no sig chnage from previous Confirmed by Armenta Canning [phone]) on 07/08/2024 2:50:20  PM  Radiology: CT HEAD WO CONTRAST Result Date: 07/08/2024 EXAM: CT HEAD WITHOUT CONTRAST 07/08/2024 10:30:40 AM TECHNIQUE: CT of the head was performed without the administration of intravenous contrast. Automated exposure control, iterative reconstruction, and/or weight based adjustment of the mA/kV was utilized to reduce the radiation dose to as low as reasonably achievable. COMPARISON: 01/16/2024 CLINICAL HISTORY: The patient presents with a mental status change of unknown cause. FINDINGS: BRAIN AND VENTRICLES: No acute hemorrhage. Age-related atrophy and periventricular small vessel ischemic change. Chronic  infarcts in right cerebellar hemisphere, right occipital lobe, left cerebellar hemisphere, right basal ganglia, and right thalamus. No evidence of acute infarct. No hydrocephalus. No extra-axial collection. No mass effect or midline shift. Moderate vascular calcifications. ORBITS: Bilateral lens replacement. No acute abnormality. SINUSES: Mild mucosal disease in left frontal and maxillary sinuses. No acute abnormality. SOFT TISSUES AND SKULL: No acute soft tissue abnormality. No skull fracture. IMPRESSION: 1. Chronic infarcts in right cerebellar hemisphere, right occipital lobe, left cerebellar hemisphere, right basal ganglia, and right thalamus. 2. Age-related atrophy and moderately advanced periventricular small vessel ischemic change. 3. Mild mucosal disease in left frontal and maxillary sinuses. Electronically signed by: Evalene Coho MD 07/08/2024 11:15 AM EST RP Workstation: [id number]     Procedures   Medications Ordered in the ED  sodium chloride  flush (NS) 0.9 % injection 3 mL (has no administration in time range)                                    Medical Decision Making Amount and/or Complexity of Data Reviewed Labs: ordered. Radiology: ordered.   Patient presents to the.  He has been having some waxing and waning vague episodes of cognitive change.  Patient's wife has  been noticing this for a while.  Patient has had significant prior CVA.  He is currently at baseline.  Differential diagnosis includes seizure episodes or TIA.  Possibly metabolic derangement.  Will proceed with diagnostic evaluation.  Troponin 30.  Comprehensive metabolic panel GFR 44 glucose 874 sodium and potassium normal CBC normal with normal differential.  CT head interpreted by radiology old infarct areas with no acute findings.  We were unable to obtain urine specimen.  Patient is not having specific UTI symptoms or fever.  At this time we will forego urinalysis with recommendations for immediate return for any discomfort or fever.  I discussed with the patient and his wife the possibility of focal seizures in the setting of prior CVA.  These episodes have been coming and going for weeks to months.  Currently there has been no persistent or focal neurologic deficit new from baseline.  Patient has been making progress in terms of his neurologic function from prior stroke.  Patient's wife reports she will call the neurologist for appointment ASAP.  She reports they have not been seen for about 6 months.  At this time stable for discharge.  I have advised for turn precautions and we discussed the follow-up plan.     Final diagnoses:  Altered mental status, unspecified altered mental status type  History of CVA (cerebrovascular accident)    ED Discharge Orders     None          Armenta Canning, MD 07/08/24 1536  "

## 2024-07-08 NOTE — ED Triage Notes (Addendum)
 Pt arrives with wife from PT where pt reportedly became more confused and had some R sided weakness. LKW 0200 per pt's wife.

## 2024-07-08 NOTE — ED Notes (Signed)
 PA went into the room for Stroke Eval.

## 2024-07-08 NOTE — ED Notes (Signed)
 DC paperwork given and verbally understood.

## 2024-07-08 NOTE — Therapy (Signed)
 " OUTPATIENT PHYSICAL THERAPY NEURO TREATMENT NOTE   Patient Name: Nicholas Mckenzie MRN: [id number] DOB:Jan 16, 1946, 79 y.o., male Today's Date: 07/08/2024   PCP: Nicholas Velna SAUNDERS, MD  REFERRING PROVIDER: Vernon Velna SAUNDERS, MD      END OF SESSION:  PT End of Session - 07/08/24 0853     Visit Number 9    Number of Visits 17    Date for Recertification  08/07/24    Authorization Type [employer] Medicare-approved 17 PT visits from 06/10/24-08/19/24.    Authorization - Visit Number 9    Authorization - Number of Visits 17    Progress Note Due on Visit 19    PT Start Time 0854   arrives late   PT Stop Time 0924    PT Time Calculation (min) 30 min    Equipment Utilized During Treatment Gait belt    Activity Tolerance Patient limited by lethargy;Patient limited by fatigue;Other (comment)   requires extra time for movement and instruction today (more so than usual)   Behavior During Therapy [facility] for tasks assessed/performed;Flat affect                Past Medical History:  Diagnosis Date   Acute respiratory failure with hypoxia (HCC) 08/16/2021   Aspiration pneumonia (HCC) 08/15/2021   CKD (chronic kidney disease) stage 3, GFR 30-59 ml/min (HCC)    COVID-19 virus infection 06/06/2021   COVID-19 Labs     No results for input(s): DDIMER, FERRITIN, LDH, CRP in the last 72 hours.             Lab Results      Component    Value    Date           SARSCOV2NAA    POSITIVE (A)    06/06/2021           SARSCOV2NAA    NEGATIVE    04/12/2021           SARSCOV2NAA    NEGATIVE    04/01/2021           SARSCOV2NAA    NEGATIVE    03/11/2021           Hypertension    Kidney stones    Severe sepsis (HCC) 08/16/2021   Sleep apnea    Subdural hematoma (HCC) 06/06/2021   Past Surgical History:  Procedure Laterality Date   BLADDER STONE REMOVAL  12/2021   CATARACT EXTRACTION     IR NEPHROSTOMY EXCHANGE RIGHT  06/07/2021   IR NEPHROSTOMY EXCHANGE RIGHT  08/22/2021   IR NEPHROSTOMY PLACEMENT RIGHT  04/07/2021    IR PATIENT EVAL TECH 0-60 MINS  06/02/2021   kidney stent     MANDIBLE FRACTURE SURGERY     NEPHRECTOMY Right 01/07/2022   Patient Active Problem List   Diagnosis Date Noted   Rectal bleeding 01/02/2024   GI bleed 01/01/2024   Acute kidney injury superimposed on stage 3b chronic kidney disease (HCC) 01/01/2024   Transfusion of blood product refused for religious reason 09/03/2022   Hx of subdural hematoma 08/16/2021   Nephrolithiasis 08/16/2021   Obesity (BMI 30-39.9) 08/16/2021   OSA (obstructive sleep apnea) 08/16/2021   Frequent urination 06/07/2021   UTI (urinary tract infection) 06/07/2021   Ascending aortic aneurysm 06/07/2021   Atrophy of right kidney 06/07/2021   Anemia 06/07/2021   H/O ischemic right PCA stroke 06/06/2021   History of cardioembolic cerebrovascular accident (CVA) 06/06/2021   Right-sided cerebrovascular accident (CVA) (HCC) 04/14/2021  Nonfunctioning kidney 04/14/2021   Goals of care, counseling/discussion 04/03/2021   Ureteral stent occlusion 04/02/2021   Stage 3b chronic kidney disease (HCC) 03/11/2021   Major depressive disorder, recurrent episode, in partial remission 03/11/2021   GAD (generalized anxiety disorder) 07/20/2019   Essential (primary) hypertension 05/25/2019    ONSET DATE: 05/28/2024 MD referral  REFERRING DIAG: P30.645 (ICD-10-CM) - Hemiplegia and hemiparesis following cerebral infarction affecting left non-dominant side   THERAPY DIAG:  Muscle weakness (generalized)  Unsteadiness on feet  Other abnormalities of gait and mobility  Rationale for Evaluation and Treatment: Rehabilitation  SUBJECTIVE:                                                                                                                                                                                             SUBJECTIVE STATEMENT: Pt reports he's tired today and wife reports he had a major fall yesterday.   Pt accompanied by: significant other,  spouse, Nicholas Mckenzie  PERTINENT HISTORY: CVA 2022, hx of subdural hematoma 2023, chronic kidney disease, HTN  PAIN:  Are you having pain? 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 Yes. Number of falls 4  LIVING ENVIRONMENT: Lives with: lives with their spouse Lives in: House/apartment Stairs: ramp Has following equipment at home: Single point cane, Environmental Consultant - 2 wheeled, and Wheelchair (manual)  PLOF: Independent  PATIENT GOALS: Would like to walk with the walker (not behind the wheelchair anymore)  OBJECTIVE:    TODAY'S TREATMENT: 07/08/2024 Activity Comments  FTSTS 56.97 sec with BUE support Improved from 71 sec  TUG attempted:  >2 min 30 sec Had to stop timing as pt has difficulty manuevering 4WW to turn and sit, needing PT assistance  Assessed BP measures as pt reports don't feel right 141/100  Max assist for chair to w/c transfer   Mod/max assist for car transfer            PATIENT EDUCATION: Education details:  NA today 07/08/2024 Person educated: Spouse Education method: Explanation, Demonstration, and Verbal cues Education comprehension: verbalized understanding    Access Code: [email or web address] URL: [email or web address] Date: 06/15/2024 Prepared by: [facility] - Outpatient  Rehab - [facility]  Exercises - Seated Heel Toe Raises  - 2 x daily - 7 x weekly - 2 sets - 10 reps - Seated Hip Flexion March with Ankle Weights  - 1 x daily - 5 x weekly - 3 sets - 10 reps - Seated Long [address]  with Ankle Weight  - 1 x daily - 5 x weekly - 3 sets - 10 reps - Sit to Stand with Armchair  - 1 x daily - 7 x weekly - 3 sets - 5 reps    -------------------------- Note: Objective measures were completed at Evaluation unless otherwise noted.  DIAGNOSTIC FINDINGS: CT head 12/2023:  IMPRESSION: 1. No acute intracranial  abnormality, specifically, no acute hemorrhage, territorial infarction, or intracranial mass. 2. Global cerebral volume loss with sequelae of advanced chronic ischemic microvascular disease.  COGNITION: Overall cognitive status: History of cognitive impairments - at baseline and wife reports concern regarding cognition   SENSATION: Light touch: Impaired  Impaired to light touch LLE  MUSCLE TONE: LLE: Within functional limits    POSTURE: rounded shoulders, forward head, posterior pelvic tilt, and flexed trunk   LOWER EXTREMITY ROM:   WFL BLEs  LOWER EXTREMITY MMT:    MMT Right Eval Left Eval  Hip flexion 3+ 3+  Hip extension    Hip abduction 4 4  Hip adduction 4 4  Hip internal rotation    Hip external rotation    Knee flexion 3+ 3+  Knee extension 3+ 3+  Ankle dorsiflexion 3+ 3  Ankle plantarflexion    Ankle inversion    Ankle eversion    (Blank rows = not tested)    TRANSFERS: Sit to stand: CGA and tends to use hands at walker  Assistive device utilized: Environmental Consultant - 2 wheeled     Stand to sit: CGA  Assistive device utilized: Environmental Consultant - 2 wheeled      GAIT: Findings: Gait Characteristics: pt steps through, but stops to move walker, step through pattern, decreased step length- Right, decreased step length- Left, trunk flexed, poor foot clearance- Right, and poor foot clearance- Left, Distance walked: 50 ft, Assistive device utilized:Walker - 2 wheeled, Level of assistance: CGA and Min A, and Comments: tends to look around to other people in gym and stops walking, needs redirection to task several times with testing  FUNCTIONAL TESTS:  5 times sit to stand: 71.84 sec with definite UE support Timed up and go (TUG): 129.53 sec 10 meter walk test: 149.56 sec = 0.22 ft/sec  Needs redirection to task multiple times during eval tasks                                                                                                                              TREATMENT DATE:  06/10/2024    PATIENT EDUCATION: Education details: Eval results, POC, initiated HEP Person educated: Patient and Spouse Education method: Explanation, Demonstration, Verbal cues, and Handouts Education comprehension: verbalized understanding, returned demonstration, and needs further education  HOME EXERCISE PROGRAM: Access Code: [email or web address] URL: [email or web address] Date: 06/10/2024 Prepared by: [facility] - Outpatient  Rehab - [facility]  Exercises - Seated March  - 2 x daily - 7 x weekly - 2 sets - 10 reps - Seated Long Arc Quad  -  2 x daily - 7 x weekly - 2 sets - 10 reps - Seated Heel Toe Raises  - 2 x daily - 7 x weekly - 2 sets - 10 reps  GOALS: Goals reviewed with patient? Yes  SHORT TERM GOALS: Target date: 07/10/2024  Pt will be supervision with HEP for improved strength, balance, transfers, gait. Baseline: Goal status: INITIAL  2.  Pt will improve 5x sit<>stand to less than or equal to 45 sec to demonstrate improved functional strength and transfer efficiency. Baseline: 71.84 sec Goal status: INITIAL  3.  Pt will improve TUG score to less than or equal to 60 sec for decreased fall risk. Baseline: 129.53 sec Goal status: INITIAL  LONG TERM GOALS: Target date: 08/07/2024  Pt will be supervision with HEP for improved strength, balance, transfers, gait. Baseline:  Goal status: INITIAL  2.  Pt will improve 5x sit<>stand to less than or equal to 20 sec to demonstrate improved functional strength and transfer efficiency. Baseline:  Goal status: INITIAL  3.  Pt will improve TUG score to less than or equal to 30 sec for decreased fall risk. Baseline:  Goal status: INITIAL  4.  Pt will improve gait velocity to at least 1.8 ft/sec for improved gait efficiency and safety. Baseline: 0.22 ft/sec Goal status: INITIAL  5.  Pt will ambulate at least 500 ft, indoors and outdoors surfaces with RW, supervision, for improved community gait. Baseline:   Goal status: INITIAL  6.  Berg Balance test TBA and to improved to at least 45/56 for decreased risk of falls. Baseline:  Goal status: INITIAL  ASSESSMENT:  CLINICAL IMPRESSION: Attempted to assess STGs today; however, with attempt at TUG after 5x sit to stand test, pt has increased difficulty sequencing turn to sit in chair and upon sitting is leaning heavily to R side.  Wife reports pt did have a fall yesterday and bruised his back.  Discussed options to seek care and wife reports they will proceed to [facility] ED.  OBJECTIVE IMPAIRMENTS: Abnormal gait, decreased activity tolerance, decreased balance, decreased cognition, decreased mobility, difficulty walking, decreased strength, decreased safety awareness, and postural dysfunction.   ACTIVITY LIMITATIONS: bending, sitting, standing, transfers, bathing, toileting, dressing, self feeding, reach over head, hygiene/grooming, locomotion level, and caring for others  PARTICIPATION LIMITATIONS: meal prep, cleaning, laundry, medication management, interpersonal relationship, driving, shopping, and community activity  PERSONAL FACTORS: 3+ comorbidities: see above for PMH are also affecting patient's functional outcome.   REHAB POTENTIAL: Good  CLINICAL DECISION MAKING: Evolving/moderate complexity  EVALUATION COMPLEXITY: Moderate  PLAN:  PT FREQUENCY: 2x/week  PT DURATION: 8 weeks plus eval visit  PLANNED INTERVENTIONS: 97750- Physical Performance Testing, 97110-Therapeutic exercises, 97530- Therapeutic activity, [id number]- Neuromuscular re-education, 97535- Self Care, [id number]- Manual therapy, [phone]- Gait training, Patient/Family education, Balance training, and DME instructions  PLAN FOR NEXT SESSION: 10th Visit PN.  Check STGs; Review HEP and progress.; floor to stand transfers (supine>all fours) as able.  Follow up on OT and speech therapy referrals  Greig Anon, PT 07/08/2024 9:28 AM Phone: [phone] Fax: [phone]   [facility] at [facility] [address] [place], [address] [place], [place] [address] Phone # [phone] Fax # [phone]    "

## 2024-07-08 NOTE — Discharge Instructions (Addendum)
 1.  At this time there is no obvious cause for the episodes you are experiencing.  However, people who have had prior strokes sometimes have subtle seizures.  This can be difficult to appreciate but it can be vague inattention or brief staring episodes.  People may also experience TIAs or mini strokes.  These are symptoms that come but then resolve quickly.  CT scan shows areas of old stroke but no new stroke.  However if you identify any new neurologic symptoms different then you had previously had, return to the emergency department immediately for recheck. 2.  Return to the emergency department if you develop a fever, vomiting, any concerning areas of pain or new changes.  Continue all of your regularly prescribed medications.  Schedule follow-up with your doctor for recheck as soon as possible.  Call your neurologist office as soon as possible to schedule a recheck.

## 2024-07-08 NOTE — ED Notes (Signed)
 Attempt to I/O Cath for urine sample.  Unsuccessful.  Attempt x 2 with straight cath and coude cath.

## 2024-07-13 ENCOUNTER — Ambulatory Visit

## 2024-07-13 ENCOUNTER — Telehealth: Payer: Self-pay | Admitting: Physical Therapy

## 2024-07-13 NOTE — Telephone Encounter (Signed)
 Called patient and left VM according to DPR in chart. Notified pt of appointment cancellation due to provider out.  Louana Terrilyn Christians, PT, DPT 07/13/24 5:50 AM  [facility] [address] [place], [address] [place], [place] [address] Phone # [phone] Fax # [phone]

## 2024-07-15 ENCOUNTER — Ambulatory Visit

## 2024-07-17 NOTE — Therapy (Incomplete)
 " OUTPATIENT PHYSICAL THERAPY NEURO PROGRESS NOTE   Patient Name: Nicholas Mckenzie MRN: [id number] DOB:31-Jul-1945, 79 y.o., male Today's Date: 07/17/2024   PCP: Vernon Velna SAUNDERS, MD  REFERRING PROVIDER: Vernon Velna SAUNDERS, MD   Progress Note Reporting Period 06/10/24 to 07/20/24  See note below for Objective Data and Assessment of Progress/Goals.    END OF SESSION:          Past Medical History:  Diagnosis Date   Acute respiratory failure with hypoxia (HCC) 08/16/2021   Aspiration pneumonia (HCC) 08/15/2021   CKD (chronic kidney disease) stage 3, GFR 30-59 ml/min (HCC)    COVID-19 virus infection 06/06/2021   COVID-19 Labs     No results for input(s): DDIMER, FERRITIN, LDH, CRP in the last 72 hours.             Lab Results      Component    Value    Date           SARSCOV2NAA    POSITIVE (A)    06/06/2021           SARSCOV2NAA    NEGATIVE    04/12/2021           SARSCOV2NAA    NEGATIVE    04/01/2021           SARSCOV2NAA    NEGATIVE    03/11/2021           Hypertension    Kidney stones    Severe sepsis (HCC) 08/16/2021   Sleep apnea    Subdural hematoma (HCC) 06/06/2021   Past Surgical History:  Procedure Laterality Date   BLADDER STONE REMOVAL  12/2021   CATARACT EXTRACTION     IR NEPHROSTOMY EXCHANGE RIGHT  06/07/2021   IR NEPHROSTOMY EXCHANGE RIGHT  08/22/2021   IR NEPHROSTOMY PLACEMENT RIGHT  04/07/2021   IR PATIENT EVAL TECH 0-60 MINS  06/02/2021   kidney stent     MANDIBLE FRACTURE SURGERY     NEPHRECTOMY Right 01/07/2022   Patient Active Problem List   Diagnosis Date Noted   Rectal bleeding 01/02/2024   GI bleed 01/01/2024   Acute kidney injury superimposed on stage 3b chronic kidney disease (HCC) 01/01/2024   Transfusion of blood product refused for religious reason 09/03/2022   Hx of subdural hematoma 08/16/2021   Nephrolithiasis 08/16/2021   Obesity (BMI 30-39.9) 08/16/2021   OSA (obstructive sleep apnea) 08/16/2021   Frequent urination 06/07/2021   UTI  (urinary tract infection) 06/07/2021   Ascending aortic aneurysm 06/07/2021   Atrophy of right kidney 06/07/2021   Anemia 06/07/2021   H/O ischemic right PCA stroke 06/06/2021   History of cardioembolic cerebrovascular accident (CVA) 06/06/2021   Right-sided cerebrovascular accident (CVA) (HCC) 04/14/2021   Nonfunctioning kidney 04/14/2021   Goals of care, counseling/discussion 04/03/2021   Ureteral stent occlusion 04/02/2021   Stage 3b chronic kidney disease (HCC) 03/11/2021   Major depressive disorder, recurrent episode, in partial remission 03/11/2021   GAD (generalized anxiety disorder) 07/20/2019   Essential (primary) hypertension 05/25/2019    ONSET DATE: 05/28/2024 MD referral  REFERRING DIAG: P30.645 (ICD-10-CM) - Hemiplegia and hemiparesis following cerebral infarction affecting left non-dominant side   THERAPY DIAG:  No diagnosis found.  Rationale for Evaluation and Treatment: Rehabilitation  SUBJECTIVE:  SUBJECTIVE STATEMENT: Pt reports he's tired today and wife reports he had a major fall yesterday.   Pt accompanied by: significant other, spouse, [name]  PERTINENT HISTORY: CVA 2022, hx of subdural hematoma 2023, chronic kidney disease, HTN  PAIN:  Are you having pain? 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 Yes. Number of falls 4  LIVING ENVIRONMENT: Lives with: lives with their spouse Lives in: House/apartment Stairs: ramp Has following equipment at home: Single point cane, Environmental Consultant - 2 wheeled, and Wheelchair (manual)  PLOF: Independent  PATIENT GOALS: Would like to walk with the walker (not behind the wheelchair  anymore)  OBJECTIVE:      TODAY'S TREATMENT: 07/20/24 Activity Comments                        TODAY'S TREATMENT: 07/08/2024 Activity Comments  FTSTS 56.97 sec with BUE support Improved from 71 sec  TUG attempted:  >2 min 30 sec Had to stop timing as pt has difficulty manuevering 4WW to turn and sit, needing PT assistance  Assessed BP measures as pt reports don't feel right 141/100  Max assist for chair to w/c transfer   Mod/max assist for car transfer            PATIENT EDUCATION: Education details:  NA today 07/08/2024 Person educated: Spouse Education method: Explanation, Demonstration, and Verbal cues Education comprehension: verbalized understanding    Access Code: [email or web address] URL: [email or web address] Date: 06/15/2024 Prepared by: [facility] - Outpatient  Rehab - [facility]  Exercises - Seated Heel Toe Raises  - 2 x daily - 7 x weekly - 2 sets - 10 reps - Seated Hip Flexion March with Ankle Weights  - 1 x daily - 5 x weekly - 3 sets - 10 reps - Seated Long Arc Quad with Ankle Weight  - 1 x daily - 5 x weekly - 3 sets - 10 reps - Sit to Stand with Armchair  - 1 x daily - 7 x weekly - 3 sets - 5 reps    -------------------------- Note: Objective measures were completed at Evaluation unless otherwise noted.  DIAGNOSTIC FINDINGS: CT head 12/2023:  IMPRESSION: 1. No acute intracranial abnormality, specifically, no acute hemorrhage, territorial infarction, or intracranial mass. 2. Global cerebral volume loss with sequelae of advanced chronic ischemic microvascular disease.  COGNITION: Overall cognitive status: History of cognitive impairments - at baseline and wife reports concern regarding cognition   SENSATION: Light touch: Impaired  Impaired to light touch LLE  MUSCLE TONE: LLE: Within functional limits    POSTURE: rounded shoulders, forward head, posterior pelvic tilt, and flexed trunk   LOWER EXTREMITY ROM:   WFL  BLEs  LOWER EXTREMITY MMT:    MMT Right Eval Left Eval  Hip flexion 3+ 3+  Hip extension    Hip abduction 4 4  Hip adduction 4 4  Hip internal rotation    Hip external rotation    Knee flexion 3+ 3+  Knee extension 3+ 3+  Ankle dorsiflexion 3+ 3  Ankle plantarflexion    Ankle inversion    Ankle eversion    (Blank rows = not tested)    TRANSFERS: Sit to stand: CGA and tends to use hands at walker  Assistive device utilized: Environmental Consultant - 2 wheeled     Stand to sit:  CGA  Assistive device utilized: Environmental Consultant - 2 wheeled      GAIT: Findings: Gait Characteristics: pt steps through, but stops to move walker, step through pattern, decreased step length- Right, decreased step length- Left, trunk flexed, poor foot clearance- Right, and poor foot clearance- Left, Distance walked: 50 ft, Assistive device utilized:Walker - 2 wheeled, Level of assistance: CGA and Min A, and Comments: tends to look around to other people in gym and stops walking, needs redirection to task several times with testing  FUNCTIONAL TESTS:  5 times sit to stand: 71.84 sec with definite UE support Timed up and go (TUG): 129.53 sec 10 meter walk test: 149.56 sec = 0.22 ft/sec  Needs redirection to task multiple times during eval tasks                                                                                                                              TREATMENT DATE: 06/10/2024    PATIENT EDUCATION: Education details: Eval results, POC, initiated HEP Person educated: Patient and Spouse Education method: Explanation, Demonstration, Verbal cues, and Handouts Education comprehension: verbalized understanding, returned demonstration, and needs further education  HOME EXERCISE PROGRAM: Access Code: [email or web address] URL: [email or web address] Date: 06/10/2024 Prepared by: [facility] - Outpatient  Rehab - [facility]  Exercises - Seated March  - 2 x daily - 7 x weekly - 2 sets - 10 reps - Seated Long  Arc Quad  - 2 x daily - 7 x weekly - 2 sets - 10 reps - Seated Heel Toe Raises  - 2 x daily - 7 x weekly - 2 sets - 10 reps  GOALS: Goals reviewed with patient? Yes  SHORT TERM GOALS: Target date: 07/10/2024  Pt will be supervision with HEP for improved strength, balance, transfers, gait. Baseline: Goal status: INITIAL  2.  Pt will improve 5x sit<>stand to less than or equal to 45 sec to demonstrate improved functional strength and transfer efficiency. Baseline: 71.84 sec Goal status: INITIAL  3.  Pt will improve TUG score to less than or equal to 60 sec for decreased fall risk. Baseline: 129.53 sec Goal status: INITIAL  LONG TERM GOALS: Target date: 08/07/2024  Pt will be supervision with HEP for improved strength, balance, transfers, gait. Baseline:  Goal status: INITIAL  2.  Pt will improve 5x sit<>stand to less than or equal to 20 sec to demonstrate improved functional strength and transfer efficiency. Baseline:  Goal status: INITIAL  3.  Pt will improve TUG score to less than or equal to 30 sec for decreased fall risk. Baseline:  Goal status: INITIAL  4.  Pt will improve gait velocity to at least 1.8 ft/sec for improved gait efficiency and safety. Baseline: 0.22 ft/sec Goal status: INITIAL  5.  Pt will ambulate at least 500 ft, indoors and outdoors surfaces with RW, supervision, for improved community gait. Baseline:  Goal status: INITIAL  6.  Berg Balance test TBA and to  improved to at least 45/56 for decreased risk of falls. Baseline: 29/56 06/19/24 Goal status: INITIAL  ASSESSMENT:  CLINICAL IMPRESSION: Attempted to assess STGs today; however, with attempt at TUG after 5x sit to stand test, pt has increased difficulty sequencing turn to sit in chair and upon sitting is leaning heavily to R side.  Wife reports pt did have a fall yesterday and bruised his back.  Discussed options to seek care and wife reports they will proceed to [facility] ED.  OBJECTIVE  IMPAIRMENTS: Abnormal gait, decreased activity tolerance, decreased balance, decreased cognition, decreased mobility, difficulty walking, decreased strength, decreased safety awareness, and postural dysfunction.   ACTIVITY LIMITATIONS: bending, sitting, standing, transfers, bathing, toileting, dressing, self feeding, reach over head, hygiene/grooming, locomotion level, and caring for others  PARTICIPATION LIMITATIONS: meal prep, cleaning, laundry, medication management, interpersonal relationship, driving, shopping, and community activity  PERSONAL FACTORS: 3+ comorbidities: see above for PMH are also affecting patient's functional outcome.   REHAB POTENTIAL: Good  CLINICAL DECISION MAKING: Evolving/moderate complexity  EVALUATION COMPLEXITY: Moderate  PLAN:  PT FREQUENCY: 2x/week  PT DURATION: 8 weeks plus eval visit  PLANNED INTERVENTIONS: 97750- Physical Performance Testing, 97110-Therapeutic exercises, 97530- Therapeutic activity, [id number]- Neuromuscular re-education, 97535- Self Care, 02859- Manual therapy, [id number]- Gait training, Patient/Family education, Balance training, and DME instructions  PLAN FOR NEXT SESSION: 10th Visit PN.  Check STGs; Review HEP and progress.; floor to stand transfers (supine>all fours) as able.  Follow up on OT and speech therapy referrals   "

## 2024-07-20 ENCOUNTER — Ambulatory Visit: Admitting: Physical Therapy

## 2024-07-20 ENCOUNTER — Ambulatory Visit

## 2024-07-22 ENCOUNTER — Ambulatory Visit: Admitting: Physical Therapy

## 2024-07-27 ENCOUNTER — Ambulatory Visit: Admitting: Physical Therapy

## 2024-07-29 ENCOUNTER — Ambulatory Visit: Admitting: Physical Therapy

## 2024-07-29 ENCOUNTER — Telehealth: Payer: Self-pay | Admitting: Physical Therapy

## 2024-07-29 NOTE — Therapy (Incomplete)
 " OUTPATIENT PHYSICAL THERAPY NEURO PROGRESS NOTE   Patient Name: Nicholas Mckenzie MRN: [id number] DOB:12/05/1945, 79 y.o., male Today's Date: 07/29/2024   PCP: Vernon Velna SAUNDERS, MD  REFERRING PROVIDER: Vernon Velna SAUNDERS, MD   Progress Note Reporting Period 06/10/24 to 07/20/24  See note below for Objective Data and Assessment of Progress/Goals.    END OF SESSION:          Past Medical History:  Diagnosis Date   Acute respiratory failure with hypoxia (HCC) 08/16/2021   Aspiration pneumonia (HCC) 08/15/2021   CKD (chronic kidney disease) stage 3, GFR 30-59 ml/min (HCC)    COVID-19 virus infection 06/06/2021   COVID-19 Labs     No results for input(s): DDIMER, FERRITIN, LDH, CRP in the last 72 hours.             Lab Results      Component    Value    Date           SARSCOV2NAA    POSITIVE (A)    06/06/2021           SARSCOV2NAA    NEGATIVE    04/12/2021           SARSCOV2NAA    NEGATIVE    04/01/2021           SARSCOV2NAA    NEGATIVE    03/11/2021           Hypertension    Kidney stones    Severe sepsis (HCC) 08/16/2021   Sleep apnea    Subdural hematoma (HCC) 06/06/2021   Past Surgical History:  Procedure Laterality Date   BLADDER STONE REMOVAL  12/2021   CATARACT EXTRACTION     IR NEPHROSTOMY EXCHANGE RIGHT  06/07/2021   IR NEPHROSTOMY EXCHANGE RIGHT  08/22/2021   IR NEPHROSTOMY PLACEMENT RIGHT  04/07/2021   IR PATIENT EVAL TECH 0-60 MINS  06/02/2021   kidney stent     MANDIBLE FRACTURE SURGERY     NEPHRECTOMY Right 01/07/2022   Patient Active Problem List   Diagnosis Date Noted   Rectal bleeding 01/02/2024   GI bleed 01/01/2024   Acute kidney injury superimposed on stage 3b chronic kidney disease (HCC) 01/01/2024   Transfusion of blood product refused for religious reason 09/03/2022   Hx of subdural hematoma 08/16/2021   Nephrolithiasis 08/16/2021   Obesity (BMI 30-39.9) 08/16/2021   OSA (obstructive sleep apnea) 08/16/2021   Frequent urination 06/07/2021   UTI  (urinary tract infection) 06/07/2021   Ascending aortic aneurysm 06/07/2021   Atrophy of right kidney 06/07/2021   Anemia 06/07/2021   H/O ischemic right PCA stroke 06/06/2021   History of cardioembolic cerebrovascular accident (CVA) 06/06/2021   Right-sided cerebrovascular accident (CVA) (HCC) 04/14/2021   Nonfunctioning kidney 04/14/2021   Goals of care, counseling/discussion 04/03/2021   Ureteral stent occlusion 04/02/2021   Stage 3b chronic kidney disease (HCC) 03/11/2021   Major depressive disorder, recurrent episode, in partial remission 03/11/2021   GAD (generalized anxiety disorder) 07/20/2019   Essential (primary) hypertension 05/25/2019    ONSET DATE: 05/28/2024 MD referral  REFERRING DIAG: P30.645 (ICD-10-CM) - Hemiplegia and hemiparesis following cerebral infarction affecting left non-dominant side   THERAPY DIAG:  No diagnosis found.  Rationale for Evaluation and Treatment: Rehabilitation  SUBJECTIVE:  SUBJECTIVE STATEMENT: ***Pt reports he's tired today and wife reports he had a major fall yesterday.   Pt accompanied by: significant other, spouse, [name]  PERTINENT HISTORY: CVA 2022, hx of subdural hematoma 2023, chronic kidney disease, HTN  PAIN:  Are you having pain? 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 Yes. Number of falls 4  LIVING ENVIRONMENT: Lives with: lives with their spouse Lives in: House/apartment Stairs: ramp Has following equipment at home: Single point cane, Environmental Consultant - 2 wheeled, and Wheelchair (manual)  PLOF: Independent  PATIENT GOALS: Would like to walk with the walker (not behind the wheelchair  anymore)  OBJECTIVE:      TODAY'S TREATMENT: 07/29/24 Activity Comments  FTSTS   TUG                   TODAY'S TREATMENT: 07/08/2024 Activity Comments  FTSTS 56.97 sec with BUE support Improved from 71 sec  TUG attempted:  >2 min 30 sec Had to stop timing as pt has difficulty manuevering 4WW to turn and sit, needing PT assistance  Assessed BP measures as pt reports don't feel right 141/100  Max assist for chair to w/c transfer   Mod/max assist for car transfer            PATIENT EDUCATION: Education details:  NA today 07/08/2024 Person educated: Spouse Education method: Explanation, Demonstration, and Verbal cues Education comprehension: verbalized understanding    Access Code: [email or web address] URL: [email or web address] Date: 06/15/2024 Prepared by: [facility] - Outpatient  Rehab - [facility]  Exercises - Seated Heel Toe Raises  - 2 x daily - 7 x weekly - 2 sets - 10 reps - Seated Hip Flexion March with Ankle Weights  - 1 x daily - 5 x weekly - 3 sets - 10 reps - Seated Long Arc Quad with Ankle Weight  - 1 x daily - 5 x weekly - 3 sets - 10 reps - Sit to Stand with Armchair  - 1 x daily - 7 x weekly - 3 sets - 5 reps    -------------------------- Note: Objective measures were completed at Evaluation unless otherwise noted.  DIAGNOSTIC FINDINGS: CT head 12/2023:  IMPRESSION: 1. No acute intracranial abnormality, specifically, no acute hemorrhage, territorial infarction, or intracranial mass. 2. Global cerebral volume loss with sequelae of advanced chronic ischemic microvascular disease.  COGNITION: Overall cognitive status: History of cognitive impairments - at baseline and wife reports concern regarding cognition   SENSATION: Light touch: Impaired  Impaired to light touch LLE  MUSCLE TONE: LLE: Within functional limits    POSTURE: rounded shoulders, forward head, posterior pelvic tilt, and flexed trunk   LOWER EXTREMITY ROM:   WFL  BLEs  LOWER EXTREMITY MMT:    MMT Right Eval Left Eval  Hip flexion 3+ 3+  Hip extension    Hip abduction 4 4  Hip adduction 4 4  Hip internal rotation    Hip external rotation    Knee flexion 3+ 3+  Knee extension 3+ 3+  Ankle dorsiflexion 3+ 3  Ankle plantarflexion    Ankle inversion    Ankle eversion    (Blank rows = not tested)    TRANSFERS: Sit to stand: CGA and tends to use hands at walker  Assistive device utilized: Environmental Consultant - 2 wheeled     Stand to sit:  CGA  Assistive device utilized: Environmental Consultant - 2 wheeled      GAIT: Findings: Gait Characteristics: pt steps through, but stops to move walker, step through pattern, decreased step length- Right, decreased step length- Left, trunk flexed, poor foot clearance- Right, and poor foot clearance- Left, Distance walked: 50 ft, Assistive device utilized:Walker - 2 wheeled, Level of assistance: CGA and Min A, and Comments: tends to look around to other people in gym and stops walking, needs redirection to task several times with testing  FUNCTIONAL TESTS:  5 times sit to stand: 71.84 sec with definite UE support Timed up and go (TUG): 129.53 sec 10 meter walk test: 149.56 sec = 0.22 ft/sec  Needs redirection to task multiple times during eval tasks                                                                                                                              TREATMENT DATE: 06/10/2024    PATIENT EDUCATION: Education details: Eval results, POC, initiated HEP Person educated: Patient and Spouse Education method: Explanation, Demonstration, Verbal cues, and Handouts Education comprehension: verbalized understanding, returned demonstration, and needs further education  HOME EXERCISE PROGRAM: Access Code: [email or web address] URL: [email or web address] Date: 06/10/2024 Prepared by: [facility] - Outpatient  Rehab - [facility]  Exercises - Seated March  - 2 x daily - 7 x weekly - 2 sets - 10 reps - Seated Long  Arc Quad  - 2 x daily - 7 x weekly - 2 sets - 10 reps - Seated Heel Toe Raises  - 2 x daily - 7 x weekly - 2 sets - 10 reps  GOALS: Goals reviewed with patient? Yes  SHORT TERM GOALS: Target date: 07/10/2024  Pt will be supervision with HEP for improved strength, balance, transfers, gait. Baseline: Goal status: INITIAL  2.  Pt will improve 5x sit<>stand to less than or equal to 45 sec to demonstrate improved functional strength and transfer efficiency. Baseline: 71.84 sec Goal status: INITIAL  3.  Pt will improve TUG score to less than or equal to 60 sec for decreased fall risk. Baseline: 129.53 sec Goal status: INITIAL  LONG TERM GOALS: Target date: 08/07/2024  Pt will be supervision with HEP for improved strength, balance, transfers, gait. Baseline:  Goal status: INITIAL  2.  Pt will improve 5x sit<>stand to less than or equal to 20 sec to demonstrate improved functional strength and transfer efficiency. Baseline:  Goal status: INITIAL  3.  Pt will improve TUG score to less than or equal to 30 sec for decreased fall risk. Baseline:  Goal status: INITIAL  4.  Pt will improve gait velocity to at least 1.8 ft/sec for improved gait efficiency and safety. Baseline: 0.22 ft/sec Goal status: INITIAL  5.  Pt will ambulate at least 500 ft, indoors and outdoors surfaces with RW, supervision, for improved community gait. Baseline:  Goal status: INITIAL  6.  Berg Balance test TBA and to  improved to at least 45/56 for decreased risk of falls. Baseline: 29/56 06/19/24 Goal status: INITIAL  ASSESSMENT:  CLINICAL IMPRESSION: Pt presents today ***. Skilled PT session focused on ***. Pt needs ***. Pt will continue to benefit from skilled PT towards goals for improved functional mobility and decreased fall risk.  Attempted to assess STGs today; however, with attempt at TUG after 5x sit to stand test, pt has increased difficulty sequencing turn to sit in chair and upon sitting is  leaning heavily to R side.  Wife reports pt did have a fall yesterday and bruised his back.  Discussed options to seek care and wife reports they will proceed to [facility] ED.  OBJECTIVE IMPAIRMENTS: Abnormal gait, decreased activity tolerance, decreased balance, decreased cognition, decreased mobility, difficulty walking, decreased strength, decreased safety awareness, and postural dysfunction.   ACTIVITY LIMITATIONS: bending, sitting, standing, transfers, bathing, toileting, dressing, self feeding, reach over head, hygiene/grooming, locomotion level, and caring for others  PARTICIPATION LIMITATIONS: meal prep, cleaning, laundry, medication management, interpersonal relationship, driving, shopping, and community activity  PERSONAL FACTORS: 3+ comorbidities: see above for PMH are also affecting patient's functional outcome.   REHAB POTENTIAL: Good  CLINICAL DECISION MAKING: Evolving/moderate complexity  EVALUATION COMPLEXITY: Moderate  PLAN:  PT FREQUENCY: 2x/week  PT DURATION: 8 weeks plus eval visit  PLANNED INTERVENTIONS: 97750- Physical Performance Testing, 97110-Therapeutic exercises, 97530- Therapeutic activity, [id number]- Neuromuscular re-education, 97535- Self Care, 02859- Manual therapy, [phone]- Gait training, Patient/Family education, Balance training, and DME instructions  PLAN FOR NEXT SESSION: ***10th Visit PN.  Check STGs; Review HEP and progress.; floor to stand transfers (supine>all fours) as able.  Follow up on OT and speech therapy referrals   "

## 2024-07-29 NOTE — Telephone Encounter (Signed)
 Called and spoke with wife, [name], about missed PT appt today.  They have not been able to get out of their home safely due to ice/snow.  PT reminded them of next scheduled appt 08/03/2024 at 8:45 am.  Greig Anon, PT 07/29/24 10:01 AM Phone: [phone] Fax: [phone]

## 2024-08-03 ENCOUNTER — Ambulatory Visit
